# Patient Record
Sex: Male | Born: 1950 | Race: White | Hispanic: No | Marital: Married | Smoking: Former smoker
Health system: Southern US, Community
[De-identification: ages and names within clinical notes are randomized; demographics above are authoritative.]

## PROBLEM LIST (undated history)

## (undated) DIAGNOSIS — G4730 Sleep apnea, unspecified: Secondary | ICD-10-CM

## (undated) DIAGNOSIS — D490 Neoplasm of unspecified behavior of digestive system: Secondary | ICD-10-CM

## (undated) DIAGNOSIS — Z9889 Other specified postprocedural states: Secondary | ICD-10-CM

## (undated) DIAGNOSIS — M1990 Unspecified osteoarthritis, unspecified site: Secondary | ICD-10-CM

## (undated) DIAGNOSIS — I10 Essential (primary) hypertension: Secondary | ICD-10-CM

## (undated) DIAGNOSIS — R51 Headache: Secondary | ICD-10-CM

## (undated) DIAGNOSIS — C801 Malignant (primary) neoplasm, unspecified: Secondary | ICD-10-CM

## (undated) DIAGNOSIS — R112 Nausea with vomiting, unspecified: Secondary | ICD-10-CM

## (undated) DIAGNOSIS — Z8719 Personal history of other diseases of the digestive system: Secondary | ICD-10-CM

## (undated) DIAGNOSIS — B029 Zoster without complications: Secondary | ICD-10-CM

## (undated) DIAGNOSIS — F419 Anxiety disorder, unspecified: Secondary | ICD-10-CM

## (undated) DIAGNOSIS — IMO0001 Reserved for inherently not codable concepts without codable children: Secondary | ICD-10-CM

## (undated) DIAGNOSIS — Z5189 Encounter for other specified aftercare: Secondary | ICD-10-CM

## (undated) DIAGNOSIS — E7800 Pure hypercholesterolemia, unspecified: Secondary | ICD-10-CM

## (undated) DIAGNOSIS — I719 Aortic aneurysm of unspecified site, without rupture: Secondary | ICD-10-CM

## (undated) DIAGNOSIS — K2270 Barrett's esophagus without dysplasia: Secondary | ICD-10-CM

## (undated) DIAGNOSIS — I2510 Atherosclerotic heart disease of native coronary artery without angina pectoris: Secondary | ICD-10-CM

## (undated) DIAGNOSIS — K219 Gastro-esophageal reflux disease without esophagitis: Secondary | ICD-10-CM

## (undated) HISTORY — PX: REVISION TOTAL HIP ARTHROPLASTY: SHX766

## (undated) HISTORY — PX: ABDOMINAL SURGERY: SHX537

## (undated) HISTORY — PX: CHEST SURGERY: SHX595

## (undated) HISTORY — PX: SHOULDER ARTHROSCOPY: SHX128

## (undated) HISTORY — PX: MELANOMA EXCISION: SHX5266

## (undated) HISTORY — PX: KNEE ARTHROSCOPY: SUR90

## (undated) HISTORY — PX: HERNIA REPAIR: SHX51

## (undated) HISTORY — PX: LEG SURGERY: SHX1003

## (undated) HISTORY — PX: THYROID LOBECTOMY: SHX420

## (undated) HISTORY — PX: HIATAL HERNIA REPAIR: SHX195

## (undated) HISTORY — PX: SPINAL CORD STIMULATOR IMPLANT: SHX2422

## (undated) HISTORY — PX: BACK SURGERY: SHX140

## (undated) HISTORY — PX: LITHOTRIPSY: SUR834

## (undated) HISTORY — PX: HEMORRHOID SURGERY: SHX153

## (undated) HISTORY — PX: OTHER SURGICAL HISTORY: SHX169

## (undated) HISTORY — PX: KNEE ARTHROPLASTY: SHX992

## (undated) HISTORY — PX: JOINT REPLACEMENT: SHX530

---

## 2000-08-13 ENCOUNTER — Inpatient Hospital Stay (HOSPITAL_COMMUNITY): Admission: RE | Admit: 2000-08-13 | Discharge: 2000-08-17 | Payer: Self-pay | Admitting: Neurosurgery

## 2000-09-18 ENCOUNTER — Encounter: Admission: RE | Admit: 2000-09-18 | Discharge: 2000-09-18 | Payer: Self-pay | Admitting: Neurosurgery

## 2000-11-20 ENCOUNTER — Encounter: Admission: RE | Admit: 2000-11-20 | Discharge: 2000-11-20 | Payer: Self-pay | Admitting: Neurosurgery

## 2001-02-23 ENCOUNTER — Encounter: Admission: RE | Admit: 2001-02-23 | Discharge: 2001-02-23 | Payer: Self-pay | Admitting: Neurosurgery

## 2001-03-08 ENCOUNTER — Ambulatory Visit (HOSPITAL_COMMUNITY): Admission: RE | Admit: 2001-03-08 | Discharge: 2001-03-08 | Payer: Self-pay | Admitting: Neurosurgery

## 2001-04-18 ENCOUNTER — Emergency Department (HOSPITAL_COMMUNITY): Admission: EM | Admit: 2001-04-18 | Discharge: 2001-04-18 | Payer: Self-pay | Admitting: Emergency Medicine

## 2001-09-06 ENCOUNTER — Ambulatory Visit (HOSPITAL_COMMUNITY): Admission: RE | Admit: 2001-09-06 | Discharge: 2001-09-06 | Payer: Self-pay | Admitting: Internal Medicine

## 2002-10-07 ENCOUNTER — Inpatient Hospital Stay (HOSPITAL_COMMUNITY): Admission: AC | Admit: 2002-10-07 | Discharge: 2002-10-19 | Payer: Self-pay

## 2002-10-07 ENCOUNTER — Encounter: Payer: Self-pay | Admitting: *Deleted

## 2002-10-08 ENCOUNTER — Encounter: Payer: Self-pay | Admitting: Family Medicine

## 2002-10-08 ENCOUNTER — Encounter: Payer: Self-pay | Admitting: General Surgery

## 2002-10-08 ENCOUNTER — Encounter: Payer: Self-pay | Admitting: Psychology

## 2002-10-09 ENCOUNTER — Encounter: Payer: Self-pay | Admitting: General Surgery

## 2004-04-03 ENCOUNTER — Ambulatory Visit: Payer: Self-pay | Admitting: Internal Medicine

## 2004-04-03 ENCOUNTER — Ambulatory Visit (HOSPITAL_COMMUNITY): Admission: RE | Admit: 2004-04-03 | Discharge: 2004-04-03 | Payer: Self-pay | Admitting: Internal Medicine

## 2004-08-06 ENCOUNTER — Ambulatory Visit (HOSPITAL_COMMUNITY): Admission: RE | Admit: 2004-08-06 | Discharge: 2004-08-06 | Payer: Self-pay | Admitting: Family Medicine

## 2004-10-31 ENCOUNTER — Inpatient Hospital Stay (HOSPITAL_COMMUNITY): Admission: RE | Admit: 2004-10-31 | Discharge: 2004-11-05 | Payer: Self-pay | Admitting: Neurosurgery

## 2005-04-29 ENCOUNTER — Encounter (HOSPITAL_COMMUNITY): Admission: RE | Admit: 2005-04-29 | Discharge: 2005-05-29 | Payer: Self-pay | Admitting: Neurosurgery

## 2005-06-02 DIAGNOSIS — C801 Malignant (primary) neoplasm, unspecified: Secondary | ICD-10-CM

## 2005-06-02 HISTORY — DX: Malignant (primary) neoplasm, unspecified: C80.1

## 2005-06-04 ENCOUNTER — Encounter (HOSPITAL_COMMUNITY): Admission: RE | Admit: 2005-06-04 | Discharge: 2005-07-04 | Payer: Self-pay | Admitting: Neurosurgery

## 2005-07-24 ENCOUNTER — Ambulatory Visit: Payer: Self-pay | Admitting: Internal Medicine

## 2005-08-15 ENCOUNTER — Encounter (INDEPENDENT_AMBULATORY_CARE_PROVIDER_SITE_OTHER): Payer: Self-pay | Admitting: *Deleted

## 2005-08-15 ENCOUNTER — Ambulatory Visit: Payer: Self-pay | Admitting: Internal Medicine

## 2005-08-15 ENCOUNTER — Ambulatory Visit (HOSPITAL_COMMUNITY): Admission: RE | Admit: 2005-08-15 | Discharge: 2005-08-15 | Payer: Self-pay | Admitting: Internal Medicine

## 2005-09-26 ENCOUNTER — Ambulatory Visit (HOSPITAL_COMMUNITY): Admission: RE | Admit: 2005-09-26 | Discharge: 2005-09-26 | Payer: Self-pay | Admitting: Thoracic Surgery

## 2005-10-01 ENCOUNTER — Ambulatory Visit (HOSPITAL_COMMUNITY): Admission: RE | Admit: 2005-10-01 | Discharge: 2005-10-01 | Payer: Self-pay | Admitting: Thoracic Surgery

## 2005-10-21 ENCOUNTER — Inpatient Hospital Stay (HOSPITAL_COMMUNITY): Admission: RE | Admit: 2005-10-21 | Discharge: 2005-10-25 | Payer: Self-pay | Admitting: Thoracic Surgery

## 2005-10-21 ENCOUNTER — Encounter (INDEPENDENT_AMBULATORY_CARE_PROVIDER_SITE_OTHER): Payer: Self-pay | Admitting: Specialist

## 2005-11-05 ENCOUNTER — Encounter: Admission: RE | Admit: 2005-11-05 | Discharge: 2005-11-05 | Payer: Self-pay | Admitting: Thoracic Surgery

## 2005-11-19 ENCOUNTER — Encounter: Admission: RE | Admit: 2005-11-19 | Discharge: 2005-11-19 | Payer: Self-pay | Admitting: Thoracic Surgery

## 2005-12-18 ENCOUNTER — Inpatient Hospital Stay (HOSPITAL_COMMUNITY): Admission: RE | Admit: 2005-12-18 | Discharge: 2005-12-22 | Payer: Self-pay | Admitting: Neurosurgery

## 2006-02-17 ENCOUNTER — Encounter: Admission: RE | Admit: 2006-02-17 | Discharge: 2006-02-17 | Payer: Self-pay | Admitting: Thoracic Surgery

## 2006-03-11 ENCOUNTER — Ambulatory Visit: Payer: Self-pay | Admitting: Internal Medicine

## 2006-04-30 ENCOUNTER — Ambulatory Visit: Payer: Self-pay | Admitting: Internal Medicine

## 2006-05-01 ENCOUNTER — Ambulatory Visit (HOSPITAL_COMMUNITY): Admission: RE | Admit: 2006-05-01 | Discharge: 2006-05-01 | Payer: Self-pay | Admitting: Neurosurgery

## 2006-08-03 ENCOUNTER — Inpatient Hospital Stay (HOSPITAL_COMMUNITY): Admission: RE | Admit: 2006-08-03 | Discharge: 2006-08-05 | Payer: Self-pay | Admitting: Orthopedic Surgery

## 2007-03-20 ENCOUNTER — Ambulatory Visit (HOSPITAL_COMMUNITY): Admission: RE | Admit: 2007-03-20 | Discharge: 2007-03-20 | Payer: Self-pay | Admitting: Neurosurgery

## 2007-04-04 ENCOUNTER — Encounter: Admission: RE | Admit: 2007-04-04 | Discharge: 2007-04-04 | Payer: Self-pay | Admitting: Neurosurgery

## 2007-05-04 ENCOUNTER — Encounter (HOSPITAL_COMMUNITY): Admission: RE | Admit: 2007-05-04 | Discharge: 2007-06-02 | Payer: Self-pay | Admitting: Neurosurgery

## 2007-05-17 ENCOUNTER — Ambulatory Visit (HOSPITAL_COMMUNITY): Admission: RE | Admit: 2007-05-17 | Discharge: 2007-05-17 | Payer: Self-pay | Admitting: Neurosurgery

## 2007-06-01 ENCOUNTER — Ambulatory Visit: Payer: Self-pay | Admitting: Thoracic Surgery

## 2007-06-01 ENCOUNTER — Encounter: Admission: RE | Admit: 2007-06-01 | Discharge: 2007-06-01 | Payer: Self-pay | Admitting: Thoracic Surgery

## 2007-07-07 ENCOUNTER — Ambulatory Visit (HOSPITAL_COMMUNITY): Admission: RE | Admit: 2007-07-07 | Discharge: 2007-07-12 | Payer: Self-pay | Admitting: Neurosurgery

## 2007-07-21 ENCOUNTER — Ambulatory Visit: Payer: Self-pay | Admitting: Thoracic Surgery

## 2007-08-25 ENCOUNTER — Ambulatory Visit: Payer: Self-pay | Admitting: Thoracic Surgery

## 2007-09-06 ENCOUNTER — Ambulatory Visit: Payer: Self-pay | Admitting: Thoracic Surgery

## 2007-09-06 ENCOUNTER — Inpatient Hospital Stay (HOSPITAL_COMMUNITY): Admission: RE | Admit: 2007-09-06 | Discharge: 2007-09-09 | Payer: Self-pay | Admitting: Thoracic Surgery

## 2007-09-16 ENCOUNTER — Ambulatory Visit: Payer: Self-pay | Admitting: Thoracic Surgery

## 2007-09-16 ENCOUNTER — Encounter: Admission: RE | Admit: 2007-09-16 | Discharge: 2007-09-16 | Payer: Self-pay | Admitting: Thoracic Surgery

## 2007-09-28 ENCOUNTER — Encounter: Admission: RE | Admit: 2007-09-28 | Discharge: 2007-09-28 | Payer: Self-pay | Admitting: Thoracic Surgery

## 2007-09-28 ENCOUNTER — Ambulatory Visit: Payer: Self-pay | Admitting: Thoracic Surgery

## 2007-10-29 ENCOUNTER — Emergency Department (HOSPITAL_COMMUNITY): Admission: EM | Admit: 2007-10-29 | Discharge: 2007-10-29 | Payer: Self-pay | Admitting: Emergency Medicine

## 2007-11-03 ENCOUNTER — Ambulatory Visit (HOSPITAL_COMMUNITY): Admission: RE | Admit: 2007-11-03 | Discharge: 2007-11-03 | Payer: Self-pay | Admitting: Urology

## 2007-11-05 ENCOUNTER — Ambulatory Visit (HOSPITAL_COMMUNITY): Admission: RE | Admit: 2007-11-05 | Discharge: 2007-11-05 | Payer: Self-pay | Admitting: Urology

## 2007-11-18 ENCOUNTER — Ambulatory Visit (HOSPITAL_COMMUNITY): Admission: RE | Admit: 2007-11-18 | Discharge: 2007-11-18 | Payer: Self-pay | Admitting: Urology

## 2007-12-02 ENCOUNTER — Ambulatory Visit: Payer: Self-pay | Admitting: Thoracic Surgery

## 2007-12-02 ENCOUNTER — Encounter: Admission: RE | Admit: 2007-12-02 | Discharge: 2007-12-02 | Payer: Self-pay | Admitting: Thoracic Surgery

## 2007-12-20 ENCOUNTER — Ambulatory Visit (HOSPITAL_COMMUNITY): Admission: RE | Admit: 2007-12-20 | Discharge: 2007-12-20 | Payer: Self-pay | Admitting: Urology

## 2009-03-18 ENCOUNTER — Encounter: Admission: RE | Admit: 2009-03-18 | Discharge: 2009-03-18 | Payer: Self-pay | Admitting: Orthopedic Surgery

## 2009-10-03 ENCOUNTER — Encounter: Payer: Self-pay | Admitting: Gastroenterology

## 2009-10-25 ENCOUNTER — Ambulatory Visit: Payer: Self-pay | Admitting: Thoracic Surgery

## 2010-03-21 ENCOUNTER — Ambulatory Visit (HOSPITAL_COMMUNITY): Admission: RE | Admit: 2010-03-21 | Discharge: 2010-03-22 | Payer: Self-pay | Admitting: Neurosurgery

## 2010-03-28 ENCOUNTER — Ambulatory Visit (HOSPITAL_COMMUNITY)
Admission: RE | Admit: 2010-03-28 | Discharge: 2010-03-29 | Payer: Self-pay | Source: Home / Self Care | Admitting: Neurosurgery

## 2010-03-31 ENCOUNTER — Ambulatory Visit: Payer: Self-pay | Admitting: Internal Medicine

## 2010-03-31 ENCOUNTER — Inpatient Hospital Stay (HOSPITAL_COMMUNITY)
Admission: EM | Admit: 2010-03-31 | Discharge: 2010-04-02 | Payer: Self-pay | Source: Home / Self Care | Admitting: Emergency Medicine

## 2010-06-02 HISTORY — PX: NECK SURGERY: SHX720

## 2010-06-23 ENCOUNTER — Encounter: Payer: Self-pay | Admitting: Neurosurgery

## 2010-06-23 ENCOUNTER — Encounter: Payer: Self-pay | Admitting: Thoracic Surgery

## 2010-06-24 ENCOUNTER — Encounter: Payer: Self-pay | Admitting: Thoracic Surgery

## 2010-07-04 NOTE — Letter (Signed)
Summary: Internal Other  Internal Other   Imported By: Peggyann Shoals 10/03/2009 10:22:03  _____________________________________________________________________  External Attachment:    Type:   Image     Comment:   External Document

## 2010-07-28 ENCOUNTER — Emergency Department (HOSPITAL_COMMUNITY): Payer: Medicare Other

## 2010-07-28 ENCOUNTER — Emergency Department (HOSPITAL_COMMUNITY)
Admission: EM | Admit: 2010-07-28 | Discharge: 2010-07-28 | Disposition: A | Discharge status: Home / Self Care | Payer: Medicare Other | Attending: Emergency Medicine | Admitting: Emergency Medicine

## 2010-07-28 DIAGNOSIS — Z79899 Other long term (current) drug therapy: Secondary | ICD-10-CM | POA: Insufficient documentation

## 2010-07-28 DIAGNOSIS — R0789 Other chest pain: Secondary | ICD-10-CM | POA: Insufficient documentation

## 2010-07-28 DIAGNOSIS — R112 Nausea with vomiting, unspecified: Secondary | ICD-10-CM | POA: Insufficient documentation

## 2010-07-28 DIAGNOSIS — I10 Essential (primary) hypertension: Secondary | ICD-10-CM | POA: Insufficient documentation

## 2010-07-28 DIAGNOSIS — R109 Unspecified abdominal pain: Secondary | ICD-10-CM | POA: Insufficient documentation

## 2010-07-28 LAB — DIFFERENTIAL
Basophils Absolute: 0 10*3/uL (ref 0.0–0.1)
Basophils Relative: 0 % (ref 0–1)
Eosinophils Absolute: 0.1 10*3/uL (ref 0.0–0.7)
Eosinophils Relative: 1 % (ref 0–5)
Lymphocytes Relative: 21 % (ref 12–46)
Lymphs Abs: 2.2 10*3/uL (ref 0.7–4.0)
Monocytes Absolute: 0.8 10*3/uL (ref 0.1–1.0)
Monocytes Relative: 8 % (ref 3–12)
Neutro Abs: 7.5 10*3/uL (ref 1.7–7.7)
Neutrophils Relative %: 71 % (ref 43–77)

## 2010-07-28 LAB — POCT CARDIAC MARKERS
CKMB, poc: 1.3 ng/mL (ref 1.0–8.0)
CKMB, poc: 2.2 ng/mL (ref 1.0–8.0)
CKMB, poc: 1.1 ng/mL (ref 1.0–8.0)
Myoglobin, poc: 131 ng/mL (ref 12–200)
Myoglobin, poc: 153 ng/mL (ref 12–200)
Myoglobin, poc: 122 ng/mL (ref 12–200)
Troponin i, poc: 0.08 ng/mL (ref 0.00–0.09)
Troponin i, poc: <0.05 ng/mL (ref 0.00–0.09)
Troponin i, poc: <0.05 ng/mL (ref 0.00–0.09)

## 2010-07-28 LAB — BASIC METABOLIC PANEL
BUN: 12 mg/dL (ref 6–23)
CO2: 26 mEq/L (ref 19–32)
Calcium: 9.4 mg/dL (ref 8.4–10.5)
Chloride: 103 mEq/L (ref 96–112)
Creatinine, Ser: 1.18 mg/dL (ref 0.4–1.5)
GFR calc Af Amer: >60 mL/min (ref >60)
GFR calc non Af Amer: >60 mL/min (ref >60)
Glucose, Bld: 108 mg/dL — ABNORMAL HIGH (ref 70–99)
Potassium: 4 mEq/L (ref 3.5–5.1)
Sodium: 138 mEq/L (ref 135–145)

## 2010-07-28 LAB — CBC
HCT: 45.8 % (ref 39.0–52.0)
Hemoglobin: 15.2 g/dL (ref 13.0–17.0)
MCH: 29.5 pg (ref 26.0–34.0)
MCHC: 33.2 g/dL (ref 30.0–36.0)
MCV: 88.9 fL (ref 78.0–100.0)
Platelets: 323 10*3/uL (ref 150–400)
RBC: 5.15 MIL/uL (ref 4.22–5.81)
RDW: 13.9 % (ref 11.5–15.5)
WBC: 10.6 10*3/uL — ABNORMAL HIGH (ref 4.0–10.5)

## 2010-08-14 LAB — BASIC METABOLIC PANEL
BUN: 13 mg/dL (ref 6–23)
BUN: 15 mg/dL (ref 6–23)
BUN: 16 mg/dL (ref 6–23)
BUN: 24 mg/dL — ABNORMAL HIGH (ref 6–23)
CO2: 25 mEq/L (ref 19–32)
CO2: 27 mEq/L (ref 19–32)
CO2: 27 mEq/L (ref 19–32)
CO2: 29 mEq/L (ref 19–32)
Calcium: 8.6 mg/dL (ref 8.4–10.5)
Calcium: 9.1 mg/dL (ref 8.4–10.5)
Calcium: 9.3 mg/dL (ref 8.4–10.5)
Calcium: 9.4 mg/dL (ref 8.4–10.5)
Chloride: 105 mEq/L (ref 96–112)
Chloride: 105 mEq/L (ref 96–112)
Chloride: 109 mEq/L (ref 96–112)
Chloride: 110 mEq/L (ref 96–112)
Creatinine, Ser: 0.91 mg/dL (ref 0.4–1.5)
Creatinine, Ser: 0.99 mg/dL (ref 0.4–1.5)
Creatinine, Ser: 1.09 mg/dL (ref 0.4–1.5)
Creatinine, Ser: 1.24 mg/dL (ref 0.4–1.5)
GFR calc Af Amer: >60 mL/min (ref >60)
GFR calc Af Amer: >60 mL/min (ref >60)
GFR calc Af Amer: >60 mL/min (ref >60)
GFR calc Af Amer: >60 mL/min (ref >60)
GFR calc non Af Amer: 60 mL/min — ABNORMAL LOW (ref >60)
GFR calc non Af Amer: >60 mL/min (ref >60)
GFR calc non Af Amer: >60 mL/min (ref >60)
GFR calc non Af Amer: >60 mL/min (ref >60)
Glucose, Bld: 117 mg/dL — ABNORMAL HIGH (ref 70–99)
Glucose, Bld: 89 mg/dL (ref 70–99)
Glucose, Bld: 92 mg/dL (ref 70–99)
Glucose, Bld: 94 mg/dL (ref 70–99)
Potassium: 3.7 mEq/L (ref 3.5–5.1)
Potassium: 3.8 mEq/L (ref 3.5–5.1)
Potassium: 4.4 mEq/L (ref 3.5–5.1)
Potassium: 4.5 mEq/L (ref 3.5–5.1)
Sodium: 139 mEq/L (ref 135–145)
Sodium: 140 mEq/L (ref 135–145)
Sodium: 141 mEq/L (ref 135–145)
Sodium: 141 mEq/L (ref 135–145)

## 2010-08-14 LAB — CBC
HCT: 39.2 % (ref 39.0–52.0)
HCT: 45 % (ref 39.0–52.0)
HCT: 45.3 % (ref 39.0–52.0)
HCT: 46.1 % (ref 39.0–52.0)
Hemoglobin: 12.6 g/dL — ABNORMAL LOW (ref 13.0–17.0)
Hemoglobin: 15.1 g/dL (ref 13.0–17.0)
Hemoglobin: 15.4 g/dL (ref 13.0–17.0)
Hemoglobin: 15.7 g/dL (ref 13.0–17.0)
MCH: 29.6 pg (ref 26.0–34.0)
MCH: 30.3 pg (ref 26.0–34.0)
MCH: 30.6 pg (ref 26.0–34.0)
MCH: 31.1 pg (ref 26.0–34.0)
MCHC: 32.7 g/dL (ref 30.0–36.0)
MCHC: 33.6 g/dL (ref 30.0–36.0)
MCHC: 34 g/dL (ref 30.0–36.0)
MCHC: 34.1 g/dL (ref 30.0–36.0)
MCV: 89.9 fL (ref 78.0–100.0)
MCV: 90.2 fL (ref 78.0–100.0)
MCV: 90.5 fL (ref 78.0–100.0)
MCV: 91.3 fL (ref 78.0–100.0)
Platelets: 211 10*3/uL (ref 150–400)
Platelets: 218 10*3/uL (ref 150–400)
Platelets: 218 10*3/uL (ref 150–400)
Platelets: 221 10*3/uL (ref 150–400)
RBC: 4.33 MIL/uL (ref 4.22–5.81)
RBC: 4.99 MIL/uL (ref 4.22–5.81)
RBC: 5.04 MIL/uL (ref 4.22–5.81)
RBC: 5.05 MIL/uL (ref 4.22–5.81)
RDW: 13.3 % (ref 11.5–15.5)
RDW: 13.4 % (ref 11.5–15.5)
RDW: 13.5 % (ref 11.5–15.5)
RDW: 13.6 % (ref 11.5–15.5)
WBC: 10 10*3/uL (ref 4.0–10.5)
WBC: 8.5 10*3/uL (ref 4.0–10.5)
WBC: 8.6 10*3/uL (ref 4.0–10.5)
WBC: 9.7 10*3/uL (ref 4.0–10.5)

## 2010-08-14 LAB — DIFFERENTIAL
Basophils Absolute: 0 10*3/uL (ref 0.0–0.1)
Basophils Relative: 0 % (ref 0–1)
Eosinophils Absolute: 0.1 10*3/uL (ref 0.0–0.7)
Eosinophils Relative: 1 % (ref 0–5)
Lymphocytes Relative: 21 % (ref 12–46)
Lymphs Abs: 2.1 10*3/uL (ref 0.7–4.0)
Monocytes Absolute: 0.9 10*3/uL (ref 0.1–1.0)
Monocytes Relative: 10 % (ref 3–12)
Neutro Abs: 6.5 10*3/uL (ref 1.7–7.7)
Neutrophils Relative %: 67 % (ref 43–77)

## 2010-08-14 LAB — SURGICAL PCR SCREEN
MRSA, PCR: NEGATIVE
Staphylococcus aureus: NEGATIVE

## 2010-08-14 LAB — D-DIMER, QUANTITATIVE: D-Dimer, Quant: 0.96 ug/mL-FEU — ABNORMAL HIGH (ref 0.00–0.48)

## 2010-08-14 LAB — LIPASE, BLOOD: Lipase: 30 U/L (ref 11–59)
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
## 2010-10-15 NOTE — Assessment & Plan Note (Signed)
OFFICE VISIT   [name], [name]  DOB:  [birth year]                                        June 01, 2007  CHART #:  [id number]   [name] came today and was concerned about a questionable incisional  hernia in his thoracotomy incision.  On examination over the triangle of  auscultation, when he coughs you can see some bulging of the soft  tissues, so he probably has some separations of the rib in this area but  I assured him that this does not seem to be a major problem and nothing  would need to be done now or probably in the future.  His chest x-ray  was stable.  It shows some old calcification of the ribs.  He has had  previous back surgery.  His blood pressure is 144/92, pulse 100,  respirations 18, saturations are 94%.  We will see him again in 3 months  just to check this again.   Ines Bloomer, M.D.  Electronically Signed   DPB/MEDQ  D:  06/01/2007  T:  06/01/2007  Job:  [id number]
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
## 2010-10-15 NOTE — Assessment & Plan Note (Signed)
OFFICE VISIT   [name], [name]  DOB:  Oct 12, 1950                                        September 28, 2007  CHART #:  [id number]   Mr. Gaster came for followup today. His incision is well healed.   His blood pressure is 131/82, pulse 87, respirations 18, saturations  93%.  Lungs were clear to auscultation and percussion. There was no  evidence of recurrence of his lung hernia.   He will be seeing Dr. Karilyn Cota regarding his chronic diarrhea problem.  From my standpoint, he is doing well. I will see him back again in two  months with a chest x-ray.   Ines Bloomer, M.D.  Electronically Signed   DPB/MEDQ  D:  09/28/2007  T:  09/28/2007  Job:  [id number]
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
## 2010-10-15 NOTE — Assessment & Plan Note (Signed)
OFFICE VISIT   LUX, MEADERS  DOB:  02-Jan-1951                                        September 16, 2007  CHART #:  [id number]   Mr. Hantz returns today for followup.  He is status post repair of a  left incisional lung hernia on September 06, 2007.  He had an uneventful  postoperative course and was discharged home on postop day #4.  Since  returning home, however, he has developed significant abdominal  cramping, bloating and diarrhea.  He has also had some associated nausea  and vomiting, which has been so bad that he sought attention at Dr.  Inge Rise office, where they gave him some type of injection for the  nausea.  He has not had fever.  He has had problems with GI troubles in  the past and has been seen by Dr. Dionicia Abler and apparently is awaiting a  call from their office to schedule an  appointment for evaluation of the  current situation.  From a pulmonary standpoint, he is doing very well.  His breathing is stable and he has had very little incisional  discomfort.   PHYSICAL EXAMINATION:  VITAL SIGNS:  Blood pressure 98/60, respirations  18, O2 sat 94%.  SKIN:  His left incision is healing well with no evidence of recurrence  of his hernia.  LUNGS:  Clear to auscultation.  ABDOMEN:  Soft with active bowel sounds.  He is somewhat distended.   Chest x-ray shows no acute changes.   ASSESSMENT/PLAN:  Mr. Southgate is doing well status post a left incisional  lung hernia repair.  He was seen by Dr. Edwyna Shell today and in light of his  current GI symptoms was given a prescription for 10 days' worth of  Flagyl to cover for C. diff.  He is asked to follow up as  scheduled with Dr. Dionicia Abler, and we will see him back here in our office in  2 weeks with a chest x-ray.   Ines Bloomer, M.D.  Electronically Signed   GC/MEDQ  D:  09/16/2007  T:  09/16/2007  Job:  [id number]   cc:   Dr. Jennelle Human, M.D.

## 2010-10-15 NOTE — Assessment & Plan Note (Signed)
OFFICE VISIT   CAMP, GOPAL  DOB:  10/09/1950                                        August 25, 2007  CHART #:  [id number]   [name] came today and it looks like his lung hernia is the same or  possibly increased in size.  His blood pressure is 132/93, pulse 83,  respirations 18, sats were 96%.  He says when he has nausea or vomiting  or coughs from his sinsuses, it causes him to have a lot of pain.  He  has also had 5 previous back surgeries and is apparently applying for  disability.   On examination there is a definite herniation between the ribs right  near the triangle of auscultation.  Because he is symptomatic and would  like to have this repaired, we will schedule this for September 06, 2007.  Will reopen the incision and stabilize the ribs and probably close the  hernia with some type of Gore-Tex or absorbable material.   Ines Bloomer, M.D.  Electronically Signed   DPB/MEDQ  D:  08/25/2007  T:  08/25/2007  Job:  [id number]

## 2010-10-15 NOTE — Op Note (Signed)
[name], [name] NO.:  [id number]   MEDICAL RECORD NO.:  [id number]          PATIENT TYPE:  INP   LOCATION:  2550                         FACILITY:  [facility]   PHYSICIAN:  Ines Bloomer, M.D. DATE OF BIRTH:  [birth year]   DATE OF PROCEDURE:  DATE OF DISCHARGE:                               OPERATIVE REPORT   PREOPERATIVE DIAGNOSIS:  Left incisional lung hernia   POSTOPERATIVE DIAGNOSIS:  Left incisional lung hernia   OPERATION PERFORMED:  Repair of the left incisional lung hernia.   SURGEON:  Dr. Patricia Nettle. Burney.   FIRST ASSISTANT:  Kristen Dominick PA-C.   ANESTHESIA:  General anesthesia.   DESCRIPTION OF PROCEDURE:  After general anesthesia, the patient was  turned to the left lateral thoracotomy position and prepped and draped  in the usual sterile manner.  The previous thoracotomy incision was  opened, approximately two-thirds of it in the middle portion of it and  dissection was carried down to the latissimus, which was very thinned  out and it was divided.  The serratus was reflected anteriorly entering  into the fifth intercostal space which was widened, and you could see  the obvious the lung hernia there.  The space was about 3 cm.  It was  widened about 3 cm, so when the patient coughed you could see the hernia  easily.  We dissected up the excess tissue from the hernia sac and then  closed the intercostal space with #2 Prolene using six of them in an  interrupted fashion.  We use of Bailey rib approximator to reapproximate  the ribs and this closed the interspace down from 3 cm down to about  three-fourths to 1 cm in length in size.  Then, we reinforced this  interspace with a 1 mm Gore-Tex patch placed in the area of the hernia  and sutured it to the intercostal muscle superiorly and inferiorly with  0 Prolene in an interrupted fashion.  After this had been done, the  triangle of auscultation was closed with interrupted #1 Vicryls and then  the  platysmas was reapproximated with interrupted #1 Vicryl, 2-0 Vicryl  in the subcutaneous tissue and Dermabond for the skin.  The patient  returned to the recovery room in stable condition.      Ines Bloomer, M.D.  Electronically Signed     DPB/MEDQ  D:  09/06/2007  T:  09/06/2007  Job:  [id number]

## 2010-10-15 NOTE — H&P (Signed)
[name], [name]                ACCOUNT NO.:  [id number]   MEDICAL RECORD NO.:  [id number]          PATIENT TYPE:  AMB   LOCATION:  DAY                           FACILITY:  [facility]   PHYSICIAN:  Dennie Maizes, M.D.   DATE OF BIRTH:  [birth year]   DATE OF ADMISSION:  11/03/2007  DATE OF DISCHARGE:  LH                              HISTORY & PHYSICAL   CHIEF COMPLAINT:  Severe right flank pain radiating to the front, right  distal ureteral calculus with obstruction.   HISTORY OF PRESENT ILLNESS:  This 60 year old male has a history of  urolithiasis in the past.  He had right flank pain about 4 years ago.  He experienced onset of severe right flank pain radiating to the front  about a week ago.  He went to the emergency room at [facility]  on Oct 29, 2007.  Evaluation was done with a noncontrast CT scan of the  abdomen and pelvis.  This revealed a small liver hemangioma.  The  patient had mild hydronephrosis due to an obstructing stone in the  distal ureter, measuring 10 x 6 x 6 mm in size.  The patient is unable  to pass the stone.   He denied having any fever, chills, voiding difficulty or hematuria at  present.  He was brought to the short-stay center today for  extracorporeal shock wave lithotripsy of the right distal ureteral  calculus, which is causing obstruction.   PAST MEDICAL HISTORY:  1. History of urolithiasis.  2. Gastroesophageal reflux disease.  3. Status post fundoplication for hiatal hernia on several occasions.  4. Status post right thyroidectomy.  5. Status post multiple back surgeries.  6. History of Barrett's esophagus.  7. History of excision of melanoma.  8. Status post multiple neck surgeries.  9. Status post total knee replacement.  10.Status post incisional hernia repair of the chest.   MEDICATIONS:  1. Vitamin B.  2. Calcium with vitamin D.  3. Prevacid 30 mg two twice a day.  4. Singulair 10 mg p.o. daily.  5. Lisinopril 10 mg p.o.  daily.  6. Allegra 180 mg p.o. daily.  7. Dicyclomine 20 mg three times a day.  8. Tylox one or two p.r.n. for back pain.  9. Diazepam 5 mg one p.o. q.8 h. p.r.n. muscle spasms.  10.Metoclopramide 10 mg for nausea.  11.Amoxicillin 500 mg q.6 h. for prophylaxis for dental work.   ALLERGIES:  DEMEROL.   FAMILY HISTORY:  Positive for kidney stones, heart disease, COPD, hiatal  hernia, heart transplant.   EXAMINATION:  Height 6 foot 3 inches.  Weight 240 pounds.  HEENT:  Normal.  NECK:  No masses.  LUNGS:  Clear to auscultation.  HEART:  Regular rate and rhythm.  No murmurs.  ABDOMEN:  Soft.  No palpable flank mass.  Mild right costovertebral  angle tenderness is noted.  GU:  Penis and testes normal.  RECTAL:  Examination not done.   IMPRESSION:  Right distal ureteral calculus with obstruction, with about  10 x 6 x 6 mm right hydronephrosis, right renal colic.  PLAN:  Extracorporeal shockwave lithotripsy of  right distal ureteral  calculus with IV sedation in the [facility].  I discussed with  the patient and his wife regarding the diagnosis, operative details,  alternative treatments, outcome, possible risks and complications; and  he has agreed for the procedure to be done.      Dennie Maizes, M.D.  Electronically Signed     SK/MEDQ  D:  11/02/2007  T:  11/03/2007  Job:  [id number]   cc:   Kirk Ruths, M.D.  Fax: [phone]

## 2010-10-15 NOTE — Assessment & Plan Note (Signed)
OFFICE VISIT   [name], [name]  DOB:  13-Aug-1950                                        December 02, 2007  CHART #:  [id number]   The patient came for followup today.  He states he thought he had  another incisional hernia.  When we examined his wound, there was no  evidence of any hernia. When he does cough, there is some ballooning of  the soft tissues, but nothing like there is a recurrence of his hernia.  I did not palpate any separation in his ribs.  His blood pressure is  126/86, pulse 76, respirations 18, and sats were 95%.  Lungs were clear  to auscultation and percussion.  We will see him back again in 2 months  with another chest x-ray.   Ines Bloomer, M.Cochran.  Electronically Signed   DPB/MEDQ  Cochran:  12/02/2007  T:  12/02/2007  Job:  [id number]

## 2010-10-15 NOTE — Discharge Summary (Signed)
NAMECHRISTIAAN, STREBECK NO.:  [id number]   MEDICAL RECORD NO.:  [id number]          PATIENT TYPE:  INP   LOCATION:  3301                         FACILITY:  [facility]   PHYSICIAN:  Ines Bloomer, M.D. DATE OF BIRTH:  1951/03/29   DATE OF ADMISSION:  09/06/2007  DATE OF DISCHARGE:  09/09/2007                               DISCHARGE SUMMARY   The patient was considered to be ready for discharge home on September 08, 2007.  Unfortunately in the morning of September 08, 2007, postop day #2, the  patient was noted to complain of nausea and vomiting.  He does have a  history of nausea and vomiting, but that is worse today.  The patient  received Zofran as well as Reglan.  He was taken out of bed to ambulate  and was feeling better.  On postop day 2, the patient's nausea and  vomiting had improved.  His vital signs were stable.  The patient is  afebrile.  He is sating greater than 90% on room air.  He continues to  ambulate without difficulty.  He was tolerating his diet well.  His  incision are clean, dry, intact, and healing.   LABS:  Labs on postop day #3, white count 9.8, hemoglobin 13.7,  hematocrit 39.5, and platelet count 222.  Sodium of 130, potassium of  3.9, chloride of 104, bicarb of 27, BUN of 15, creatinine 1.10, and  glucose of 113.   The patient is felt to be stable and ready for discharge home on postop  day 3, September 09, 2007.  For details of the patient's followup appointment  and discharge instructions, please see dictated H and P.   DISCHARGE MEDICATIONS:  1. Prevacid 30 mg b.i.d.  2. Lisinopril 10 mg daily.  3. Allegra 180 mg daily.  4. Singulair 10 mg daily.  5. Fexofenadine 180 mg four times per day.  6. Diazepam 5 mg t.i.d. p.r.n.  7. Tylox 1-2 tablets q.4-6 h. p.r.n.  8. Promethazine p.r.n.  9. Calcium and vitamin D daily.  10.Phenergan Suppository one tablet every 8 hours p.r.n.  11.Phenergan 25 mg one tablet every 8 hours p.r.n.      Theda Belfast, [place]      Ines Bloomer, M.D.  Electronically Signed   KMD/MEDQ  D:  10/19/2007  T:  10/20/2007  Job:  [id number]   cc:   Ines Bloomer, M.D.

## 2010-10-18 NOTE — Op Note (Signed)
NAMESHANKAR, SILBER NO.:  [id number]   MEDICAL RECORD NO.:  [id number]          PATIENT TYPE:  INP   LOCATION:  5029                         FACILITY:  [facility]   PHYSICIAN:  Mila Homer. Sherlean Foot, M.D. DATE OF BIRTH:  03/24/1951   DATE OF PROCEDURE:  08/03/2006  DATE OF DISCHARGE:                               OPERATIVE REPORT   SURGEON:  Mila Homer. Sherlean Foot, M.D.   ASSISTANT:  Richardean Canal, P.A.   PREOPERATIVE DIAGNOSIS:  Left knee post-traumatic arthritis.   POSTOPERATIVE DIAGNOSIS:  Left knee post-traumatic arthritis.   PROCEDURE:  Left total knee arthroplasty with revision of components on  the tibia (modifier 22 due to extraordinary complication due to the  prior hardware and need for revision components).   ANESTHESIA:  General.   COMPLICATIONS:  None.   DRAINS:  1 Hemovac, 1 pain catheter.   ESTIMATED BLOOD LOSS:  200 mL.   TOURNIQUET TIME:  1 hour 25 minutes.   DESCRIPTION OF PROCEDURE:  Informed consent was obtained.  The patient  was laid supine and administered general anesthesia.  A Foley catheter  was placed.  His left leg was prepped and draped in the usual sterile  fashion.  Preoperative range of motion was 0 to 125.  A midline incision  was made 4 cm away from the lateral incision distally, and further away  as we went up, and it was basically a straight parapatellar incision.  I  used a fresh blade to make a median  parapatellar arthrotomy.  I  performed synovectomy and elevated the deep MCL off the medial rest of  the tibia without going town to the pes tendons.  I then everted the  patella.  It measured 26 mm thick.  I reamed down 9 mm and then drilled  3 lug holes and accepted a 35-mm button and recreated 24-mm thickness.  We then went into flexion and I used the extramedullary alignment system  to make a perpendicular cut to the tibia.  I then used the  intramedullary guide to make a distal femoral cut at 6 degrees.  Then, I  drew out  the epicondylar axis; it measured 3 degrees.  I sized for a  size F, then through the 3-degree external rotation holes, I pinned the  4-in-1 cutting block into place and made the anterior, posterior and  chamfer cuts.  I then removed the block and placed a 10-mm spacer block  in the knee and had good flexion and extension gap balance.  I then  finished the femur with a size-F finishing block, drilling for the lugs  and cutting for the box.  I then went into the tibia with the  intramedullary canal finder and reamed up to 15 mm.  I then cut because  there were depressed levels of the lateral plateau and I wanted to get  beneath that.  At this point, a 17 spacer block then gave good flexion  and extension gap balance.  I then finished the tibia with a size-6  tibial tray drilled keel and trialed with the revision component with  a  15-mm stem.  The primary component on the femur was a size F, 17-mm  poly, 35-mm patella, and we had good flexion and extension gap balance,  trialed from 90 to 125 degrees and chose these components.  I then  copiously irrigated.  I then cemented in the components and snapped the  old polyethylene, located the knee and allowed the cement to harden in  extension.  I then cemented in the patella as well and removed all  excess cement.  I placed a Hemovac coming out superolaterally and deep  to the arthrotomy, and a pain catheter coming out superomedially and  superficial to the arthrotomy.  I closed the arthrotomy with figure-of-  eight #1 Vicryl sutures after the tourniquet was let down and hemostasis  was obtained.  I then closed the deep soft tissues with interrupted 0  Vicryl, subcuticular 2-0 Vicryl stitches and skin staples, Xeroform,  dressing sponges, sterile Webril and TED stockings..           ______________________________  Mila Homer Sherlean Foot, M.D.     SDL/MEDQ  D:  08/03/2006  T:  08/03/2006  Job:  [id number]

## 2010-10-18 NOTE — Op Note (Signed)
[name], [name]                ACCOUNT NO.:  [id number]   MEDICAL RECORD NO.:  [id number]          PATIENT TYPE:  AMB   LOCATION:  DAY                           FACILITY:  [facility]   PHYSICIAN:  [name], M.D.    DATE OF BIRTH:  [birth year]   DATE OF PROCEDURE:  08/15/2005  DATE OF DISCHARGE:                                 OPERATIVE REPORT   PROCEDURE:  Esophagogastroduodenoscopy and possible placement of Bravo  device duodenoscopy   INDICATIONS:  [name] is 60 year old Caucasian male with chronic GERD and a  short-segment Barrett's esophagus whose symptoms have not been well  controlled. His prior entire reflux surgery has undone spontaneously. He is  undergoing both diagnostic EGD and also EGD for surveillance purposes. If  his esophageal mucosa does not show evidence of esophagitis, has  spontaneously undone. He is undergoing diagnostic/surveillance EGD. If he  does not have endoscopic evidence of reflux esophagitis, will place Bravo  device for pH monitoring. He has been off PPI for 7 days, although he has  been having intractable heartburn, nausea, and vomiting for the last 3 days.   Procedure and risks were reviewed with the patient and informed consent was  obtained. Procedure was performed under anesthesia as he is been intolerant  of conscious sedation.   PREOP/INTRAOPERATIVE MEDICINES:  Please see anesthesia record for details.   FINDINGS:  Procedure performed in OR. The patient was placed under  anesthesia and was intubated. He was left in supine position. Olympus  videoscope was passed via oropharynx without any difficulty into esophagus.   ESOPHAGUS:  Mucosa of proximal and middle segment was normal. Distally there  were 2 ulcers which were linear. Proximal margin of Barrett's/ulceration was  at 32 cm. GE junction was estimated to be 35 and hiatus was at 39-40 cm.  Pictures taken for the record. He had short segment Barrett's. There was  some mucosal edema and  nodularity just above the GE junction at 12 o'clock.  Biopsy was obtained from the ulcer and this nodular mucosa submitted in  separate containers on the way out.   STOMACH:  It was empty and distended very well insufflation. Folds of  proximal stomach were normal. Examination of mucosa at body, antrum, pyloric  channel, as well as angularis, fundus and cardia was normal. There was no  wrap to be seen. There was virtually no fundal wrap.   DUODENUM:  Bulbar mucosa was normal. Scope was passed second part of the  duodenum where mucosa and folds were normal. Endoscope was withdrawn. The  patient tolerated the procedure well. He is in the process of being  extubated.   RECOMMENDATIONS:  1.  He will go back on Prevacid 30 mg p.o. b.i.d..  2.  I will call him with biopsy results. Since his symptoms have never been      controlled to his satisfaction  3.  Once histology is available for review, will consider referral to a      tertiary center for consideration of redo anti reflux surgery. Given      endoscopic findings, I would  do not feel that he needs a pH study. I      will be glad to do preop esophageal manometry.      [name], M.D.  Electronically Signed     NR/MEDQ  D:  08/15/2005  T:  08/15/2005  Job:  [id number]   cc:   Kirk Ruths, M.D.  Fax: [phone]
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
## 2010-10-18 NOTE — Op Note (Signed)
NAMECLINT, [name] NO.:  [id number]   MEDICAL RECORD NO.:  [id number]          PATIENT TYPE:  INP   LOCATION:  3313                         FACILITY:  [facility]   PHYSICIAN:  Ines Bloomer, M.D. DATE OF BIRTH:  [birth year]   DATE OF PROCEDURE:  10/21/2005  DATE OF DISCHARGE:                                 OPERATIVE REPORT   PREOPERATIVE DIAGNOSIS:  Status post hiatal hernia repair with recurrent  reflux.   POSTOPERATIVE DIAGNOSIS:  Status post hiatal hernia repair with recurrent  reflux.   OPERATION PERFORMED:  Left thoracotomy, redo hiatal hernia repair with  Harvest Dark IV.   SURGEON:  Ines Bloomer, M.D.   FIRST ASSISTANT:  Pecola Leisure, PA-C.   ANESTHESIA:  General anesthesia.   This 60 year old patient approximately 10-12 years ago had had a previous  Nissen fundoplication which the study showed that it had completely  disrupted. There was marked distortion of the upper stomach. He is brought  to the operating room to do a redo repair to the left thoracotomy incision.   He was turned to the left lateral thoracotomy position and was prepped and  draped in the usual sterile manner. The patient had had right open reflux  with ulcerations and erosions despite continued PPIs. A dual-lumen tube was  inserted, incision was made over the sixth intercostal space and dissection  was carried down dividing the latissimus and reflecting the serratus  anteriorly entering the sixth intercostal space. The intercostal muscle was  taken down off the seventh rib and reflexed superiorly to protect the nerve.  Two __________ were placed at right angles and the inferior pulmonary  ligament was taken down with electrocautery. The pleura was opened and the  esophagus was dissected out looping both the vagus nerves right at the  inferior pulmonary ligament. It was then dissected superiorly up to the  aorta and then dissection started inferiorly down to the hiatus.  When we got  to the hiatus, there was a marked amount of scarring around the hiatus and  this had to be carefully dissected out, first dissecting it anteromedially  and identifying the right crura and then dissecting posterolaterally  identifying the left crura. The peritoneum was entered and the marked  adhesions and redo hernia sac was excised. We dissected posteriorly and were  able to dissect up the stomach. You could see where the previous Nissan had  been done and it was disrupted. You could not really even find any sutures  from the previous surgery but there was marked scarring of the upper portion  of the stomach which the adhesions were all freed up. Then we went  posteriorly and freed up both the left and the right crura. The right crura  was somewhat weak but there was enough muscle to place 5 #1 silk sutures  from the right to the left crura. These were then tagged. It was decided  that the best thing to do was to do a Belsey repair rather than a redo  Nissan. The first stitch was placed in the stomach approximately 1.5 to  2 cm  below the GE junction and brought up above the GE junction approximately a  centimeter to a centimeter and a half. The horizontal mattress sutures with  2-0 Prolene using the Baue bolster which was a pledget and one was placed  medially and laterally to approximate a approximately 240-270 more like at  270 wrapping of the esophagus. After these three sutures were in place, they  were tied down. When the second suture was placed starting in the esophagus  with a pledget then through the serosa of the stomach, the sutures had been  placed. The stomach was then placed below the diaphragm and the sutures were  then sutured to the diaphragm superiorly, medially and laterally and tied  down. Then the crural stitches were tied down checking to be sure that we  could still get a finger medially. After these had been all tied down, the  area was irrigated  copiously. An NG tube had been placed into the stomach. A  right angled chest tube was placed right up the diaphragm and a straight  chest tube superiorly through separate stab wounds. Four drill holes were  placed through the seventh rib and then the pericostal's were brought around  the superior rim. The chest was closed and the muscle layer was closed with  #1 Vicryl and 2-0 Vicryl in the subcutaneous tissue and Ethicon skin clips.  The patient was returned to the recovery room in stable condition.           ______________________________  Ines Bloomer, M.D.     DPB/MEDQ  D:  10/21/2005  T:  10/21/2005  Job:  [id number]   cc:   Adolph Pollack, M.D.  [address]  [place]  [place] [address]

## 2010-10-18 NOTE — Op Note (Signed)
[facility]  Patient:    [name], [name]                       MRN: [id number] Proc. Date: 08/13/00 Adm. Date:  [id number] Attending:  Tressie Stalker D                           Operative Report  BRIEF HISTORY:  The patient is a 60 year old white male, who has suffered from intractable back pain for several years.  He failed medical management and was worked up with a lumbar MRI, which demonstrated a recurrent disk herniation L4-5, as well as significant degenerative disk disease L4-5 and L5-S1.  I discussed the various treatment options with the patient, including doing nothing, continuing medical management and surgery.  The patient weighed the risks, benefits and alternatives to surgery and decided to proceed with two level lumbar decompression and fusion.  PREOPERATIVE DIAGNOSES:  L4-5 recurrent herniated nucleus pulposus, L4-5 and L5-S1 degenerative disk disease, spondylosis, spinal stenosis, lumbago, lumbar radiculopathy.  POSTOPERATIVE DIAGNOSES:  L4-5 recurrent herniated nucleus pulposus, L4-5 and L5-S1 degenerative disk disease, spondylosis, spinal stenosis, lumbago, lumbar radiculopathy.  PROCEDURES:  Redo L4-5 microdiskectomy, L4-5 and L5-S1 posterior lumbar interbody fusion with insertion of Synthes T-LIF cortical bone dowels, posterior segmental instrumentation L4 to S1 with [employer] 90-D titanium pedicle screws and rods, posterolateral arthrodesis with local morcellized autograft bone and cancellous allograft bone, as well as Grafton putty.  SURGEON:  Cristi Loron, M.D.  ASSISTANT:  Tanya Nones. Jeral Fruit, M.D.  ANESTHESIA:  General endotracheal.  ESTIMATED BLOOD LOSS:  500 cc.  SPECIMENS:  None.  COMPLICATIONS:  None.  DRAINS:  None.  DESCRIPTION OF PROCEDURE:  The patient was brought to the operating room by the anesthesia team, general endotracheal anesthesia was induced.  The patient was then turned to  the prone position on the chest rolls.  His lumbosacral region was then shaved and prepared with Betadine scrub and Betadine solution and sterile drapes were applied.  I then injected the area to be incised with Marcaine with epinephrine solution and used a scalpel to make an incision through the patients preexisting surgical scar.  I used electrocautery to dissect down to the thoracolumbar fascia.  I divided it bilaterally, performing a subperiosteal dissection, stripping the paraspinous musculature from the spinous process and lamina of L4, L5 and the upper sacrum.  I inserted the McCullough retractor for exposure.  I obtained an intraoperative radiograph to confirm my location.  I then used the high-speed drill to perform bilateral L4 and L5 laminotomies.  Of note, the patient had a previous laminotomy at L4 on the right and it was scarred down from the previous operation.  I widened the bilateral L4 and L5 laminotomies with the Kerrison punch removing the L4-5 and L5-S1 ligamentum flavum bilaterally decompressing the thecal sac, as well as the bilateral L5 and S1 nerve root.  I then freed up the thecal sac and the nerve roots from the epidural tissue and the scar tissue at the L4-5 on the right and then mobilized them medially with the DErrico retractor and then performed an aggressive diskectomy bilaterally at L4-5 and L5-S1.  I scraped the vertebral endplates at L4-5 and L5-S1 with the various curets, cleaned the soft tissue from the vertebral endplates.  I inserted the interbody spreader at L5-S1, distracted L5-S1.  I then turned my attention to the posterior lumbar  interbody fusion.  I inserted the 7 mm trial T-LIF trial spacer into the left L5-S1 interspace after carefully retracting the nerve roots out of harms way.  There was a good snug fit of the 7 mm trial spacer, I removed the spacer and then I inserted a 7 mm bone graft in a similar fashion under fluoroscopic guidance into  the L5-S1 interspace.  I tapped it into place and then I filled posterior to the cortical bone graft with cancellous allograft bone.  Having completed the posterior lumbar interbody fusion at L5-S1, I now repeat the procedure at the L4-5.  I, in a similar fashion, cleared the vertebral endplates at L4-5 of soft tissue using the curets.  I then carefully placed a 7 mm trial spacer at L4-5 on the left.  There was a good snug fit.  I then placed a 7 mm Synthes T-LIF bone graft into the left side at L4-5 after carefully retracting the nerve roots out of the way I tapped into place. Then, I filled in the interspace posterior to the bone graft with cancellous allograft bone completing the L4-5 and L5-S1 posterior lumbar interbody fusion.  I now turn my attention to the posterior segmental instrumentation.  I used electrocautery to expose the bilateral L4 and L5 transverse processes.  I then, under fluoroscopic guidance, used the high-speed drill to decorticate posterior to the bilateral L4, L5 and S1 pedicles.  I then cannulated the pedicles with the pedicle finder, then tapped the pedicles and inserted 6.75 x 45 mm pedicle screws bilaterally at L4, L5 and 6.75 x 40 mm pedicle screws bilaterally at S1.  The cannulation, tapping and insertion of the pedicle screws was done under fluoroscopic guidance.  I palpated about the bilateral L4, L5 and S1 pedicles and noted that the cortex was not breached and the nerve roots were not injured bilaterally.  I then bent the appropriately length titanium rod and connected the unilateral pedicle screws and secured the caps.  I then placed a cross-connector to connect the two rods and tightened the connections appropriately.  Having completed the posterior segmental instrumentation, I now turn my attention to the posterolateral arthrodesis.  I used the high-speed drill to clear soft tissue and decorticate the L4-5 and L5-S1 facet joint, as well as the L4  pars region and the bilateral L4 and L5 transverse processes.  I then laid a combination of local morcellized autograft bone, cancellous allograft  bone and Grafton putty to augment the posterolateral fusion.  I now inspected the thecal sac bilaterally at L4-5 and L5-S1.  I inspected the bilateral L4, L5 and S1 nerve roots and noted that they were well-decompressed.  I then removed the McCullough retractor and then reapproximated the patients thoracolumbar fascia with interrupted #1 Vicryl, and subcutaneous tissue with interrupted 2-0 Vicryl, and the skin with Steri-Strips and benzoin.  The wound was then coated with bacitracin ointment and sterile dressing applied.  The drapes were removed and the patient was subsequently returned to the supine position, where he was extubated by the anesthesia team and transported to the post anesthesia care unit in stable condition.  All sponge, instrument and needle counts were correct at the end of this case. DD:  08/13/00 TD:  08/13/00 Job: [id number]

## 2010-10-18 NOTE — Discharge Summary (Signed)
NAMEKALYAN, BARABAS NO.:  [id number]   MEDICAL RECORD NO.:  [id number]          PATIENT TYPE:  INP   LOCATION:  3313                         FACILITY:  [facility]   PHYSICIAN:  Ines Bloomer, M.D. DATE OF BIRTH:  1950/08/03   DATE OF ADMISSION:  10/21/2005  DATE OF DISCHARGE:  10/25/2005                                 DISCHARGE SUMMARY   PRIMARY DIAGNOSIS:  Status post hiatal hernia repair with recurrent reflex.   IN-HOSPITAL DIAGNOSIS:  Postoperative atrial fibrillation.   SECONDARY DIAGNOSES:  1.  History of hiatal hernia status post repair and a Nissen fundoplication      1987.  2.  Status post multiple back surgeries.  3.  History of melanoma with node dissection done four years ago.  4.  Status post multiple operations in left lower extremity.  5.  Hypertension.  6.  History of arthritis.  7.  History of Barrett's esophagus, minimal dysplasia, status post treatment      with several protein pump inhibitor.   ALLERGIES:  Allergic to DEMEROL.   IN-HOSPITAL OPERATIONS AND PROCEDURES:  Left thoracotomy with redo hiatal  hernia repair with Harvest Dark IV.   HISTORY AND PHYSICAL AND HOSPITAL COURSE:  The patient is a 60 year old  Caucasian male who had a hiatal hernia repair and a Nissen fundoplication in  1987.  He then had several incision hernias and infections with the last  incision hernia repair with Gore-Tex mesh.  He now returns with symptoms of  severe reflux.  Endoscopy by Dr. __________ showed intestinal metaplasia  with evidence of Barrett's esophagus with minimal dysplasia.  He has been  treated with several proton pump inhibitors.  He has had episodes of the  esophagus erosion, bleeding, melena and multiple endoscopies initially under  general anesthesia.  The patient was evaluated at [facility] and then later by Dr.  Lorin Picket at [facility].  He suggested that he have repeat repair using  thoracic approach.  He has had no manometer.  He  underwent repeat upper GI  that showed the Nissen had slipped with evidence of reflux.  The motility  appeared to be normal.  He was also found on CT scan to have a large  hemangioma that had possibly increased slightly in size since one several  years ago.  The patient was seen and evaluated by Dr. Edwyna Shell.  Dr. Edwyna Shell  discussed with the patient undergoing surgery and repair of his hiatal  hernia.  He discussed risks and benefits with the patient.  The patient  acknowledged understanding and agreed to proceed.  Surgery was scheduled for  Oct 21, 2005.   The patient was taken to the operating room Oct 21, 2005 where he underwent  left thoracotomy with redo hiatal hernia repair with Harvest Dark IV.  The  patient tolerated this procedure and was transferred up to the intensive  care unit in stable condition.  The patient's postoperative course was  pretty much unremarkable.  NG tube was discontinued postoperative day #1.  The patient was started on water at that time.  The patient tolerated  this  well with no nausea, vomiting or aspiration noted.  Postoperative day 2, the  patient was increased to full liquids.  The patient again tolerated this  well with no nausea, vomiting or aspiration noted.  The following day, the  patient was increased to softs and tolerated this well.  The patient was  then increased to regular diet and tolerated without difficulty.  Bowel  movements were within normal limits prior to discharge home.  The patient  did have a short episode of atrial fibrillation on postoperative day #2.  Lopressor was ordered.  The patient did convert back to normal sinus rhythm.  He remained in normal sinus rhythm the remainder of his hospital course.  The patient was out of bed and ambulating well.  His incisions were dry and  intact and healing well.  Chest tubes were discontinued in the normal  fashion.  The patient was able to be weaned off oxygen saturating greater  than 90% on  room air.  The patient's vital signs were monitored and seen to  be stable.  He was afebrile during the remainder of his postoperative  course.   The patient is tentatively ready for discharge home the next one to two  days.  Follow-up appointment was scheduled with Dr. Edwyna Shell for November 05, 2005  at 11:20 a.m.  The patient is to obtain a P&A and lateral chest x-ray one  hour prior to this appointment.  Mr. Matthis received instructions on diet,  activity level and incisional care.  He was told no driving until released  to do so, no lifting over 10 pounds.  The patient was told he is allowed to  shower, washing his incisions using soap and water.  He is to contact the  office if he develops any drainage or opening from any of his incision  sites.  The patient was told to ambulate three to four times per day,  progress as tolerated and to continue his breathing exercises.  He was  educated on diet to be low fat, low salt.  The patient acknowledges  understanding.   DISCHARGE MEDICATIONS:  1.  Ultram 50 mg 150 tablets q.4-6h. p.r.n. pain.  2.  Protonix 40 mg b.i.d.  3.  Lopressor 25 mg b.i.d.  4.  Allegra 80 mg daily.  5.  Lisinopril 5 mg daily.      Theda Belfast, PA    ______________________________  Ines Bloomer, M.D.    KMD/MEDQ  D:  10/24/2005  T:  10/25/2005  Job:  [id number]

## 2010-10-18 NOTE — H&P (Signed)
[name], [name] NO.:  [id number]   MEDICAL RECORD NO.:  [id number]            PATIENT TYPE:   LOCATION:                                 FACILITY:   PHYSICIAN:  [name], M.D.    DATE OF BIRTH:  23-Dec-1950   DATE OF ADMISSION:  03/25/2004  DATE OF DISCHARGE:  LH                                HISTORY & PHYSICAL   PRESENTING COMPLAINT:  Follow for GERD.  The patient has known Barrett's  esophagus.   HISTORY OF PRESENT ILLNESS:  [name] is a 60 year old Caucasian male patient of  Dr. Edison Cochran who was there for scheduled visit.  He was last seen in  January 2004.  At that time he was referred to [name] of [name]  for possible antireflux surgery.  He had EGD by Dr. Gwinda Cochran.  Subsequently  they tried to do a BS study but he was intolerant.  Therefore further care  was not continued.  Prior to that he was sent fo [name].  He ran into the same  problem.  They attempted EGD with cautious sedation.  He was not able to  tolerate that procedure and therefore no further evaluation was undertaken.  He is presently on Prevacid double dose.  He states he has heartburn  intermittently just about every week.  Most of the time it is mild.  Every  now and then it is intractable.  He also has intermittent nocturnal  regurgitation at least a couple of times a month.  He denies dysphagia or  hoarseness but has intermittent cough.  He denies melena, rectal bleeding.  His bowels are moving regularly.  He is on [employer]  He states he has lost  20 pounds this year.  He is very concerned because he has been diagnosed  with Barrett's esophagus.  He wants to know the status of his Barrett's and  also whether or not he has esophagitis and whether or not he should pursue  antireflux surgery again.  He denies abdominal pain.   MEDICATIONS:  1.  He is on Prevacid 30 mg b.i.d.  2.  Allegra 60 mg daily.  3.  Prinivil 5 mg daily.   PAST MEDICAL HISTORY:  1.  History of upper  GI bleed secondary to peptic ulcer disease.  He had the      first episode over 15 ears ago.  He had another episode about 12-13      years ago.  He required PRBCs.  His last admission to [name] was in      June 2000 when he presented with melena.  He had EGD revealing small      sliding hiatal hernia and ulcerative esophagitis.  His fundal wrap was      felt to be loose. He also had duodenitis but his H. pylori serology in      the past were negative.  2.  Long-standing reflux esophagitis.  He had antireflux surgery in 1987,      which was complicated by dysphagia and hernia, for which he had two  surgeries to fix it.  These were done in 1987 and he had mesh placed.   His last EGD was in April 2003.  He had three tiny columns of salmon-colored  mucosa consistent with Barrett's esophagus confirmed histologically.  He  also had two small ulcers involving proximal marginal Barrett's.  There was  a small sliding hiatal hernia.  Fundal wrap was in place but failed to be  very loose.   At that time he also had screening colonoscopy which was normal.   [name] was referred to [name] for antireflux measures.  Unfortunately this  never materialized.   1.  History of back problems.  He has had lumbar spine surgery twice.  2.  He had right thyroid lobectomy for benign tumor in 1987.   ALLERGIES:  DEMEROL.   FAMILY HISTORY:  Positive for intracranial aneurysm.  Mother died of  ruptured aneurysm in her 8s.  Maternal grandmother died of same.  One  sister had surgery for clipping of aneurysm and is doing fine.  He has five  brothers.  Two have had antireflux surgery.  One was diagnosed with  Barrett's esophagus.  The others are in good health.   SOCIAL HISTORY:  He is married.  He has one daughter.  He is self-employed.  He does not smoke cigarettes.  He drinks alcohol occasionally.   PHYSICAL EXAMINATION:  GENERAL:  Mildly obese Caucasian male who is in no  acute distress.  VITAL  SIGNS:  He weighs 244.5 pounds.  He is six feet tall.  Pulse is 68 per  minute.  Blood pressure 160/110.  He is afebrile.  HEENT:  Conjunctivae are pink, sclerae are nonicteric.  Oropharyngeal mucosa  is normal.  NECK:  Without masses or thyromegaly.  CARDIAC:  Regular rhythm, normal S1/S2.  No murmur or gallop noted.  LUNGS:  Clear to auscultation.  ABDOMEN:  Protuberant but soft and nontender without organomegaly or masses.  RECTAL:  Exam is deferred.  EXTREMITIES:  No peripheral edema or clubbing is noted.   ASSESSMENT:  [name] is a 60 year old Caucasian male who has chronic  gastroesophageal reflux disease complicated by Barrett's esophagus.  He had  antireflux surgery back in 1987.  His fundal wrap is now is functionally  incompetent.  Two attempts at getting him antireflux surgery failed for  reasons stated above.  The patient unfortunately is intolerant of conscious  sedation for EGD  and requires anesthesia and I feel the physicians at the  [name] have just not been impressed enough.   If he still had endoscopic evidence of reflux esophagitis, he is at  increased risk for dysplasia and Barrett's or stricture, in which case  antireflux surgery needs to be considered.  However, if there is no evidence  of esophagitis we may continue with medical therapy.  It would definitely  help if he tries to lose another 20-30 pounds.   He is at risk for colon carcinoma and he had a colonoscopy in 2003 which was  normal.  He therefore will not need another one for eight years.   PLAN:  EGD, possible biopsy under anesthesia in near future.  I have  reviewed the procedure risks to the patient and he is agreeable.   In the meantime he will continue Prevacid at 30 mg p.o. b.i.d. and also  pursue antireflux measures rigorously.      NR/MEDQ  D:  03/25/2004  T:  03/25/2004  Job:  [id number]   cc:   [name]  [name], M.D.  [id number]  [place]  [place] [address]  Fax: [phone]

## 2010-10-18 NOTE — Discharge Summary (Signed)
NAMERHYLEE, NUNN NO.:  [id number]   MEDICAL RECORD NO.:  [id number]          PATIENT TYPE:  INP   LOCATION:  3023                         FACILITY:  [facility]   PHYSICIAN:  Cristi Loron, M.D.DATE OF BIRTH:  10/22/1950   DATE OF ADMISSION:  12/18/2005  DATE OF DISCHARGE:  12/22/2005                                 DISCHARGE SUMMARY   ADMIT DATE:  December 18, 2005   DISCHARGE DATE:  December 22, 2005   BRIEF HISTORY:  The patient is a 60 year old white male, who I performed an  L4-5 and L5-S1 posterior lumbar interbody fusion and posterior  instrumentation arthrodesis several years ago.  The patient was excellent  for many years, but more recently has developed increasing back and leg  pain.  He failed medical management and was worked up with a lumbar MRI,  which demonstrated patient had developed degenerative disc disease, spinal  stenosis, etc. at L3-4.  I discussed the various treatment options with the  patient including surgery.  The patient is aware of the risks and benefits  and alternatives to surgery and decided to proceed with an L3-4  decompression and fusion, same position, etc.   For further details of this admission, please refer to typed history and  physical.   HOSPITAL COURSE:  I admitted the patient to [facility] on December 18, 2005.  On day of admission, I performed an L3-4 decompression and fusion.  The surgery went well without any complications (for further details of this  operation, please refer to typed operative note).   POSTOPERATIVE COURSE:  The patient's operative course was unremarkable and  by postop Day #4, he was afebrile.  Vital signs stable.  He was eat well,  ambulating well and was ready for discharge to home.  He was, therefore,  discharged home on December 22, 2005.   DISCHARGE INSTRUCTIONS:  Patient was given written discharge instructions.  Instructed to follow up with me in the office.   DISCHARGE  PRESCRIPTIONS:  1. Percocet 10/325, #110, 1 p.o. q. 4 hours p.r.n. for pain.  2. Valium 5 mg, #50, 1 p.o. q. 6 hours p.r.n. for muscle spasms.   FINAL DIAGNOSES:  1. L3-4 degenerative disc disease.  2. L4-5 stenosis, lumbago, radiculopathy.   PROCEDURE PERFORMED:  Redo L4 laminectomy; L3-4 transforaminal interbody  fusion; insertion of L3-4 interbody prosthesis ([employer] interbody  prosthesis); L3-4 posterolateral arthrodesis with local morcilized autograph  bone; posterior segmental instrumentation L3-S1 with SD90 titanium screws .      Cristi Loron, M.D.  Electronically Signed     JDJ/MEDQ  D:  01/26/2006  T:  01/26/2006  Job:  [id number]

## 2010-10-18 NOTE — Op Note (Signed)
[facility]  Patient:    [name], [name] Visit Number: [id number] MRN: [id number]          Service Type: END Location: DAY Attending Physician:  Malissa Hippo Dictated by:   [name], M.D. Proc. Date: 09/06/01 Admit Date:  09/06/2001                             Operative Report  PROCEDURES: Esophagogastroduodenoscopy followed by colonoscopy.  INDICATION FOR PROCEDURES:  The patient is a 60 year old Caucasian male with a complicated history of reflux esophagitis, who also has a history of Barrett esophagus.  He has had antireflux surgery years ago, but this wrap is functionally incompetent.  He was sent over the [facility] for redo antireflux surgery, but this was not done.  He is presently on antireflux measures and double-dose PPI, and doing fairly well.  He is undergoing surveillance examination.  He is also undergoing screening colonoscopy now that he is 60 years old.  He has been intolerant of conscious sedation.  Therefore, exams are performed under general anesthesia.  SEDATION:  The patient was given general anesthesia for these procedures. Please see CRNAs note.  INSTRUMENTS:  An Olympus video system.  FINDINGS:  Procedures performed in the OR.  After the patient was placed under general anesthesia he was turned on his left side.  PROCEDURE #1:  Esophagogastroduodenoscopy.  The endoscope was passed through oral fittings without interference to esophagus.  The mucosa of the proximal middle third was normal.  Distally he had three thin linear patches of Barrett-type mucosa.  The longest one was about 3 cm.  Two of these had ulcers towards the proximal ends.  There was some mucosal erythema.  There were no masses.  Because of associated inflammation, it was decided not to take any biopsy.  There was a small sliding hiatal hernia.  No stricture or ring was noted.  Stomach was empty and distended very well with insufflation.  Folds  of proximal stomach were normal.  Examination of the mucosa was also normal.  The scope was retroflexed and examined anulus which was normal.  Fundo wrap was short.  Pictures taken for the record.  Duodenum as well as the bulb and second part of the duodenum was normal. Endoscope was removed, and the patient was prepared for colonoscopy.  PROCEDURE #2:  Total colonoscopy.  Rectal examination was performed.  This was within normal limits.  The scope was placed in the rectum and advanced through the sigmoid and beyond.  Preparation was excellent.  The scope was passed to the cecum, which was identified by the appendiceal orifice and the ileocecal valve.  As the scope was withdrawn, the mucosa was carefully examined, and no abnormalities were noted.  Rectal mucosa similarly was normal.  The scope was retroflexed and examined anorectal junction, which was unremarkable.  The anoscope was straightened and withdrawn.  The patient tolerated the procedure well.  FINAL DIAGNOSES: 1. Three thin columns of Barrett esophagus.  The longest column was 3 cm long.    Two small ulcers involving the proximal end of these columns, but because    of associated inflammation, biopsy was not performed. 2. Small sliding hiatal hernia. 3. Fundal wrap in place, although functionally it was deemed to be    incompetent. 4. Normal colonoscopy.  RECOMMENDATIONS:  Continue antireflex measures and Prevacid at 30 mg b.i.d. We will add domperidone 10 mg before evening meals and  bedtime.  I would like for him to lose the 40 pounds that he has gained since his back surgery.  We will plan to bring him back for followup EGD in two years from now. Dictated by:   [name], M.D. Attending Physician:  Malissa Hippo DD:  09/06/01 TD:  09/06/01 Job: [id number]

## 2010-10-18 NOTE — Op Note (Signed)
NAMEDAMAIN, BROADUS                ACCOUNT NO.:  [id number]   MEDICAL RECORD NO.:  [id number]          PATIENT TYPE:  AMB   LOCATION:  DAY                           FACILITY:  [facility]   PHYSICIAN:  [name], M.D.    DATE OF BIRTH:  29-Aug-1950   DATE OF PROCEDURE:  04/03/2004  DATE OF DISCHARGE:                                 OPERATIVE REPORT   PROCEDURE:  Esophagogastroduodenoscopy.   INDICATIONS:  [name] is a 60 year old Caucasian male with chronic GERD  complicated by short-segment Barrett's esophagus.  He is undergoing  surveillance EGD.  Attempts at him getting evaluated for redo antireflux  surgery at a tertiary center have not been successful.  He is presently on  Prevacid 30 mg b.i.d. along with antireflux measures and has occasional  breakthrough symptoms.  The procedure risks were reviewed with the patient,  and informed consent was obtained.   PREMEDICATION:  Please see anesthesia records for details.   FINDINGS:  Procedure performed in the OR.  The patient was given general  anesthesia for this procedure, as he has been intolerant of conscious  sedation, actually has become combative in the past.  After the patient was  placed under anesthesia and intubated, he was placed almost in the left  lateral recumbent position and the Olympus video scope was passed via the  oropharynx without any difficulty into esophagus.   Esophagus:  The mucosa of the esophagus normal.  The proximal margin of  Barrett's was noted at 36 cm from the incisors.  Barrett's mucosa was in the  form of three large patches and few tiny islands above it.  The GE junction  was estimated to be at 38 cm, and hiatus was at 42.  There was focal  erythema at junction of squamous and salmon-colored mucosa, but there were  no erosions, ulcers, or mass noted.  There was some scarring just proximal  to this junction; however, there was no stricture.  Multiple biopsies are  taken from this Barrett's mucosa on  the way out.   Stomach:  It was empty and distended very well with insufflation.  Folds in  the proximal stomach are normal.  Examination of mucosa at body, antrum,  pyloric channel, as well as angularis, fundus, and cardia was normal.  A  retroflexed view revealed completely undone fundal wrap, which he had in  1987.   Duodenum:  Examination of the bulb reveals normal mucosa.  The scope was  passed to the second part of the duodenum, where mucosa and folds were  normal.  Endoscope was withdrawn.  The patient tolerated the procedure well.  The patient was extubated and brought to PACU in stable condition.   FINAL DIAGNOSES:  1.  Short-segment Barrett's esophagus, which is 2 cm long involving one of      the extensions.  Focal erythema involving the junction of the squamous      and Barrett's epithelium but no evidence of ulceration or stricture.  2.  Small sliding hiatal hernia.  3.  Fundal wrap has been completely undone.  4.  Normal  examination of the stomach, first and second part of the      duodenum.   Please see measurements as above.   RECOMMENDATIONS:  1.  We will continue with antireflux measures and Prevacid at 30 mg p.o.      b.i.d.  2.  I will be contacting the patient with biopsy results and plan to bring      him back in two to three years.     [name]   NR/MEDQ  D:  04/03/2004  T:  04/03/2004  Job:  [id number]   cc:   Kirk Ruths, M.D.  [address]  [place]  [place] [address]  Fax: [phone]

## 2010-10-18 NOTE — Discharge Summary (Signed)
[place]  Patient:    [name], [name]                       MRN: [id number] Adm. Date:  [id number] Disc. Date: 08/17/00 Attending:  Tressie Stalker D                           Discharge Summary  For full details of this admission, please refer to typed History and Physical.  BRIEF ADMISSION HISTORY:  The patient is a 60 year old white male who has suffered from intractable back pain for several years.  He failed medical management and was worked up with a lumbar MRI which demonstrated a recurrent disk herniation at L4-L5 as well as significant degenerative disk disease at L4-L5 and L5-S1.  I discussed various treatment options with him including doing nothing, continue medical management, and surgery.  The patient weighed the risks, benefits, and alternatives to surgery and decided to proceed with a two-level lumbar decompression and fusion.  For Past Medical History, Past Surgical History, Medications Prior to Admission, Drug Allergies, Family Medical History, Social History, Admission Physical Exam, Imaging Studies, Assessment, Plan, etc., please refer to the typed History and Physical.  HOSPITAL COURSE:  I performed a redo L4-L5 microdiskectomy, L4-L5 and L5-S1 posterior lumbar interbody fusion, insertion of Synthes TLIF cortical bone dowels, posterior segmental instrumentation L4 to S1 with [employer] 90-degree titanium pedicle screws and rods, posterior lateral arthrodesis with local morselized autograft bone and cancellous allograft bone as well as Grafton putty on August 13, 2000, without complications.  (For full details of this operation please refer to typed Operative Note.)  Postoperative course:  The patients postoperative course was essentially unremarkable.  He did have some muscle spasms that were effectively treated with Flexeril and Valium.  By postoperative day #4, he was eating well, ambulating well, his wound was  healing well without signs of infection, he was afebrile, his vital signs were stable, and he was requesting discharge home. He is therefore discharged home on August 17, 2000.  DISCHARGE MEDICATIONS: 1. Percocet 10, #60, one p.o. q.4h. p.r.n. pain, no refills. 2. Flexeril 10 mg, #90, one p.o. t.i.d. muscle spasms. 4. Valium 5 mg, #40, one p.o. q.6h. p.r.n. muscle spasms, one refill.  DISCHARGE INSTRUCTIONS:  The patient was given written Discharge Instructions.  DISCHARGE FOLLOWUP:  Instructed to follow up with me in four weeks.  FINAL DIAGNOSES: 1. L4-L5 recurrent herniated nucleus pulposus. 2. L4-L5 and L5-S1 degenerative disk disease with spondylosis, spinal    stenosis, lumbago, and lumbar radiculopathy.  PROCEDURE PERFORMED:  Redo L4-L5 microdiskectomy, L4-L5 and L5-S1 posterior lumbar interbody fusion, insertion of Synthes TLIF cortical bone dowels, posterior segmental instrumentation L4 to S1 with [employer] pedicle screws and rods, posterior lateral arthrodesis with local morselized autograft bone, cancellous allograft bone, and Grafton putty. DD:  08/17/00 TD:  08/17/00 Job: [id number]
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
## 2010-10-18 NOTE — Op Note (Signed)
[name], WITTER NO.:  [id number]   MEDICAL RECORD NO.:  [id number]          PATIENT TYPE:  INP   LOCATION:  3023                         FACILITY:  [facility]   PHYSICIAN:  Cristi Loron, M.D.DATE OF BIRTH:  May 31, 1951   DATE OF PROCEDURE:  12/18/2005  DATE OF DISCHARGE:                                 OPERATIVE REPORT   BRIEF HISTORY:  The patient is a 60 year old white male who I performed a L4-  5, L5-S1 posterior lumbar interbody fusion instrumentation and posterior  arthrodesis on several years ago.  The patient did excellent for many years  but more recently has developed increasing back and leg pain.  He failed  medical management, was worked up with a lumbar MRI which demonstrated the  patient had developed degenerative disk disease, spinal stenosis, etc. at L3-  4.  I discussed the various treatment options with the patient including  surgery.  The patient has weighed the risks, benefits and alternatives to  surgery and decided to proceed with an L3-4 decompression, fusion,  stabilization, etc.   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   PROCEDURE:  Redo L4 laminectomy; L3-4 transforaminal interbody fusion;  insertion of L3-4 interbody prosthesis (Stryker PEEK interbody prosthesis),  L3-4 posterolateral arthrodesis with local morselized autograft bone;  posterior segmental instrumentation L3 to S1.   SURGEON:  Dr. Delma Officer   ASSISTANT:  Dr. Barnett Abu.   ANESTHESIA:  General endotracheal.   ESTIMATED BLOOD LOSS:  300 mL.   SPECIMENS:  None.   DRAINS:  None.   COMPLICATIONS:  None.   DESCRIPTION OF PROCEDURE:  The patient was brought to the operating room by  anesthesia team. General endotracheal anesthesia was induced.  The patient  was turned to the  prone position on the Wilson frame.  His lumbosacral  region was then shaved and prepared with Betadine scrub and Betadine  solution.  Sterile drapes were applied.  I then injected the area to be  incised with Marcaine with epinephrine.  I used a scalpel to make a linear  midline incision through his previous surgical scar and extended it in the  cephalad direction.  I then used electrocautery to perform a bilateral  subperiosteal dissection exposing the spinous processes and lamina of L2, 3,  4 and exposing the prior hardware.  I then removed the cross connector and  the caps and rods from the prior pedicle screws.  I then used the high speed  drill to perform bilateral L3 laminotomies.  I widened the laminotomies with  Kerrison punch decompressing the thecal sac and performed the foraminotomy  about the bilateral L3 and 4 nerve roots completing the decompression at  that level.  There appeared to be some residual stenosis at L4-5.  I  therefore used the high speed drill and Kerrison punch to remove the  remainder of the L4 lamina.  I encountered the expected amount of epidural  fibrosis at this level from the  prior operation.  At this point we had good  decompression.  I now turned my attention to the interbody arthrodesis.  I excised the right  side L3-4 intervertebral disk with a 15 blade scalpel and I performed a  decompressive diskectomy from that side, from the right side using a  pituitary forceps and Scoville curettes cleaning the vertebral end plates of  soft tissue.  I then placed a 12 x 25 mm trial into the interspace.  I  removed the trialer and then prefilled a 12 x 25 mm PEEK Stryker interbody  prosthesis with local autograft bone and Vitoss and inserted it into the L3-  4 intervertebral disk space, of course after retracting neural structures  out of harm's way.  I performed a transforaminal lumbar interbody fusion  (TLIF).  I then filled posterior to the prosthesis with  local autograft bone  and Vitoss between the posterior lumbar interbody fusion.   I then under fluoroscopic guidance cannulated the bilateral L3 pedicles with  the bone probe, tapped the pedicles with 6.25 mm tap, probed inside the  tapped pedicles to rule out cortical breeches and I then inserted 6.5 x 50  pedicle screws bilaterally at L3 under fluoroscopic guidance.  I palpated  along the medial aspect at the L3 pedicle and noted there were no cortical  breeches and the L3 nerve root was not injured.  I then connected the  unilateral pedicle screws with a lordotic rod and then a special rod in  place by placing the caps and then I placed a cross connector and secured  it, completing the instrumentation.  I should mention that the rod went from  L3 down to S1 bilaterally.   We now turned attention to posterolateral arthrodesis.  I used a high speed  drill to decorticate the remainder of L3-4 facet and pars region and we laid  a combination of local morcellized autograft bone and Vitoss over these  decorticated posterolateral structures completing the posterolateral  arthrodesis.   I then irrigated the wound out with bacitracin solution.  I then inspected  the thecal sac and the bilateral L3, 4 and 5 nerve roots and noted they were  well decompressed.  I then obtained hemostasis using bipolar electrocautery  and then removed the cerebellar retractors.  I then reapproximated the  patient's thoracolumbar fascia with interrupted #1 Vicryl suture,  subcutaneous tissue with interrupted 2-0 Vicryl suture and the skin with  Steri-Strips and benzoin.  The wound was then coated with bacitracin  ointment.  A sterile dressing was applied.  The drapes were removed.  The  patient was subsequently returned to supine position where he was extubated  by the anesthesia team and transported to the post anesthesia care unit in stable condition.  All sponge, instrument and needle counts were correct at   the end of this case.      Cristi Loron, M.D.  Electronically Signed     JDJ/MEDQ  D:  12/18/2005  T:  12/19/2005  Job:  [id number]
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
## 2010-10-18 NOTE — Op Note (Signed)
NAME:  [name], [name]                          ACCOUNT NO.:  [id number]   MEDICAL RECORD NO.:  [id number]                   PATIENT TYPE:  INP   LOCATION:  5018                                 FACILITY:  [facility]   PHYSICIAN:  Mark C. Ophelia Charter, M.D.                 DATE OF BIRTH:  05-16-1951   DATE OF PROCEDURE:  10/10/2002  DATE OF DISCHARGE:                                 OPERATIVE REPORT   PREOPERATIVE DIAGNOSIS:  Trimalleolar medial and lateral tibial plateau  fracture with intercondylar imminence extension.   POSTOPERATIVE DIAGNOSIS:  Trimalleolar medial and lateral tibial plateau  fracture with intercondylar imminence extension.   PROCEDURE:  ORIF of left proximal tibia.   SURGEON:  Mark C. Ophelia Charter, M.D.   ASSISTANT:  Genene Churn. Denton Meek.   TOURNIQUET TIME:  Less than an hour and a half.   DESCRIPTION OF PROCEDURE:  After discussion of general anesthesia and  standard prepping and draping with preoperative Ancef prophylaxis, the usual  impervious stockinette and Coban, extremities sheet, and Betadine Vi-Drape  after sterile skin marker, the leg was wrapped with an Esmarch and the  tourniquet was inflated.  Incision was made S-shaped with the distal aspect  being more anterior over the lateral tibial plateau.  Subperiosteal  dissection was performed exposing the severely comminuted tibial plateau  fracture that extended into the imminence. Slipping underneath the meniscus,  straight Kocher was placed underneath the fat pad, elevating the meniscus  with a Sin rake and visual inspection.  Distracting the fragment, taking  cancellous bone and packing it from distal to proximal, cancellous chips  were added and packed down with 20 mL cancellous bank bone graft for  augmentation.  The reduction was performed.  K-wires were placed.  The K-  wires were backed up some and several adjustments were made until the major  fragments were in improved position.   Lateral buttress plate  A-hole plate was applied.  This was a 7-hole plate  length, but the proximal portion was a T-shaped with two holes.  With  reduction held, the initial screws were placed and plate was holding all  areas except for the posterior aspect of the lateral plateau where there was  severe comminution.  Proximal screws were placed.  Cancellous 60 to 75 and  then the distal 5 screws were bicortical 4.5 self-tapping.  Fluoroscopy was  used to check position and the plate required some custom bending.  With  pressure applied and elevation, there was reasonably good anatomic reduction  of the tibial plateau except for the very back posterolateral corner where  there was severe comminution.  A separate screw was placed at the level of  the second hole from the top just posterior to the plate grabbing one  remaining piece and passing it across the opposite cortex.  6.5 cancellous  for additional securement of the cortical piece.  Entire  apparatus inspected  under fluoroscopy and after irrigation, the fascia was loosely approximated  with 0 Vicryl to allow egress of fluids and prevent department syndrome.  The subcutaneous tissue  reapproximated with 2-0 Vicryl.  Skin with staple, Marcaine infiltration,  and then postoperative dressing.  Needle, sponge, and instrument count  correct.  The patient was transferred to the recovery room and was  neurologically intact.                                               Mark C. Ophelia Charter, M.D.    MCY/MEDQ  D:  10/10/2002  T:  10/11/2002  Job:  [id number]
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
## 2010-10-18 NOTE — H&P (Signed)
[place]  Patient:    [name], [name]                       MRN: [id number] Adm. Date:  [id number] Attending:  Tressie Stalker D                         History and Physical  CHIEF COMPLAINT: Back pain.  HISTORY OF PRESENT ILLNESS: This patient is a 60 year old white male who has had trouble with his back and underwent an L4-5 microdiskectomy on the right by Dr. Roxan Hockey in January 1996.  He had relief of his leg pain but has had persistent and intractable back pain.  He has been treated with multiple modalities including physical therapy, rest, medications, epidural steroid injections, etc. without significant relief.  The patient was worked up with a lumbar MRI and this demonstrated significant degenerative disease at L4-5 and L5-S1, and the patient therefore weighed the risks and benefits and alternatives of surgery and decided to proceed with lumbar fusion.  PAST MEDICAL HISTORY:  1. Peptic ulcer disease with "four bleeding ulcers."  2. Hiatal hernia.  3. Benign thyroid nodule.  4. Ventral hernia.  5. Chronic sinus trouble.  PAST SURGICAL HISTORY:  1. Repair of incisional hernia x 2 in 1987.  2. Right thyroid lobectomy for benign nodule in 1987.  3. Hiatal hernia repair in 1987 by Dr. Francina Ames.  4. Right L4-5 microdiskectomy in January 1996 by Dr. Rosalie Doctor.  CURRENT MEDICATIONS: Prevacid 30 mg p.o. b.i.d.  ALLERGIES: DEMEROL.  FAMILY HISTORY: The patients mother died at the age of 73 of a ruptured cerebral aneurysm.  The patients father died at the age of 27 of myocardial infarction.  His maternal grandmother, sister, and two brothers have had ruptured cerebral aneurysm.  The patient has been worked up for multiple familial aneurysms with an MRA, which was negative.  SOCIAL HISTORY: The patient is married and has a 48 year old daughter.  He lives in [place], [place] and is self-employed doing Programmer, applications.   He quit smoking back in 1982.  He rarely drinks alcohol and denies drug use.  REVIEW OF SYSTEMS: Negative except as above.  PHYSICAL EXAMINATION:  GENERAL: The patient is a pleasant, well-developed, well-nourished 60 year old white male, who walks with an an antalgic gait, and is complaining of back pain.  VITAL SIGNS: Height 6 feet.  Weight 250 pounds.  HEENT: Normal.  NECK: Normal.  THORAX: Symmetric.  LUNGS: Clear to auscultation.  HEART: Regular rate and rhythm.  ABDOMEN: Obese, nontender.  Well-healed laparotomy incision.  BACK: Well-healed lumbar incision without signs or infection.  No point tenderness or deformity.  Straight leg raise testing and fabere testing negative bilaterally.  NEUROLOGIC: The patient is alert and oriented x 3.  Cranial nerves 2-12 grossly intact bilaterally.  Vision and hearing grossly normal bilaterally. Motor strength 5/5 in bilateral deltoid, biceps, triceps, hand grip, wrist extensor, interosseous, psoas, quadriceps, gastrocnemius, and extensor hallucis longus.  Cerebellar examination intact to rapid alternating movements of upper extremities bilaterally.  Sensory examination normal to light touch and pinprick sensation in all tested dermatomes bilaterally.  Deep tendon reflexes 2/4 in bilateral biceps, triceps, brachial radialis, quadriceps, gastrocnemius.  Has bilateral flexor and plantar reflexes.  No ankle clonus.  IMAGING STUDIES: Lumbar MRI performed with and without contrast at [facility] on June 29, 2000 shows surgery degenerative disease at L4-5 and L5-S1, with  loss of disk space height, desiccation, and disk herniation. There is some mild bulging at L3-4.  ASSESSMENT/PLAN: L4-5 and L5-S1 degenerative disease with herniated nucleus pulposus, spondylosis, and lumbago.  I have discussed this issue with the patient and his wife, reviewed the MRI scan with them and pointed out abnormalities with significant pathology  at L4-5 and L5-S1, with some mild degenerative changes at L3-4, and have discussed the various treatment options with them including doing nothing, continued medical management, steroid injection, physical therapy, and surgery.  I have described the procedure of an L4-5 and L5-S1 diskectomy with posterior lumbar interbody fusion and insertion of pedicle screws and rods with posterolateral fusion and the use of allograft bone.  I have described the surgical procedure, shown them surgical models, and discussed the risks of surgery extensively.  The patient has weighed the risks and benefits and alternatives of surgery and has decided to proceed with the operation on August 13, 2000. DD:  08/13/00 TD:  08/13/00 Job: [id number]
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
## 2011-02-20 LAB — CBC
HCT: 44.8
Hemoglobin: 15
MCHC: 33.5
MCV: 89
Platelets: 218
RBC: 5.04
RDW: 14.3
WBC: 6.2

## 2011-02-20 LAB — BASIC METABOLIC PANEL
BUN: 23
CO2: 26
Calcium: 9.5
Chloride: 104
Creatinine, Ser: 1.15
GFR calc Af Amer: >60
GFR calc non Af Amer: >60
Glucose, Bld: 101 — ABNORMAL HIGH
Potassium: 4.8
Sodium: 137

## 2011-02-25 LAB — CBC
HCT: 38.7 — ABNORMAL LOW
HCT: 39.5
HCT: 42.6
HCT: 44.5
Hemoglobin: 13.3
Hemoglobin: 13.7
Hemoglobin: 14.4
Hemoglobin: 14.9
MCHC: 33.5
MCHC: 33.8
MCHC: 34.4
MCHC: 34.6
MCV: 88.4
MCV: 89.1
MCV: 89.2
MCV: 89.2
Platelets: 222
Platelets: 228
Platelets: 235
Platelets: 279
RBC: 4.35
RBC: 4.48
RBC: 4.78
RBC: 4.99
RDW: 13.3
RDW: 13.6
RDW: 13.7
RDW: 13.7
WBC: 12 — ABNORMAL HIGH
WBC: 12.1 — ABNORMAL HIGH
WBC: 9.2
WBC: 9.8

## 2011-02-25 LAB — TYPE AND SCREEN
ABO/RH(D): A POS
ABO/RH(D): A POS
Antibody Screen: POSITIVE
Antibody Screen: POSITIVE
DAT, IgG: NEGATIVE

## 2011-02-25 LAB — URINALYSIS, ROUTINE W REFLEX MICROSCOPIC
Bilirubin Urine: NEGATIVE
Glucose, UA: NEGATIVE
Hgb urine dipstick: NEGATIVE
Ketones, ur: NEGATIVE
Nitrite: NEGATIVE
Protein, ur: NEGATIVE
Specific Gravity, Urine: 1.021
Urobilinogen, UA: 0.2
pH: 5

## 2011-02-25 LAB — BASIC METABOLIC PANEL
BUN: 12
BUN: 15
BUN: 18
CO2: 24
CO2: 27
CO2: 28
Calcium: 8.5
Calcium: 8.5
Calcium: 9.4
Chloride: 103
Chloride: 104
Chloride: 104
Creatinine, Ser: 1.06
Creatinine, Ser: 1.1
Creatinine, Ser: 1.17
GFR calc Af Amer: >60
GFR calc Af Amer: >60
GFR calc Af Amer: >60
GFR calc non Af Amer: >60
GFR calc non Af Amer: >60
GFR calc non Af Amer: >60
Glucose, Bld: 104 — ABNORMAL HIGH
Glucose, Bld: 107 — ABNORMAL HIGH
Glucose, Bld: 113 — ABNORMAL HIGH
Potassium: 3.9
Potassium: 3.9
Potassium: 4
Sodium: 137
Sodium: 138
Sodium: 138

## 2011-02-25 LAB — COMPREHENSIVE METABOLIC PANEL
ALT: 20
AST: 49 — ABNORMAL HIGH
Albumin: 3.2 — ABNORMAL LOW
Alkaline Phosphatase: 81
BUN: 15
CO2: 26
Calcium: 9.2
Chloride: 103
Creatinine, Ser: 1.22
GFR calc Af Amer: >60
GFR calc non Af Amer: >60
Glucose, Bld: 109 — ABNORMAL HIGH
Potassium: 4.8
Sodium: 138
Total Bilirubin: 0.9
Total Protein: 6.8

## 2011-02-25 LAB — BLOOD GAS, ARTERIAL
Acid-Base Excess: 0.1
Acid-Base Excess: 1.4
Bicarbonate: 24.6 — ABNORMAL HIGH
Bicarbonate: 25.4 — ABNORMAL HIGH
Drawn by: 181601
FIO2: 0.21
FIO2: 0.21
O2 Saturation: 94.9
O2 Saturation: 95.1
Patient temperature: 98.6
Patient temperature: 98.6
TCO2: 25.9
TCO2: 26.6
pCO2 arterial: 39.5
pCO2 arterial: 42.9
pH, Arterial: 7.377
pH, Arterial: 7.424
pO2, Arterial: 72.7 — ABNORMAL LOW
pO2, Arterial: 74.1 — ABNORMAL LOW

## 2011-02-25 LAB — PROTIME-INR
INR: 1
Prothrombin Time: 13.1

## 2011-02-25 LAB — APTT: aPTT: 33

## 2011-02-26 LAB — URINALYSIS, ROUTINE W REFLEX MICROSCOPIC
Bilirubin Urine: NEGATIVE
Glucose, UA: NEGATIVE
Hgb urine dipstick: NEGATIVE
Ketones, ur: NEGATIVE
Nitrite: NEGATIVE
Protein, ur: NEGATIVE
Specific Gravity, Urine: 1.024
Urobilinogen, UA: 0.2
pH: 5

## 2011-02-27 LAB — BASIC METABOLIC PANEL
BUN: 25 — ABNORMAL HIGH
CO2: 24
Calcium: 9.4
Chloride: 104
Creatinine, Ser: 1.08
GFR calc Af Amer: >60
GFR calc non Af Amer: >60
Glucose, Bld: 88
Potassium: 4.6
Sodium: 135

## 2011-03-12 LAB — CREATININE, SERUM
Creatinine, Ser: 1.07
GFR calc Af Amer: >60
GFR calc non Af Amer: >60

## 2011-03-12 LAB — BUN: BUN: 14

## 2011-05-13 ENCOUNTER — Other Ambulatory Visit (HOSPITAL_COMMUNITY): Payer: Self-pay | Admitting: Physical Medicine and Rehabilitation

## 2011-05-13 DIAGNOSIS — M67919 Unspecified disorder of synovium and tendon, unspecified shoulder: Secondary | ICD-10-CM

## 2011-05-16 ENCOUNTER — Ambulatory Visit (HOSPITAL_COMMUNITY)
Admission: RE | Admit: 2011-05-16 | Discharge: 2011-05-16 | Disposition: A | Discharge status: Home / Self Care | Payer: Medicare Other | Source: Ambulatory Visit | Attending: Physical Medicine and Rehabilitation | Admitting: Physical Medicine and Rehabilitation

## 2011-05-16 DIAGNOSIS — M67919 Unspecified disorder of synovium and tendon, unspecified shoulder: Secondary | ICD-10-CM

## 2011-05-16 DIAGNOSIS — M719 Bursopathy, unspecified: Secondary | ICD-10-CM

## 2011-05-16 DIAGNOSIS — M25519 Pain in unspecified shoulder: Secondary | ICD-10-CM | POA: Insufficient documentation

## 2011-05-19 ENCOUNTER — Other Ambulatory Visit (HOSPITAL_COMMUNITY): Payer: Self-pay | Admitting: Physical Medicine and Rehabilitation

## 2011-05-19 DIAGNOSIS — M961 Postlaminectomy syndrome, not elsewhere classified: Secondary | ICD-10-CM

## 2011-05-19 DIAGNOSIS — M542 Cervicalgia: Secondary | ICD-10-CM

## 2011-05-19 DIAGNOSIS — M5030 Other cervical disc degeneration, unspecified cervical region: Secondary | ICD-10-CM

## 2011-05-20 ENCOUNTER — Ambulatory Visit (HOSPITAL_COMMUNITY)
Admission: RE | Admit: 2011-05-20 | Discharge: 2011-05-20 | Disposition: A | Discharge status: Home / Self Care | Payer: Medicare Other | Source: Ambulatory Visit | Attending: Physical Medicine and Rehabilitation | Admitting: Physical Medicine and Rehabilitation

## 2011-05-20 DIAGNOSIS — M542 Cervicalgia: Secondary | ICD-10-CM | POA: Insufficient documentation

## 2011-05-20 DIAGNOSIS — M129 Arthropathy, unspecified: Secondary | ICD-10-CM | POA: Insufficient documentation

## 2011-05-20 DIAGNOSIS — M25519 Pain in unspecified shoulder: Secondary | ICD-10-CM | POA: Insufficient documentation

## 2011-05-20 DIAGNOSIS — M5030 Other cervical disc degeneration, unspecified cervical region: Secondary | ICD-10-CM

## 2011-05-20 DIAGNOSIS — M961 Postlaminectomy syndrome, not elsewhere classified: Secondary | ICD-10-CM

## 2011-06-17 ENCOUNTER — Encounter: Payer: Self-pay | Admitting: Internal Medicine

## 2011-07-11 ENCOUNTER — Other Ambulatory Visit (HOSPITAL_COMMUNITY): Payer: Self-pay | Admitting: Neurosurgery

## 2011-07-11 ENCOUNTER — Other Ambulatory Visit: Payer: Self-pay | Admitting: Neurosurgery

## 2011-07-11 DIAGNOSIS — M542 Cervicalgia: Secondary | ICD-10-CM

## 2011-07-16 ENCOUNTER — Other Ambulatory Visit: Payer: Self-pay | Admitting: Neurosurgery

## 2011-08-01 ENCOUNTER — Ambulatory Visit (HOSPITAL_COMMUNITY)
Admission: RE | Admit: 2011-08-01 | Discharge: 2011-08-01 | Disposition: A | Discharge status: Home / Self Care | Payer: Medicare Other | Source: Ambulatory Visit | Attending: Neurosurgery | Admitting: Neurosurgery

## 2011-08-01 ENCOUNTER — Other Ambulatory Visit (HOSPITAL_COMMUNITY): Payer: Self-pay | Admitting: Neurosurgery

## 2011-08-01 ENCOUNTER — Encounter (HOSPITAL_COMMUNITY): Payer: Self-pay | Admitting: Pharmacy Technician

## 2011-08-01 DIAGNOSIS — M542 Cervicalgia: Secondary | ICD-10-CM

## 2011-08-01 DIAGNOSIS — M47812 Spondylosis without myelopathy or radiculopathy, cervical region: Secondary | ICD-10-CM | POA: Insufficient documentation

## 2011-08-01 DIAGNOSIS — Z981 Arthrodesis status: Secondary | ICD-10-CM | POA: Insufficient documentation

## 2011-08-01 DIAGNOSIS — M4804 Spinal stenosis, thoracic region: Secondary | ICD-10-CM | POA: Insufficient documentation

## 2011-08-01 DIAGNOSIS — M48061 Spinal stenosis, lumbar region without neurogenic claudication: Secondary | ICD-10-CM | POA: Insufficient documentation

## 2011-08-01 MED ORDER — DIAZEPAM 5 MG PO TABS: 10.0000 mg | ORAL_TABLET | Freq: Once | ORAL | Status: DC

## 2011-08-01 MED ORDER — MORPHINE SULFATE 4 MG/ML IJ SOLN: 2.0000 mg | INTRAMUSCULAR | Status: DC | PRN

## 2011-08-01 MED ORDER — IOHEXOL 300 MG/ML  SOLN
10.0000 mL | Freq: Once | INTRAMUSCULAR | Status: AC | PRN
  Administered 2011-08-01: 10 mL via INTRAVENOUS

## 2011-08-01 MED ORDER — OXYCODONE-ACETAMINOPHEN 5-325 MG PO TABS: 1.0000 | ORAL_TABLET | ORAL | Status: DC | PRN

## 2011-08-01 MED ORDER — ONDANSETRON HCL 4 MG/2ML IJ SOLN: 4.0000 mg | Freq: Four times a day (QID) | INTRAMUSCULAR | Status: DC | PRN

## 2011-08-01 NOTE — Op Note (Signed)
Brief history: The patient complains of neck pain.  Procedure: Cervical myelogram  Surgeon: Dr. Jeff Kyzer Blowe  Asst.: None  Level injected:L4/5  Dye injected: 8 cc of Omnipaque 300  CSF appearance: Clear  Estimated blood loss minimal  Complications: None 

## 2011-08-01 NOTE — Discharge Instructions (Signed)
Myelogram and Lumbar Puncture Discharge Instructions  1. Go home and rest quietly for the next 24 hours.  It is important to lie flat for the next 24 hours.  Get up only to go to the restroom.  You may lie in the bed or on a couch on your back, your stomach, your left side or your right side.  You may have one pillow under your head.  You may have pillows between your knees while you are on your side or under your knees while you are on your back.  2. DO NOT drive today.  Recline the seat as far back as it will go, while still wearing your seat belt, on the way home.  3. You may get up to go to the bathroom as needed.  You may sit up for 10 minutes to eat.  You may resume your normal diet and medications unless otherwise indicated.  4. The incidence of headache, nausea, or vomiting is about 5% (one in 20 patients).  If you develop a headache, lie flat and drink plenty of fluids until the headache goes away.  Caffeinated beverages may be helpful.  If you develop severe nausea and vomiting or a headache that does not go away with flat bed rest, call MD.  5. You may resume normal activities after your 24 hours of bed rest is over; however, do not exert yourself strongly or do any heavy lifting tomorrow.  6. Call your physician for a follow-up appointment.  The results of your myelogram will be sent directly to your physician by the following day.  7. If you have any questions or if complications develop after you arrive home, please call MD.  Discharge instructions have been explained to the patient.  The patient, or the person responsible for the patient, fully understands these instructions.   

## 2011-08-01 NOTE — H&P (Signed)
Subjective: Patient is a 61 year old white male who has had chronic neck and back pain. He has had multiple prior surgeries. He had increasing pain into his neck right shoulder and arm. He has failed medical management. He can't get an MRI because he has a spinal cord stimulator in his lumbar region.  No past medical history on file.  No past surgical history on file.  Allergies  Allergen Reactions  . Demerol Other (See Comments)    MAKES ME CRAZY!!    History  Substance Use Topics  . Smoking status: Not on file  . Smokeless tobacco: Not on file  . Alcohol Use: Not on file    No family history on file. Prior to Admission medications   Medication Sig Start Date End Date Taking? Authorizing Provider  calcium-vitamin D (OSCAL WITH D) 500-200 MG-UNIT per tablet Take 1 tablet by mouth every evening.   Yes Historical Provider, MD  CINNAMON PO Take 2 tablets by mouth daily at 12 noon.   Yes Historical Provider, MD  dexlansoprazole (DEXILANT) 60 MG capsule Take 60 mg by mouth daily.   Yes Historical Provider, MD  diazepam (VALIUM) 10 MG tablet Take 10 mg by mouth daily as needed. For nerves/anxiety   Yes Historical Provider, MD  fexofenadine (ALLEGRA) 180 MG tablet Take 180 mg by mouth every evening.   Yes Historical Provider, MD  lisinopril (PRINIVIL,ZESTRIL) 10 MG tablet Take 10 mg by mouth daily.   Yes Historical Provider, MD  montelukast (SINGULAIR) 10 MG tablet Take 10 mg by mouth at bedtime.   Yes Historical Provider, MD  Multiple Vitamin (MULITIVITAMIN WITH MINERALS) TABS Take 1 tablet by mouth daily.   Yes Historical Provider, MD  omega-3 acid ethyl esters (LOVAZA) 1 G capsule Take 1 g by mouth 2 (two) times daily.   Yes Historical Provider, MD  oxyCODONE-acetaminophen (TYLOX) 5-500 MG per capsule Take 1-2 capsules by mouth every 4 (four) hours as needed. For pain. Pt takes 1 or 2 tabs as needed for pain   Yes Historical Provider, MD  promethazine (PHENERGAN) 25 MG tablet Take 25-50 mg  by mouth every 6 (six) hours as needed. Takes 1 or 2 tabs for nausea   Yes Historical Provider, MD  rosuvastatin (CRESTOR) 10 MG tablet Take 10 mg by mouth daily.   Yes Historical Provider, MD     Review of Systems  Positive ROS: As above  All other systems have been reviewed and were otherwise negative with the exception of those mentioned in the HPI and as above.  Objective: Vital signs in last 24 hours: Temp:  [96 F (35.6 C)] 96 F (35.6 C) (03/01 0609) Pulse Rate:  [78] 78  (03/01 0609) Resp:  [18] 18  (03/01 0609) BP: (131)/(86) 131/86 mmHg (03/01 0609) SpO2:  [95 %] 95 % (03/01 0609) Weight:  [97.523 kg (215 lb)] 97.523 kg (215 lb) (03/01 0609)  General Appearance: Alert, cooperative, no distress, appears stated age Head: Normocephalic, without obvious abnormality, atraumatic Eyes: PERRL, conjunctiva/corneas clear, EOM's intact, fundi benign, both eyes      Ears: Normal TM's and external ear canals, both ears Throat: Lips, mucosa, and tongue normal; teeth and gums normal Neck: Supple, symmetrical, trachea midline, no adenopathy; thyroid: No enlargement/tenderness/nodules; no carotid bruit or JVD Back: Symmetric, no curvature, ROM normal, no CVA tenderness his incisions are well-healed. Lungs: Clear to auscultation bilaterally, respirations unlabored Heart: Regular rate and rhythm, S1 and S2 normal, no murmur, rub or gallop Abdomen: Soft, non-tender, bowel  sounds active all four quadrants, no masses, no organomegaly Extremities: Extremities normal, atraumatic, no cyanosis or edema Pulses: 2+ and symmetric all extremities Skin: Skin color, texture, turgor normal, no rashes or lesions  NEUROLOGIC:   Mental status: alert and oriented, no aphasia, good attention span, Fund of knowledge/ memory ok Motor Exam - grossly normal Sensory Exam - grossly normal Reflexes:  Coordination - grossly normal Gait - grossly normal Balance - grossly normal Cranial Nerves: I: smell Not  tested  II: visual acuity  OS: Normal    OD: Normal   II: visual fields Full to confrontation  II: pupils Equal, round, reactive to light  III,VII: ptosis None  III,IV,VI: extraocular muscles  Full ROM  V: mastication Normal  V: facial light touch sensation  Normal  V,VII: corneal reflex  Present  VII: facial muscle function - upper  Normal  VII: facial muscle function - lower Normal  VIII: hearing Not tested  IX: soft palate elevation  Normal  IX,X: gag reflex Present  XI: trapezius strength  5/5  XI: sternocleidomastoid strength 5/5  XI: neck flexion strength  5/5  XII: tongue strength  Normal    Data Review Lab Results  Component Value Date   WBC 10.6* 07/28/2010   HGB 15.2 07/28/2010   HCT 45.8 07/28/2010   MCV 88.9 07/28/2010   PLT 323 07/28/2010   Lab Results  Component Value Date   NA 138 07/28/2010   K 4.0 07/28/2010   CL 103 07/28/2010   CO2 26 07/28/2010   BUN 12 07/28/2010   CREATININE 1.18 07/28/2010   GLUCOSE 108* 07/28/2010   Lab Results  Component Value Date   INR 1.0 09/02/2007    Assessment/Plan: Neck and right arm pain: I discussed the situation with the patient his wife. We discussed further workup with a cervical mild CT. I've explained the risks, benefits, and alternatives of this procedure. Advanced all her questions. They would like to proceed with that study.   Shawon Denzer D 08/01/2011 7:25 AM

## 2011-08-07 ENCOUNTER — Telehealth (HOSPITAL_COMMUNITY): Payer: Self-pay

## 2011-08-08 ENCOUNTER — Other Ambulatory Visit: Payer: Self-pay | Admitting: Neurosurgery

## 2011-08-13 ENCOUNTER — Encounter (HOSPITAL_COMMUNITY): Payer: Self-pay

## 2011-08-13 ENCOUNTER — Encounter (HOSPITAL_COMMUNITY)
Admission: RE | Admit: 2011-08-13 | Discharge: 2011-08-13 | Disposition: A | Discharge status: Home / Self Care | Payer: Medicare Other | Source: Ambulatory Visit | Attending: Neurosurgery | Admitting: Neurosurgery

## 2011-08-13 ENCOUNTER — Other Ambulatory Visit: Payer: Self-pay

## 2011-08-13 ENCOUNTER — Encounter (HOSPITAL_COMMUNITY)
Admission: RE | Admit: 2011-08-13 | Discharge: 2011-08-13 | Disposition: A | Discharge status: Home / Self Care | Payer: Medicare Other | Source: Ambulatory Visit | Attending: Anesthesiology | Admitting: Anesthesiology

## 2011-08-13 HISTORY — DX: Headache: R51

## 2011-08-13 HISTORY — DX: Essential (primary) hypertension: I10

## 2011-08-13 HISTORY — DX: Reserved for inherently not codable concepts without codable children: IMO0001

## 2011-08-13 HISTORY — DX: Personal history of other diseases of the digestive system: Z87.19

## 2011-08-13 HISTORY — DX: Malignant (primary) neoplasm, unspecified: C80.1

## 2011-08-13 HISTORY — DX: Encounter for other specified aftercare: Z51.89

## 2011-08-13 HISTORY — DX: Nausea with vomiting, unspecified: R11.2

## 2011-08-13 HISTORY — DX: Other specified postprocedural states: Z98.890

## 2011-08-13 HISTORY — DX: Gastro-esophageal reflux disease without esophagitis: K21.9

## 2011-08-13 HISTORY — DX: Zoster without complications: B02.9

## 2011-08-13 HISTORY — DX: Unspecified osteoarthritis, unspecified site: M19.90

## 2011-08-13 HISTORY — DX: Sleep apnea, unspecified: G47.30

## 2011-08-13 LAB — CBC
HCT: 45.9 % (ref 39.0–52.0)
Hemoglobin: 15.1 g/dL (ref 13.0–17.0)
MCH: 30.1 pg (ref 26.0–34.0)
MCHC: 32.9 g/dL (ref 30.0–36.0)
MCV: 91.6 fL (ref 78.0–100.0)
Platelets: 198 10*3/uL (ref 150–400)
RBC: 5.01 MIL/uL (ref 4.22–5.81)
RDW: 13.8 % (ref 11.5–15.5)
WBC: 6 10*3/uL (ref 4.0–10.5)

## 2011-08-13 LAB — BASIC METABOLIC PANEL
BUN: 15 mg/dL (ref 6–23)
CO2: 27 mEq/L (ref 19–32)
Calcium: 9.7 mg/dL (ref 8.4–10.5)
Chloride: 104 mEq/L (ref 96–112)
Creatinine, Ser: 0.93 mg/dL (ref 0.50–1.35)
GFR calc Af Amer: >90 mL/min (ref >90)
GFR calc non Af Amer: 89 mL/min — ABNORMAL LOW (ref >90)
Glucose, Bld: 96 mg/dL (ref 70–99)
Potassium: 4.6 mEq/L (ref 3.5–5.1)
Sodium: 140 mEq/L (ref 135–145)

## 2011-08-13 LAB — SURGICAL PCR SCREEN
MRSA, PCR: NEGATIVE
Staphylococcus aureus: NEGATIVE

## 2011-08-13 NOTE — Pre-Procedure Instructions (Addendum)
20 [name]  08/13/2011   Your procedure is scheduled on:  3.18.13  Report to [facility] at 900 AM.  Call this number if you have problems the morning of surgery: [phone]   Remember:   Do not eat food:After Midnight.  May have clear liquids: up to 4 Hours before arrival 500 am.  Clear liquids include soda, tea, black coffee, apple or grape juice, broth.  Take these medicines the morning of surgery with A SIP OF WATER:valium, allegra, oxycodone, phenergan ,dexilant STOP all over the counter meds, omega fish oil, multi vit now   Do not wear jewelry, make-up or nail polish.  Do not wear lotions, powders, or perfumes. You may wear deodorant.  Do not shave 48 hours prior to surgery.  Do not bring valuables to the hospital.  Contacts, dentures or bridgework may not be worn into surgery.  Leave suitcase in the car. After surgery it may be brought to your room.  For patients admitted to the hospital, checkout time is 11:00 AM the day of discharge.   Patients discharged the day of surgery will not be allowed to drive home.  Name and phone number of your driver: [place] Sink [phone]  Special Instructions: CHG Shower Use Special Wash: 1/2 bottle night before surgery and 1/2 bottle morning of surgery.   Please read over the following fact sheets that you were given: Pain Booklet, Coughing and Deep Breathing, MRSA Information and Surgical Site Infection Prevention

## 2011-08-13 NOTE — Progress Notes (Signed)
Patient has barretts esophagus, has extreme nausea most of time.

## 2011-08-14 NOTE — Consult Note (Signed)
Anesthesia:  Patient is a 61 year old male scheduled for C3-4, C4-5, C5-6 posterior fusion on 08/18/11.  History includes HTN, GERD/Barrett's esophagus, HLD, former smoker, OSA, headaches, blood transfusion, stage III melanoma s/p excision, history of multiple surgeries including spinal cord stimulator, thyroid lobectomy, chest, leg, knee, and back surgery.   Labs noted.  CXR shows no acute process.    EKG on 08/13/11 shows NSR, LAD, incomplete right BBB, moderate voltage criteria for LVH (may be normal variant).  Overall it appears stable since at least 03/20/10. No CV symptoms reported at PAT.  Anticipate if he remains asymptomatic that he can proceed as planned.

## 2011-08-17 MED ORDER — CEFAZOLIN SODIUM 1-5 GM-% IV SOLN: 1.0000 g | INTRAVENOUS | Status: DC

## 2011-08-17 MED ORDER — CEFAZOLIN SODIUM-DEXTROSE 2-3 GM-% IV SOLR
2.0000 g | INTRAVENOUS | Status: AC
  Administered 2011-08-18: 2 g via INTRAVENOUS
  Filled 2011-08-17: qty 50

## 2011-08-18 ENCOUNTER — Inpatient Hospital Stay (HOSPITAL_COMMUNITY): Payer: Medicare Other | Admitting: Vascular Surgery

## 2011-08-18 ENCOUNTER — Inpatient Hospital Stay (HOSPITAL_COMMUNITY): Payer: Medicare Other

## 2011-08-18 ENCOUNTER — Encounter (HOSPITAL_COMMUNITY): Payer: Self-pay | Admitting: *Deleted

## 2011-08-18 ENCOUNTER — Encounter (HOSPITAL_COMMUNITY)
Admission: RE | Disposition: A | Discharge status: Home / Self Care | Payer: Self-pay | Source: Ambulatory Visit | Attending: Neurosurgery

## 2011-08-18 ENCOUNTER — Inpatient Hospital Stay (HOSPITAL_COMMUNITY)
Admission: RE | Admit: 2011-08-18 | Discharge: 2011-08-22 | DRG: 473 | Disposition: A | Discharge status: Home / Self Care | Payer: Medicare Other | Source: Ambulatory Visit | Attending: Neurosurgery | Admitting: Neurosurgery

## 2011-08-18 ENCOUNTER — Encounter (HOSPITAL_COMMUNITY): Payer: Self-pay | Admitting: Vascular Surgery

## 2011-08-18 DIAGNOSIS — Z96659 Presence of unspecified artificial knee joint: Secondary | ICD-10-CM

## 2011-08-18 DIAGNOSIS — Z01818 Encounter for other preprocedural examination: Secondary | ICD-10-CM

## 2011-08-18 DIAGNOSIS — Z01812 Encounter for preprocedural laboratory examination: Secondary | ICD-10-CM

## 2011-08-18 DIAGNOSIS — T40605A Adverse effect of unspecified narcotics, initial encounter: Secondary | ICD-10-CM | POA: Diagnosis present

## 2011-08-18 DIAGNOSIS — Z01810 Encounter for preprocedural cardiovascular examination: Secondary | ICD-10-CM

## 2011-08-18 DIAGNOSIS — M4722 Other spondylosis with radiculopathy, cervical region: Secondary | ICD-10-CM

## 2011-08-18 DIAGNOSIS — Z87891 Personal history of nicotine dependence: Secondary | ICD-10-CM

## 2011-08-18 DIAGNOSIS — M47812 Spondylosis without myelopathy or radiculopathy, cervical region: Principal | ICD-10-CM | POA: Diagnosis present

## 2011-08-18 DIAGNOSIS — Z85820 Personal history of malignant melanoma of skin: Secondary | ICD-10-CM

## 2011-08-18 DIAGNOSIS — R112 Nausea with vomiting, unspecified: Secondary | ICD-10-CM | POA: Diagnosis present

## 2011-08-18 HISTORY — PX: POSTERIOR CERVICAL FUSION/FORAMINOTOMY: SHX5038

## 2011-08-18 SURGERY — POSTERIOR CERVICAL FUSION/FORAMINOTOMY LEVEL 3
Anesthesia: General | Site: Neck | Wound class: Clean

## 2011-08-18 MED ORDER — ROCURONIUM BROMIDE 100 MG/10ML IV SOLN
INTRAVENOUS | Status: DC | PRN
  Administered 2011-08-18: 50 mg via INTRAVENOUS

## 2011-08-18 MED ORDER — CYCLOBENZAPRINE HCL 10 MG PO TABS: 10.0000 mg | ORAL_TABLET | Freq: Three times a day (TID) | ORAL | Status: DC | PRN

## 2011-08-18 MED ORDER — PHENYLEPHRINE HCL 10 MG/ML IJ SOLN
10.0000 mg | INTRAVENOUS | Status: DC | PRN
  Administered 2011-08-18: 25 ug/min via INTRAVENOUS

## 2011-08-18 MED ORDER — HYDROMORPHONE 0.3 MG/ML IV SOLN
INTRAVENOUS | Status: AC
  Administered 2011-08-18: 16:00:00
  Filled 2011-08-18: qty 25

## 2011-08-18 MED ORDER — SODIUM CHLORIDE 0.9 % IR SOLN
Status: DC | PRN
  Administered 2011-08-18: 13:00:00

## 2011-08-18 MED ORDER — DIAZEPAM 5 MG PO TABS
10.0000 mg | ORAL_TABLET | Freq: Four times a day (QID) | ORAL | Status: DC | PRN
Start: 2011-08-18 — End: 2011-08-22
  Administered 2011-08-19 – 2011-08-21 (×4): 10 mg via ORAL
  Filled 2011-08-18 (×4): qty 2

## 2011-08-18 MED ORDER — ONDANSETRON HCL 4 MG/2ML IJ SOLN
4.0000 mg | Freq: Once | INTRAMUSCULAR | Status: AC | PRN
  Administered 2011-08-18: 4 mg via INTRAVENOUS

## 2011-08-18 MED ORDER — THROMBIN 5000 UNITS EX KIT
PACK | CUTANEOUS | Status: DC | PRN
  Administered 2011-08-18: 5000 [IU] via TOPICAL

## 2011-08-18 MED ORDER — LORATADINE 10 MG PO TABS
10.0000 mg | ORAL_TABLET | Freq: Every day | ORAL | Status: DC
  Administered 2011-08-18 – 2011-08-22 (×4): 10 mg via ORAL
  Filled 2011-08-18 (×7): qty 1

## 2011-08-18 MED ORDER — ONDANSETRON HCL 4 MG/2ML IJ SOLN
4.0000 mg | Freq: Four times a day (QID) | INTRAMUSCULAR | Status: DC | PRN
  Filled 2011-08-18 (×2): qty 2

## 2011-08-18 MED ORDER — DOCUSATE SODIUM 100 MG PO CAPS
100.0000 mg | ORAL_CAPSULE | Freq: Two times a day (BID) | ORAL | Status: DC
  Administered 2011-08-19 – 2011-08-22 (×7): 100 mg via ORAL
  Filled 2011-08-18 (×6): qty 1

## 2011-08-18 MED ORDER — OXYCODONE-ACETAMINOPHEN 5-325 MG PO TABS
1.0000 | ORAL_TABLET | ORAL | Status: DC | PRN
  Administered 2011-08-19: 1 via ORAL
  Administered 2011-08-19 – 2011-08-20 (×6): 2 via ORAL
  Administered 2011-08-20: 1 via ORAL
  Administered 2011-08-21 (×4): 2 via ORAL
  Filled 2011-08-18 (×8): qty 2
  Filled 2011-08-18: qty 1
  Filled 2011-08-18 (×3): qty 2

## 2011-08-18 MED ORDER — ONDANSETRON HCL 4 MG/2ML IJ SOLN
INTRAMUSCULAR | Status: AC
  Administered 2011-08-18: 4 mg via INTRAVENOUS
  Filled 2011-08-18: qty 2

## 2011-08-18 MED ORDER — BUPIVACAINE LIPOSOME 1.3 % IJ SUSP
20.0000 mL | Freq: Once | INTRAMUSCULAR | Status: DC
  Filled 2011-08-18: qty 20

## 2011-08-18 MED ORDER — ADULT MULTIVITAMIN W/MINERALS CH
1.0000 | ORAL_TABLET | Freq: Every day | ORAL | Status: DC
  Administered 2011-08-18 – 2011-08-22 (×5): 1 via ORAL
  Filled 2011-08-18 (×6): qty 1

## 2011-08-18 MED ORDER — CEFAZOLIN SODIUM-DEXTROSE 2-3 GM-% IV SOLR
2.0000 g | Freq: Three times a day (TID) | INTRAVENOUS | Status: AC
  Administered 2011-08-18 – 2011-08-19 (×2): 2 g via INTRAVENOUS
  Filled 2011-08-18 (×2): qty 50

## 2011-08-18 MED ORDER — PHENOL 1.4 % MT LIQD: 1.0000 | OROMUCOSAL | Status: DC | PRN

## 2011-08-18 MED ORDER — DIPHENHYDRAMINE HCL 50 MG/ML IJ SOLN: 12.5000 mg | Freq: Four times a day (QID) | INTRAMUSCULAR | Status: DC | PRN

## 2011-08-18 MED ORDER — SODIUM CHLORIDE 0.9 % IV SOLN
INTRAVENOUS | Status: AC
  Filled 2011-08-18: qty 500

## 2011-08-18 MED ORDER — BUPIVACAINE LIPOSOME 1.3 % IJ SUSP
INTRAMUSCULAR | Status: DC | PRN
  Administered 2011-08-18: 20 mL

## 2011-08-18 MED ORDER — SODIUM CHLORIDE 0.9 % IJ SOLN: 9.0000 mL | INTRAMUSCULAR | Status: DC | PRN

## 2011-08-18 MED ORDER — ONDANSETRON HCL 4 MG/2ML IJ SOLN
4.0000 mg | INTRAMUSCULAR | Status: DC | PRN
  Administered 2011-08-18 – 2011-08-22 (×16): 4 mg via INTRAVENOUS
  Filled 2011-08-18 (×14): qty 2

## 2011-08-18 MED ORDER — MENTHOL 3 MG MT LOZG
1.0000 | LOZENGE | OROMUCOSAL | Status: DC | PRN
  Filled 2011-08-18: qty 9

## 2011-08-18 MED ORDER — LACTATED RINGERS IV SOLN: INTRAVENOUS | Status: DC

## 2011-08-18 MED ORDER — LACTATED RINGERS IV SOLN
INTRAVENOUS | Status: DC | PRN
  Administered 2011-08-18: 12:00:00 via INTRAVENOUS

## 2011-08-18 MED ORDER — MIDAZOLAM HCL 5 MG/5ML IJ SOLN
INTRAMUSCULAR | Status: DC | PRN
  Administered 2011-08-18: 2 mg via INTRAVENOUS

## 2011-08-18 MED ORDER — ACETAMINOPHEN 325 MG PO TABS: 650.0000 mg | ORAL_TABLET | ORAL | Status: DC | PRN

## 2011-08-18 MED ORDER — GLYCOPYRROLATE 0.2 MG/ML IJ SOLN
INTRAMUSCULAR | Status: DC | PRN
  Administered 2011-08-18: .2 mg via INTRAVENOUS
  Administered 2011-08-18: 0.6 mg via INTRAVENOUS

## 2011-08-18 MED ORDER — 0.9 % SODIUM CHLORIDE (POUR BTL) OPTIME
TOPICAL | Status: DC | PRN
  Administered 2011-08-18: 1000 mL

## 2011-08-18 MED ORDER — MORPHINE SULFATE 2 MG/ML IJ SOLN: 0.0500 mg/kg | INTRAMUSCULAR | Status: DC | PRN

## 2011-08-18 MED ORDER — DEXAMETHASONE SODIUM PHOSPHATE 4 MG/ML IJ SOLN
INTRAMUSCULAR | Status: DC | PRN
  Administered 2011-08-18: 8 mg via INTRAVENOUS

## 2011-08-18 MED ORDER — HYDROCODONE-ACETAMINOPHEN 5-325 MG PO TABS: 1.0000 | ORAL_TABLET | ORAL | Status: DC | PRN

## 2011-08-18 MED ORDER — BACITRACIN 50000 UNITS IM SOLR
INTRAMUSCULAR | Status: AC
  Filled 2011-08-18: qty 1

## 2011-08-18 MED ORDER — LISINOPRIL 10 MG PO TABS
10.0000 mg | ORAL_TABLET | Freq: Every day | ORAL | Status: DC
  Administered 2011-08-18 – 2011-08-22 (×5): 10 mg via ORAL
  Filled 2011-08-18 (×6): qty 1

## 2011-08-18 MED ORDER — EPHEDRINE SULFATE 50 MG/ML IJ SOLN
INTRAMUSCULAR | Status: DC | PRN
  Administered 2011-08-18: 10 mg via INTRAVENOUS

## 2011-08-18 MED ORDER — HYDROMORPHONE 0.3 MG/ML IV SOLN
INTRAVENOUS | Status: DC
  Administered 2011-08-19: 0.6 mg via INTRAVENOUS

## 2011-08-18 MED ORDER — SCOPOLAMINE 1 MG/3DAYS TD PT72
MEDICATED_PATCH | TRANSDERMAL | Status: DC | PRN
  Administered 2011-08-18: 1.5 mg via TRANSDERMAL

## 2011-08-18 MED ORDER — BUPIVACAINE-EPINEPHRINE PF 0.5-1:200000 % IJ SOLN
INTRAMUSCULAR | Status: DC | PRN
  Administered 2011-08-18: 10 mL

## 2011-08-18 MED ORDER — NALOXONE HCL 0.4 MG/ML IJ SOLN: 0.4000 mg | INTRAMUSCULAR | Status: DC | PRN

## 2011-08-18 MED ORDER — SUCCINYLCHOLINE CHLORIDE 20 MG/ML IJ SOLN
INTRAMUSCULAR | Status: DC | PRN
  Administered 2011-08-18: 100 mg via INTRAVENOUS

## 2011-08-18 MED ORDER — ONDANSETRON HCL 4 MG/2ML IJ SOLN
INTRAMUSCULAR | Status: DC | PRN
  Administered 2011-08-18: 4 mg via INTRAVENOUS

## 2011-08-18 MED ORDER — PROPOFOL 10 MG/ML IV BOLUS
INTRAVENOUS | Status: DC | PRN
  Administered 2011-08-18: 50 mg via INTRAVENOUS
  Administered 2011-08-18: 200 mg via INTRAVENOUS
  Administered 2011-08-18: 50 mg via INTRAVENOUS

## 2011-08-18 MED ORDER — ATORVASTATIN CALCIUM 20 MG PO TABS
20.0000 mg | ORAL_TABLET | Freq: Every day | ORAL | Status: DC
  Administered 2011-08-19 – 2011-08-20 (×2): 20 mg via ORAL
  Filled 2011-08-18 (×4): qty 1

## 2011-08-18 MED ORDER — HYDROMORPHONE HCL PF 1 MG/ML IJ SOLN: 0.2500 mg | INTRAMUSCULAR | Status: DC | PRN

## 2011-08-18 MED ORDER — ACETAMINOPHEN 650 MG RE SUPP: 650.0000 mg | RECTAL | Status: DC | PRN

## 2011-08-18 MED ORDER — NEOSTIGMINE METHYLSULFATE 1 MG/ML IJ SOLN
INTRAMUSCULAR | Status: DC | PRN
  Administered 2011-08-18: 5 mg via INTRAVENOUS

## 2011-08-18 MED ORDER — HEMOSTATIC AGENTS (NO CHARGE) OPTIME
TOPICAL | Status: DC | PRN
  Administered 2011-08-18: 1 via TOPICAL

## 2011-08-18 MED ORDER — MONTELUKAST SODIUM 10 MG PO TABS
10.0000 mg | ORAL_TABLET | Freq: Every day | ORAL | Status: DC
  Administered 2011-08-20 – 2011-08-21 (×2): 10 mg via ORAL
  Filled 2011-08-18 (×6): qty 1

## 2011-08-18 MED ORDER — PROMETHAZINE HCL 25 MG PO TABS
25.0000 mg | ORAL_TABLET | Freq: Four times a day (QID) | ORAL | Status: DC | PRN
  Administered 2011-08-20: 25 mg via ORAL
  Filled 2011-08-18: qty 2

## 2011-08-18 MED ORDER — ZOLPIDEM TARTRATE 10 MG PO TABS
10.0000 mg | ORAL_TABLET | Freq: Every evening | ORAL | Status: DC | PRN
  Filled 2011-08-18: qty 2

## 2011-08-18 MED ORDER — PANTOPRAZOLE SODIUM 40 MG PO TBEC
40.0000 mg | DELAYED_RELEASE_TABLET | Freq: Every day | ORAL | Status: DC
  Administered 2011-08-18 – 2011-08-22 (×5): 40 mg via ORAL
  Filled 2011-08-18 (×5): qty 1

## 2011-08-18 MED ORDER — SUFENTANIL CITRATE 50 MCG/ML IV SOLN
INTRAVENOUS | Status: DC | PRN
  Administered 2011-08-18 (×3): 10 ug via INTRAVENOUS

## 2011-08-18 MED ORDER — DIPHENHYDRAMINE HCL 12.5 MG/5ML PO ELIX
12.5000 mg | ORAL_SOLUTION | Freq: Four times a day (QID) | ORAL | Status: DC | PRN
  Filled 2011-08-18: qty 5

## 2011-08-18 MED ORDER — LACTATED RINGERS IV SOLN
INTRAVENOUS | Status: DC | PRN
  Administered 2011-08-18 (×2): via INTRAVENOUS

## 2011-08-18 MED ORDER — VECURONIUM BROMIDE 10 MG IV SOLR
INTRAVENOUS | Status: DC | PRN
  Administered 2011-08-18: 2 mg via INTRAVENOUS
  Administered 2011-08-18: 1 mg via INTRAVENOUS

## 2011-08-18 SURGICAL SUPPLY — 84 items
ADH SKN CLS APL DERMABOND .7 (GAUZE/BANDAGES/DRESSINGS)
APL SKNCLS STERI-STRIP NONHPOA (GAUZE/BANDAGES/DRESSINGS) ×2
BAG DECANTER FOR FLEXI CONT (MISCELLANEOUS) ×2 IMPLANT
BENZOIN TINCTURE PRP APPL 2/3 (GAUZE/BANDAGES/DRESSINGS) ×2 IMPLANT
BIT DRILL 2.4X (BIT) IMPLANT
BIT DRILL NEURO 2X3.1 SFT TUCH (MISCELLANEOUS) IMPLANT
BIT DRL 2.4X (BIT) ×1
BLADE SURG 11 STRL SS (BLADE) ×1 IMPLANT
BLADE SURG ROTATE 9660 (MISCELLANEOUS) IMPLANT
BLADE ULTRA TIP 2M (BLADE) IMPLANT
BRUSH SCRUB EZ 1% IODOPHOR (MISCELLANEOUS) IMPLANT
BRUSH SCRUB EZ PLAIN DRY (MISCELLANEOUS) IMPLANT
BUR MATCHSTICK NEURO 3.0 LAGG (BURR) ×2 IMPLANT
CANISTER SUCTION 2500CC (MISCELLANEOUS) ×2 IMPLANT
CLOTH BEACON ORANGE TIMEOUT ST (SAFETY) ×2 IMPLANT
CONT SPEC 4OZ CLIKSEAL STRL BL (MISCELLANEOUS) ×2 IMPLANT
Crosslink connector, M ×1 IMPLANT
DECANTER SPIKE VIAL GLASS SM (MISCELLANEOUS) ×1 IMPLANT
DERMABOND ADVANCED (GAUZE/BANDAGES/DRESSINGS)
DERMABOND ADVANCED .7 DNX12 (GAUZE/BANDAGES/DRESSINGS) IMPLANT
DRAPE C-ARM 42X72 X-RAY (DRAPES) ×4 IMPLANT
DRAPE LAPAROTOMY 100X72 PEDS (DRAPES) ×2 IMPLANT
DRAPE MICROSCOPE LEICA (MISCELLANEOUS) IMPLANT
DRAPE POUCH INSTRU U-SHP 10X18 (DRAPES) ×2 IMPLANT
DRILL BIT (BIT) ×2
DRILL NEURO 2X3.1 SOFT TOUCH (MISCELLANEOUS) ×2
ELECT BLADE 4.0 EZ CLEAN MEGAD (MISCELLANEOUS) ×2
ELECT REM PT RETURN 9FT ADLT (ELECTROSURGICAL) ×2
ELECTRODE BLDE 4.0 EZ CLN MEGD (MISCELLANEOUS) IMPLANT
ELECTRODE REM PT RTRN 9FT ADLT (ELECTROSURGICAL) ×1 IMPLANT
GAUZE SPONGE 4X4 16PLY XRAY LF (GAUZE/BANDAGES/DRESSINGS) ×1 IMPLANT
GLOVE BIO SURGEON STRL SZ8.5 (GLOVE) ×3 IMPLANT
GLOVE BIOGEL PI IND STRL 7.5 (GLOVE) IMPLANT
GLOVE BIOGEL PI IND STRL 8 (GLOVE) IMPLANT
GLOVE BIOGEL PI INDICATOR 7.5 (GLOVE) ×1
GLOVE BIOGEL PI INDICATOR 8 (GLOVE) ×4
GLOVE ECLIPSE 7.5 STRL STRAW (GLOVE) ×5 IMPLANT
GLOVE ECLIPSE 8.5 STRL (GLOVE) ×1 IMPLANT
GLOVE EXAM NITRILE LRG STRL (GLOVE) IMPLANT
GLOVE EXAM NITRILE MD LF STRL (GLOVE) ×2 IMPLANT
GLOVE EXAM NITRILE XL STR (GLOVE) IMPLANT
GLOVE EXAM NITRILE XS STR PU (GLOVE) IMPLANT
GLOVE SS BIOGEL STRL SZ 8 (GLOVE) ×1 IMPLANT
GLOVE SUPERSENSE BIOGEL SZ 8 (GLOVE) ×2
GOWN BRE IMP SLV AUR LG STRL (GOWN DISPOSABLE) IMPLANT
GOWN BRE IMP SLV AUR XL STRL (GOWN DISPOSABLE) ×5 IMPLANT
GOWN STRL REIN 2XL LVL4 (GOWN DISPOSABLE) ×4 IMPLANT
HEMOSTAT SURGICEL 2X14 (HEMOSTASIS) ×1 IMPLANT
KIT BASIN OR (CUSTOM PROCEDURE TRAY) ×2 IMPLANT
KIT INFUSE X SMALL 1.4CC (Orthopedic Implant) ×1 IMPLANT
KIT ROOM TURNOVER OR (KITS) ×2 IMPLANT
NDL HYPO 21X1.5 SAFETY (NEEDLE) IMPLANT
NDL SPNL 18GX3.5 QUINCKE PK (NEEDLE) IMPLANT
NEEDLE HYPO 21X1.5 SAFETY (NEEDLE) ×2 IMPLANT
NEEDLE HYPO 22GX1.5 SAFETY (NEEDLE) ×2 IMPLANT
NEEDLE SPNL 18GX3.5 QUINCKE PK (NEEDLE) IMPLANT
NS IRRIG 1000ML POUR BTL (IV SOLUTION) ×2 IMPLANT
PACK LAMINECTOMY NEURO (CUSTOM PROCEDURE TRAY) ×2 IMPLANT
PAD ARMBOARD 7.5X6 YLW CONV (MISCELLANEOUS) ×6 IMPLANT
PATTIES SURGICAL .25X.25 (GAUZE/BANDAGES/DRESSINGS) IMPLANT
PIN MAYFIELD SKULL DISP (PIN) ×2 IMPLANT
PUTTY 10ML ACTIFUSE ABX (Putty) ×1 IMPLANT
ROD VERTEX 3.2MM 240MM (Rod) ×1 IMPLANT
RUBBERBAND STERILE (MISCELLANEOUS) IMPLANT
SCREW CANCELLOUS 3.5X14MM (Screw) ×7 IMPLANT
SCREW SET M6 (Screw) ×8 IMPLANT
SCREW VERTEX 4.0X14MM (Screw) ×1 IMPLANT
SPONGE GAUZE 4X4 12PLY (GAUZE/BANDAGES/DRESSINGS) ×2 IMPLANT
SPONGE LAP 4X18 X RAY DECT (DISPOSABLE) IMPLANT
SPONGE NEURO XRAY DETECT 1X3 (DISPOSABLE) IMPLANT
SPONGE SURGIFOAM ABS GEL SZ50 (HEMOSTASIS) ×1 IMPLANT
STAPLER SKIN PROX WIDE 3.9 (STAPLE) ×1 IMPLANT
STRIP CLOSURE SKIN 1/2X4 (GAUZE/BANDAGES/DRESSINGS) ×2 IMPLANT
SUT ETHILON 2 0 FS 18 (SUTURE) IMPLANT
SUT VIC AB 0 CT1 18XCR BRD8 (SUTURE) ×1 IMPLANT
SUT VIC AB 0 CT1 8-18 (SUTURE) ×2
SUT VIC AB 2-0 CP2 18 (SUTURE) ×3 IMPLANT
SYR 20CC LL (SYRINGE) ×1 IMPLANT
SYR 20ML ECCENTRIC (SYRINGE) ×2 IMPLANT
TAPE CLOTH SURG 4X10 WHT LF (GAUZE/BANDAGES/DRESSINGS) ×1 IMPLANT
TOWEL OR 17X24 6PK STRL BLUE (TOWEL DISPOSABLE) ×2 IMPLANT
TOWEL OR 17X26 10 PK STRL BLUE (TOWEL DISPOSABLE) ×2 IMPLANT
TRAY FOLEY CATH 14FRSI W/METER (CATHETERS) ×1 IMPLANT
WATER STERILE IRR 1000ML POUR (IV SOLUTION) ×2 IMPLANT

## 2011-08-18 NOTE — Anesthesia Preprocedure Evaluation (Addendum)
Anesthesia Evaluation  Patient identified by MRN, date of birth, ID band Patient awake    Reviewed: Allergy & Precautions, H&P , NPO status , Patient's Chart, lab work & pertinent test results  History of Anesthesia Complications (+) PONV  Airway Mallampati: I TM Distance: >3 FB Neck ROM: Full    Dental  (+) Teeth Intact and Dental Advisory Given   Pulmonary sleep apnea ,  Pt has lost 55 lbs in a year and a physician suggested he be tested for OSA prior to weight loss.   He feels he has no issue now. breath sounds clear to auscultation        Cardiovascular hypertension, Pt. on medications Rhythm:Regular Rate:Normal     Neuro/Psych  Headaches,    GI/Hepatic hiatal hernia, GERD-  Medicated and Controlled,  Endo/Other    Renal/GU      Musculoskeletal   Abdominal   Peds  Hematology   Anesthesia Other Findings   Reproductive/Obstetrics                        Anesthesia Physical Anesthesia Plan  ASA: III  Anesthesia Plan: General   Post-op Pain Management:    Induction: Intravenous  Airway Management Planned: Oral ETT  Additional Equipment:   Intra-op Plan:   Post-operative Plan: Extubation in OR  Informed Consent: I have reviewed the patients History and Physical, chart, labs and discussed the procedure including the risks, benefits and alternatives for the proposed anesthesia with the patient or authorized representative who has indicated his/her understanding and acceptance.   Dental advisory given  Plan Discussed with: CRNA, Surgeon and Anesthesiologist  Anesthesia Plan Comments:         Anesthesia Quick Evaluation

## 2011-08-18 NOTE — Preoperative (Signed)
Beta Blockers   Reason not to administer Beta Blockers:Not Applicable 

## 2011-08-18 NOTE — Transfer of Care (Signed)
Immediate Anesthesia Transfer of Care Note  Patient: [name]  Procedure(s) Performed: Procedure(s) (LRB): POSTERIOR CERVICAL FUSION/FORAMINOTOMY LEVEL 3 (N/A)  Patient Location: PACU  Anesthesia Type: General  Level of Consciousness: sedated  Airway & Oxygen Therapy: Patient Spontanous Breathing and Patient connected to face mask oxygen  Post-op Assessment: Report given to PACU RN and Post -op Vital signs reviewed and stable  Post vital signs: Reviewed and stable  Complications: No apparent anesthesia complications

## 2011-08-18 NOTE — Progress Notes (Signed)
Patient ID: [name], male   DOB: Jan 22, 1951, 61 y.o.   MRN: [id number] Subjective: The patient is somnolent but arousable. He is in no apparent distress and having no nausea or vomiting..  Objective: Vital signs in last 24 hours: Temp:  [97 F (36.1 C)-98.1 F (36.7 C)] 97 F (36.1 C) (03/18 1601) Pulse Rate:  [67-77] 77  (03/18 1601) Resp:  [20] 20  (03/18 0910) BP: (146)/(93) 146/93 mmHg (03/18 0910) SpO2:  [94 %-95 %] 95 % (03/18 1601)  Intake/Output from previous day:   Intake/Output this shift: Total I/O In: 2600 [I.V.:2600] Out: 700 [Urine:600; Blood:100]  Physical exam the patient is somnolent. He is moving all 4 extremities. He is arousable.  Lab Results: No results found for this basename: WBC:2,HGB:2,HCT:2,PLT:2 in the last 72 hours BMET No results found for this basename: NA:2,K:2,CL:2,CO2:2,GLUCOSE:2,BUN:2,CREATININE:2,CALCIUM:2 in the last 72 hours  Studies/Results: Dg Cervical Spine 1 View  08/18/2011  *RADIOLOGY REPORT*  Clinical Data: C3-4 through C5-6 posterior cervical fusion  DG CERVICAL SPINE - 1 VIEW  Comparison: CT cervical spine dated 08/01/2011  Findings: Cervical spine visualize to C4 on the lateral view.  Surgical probe posterior to C3-4.  Additional soft tissue spreaders posterior to the C4 vertebral body.  IMPRESSION: Cervical spine localization as above.  Original Report Authenticated By: Charline Bills, M.D.    Assessment/Plan: The patient is doing well.  LOS: 0 days     Alyce Inscore D 08/18/2011, 4:18 PM

## 2011-08-18 NOTE — Op Note (Signed)
Brief history: The patient is a 61 year old white male who has suffered from chronic neck and right trapezius shoulder pain. He has failed medical management was worked up with a cervical myelo CT which demonstrated the patient has severe facet arthropathy at C3-4 C4-5 and C5-6. I discussed the various treatment options with the patient including surgery. He has weighed the risks, benefits, and alternatives surgery decided proceed with a C3-4 C4-5 and C5-6 posterior certification fusion and laminectomy.  Preop diagnosis: C3-4, C4-5 and C5-6 facet arthropathy cervicalgia cervical radiculopathy  Postop diagnosis: The same  Procedure: Right C3-4 and C4-5 laminotomy foraminotomy using microdissection; C3-4, C4-5, C5-6 posterior arthrodesis with local morselized autograft bone and Actifusebone extender/bone morphogenic protein; posterior segment vegetation from C3-C6 with [employer] titanium lateral mass screws and rods.  Surgeon: Dr. Delma Officer  Assistant: Dr. Julio Sicks  Anesthesia: Gen. endotracheal  Estimated loss: 125 cc  Specimens: None  Drains: None  Complications: None  Description of procedure: The patient was brought to the operating room by the anesthesia team. General endotracheal anesthesia was induced. I've pointed [place] 3 point headrest the patient's calvarium. The patient was turned to the prone position on the on the chest rolls. The patient's suboccipital region was then shaved with clippers and this as well as his posterior neck and upper thorax was then prepared with Betadine scrub and Betadine solution. Sterile drapes were applied. I then injected the area to be incised with Marcaine with epinephrine solution. I uses scalpel to make him linear midline incision at the cervical thoracic junction. I used electrocautery to perform a bilateral subperiosteal dissection exposing the spinous process lamina from C2 down to C7. We obtained intraoperative radiograph to confirm our  location and then inserted the [facility] retractor for exposure.  We began the decompression by oozing high-speed drill perform a right L3 and L4 laminotomy. We brought the operative microscope into the field and under its medication and illumination we completed a micro-section. We used a Kerrison punch to perform a foraminotomy about the right C4 and C5 nerve root. This completed the decompression.  We now turned attention to the instrumentation. We used light cautery to expose the lateral masses from C3 down to C6. We identified the center of the lateral mass and under fluoroscopic guidance we truly 14 number hold in a cephalad and lateral direction at the lateral masses bilaterally at C3 and C4. Because the patient's shoulders we could not use fluoroscopy at C5 and C6. We used standard anatomic landmarks and again drilled to 14 were called and the lateral mass at C5 and C6 bilaterally. We probed each one of these lateral mass holes and noted there was no cortical breaches. We then inserted a 14 mm polyaxial titanium lateral mass screw into the lateral masses from C3-C6 bilaterally. We then cut cut the rod the appropriate length. We connected the unilateral screws with rod we secured the rod in place with the caps which were tightened appropriately. We then placed a cross connector between the rods and tightened this appropriately. This completed the instrumentation.  We now turned attention to the arthrodesis. We used a high-speed drill to decorticate the remainder of the lateral masses in the lateral aspect of the lamina from C3 and C6. We laid bone morphogenic protein-soaked collagen sponges over these decorticated posterior lateral structures. We then let the late local autograft bone that we obtained during decompression over the lateral masses as well as Actifuse bone graft extender. This completed the arthrodesis at C3-4,  C4-5 and C5-6.  We then obtained hemostasis using bipolar cautery. We  removed the retractor and then reapproximated the patient's cervical thoracic fascia with interrupted 0 Vicryl suture. We approximated the subcutaneous tissue with interrupted 2-0 Vicryl suture. We reapproximated the skin with Steri-Strips and benzoin. The was then coated with bacitracin ointment a sterile dressing applied. The drapes were removed. The patient was socially returned to the supine position and then I removed the Mayfield 3 point headrest from his calvarium. The patient was then extubated by the anesthesia team and transported to the post anesthesia care unit. All sponge instrument and needle needle counts were correct at this case.

## 2011-08-18 NOTE — Progress Notes (Signed)
Pt. Very sleepy, will arrouse and follow commands and falls back asleep, pt. Drops sats and needs to be reminded to breath.

## 2011-08-18 NOTE — Anesthesia Postprocedure Evaluation (Signed)
  Anesthesia Post-op Note  Patient: [name]  Procedure(s) Performed: Procedure(s) (LRB): POSTERIOR CERVICAL FUSION/FORAMINOTOMY LEVEL 3 (N/A)  Patient Location: PACU  Anesthesia Type: General  Level of Consciousness: awake, alert  and oriented  Airway and Oxygen Therapy: Patient Spontanous Breathing and Patient connected to nasal cannula oxygen  Post-op Pain: mild  Post-op Assessment: Post-op Vital signs reviewed, Patient's Cardiovascular Status Stable, Respiratory Function Stable, Patent Airway, No signs of Nausea or vomiting and Pain level controlled  Post-op Vital Signs: Reviewed and stable  Complications: No apparent anesthesia complications

## 2011-08-18 NOTE — H&P (Signed)
Subjective: The patient is a 61 year old white male who has had a chronic history of neck and back pain. He has had multiple spine surgeries. He has developed increasing neck and right trapezius/shoulder pain. He has failed medical management and was worked up with a cervical myelo CT. This demonstrated the patient had severe space at arthropathy at C3-4, C4-5 and C5-6. I discussed the various treatment with the patient and his wife including surgery. The patient has weighed the risks, benefits and alternatives surgery will proceed with a posterior cervical instrumentation and fusion.   Past Medical History  Diagnosis Date  . PONV (postoperative nausea and vomiting)     very sick after anesthesia- barretts esophagus,after last sugery collapsed lung 11  . Sleep apnea     not tested  . Blood transfusion   . Shingles     10  . Cancer     melanoma stage 3  . Hypertension   . GERD (gastroesophageal reflux disease)     barretts esophagus  . Headache   . Arthritis   . H/O hiatal hernia     Past Surgical History  Procedure Date  . Back surgery     x7  . Spinal cord stimulator implant     rt hip  . Melanoma excision     lft abdomen  . Shoulder arthroscopy     lft  . Thyroid lobectomy     rt  . Leg surgery     x4  . Knee arthroplasty     lft  . Abdominal surgery     x3  . Chest surgery     lung incisional hernia for hernia  . Lithotripsy   . Hemorrhoid surgery     x2  . Joint replacement     lft knee  . Hernia repair   . Hiatal hernia repair     Allergies  Allergen Reactions  . Demerol Other (See Comments)    MAKES ME CRAZY!!    History  Substance Use Topics  . Smoking status: Former Smoker -- 5.0 packs/day    Quit date: 08/12/1980  . Smokeless tobacco: Not on file  . Alcohol Use: No    History reviewed. No pertinent family history. Prior to Admission medications   Medication Sig Start Date End Date Taking? Authorizing Provider  calcium-vitamin D (OSCAL WITH D)  500-200 MG-UNIT per tablet Take 1 tablet by mouth every evening.   Yes Historical Provider, MD  CINNAMON PO Take 2 tablets by mouth daily at 12 noon.   Yes Historical Provider, MD  dexlansoprazole (DEXILANT) 60 MG capsule Take 60 mg by mouth daily.   Yes Historical Provider, MD  diazepam (VALIUM) 10 MG tablet Take 10 mg by mouth daily as needed. For nerves/anxiety   Yes Historical Provider, MD  fexofenadine (ALLEGRA) 180 MG tablet Take 180 mg by mouth every evening.   Yes Historical Provider, MD  lisinopril (PRINIVIL,ZESTRIL) 10 MG tablet Take 10 mg by mouth daily.   Yes Historical Provider, MD  oxyCODONE-acetaminophen (TYLOX) 5-500 MG per capsule Take 1-2 capsules by mouth every 4 (four) hours as needed. For pain. Pt takes 1 or 2 tabs as needed for pain   Yes Historical Provider, MD  promethazine (PHENERGAN) 25 MG tablet Take 25-50 mg by mouth every 6 (six) hours as needed. Takes 1 or 2 tabs for nausea   Yes Historical Provider, MD  rosuvastatin (CRESTOR) 10 MG tablet Take 10 mg by mouth daily.   Yes Historical Provider, MD  (  No Medication Selected) Apply 1 application topically 4 (four) times daily. Dual K 90 topical for pain    Historical Provider, MD  famotidine-calcium carbonate-magnesium hydroxide (PEPCID COMPLETE) 10-800-165 MG CHEW Chew 1 tablet by mouth daily as needed.    Historical Provider, MD  montelukast (SINGULAIR) 10 MG tablet Take 10 mg by mouth at bedtime.    Historical Provider, MD  Multiple Vitamin (MULITIVITAMIN WITH MINERALS) TABS Take 1 tablet by mouth daily.    Historical Provider, MD  omega-3 acid ethyl esters (LOVAZA) 1 G capsule Take 1 g by mouth 2 (two) times daily.    Historical Provider, MD     Review of Systems  Positive ROS: As above the patient has chronic neck and back pain.  All other systems have been reviewed and were otherwise negative with the exception of those mentioned in the HPI and as above.  Objective: Vital signs in last 24 hours: Temp:  [98.1 F  (36.7 C)] 98.1 F (36.7 C) (03/18 0910) Pulse Rate:  [67] 67  (03/18 0910) Resp:  [20] 20  (03/18 0910) BP: (146)/(93) 146/93 mmHg (03/18 0910) SpO2:  [94 %] 94 % (03/18 0910)  General Appearance: Alert, cooperative, no distress, appears stated age Head: Normocephalic, without obvious abnormality, atraumatic Eyes: PERRL, conjunctiva/corneas clear, EOM's intact, fundi benign, both eyes      Ears: Normal TM's and external ear canals, both ears Throat: Lips, mucosa, and tongue normal; teeth and gums normal Neck: Supple, symmetrical, trachea midline, no adenopathy; thyroid: No enlargement/tenderness/nodules; no carotid bruit or JVD Back: Symmetric, no curvature, ROM normal, no CVA tenderness the patient's incisions are well-healed. Lungs: Clear to auscultation bilaterally, respirations unlabored Heart: Regular rate and rhythm, S1 and S2 normal, no murmur, rub or gallop Abdomen: Soft, non-tender, bowel sounds active all four quadrants, no masses, no organomegaly Extremities: Extremities normal, atraumatic, no cyanosis or edema Pulses: 2+ and symmetric all extremities Skin: Skin color, texture, turgor normal, no rashes or lesions  NEUROLOGIC:   Mental status: alert and oriented, no aphasia, good attention span, Fund of knowledge/ memory ok Motor Exam - grossly normal Sensory Exam - grossly normal Reflexes: Symmetric Coordination - grossly normal Gait - grossly normal Balance - grossly normal Cranial Nerves: I: smell Not tested  II: visual acuity  OS: Normal    OD: Normal   II: visual fields Full to confrontation  II: pupils Equal, round, reactive to light  III,VII: ptosis None  III,IV,VI: extraocular muscles  Full ROM  V: mastication Normal  V: facial light touch sensation  Normal  V,VII: corneal reflex  Present  VII: facial muscle function - upper  Normal  VII: facial muscle function - lower Normal  VIII: hearing Not tested  IX: soft palate elevation  Normal  IX,X: gag reflex  Present  XI: trapezius strength  5/5  XI: sternocleidomastoid strength 5/5  XI: neck flexion strength  5/5  XII: tongue strength  Normal    Data Review Lab Results  Component Value Date   WBC 6.0 08/13/2011   HGB 15.1 08/13/2011   HCT 45.9 08/13/2011   MCV 91.6 08/13/2011   PLT 198 08/13/2011   Lab Results  Component Value Date   NA 140 08/13/2011   K 4.6 08/13/2011   CL 104 08/13/2011   CO2 27 08/13/2011   BUN 15 08/13/2011   CREATININE 0.93 08/13/2011   GLUCOSE 96 08/13/2011   Lab Results  Component Value Date   INR 1.0 09/02/2007    Assessment/Plan: C3-4,  C4-5, 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 I discussed the situation with the patient and his wife. I reviewed the CAT scan with him. I've pointed out the abnormalities. We have discussed the various treatment options including a C3-4, C5 and C5-6 posterior instrumentation and fusion. I have described the surgery to them. I've shown her surgical models. We have discussed the risks, benefits and alternatives surgery as well as likelihood of achieving our goals with surgery. I have answered all her questions. Patient like to proceed with surgery.   Abdishakur Gottschall D 08/18/2011 11:05 AM

## 2011-08-19 ENCOUNTER — Encounter (HOSPITAL_COMMUNITY): Payer: Self-pay | Admitting: Neurosurgery

## 2011-08-19 MED ORDER — OXYCODONE-ACETAMINOPHEN 5-325 MG PO TABS: 1.0000 | ORAL_TABLET | ORAL | Status: DC | PRN

## 2011-08-19 NOTE — Evaluation (Signed)
Physical Therapy Evaluation Patient Details Name: [name] MRN: [id number] DOB: 1951/05/07 Today's Date: 08/19/2011  Problem List: There is no problem list on file for this patient.   Past Medical History:  Past Medical History  Diagnosis Date  . PONV (postoperative nausea and vomiting)     very sick after anesthesia- barretts esophagus,after last sugery collapsed lung 11  . Sleep apnea     not tested  . Blood transfusion   . Shingles     10  . Cancer     melanoma stage 3  . Hypertension   . GERD (gastroesophageal reflux disease)     barretts esophagus  . Headache   . Arthritis   . H/O hiatal hernia    Past Surgical History:  Past Surgical History  Procedure Date  . Back surgery     x7  . Spinal cord stimulator implant     rt hip  . Melanoma excision     lft abdomen  . Shoulder arthroscopy     lft  . Thyroid lobectomy     rt  . Leg surgery     x4  . Knee arthroplasty     lft  . Abdominal surgery     x3  . Chest surgery     lung incisional hernia for hernia  . Lithotripsy   . Hemorrhoid surgery     x2  . Joint replacement     lft knee  . Hernia repair   . Hiatal hernia repair     PT Assessment/Plan/Recommendation PT Assessment Clinical Impression Statement: Patient presents S/P posterior cervical fusion and laminotomy. He is independent/modified independent with all functional mobilty and has received education for optimal healing. At this time he does not require any PT follow up  - he is safe to mobilize under nursing supervision secondary to lines. PT Recommendation/Assessment: Patent does not need any further PT services No Skilled PT: Patient is modified independent with all activity/mobility;All education completed PT Recommendation Follow Up Recommendations: No PT follow up Equipment Recommended: None recommended by PT  PT Evaluation Precautions/Restrictions  Precautions Precaution Comments: Cervical - handout supplied and educated in  post-op precautions  Required Braces or Orthoses:  (Cervical collar - ad lib) Prior Functioning  Home Living Lives With: Spouse Receives Help From: Family Type of Home: House Home Layout: Two level Alternate Level Stairs-Number of Steps: has stair lift, but does use steps. 16 - no rail but support Home Access: Stairs to enter Entrance Stairs-Rails: None Entrance Stairs-Number of Steps: 2 Bathroom Shower/Tub: Health visitor: Handicapped height Home Adaptive Equipment: Walker - rolling;Straight cane;Shower chair with back;Bedside commode/3-in-1 Prior Function Level of Independence: Independent with basic ADLs;Independent with homemaking with ambulation Driving: Yes Vocation: On disability Comments: Exercise bike at home. Unable to ride bicycle on street. Can mow yard but very painful.  Cognition Cognition Arousal/Alertness: Awake/alert Overall Cognitive Status: Appears within functional limits for tasks assessed Orientation Level: Oriented X4 Sensation/Coordination Sensation Light Touch: Appears Intact Coordination Gross Motor Movements are Fluid and Coordinated: Yes Fine Motor Movements are Fluid and Coordinated: Yes Extremity Assessment RLE Assessment RLE Assessment: Within Functional Limits LLE Assessment LLE Assessment: Within Functional Limits Mobility (including Balance) Bed Mobility Bed Mobility: Yes Rolling Right: 6: Modified independent (Device/Increase time) Right Sidelying to Sit: 6: Modified independent (Device/Increase time) Sitting - Scoot to Edge of Bed: 6: Modified independent (Device/Increase time) Sit to Sidelying Right: 6: Modified independent (Device/Increase time) Transfers Transfers: Yes Sit to Stand: 6: Modified  independent (Device/Increase time);From bed;Without upper extremity assist Stand to Sit: 6: Modified independent (Device/Increase time);To bed;Without upper extremity assist Ambulation/Gait Ambulation/Gait:  Yes Ambulation/Gait Assistance: 7: Independent Ambulation Distance (Feet): 1000 Feet Assistive device: None Gait Pattern: Within Functional Limits Stairs: Yes Stairs Assistance: 7: Independent Stair Management Technique: One rail Left;Alternating pattern;Forwards Number of Stairs: 8   Posture/Postural Control Posture/Postural Control: No significant limitations End of Session PT - End of Session Equipment Utilized During Treatment: Gait belt;Cervical collar (Cervical collar on for gait for comfort) Activity Tolerance: Patient tolerated treatment well Patient left: in bed;with call bell in reach;with family/visitor present Nurse Communication: Mobility status for ambulation General Behavior During Session: [facility] for tasks performed Cognition: [facility] for tasks performed  Edwyna Perfect, PT  Pager [phone]  08/19/2011, 9:33 AM

## 2011-08-19 NOTE — Progress Notes (Signed)
UR COMPLETED  

## 2011-08-19 NOTE — Progress Notes (Signed)
OT NOTE: OT consult received and appreciated. Pt. Completing ADLs at Mod I level and with no acute OT needs. Will sign off acutely. Thanks!  Cassandria Anger, OTR/L Pager: [phone] 08/19/2011 .

## 2011-08-19 NOTE — Progress Notes (Signed)
Patient ID: [name], male   DOB: 11-Apr-1951, 61 y.o.   MRN: [id number] Subjective:  The patient is alert and pleasant. He is not using the Dilaudid PCA because it makes him nauseated. He looks well.  Objective: Vital signs in last 24 hours: Temp:  [97 F (36.1 C)-98.3 F (36.8 C)] 97.4 F (36.3 C) (03/19 0340) Pulse Rate:  [55-88] 76  (03/19 0340) Resp:  [10-22] 15  (03/19 0357) BP: (111-154)/(61-97) 137/85 mmHg (03/19 0340) SpO2:  [90 %-98 %] 98 % (03/19 0357) Weight:  [104.2 kg (229 lb 11.5 oz)] 104.2 kg (229 lb 11.5 oz) (03/18 1855)  Intake/Output from previous day: 03/18 0701 - 03/19 0700 In: 3554 [I.V.:3500; IV Piggyback:54] Out: 1350 [Urine:1250; Blood:100] Intake/Output this shift:    Physical exam the patient is alert and oriented. His motor strength is normal in all 4 extremities. His dressing has a bloodstained.  Lab Results: No results found for this basename: WBC:2,HGB:2,HCT:2,PLT:2 in the last 72 hours BMET No results found for this basename: NA:2,K:2,CL:2,CO2:2,GLUCOSE:2,BUN:2,CREATININE:2,CALCIUM:2 in the last 72 hours  Studies/Results: Dg Cervical Spine 1 View  08/18/2011  *RADIOLOGY REPORT*  Clinical Data: C3-4 through C5-6 posterior cervical fusion  DG CERVICAL SPINE - 1 VIEW  Comparison: CT cervical spine dated 08/01/2011  Findings: Cervical spine visualize to C4 on the lateral view.  Surgical probe posterior to C3-4.  Additional soft tissue spreaders posterior to the C4 vertebral body.  IMPRESSION: Cervical spine localization as above.  Original Report Authenticated By: Charline Bills, M.D.   Dg Cervical Spine 2-3 Views  08/18/2011  *RADIOLOGY REPORT*  Clinical Data: C3-6 posterior fusion.  CERVICAL SPINE - 2-3 VIEW  Comparison: Intraoperative view of the cervical spine 08/18/2011 1315 hours.  Findings: We are provided two fluoroscopic intraoperative spot views of the cervical spine.  Images demonstrate placement of posterior screws and stabilization  bars.  Superior most screw is at C3.  Inferior most screw appears to be at C6 although visualization is somewhat limited.  IMPRESSION: Posterior cervical fusion as above.  Original Report Authenticated By: Bernadene Bell. Maricela Curet, M.D.    Assessment/Plan: Postop day 1: The patient is doing well. I last nurses to change his dressing. We will discontinue his PCA and Foley catheter. I will likely transferred to the floor tomorrow.  LOS: 1 day     Shaely Gadberry D 08/19/2011, 8:01 AM

## 2011-08-20 NOTE — Progress Notes (Signed)
Patient ID: [name], male   DOB: [birth year], 61 y.o.   MRN: [id number] Subjective:  The patient is alert and pleasant. He looks well.  Objective: Vital signs in last 24 hours: Temp:  [97.8 F (36.6 C)-98.9 F (37.2 C)] 97.8 F (36.6 C) (03/20 0800) Pulse Rate:  [75-84] 84  (03/20 0800) Resp:  [14-19] 19  (03/20 0800) BP: (100-127)/(60-80) 104/67 mmHg (03/20 1103) SpO2:  [95 %-97 %] 95 % (03/20 0800)  Intake/Output from previous day: 03/19 0701 - 03/20 0700 In: 720 [P.O.:720] Out: 2650 [Urine:2650] Intake/Output this shift: Total I/O In: 240 [P.O.:240] Out: -   Physical exam the patient is alert and oriented. He is moving all 4 extremities well. His dressing is clean and dry.  Lab Results: No results found for this basename: WBC:2,HGB:2,HCT:2,PLT:2 in the last 72 hours BMET No results found for this basename: NA:2,K:2,CL:2,CO2:2,GLUCOSE:2,BUN:2,CREATININE:2,CALCIUM:2 in the last 72 hours  Studies/Results: Dg Cervical Spine 1 View  08/18/2011  *RADIOLOGY REPORT*  Clinical Data: C3-4 through C5-6 posterior cervical fusion  DG CERVICAL SPINE - 1 VIEW  Comparison: CT cervical spine dated 08/01/2011  Findings: Cervical spine visualize to C4 on the lateral view.  Surgical probe posterior to C3-4.  Additional soft tissue spreaders posterior to the C4 vertebral body.  IMPRESSION: Cervical spine localization as above.  Original Report Authenticated By: Charline Bills, M.D.   Dg Cervical Spine 2-3 Views  08/18/2011  *RADIOLOGY REPORT*  Clinical Data: C3-6 posterior fusion.  CERVICAL SPINE - 2-3 VIEW  Comparison: Intraoperative view of the cervical spine 08/18/2011 1315 hours.  Findings: We are provided two fluoroscopic intraoperative spot views of the cervical spine.  Images demonstrate placement of posterior screws and stabilization bars.  Superior most screw is at C3.  Inferior most screw appears to be at C6 although visualization is somewhat limited.  IMPRESSION: Posterior cervical  fusion as above.  Original Report Authenticated By: Bernadene Bell. Maricela Curet, M.D.    Assessment/Plan: Postoperative day #2: The patient is doing well I will transfer her to 3000 and likely send him tomorrow.  LOS: 2 days     Zyon Rosser D 08/20/2011, 12:26 PM

## 2011-08-21 NOTE — Progress Notes (Signed)
Patient ID: [name], male   DOB: 1950-10-16, 61 y.o.   MRN: [id number] Subjective:  The patient is alert and pleasant. He complains of nausea and vomiting.  Objective: Vital signs in last 24 hours: Temp:  [97.9 F (36.6 C)-98.7 F (37.1 C)] 97.9 F (36.6 C) (03/21 1200) Resp:  [9-10] 9  (03/21 0800) BP: (117-128)/(73-80) 128/77 mmHg (03/21 1200) SpO2:  [96 %-98 %] 98 % (03/21 0040)  Intake/Output from previous day: 03/20 0701 - 03/21 0700 In: 486 [P.O.:480; IV Piggyback:6] Out: 975 [Urine:975] Intake/Output this shift: Total I/O In: 484 [P.O.:480; IV Piggyback:4] Out: 430 [Urine:350; Emesis/NG output:80]  Physical exam the patient is alert and oriented. He is moving all 4 extremities well. His dressing has no blood staining.  Lab Results: No results found for this basename: WBC:2,HGB:2,HCT:2,PLT:2 in the last 72 hours BMET No results found for this basename: NA:2,K:2,CL:2,CO2:2,GLUCOSE:2,BUN:2,CREATININE:2,CALCIUM:2 in the last 72 hours  Studies/Results: No results found.  Assessment/Plan: Postop day #3: The patient has a history of postop nausea and vomiting with each of his surgeries. We will continue to treat him with Zofran and Phenergan as needed. Hopefully this will resolve seen in consent him home tomorrow.  LOS: 3 days     [name] D 08/21/2011, 3:42 PM

## 2011-08-21 NOTE — Progress Notes (Signed)
Pt transferred to unit 3000, report called to RN.

## 2011-08-22 MED ORDER — DSS 100 MG PO CAPS: 100.0000 mg | ORAL_CAPSULE | Freq: Two times a day (BID) | ORAL | Status: AC

## 2011-08-22 MED ORDER — TAPENTADOL HCL 50 MG PO TABS
50.0000 mg | ORAL_TABLET | ORAL | Status: DC | PRN
  Administered 2011-08-22: 50 mg via ORAL
  Filled 2011-08-22: qty 1

## 2011-08-22 MED ORDER — TAPENTADOL HCL 50 MG PO TABS: 50.0000 mg | ORAL_TABLET | ORAL | Status: DC | PRN

## 2011-08-22 NOTE — Discharge Summary (Signed)
Physician Discharge Summary  Patient ID: [name] MRN: [id number] DOB/AGE: [birth year] 60 y.o.  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  Discharge Diagnoses: The same Active Problems:  * No active hospital problems. *    Discharged Condition: good  Hospital Course: I admitted the patient to [facility] on 08/18/2011. On the day of admission I performed a C3-4, C4-5, C5-6 posterior decompression instrumentation and fusion. The surgery went well.  The patient's postoperative course was remarkable only for some persistent nausea and vomiting. The patient feels this is secondary to using Percocet. He changed him over to Nucynta. By 08/22/2011 the patient's nausea vomiting had resolved. He was requesting discharge to home and was discharged home on that day.  The patient was given oral discharge instructions and all his questions were answered.  Consults: None Significant Diagnostic Studies: None Treatments: C3-4, C4-5 and C5-6 posterior decompression, instrumentation and fusion. Discharge Exam: Blood pressure 86/56, pulse 68, temperature 99 F (37.2 C), temperature source Oral, resp. rate 18, height 6' (1.829 m), weight 104.2 kg (229 lb 11.5 oz), SpO2 93.00%. The patient is alert and oriented. His motor strength is normal in all 4 extremities. His dressing is clean and dry.  Disposition: Home  Discharge Orders    Future Orders Please Complete By Expires   Diet - low sodium heart healthy      Increase activity slowly      Discharge instructions      Comments:   Call [phone] for a followup appointment.   Remove dressing in 24 hours      Call MD for:  temperature >100.4      Call MD for:  persistant nausea and vomiting      Call MD for:  severe uncontrolled pain      Call MD for:  redness, tenderness, or signs of infection (pain, swelling, redness, odor or  green/yellow discharge around incision site)      Call MD for:  difficulty breathing, headache or visual disturbances      Call MD for:  hives      Call MD for:  persistant dizziness or light-headedness      Call MD for:  extreme fatigue        Medication List  As of 08/22/2011  7:40 AM   STOP taking these medications         COMPOUNDED TOPICALS BUILDER      oxyCODONE-acetaminophen 5-500 MG per capsule         TAKE these medications         calcium-vitamin D 500-200 MG-UNIT per tablet   Commonly known as: OSCAL WITH D   Take 1 tablet by mouth every evening.      CINNAMON PO   Take 2 tablets by mouth daily at 12 noon.      DEXILANT 60 MG capsule   Generic drug: dexlansoprazole   Take 60 mg by mouth daily.      diazepam 10 MG tablet   Commonly known as: VALIUM   Take 10 mg by mouth daily as needed. For nerves/anxiety      DSS 100 MG Caps   Take 100 mg by mouth 2 (two) times daily.      famotidine-calcium carbonate-magnesium hydroxide 10-800-165 MG Chew   Commonly known as: PEPCID COMPLETE   Chew 1 tablet by mouth daily as needed.      fexofenadine 180 MG tablet  Commonly known as: ALLEGRA   Take 180 mg by mouth every evening.      lisinopril 10 MG tablet   Commonly known as: PRINIVIL,ZESTRIL   Take 10 mg by mouth daily.      montelukast 10 MG tablet   Commonly known as: SINGULAIR   Take 10 mg by mouth at bedtime.      mulitivitamin with minerals Tabs   Take 1 tablet by mouth daily.      omega-3 acid ethyl esters 1 G capsule   Commonly known as: LOVAZA   Take 1 g by mouth 2 (two) times daily.      promethazine 25 MG tablet   Commonly known as: PHENERGAN   Take 25-50 mg by mouth every 6 (six) hours as needed. Takes 1 or 2 tabs for nausea      rosuvastatin 10 MG tablet   Commonly known as: CRESTOR   Take 10 mg by mouth daily.      tapentadol 50 MG Tabs   Commonly known as: NUCYNTA   Take 1 tablet (50 mg total) by mouth every 4 (four) hours as needed.              SignedCristi Loron 08/22/2011, 7:40 AM

## 2012-02-18 ENCOUNTER — Other Ambulatory Visit: Payer: Self-pay | Admitting: Neurosurgery

## 2012-02-25 ENCOUNTER — Encounter (HOSPITAL_COMMUNITY): Payer: Self-pay | Admitting: Pharmacy Technician

## 2012-03-01 ENCOUNTER — Encounter (HOSPITAL_COMMUNITY): Payer: Self-pay

## 2012-03-01 ENCOUNTER — Encounter (HOSPITAL_COMMUNITY)
Admission: RE | Admit: 2012-03-01 | Discharge: 2012-03-01 | Disposition: A | Discharge status: Home / Self Care | Payer: Medicare Other | Source: Ambulatory Visit | Attending: Neurosurgery | Admitting: Neurosurgery

## 2012-03-01 HISTORY — DX: Neoplasm of unspecified behavior of digestive system: D49.0

## 2012-03-01 LAB — BASIC METABOLIC PANEL
BUN: 19 mg/dL (ref 6–23)
CO2: 27 mEq/L (ref 19–32)
Calcium: 9.6 mg/dL (ref 8.4–10.5)
Chloride: 104 mEq/L (ref 96–112)
Creatinine, Ser: 1.08 mg/dL (ref 0.50–1.35)
GFR calc Af Amer: 84 mL/min — ABNORMAL LOW (ref >90)
GFR calc non Af Amer: 73 mL/min — ABNORMAL LOW (ref >90)
Glucose, Bld: 96 mg/dL (ref 70–99)
Potassium: 4.6 mEq/L (ref 3.5–5.1)
Sodium: 139 mEq/L (ref 135–145)

## 2012-03-01 LAB — CBC
HCT: 45.5 % (ref 39.0–52.0)
Hemoglobin: 15.3 g/dL (ref 13.0–17.0)
MCH: 30.6 pg (ref 26.0–34.0)
MCHC: 33.6 g/dL (ref 30.0–36.0)
MCV: 91 fL (ref 78.0–100.0)
Platelets: 173 10*3/uL (ref 150–400)
RBC: 5 MIL/uL (ref 4.22–5.81)
RDW: 13.8 % (ref 11.5–15.5)
WBC: 5.9 10*3/uL (ref 4.0–10.5)

## 2012-03-01 LAB — SURGICAL PCR SCREEN
MRSA, PCR: NEGATIVE
Staphylococcus aureus: NEGATIVE

## 2012-03-01 NOTE — Progress Notes (Signed)
Ekg, cxr 3/13

## 2012-03-01 NOTE — Pre-Procedure Instructions (Addendum)
20 [name]  03/01/2012   Your procedure is scheduled on: 03/11/12  Report to [facility] at [phone].  Call this number if you have problems the morning of surgery: [phone]   Remember:   Do not eat food or drinkAfter Midnight.    Take these medicines the morning of surgery with A SIP OF WATER: valium, singulair, pain med  STOP multi vit, omega 3, 10/3   Do not wear jewelry, make-up or nail polish.  Do not wear lotions, powders, or perfumes.   Do not shave 48 hours prior to surgery. Men may shave face and neck.  Do not bring valuables to the hospital.  Contacts, dentures or bridgework may not be worn into surgery.  Leave suitcase in the car. After surgery it may be brought to your room.  For patients admitted to the hospital, checkout time is 11:00 AM the day of discharge.   Patients discharged the day of surgery will not be allowed to drive home.  Name and phone number of your driver: Kingsbury Sink [phone]  Special Instructions: Shower using CHG 2 nights before surgery and the night before surgery.  If you shower the day of surgery use CHG.  Use special wash - you have one bottle of CHG for all showers.  You should use approximately 1/3 of the bottle for each shower.   Please read over the following fact sheets that you were given: Pain Booklet, Coughing and Deep Breathing, Blood Transfusion Information, MRSA Information and Surgical Site Infection Prevention

## 2012-03-10 MED ORDER — HEPARIN SODIUM (PORCINE) 1000 UNIT/ML IJ SOLN
INTRAMUSCULAR | Status: AC
  Filled 2012-03-10: qty 1

## 2012-03-10 MED ORDER — CEFAZOLIN SODIUM-DEXTROSE 2-3 GM-% IV SOLR
2.0000 g | INTRAVENOUS | Status: AC
  Administered 2012-03-11: 2 g via INTRAVENOUS
  Filled 2012-03-10: qty 50

## 2012-03-11 ENCOUNTER — Encounter (HOSPITAL_COMMUNITY): Payer: Self-pay | Admitting: Anesthesiology

## 2012-03-11 ENCOUNTER — Inpatient Hospital Stay (HOSPITAL_COMMUNITY)
Admission: RE | Admit: 2012-03-11 | Discharge: 2012-03-15 | DRG: 460 | Disposition: A | Discharge status: Home / Self Care | Payer: Medicare Other | Source: Ambulatory Visit | Attending: Neurosurgery | Admitting: Neurosurgery

## 2012-03-11 ENCOUNTER — Encounter (HOSPITAL_COMMUNITY): Payer: Self-pay | Admitting: *Deleted

## 2012-03-11 ENCOUNTER — Inpatient Hospital Stay (HOSPITAL_COMMUNITY): Payer: Medicare Other

## 2012-03-11 ENCOUNTER — Encounter (HOSPITAL_COMMUNITY)
Admission: RE | Disposition: A | Discharge status: Home / Self Care | Payer: Self-pay | Source: Ambulatory Visit | Attending: Neurosurgery

## 2012-03-11 ENCOUNTER — Inpatient Hospital Stay (HOSPITAL_COMMUNITY): Payer: Medicare Other | Admitting: Anesthesiology

## 2012-03-11 DIAGNOSIS — Z96659 Presence of unspecified artificial knee joint: Secondary | ICD-10-CM

## 2012-03-11 DIAGNOSIS — M5137 Other intervertebral disc degeneration, lumbosacral region: Principal | ICD-10-CM | POA: Diagnosis present

## 2012-03-11 DIAGNOSIS — I10 Essential (primary) hypertension: Secondary | ICD-10-CM | POA: Diagnosis present

## 2012-03-11 DIAGNOSIS — Z85820 Personal history of malignant melanoma of skin: Secondary | ICD-10-CM

## 2012-03-11 DIAGNOSIS — M51379 Other intervertebral disc degeneration, lumbosacral region without mention of lumbar back pain or lower extremity pain: Principal | ICD-10-CM | POA: Diagnosis present

## 2012-03-11 DIAGNOSIS — Z87891 Personal history of nicotine dependence: Secondary | ICD-10-CM

## 2012-03-11 DIAGNOSIS — M48062 Spinal stenosis, lumbar region with neurogenic claudication: Secondary | ICD-10-CM

## 2012-03-11 LAB — TYPE AND SCREEN
ABO/RH(D): A POS
Antibody Screen: NEGATIVE
DAT, IgG: NEGATIVE

## 2012-03-11 SURGERY — POSTERIOR LUMBAR FUSION 1 WITH HARDWARE REMOVAL
Anesthesia: General | Wound class: Clean

## 2012-03-11 MED ORDER — HYDROMORPHONE 0.3 MG/ML IV SOLN
INTRAVENOUS | Status: AC
  Filled 2012-03-11: qty 25

## 2012-03-11 MED ORDER — HYDROMORPHONE HCL PF 1 MG/ML IJ SOLN
INTRAMUSCULAR | Status: AC
  Filled 2012-03-11: qty 1

## 2012-03-11 MED ORDER — SODIUM CHLORIDE 0.9 % IJ SOLN: 9.0000 mL | INTRAMUSCULAR | Status: DC | PRN

## 2012-03-11 MED ORDER — ONDANSETRON HCL 4 MG/2ML IJ SOLN
INTRAMUSCULAR | Status: DC | PRN
  Administered 2012-03-11: 4 mg via INTRAVENOUS

## 2012-03-11 MED ORDER — GLYCOPYRROLATE 0.2 MG/ML IJ SOLN
INTRAMUSCULAR | Status: DC | PRN
  Administered 2012-03-11: 0.4 mg via INTRAVENOUS

## 2012-03-11 MED ORDER — DIPHENHYDRAMINE HCL 50 MG/ML IJ SOLN: 12.5000 mg | Freq: Four times a day (QID) | INTRAMUSCULAR | Status: DC | PRN

## 2012-03-11 MED ORDER — PHENOL 1.4 % MT LIQD: 1.0000 | OROMUCOSAL | Status: DC | PRN

## 2012-03-11 MED ORDER — BACITRACIN 50000 UNITS IM SOLR
INTRAMUSCULAR | Status: AC
  Filled 2012-03-11: qty 1

## 2012-03-11 MED ORDER — HEMOSTATIC AGENTS (NO CHARGE) OPTIME
TOPICAL | Status: DC | PRN
  Administered 2012-03-11: 1 via TOPICAL

## 2012-03-11 MED ORDER — LIDOCAINE HCL (CARDIAC) 20 MG/ML IV SOLN
INTRAVENOUS | Status: DC | PRN
  Administered 2012-03-11: 100 mg via INTRAVENOUS

## 2012-03-11 MED ORDER — LACTATED RINGERS IV SOLN
INTRAVENOUS | Status: DC
  Administered 2012-03-11: 1000 mL via INTRAVENOUS

## 2012-03-11 MED ORDER — HYDROMORPHONE HCL PF 1 MG/ML IJ SOLN
INTRAMUSCULAR | Status: DC | PRN
  Administered 2012-03-11 (×4): .25 mg via INTRAVENOUS

## 2012-03-11 MED ORDER — PROMETHAZINE HCL 25 MG PO TABS
25.0000 mg | ORAL_TABLET | Freq: Four times a day (QID) | ORAL | Status: DC | PRN
  Administered 2012-03-11: 50 mg via ORAL
  Administered 2012-03-14: 25 mg via ORAL
  Filled 2012-03-11: qty 1
  Filled 2012-03-11: qty 2

## 2012-03-11 MED ORDER — MIDAZOLAM HCL 5 MG/5ML IJ SOLN
INTRAMUSCULAR | Status: DC | PRN
  Administered 2012-03-11: 2 mg via INTRAVENOUS

## 2012-03-11 MED ORDER — SCOPOLAMINE 1 MG/3DAYS TD PT72
MEDICATED_PATCH | TRANSDERMAL | Status: DC | PRN
  Administered 2012-03-11: 1.5 mg via TRANSDERMAL

## 2012-03-11 MED ORDER — ONDANSETRON HCL 4 MG/2ML IJ SOLN
INTRAMUSCULAR | Status: AC
  Filled 2012-03-11: qty 2

## 2012-03-11 MED ORDER — ACETAMINOPHEN 325 MG PO TABS: 650.0000 mg | ORAL_TABLET | ORAL | Status: DC | PRN

## 2012-03-11 MED ORDER — OXYCODONE-ACETAMINOPHEN 10-325 MG PO TABS: 0.5000 | ORAL_TABLET | ORAL | Status: DC | PRN

## 2012-03-11 MED ORDER — NEOSTIGMINE METHYLSULFATE 1 MG/ML IJ SOLN
INTRAMUSCULAR | Status: DC | PRN
  Administered 2012-03-11: 3 mg via INTRAVENOUS

## 2012-03-11 MED ORDER — LACTATED RINGERS IV SOLN
INTRAVENOUS | Status: DC | PRN
  Administered 2012-03-11 (×3): via INTRAVENOUS

## 2012-03-11 MED ORDER — DEXAMETHASONE SODIUM PHOSPHATE 4 MG/ML IJ SOLN
INTRAMUSCULAR | Status: DC | PRN
  Administered 2012-03-11: 8 mg via INTRAVENOUS

## 2012-03-11 MED ORDER — HYDROMORPHONE 0.3 MG/ML IV SOLN
INTRAVENOUS | Status: DC
  Administered 2012-03-11: 17:00:00 via INTRAVENOUS

## 2012-03-11 MED ORDER — SODIUM CHLORIDE 0.9 % IV SOLN
INTRAVENOUS | Status: AC
  Filled 2012-03-11: qty 500

## 2012-03-11 MED ORDER — ONDANSETRON HCL 4 MG/2ML IJ SOLN
4.0000 mg | Freq: Once | INTRAMUSCULAR | Status: AC | PRN
  Administered 2012-03-11: 4 mg via INTRAVENOUS

## 2012-03-11 MED ORDER — 0.9 % SODIUM CHLORIDE (POUR BTL) OPTIME
TOPICAL | Status: DC | PRN
  Administered 2012-03-11: 1000 mL

## 2012-03-11 MED ORDER — MONTELUKAST SODIUM 10 MG PO TABS
10.0000 mg | ORAL_TABLET | Freq: Every day | ORAL | Status: DC
  Administered 2012-03-12 – 2012-03-15 (×4): 10 mg via ORAL
  Filled 2012-03-11 (×4): qty 1

## 2012-03-11 MED ORDER — EPHEDRINE SULFATE 50 MG/ML IJ SOLN
INTRAMUSCULAR | Status: DC | PRN
Start: 2012-03-11 — End: 2012-03-11
  Administered 2012-03-11 (×4): 10 mg via INTRAVENOUS

## 2012-03-11 MED ORDER — LISINOPRIL 10 MG PO TABS
10.0000 mg | ORAL_TABLET | Freq: Every day | ORAL | Status: DC
  Administered 2012-03-12 – 2012-03-15 (×2): 10 mg via ORAL
  Filled 2012-03-11 (×4): qty 1

## 2012-03-11 MED ORDER — PANTOPRAZOLE SODIUM 40 MG PO TBEC
40.0000 mg | DELAYED_RELEASE_TABLET | Freq: Every day | ORAL | Status: DC
  Administered 2012-03-12 – 2012-03-14 (×3): 40 mg via ORAL
  Filled 2012-03-11 (×3): qty 1

## 2012-03-11 MED ORDER — ACETAMINOPHEN 650 MG RE SUPP: 650.0000 mg | RECTAL | Status: DC | PRN

## 2012-03-11 MED ORDER — BACITRACIN ZINC 500 UNIT/GM EX OINT
TOPICAL_OINTMENT | CUTANEOUS | Status: DC | PRN
  Administered 2012-03-11: 1 via TOPICAL

## 2012-03-11 MED ORDER — ONDANSETRON HCL 4 MG/2ML IJ SOLN: 4.0000 mg | Freq: Four times a day (QID) | INTRAMUSCULAR | Status: DC | PRN

## 2012-03-11 MED ORDER — OXYCODONE-ACETAMINOPHEN 5-325 MG PO TABS
0.5000 | ORAL_TABLET | ORAL | Status: DC | PRN
  Administered 2012-03-11 – 2012-03-15 (×14): 1 via ORAL
  Filled 2012-03-11 (×14): qty 1

## 2012-03-11 MED ORDER — PROPOFOL 10 MG/ML IV BOLUS
INTRAVENOUS | Status: DC | PRN
  Administered 2012-03-11: 80 mg via INTRAVENOUS
  Administered 2012-03-11: 120 mg via INTRAVENOUS

## 2012-03-11 MED ORDER — PROMETHAZINE HCL 25 MG/ML IJ SOLN
6.2500 mg | Freq: Four times a day (QID) | INTRAMUSCULAR | Status: DC | PRN
  Administered 2012-03-11: 6.25 mg via INTRAVENOUS

## 2012-03-11 MED ORDER — PROMETHAZINE HCL 25 MG/ML IJ SOLN
INTRAMUSCULAR | Status: AC
  Filled 2012-03-11: qty 1

## 2012-03-11 MED ORDER — ATORVASTATIN CALCIUM 20 MG PO TABS
20.0000 mg | ORAL_TABLET | Freq: Every day | ORAL | Status: DC
  Filled 2012-03-11 (×2): qty 1

## 2012-03-11 MED ORDER — BUPIVACAINE-EPINEPHRINE PF 0.5-1:200000 % IJ SOLN
INTRAMUSCULAR | Status: DC | PRN
  Administered 2012-03-11: 10 mL

## 2012-03-11 MED ORDER — FENTANYL CITRATE 0.05 MG/ML IJ SOLN
INTRAMUSCULAR | Status: DC | PRN
  Administered 2012-03-11: 100 ug via INTRAVENOUS
  Administered 2012-03-11: 150 ug via INTRAVENOUS

## 2012-03-11 MED ORDER — OXYCODONE HCL 5 MG PO TABS
2.5000 mg | ORAL_TABLET | ORAL | Status: DC | PRN
  Administered 2012-03-12 – 2012-03-15 (×9): 5 mg via ORAL
  Filled 2012-03-11 (×10): qty 1

## 2012-03-11 MED ORDER — ZOLPIDEM TARTRATE 5 MG PO TABS: 5.0000 mg | ORAL_TABLET | Freq: Every evening | ORAL | Status: DC | PRN

## 2012-03-11 MED ORDER — DIPHENHYDRAMINE HCL 12.5 MG/5ML PO ELIX: 12.5000 mg | ORAL_SOLUTION | Freq: Four times a day (QID) | ORAL | Status: DC | PRN

## 2012-03-11 MED ORDER — OXYCODONE HCL 5 MG PO TABS: 5.0000 mg | ORAL_TABLET | Freq: Once | ORAL | Status: DC | PRN

## 2012-03-11 MED ORDER — DOCUSATE SODIUM 100 MG PO CAPS
100.0000 mg | ORAL_CAPSULE | Freq: Two times a day (BID) | ORAL | Status: DC
  Administered 2012-03-12 – 2012-03-15 (×7): 100 mg via ORAL
  Filled 2012-03-11 (×7): qty 1

## 2012-03-11 MED ORDER — ONDANSETRON HCL 4 MG/2ML IJ SOLN
4.0000 mg | INTRAMUSCULAR | Status: DC | PRN
  Administered 2012-03-11: 4 mg via INTRAVENOUS
  Filled 2012-03-11: qty 2

## 2012-03-11 MED ORDER — ROCURONIUM BROMIDE 100 MG/10ML IV SOLN
INTRAVENOUS | Status: DC | PRN
  Administered 2012-03-11: 20 mg via INTRAVENOUS
  Administered 2012-03-11: 30 mg via INTRAVENOUS
  Administered 2012-03-11: 50 mg via INTRAVENOUS

## 2012-03-11 MED ORDER — SCOPOLAMINE 1 MG/3DAYS TD PT72
MEDICATED_PATCH | TRANSDERMAL | Status: AC
  Filled 2012-03-11: qty 1

## 2012-03-11 MED ORDER — HYDROMORPHONE HCL PF 1 MG/ML IJ SOLN: 0.2500 mg | INTRAMUSCULAR | Status: DC | PRN

## 2012-03-11 MED ORDER — LIDOCAINE HCL 4 % MT SOLN
OROMUCOSAL | Status: DC | PRN
  Administered 2012-03-11: 4 mL via TOPICAL

## 2012-03-11 MED ORDER — SODIUM CHLORIDE 0.9 % IR SOLN
Status: DC | PRN
  Administered 2012-03-11: 12:00:00

## 2012-03-11 MED ORDER — NALOXONE HCL 0.4 MG/ML IJ SOLN: 0.4000 mg | INTRAMUSCULAR | Status: DC | PRN

## 2012-03-11 MED ORDER — THROMBIN 20000 UNITS EX SOLR
CUTANEOUS | Status: DC | PRN
  Administered 2012-03-11: 12:00:00 via TOPICAL

## 2012-03-11 MED ORDER — LORATADINE 10 MG PO TABS
10.0000 mg | ORAL_TABLET | Freq: Every day | ORAL | Status: DC
  Administered 2012-03-11 – 2012-03-14 (×4): 10 mg via ORAL
  Filled 2012-03-11 (×5): qty 1

## 2012-03-11 MED ORDER — CEFAZOLIN SODIUM-DEXTROSE 2-3 GM-% IV SOLR
2.0000 g | Freq: Three times a day (TID) | INTRAVENOUS | Status: AC
  Administered 2012-03-11 – 2012-03-12 (×2): 2 g via INTRAVENOUS
  Filled 2012-03-11 (×2): qty 50

## 2012-03-11 MED ORDER — MENTHOL 3 MG MT LOZG
1.0000 | LOZENGE | OROMUCOSAL | Status: DC | PRN
  Filled 2012-03-11 (×2): qty 9

## 2012-03-11 MED ORDER — OXYCODONE HCL 5 MG/5ML PO SOLN: 5.0000 mg | Freq: Once | ORAL | Status: DC | PRN

## 2012-03-11 MED ORDER — ARTIFICIAL TEARS OP OINT
TOPICAL_OINTMENT | OPHTHALMIC | Status: DC | PRN
  Administered 2012-03-11: 1 via OPHTHALMIC

## 2012-03-11 MED ORDER — ADULT MULTIVITAMIN W/MINERALS CH
1.0000 | ORAL_TABLET | Freq: Every day | ORAL | Status: DC
  Administered 2012-03-12 – 2012-03-15 (×3): 1 via ORAL
  Filled 2012-03-11 (×5): qty 1

## 2012-03-11 MED ORDER — DIAZEPAM 5 MG PO TABS: 10.0000 mg | ORAL_TABLET | Freq: Every day | ORAL | Status: DC | PRN

## 2012-03-11 MED ORDER — DIAZEPAM 5 MG PO TABS
10.0000 mg | ORAL_TABLET | Freq: Every day | ORAL | Status: DC | PRN
  Administered 2012-03-12 (×2): 10 mg via ORAL
  Administered 2012-03-13: 5 mg via ORAL
  Filled 2012-03-11: qty 1
  Filled 2012-03-11 (×2): qty 2

## 2012-03-11 SURGICAL SUPPLY — 72 items
APL SKNCLS STERI-STRIP NONHPOA (GAUZE/BANDAGES/DRESSINGS) ×1
BAG DECANTER FOR FLEXI CONT (MISCELLANEOUS) ×2 IMPLANT
BENZOIN TINCTURE PRP APPL 2/3 (GAUZE/BANDAGES/DRESSINGS) ×2 IMPLANT
BLADE SURG ROTATE 9660 (MISCELLANEOUS) IMPLANT
BRUSH SCRUB EZ PLAIN DRY (MISCELLANEOUS) ×2 IMPLANT
BUR ACORN 6.0 (BURR) ×2 IMPLANT
BUR MATCHSTICK NEURO 3.0 LAGG (BURR) ×2 IMPLANT
CANISTER SUCTION 2500CC (MISCELLANEOUS) ×2 IMPLANT
CAP LCK SPNE (Orthopedic Implant) ×2 IMPLANT
CAP LOCK SPINE RADIUS (Orthopedic Implant) IMPLANT
CAP LOCKING (Orthopedic Implant) ×4 IMPLANT
CAP LOCKING 90D (Cap) ×8 IMPLANT
CLOTH BEACON ORANGE TIMEOUT ST (SAFETY) ×2 IMPLANT
CONT SPEC 4OZ CLIKSEAL STRL BL (MISCELLANEOUS) ×2 IMPLANT
COVER BACK TABLE 24X17X13 BIG (DRAPES) IMPLANT
CROSSLINK MEDIUM (Orthopedic Implant) ×1 IMPLANT
DRAPE C-ARM 42X72 X-RAY (DRAPES) ×4 IMPLANT
DRAPE LAPAROTOMY 100X72X124 (DRAPES) ×2 IMPLANT
DRAPE POUCH INSTRU U-SHP 10X18 (DRAPES) ×2 IMPLANT
DRAPE PROXIMA HALF (DRAPES) ×2 IMPLANT
DRAPE SURG 17X23 STRL (DRAPES) ×8 IMPLANT
ELECT BLADE 4.0 EZ CLEAN MEGAD (MISCELLANEOUS) ×2
ELECT REM PT RETURN 9FT ADLT (ELECTROSURGICAL) ×2
ELECTRODE BLDE 4.0 EZ CLN MEGD (MISCELLANEOUS) ×1 IMPLANT
ELECTRODE REM PT RTRN 9FT ADLT (ELECTROSURGICAL) ×1 IMPLANT
GAUZE SPONGE 4X4 16PLY XRAY LF (GAUZE/BANDAGES/DRESSINGS) ×2 IMPLANT
GLOVE BIO SURGEON STRL SZ8.5 (GLOVE) ×4 IMPLANT
GLOVE ECLIPSE 7.5 STRL STRAW (GLOVE) ×2 IMPLANT
GLOVE EXAM NITRILE LRG STRL (GLOVE) IMPLANT
GLOVE EXAM NITRILE MD LF STRL (GLOVE) ×2 IMPLANT
GLOVE EXAM NITRILE XL STR (GLOVE) IMPLANT
GLOVE EXAM NITRILE XS STR PU (GLOVE) IMPLANT
GLOVE INDICATOR 7.5 STRL GRN (GLOVE) ×2 IMPLANT
GLOVE SS BIOGEL STRL SZ 7 (GLOVE) IMPLANT
GLOVE SS BIOGEL STRL SZ 8 (GLOVE) ×2 IMPLANT
GLOVE SUPERSENSE BIOGEL SZ 7 (GLOVE) ×1
GLOVE SUPERSENSE BIOGEL SZ 8 (GLOVE) ×2
GOWN BRE IMP SLV AUR LG STRL (GOWN DISPOSABLE) ×1 IMPLANT
GOWN BRE IMP SLV AUR XL STRL (GOWN DISPOSABLE) ×4 IMPLANT
GOWN STRL REIN 2XL LVL4 (GOWN DISPOSABLE) IMPLANT
KIT BASIN OR (CUSTOM PROCEDURE TRAY) ×2 IMPLANT
KIT ROOM TURNOVER OR (KITS) ×2 IMPLANT
MILL MEDIUM DISP (BLADE) ×1 IMPLANT
NDL HYPO 21X1.5 SAFETY (NEEDLE) IMPLANT
NDL HYPO 25X1 1.5 SAFETY (NEEDLE) ×1 IMPLANT
NEEDLE HYPO 21X1.5 SAFETY (NEEDLE) IMPLANT
NEEDLE HYPO 25X1 1.5 SAFETY (NEEDLE) ×2 IMPLANT
NS IRRIG 1000ML POUR BTL (IV SOLUTION) ×2 IMPLANT
PACK FOAM VITOSS 10CC (Orthopedic Implant) ×1 IMPLANT
PACK LAMINECTOMY NEURO (CUSTOM PROCEDURE TRAY) ×2 IMPLANT
PAD ARMBOARD 7.5X6 YLW CONV (MISCELLANEOUS) ×6 IMPLANT
PATTIES SURGICAL .5 X1 (DISPOSABLE) IMPLANT
PEEK PLIF AVS 12X25X4 (Peek) ×2 IMPLANT
PUTTY 10ML ACTIFUSE ABX (Putty) ×1 IMPLANT
ROD 120MM (Rod) ×4 IMPLANT
ROD SPNL 120X5.5XNS TI RDS (Rod) IMPLANT
SCREW 7.75X50MM (Screw) ×2 IMPLANT
SPONGE GAUZE 4X4 12PLY (GAUZE/BANDAGES/DRESSINGS) ×2 IMPLANT
SPONGE LAP 4X18 X RAY DECT (DISPOSABLE) IMPLANT
SPONGE NEURO XRAY DETECT 1X3 (DISPOSABLE) IMPLANT
SPONGE SURGIFOAM ABS GEL 100 (HEMOSTASIS) ×2 IMPLANT
STRIP CLOSURE SKIN 1/2X4 (GAUZE/BANDAGES/DRESSINGS) ×2 IMPLANT
SUT VIC AB 1 CT1 18XBRD ANBCTR (SUTURE) ×2 IMPLANT
SUT VIC AB 1 CT1 8-18 (SUTURE) ×4
SUT VIC AB 2-0 CP2 18 (SUTURE) ×4 IMPLANT
SYR 20CC LL (SYRINGE) IMPLANT
SYR 20ML ECCENTRIC (SYRINGE) ×2 IMPLANT
TAPE CLOTH SURG 4X10 WHT LF (GAUZE/BANDAGES/DRESSINGS) ×1 IMPLANT
TOWEL OR 17X24 6PK STRL BLUE (TOWEL DISPOSABLE) ×2 IMPLANT
TOWEL OR 17X26 10 PK STRL BLUE (TOWEL DISPOSABLE) ×2 IMPLANT
TRAY FOLEY CATH 14FRSI W/METER (CATHETERS) ×2 IMPLANT
WATER STERILE IRR 1000ML POUR (IV SOLUTION) ×2 IMPLANT

## 2012-03-11 NOTE — Preoperative (Signed)
Beta Blockers   Reason not to administer Beta Blockers:Not Applicable 

## 2012-03-11 NOTE — Anesthesia Procedure Notes (Signed)
Procedure Name: Intubation Date/Time: 03/11/2012 11:49 AM Performed by: Tyrone Nine Pre-anesthesia Checklist: Patient identified, Timeout performed, Emergency Drugs available, Suction available and Patient being monitored Patient Re-evaluated:Patient Re-evaluated prior to inductionOxygen Delivery Method: Circle system utilized Preoxygenation: Pre-oxygenation with 100% oxygen Intubation Type: IV induction Ventilation: Mask ventilation without difficulty Laryngoscope Size: Mac and 3 Grade View: Grade II Tube type: Oral Tube size: 8.0 mm Number of attempts: 1 Airway Equipment and Method: Stylet Placement Confirmation: ETT inserted through vocal cords under direct vision,  positive ETCO2 and CO2 detector Secured at: 22 cm Tube secured with: Tape Dental Injury: Teeth and Oropharynx as per pre-operative assessment

## 2012-03-11 NOTE — Op Note (Signed)
Brief history: The patient is a 60 year old white male who said previous lumbar fusions. He has developed recurrent back and radicular leg pain consistent with neurogenic claudication. He has failed medical management and was worked up with a lumbar myelo CT. This demonstrated this degeneration and spinal stenosis at L2-3. I discussed situation with the patient and his wife. We have discussed the various treatment options including a L2-3 decompression agitation and fusion. The patient has decided to proceed with surgery after weighing the risks, benefits, and alternatives.  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 Etc. requiring a wide decompression of the nerve roots.); L2-3 posterior lumbar interbody fusion with local morselized autograft bone and Actifusebone graft extender; insertion of interbody prosthesis at L2-3 (Stryker peek interbody prosthesis); posterior segmental instrumentation from L2 to S1 with Stryker titanium pedicle screws and rods; posterior lateral arthrodesis at L2-3 with local morselized autograft bone and Vitoss bone graft extender.  Surgeon: Dr. Delma Officer  Asst.: Dr. Orbie Hurst  Anesthesia: Gen. endotracheal  Estimated blood loss: 200 cc  Drains: None  Locations: None  Description of procedure: The patient was brought to the operating room by the anesthesia team. General endotracheal anesthesia was induced. The patient was turned to the prone position on the Wilson frame. The patient's lumbosacral region was then prepared with Betadine scrub and Betadine  solution. Sterile drapes were applied.  I then injected the area to be incised with Marcaine with epinephrine solution. I then used the scalpel to make a linear midline incision over the L2-3 interspace. I then used electrocautery to perform a bilateral subperiosteal dissection exposing the spinous process and lamina of L2 and L3 as well as the old hardware from L3-S1.Marland Kitchen  We then inserted the Verstrac retractor to provide exposure.  I explored the fusion by removing the capsule and the old pedicle screws. I then removed the cross connector and the rods. I attempted to independently move the pedicle screws. They all appeared to be tight and there was no evidence of pseudarthrosis at L3-4, L4-5 or L5-S1.  I began the decompression by using the high speed drill to perform laminotomies at L2. We then used the Kerrison punches to widen the laminotomy and removed the ligamentum flavum at L2-3 as well as remove the cephalad aspect of the L3 lamina. We used the Kerrison punches to remove the medial facets at L2-3. We performed wide foraminotomies about the bilateral L2 and L3 nerve roots completing the decompression.  We now turned our attention to the posterior lumbar interbody fusion. I used a scalpel to incise the intervertebral disc at L2-3. I then performed a partial intervertebral discectomy at L2-3 using the pituitary forceps. We prepared the vertebral endplates at L2-3 for the fusion by removing the soft tissues with the curettes. We then used the trial spacers to pick the appropriate sized interbody prosthesis. We prefilled his prosthesis with a combination of local morselized autograft bone that we obtained during the decompression as well as Actifuse bone graft extender. We inserted the prefilled prosthesis into the interspace at L2-3. There was a good snug fit of the prosthesis in the interspace. We then filled and the  remainder of the intervertebral disc space with local morselized autograft bone and  Actifuse. This completed the posterior lumbar interbody arthrodesis.  We now turned attention to the instrumentation. Under fluoroscopic guidance we cannulated the bilateral L2 pedicles with the bone probe. We then removed the bone probe. We then tapped the pedicle with a 7.25 millimeter tap. We then removed the tap. We probed inside the tapped pedicle with a ball probe to rule out cortical breaches. We then inserted a 7.75 x 50 millimeter pedicle screw into the L2 pedicles bilaterally under fluoroscopic guidance. We then palpated along the medial aspect of the pedicles to rule out cortical breaches. There were none. The nerve roots were not injured. We then connected the unilateral pedicle screws from L2-S1 with a lordotic rod. We compressed the construct and secured the rod in place with the caps. We then tightened the caps appropriately. This completed the instrumentation from L2-S1.  We now turned our attention to the posterior lateral arthrodesis at L2-3. We used the high-speed drill to decorticate the remainder of the facets, pars, transverse process at L2-3. We then applied a combination of local morselized autograft bone and Vitoss bone graft extender over these decorticated posterior lateral structures. This completed the posterior lateral arthrodesis.  We then obtained hemostasis using bipolar electrocautery. We irrigated the wound out with bacitracin solution. We inspected the thecal sac and nerve roots and noted they were well decompressed. We then removed the retractor. We reapproximated patient's thoracolumbar fascia with interrupted #1 Vicryl suture. We reapproximated patient's subcutaneous tissue with interrupted 2-0 Vicryl suture. The reapproximated patient's skin with Steri-Strips and benzoin. The wound was then coated with bacitracin ointment. A sterile dressing was applied. The drapes were removed. The patient was subsequently returned to the supine position where they were extubated by the  anesthesia team. He was then transported to the post anesthesia care unit in stable condition. All sponge instrument and needle counts were reportedly correct at the end of this case.

## 2012-03-11 NOTE — Transfer of Care (Signed)
Immediate Anesthesia Transfer of Care Note  Patient: [name]  Procedure(s) Performed: Procedure(s) (LRB) with comments: POSTERIOR LUMBAR FUSION 1 WITH HARDWARE REMOVAL (N/A) - exploration of lumbar fusion with Lumbar two=three posterior lumbar interbody fusion interbody prothesis posterolateral arthrodesis and posterior segmental instrumentation  Patient Location: PACU  Anesthesia Type: General  Level of Consciousness: alert , sedated and pateint uncooperative  Airway & Oxygen Therapy: Patient Spontanous Breathing and Patient connected to nasal cannula oxygen  Post-op Assessment: Report given to PACU RN and Post -op Vital signs reviewed and stable  Post vital signs: Reviewed and stable  Complications: No apparent anesthesia complications

## 2012-03-11 NOTE — Addendum Note (Signed)
Addendum  created 03/11/12 1740 by Germaine Pomfret, MD   Modules edited:Orders

## 2012-03-11 NOTE — H&P (Signed)
Subjective: The patient is a 61 year old white male who's had multiple prior back surgeries. He has developed recurrent back buttocks and leg pain consistent with neurogenic claudication. He has failed medical management and was worked up with a lumbar MRI. This demonstrated spinal stenosis at L2-3. I discussed the various treatment options with the patient including surgery. The patient has weighed the risks, benefits, and alternatives surgery and decided proceed with a exploration was lumbar fusion with a L2-3 decompression , instrumentation  and fusion.   Past Medical History  Diagnosis Date  . Sleep apnea     not tested  . Blood transfusion   . Shingles     10  . Hypertension   . Headache   . Arthritis   . H/O hiatal hernia   . GERD (gastroesophageal reflux disease)     barretts esophagus  . PONV (postoperative nausea and vomiting)     trouble breathing after last surgery  . Tumor liver     no problems  . Cancer     melanoma stage 3 stomach bx    Past Surgical History  Procedure Date  . Spinal cord stimulator implant     rt hip  . Melanoma excision     lft abdomen  . Shoulder arthroscopy     rt bith defect , spur, rotator cuff  . Thyroid lobectomy     rt  . Leg surgery     x4 crushed   . Knee arthroplasty     lft  . Abdominal surgery     x3  . Chest surgery     lung incisional hernia for hernia  . Lithotripsy   . Hemorrhoid surgery     x2  . Joint replacement     lft knee  . Hernia repair   . Hiatal hernia repair   . Posterior cervical fusion/foraminotomy 08/18/2011    Procedure: POSTERIOR CERVICAL FUSION/FORAMINOTOMY LEVEL 3;  Surgeon: Cristi Loron, MD;  Location: [facility];  Service: Neurosurgery;  Laterality: N/A;  Cervical three-four, four-five, five-six posterior cervical  fusion with instrumentation; Right cervical three-four, four-five laminotomy  . Back surgery     x7    Allergies  Allergen Reactions  . Demerol Other (See Comments)    MAKES  ME CRAZY!!    History  Substance Use Topics  . Smoking status: Former Smoker -- 5.0 packs/day    Types: Cigarettes    Quit date: 08/12/1980  . Smokeless tobacco: Not on file   Comment: occ wine  . Alcohol Use: No    History reviewed. No pertinent family history. Prior to Admission medications   Medication Sig Start Date End Date Taking? Authorizing Provider  calcium-vitamin D (OSCAL WITH D) 500-200 MG-UNIT per tablet Take 1 tablet by mouth every evening.   Yes Historical Provider, MD  dexlansoprazole (DEXILANT) 60 MG capsule Take 60 mg by mouth daily.   Yes Historical Provider, MD  diazepam (VALIUM) 10 MG tablet Take 10 mg by mouth daily as needed. Muscle spasms   Yes Historical Provider, MD  fexofenadine (ALLEGRA) 180 MG tablet Take 180 mg by mouth every evening.   Yes Historical Provider, MD  lisinopril (PRINIVIL,ZESTRIL) 10 MG tablet Take 10 mg by mouth daily.   Yes Historical Provider, MD  montelukast (SINGULAIR) 10 MG tablet Take 10 mg by mouth daily.    Yes Historical Provider, MD  Multiple Vitamin (MULITIVITAMIN WITH MINERALS) TABS Take 1 tablet by mouth daily.   Yes Historical Provider, MD  omega-3 acid ethyl esters (LOVAZA) 1 G capsule Take 1 g by mouth 2 (two) times daily.   Yes Historical Provider, MD  promethazine (PHENERGAN) 25 MG tablet Take 25-50 mg by mouth every 6 (six) hours as needed. tabs for nausea   Yes Historical Provider, MD  rosuvastatin (CRESTOR) 10 MG tablet Take 10 mg by mouth daily.   Yes Historical Provider, MD  oxyCODONE-acetaminophen (PERCOCET) 10-325 MG per tablet Take 0.5-1 tablets by mouth every 5 (five) hours as needed. For pain    Historical Provider, MD  tapentadol (NUCYNTA) 50 MG TABS Take 50 mg by mouth every 4 (four) hours as needed. 08/22/11   Cristi Loron, MD     Review of Systems  Positive ROS: As above  All other systems have been reviewed and were otherwise negative with the exception of those mentioned in the HPI and as  above.  Objective: Vital signs in last 24 hours: Temp:  [97.9 F (36.6 C)] 97.9 F (36.6 C) (10/10 0854) Pulse Rate:  [72] 72  (10/10 0854) Resp:  [18] 18  (10/10 0854) BP: (136)/(85) 136/85 mmHg (10/10 0854) SpO2:  [95 %] 95 % (10/10 0854)  General Appearance: Alert, cooperative, no distress, appears stated age Head: Normocephalic, without obvious abnormality, atraumatic Eyes: PERRL, conjunctiva/corneas clear, EOM's intact, fundi benign, both eyes      Ears: Normal TM's and external ear canals, both ears Throat: Lips, mucosa, and tongue normal; teeth and gums normal Neck: Supple, symmetrical, trachea midline, no adenopathy; thyroid: No enlargement/tenderness/nodules; no carotid bruit or JVD. The patient's cervical incisions are well-healed. Back: Symmetric, no curvature, ROM normal, no CVA tenderness the patient's lumbar incision is well-healed. Lungs: Clear to auscultation bilaterally, respirations unlabored Heart: Regular rate and rhythm, S1 and S2 normal, no murmur, rub or gallop Abdomen: Soft, non-tender, bowel sounds active all four quadrants, no masses, no organomegaly Extremities: Extremities normal, atraumatic, no cyanosis or edema Pulses: 2+ and symmetric all extremities Skin: Skin color, texture, turgor normal, no rashes or lesions  NEUROLOGIC:   Mental status: alert and oriented, no aphasia, good attention span, Fund of knowledge/ memory ok Motor Exam - grossly normal Sensory Exam - grossly normal Reflexes: Symmetric Coordination - grossly normal Gait - grossly normal Balance - grossly normal Cranial Nerves: I: smell Not tested  II: visual acuity  OS: Normal   normal   OD:   II: visual fields Full to confrontation  II: pupils Equal, round, reactive to light  III,VII: ptosis None  III,IV,VI: extraocular muscles  Full ROM  V: mastication Normal  V: facial light touch sensation  Normal  V,VII: corneal reflex  Present  VII: facial muscle function - upper  Normal   VII: facial muscle function - lower Normal  VIII: hearing Not tested  IX: soft palate elevation  Normal  IX,X: gag reflex Present  XI: trapezius strength  5/5  XI: sternocleidomastoid strength 5/5  XI: neck flexion strength  5/5  XII: tongue strength  Normal    Data Review Lab Results  Component Value Date   WBC 5.9 03/01/2012   HGB 15.3 03/01/2012   HCT 45.5 03/01/2012   MCV 91.0 03/01/2012   PLT 173 03/01/2012   Lab Results  Component Value Date   NA 139 03/01/2012   K 4.6 03/01/2012   CL 104 03/01/2012   CO2 27 03/01/2012   BUN 19 03/01/2012   CREATININE 1.08 03/01/2012   GLUCOSE 96 03/01/2012   Lab Results  Component Value Date  INR 1.0 09/02/2007    Assessment/Plan: L2-3 spinal stenosis, lumbar radiculopathy, neurogenic claudication, lumbago: I discussed situation with the patient and his wife. I have reviewed the MR scan with them. We have discussed the various treatment options including surgery. I have described the surgical option of exploration of his lumbar fusion with L2-3 decompression agitation and fusion. I have described the surgery to them I have shown him surgical models. We have discussed the risks, benefits, alternatives, and likelihood of achieving our goals with surgery. I have answered all her questions. The patient has decided proceed with surgery.   Van Ehlert D 03/11/2012 10:54 AM

## 2012-03-11 NOTE — Progress Notes (Signed)
Orthopedic Tech Progress Note Patient Details:  [name] 1950-12-16 [id number]  Patient ID: [name], male   DOB: Feb 16, 1951, 61 y.o.   MRN: [id number]   [name] 03/11/2012, 7:27 AM LS SUPPORT WITH ANT. AND POST. PANELS COMPLETED BY [employer]

## 2012-03-11 NOTE — Anesthesia Preprocedure Evaluation (Addendum)
Anesthesia Evaluation  Patient identified by MRN, date of birth, ID band Patient awake    Reviewed: Allergy & Precautions, H&P , NPO status , Patient's Chart, lab work & pertinent test results  History of Anesthesia Complications (+) PONV  Airway Mallampati: I TM Distance: >3 FB Neck ROM: Full    Dental  (+) Teeth Intact and Dental Advisory Given,    Pulmonary sleep apnea ,   PORTABLE CHEST - 1 VIEW   Comparison: 03/31/2010 and earlier.   Findings: Semi upright AP portable view at 0318 hours.  Upper thoracic fusion hardware and lower thoracic spinal cord stimulator re-identified.  Better lung volumes.   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    No pneumothorax, pulmonary edema, pleural effusion or acute pulmonary opacity.   IMPRESSION: No acute cardiopulmonary abnormality.      Pulmonary exam normal       Cardiovascular Exercise Tolerance: Poor hypertension, Pt. on medications Rhythm:Regular Rate:Normal     Neuro/Psych  Headaches, Spinal Cord Stimulator  Right Hip    GI/Hepatic Neg liver ROS, hiatal hernia, GERD-  Controlled and Medicated,Barrett's Disease   Endo/Other  negative endocrine ROS  Renal/GU negative Renal ROS     Musculoskeletal  (+) Arthritis -, Osteoarthritis,    Abdominal Normal abdominal exam  (+)   Peds  Hematology negative hematology ROS (+)   Anesthesia Other Findings Gum Line surgery in last month.  Pt states it is still not healed.  Reproductive/Obstetrics                        Anesthesia Physical Anesthesia Plan  ASA: II  Anesthesia Plan: General   Post-op Pain Management:    Induction: Intravenous  Airway Management Planned: Oral ETT  Additional Equipment:   Intra-op Plan:   Post-operative Plan: Extubation in OR  Informed Consent: I have reviewed the patients History  and Physical, chart, labs and discussed the procedure including the risks, benefits and alternatives for the proposed anesthesia with the patient or authorized representative who has indicated his/her understanding and acceptance.   Dental advisory given  Plan Discussed with: CRNA, Surgeon and Anesthesiologist  Anesthesia Plan Comments:        Anesthesia Quick Evaluation

## 2012-03-11 NOTE — Anesthesia Postprocedure Evaluation (Signed)
Anesthesia Post Note  Patient: [name]  Procedure(s) Performed: Procedure(s) (LRB): POSTERIOR LUMBAR FUSION 1 WITH HARDWARE REMOVAL (N/A)  Anesthesia type: general  Patient location: PACU  Post pain: Pain level controlled  Post assessment: Patient's Cardiovascular Status Stable  Last Vitals:  Filed Vitals:   03/11/12 1615  BP: 116/43  Pulse: 68  Temp: 36.2 C  Resp: 16    Post vital signs: Reviewed and stable  Level of consciousness: sedated  Complications: No apparent anesthesia complications

## 2012-03-11 NOTE — Progress Notes (Signed)
Patient continue to have episodes of waking up restless, turning from side to side, wanting to stay on one side, as of this time patient is on his R side, resting as of this time.

## 2012-03-11 NOTE — Progress Notes (Signed)
Subjective:  The patient is alert and pleasant. He is in no apparent distress.  Objective: Vital signs in last 24 hours: Temp:  [97.1 F (36.2 C)-97.9 F (36.6 C)] 97.1 F (36.2 C) (10/10 1615) Pulse Rate:  [68-72] 68  (10/10 1615) Resp:  [16-18] 16  (10/10 1615) BP: (116-136)/(43-85) 116/43 mmHg (10/10 1615) SpO2:  [92 %-95 %] 92 % (10/10 1615)  Intake/Output from previous day:   Intake/Output this shift: Total I/O In: 2600 [I.V.:2600] Out: 1100 [Urine:700; Blood:400]  Physical exam the patient is alert and pleasant. He is moving all 4 extremities well. His dressing is clean and dry.  Lab Results: No results found for this basename: WBC:2,HGB:2,HCT:2,PLT:2 in the last 72 hours BMET No results found for this basename: NA:2,K:2,CL:2,CO2:2,GLUCOSE:2,BUN:2,CREATININE:2,CALCIUM:2 in the last 72 hours  Studies/Results: Dg Lumbar Spine 2-3 Views  03/11/2012  *RADIOLOGY REPORT*  Clinical Data: Posterior lumbar interbody fusion L2-L3  DG C-ARM 1-60 MIN,LUMBAR SPINE - 2-3 VIEW  Comparison: CT 08/01/2011  Findings: Two intraprocedural fluoroscopic images are provided showing apparent posterior pedicle fusion hardware at the L2 level is a new interbody spacer in place.  Previously seen hardware L3 inferiorly again noted.  No evidence for hardware failure or new compression deformity.  IMPRESSION: Expected intraoperative appearance after placement of a L2 pedicle fusion hardware as above.   Original Report Authenticated By: Harrel Lemon, M.D.    Dg C-arm 1-60 Min  03/11/2012  *RADIOLOGY REPORT*  Clinical Data: Posterior lumbar interbody fusion L2-L3  DG C-ARM 1-60 MIN,LUMBAR SPINE - 2-3 VIEW  Comparison: CT 08/01/2011  Findings: Two intraprocedural fluoroscopic images are provided showing apparent posterior pedicle fusion hardware at the L2 level is a new interbody spacer in place.  Previously seen hardware L3 inferiorly again noted.  No evidence for hardware failure or new compression  deformity.  IMPRESSION: Expected intraoperative appearance after placement of a L2 pedicle fusion hardware as above.   Original Report Authenticated By: Harrel Lemon, M.D.     Assessment/Plan: The patient is doing well.  LOS: 0 days     Veyda Kaufman D 03/11/2012, 4:35 PM

## 2012-03-12 ENCOUNTER — Encounter (HOSPITAL_COMMUNITY): Payer: Self-pay | Admitting: *Deleted

## 2012-03-12 LAB — CBC
HCT: 37.9 % — ABNORMAL LOW (ref 39.0–52.0)
Hemoglobin: 12.9 g/dL — ABNORMAL LOW (ref 13.0–17.0)
MCH: 30.1 pg (ref 26.0–34.0)
MCHC: 34 g/dL (ref 30.0–36.0)
MCV: 88.6 fL (ref 78.0–100.0)
Platelets: 174 10*3/uL (ref 150–400)
RBC: 4.28 MIL/uL (ref 4.22–5.81)
RDW: 13.2 % (ref 11.5–15.5)
WBC: 11.1 10*3/uL — ABNORMAL HIGH (ref 4.0–10.5)

## 2012-03-12 LAB — BASIC METABOLIC PANEL
BUN: 13 mg/dL (ref 6–23)
CO2: 25 mEq/L (ref 19–32)
Calcium: 9.1 mg/dL (ref 8.4–10.5)
Chloride: 105 mEq/L (ref 96–112)
Creatinine, Ser: 0.91 mg/dL (ref 0.50–1.35)
GFR calc Af Amer: >90 mL/min (ref >90)
GFR calc non Af Amer: >90 mL/min (ref >90)
Glucose, Bld: 117 mg/dL — ABNORMAL HIGH (ref 70–99)
Potassium: 4.3 mEq/L (ref 3.5–5.1)
Sodium: 140 mEq/L (ref 135–145)

## 2012-03-12 MED ORDER — INFLUENZA VIRUS VACC SPLIT PF IM SUSP
0.5000 mL | INTRAMUSCULAR | Status: AC
  Administered 2012-03-13: 0.5 mL via INTRAMUSCULAR
  Filled 2012-03-12: qty 0.5

## 2012-03-12 MED ORDER — MORPHINE SULFATE 2 MG/ML IJ SOLN: 1.0000 mg | INTRAMUSCULAR | Status: DC | PRN

## 2012-03-12 MED FILL — Hydromorphone HCl Inj 1 MG/ML: INTRAMUSCULAR | Qty: 1 | Status: AC

## 2012-03-12 NOTE — Evaluation (Addendum)
Physical Therapy Evaluation and Discharge Patient Details Name: [name] MRN: [id number] DOB: 1950/08/18 Today's Date: 03/12/2012 Time: 4098-1191 PT Time Calculation (min): 15 min  PT Assessment / Plan / Recommendation Clinical Impression  61 yo s/p L2-3 PLIF, L2 foraminotomy, and L2-S1 fixation with ability to recall back precautions from prior surgeries, however is impulsive with his movements and required cues to maintain back precautions and for safe use of DME. No further PT needs.    PT Assessment  Patent does not need any further PT services    Follow Up Recommendations  No PT follow up    Does the patient have the potential to tolerate intense rehabilitation      Barriers to Discharge        Equipment Recommendations  None recommended by PT    Recommendations for Other Services     Frequency      Precautions / Restrictions Precautions Precautions: Back Precaution Booklet Issued: Yes (comment) Required Braces or Orthoses: Spinal Brace Spinal Brace: Lumbar corset   Pertinent Vitals/Pain 5/10 incisional back pain; repositioned      Mobility  Bed Mobility Bed Mobility: Right Sidelying to Sit;Sitting - Scoot to Edge of Bed Right Sidelying to Sit: 6: Modified independent (Device/Increase time);HOB flat Sitting - Scoot to Edge of Bed: 7: Independent Transfers Transfers: Sit to Stand;Stand to Sit Sit to Stand: 5: Supervision;With upper extremity assist;From bed Stand to Sit: 5: Supervision;With upper extremity assist;With armrests;To chair/3-in-1 Details for Transfer Assistance: Pt impulsive and moves quickly; pushes down on RW to come to stand without tipping RW, however requires supervision for safety; on approaching chair to sit, pt "parked" RW off to the side Ambulation/Gait Ambulation/Gait Assistance: 5: Supervision;6: Modified independent (Device/Increase time) Ambulation Distance (Feet): 400 Feet Assistive device: Rolling walker Ambulation/Gait  Assistance Details: Walks quickly with upright posture; encouraged to slow down and not overdo activity; pt insisted on walking around the whole unit Gait Pattern: Within Functional Limits Stairs: No    Shoulder Instructions     Exercises     PT Diagnosis:    PT Problem List:   PT Treatment Interventions:     PT Goals    Visit Information  Last PT Received On: 03/12/12 Assistance Needed: +1    Subjective Data  Subjective: Pt reports this is his 9th back surgery Patient Stated Goal: get up and get moving, but not to overdo it   Prior Functioning  Home Living Lives With: Spouse Available Help at Discharge: Family;Available 24 hours/day Type of Home: House Home Access: Stairs to enter [employer] of Steps: 2 Entrance Stairs-Rails: None Home Layout: Two level;Able to live on main level with bedroom/bathroom Alternate Level Stairs-Number of Steps: cheir lift on steps Bathroom Shower/Tub: Health visitor: Handicapped height Bathroom Accessibility: Yes How Accessible: Accessible via walker Home Adaptive Equipment: Walker - rolling;Straight cane;Reacher Prior Function Level of Independence: Independent with assistive device(s) Able to Take Stairs?: Yes Driving: Yes Comments: uses cane at times PTA Communication Communication: No difficulties    Cognition  Overall Cognitive Status: Appears within functional limits for tasks assessed/performed Arousal/Alertness: Awake/alert Orientation Level: Appears intact for tasks assessed Behavior During Session: [facility] for tasks performed    Extremity/Trunk Assessment Right Lower Extremity Assessment RLE ROM/Strength/Tone: [facility] for tasks assessed Left Lower Extremity Assessment LLE ROM/Strength/Tone: [facility] for tasks assessed   Balance    End of Session PT - End of Session Equipment Utilized During Treatment: Back brace Activity Tolerance: Patient tolerated treatment well Patient left:  in chair;with call  bell/phone within reach;with family/visitor present Nurse Communication: Mobility status   Patient is being discharged from PT services secondary to:  Goals met and no further therapy needs identified.   Progress and discharge plan and discussed with patient/caregiver and they  Agree        Frimy Uffelman 03/12/2012, 11:22 AM  Pager [phone]

## 2012-03-12 NOTE — Clinical Social Work Note (Signed)
Clinical Social Work  CSW received consult for SNF. CSW reviewed chart and discussed pt during progression with RN. PT/OT are not recommending any follow up. RNCM is aware. CSW is signing off as no further needs identified. Please reconsult if a need arises prior to discharge.  Dede Query, MSW, Theresia Majors [phone]

## 2012-03-12 NOTE — Progress Notes (Signed)
   CARE MANAGEMENT NOTE 03/12/2012  Patient:  [name], [name]   Account Number:  [id number]  Date Initiated:  03/12/2012  Documentation initiated by:  [facility]  Subjective/Objective Assessment:   Bilateral L2 Laminotomy/foraminotomies to decompress the bilateral L2 and L3 nerve roots     Action/Plan:   No HH rec   Anticipated DC Date:  03/12/2012   Anticipated DC Plan:  HOME W HOME HEALTH SERVICES      DC Planning Services  CM consult      Choice offered to / List presented to:             Status of service:  Completed, signed off Medicare Important Message given?   (If response is "NO", the following Medicare IM given date fields will be blank) Date Medicare IM given:   Date Additional Medicare IM given:    Discharge Disposition:  HOME/SELF CARE  Per UR Regulation:    If discussed at Long Length of Stay Meetings, dates discussed:    Comments:  03/12/2012 1200 NCM spoke to pt and states no HH is needed. States he is able to ambulate. Has DME at home such as RW, lift chair, and wheelchair. No HH recommended. No other NCM needs identified at this time. Isidoro Donning RN CCM Case Mgmt phone [phone]

## 2012-03-12 NOTE — Evaluation (Signed)
Occupational Therapy Evaluation Patient Details Name: [name] MRN: [id number] DOB: 05/15/1951 Today's Date: 03/12/2012 Time: 5784-6962 OT Time Calculation (min): 16 min  OT Assessment / Plan / Recommendation Clinical Impression  Pt s/p L2-3 PLIF, L2 foraminotomy, and L2-S1 fixation.  Pt with history of multiple back surgeries and was able to recall and generalize all back precautions.  Education completed, pt has no DME needs.  No further acute OT needed.    OT Assessment  Patient does not need any further OT services    Follow Up Recommendations  No OT follow up;Supervision - Intermittent    Barriers to Discharge      Equipment Recommendations  None recommended by PT;None recommended by OT    Recommendations for Other Services    Frequency       Precautions / Restrictions Precautions Precautions: Back Precaution Booklet Issued: Yes (comment) Required Braces or Orthoses: Spinal Brace Spinal Brace: Lumbar corset   Pertinent Vitals/Pain See vitals    ADL  Lower Body Bathing: Simulated;Modified independent Where Assessed - Lower Body Bathing: Unsupported sit to stand Lower Body Dressing: Performed;Modified independent Where Assessed - Lower Body Dressing: Unsupported sit to stand Toilet Transfer: Simulated;Supervision/safety Toilet Transfer Method: Sit to Barista:  (bed to ambulate in hallway, then return to chair in room) Equipment Used: Back brace;Rolling walker Transfers/Ambulation Related to ADLs: Supervision with RW.  Supervision for safety due to slightly impulsive with mobility ADL Comments: Pt able to don shoes by crossing ankles over knees.  Pt donned back brace with supervision and VC x1 for proper positioning.    OT Diagnosis:    OT Problem List:   OT Treatment Interventions:     OT Goals    Visit Information  Last OT Received On: 03/12/12 Assistance Needed: +1    Subjective Data  Subjective: "I've been through this all  before" Patient Stated Goal: Return home   Prior Functioning     Home Living Lives With: Spouse Available Help at Discharge: Family;Available 24 hours/day Type of Home: House Home Access: Stairs to enter [employer] of Steps: 2 Entrance Stairs-Rails: None Home Layout: Two level;Able to live on main level with bedroom/bathroom Alternate Level Stairs-Number of Steps: cheir lift on steps Bathroom Shower/Tub: Health visitor: Handicapped height Bathroom Accessibility: Yes How Accessible: Accessible via walker Home Adaptive Equipment: Walker - rolling;Straight cane;Reacher Prior Function Level of Independence: Independent with assistive device(s) Able to Take Stairs?: Yes Driving: Yes Comments: uses cane at times PTA Communication Communication: No difficulties         Vision/Perception     Cognition  Overall Cognitive Status: Appears within functional limits for tasks assessed/performed Arousal/Alertness: Awake/alert Orientation Level: Appears intact for tasks assessed Behavior During Session: [facility] for tasks performed    Extremity/Trunk Assessment Right Upper Extremity Assessment RUE ROM/Strength/Tone: [facility] for tasks assessed Left Upper Extremity Assessment LUE ROM/Strength/Tone: [facility] for tasks assessed Right Lower Extremity Assessment RLE ROM/Strength/Tone: [facility] for tasks assessed Left Lower Extremity Assessment LLE ROM/Strength/Tone: [facility] for tasks assessed     Mobility Bed Mobility Bed Mobility: Right Sidelying to Sit;Sitting - Scoot to Edge of Bed Right Sidelying to Sit: 6: Modified independent (Device/Increase time);HOB flat Sitting - Scoot to Edge of Bed: 7: Independent Transfers Transfers: Sit to Stand;Stand to Sit Sit to Stand: 5: Supervision;With upper extremity assist;From bed Stand to Sit: 5: Supervision;With upper extremity assist;With armrests;To chair/3-in-1 Details for Transfer Assistance: Pt impulsive and moves quickly;  pushes down on RW to come  to stand without tipping RW, however requires supervision for safety; on approaching chair to sit, pt "parked" RW off to the side     Shoulder Instructions     Exercise     Balance     End of Session OT - End of Session Equipment Utilized During Treatment: Back brace Activity Tolerance: Patient tolerated treatment well Patient left: in chair;with call bell/phone within reach;with family/visitor present Nurse Communication: Mobility status  GO    03/12/2012 Cipriano Mile OTR/L Pager [phone] Office [phone]  Cipriano Mile 03/12/2012, 11:28 AM

## 2012-03-12 NOTE — Progress Notes (Signed)
Patient ID: ALESSANDER SIKORSKI, male   DOB: 05-25-1951, 61 y.o.   MRN: [id number] Subjective:  The patient is alert and pleasant. His back is appropriately sore. He looks well.  Objective: Vital signs in last 24 hours: Temp:  [97.1 F (36.2 C)-98 F (36.7 C)] 98 F (36.7 C) (10/11 0529) Pulse Rate:  [56-95] 75  (10/11 0529) Resp:  [10-19] 18  (10/11 0529) BP: (106-155)/(43-85) 106/57 mmHg (10/11 0529) SpO2:  [92 %-99 %] 95 % (10/11 0529) Weight:  [102.4 kg (225 lb 12 oz)] 102.4 kg (225 lb 12 oz) (10/11 0529)  Intake/Output from previous day: 10/10 0701 - 10/11 0700 In: 2600 [I.V.:2600] Out: 3250 [Urine:2850; Blood:400] Intake/Output this shift:    Physical exam the patient is alert and oriented. His strength is normal his lower extremities. His dressing is clean and dry.  Lab Results:  [facility] 03/12/12 0655  WBC 11.1*  HGB 12.9*  HCT 37.9*  PLT 174   BMET No results found for this basename: NA:2,K:2,CL:2,CO2:2,GLUCOSE:2,BUN:2,CREATININE:2,CALCIUM:2 in the last 72 hours  Studies/Results: Dg Lumbar Spine 2-3 Views  03/11/2012  *RADIOLOGY REPORT*  Clinical Data: Posterior lumbar interbody fusion L2-L3  DG C-ARM 1-60 MIN,LUMBAR SPINE - 2-3 VIEW  Comparison: CT 08/01/2011  Findings: Two intraprocedural fluoroscopic images are provided showing apparent posterior pedicle fusion hardware at the L2 level is a new interbody spacer in place.  Previously seen hardware L3 inferiorly again noted.  No evidence for hardware failure or new compression deformity.  IMPRESSION: Expected intraoperative appearance after placement of a L2 pedicle fusion hardware as above.   Original Report Authenticated By: Harrel Lemon, M.D.    Dg C-arm 1-60 Min  03/11/2012  *RADIOLOGY REPORT*  Clinical Data: Posterior lumbar interbody fusion L2-L3  DG C-ARM 1-60 MIN,LUMBAR SPINE - 2-3 VIEW  Comparison: CT 08/01/2011  Findings: Two intraprocedural fluoroscopic images are provided showing apparent posterior  pedicle fusion hardware at the L2 level is a new interbody spacer in place.  Previously seen hardware L3 inferiorly again noted.  No evidence for hardware failure or new compression deformity.  IMPRESSION: Expected intraoperative appearance after placement of a L2 pedicle fusion hardware as above.   Original Report Authenticated By: Harrel Lemon, M.D.     Assessment/Plan: Postop day 1: The patient is doing well. We will mobilize him with PT and OT. He will likely go home tomorrow.  LOS: 1 day     Kalanie Fewell D 03/12/2012, 7:43 AM

## 2012-03-13 MED ORDER — DIAZEPAM 10 MG PO TABS: 10.0000 mg | ORAL_TABLET | Freq: Every day | ORAL | Status: DC | PRN

## 2012-03-13 MED ORDER — PROMETHAZINE HCL 25 MG PO TABS: 25.0000 mg | ORAL_TABLET | Freq: Four times a day (QID) | ORAL | Status: DC | PRN

## 2012-03-13 MED ORDER — DIAZEPAM 5 MG PO TABS
5.0000 mg | ORAL_TABLET | Freq: Four times a day (QID) | ORAL | Status: DC | PRN
  Administered 2012-03-13 – 2012-03-15 (×7): 5 mg via ORAL
  Filled 2012-03-13 (×7): qty 1

## 2012-03-13 MED ORDER — OXYCODONE-ACETAMINOPHEN 10-325 MG PO TABS: 1.0000 | ORAL_TABLET | ORAL | Status: DC | PRN

## 2012-03-13 MED ORDER — DSS 100 MG PO CAPS: 100.0000 mg | ORAL_CAPSULE | Freq: Two times a day (BID) | ORAL | Status: DC

## 2012-03-13 MED ORDER — ROSUVASTATIN CALCIUM 10 MG PO TABS
10.0000 mg | ORAL_TABLET | Freq: Every day | ORAL | Status: DC
  Administered 2012-03-14: 10 mg via ORAL
  Filled 2012-03-13 (×2): qty 1

## 2012-03-13 NOTE — Progress Notes (Signed)
Patient ID: [name], male   DOB: [birth year], 61 y.o.   MRN: [id number] Subjective:  The patient is alert and pleasant. Is more sore today. He looks well.  Objective: Vital signs in last 24 hours: Temp:  [97.8 F (36.6 C)-99.8 F (37.7 C)] 97.8 F (36.6 C) (10/12 0649) Pulse Rate:  [58-81] 65  (10/12 0649) Resp:  [18-20] 18  (10/12 0649) BP: (94-119)/(55-62) 119/57 mmHg (10/12 0649) SpO2:  [92 %-98 %] 95 % (10/12 0649)  Intake/Output from previous day: 10/11 0701 - 10/12 0700 In: 960 [P.O.:960] Out: 552 [Urine:552] Intake/Output this shift: Total I/O In: 340 [P.O.:340] Out: -   Physical exam the patient is alert. His strength is normal his lower extremities. Dressing is clean and dry.  Lab Results:  [facility] 03/12/12 0655  WBC 11.1*  HGB 12.9*  HCT 37.9*  PLT 174   BMET  Basename 03/12/12 0655  NA 140  K 4.3  CL 105  CO2 25  GLUCOSE 117*  BUN 13  CREATININE 0.91  CALCIUM 9.1    Studies/Results: Dg Lumbar Spine 2-3 Views  03/11/2012  *RADIOLOGY REPORT*  Clinical Data: Posterior lumbar interbody fusion L2-L3  DG C-ARM 1-60 MIN,LUMBAR SPINE - 2-3 VIEW  Comparison: CT 08/01/2011  Findings: Two intraprocedural fluoroscopic images are provided showing apparent posterior pedicle fusion hardware at the L2 level is a new interbody spacer in place.  Previously seen hardware L3 inferiorly again noted.  No evidence for hardware failure or new compression deformity.  IMPRESSION: Expected intraoperative appearance after placement of a L2 pedicle fusion hardware as above.   Original Report Authenticated By: Harrel Lemon, M.D.    Dg C-arm 1-60 Min  03/11/2012  *RADIOLOGY REPORT*  Clinical Data: Posterior lumbar interbody fusion L2-L3  DG C-ARM 1-60 MIN,LUMBAR SPINE - 2-3 VIEW  Comparison: CT 08/01/2011  Findings: Two intraprocedural fluoroscopic images are provided showing apparent posterior pedicle fusion hardware at the L2 level is a new interbody spacer in place.   Previously seen hardware L3 inferiorly again noted.  No evidence for hardware failure or new compression deformity.  IMPRESSION: Expected intraoperative appearance after placement of a L2 pedicle fusion hardware as above.   Original Report Authenticated By: Harrel Lemon, M.D.     Assessment/Plan: Postop day #2: The patient is doing well. He will likely go home tomorrow.  LOS: 2 days     Ahmet Schank D 03/13/2012, 8:59 AM

## 2012-03-14 NOTE — Progress Notes (Signed)
Subjective: Patient reports nauseated with vomiting this am  Objective: Vital signs in last 24 hours: Temp:  [97.8 F (36.6 C)-99.4 F (37.4 C)] 97.9 F (36.6 C) (10/13 0600) Pulse Rate:  [64-77] 64  (10/13 0600) Resp:  [20] 20  (10/13 0600) BP: (109-125)/(56-76) 125/56 mmHg (10/13 0600) SpO2:  [94 %-98 %] 98 % (10/13 0600)  Intake/Output from previous day: 10/12 0701 - 10/13 0700 In: 700 [P.O.:700] Out: -  Intake/Output this shift:    Physical Exam: Dressing CDI.  No leg complaints.  Lab Results:  [facility] 03/12/12 0655  WBC 11.1*  HGB 12.9*  HCT 37.9*  PLT 174   BMET  Basename 03/12/12 0655  NA 140  K 4.3  CL 105  CO2 25  GLUCOSE 117*  BUN 13  CREATININE 0.91  CALCIUM 9.1    Studies/Results: No results found.  Assessment/Plan: Not feeling as good today as yesterday.  Will work on mobility and analgesia today.  If doing well, anticipate D/C in am.    LOS: 3 days    Dorian Heckle, MD 03/14/2012, 8:46 AM

## 2012-03-15 LAB — TYPE AND SCREEN
ABO/RH(D): A POS
Antibody Screen: POSITIVE
Unit division: 0
Unit division: 0

## 2012-03-15 MED FILL — Sodium Chloride IV Soln 0.9%: INTRAVENOUS | Qty: 1000 | Status: AC

## 2012-03-15 MED FILL — Heparin Sodium (Porcine) Inj 1000 Unit/ML: INTRAMUSCULAR | Qty: 30 | Status: AC

## 2012-03-15 NOTE — Discharge Summary (Signed)
Physician Discharge Summary  Patient ID: [name] MRN: [id number] DOB/AGE: [birth year] 61 y.o.  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  Discharge Diagnoses: The same Active Problems:  * No active hospital problems. *    Discharged Condition: good  Hospital Course: I admitted the patient to [facility] on 03/11/2012. On that day I performed an exploration of his lumbar fusion with an L2-3 decompression, instrumentation, and fusion. The surgery were well.  The patient's postoperative course was only remarkable for some nausea and vomiting. This has resolved. Patient has requested discharge to home. He was given oral and written discharge instructions. All his questions were answered.  Consults: None Significant Diagnostic Studies: None Treatments: Exploration of lumbar fusion, L2-3 decompression, instrumentation, and fusion. Discharge Exam: Blood pressure 104/47, pulse 68, temperature 97.6 F (36.4 C), temperature source Oral, resp. rate 18, height 6' (1.829 m), weight 102.4 kg (225 lb 12 oz), SpO2 96.00%. The patient is alert and oriented. He looks well. His incision is healing well without signs of infection. His lower extremity strength is normal.  Disposition: Home  Discharge Orders    Future Orders Please Complete By Expires   Diet - low sodium heart healthy      Increase activity slowly      Discharge instructions      Comments:   Call [phone] for a followup appointment.   No dressing needed      Call MD for:  temperature >100.4      Call MD for:  persistant nausea and vomiting      Call MD for:  severe uncontrolled pain      Call MD for:  redness, tenderness, or signs of infection (pain, swelling, redness, odor or green/yellow discharge around incision site)      Call MD for:  difficulty breathing, headache or visual  disturbances      Call MD for:  hives      Call MD for:  persistant dizziness or light-headedness      Call MD for:  extreme fatigue          Medication List     As of 03/15/2012 10:10 AM    STOP taking these medications         tapentadol 50 MG Tabs   Commonly known as: NUCYNTA      TAKE these medications         calcium-vitamin D 500-200 MG-UNIT per tablet   Commonly known as: OSCAL WITH D   Take 1 tablet by mouth every evening.      DEXILANT 60 MG capsule   Generic drug: dexlansoprazole   Take 60 mg by mouth daily.      diazepam 10 MG tablet   Commonly known as: VALIUM   Take 10 mg by mouth daily as needed. Muscle spasms      diazepam 10 MG tablet   Commonly known as: VALIUM   Take 1 tablet (10 mg total) by mouth daily as needed.      DSS 100 MG Caps   Take 100 mg by mouth 2 (two) times daily.      fexofenadine 180 MG tablet   Commonly known as: ALLEGRA   Take 180 mg by mouth every evening.      lisinopril 10 MG tablet   Commonly known as: PRINIVIL,ZESTRIL   Take 10 mg by mouth daily.      montelukast 10  MG tablet   Commonly known as: SINGULAIR   Take 10 mg by mouth daily.      multivitamin with minerals Tabs   Take 1 tablet by mouth daily.      omega-3 acid ethyl esters 1 G capsule   Commonly known as: LOVAZA   Take 1 g by mouth 2 (two) times daily.      oxyCODONE-acetaminophen 10-325 MG per tablet   Commonly known as: PERCOCET   Take 0.5-1 tablets by mouth every 5 (five) hours as needed. For pain      oxyCODONE-acetaminophen 10-325 MG per tablet   Commonly known as: PERCOCET   Take 1 tablet by mouth every 4 (four) hours as needed for pain.      promethazine 25 MG tablet   Commonly known as: PHENERGAN   Take 25-50 mg by mouth every 6 (six) hours as needed. tabs for nausea      promethazine 25 MG tablet   Commonly known as: PHENERGAN   Take 1-2 tablets (25-50 mg total) by mouth every 6 (six) hours as needed for nausea.      rosuvastatin 10  MG tablet   Commonly known as: CRESTOR   Take 10 mg by mouth daily.         SignedTressie Stalker D 03/15/2012, 10:10 AM

## 2012-03-25 ENCOUNTER — Ambulatory Visit (HOSPITAL_COMMUNITY)
Admission: RE | Admit: 2012-03-25 | Discharge: 2012-03-25 | Disposition: A | Discharge status: Home / Self Care | Payer: Medicare Other | Source: Ambulatory Visit | Attending: Neurosurgery | Admitting: Neurosurgery

## 2012-03-25 DIAGNOSIS — M545 Low back pain, unspecified: Secondary | ICD-10-CM | POA: Insufficient documentation

## 2012-03-25 DIAGNOSIS — M6281 Muscle weakness (generalized): Secondary | ICD-10-CM | POA: Insufficient documentation

## 2012-03-25 DIAGNOSIS — IMO0001 Reserved for inherently not codable concepts without codable children: Secondary | ICD-10-CM | POA: Insufficient documentation

## 2012-03-25 DIAGNOSIS — M549 Dorsalgia, unspecified: Secondary | ICD-10-CM | POA: Insufficient documentation

## 2012-03-25 DIAGNOSIS — Z981 Arthrodesis status: Secondary | ICD-10-CM | POA: Insufficient documentation
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
## 2012-03-30 ENCOUNTER — Ambulatory Visit (HOSPITAL_COMMUNITY)
Admission: RE | Admit: 2012-03-30 | Discharge: 2012-03-30 | Disposition: A | Discharge status: Home / Self Care | Payer: Medicare Other | Source: Ambulatory Visit | Attending: Neurosurgery | Admitting: Neurosurgery

## 2012-03-30 NOTE — Progress Notes (Signed)
Physical Therapy Treatment Patient Details  Name: [name] MRN: [id number] Date of Birth: [birth year]  Today's Date: 03/30/2012 Time: 6295-2841 PT Time Calculation (min): 38 min Charges: 14' TE Visit#: 2  of 8   Re-eval: 04/24/12    Authorization: [facility] MEDICARE  Authorization Time Period:    Authorization Visit#: 2  of 10    Subjective: Symptoms/Limitations Pertinent History: Pt reports that he is sore today because he was doing to much with working on his cars at home.  He reports that overall he has been really using his core muscles to move which has helped a lot.  Pain Assessment Currently in Pain?: Yes Pain Score:   6 Pain Location: Back Pain Orientation: Right;Left;Lower  Precautions/Restrictions     Exercise/Treatments Stretches Active Hamstring Stretch: 3 reps;30 seconds (BLE) Lower Trunk Rotation: Limitations Lower Trunk Rotation Limitations: 10x w/TrA contraction Standing Heel Raises: 15 reps;Limitations Heel Raises Limitations: Toe Raises x15 Functional Squats: 10 reps (w/manual facilitaion) Theraband Level (Scapular Retraction): Level 3 (Green) Row: Both;15 reps;Theraband Theraband Level (Row): Level 3 (Green) Shoulder Extension: Both;15 reps;Theraband Theraband Level (Shoulder Extension): Level 3 (Green) Shoulder ADduction: Both;15 reps;Theraband (BUE) Theraband Level (Shoulder Adduction): Level 3 (Green) Seated Other Seated Lumbar Exercises: Heel and Toe Roll in and outs 10x10 sec holds w/cueing for posture Other Seated Lumbar Exercises: Multifidus 10x10 sec holds Supine Ab Set: 5 reps AB Set Limitations: Manual faciliation to activaite TrA musculature w/moderate compensations 10 sec holds Clam: 10 reps (Supine, BLE alternating) Bent Knee Raise: 10 reps;Other (comment) (BLE, alternating) Bridge: 20 reps Straight Leg Raise: 10 reps (BLE w/TrA contraction) Isometric Hip Flexion: 10 reps;Limitations Isometric Hip Flexion Limitations: 5 sec holds  BLE Sidelying Clam: 10 reps;Limitations Clam Limitations: 10 sec holds w/manual faciltation to gluteus medius Hip Abduction: 15 reps Hip Abduction Limitations: BLE  Physical Therapy Assessment and Plan PT Assessment and Plan Clinical Impression Statement: Pt has improved core coordination after manual facilitaion.  He is able to tolerate 8 minutes of standing activities.  Has most pain with bridging activities, however he reports that he wants to continue to push to improve his overall strength.  Clinical Impairments Affecting Rehab Potential: secondary to mutliple medical problems.  PT Plan: NO PRONE ACTIVITIES, no e-stim.  Continue with core coordination and strengthening activities to improve multifidus, TrA and PF strength and endurance.  Progress to supine marching, tall kneeling and progress to half kneeling    Goals    Problem List Patient Active Problem List  Diagnosis  . S/P lumbar fusion  . Back pain without radiation    PT Plan of Care PT Home Exercise Plan: see scanned report (core strength) PT Patient Instructions: Discussed importance of posture and HEP.   Consulted and Agree with Plan of Care: Patient  GP Functional Assessment Tool Used: ODI  [name] 03/30/2012, 3:54 PM

## 2012-04-01 ENCOUNTER — Ambulatory Visit (HOSPITAL_COMMUNITY)
Admission: RE | Admit: 2012-04-01 | Discharge: 2012-04-01 | Disposition: A | Discharge status: Home / Self Care | Payer: Medicare Other | Source: Ambulatory Visit | Attending: Family Medicine | Admitting: Family Medicine

## 2012-04-01 NOTE — Progress Notes (Signed)
Physical Therapy Treatment Patient Details  Name: [name] MRN: [id number] Date of Birth: 1951/02/03  Today's Date: 04/01/2012 Time: 4098-1191 PT Time Calculation (min): 43 min Charges: 67' TE Visit#: 3  of 8   Re-eval: 04/24/12 Assessment Diagnosis: L2-3 lumbar fusion, back pain Surgical Date: 03/11/12 Next MD Visit: Dr. Lovell Sheehan - Novemeber 1st  Authorization: [facility] MEDICARE  Authorization Time Period:    Authorization Visit#: 3  of 10    Subjective: Symptoms/Limitations Symptoms: Pt reports that he was sore after the workout initally, but after a few hours he started to feel really good.   Pain Assessment Currently in Pain?: Yes Pain Score:   5  Precautions/Restrictions  Precautions Precautions: Back Precaution Comments: No lifting over 20lbs. NO PRONE ACTIVITIES Required Braces or Orthoses: Spinal Brace  Exercise/Treatments Stretches Active Hamstring Stretch: 3 reps;30 seconds (BLE) Standing Heel Raises: 20 reps Heel Raises Limitations: Toe Raises x20+ Functional Squats: 15 reps Scapular Retraction: Both;10 reps;Theraband Theraband Level (Scapular Retraction): Level 3 (Green) Row: Both;15 reps;Theraband Theraband Level (Row): Level 3 (Green) Shoulder Extension: Both;15 reps;Theraband Theraband Level (Shoulder Extension): Level 3 (Green) Shoulder ADduction: Both;15 reps;Theraband Theraband Level (Shoulder Adduction): Level 3 (Green) Other Standing Lumbar Exercises: Rocker Board S<>S 2' Other Standing Lumbar Exercises: Stairs 4 in: BLE: Forward x10; Lateral 10x Supine Clam: 20 reps (BLE alternating) Bent Knee Raise: 20 reps (BLE alternating) Bridge: 20 reps Straight Leg Raise: 20 reps (BLE) Isometric Hip Flexion: 10 reps;5 seconds (BLE) Sidelying Clam: 10 reps Clam Limitations: 10 sec holds w/manual faciltation to gluteus medius Hip Abduction: 15 reps (BLE)  Physical Therapy Assessment and Plan PT Assessment and Plan Clinical Impression Statement:  Progressed standing activities and added reps to improve functional strength and activity tolerance.  Pt able to complete activities with appropriate core activitiation.  Clinical Impairments Affecting Rehab Potential: secondary to mutliple medical problems.  PT Plan: Progress core activities: dead bug, half kneeling w/UE movement, balance activites    Goals Home Exercise Program Pt will Perform Home Exercise Program: Independently PT Goal: Perform Home Exercise Program - Progress: Progressing toward goal PT Short Term Goals Time to Complete Short Term Goals: 2 weeks PT Short Term Goal 1: Pt will report pain less than 5/10 for 50% of his day.  PT Short Term Goal 1 - Progress: Progressing toward goal PT Short Term Goal 2: Pt will improve his core coordination and be able to activate TrA and PF musculature independently and use min cueing for multifiuds activation.  PT Short Term Goal 2 - Progress: Progressing toward goal PT Long Term Goals Time to Complete Long Term Goals: 4 weeks PT Long Term Goal 1: Pt will improve his ODI to less than 60% for improved QOL. PT Long Term Goal 1 - Progress: Progressing toward goal PT Long Term Goal 2: Pt will report pain less than 4/10 fopr 75% of his day for improved QOL. PT Long Term Goal 2 - Progress: Progressing toward goal Long Term Goal 3: Pt will improve his core strength in order to present with normalized posture for an entire therapy regime with min cueing.  Long Term Goal 3 Progress: Progressing toward goal  Problem List Patient Active Problem List  Diagnosis  . S/P lumbar fusion  . Back pain without radiation       GP Functional Assessment Tool Used: ODI  Lovell Roe 04/01/2012, 1:44 PM

## 2012-04-06 ENCOUNTER — Ambulatory Visit (HOSPITAL_COMMUNITY)
Admission: RE | Admit: 2012-04-06 | Discharge: 2012-04-06 | Disposition: A | Discharge status: Home / Self Care | Payer: Medicare Other | Source: Ambulatory Visit | Attending: Neurosurgery | Admitting: Neurosurgery

## 2012-04-06 DIAGNOSIS — M545 Low back pain, unspecified: Secondary | ICD-10-CM | POA: Insufficient documentation

## 2012-04-06 DIAGNOSIS — M6281 Muscle weakness (generalized): Secondary | ICD-10-CM | POA: Insufficient documentation

## 2012-04-06 DIAGNOSIS — IMO0001 Reserved for inherently not codable concepts without codable children: Secondary | ICD-10-CM | POA: Insufficient documentation

## 2012-04-06 NOTE — Progress Notes (Signed)
Physical Therapy Treatment Patient Details  Name: [name] MRN: [id number] Date of Birth: Mar 11, 1951  Today's Date: 04/06/2012 Time: 4098-1191 PT Time Calculation (min): 39 min Charges: 64' TE Visit#: 4  of 8   Re-eval: 04/24/12    Authorization: [employer] MEDICARE  Authorization Time Period:    Authorization Visit#: 4  of 10    Subjective: Symptoms/Limitations Symptoms: pt reports that he is doing pretty well and is doing his exercises. he reports that his worst pain is when he gets up in the morning.  He reports that when he gets up in the morning he has the most pain, but then it eases when she stops.  Pain Assessment Currently in Pain?: Yes Pain Score:   6 Pain Location: Back  Precautions/Restrictions     Exercise/Treatments Stretches Active Hamstring Stretch: 3 reps;30 seconds;Limitations (w/manual overpressure) Active Hamstring Stretch Limitations: Standing active HS stretch w/5 pulses and 30 sec hold Standing Heel Raises: 20 reps (no HHA) Heel Raises Limitations: Toe Raises x20, no HHA Functional Squats: 20 reps Scapular Retraction: Both;20 reps Theraband Level (Scapular Retraction): Level 3 (Green) Row: Both;20 reps Theraband Level (Row): Level 3 (Green) Shoulder Extension: Both;20 reps;Theraband Theraband Level (Shoulder Extension): Level 3 (Green) Shoulder ADduction: Both;20 reps;Theraband Theraband Level (Shoulder Adduction): Level 3 (Green) Other Standing Lumbar Exercises: Stairs 4 in: BLE: Forward x20; Lateral 20x Seated Other Seated Lumbar Exercises: half kneeling with UE flexion w/purple band 5x10 sec holds Supine Other Supine Lumbar Exercises: Green ball roll ups x15, bridges 10x5 sec holds Sidelying Clam: 10 reps Clam Limitations: 10 sec holds     Physical Therapy Assessment and Plan PT Assessment and Plan Clinical Impression Statement: Pt continues to improve his overall LE strength and core endurance.  Able to demonstrate compliance with his  HEP.  Has greatest difficulty with maintaining appropriate posture.  Clinical Impairments Affecting Rehab Potential: secondary to mutliple medical problems.  PT Plan: Progress core activities: dead bug, half kneeling w/UE movement, balance activites    Goals    Problem List Patient Active Problem List  Diagnosis  . S/P lumbar fusion  . Back pain without radiation       GP Functional Assessment Tool Used: ODI  [name] 04/06/2012, 1:44 PM

## 2012-04-08 ENCOUNTER — Ambulatory Visit (HOSPITAL_COMMUNITY)
Admission: RE | Admit: 2012-04-08 | Discharge: 2012-04-08 | Disposition: A | Discharge status: Home / Self Care | Payer: Medicare Other | Source: Ambulatory Visit | Attending: Neurosurgery | Admitting: Neurosurgery

## 2012-04-08 NOTE — Progress Notes (Signed)
Physical Therapy Treatment Patient Details  Name: [name] MRN: [id number] Date of Birth: Dec 31, 1950  Today's Date: 04/08/2012 Time: 2130-8657 PT Time Calculation (min): 38 min Charges: 97' TE Visit#: 5  of 8   Re-eval: 04/24/12    Authorization: [employer] MEDICARE  Authorization Time Period:    Authorization Visit#: 5  of 10    Subjective: Symptoms/Limitations Symptoms: Pt reports that he was really sore after he left last visit, but a few hours later he felt great.   Pain Assessment Currently in Pain?: Yes Pain Score:   5 Pain Location: Back Pain Orientation: Right;Left;Lower  Precautions/Restrictions     Exercise/Treatments Stretches Active Hamstring Stretch: 3 reps;30 seconds;Limitations Active Hamstring Stretch Limitations: supine; Slant Board Stretch 3x30 sec Aerobic UBE (Upper Arm Bike): 6' @ 1.0 backwards Machines for Strengthening Cybex Knee Extension: 2 PL x20 Cybex Knee Flexion: 3 PL x20 Standing Scapular Retraction: Both;20 reps Theraband Level (Scapular Retraction): Level 4 (Blue) Row: Both;20 reps Theraband Level (Row): Level 4 (Blue) Shoulder Extension: Both;20 reps;Theraband Theraband Level (Shoulder Extension): Level 4 (Blue) Shoulder ADduction: Both;20 reps;Theraband Theraband Level (Shoulder Adduction): Level 4 (Blue) Other Standing Lumbar Exercises: Balance Beam 2 RT, SLS 3x10 sec BLE; Side stepping with squats  Other Standing Lumbar Exercises: Stairs 4 in: BLE: Forward x20; Lateral 20x, hip hikes x10; Stool Scoots 1 RT on carpet Supine Other Supine Lumbar Exercises: Green ball roll ups x15, bridges 20x, pelvic raise x5  Physical Therapy Assessment and Plan PT Assessment and Plan Clinical Impression Statement: Pt able to demonstrate greater ease with transitional movements.  Pt continues to show motivation to improve strength and stability with less cueing.   Clinical Impairments Affecting Rehab Potential: secondary to mutliple medical  problems.  PT Plan: Progress core activities: dead bug, half kneeling w/UE movement, balance activites    Goals    Problem List Patient Active Problem List  Diagnosis  . S/P lumbar fusion  . Back pain without radiation   GP Functional Assessment Tool Used: ODI  Ermias Tomeo, PT 04/08/2012, 1:48 PM

## 2012-04-13 ENCOUNTER — Ambulatory Visit (HOSPITAL_COMMUNITY): Payer: Medicare Other | Admitting: Physical Therapy

## 2012-04-15 ENCOUNTER — Ambulatory Visit (HOSPITAL_COMMUNITY)
Admission: RE | Admit: 2012-04-15 | Discharge: 2012-04-15 | Disposition: A | Discharge status: Home / Self Care | Payer: Medicare Other | Source: Ambulatory Visit | Attending: Neurosurgery | Admitting: Neurosurgery

## 2012-04-15 NOTE — Progress Notes (Signed)
Physical Therapy Treatment Patient Details  Name: [name] MRN: [id number] Date of Birth: [birth year]  Today's Date: 04/15/2012 Time: 1191-4782 PT Time Calculation (min): 38 min Charges: 61' TE Visit#: 6  of 8   Re-eval: 04/24/12    Authorization: [employer] MEDICARE  Authorization Time Period:    Authorization Visit#: 6  of 10    Subjective: Symptoms/Limitations Symptoms: Pt reports that his back is doing better than ever.  His pain is about a 2/10.  His neck and shoulder blades remain about a 5-6/10.  Pain Assessment Currently in Pain?: Yes Pain Score:   2 Pain Location: Back  Precautions/Restrictions     Exercise/Treatments Stretches Active Hamstring Stretch Limitations: Slant board 3x30 sec Aerobic UBE (Upper Arm Bike): 8' @ 2.0 for activity tolerance Machines for Strengthening Cybex Knee Extension: 5 PL x15 Cybex Knee Flexion: 5.5 PL x15 Standing Scapular Retraction: Both;20 reps;Theraband Theraband Level (Scapular Retraction): Level 4 (Blue) Row: Both;20 reps;Theraband Theraband Level (Row): Level 4 (Blue) Shoulder Extension: Both;20 reps;Theraband Theraband Level (Shoulder Extension): Level 4 (Blue) Shoulder ADduction: Both;20 reps;Theraband Theraband Level (Shoulder Adduction): Level 4 (Blue) Other Standing Lumbar Exercises: Corner Presses 10x10 sec holds Other Standing Lumbar Exercises: Stairs 6 in: BLE: Forward x20; Lateral 10x, step downs x15, hip hikes x10; Stool Scoots 1 RT on carpet Quadruped Single Arm Raise: Right;Left;10 reps Straight Leg Raise: 5 reps (BLE) Opposite Arm/Leg Raise: Right arm/Left leg;Left arm/Right leg;5 reps Other Quadruped Lumbar Exercises: B LE hip ER x5 each    Physical Therapy Assessment and Plan PT Assessment and Plan Clinical Impression Statement: added quadruped activities to improve strength functional strength.  Pt continues to improve overall strength and functional activity tolerance and did not require any rest breaks  with activities today. Less cueing required for core activation and posture.  PT Plan: Progress core activities: dead bug, half kneeling w/UE movement, balance activites    Goals Home Exercise Program Pt will Perform Home Exercise Program: Independently PT Goal: Perform Home Exercise Program - Progress: Met PT Short Term Goals Time to Complete Short Term Goals: 2 weeks PT Short Term Goal 1: Pt will report pain less than 5/10 for 50% of his day.  PT Short Term Goal 1 - Progress: Met (on average less than 5/10.) PT Short Term Goal 2: Pt will improve his core coordination and be able to activate TrA and PF musculature independently and use min cueing for multifiuds activation.  PT Short Term Goal 2 - Progress: Met PT Long Term Goals Time to Complete Long Term Goals: 4 weeks PT Long Term Goal 1: Pt will improve his ODI to less than 60% for improved QOL. PT Long Term Goal 1 - Progress: Progressing toward goal PT Long Term Goal 2: Pt will report pain less than 4/10 fopr 75% of his day for improved QOL. PT Long Term Goal 2 - Progress: Progressing toward goal Long Term Goal 3: Pt will improve his core strength in order to present with normalized posture for an entire therapy regime with min cueing.  Long Term Goal 3 Progress: Progressing toward goal  Problem List Patient Active Problem List  Diagnosis  . S/P lumbar fusion  . Back pain without radiation       GP Functional Assessment Tool Used: ODI  [name] 04/15/2012, 2:30 PM

## 2012-04-21 ENCOUNTER — Ambulatory Visit (HOSPITAL_COMMUNITY)
Admission: RE | Admit: 2012-04-21 | Discharge: 2012-04-21 | Disposition: A | Discharge status: Home / Self Care | Payer: Medicare Other | Source: Ambulatory Visit | Attending: Neurosurgery | Admitting: Neurosurgery

## 2012-04-21 NOTE — Evaluation (Signed)
Physical Therapy Discharge Note and treatment/G-code   Patient Details  Name: [name] MRN: [id number] Date of Birth: 06-28-1950  Today's Date: 04/21/2012 Time: 4098-1191 PT Time Calculation (min): 43 min Charges: 10' Self Care, 1' TE Visit#: 7  of 8   Re-eval: 04/24/12 Assessment Diagnosis: L2-3 lumbar fusion, back pain Surgical Date: 03/11/12 Next MD Visit: Dr. Lovell Sheehan  Authorization: [facility] MEDICARE  Authorization Time Period:    Authorization Visit#: 7  of 10    Subjective Symptoms/Limitations Symptoms: Pt reports that he has a lot of family coming in this week and would like this to be his last day.  he reports that his pain avg is about a 5/10, and is getting better to his low back everyday.  he plans on joining [employer] to continue with his strengthening activities.  Pain Assessment Currently in Pain?: Yes Pain Score:   2 Pain Location: Back  Precautions/Restrictions  Precautions Precautions: Back Precaution Comments: No lifting over 20lbs. NO PRONE ACTIVITIES  Sensation/Coordination/Flexibility/Functional Tests Functional Tests Functional Tests: ODI: 52% (was 80%)  Assessment RLE Strength RLE Overall Strength Comments: Hip and knee strength WNL Lumbar Strength Overall Lumbar Strength Comments: TrA, multifidus and PF contraction x8 sec each independent activation  Mobility/Balance  Ambulation/Gait Ambulation/Gait Assistance: 7: Independent Gait Pattern: Within Functional Limits Posture/Postural Control Postural Limitations: mild rounded shoulders   Exercise/Treatments Aerobic UBE (Upper Arm Bike): 8' @ 2.0 for activity tolerance Machines for Strengthening Cybex Knee Extension: 5 PL x15 Cybex Knee Flexion: 5.5 PL x15 Standing Other Standing Lumbar Exercises: Tandem and Retro gait, heel and toe walking 2 RT each Other Standing Lumbar Exercises: Stairs 6 in: BLE: Forward x20; Lateral 10x, step downs x15, hip hikes x10 Seated Other Seated Lumbar  Exercises: On green ball: shoulder flexion, ant/post tilt, pelvic s<>s x2 min each Supine Other Supine Lumbar Exercises: Green ball roll ups x20, bridges 20x, pelvic raise x10 Quadruped Opposite Arm/Leg Raise: Right arm/Left leg;Left arm/Right leg;10 reps;Limitations Opposite Arm/Leg Raise Limitations: on green ball    Physical Therapy Assessment and Plan PT Assessment and Plan Clinical Impression Statement: [name] has attended 7 OP PT visits to address L4-5 lumbar fusion with following findings: met 4/4 STG, 3/4 LTG continues to have greatest limitation by pain to his scapular region which is relieved with UE activities as well as difficulty with sleeping.  Pt plans to join [employer] to continue with LE strength and core stabilization.  He has improved his overall core strength and his percieved functional ability and is able to attend social and family functions with less pain. At this time pt wishes to be D/C from PT and continue on his own.  PT Plan: D/C per pt request    Goals Pt will Perform Home Exercise Program: Independently: Met PT Short Term Goals: 2 weeks PT Short Term Goal 1: Pt will report pain less than 5/10 for 50% of his day.: Met PT Short Term Goal 2: Pt will improve his core coordination and be able to activate TrA and PF musculature independently and use min cueing for multifiuds activation.: Met PT Long Term Goals: 4 weeks2 PT Long Term Goal 1: Pt will improve his ODI to less than 60% for improved QOL.: Met (52%) PT Long Term Goal 2: Pt will report pain less than 4/10 fopr 75% of his day for improved QOL.: Progressing toward goal (5/10) Long Term Goal 3: Pt will improve his core strength in order to present with normalized posture for an entire therapy regime with  min cueing. : Met  Problem List Patient Active Problem List  Diagnosis  . S/P lumbar fusion  . Back pain without radiation   PT Plan of Care PT Patient Instructions: Discussed ODI findings Consulted and  Agree with Plan of Care: Patient  GP Functional Assessment Tool Used: ODI Functional Limitation: Self care Self Care Goal Status (U9811): At least 60 percent but less than 80 percent impaired, limited or restricted Self Care Discharge Status [phone]): At least 40 percent but less than 60 percent impaired, limited or restricted  Leno Mathes, PT 04/21/2012, 3:03 PM  Physician Documentation Your signature is required to indicate approval of the treatment plan as stated above.  Please sign and either send electronically or make a copy of this report for your files and return this physician signed original.   Please mark one 1.__approve of plan  2. ___approve of plan with the following conditions.   ______________________________                                                          _____________________ Physician Signature                                                                                                             Date

## 2012-04-23 ENCOUNTER — Ambulatory Visit (HOSPITAL_COMMUNITY): Payer: Medicare Other | Admitting: Physical Therapy

## 2012-04-26 ENCOUNTER — Ambulatory Visit (HOSPITAL_COMMUNITY): Payer: Medicare Other | Admitting: Physical Therapy

## 2012-04-28 ENCOUNTER — Ambulatory Visit (HOSPITAL_COMMUNITY): Payer: Medicare Other | Admitting: Physical Therapy

## 2012-07-16 ENCOUNTER — Emergency Department (HOSPITAL_COMMUNITY): Payer: Medicare Other

## 2012-07-16 ENCOUNTER — Encounter (HOSPITAL_COMMUNITY): Payer: Self-pay | Admitting: *Deleted

## 2012-07-16 ENCOUNTER — Emergency Department (HOSPITAL_COMMUNITY)
Admission: EM | Admit: 2012-07-16 | Discharge: 2012-07-16 | Disposition: A | Discharge status: Home / Self Care | Payer: Medicare Other | Attending: Emergency Medicine | Admitting: Emergency Medicine

## 2012-07-16 DIAGNOSIS — Z79899 Other long term (current) drug therapy: Secondary | ICD-10-CM | POA: Insufficient documentation

## 2012-07-16 DIAGNOSIS — I10 Essential (primary) hypertension: Secondary | ICD-10-CM | POA: Insufficient documentation

## 2012-07-16 DIAGNOSIS — R109 Unspecified abdominal pain: Secondary | ICD-10-CM

## 2012-07-16 DIAGNOSIS — Z8619 Personal history of other infectious and parasitic diseases: Secondary | ICD-10-CM | POA: Insufficient documentation

## 2012-07-16 DIAGNOSIS — R110 Nausea: Secondary | ICD-10-CM | POA: Insufficient documentation

## 2012-07-16 DIAGNOSIS — R319 Hematuria, unspecified: Secondary | ICD-10-CM | POA: Insufficient documentation

## 2012-07-16 DIAGNOSIS — Z85828 Personal history of other malignant neoplasm of skin: Secondary | ICD-10-CM | POA: Insufficient documentation

## 2012-07-16 DIAGNOSIS — Z8739 Personal history of other diseases of the musculoskeletal system and connective tissue: Secondary | ICD-10-CM | POA: Insufficient documentation

## 2012-07-16 DIAGNOSIS — K219 Gastro-esophageal reflux disease without esophagitis: Secondary | ICD-10-CM | POA: Insufficient documentation

## 2012-07-16 DIAGNOSIS — Z8719 Personal history of other diseases of the digestive system: Secondary | ICD-10-CM | POA: Insufficient documentation

## 2012-07-16 DIAGNOSIS — Z87891 Personal history of nicotine dependence: Secondary | ICD-10-CM | POA: Insufficient documentation

## 2012-07-16 LAB — BASIC METABOLIC PANEL WITH GFR
BUN: 28 mg/dL — ABNORMAL HIGH (ref 6–23)
CO2: 25 meq/L (ref 19–32)
Calcium: 9.7 mg/dL (ref 8.4–10.5)
Chloride: 104 meq/L (ref 96–112)
Creatinine, Ser: 0.96 mg/dL (ref 0.50–1.35)
GFR calc Af Amer: >90 mL/min
GFR calc non Af Amer: 88 mL/min — ABNORMAL LOW
Glucose, Bld: 84 mg/dL (ref 70–99)
Potassium: 4.3 meq/L (ref 3.5–5.1)
Sodium: 140 meq/L (ref 135–145)

## 2012-07-16 LAB — URINALYSIS, ROUTINE W REFLEX MICROSCOPIC
Bilirubin Urine: NEGATIVE
Glucose, UA: NEGATIVE mg/dL
Hgb urine dipstick: NEGATIVE
Ketones, ur: NEGATIVE mg/dL
Leukocytes, UA: NEGATIVE
Nitrite: NEGATIVE
Protein, ur: NEGATIVE mg/dL
Specific Gravity, Urine: >1.03 — ABNORMAL HIGH (ref 1.005–1.030)
Urobilinogen, UA: 0.2 mg/dL (ref 0.0–1.0)
pH: 5.5 (ref 5.0–8.0)

## 2012-07-16 MED ORDER — HYDROMORPHONE HCL PF 1 MG/ML IJ SOLN
1.0000 mg | Freq: Once | INTRAMUSCULAR | Status: AC
  Administered 2012-07-16: 1 mg via INTRAVENOUS
  Filled 2012-07-16: qty 1

## 2012-07-16 MED ORDER — KETOROLAC TROMETHAMINE 30 MG/ML IJ SOLN
30.0000 mg | Freq: Once | INTRAMUSCULAR | Status: AC
  Administered 2012-07-16: 30 mg via INTRAVENOUS
  Filled 2012-07-16: qty 1

## 2012-07-16 MED ORDER — ONDANSETRON HCL 4 MG/2ML IJ SOLN
4.0000 mg | Freq: Once | INTRAMUSCULAR | Status: AC
  Administered 2012-07-16: 4 mg via INTRAVENOUS
  Filled 2012-07-16: qty 2

## 2012-07-16 MED ORDER — PREGABALIN 50 MG PO CAPS: 50.0000 mg | ORAL_CAPSULE | Freq: Three times a day (TID) | ORAL | Status: DC

## 2012-07-16 NOTE — ED Notes (Signed)
Pt has HX of kidney stones, states this started 4 days prior but has gotten worse today. Pain in rt flank radiates across back.

## 2012-07-16 NOTE — ED Provider Notes (Signed)
History     CSN: [id number]  Arrival date & time 07/16/12  1555   First MD Initiated Contact with Patient 07/16/12 1720      Chief Complaint  Patient presents with  . Abdominal Pain  . Flank Pain    rt side  . Nausea    (Consider location/radiation/quality/duration/timing/severity/associated sxs/prior treatment) HPI Comments: 62 year old male with a history of kidney stones in the past who also has multiple other surgeries such as cervical surgery, thoracic surgery, abdominal surgery. He presents with right sided flank pain which he states has been present for 5-7 days, has been intermittent, gradually getting worse, fluctuates in intensity and does not seem to get worse with position, exertion or deep breathing. The pain radiates around the right flank onto the right abdomen but most of his pain is in his flank. He admits to having occasional dark colored urine but no fevers, no chills. He has mild nausea. He has required lithotripsy in the past for his kidney stones.  The history is provided by the patient and the spouse.    Past Medical History  Diagnosis Date  . Sleep apnea     not tested  . Blood transfusion   . Shingles     10  . Hypertension   . Headache   . Arthritis   . H/O hiatal hernia   . GERD (gastroesophageal reflux disease)     barretts esophagus  . PONV (postoperative nausea and vomiting)     trouble breathing after last surgery  . Tumor liver     no problems  . Cancer     melanoma stage 3 stomach bx    Past Surgical History  Procedure Laterality Date  . Spinal cord stimulator implant      rt hip  . Melanoma excision      lft abdomen  . Shoulder arthroscopy      rt bith defect , spur, rotator cuff  . Thyroid lobectomy      rt  . Leg surgery      x4 crushed   . Knee arthroplasty      lft  . Abdominal surgery      x3  . Chest surgery      lung incisional hernia for hernia  . Lithotripsy    . Hemorrhoid surgery      x2  . Joint replacement       lft knee  . Hernia repair    . Hiatal hernia repair    . Posterior cervical fusion/foraminotomy  08/18/2011    Procedure: POSTERIOR CERVICAL FUSION/FORAMINOTOMY LEVEL 3;  Surgeon: Cristi Loron, MD;  Location: [facility];  Service: Neurosurgery;  Laterality: N/A;  Cervical three-four, four-five, five-six posterior cervical  fusion with instrumentation; Right cervical three-four, four-five laminotomy  . Back surgery      x7    History reviewed. No pertinent family history.  History  Substance Use Topics  . Smoking status: Former Smoker -- 5.00 packs/day    Types: Cigarettes    Quit date: 08/12/1980  . Smokeless tobacco: Not on file     Comment: occ wine  . Alcohol Use: No      Review of Systems  Constitutional: Negative for fever and chills.  HENT: Negative for sore throat and neck pain.   Eyes: Negative for visual disturbance.  Respiratory: Negative for cough and shortness of breath.   Cardiovascular: Negative for chest pain.  Gastrointestinal: Positive for nausea. Negative for vomiting, abdominal pain and  diarrhea.  Genitourinary: Positive for hematuria and flank pain. Negative for dysuria and frequency.  Musculoskeletal: Negative for back pain.  Skin: Negative for rash.  Neurological: Negative for weakness, numbness and headaches.  Hematological: Negative for adenopathy.  Psychiatric/Behavioral: Negative for behavioral problems.    Allergies  Demerol  Home Medications   Current Outpatient Rx  Name  Route  Sig  Dispense  Refill  . calcium-vitamin D (OSCAL WITH D) 500-200 MG-UNIT per tablet   Oral   Take 1 tablet by mouth every evening.         Marland Kitchen dexlansoprazole (DEXILANT) 60 MG capsule   Oral   Take 60 mg by mouth daily.         . fexofenadine (ALLEGRA) 180 MG tablet   Oral   Take 180 mg by mouth every evening.         . fish oil-omega-3 fatty acids 1000 MG capsule   Oral   Take 1 g by mouth 2 (two) times daily.         Marland Kitchen lisinopril  (PRINIVIL,ZESTRIL) 10 MG tablet   Oral   Take 10 mg by mouth daily.         . montelukast (SINGULAIR) 10 MG tablet   Oral   Take 10 mg by mouth daily.          . Multiple Vitamin (MULITIVITAMIN WITH MINERALS) TABS   Oral   Take 1 tablet by mouth daily.         Marland Kitchen oxyCODONE-acetaminophen (PERCOCET) 10-325 MG per tablet   Oral   Take 1 tablet by mouth every 4 (four) hours as needed for pain.   100 tablet   0   . promethazine (PHENERGAN) 25 MG tablet   Oral   Take 1-2 tablets (25-50 mg total) by mouth every 6 (six) hours as needed for nausea.   50 tablet   1   . rosuvastatin (CRESTOR) 10 MG tablet   Oral   Take 10 mg by mouth daily.         . diazepam (VALIUM) 10 MG tablet   Oral   Take 10 mg by mouth daily as needed. Muscle spasms         . pregabalin (LYRICA) 50 MG capsule   Oral   Take 1 capsule (50 mg total) by mouth 3 (three) times daily.   30 capsule   0     BP 101/75  Pulse 88  Temp(Src) 97.3 F (36.3 C) (Oral)  Resp 22  Ht 6' (1.829 m)  Wt 234 lb (106.142 kg)  BMI 31.73 kg/m2  SpO2 98%  Physical Exam  Nursing note and vitals reviewed. Constitutional: He appears well-developed and well-nourished.  Uncomfortable appearing  HENT:  Head: Normocephalic and atraumatic.  Mouth/Throat: Oropharynx is clear and moist. No oropharyngeal exudate.  Eyes: Conjunctivae and EOM are normal. Pupils are equal, round, and reactive to light. Right eye exhibits no discharge. Left eye exhibits no discharge. No scleral icterus.  Neck: Normal range of motion. Neck supple. No JVD present. No thyromegaly present.  Cardiovascular: Normal rate, regular rhythm, normal heart sounds and intact distal pulses.  Exam reveals no gallop and no friction rub.   No murmur heard. Pulmonary/Chest: Effort normal and breath sounds normal. No respiratory distress. He has no wheezes. He has no rales.  Abdominal: Soft. Bowel sounds are normal. He exhibits no distension and no mass. There  is no tenderness.  Mild right CVA tenderness, no abdominal tenderness, no guarding,  no masses, no hernias palpated in the inguinal region  Musculoskeletal: Normal range of motion. He exhibits no edema and no tenderness.  Lymphadenopathy:    He has no cervical adenopathy.  Neurological: He is alert. Coordination normal.  Skin: Skin is warm and dry. No rash noted. No erythema.  Psychiatric: He has a normal mood and affect. His behavior is normal.    ED Course  Procedures (including critical care time)  Labs Reviewed  URINALYSIS, ROUTINE W REFLEX MICROSCOPIC - Abnormal; Notable for the following:    Specific Gravity, Urine >1.030 (*)    All other components within normal limits  BASIC METABOLIC PANEL - Abnormal; Notable for the following:    BUN 28 (*)    GFR calc non Af Amer 88 (*)    All other components within normal limits   Ct Abdomen Pelvis Wo Contrast  07/16/2012  *RADIOLOGY REPORT*  Clinical Data: 62 year old male with abdominal, pelvic and flank pain.  History of urinary calculi.  CT ABDOMEN AND PELVIS WITHOUT CONTRAST  Technique:  Multidetector CT imaging of the abdomen and pelvis was performed following the standard protocol without intravenous contrast.  Comparison: 10/29/2007 CT  Findings: Postoperative changes at the EG junction again noted with moderate hiatal hernia.  A left hepatic cyst is again noted. The spleen, pancreas, gallbladder, adrenal glands and kidneys are unremarkable.  There is no evidence of hydronephrosis or urinary calculi.  Please note that parenchymal abnormalities may be missed as intravenous contrast was not administered.  No free fluid, enlarged lymph nodes, biliary dilation or abdominal aortic aneurysm identified.  Colonic diverticulosis noted without evidence of diverticulitis. No other bowel abnormalities are identified. The appendix is normal. The bladder is unremarkable. Prostate enlargement is present.  No acute or suspicious bony abnormalities are  identified. Posterior fusion changes from L2-S1 noted.  IMPRESSION: No evidence of acute abnormality.   Original Report Authenticated By: Harmon Pier, M.D.    US Renal  07/16/2012  *RADIOLOGY REPORT*  Clinical Data: 62 year old male with right flank pain.  RENAL/URINARY TRACT ULTRASOUND COMPLETE  Comparison:  10/29/2007 CT  Findings:  Right Kidney:  The right kidney is normal in echogenicity and size measuring 10.7 cm.  There is no evidence of hydronephrosis, solid renal mass or definite renal calculi.  Left Kidney:  The left kidney is normal in echogenicity and size measuring 12.4 cm.  A 1.3 cm lower pole cyst is again noted.  There is no evidence of hydronephrosis, solid renal mass or definite renal calculi.  Bladder:  The bladder is normal for degree of filling.  Prostate enlargement is present.  IMPRESSION: Unremarkable kidneys except for left renal cyst.  Prostate enlargement.   Original Report Authenticated By: Harmon Pier, M.D.      1. Flank pain       MDM  At this time the patient has normal vital signs, I suspect he has recurrent nephrolithiasis or ureteral colic. At this time CT scan is unavailable at this hospital, renal ultrasound has been ordered, symptomatic control with hydromorphone and Toradol and Zofran.   UA and BMP pending  Labwork has been unremarkalbe, CT negative (fixed soon after [place] was completed), no definite source for pain - he now states that it is worse with moving around - with 9+ surgeries in past on back, this is most likely source - no neuro defecits or red flags, stable for d/c,  Will start alternative pain med as pt does not want opiates - Lyrica.  Vida Roller, MD 07/16/12 2021

## 2013-03-01 ENCOUNTER — Encounter (INDEPENDENT_AMBULATORY_CARE_PROVIDER_SITE_OTHER): Payer: Self-pay | Admitting: *Deleted

## 2013-03-01 ENCOUNTER — Other Ambulatory Visit (INDEPENDENT_AMBULATORY_CARE_PROVIDER_SITE_OTHER): Payer: Self-pay | Admitting: *Deleted

## 2013-03-01 ENCOUNTER — Ambulatory Visit (INDEPENDENT_AMBULATORY_CARE_PROVIDER_SITE_OTHER): Payer: Medicare Other | Admitting: Internal Medicine

## 2013-03-01 ENCOUNTER — Encounter (INDEPENDENT_AMBULATORY_CARE_PROVIDER_SITE_OTHER): Payer: Self-pay | Admitting: Internal Medicine

## 2013-03-01 VITALS — BP 102/68 | HR 60 | Temp 98.1°F | Ht 72.0 in | Wt 224.0 lb

## 2013-03-01 DIAGNOSIS — I10 Essential (primary) hypertension: Secondary | ICD-10-CM | POA: Insufficient documentation

## 2013-03-01 DIAGNOSIS — K7689 Other specified diseases of liver: Secondary | ICD-10-CM

## 2013-03-01 DIAGNOSIS — K769 Liver disease, unspecified: Secondary | ICD-10-CM | POA: Insufficient documentation

## 2013-03-01 DIAGNOSIS — K2270 Barrett's esophagus without dysplasia: Secondary | ICD-10-CM | POA: Insufficient documentation

## 2013-03-01 DIAGNOSIS — K219 Gastro-esophageal reflux disease without esophagitis: Secondary | ICD-10-CM

## 2013-03-01 NOTE — Progress Notes (Signed)
Subjective:     Patient ID: [name], male   DOB: 1950/11/18, 62 y.o.   MRN: [id number]  HPI Referred to our office by Dr. Regino Schultze for EGD. Needs surveillance for a Barrett's esophagus. Last EGD in 2011 did not reveal a Barrett's esophagus.  He also tells me he has a hx of liver ? cyst which needs a surveillance. Per Dr. Patty Sermons notes 2007, He was found to have 4.5 x 4cm mass in the rt lobe of the liver felt to be hemangioma although it has grown in size.+    Per Dr. Patty Sermons notes patient has a hx of short segment Barrett's. EGD 2011:  A 3 cm sized sliding hiatal hernia. No evidence oof erosive or ulcerative oesophagitis. Stomach full of food debris with a patent pylorus.  Colonoscopy 2008: 4mm polyp ablated via cold biopsy from hepatic flexure.  Biopsy: Benign colonic polyp.  07/16/2012 CT abdomen/pelvis wo CM:  A left hepatic cyst is again noted.  The spleen, pancreas, gallbladder, adrenal glands and kidneys are  unremarkable. There is no evidence of hydronephrosis or urinary  calculi. 10/21/2005 CT abdomen/pelvis with CM:  1. The liver lesion demonstrates peripheral enhancement and is only slightly larger than it was 3 years ago. The appearance remains most compatible with an incidental hemangioma.  2. No suspicious or acute intraabdominal findings are demonstrated. Left renal cyst and anterior abdominal wall hernia repair appear stable.    Appetite is good. He does have some epigastric tenderness. He says he has frequent nausea. He usually has BM about daily. No melena or bright red rectal bleeding. No dysphagia.  Acid reflux is controlled with Dexilant. He exercises on his bike when his back is not hurting    Review of Systems Past Medical History  Diagnosis Date  . Sleep apnea     not tested  . Blood transfusion   . Shingles     10  . Hypertension   . Headache(784.0)   . Arthritis   . H/O hiatal hernia   . GERD (gastroesophageal reflux disease)     barretts  esophagus  . PONV (postoperative nausea and vomiting)     trouble breathing after last surgery  . Tumor liver     no problems  . Cancer     melanoma stage 3 stomach bx   Past Surgical History  Procedure Laterality Date  . Spinal cord stimulator implant      rt hip  . Melanoma excision      lft abdomen  . Shoulder arthroscopy      rt bith defect , spur, rotator cuff  . Thyroid lobectomy      rt  . Leg surgery      x4 crushed   . Knee arthroplasty      lft  . Abdominal surgery      x3  . Chest surgery      lung incisional hernia for hernia  . Lithotripsy    . Hemorrhoid surgery      x2  . Joint replacement      lft knee  . Hernia repair    . Hiatal hernia repair    . Posterior cervical fusion/foraminotomy  08/18/2011    Procedure: POSTERIOR CERVICAL FUSION/FORAMINOTOMY LEVEL 3;  Surgeon: Cristi Loron, MD;  Location: [facility];  Service: Neurosurgery;  Laterality: N/A;  Cervical three-four, four-five, five-six posterior cervical  fusion with instrumentation; Right cervical three-four, four-five laminotomy  . Back surgery  x7  . Back surgeries      x 9   Current Outpatient Prescriptions on File Prior to Visit  Medication Sig Dispense Refill  . calcium-vitamin D (OSCAL WITH D) 500-200 MG-UNIT per tablet Take 1 tablet by mouth every evening.      Marland Kitchen dexlansoprazole (DEXILANT) 60 MG capsule Take 60 mg by mouth daily.      . diazepam (VALIUM) 10 MG tablet Take 10 mg by mouth daily as needed. Muscle spasms      . fexofenadine (ALLEGRA) 180 MG tablet Take 180 mg by mouth every evening.      . fish oil-omega-3 fatty acids 1000 MG capsule Take 1 g by mouth 2 (two) times daily.      Marland Kitchen lisinopril (PRINIVIL,ZESTRIL) 10 MG tablet Take 10 mg by mouth daily.      . montelukast (SINGULAIR) 10 MG tablet Take 10 mg by mouth daily.       . Multiple Vitamin (MULITIVITAMIN WITH MINERALS) TABS Take 1 tablet by mouth daily.      Marland Kitchen oxyCODONE-acetaminophen (PERCOCET) 10-325 MG per  tablet Take 1 tablet by mouth every 4 (four) hours as needed for pain.  100 tablet  0  . rosuvastatin (CRESTOR) 10 MG tablet Take 10 mg by mouth daily.       No current facility-administered medications on file prior to visit.   Allergies  Allergen Reactions  . Demerol Other (See Comments)    MAKES ME CRAZY!!        Objective:   Physical Exam  Filed Vitals:   03/01/13 1446  BP: 102/68  Pulse: 60  Temp: 98.1 F (36.7 C)  Height: 6' (1.829 m)  Weight: 224 lb (101.606 kg)  \ Alert and oriented. Skin warm and dry. Oral mucosa is moist.   . Sclera anicteric, conjunctivae is pink. Thyroid not enlarged. No cervical lymphadenopathy. Lungs clear. Heart regular rate and rhythm.  Abdomen is soft. Bowel sounds are positive. Liver felt 2 fingerbreaths below mid costal margin.  No abdominal masses felt. No tenderness.  No edema to lower extremities.       Assessment:    Barrett's esophagus. Needs surveillance. GERD controlled with Dexilant at this time. Hx of liver hemiangioma: needs surveillance.    Plan:    Surveillance EGD., CT abdomen/pelvis with CM, CMET.

## 2013-03-01 NOTE — Patient Instructions (Addendum)
CT abdomen/pelvis: liver cyst. CMet. EGD

## 2013-03-02 LAB — COMPREHENSIVE METABOLIC PANEL
ALT: 22 U/L (ref 0–53)
AST: 26 U/L (ref 0–37)
Albumin: 4.4 g/dL (ref 3.5–5.2)
Alkaline Phosphatase: 55 U/L (ref 39–117)
BUN: 18 mg/dL (ref 6–23)
CO2: 28 mEq/L (ref 19–32)
Calcium: 9.6 mg/dL (ref 8.4–10.5)
Chloride: 105 mEq/L (ref 96–112)
Creat: 0.94 mg/dL (ref 0.50–1.35)
Glucose, Bld: 81 mg/dL (ref 70–99)
Potassium: 4.1 mEq/L (ref 3.5–5.3)
Sodium: 139 mEq/L (ref 135–145)
Total Bilirubin: 0.6 mg/dL (ref 0.3–1.2)
Total Protein: 6.8 g/dL (ref 6.0–8.3)

## 2013-03-07 ENCOUNTER — Encounter (INDEPENDENT_AMBULATORY_CARE_PROVIDER_SITE_OTHER): Payer: Self-pay

## 2013-03-14 ENCOUNTER — Encounter (HOSPITAL_COMMUNITY): Payer: Self-pay | Admitting: Pharmacy Technician

## 2013-03-15 ENCOUNTER — Telehealth (INDEPENDENT_AMBULATORY_CARE_PROVIDER_SITE_OTHER): Payer: Self-pay | Admitting: Internal Medicine

## 2013-03-15 ENCOUNTER — Ambulatory Visit (HOSPITAL_COMMUNITY)
Admission: RE | Admit: 2013-03-15 | Discharge: 2013-03-15 | Disposition: A | Discharge status: Home / Self Care | Payer: Medicare Other | Source: Ambulatory Visit | Attending: Internal Medicine | Admitting: Internal Medicine

## 2013-03-15 ENCOUNTER — Encounter (HOSPITAL_COMMUNITY): Payer: Self-pay

## 2013-03-15 DIAGNOSIS — K7689 Other specified diseases of liver: Secondary | ICD-10-CM

## 2013-03-15 DIAGNOSIS — K5730 Diverticulosis of large intestine without perforation or abscess without bleeding: Secondary | ICD-10-CM | POA: Insufficient documentation

## 2013-03-15 DIAGNOSIS — K769 Liver disease, unspecified: Secondary | ICD-10-CM

## 2013-03-15 MED ORDER — IOHEXOL 300 MG/ML  SOLN
100.0000 mL | Freq: Once | INTRAMUSCULAR | Status: AC | PRN
  Administered 2013-03-15: 100 mL via INTRAVENOUS

## 2013-03-15 NOTE — Telephone Encounter (Signed)
Spoke with patient.

## 2013-03-15 NOTE — Addendum Note (Signed)
Addended by: Len Blalock on: 03/15/2013 05:00 PM   Modules accepted: Orders

## 2013-03-15 NOTE — Telephone Encounter (Signed)
Unable to do MRI: pain has a stimulator implant.

## 2013-03-16 NOTE — Telephone Encounter (Signed)
He does not need further testing

## 2013-03-16 NOTE — Telephone Encounter (Signed)
Ok, what do I need to schedule

## 2013-03-22 ENCOUNTER — Encounter (HOSPITAL_COMMUNITY): Payer: Self-pay

## 2013-03-22 ENCOUNTER — Encounter (HOSPITAL_COMMUNITY)
Admission: RE | Admit: 2013-03-22 | Discharge: 2013-03-22 | Disposition: A | Discharge status: Home / Self Care | Payer: Medicare Other | Source: Ambulatory Visit | Attending: Internal Medicine | Admitting: Internal Medicine

## 2013-03-22 ENCOUNTER — Other Ambulatory Visit: Payer: Self-pay

## 2013-03-22 DIAGNOSIS — Z01810 Encounter for preprocedural cardiovascular examination: Secondary | ICD-10-CM | POA: Insufficient documentation

## 2013-03-22 DIAGNOSIS — Z01812 Encounter for preprocedural laboratory examination: Secondary | ICD-10-CM | POA: Insufficient documentation

## 2013-03-22 DIAGNOSIS — Z01818 Encounter for other preprocedural examination: Secondary | ICD-10-CM | POA: Insufficient documentation

## 2013-03-22 LAB — BASIC METABOLIC PANEL
BUN: 16 mg/dL (ref 6–23)
CO2: 29 mEq/L (ref 19–32)
Calcium: 9.4 mg/dL (ref 8.4–10.5)
Chloride: 100 mEq/L (ref 96–112)
Creatinine, Ser: 0.96 mg/dL (ref 0.50–1.35)
GFR calc Af Amer: >90 mL/min (ref >90)
GFR calc non Af Amer: 88 mL/min — ABNORMAL LOW (ref >90)
Glucose, Bld: 87 mg/dL (ref 70–99)
Potassium: 4.2 mEq/L (ref 3.5–5.1)
Sodium: 137 mEq/L (ref 135–145)

## 2013-03-22 LAB — HEMOGLOBIN AND HEMATOCRIT, BLOOD
HCT: 46.2 % (ref 39.0–52.0)
Hemoglobin: 15.7 g/dL (ref 13.0–17.0)

## 2013-03-22 NOTE — Patient Instructions (Signed)
[name]  03/22/2013   Your procedure is scheduled on:  03/28/2013  Report to [facility] at 6:15 AM.  Call this number if you have problems the morning of surgery: [phone]   Remember:   Do not eat food or drink liquids after midnight.   Take these medicines the morning of surgery with A SIP OF WATER: Dexilant, Neurontin, Lisinopril, Singulair   Do not wear jewelry, make-up or nail polish.  Do not wear lotions, powders, or perfumes. You may wear deodorant.  Do not shave 48 hours prior to surgery. Men may shave face and neck.  Do not bring valuables to the hospital.  [facility] is not responsible                  for any belongings or valuables.               Contacts, dentures or bridgework may not be worn into surgery.  Leave suitcase in the car. After surgery it may be brought to your room.  For patients admitted to the hospital, discharge time is determined by your                treatment team.               Patients discharged the day of surgery will not be allowed to drive  home.  Name and phone number of your driver: Sheikh Leverich  Special Instructions: N/A   Please read over the following fact sheets that you were given: Care and Recovery After Surgery

## 2013-03-28 ENCOUNTER — Encounter (HOSPITAL_COMMUNITY): Payer: Self-pay | Admitting: *Deleted

## 2013-03-28 ENCOUNTER — Encounter (HOSPITAL_COMMUNITY): Payer: Medicare Other | Admitting: Anesthesiology

## 2013-03-28 ENCOUNTER — Ambulatory Visit (HOSPITAL_COMMUNITY): Payer: Medicare Other | Admitting: Anesthesiology

## 2013-03-28 ENCOUNTER — Ambulatory Visit (HOSPITAL_COMMUNITY)
Admission: RE | Admit: 2013-03-28 | Discharge: 2013-03-28 | Disposition: A | Discharge status: Home / Self Care | Payer: Medicare Other | Source: Ambulatory Visit | Attending: Internal Medicine | Admitting: Internal Medicine

## 2013-03-28 ENCOUNTER — Encounter (HOSPITAL_COMMUNITY)
Admission: RE | Disposition: A | Discharge status: Home / Self Care | Payer: Self-pay | Source: Ambulatory Visit | Attending: Internal Medicine

## 2013-03-28 DIAGNOSIS — D131 Benign neoplasm of stomach: Secondary | ICD-10-CM | POA: Insufficient documentation

## 2013-03-28 DIAGNOSIS — Z9889 Other specified postprocedural states: Secondary | ICD-10-CM

## 2013-03-28 DIAGNOSIS — K2270 Barrett's esophagus without dysplasia: Secondary | ICD-10-CM | POA: Insufficient documentation

## 2013-03-28 DIAGNOSIS — K2289 Other specified disease of esophagus: Secondary | ICD-10-CM

## 2013-03-28 DIAGNOSIS — K219 Gastro-esophageal reflux disease without esophagitis: Secondary | ICD-10-CM | POA: Insufficient documentation

## 2013-03-28 DIAGNOSIS — K228 Other specified diseases of esophagus: Secondary | ICD-10-CM

## 2013-03-28 DIAGNOSIS — I10 Essential (primary) hypertension: Secondary | ICD-10-CM | POA: Insufficient documentation

## 2013-03-28 HISTORY — PX: ESOPHAGOGASTRODUODENOSCOPY (EGD) WITH PROPOFOL: SHX5813

## 2013-03-28 HISTORY — PX: BIOPSY: SHX5522

## 2013-03-28 SURGERY — ESOPHAGOGASTRODUODENOSCOPY (EGD) WITH PROPOFOL
Anesthesia: Monitor Anesthesia Care | Site: Esophagus

## 2013-03-28 MED ORDER — BUTAMBEN-TETRACAINE-BENZOCAINE 2-2-14 % EX AERO
2.0000 | INHALATION_SPRAY | Freq: Once | CUTANEOUS | Status: DC
  Filled 2013-03-28: qty 56

## 2013-03-28 MED ORDER — FENTANYL CITRATE 0.05 MG/ML IJ SOLN
INTRAMUSCULAR | Status: AC
  Filled 2013-03-28: qty 2

## 2013-03-28 MED ORDER — MIDAZOLAM HCL 2 MG/2ML IJ SOLN
1.0000 mg | INTRAMUSCULAR | Status: DC | PRN
  Administered 2013-03-28: 2 mg via INTRAVENOUS

## 2013-03-28 MED ORDER — ONDANSETRON HCL 4 MG/2ML IJ SOLN
INTRAMUSCULAR | Status: AC
  Filled 2013-03-28: qty 2

## 2013-03-28 MED ORDER — STERILE WATER FOR IRRIGATION IR SOLN
Status: DC | PRN
  Administered 2013-03-28: 08:00:00

## 2013-03-28 MED ORDER — BUTAMBEN-TETRACAINE-BENZOCAINE 2-2-14 % EX AERO
1.0000 | INHALATION_SPRAY | Freq: Once | CUTANEOUS | Status: DC
  Filled 2013-03-28: qty 56

## 2013-03-28 MED ORDER — LACTATED RINGERS IV SOLN: INTRAVENOUS | Status: DC

## 2013-03-28 MED ORDER — ONDANSETRON HCL 4 MG/2ML IJ SOLN
4.0000 mg | Freq: Once | INTRAMUSCULAR | Status: AC
  Administered 2013-03-28: 4 mg via INTRAVENOUS
  Filled 2013-03-28: qty 2

## 2013-03-28 MED ORDER — PROPOFOL 10 MG/ML IV EMUL
INTRAVENOUS | Status: AC
  Filled 2013-03-28: qty 20

## 2013-03-28 MED ORDER — DEXAMETHASONE SODIUM PHOSPHATE 4 MG/ML IJ SOLN
INTRAMUSCULAR | Status: AC
  Filled 2013-03-28: qty 1

## 2013-03-28 MED ORDER — FENTANYL CITRATE 0.05 MG/ML IJ SOLN
25.0000 ug | INTRAMUSCULAR | Status: AC
  Administered 2013-03-28: 25 ug via INTRAVENOUS

## 2013-03-28 MED ORDER — LIDOCAINE HCL (CARDIAC) 10 MG/ML IV SOLN
INTRAVENOUS | Status: DC | PRN
  Administered 2013-03-28: 20 mg via INTRAVENOUS

## 2013-03-28 MED ORDER — PROPOFOL INFUSION 10 MG/ML OPTIME
INTRAVENOUS | Status: DC | PRN
  Administered 2013-03-28: 200 ug/kg/min via INTRAVENOUS

## 2013-03-28 MED ORDER — FENTANYL CITRATE 0.05 MG/ML IJ SOLN
INTRAMUSCULAR | Status: DC | PRN
  Administered 2013-03-28: 50 ug via INTRAVENOUS

## 2013-03-28 MED ORDER — ONDANSETRON HCL 4 MG/2ML IJ SOLN: 4.0000 mg | Freq: Once | INTRAMUSCULAR | Status: DC | PRN

## 2013-03-28 MED ORDER — SODIUM CHLORIDE 0.9 % IV SOLN
INTRAVENOUS | Status: DC | PRN
  Administered 2013-03-28: 07:00:00 via INTRAVENOUS

## 2013-03-28 MED ORDER — LIDOCAINE HCL (PF) 1 % IJ SOLN
INTRAMUSCULAR | Status: AC
  Filled 2013-03-28: qty 5

## 2013-03-28 MED ORDER — DEXAMETHASONE SODIUM PHOSPHATE 4 MG/ML IJ SOLN
4.0000 mg | Freq: Once | INTRAMUSCULAR | Status: AC
  Administered 2013-03-28: 4 mg via INTRAVENOUS

## 2013-03-28 MED ORDER — MIDAZOLAM HCL 2 MG/2ML IJ SOLN
INTRAMUSCULAR | Status: AC
  Filled 2013-03-28: qty 2

## 2013-03-28 MED ORDER — FENTANYL CITRATE 0.05 MG/ML IJ SOLN: 25.0000 ug | INTRAMUSCULAR | Status: DC | PRN

## 2013-03-28 SURGICAL SUPPLY — 19 items
BLOCK BITE 60FR ADLT L/F BLUE (MISCELLANEOUS) ×1 IMPLANT
ELECT REM PT RETURN 9FT ADLT (ELECTROSURGICAL)
ELECTRODE REM PT RTRN 9FT ADLT (ELECTROSURGICAL) IMPLANT
FLOOR PAD 36X40 (MISCELLANEOUS) ×2
FORCEP COLD BIOPSY (CUTTING FORCEPS) IMPLANT
FORCEPS BIOP RAD 4 LRG CAP 4 (CUTTING FORCEPS) ×1 IMPLANT
FORMALIN 10 PREFIL 20ML (MISCELLANEOUS) ×1 IMPLANT
MANIFOLD NEPTUNE II (INSTRUMENTS) ×2 IMPLANT
NDL SCLEROTHERAPY 25GX240 (NEEDLE) IMPLANT
NEEDLE SCLEROTHERAPY 25GX240 (NEEDLE) IMPLANT
PAD FLOOR 36X40 (MISCELLANEOUS) IMPLANT
PROBE APC STR FIRE (PROBE) IMPLANT
PROBE INJECTION GOLD (MISCELLANEOUS)
PROBE INJECTION GOLD 7FR (MISCELLANEOUS) IMPLANT
SNARE ROTATE MED OVAL 20MM (MISCELLANEOUS) IMPLANT
SYR 50ML LL SCALE MARK (SYRINGE) ×1 IMPLANT
TUBING ENDO SMARTCAP PENTAX (MISCELLANEOUS) ×1 IMPLANT
TUBING IRRIGATION ENDOGATOR (MISCELLANEOUS) ×1 IMPLANT
WATER STERILE IRR 1000ML POUR (IV SOLUTION) ×1 IMPLANT

## 2013-03-28 NOTE — Anesthesia Procedure Notes (Signed)
Procedure Name: MAC Date/Time: 03/28/2013 7:35 AM Performed by: Franco Nones Pre-anesthesia Checklist: Patient identified, Emergency Drugs available, Suction available, Timeout performed and Patient being monitored Patient Re-evaluated:Patient Re-evaluated prior to inductionOxygen Delivery Method: Non-rebreather mask

## 2013-03-28 NOTE — Anesthesia Postprocedure Evaluation (Signed)
Anesthesia Post Note  Patient: [name]  Procedure(s) Performed: Procedure(s) (LRB): ESOPHAGOGASTRODUODENOSCOPY (EGD) WITH PROPOFOL (N/A) Distal Esophageal Biopsy (N/A)  Anesthesia type: MAC  Patient location: PACU  Post pain: Pain level controlled  Post assessment: Post-op Vital signs reviewed, Patient's Cardiovascular Status Stable, Respiratory Function Stable, Patent Airway, No signs of Nausea or vomiting and Pain level controlled  Last Vitals:  Filed Vitals:   03/28/13 0807  BP: 112/63  Temp: 36.6 C  Resp: 12    Post vital signs: Reviewed and stable  Level of consciousness: awake and alert   Complications: No apparent anesthesia complications

## 2013-03-28 NOTE — Transfer of Care (Signed)
Immediate Anesthesia Transfer of Care Note  Patient: [name]  Procedure(s) Performed: Procedure(s) (LRB): ESOPHAGOGASTRODUODENOSCOPY (EGD) WITH PROPOFOL (N/A) Distal Esophageal Biopsy (N/A)  Patient Location: PACU  Anesthesia Type: MAC  Level of Consciousness: awake  Airway & Oxygen Therapy: Patient Spontanous Breathing. Nasal cannula  Post-op Assessment: Report given to PACU RN, Post -op Vital signs reviewed and stable and Patient moving all extremities  Post vital signs: Reviewed and stable  Complications: No apparent anesthesia complications

## 2013-03-28 NOTE — Op Note (Signed)
EGD PROCEDURE REPORT  PATIENT:  [name]  MR#:  [id number] Birthdate:  [birth year], 62 y.o., male Endoscopist:  Dr. Malissa Hippo, MD Referred By:  Dr. Kirk Ruths, MD  Procedure Date: 03/28/2013  Procedure:   EGD  Indications:  Patient is 63 year old Caucasian male who has chronic GERD complicated by short segment Barrett's esophagus. He's had three anti-reflux surgeries but with limited success. He denies dysphagia. He is undergoing [facility] EGD.           Informed Consent:  The risks, benefits, alternatives & imponderables which include, but are not limited to, bleeding, infection, perforation, drug reaction and potential missed lesion have been reviewed.  The potential for biopsy, lesion removal, esophageal dilation, etc. have also been discussed.  Questions have been answered.  All parties agreeable.  Please see history & physical in medical record for more information.  Medications:  Cetacaine spray topically for oropharyngeal anesthesia Monitored anesthesia care. Please see anesthesia records for details.  Description of procedure:  The endoscope was introduced through the mouth and advanced to the second portion of the duodenum without difficulty or limitations. The mucosal surfaces were surveyed very carefully during advancement of the scope and upon withdrawal.  Findings:  Esophagus:   Mucosa of the esophagus was normal. The distal end was somewhat tortuous. Two patches of salmon-colored mucosa were noted about 10-12 mm in length. One on the right side was longer than one on the left side. No erosions ulcer or stricture noted. GEJ:  38 cm Stomach:  Stomach was empty other than single piece of food debris which was felt to be french fries. Stomach distended very well with insufflation. Few small hyperplastic appearing polyps are noted the gastric body. These were left alone. Pyloric channel was patent. Angularis was unremarkable. Loose fundal wrap noted. Duodenum:   Normal bulbar and post bulbar mucosa.  Therapeutic/Diagnostic Maneuvers Performed:  Biopsy taken from salmon-colored mucosa.  Complications:   None.  Impression: No evidence of erosive esophagitis. Loose fundal wrap. Short segment Barrett's esophagus. Biopsy taken for routine histology. Few small hyperplastic-appearing polyps the gastric body and these were left alone. Scant amount of food debris in the stomach with patent pylorus.   Recommendations:  Standard instructions given. I will be contacting dictation results of biopsy and further recommendations.  REHMAN,NAJEEB U  03/28/2013  8:00 AM  CC: Dr. Kirk Ruths, MD & Dr. Bonnetta Barry ref. provider found

## 2013-03-28 NOTE — Anesthesia Preprocedure Evaluation (Signed)
Anesthesia Evaluation  Patient identified by MRN, date of birth, ID band Patient awake    Reviewed: Allergy & Precautions, H&P , NPO status , Patient's Chart, lab work & pertinent test results  History of Anesthesia Complications (+) PONV and history of anesthetic complications  Airway Mallampati: I TM Distance: >3 FB Neck ROM: Full    Dental  (+) Teeth Intact and Dental Advisory Given,    Pulmonary sleep apnea ,   PORTABLE CHEST - 1 VIEW   Comparison: 03/31/2010 and earlier.   Findings: Semi upright AP portable view at 0318 hours.  Upper thoracic fusion hardware and lower thoracic spinal cord stimulator re-identified.  Better lung volumes.   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    No pneumothorax, pulmonary edema, pleural effusion or acute pulmonary opacity.   IMPRESSION: No acute cardiopulmonary abnormality.    breath sounds clear to auscultation  Pulmonary exam normal       Cardiovascular Exercise Tolerance: Poor hypertension, Pt. on medications Rhythm:Regular Rate:Normal     Neuro/Psych  Headaches, Spinal Cord Stimulator  Right Hip    GI/Hepatic Neg liver ROS, hiatal hernia, GERD-  Controlled and Medicated,Barrett's Disease   Endo/Other  negative endocrine ROS  Renal/GU negative Renal ROS     Musculoskeletal  (+) Arthritis -, Osteoarthritis,    Abdominal Normal abdominal exam  (+)   Peds  Hematology negative hematology ROS (+)   Anesthesia Other Findings Gum Line surgery in last month.  Pt states it is still not healed.  Reproductive/Obstetrics                           Anesthesia Physical Anesthesia Plan  ASA: III  Anesthesia Plan: MAC   Post-op Pain Management:    Induction: Intravenous  Airway Management Planned: Simple Face Mask  Additional Equipment:   Intra-op Plan:    Post-operative Plan:   Informed Consent: I have reviewed the patients History and Physical, chart, labs and discussed the procedure including the risks, benefits and alternatives for the proposed anesthesia with the patient or authorized representative who has indicated his/her understanding and acceptance.     Plan Discussed with:   Anesthesia Plan Comments:         Anesthesia Quick Evaluation

## 2013-03-28 NOTE — H&P (Signed)
[name] is an 62 y.o. male.   Chief Complaint: Patient is here for EGD. HPI: Patient is 62 year old Caucasian male who has history of chronic GERD complicated by short segment Barrett's esophagus who is here for surveillance EGD. He has intermittent heartburn and regurgitation but he denies abdominal pain nausea vomiting dysphagia or melena. His appetite is normal and his weight has been stable. His last EGD was about 4 years ago. He is undergoing EGD with propofol. He has not been able to tolerate conscious sedation.  Past Medical History  Diagnosis Date  . Sleep apnea     not tested  . Blood transfusion   . Shingles     10  . Hypertension   . Headache(784.0)   . Arthritis   . H/O hiatal hernia   . GERD (gastroesophageal reflux disease)     barretts esophagus  . PONV (postoperative nausea and vomiting)     trouble breathing after last surgery  . Tumor liver     no problems  . Cancer 2007    melanoma stage 3 stomach bx    Past Surgical History  Procedure Laterality Date  . Spinal cord stimulator implant      rt hip  . Melanoma excision      lft abdomen  . Shoulder arthroscopy      rt bith defect , spur, rotator cuff  . Thyroid lobectomy      rt  . Leg surgery      x4 crushed   . Knee arthroplasty      lft  . Abdominal surgery      x3  . Chest surgery      lung incisional hernia for hernia  . Lithotripsy    . Hemorrhoid surgery      x2  . Joint replacement      lft knee  . Hernia repair    . Hiatal hernia repair    . Posterior cervical fusion/foraminotomy  08/18/2011    Procedure: POSTERIOR CERVICAL FUSION/FORAMINOTOMY LEVEL 3;  Surgeon: Cristi Loron, MD;  Location: [facility];  Service: Neurosurgery;  Laterality: N/A;  Cervical three-four, four-five, five-six posterior cervical  fusion with instrumentation; Right cervical three-four, four-five laminotomy  . Back surgeries      x 9  . Back surgery      x8  . Neck surgery  2012    History  reviewed. No pertinent family history. Social History:  reports that he quit smoking about 32 years ago. His smoking use included Cigarettes. He smoked 5.00 packs per day. He has quit using smokeless tobacco. He reports that he does not drink alcohol or use illicit drugs.  Allergies:  Allergies  Allergen Reactions  . Demerol Other (See Comments)    MAKES ME CRAZY!!    Medications Prior to Admission  Medication Sig Dispense Refill  . calcium-vitamin D (OSCAL WITH D) 500-200 MG-UNIT per tablet Take 1 tablet by mouth 2 (two) times daily.       Marland Kitchen dexlansoprazole (DEXILANT) 60 MG capsule Take 60 mg by mouth daily.      . diazepam (VALIUM) 10 MG tablet Take 10 mg by mouth daily as needed. Muscle spasms      . fexofenadine (ALLEGRA) 180 MG tablet Take 180 mg by mouth every evening.      . fish oil-omega-3 fatty acids 1000 MG capsule Take 1 g by mouth 2 (two) times daily.      Marland Kitchen gabapentin (NEURONTIN) 600 MG  tablet Take 600 mg by mouth 3 (three) times daily.      Marland Kitchen lisinopril (PRINIVIL,ZESTRIL) 10 MG tablet Take 10 mg by mouth daily.      . montelukast (SINGULAIR) 10 MG tablet Take 10 mg by mouth daily.       . Multiple Vitamin (MULITIVITAMIN WITH MINERALS) TABS Take 1 tablet by mouth daily.      . ondansetron (ZOFRAN) 4 MG tablet Take 4 mg by mouth every 8 (eight) hours as needed for nausea.      Marland Kitchen oxyCODONE-acetaminophen (PERCOCET) 10-325 MG per tablet Take 1 tablet by mouth every 4 (four) hours as needed for pain.  100 tablet  0  . promethazine (PHENERGAN) 25 MG tablet Take 25 mg by mouth every 6 (six) hours as needed for nausea.      . rosuvastatin (CRESTOR) 10 MG tablet Take 10 mg by mouth daily.        No results found for this or any previous visit (from the past 48 hour(s)). No results found.  ROS  Temperature 97.8 F (36.6 C). Physical Exam  Constitutional: He appears well-developed and well-nourished.  HENT:  Mouth/Throat: Oropharynx is clear and moist.  Eyes: No scleral  icterus.  Neck: No tracheal deviation present. No thyromegaly present.  Cardiovascular: Normal rate, regular rhythm and normal heart sounds.   No murmur heard. Respiratory: Effort normal and breath sounds normal.  GI: Soft. Bowel sounds are normal. He exhibits no distension and no mass. There is no tenderness.  Musculoskeletal: He exhibits no edema.  Lymphadenopathy:    He has no cervical adenopathy.  Neurological: He is alert.  Skin: Skin is warm.     Assessment/Plan Chronic GERD complicated by short segment Barrett's esophagus. [facility] with propofol.  REHMAN,NAJEEB U 03/28/2013, 7:22 AM

## 2013-03-30 ENCOUNTER — Encounter (HOSPITAL_COMMUNITY): Payer: Self-pay | Admitting: Internal Medicine

## 2013-04-05 ENCOUNTER — Other Ambulatory Visit (INDEPENDENT_AMBULATORY_CARE_PROVIDER_SITE_OTHER): Payer: Self-pay | Admitting: Internal Medicine

## 2013-04-05 DIAGNOSIS — K219 Gastro-esophageal reflux disease without esophagitis: Secondary | ICD-10-CM

## 2013-04-07 ENCOUNTER — Encounter (INDEPENDENT_AMBULATORY_CARE_PROVIDER_SITE_OTHER): Payer: Self-pay | Admitting: *Deleted

## 2013-04-11 ENCOUNTER — Encounter (HOSPITAL_COMMUNITY)
Admission: RE | Admit: 2013-04-11 | Discharge: 2013-04-11 | Disposition: A | Discharge status: Home / Self Care | Payer: Medicare Other | Source: Ambulatory Visit | Attending: Internal Medicine | Admitting: Internal Medicine

## 2013-04-11 ENCOUNTER — Encounter (HOSPITAL_COMMUNITY): Payer: Self-pay

## 2013-04-11 DIAGNOSIS — K219 Gastro-esophageal reflux disease without esophagitis: Secondary | ICD-10-CM

## 2013-04-11 MED ORDER — TECHNETIUM TC 99M SULFUR COLLOID
2.0000 | Freq: Once | INTRAVENOUS | Status: AC | PRN
  Administered 2013-04-11: 2 via ORAL

## 2013-07-23 ENCOUNTER — Emergency Department (HOSPITAL_COMMUNITY)
Admission: EM | Admit: 2013-07-23 | Discharge: 2013-07-23 | Disposition: A | Discharge status: Home / Self Care | Payer: Medicare Other | Attending: Emergency Medicine | Admitting: Emergency Medicine

## 2013-07-23 ENCOUNTER — Encounter (HOSPITAL_COMMUNITY): Payer: Self-pay | Admitting: Emergency Medicine

## 2013-07-23 ENCOUNTER — Emergency Department (HOSPITAL_COMMUNITY): Payer: Medicare Other

## 2013-07-23 DIAGNOSIS — W010XXA Fall on same level from slipping, tripping and stumbling without subsequent striking against object, initial encounter: Secondary | ICD-10-CM | POA: Insufficient documentation

## 2013-07-23 DIAGNOSIS — Z8619 Personal history of other infectious and parasitic diseases: Secondary | ICD-10-CM | POA: Insufficient documentation

## 2013-07-23 DIAGNOSIS — Z85820 Personal history of malignant melanoma of skin: Secondary | ICD-10-CM | POA: Insufficient documentation

## 2013-07-23 DIAGNOSIS — S3981XA Other specified injuries of abdomen, initial encounter: Secondary | ICD-10-CM | POA: Insufficient documentation

## 2013-07-23 DIAGNOSIS — M129 Arthropathy, unspecified: Secondary | ICD-10-CM | POA: Insufficient documentation

## 2013-07-23 DIAGNOSIS — W19XXXA Unspecified fall, initial encounter: Secondary | ICD-10-CM

## 2013-07-23 DIAGNOSIS — K219 Gastro-esophageal reflux disease without esophagitis: Secondary | ICD-10-CM | POA: Insufficient documentation

## 2013-07-23 DIAGNOSIS — Y9389 Activity, other specified: Secondary | ICD-10-CM | POA: Insufficient documentation

## 2013-07-23 DIAGNOSIS — Y929 Unspecified place or not applicable: Secondary | ICD-10-CM | POA: Insufficient documentation

## 2013-07-23 DIAGNOSIS — S20219A Contusion of unspecified front wall of thorax, initial encounter: Secondary | ICD-10-CM | POA: Insufficient documentation

## 2013-07-23 DIAGNOSIS — Z79899 Other long term (current) drug therapy: Secondary | ICD-10-CM | POA: Insufficient documentation

## 2013-07-23 DIAGNOSIS — W1809XA Striking against other object with subsequent fall, initial encounter: Secondary | ICD-10-CM | POA: Insufficient documentation

## 2013-07-23 DIAGNOSIS — IMO0002 Reserved for concepts with insufficient information to code with codable children: Secondary | ICD-10-CM | POA: Insufficient documentation

## 2013-07-23 DIAGNOSIS — Z87891 Personal history of nicotine dependence: Secondary | ICD-10-CM | POA: Insufficient documentation

## 2013-07-23 DIAGNOSIS — I10 Essential (primary) hypertension: Secondary | ICD-10-CM | POA: Insufficient documentation

## 2013-07-23 DIAGNOSIS — Z9889 Other specified postprocedural states: Secondary | ICD-10-CM | POA: Insufficient documentation

## 2013-07-23 HISTORY — DX: Barrett's esophagus without dysplasia: K22.70

## 2013-07-23 NOTE — ED Provider Notes (Signed)
CSN: [id number]     Arrival date & time 07/23/13  4098 History   First MD Initiated Contact with Patient 07/23/13 1011   This chart was scribed for Ephraim Hamburger, MD by Roxan Diesel, ED scribe.  This patient was seen in room APA06/APA06 and the patient's care was started at 10:23 AM.    Chief Complaint  Patient presents with  . Fall    The history is provided by the patient. No language interpreter was used.   HPI Comments: [name] is a 63 y.o. male with a history of arthritis and multiple back surgeries who presents to the Emergency Department complaining of a fall after he slipped on ice this morning around 7am. Pt reports that he fell on his back and hit his head. Pt complains of severe back pain on his lower left back pain and general pain all over his back. Pt rates his back pain a 7 out of 10. Pt further reports mild pain on the right side of his abdomen. Pt denies feeling pain when he breathes. Pt states that after falling he experienced pain in the area of impact of the head. However, pt states the head pain has been resolved.  He denies weakness or numbness in legs, neck pain, neck stiffness, or pain in arms or legs.  Pt reports that he has had multiple surgeries including several back surgeries. Pt is concerned about the hardware in his back and wants to ensure none of them were damaged by the fall.     Past Medical History  Diagnosis Date  . Sleep apnea     not tested  . Blood transfusion   . Shingles     10  . Hypertension   . Headache(784.0)   . Arthritis   . H/O hiatal hernia   . GERD (gastroesophageal reflux disease)     barretts esophagus  . PONV (postoperative nausea and vomiting)     trouble breathing after last surgery  . Tumor liver     no problems  . Cancer 2007    melanoma stage 3 stomach bx  . Barrett esophagus    Past Surgical History  Procedure Laterality Date  . Spinal cord stimulator implant      rt hip  . Melanoma excision      lft  abdomen  . Shoulder arthroscopy      rt bith defect , spur, rotator cuff  . Thyroid lobectomy      rt  . Leg surgery      x4 crushed   . Knee arthroplasty      lft  . Abdominal surgery      x3  . Chest surgery      lung incisional hernia for hernia  . Lithotripsy    . Hemorrhoid surgery      x2  . Joint replacement      lft knee  . Hernia repair    . Hiatal hernia repair    . Posterior cervical fusion/foraminotomy  08/18/2011    Procedure: POSTERIOR CERVICAL FUSION/FORAMINOTOMY LEVEL 3;  Surgeon: Ophelia Charter, MD;  Location: [facility];  Service: Neurosurgery;  Laterality: N/A;  Cervical three-four, four-five, five-six posterior cervical  fusion with instrumentation; Right cervical three-four, four-five laminotomy  . Back surgeries      x 9  . Back surgery      x8  . Neck surgery  2012  . Esophagogastroduodenoscopy (egd) with propofol N/A 03/28/2013    Procedure:  ESOPHAGOGASTRODUODENOSCOPY (EGD) WITH PROPOFOL;  Surgeon: Rogene Houston, MD;  Location: [facility];  Service: Endoscopy;  Laterality: N/A;  GE junction @ 38, proximal  margin @37   . Esophageal biopsy N/A 03/28/2013    Procedure: Distal Esophageal Biopsy;  Surgeon: Rogene Houston, MD;  Location: [facility];  Service: Endoscopy;  Laterality: N/A;   No family history on file. History  Substance Use Topics  . Smoking status: Former Smoker -- 5.00 packs/day    Types: Cigarettes    Quit date: 08/12/1980  . Smokeless tobacco: Former Systems developer     Comment: occ wine  . Alcohol Use: No    Review of Systems  Respiratory: Negative for shortness of breath.   Gastrointestinal: Positive for abdominal pain (Mild pain on right side of abd. ).  Musculoskeletal: Positive for back pain.  All other systems reviewed and are negative.      Allergies  Demerol  Home Medications   Current Outpatient Rx  Name  Route  Sig  Dispense  Refill  . calcium-vitamin D (OSCAL WITH D) 500-200 MG-UNIT per tablet   Oral   Take 1 tablet by  mouth 2 (two) times daily.          Marland Kitchen dexlansoprazole (DEXILANT) 60 MG capsule   Oral   Take 60 mg by mouth daily.         . diazepam (VALIUM) 10 MG tablet   Oral   Take 10 mg by mouth daily as needed. Muscle spasms         . fexofenadine (ALLEGRA) 180 MG tablet   Oral   Take 180 mg by mouth every evening.         . fish oil-omega-3 fatty acids 1000 MG capsule   Oral   Take 1 g by mouth 2 (two) times daily.         Marland Kitchen gabapentin (NEURONTIN) 600 MG tablet   Oral   Take 600 mg by mouth 3 (three) times daily.         Marland Kitchen lisinopril (PRINIVIL,ZESTRIL) 10 MG tablet   Oral   Take 10 mg by mouth daily.         . montelukast (SINGULAIR) 10 MG tablet   Oral   Take 10 mg by mouth daily.          . Multiple Vitamin (MULITIVITAMIN WITH MINERALS) TABS   Oral   Take 1 tablet by mouth daily.         . ondansetron (ZOFRAN) 4 MG tablet   Oral   Take 4 mg by mouth every 8 (eight) hours as needed for nausea.         Marland Kitchen oxyCODONE-acetaminophen (PERCOCET) 10-325 MG per tablet   Oral   Take 1 tablet by mouth every 4 (four) hours as needed for pain.   100 tablet   0   . promethazine (PHENERGAN) 25 MG tablet   Oral   Take 25 mg by mouth every 6 (six) hours as needed for nausea.         . rosuvastatin (CRESTOR) 10 MG tablet   Oral   Take 10 mg by mouth daily.          BP 124/79  Pulse 89  Temp(Src) 98.2 F (36.8 C) (Oral)  Resp 20  Ht 6' (1.829 m)  Wt 223 lb (101.152 kg)  BMI 30.24 kg/m2  SpO2 96% Physical Exam  Nursing note and vitals reviewed. Constitutional: He is oriented to person, place, and time.  He appears well-developed and well-nourished. No distress.  HENT:  Head: Normocephalic and atraumatic.  Eyes: EOM are normal.  Neck: Neck supple. No tracheal deviation present.  Cardiovascular: Normal rate, regular rhythm and normal heart sounds.   No murmur heard. Pulmonary/Chest: Effort normal and breath sounds normal. No respiratory distress.   Abdominal: There is no tenderness.  Musculoskeletal: Normal range of motion. He exhibits tenderness.  Left mid axillary chest wall tenderness. No ecchymosis No midline tenderness in back  Neurological: He is alert and oriented to person, place, and time. He has normal strength. No sensory deficit. Gait normal.  5/5 strength in all 4 extremities. Normal gait  Skin: Skin is warm and dry.  Psychiatric: He has a normal mood and affect. His behavior is normal.    ED Course  Procedures (including critical care time)  DIAGNOSTIC STUDIES: Oxygen Saturation is 96% on room air, adequate by my interpretation.    COORDINATION OF CARE: 10:28 AM-Discussed treatment plan which includes XRs of DG Lumbar Spine, DG Cervical Spine, DG Ribs, DG Thoracic Spine with pt at bedside and pt agreed to plan.   Labs Review Labs Reviewed - No data to display  Imaging Review Dg Ribs Bilateral W/chest  07/23/2013   CLINICAL DATA:  Fall.  EXAM: BILATERAL RIBS AND CHEST - 4+ VIEW  COMPARISON:  08/13/2011  FINDINGS: There has been prior hardware fixation of the cervical and upper thoracic spine. No fracture or other bone lesions are seen involving the ribs. There is no evidence of pneumothorax or pleural effusion. Both lungs are clear. The heart size appears mildly enlarged. No pleural effusion or edema identified. A spinal stimulator is identified within the mid thoracic spine.  IMPRESSION: No rib fractures identified.  No active cardiopulmonary abnormalities.   Electronically Signed   By: Kerby Moors M.D.   On: 07/23/2013 12:14   Dg Cervical Spine Complete  07/23/2013   CLINICAL DATA:  Fall.  Neck pain.  EXAM: CERVICAL SPINE  4+ VIEWS  COMPARISON:  09/12/2011  FINDINGS: There is straightening of the normal cervical lordosis. No listhesis is identified. Sequelae of prior posterior fusion are again identified from C3-C6. Prior upper thoracic posterior fusion is also again seen with old T4 compression fracture. There is  no evidence of acute cervical spine fracture. Dens appears intact. Prevertebral soft tissues are within normal limits.  IMPRESSION: No evidence of acute osseous abnormality in the cervical spine. Postoperative appearance as above.   Electronically Signed   By: Logan Bores   On: 07/23/2013 12:25   Dg Thoracic Spine 2 View  07/23/2013   CLINICAL DATA:  Back pain.  Fall.  EXAM: THORACIC SPINE - 2 VIEW  COMPARISON:  Chest radiographs 08/13/2011  FINDINGS: Spinal cord stimulator leads terminate at approximately T7. Prior upper thoracic posterior spinal fixation is again seen. Multiple surgical clips project in the lower mediastinum and at the GE junction. T4 compression fracture is again seen. There is no evidence of new thoracic spine compression fracture. Mild spondylosis is present in the mid to lower thoracic spine. Alignment appears normal. Lateral evaluation of the upper thoracic spine is included on separate cervical spine radiographs.  IMPRESSION: Postsurgical appearance of the thoracic spine with old compression fracture. No evidence of acute osseous abnormality.   Electronically Signed   By: Logan Bores   On: 07/23/2013 12:23   Dg Lumbar Spine Complete  07/23/2013   CLINICAL DATA:  Fall.  Back pain.  EXAM: LUMBAR SPINE - COMPLETE 4+ VIEW  COMPARISON:  06/09/2012  FINDINGS: Sequelae of prior L2-S1 posterior fusion are again identified. Interbody spacers are present at L2-3 and L3-4. There is no evidence of lumbar spine compression fracture. No definite listhesis is identified. Minimal anterior height loss at T11 is unchanged from 03/15/2013 CT. Minimal endplate spurring is noted at multiple levels. Extensive aortoiliac atherosclerotic calcification is present. Spinal cord stimulator is partially imaged.  IMPRESSION: Postsurgical changes in the lumbar spine as above. No evidence of acute osseous abnormality.   Electronically Signed   By: Logan Bores   On: 07/23/2013 12:29    EKG Interpretation    None       MDM   Final diagnoses:  Fall  Chest wall contusion    Patient declines wanting pain meds in either ED or at home. Has home pain meds but doesn't like to take. No obvious rib fractures. Patient very concerned his back hardware might've loosened, xrays are negative. No neuro sx and has normal neuro exam. No head trauma. Discussed it is important for patient to take deep breaths given his chest trauma to prevent atelectasis/PNA. Still would prefer no pain meds, and seems to not be splinting at the moment. Will give an incentive spirometer and encourage close f/u.  I personally performed the services described in this documentation, which was scribed in my presence. The recorded information has been reviewed and is accurate.   Ephraim Hamburger, MD 07/23/13 [phone]

## 2013-07-23 NOTE — ED Notes (Signed)
Pt states that he had went outside this am, slipped on some ice, fell hitting his head and lower back on the ice, has hx of back surgeries with hardware implanted and is concerned that he may have injured his back. Arrives to er ambulatory to tx room, c/o lower back pain worse on left side than right, denies any radiation of pain.

## 2013-07-23 NOTE — Discharge Instructions (Signed)
Blunt Chest Trauma  Blunt chest trauma is an injury caused by a blow to the chest. These chest injuries can be very painful. Blunt chest trauma often results in bruised or broken (fractured) ribs. Most cases of bruised and fractured ribs from blunt chest traumas get better after 1 to 3 weeks of rest and pain medicine. Often, the soft tissue in the chest wall is also injured, causing pain and bruising. Internal organs, such as the heart and lungs, may also be injured. Blunt chest trauma can lead to serious medical problems. This injury requires immediate medical care.  CAUSES   · Motor vehicle collisions.  · Falls.  · Physical violence.  · Sports injuries.  SYMPTOMS   · Chest pain. The pain may be worse when you move or breathe deeply.  · Shortness of breath.  · Lightheadedness.  · Bruising.  · Tenderness.  · Swelling.  DIAGNOSIS   Your caregiver will do a physical exam. X-rays may be taken to look for fractures. However, minor rib fractures may not show up on X-rays until a few days after the injury. If a more serious injury is suspected, further imaging tests may be done. This may include ultrasounds, computed tomography (CT) scans, or magnetic resonance imaging (MRI).  TREATMENT   Treatment depends on the severity of your injury. Your caregiver may prescribe pain medicines and deep breathing exercises.  HOME CARE INSTRUCTIONS  · Limit your activities until you can move around without much pain.  · Do not do any strenuous work until your injury is healed.  · Put ice on the injured area.  · Put ice in a plastic bag.  · Place a towel between your skin and the bag.  · Leave the ice on for 15-20 minutes, 03-04 times a day.  · You may wear a rib belt as directed by your caregiver to reduce pain.  · Practice deep breathing as directed by your caregiver to keep your lungs clear.  · Only take over-the-counter or prescription medicines for pain, fever, or discomfort as directed by your caregiver.  SEEK IMMEDIATE MEDICAL  CARE IF:   · You have increasing pain or shortness of breath.  · You cough up blood.  · You have nausea, vomiting, or abdominal pain.  · You have a fever.  · You feel dizzy, weak, or you faint.  MAKE SURE YOU:  · Understand these instructions.  · Will watch your condition.  · Will get help right away if you are not doing well or get worse.  Document Released: 06/26/2004 Document Revised: 08/11/2011 Document Reviewed: 03/05/2011  [employer]® Patient Information ©2014 [employer].

## 2013-10-26 ENCOUNTER — Encounter (INDEPENDENT_AMBULATORY_CARE_PROVIDER_SITE_OTHER): Payer: Self-pay | Admitting: *Deleted

## 2013-11-18 ENCOUNTER — Ambulatory Visit (INDEPENDENT_AMBULATORY_CARE_PROVIDER_SITE_OTHER): Payer: Medicare Other | Admitting: Pulmonary Disease

## 2013-11-18 ENCOUNTER — Encounter: Payer: Self-pay | Admitting: Pulmonary Disease

## 2013-11-18 VITALS — BP 120/86 | HR 81 | Temp 98.1°F | Ht 72.0 in | Wt 225.8 lb

## 2013-11-18 DIAGNOSIS — G4733 Obstructive sleep apnea (adult) (pediatric): Secondary | ICD-10-CM

## 2013-11-18 NOTE — Assessment & Plan Note (Signed)
The patient's history is very suspicious for clinically significant sleep disordered breathing. I have had a long discussion with him about the pathophysiology of sleep apnea, including its impact to his quality of life and cardiovascular health. He will need to have a sleep study for diagnosis, and the patient is agreeable

## 2013-11-18 NOTE — Progress Notes (Signed)
   Subjective:    Patient ID: [name], male    DOB: 1950-07-08, 63 y.o.   MRN: [id number]  HPI The patient is a 63 year old male who I've been asked to see for possible obstructive sleep apnea. He has been noted to have loud snoring, as well as an abnormal breathing pattern during sleep. He also describes classic gasping arousals. He has frequent awakenings at night, and is not rested in the mornings upon arising. He notes definite sleepiness during the day with inactivity, and will get sleepy in the evenings watching movies or television.  He has some sleep pressure driving longer distances. His weight has not changed over the last 2 years, and his Epworth score today is 6.   Sleep Questionnaire What time do you typically go to bed?( Between what hours) 11-12 11-12 at 1050 on 11/18/13 by Lilli Few, CMA How long does it take you to fall asleep? 3 hours 3 hours at 1050 on 11/18/13 by Lilli Few, CMA How many times during the night do you wake up? 5 5 at 1050 on 11/18/13 by Lilli Few, CMA What time do you get out of bed to start your day? 0600 0600 at 1050 on 11/18/13 by Lilli Few, CMA Do you drive or operate heavy machinery in your occupation? No No at 1050 on 11/18/13 by Lilli Few, CMA How much has your weight changed (up or down) over the past two years? (In pounds) 0 oz (0 kg) 0 oz (0 kg) at 1050 on 11/18/13 by Lilli Few, CMA Have you ever had a sleep study before? No No at 1050 on 11/18/13 by Lilli Few, CMA Do you currently use CPAP? No No at 1050 on 11/18/13 by Lilli Few, CMA Do you wear oxygen at any time? No No at 1050 on 11/18/13 by Lilli Few, CMA   Review of Systems  Constitutional: Negative for fever and unexpected weight change.  HENT: Positive for congestion. Negative for dental problem, ear pain, nosebleeds, postnasal drip, rhinorrhea, sinus pressure, sneezing, sore  throat and trouble swallowing.   Eyes: Negative for redness and itching.  Respiratory: Positive for shortness of breath. Negative for cough, chest tightness and wheezing.   Cardiovascular: Positive for chest pain. Negative for palpitations and leg swelling.  Gastrointestinal: Negative for nausea and vomiting.  Genitourinary: Negative for dysuria.  Musculoskeletal: Negative for joint swelling.  Skin: Negative for rash.  Neurological: Positive for headaches.  Hematological: Does not bruise/bleed easily.  Psychiatric/Behavioral: Negative for dysphoric mood. The patient is not nervous/anxious.        Objective:   Physical Exam Constitutional:  Overweight male, no acute distress  HENT:  Nares patent without discharge  Oropharynx without exudate, palate and uvula are mildly elongated.  Eyes:  Perrla, eomi, no scleral icterus  Neck:  No JVD, no TMG  Cardiovascular:  Normal rate, regular rhythm, no rubs or gallops.  No murmurs        Intact distal pulses  Pulmonary :  Normal breath sounds, no stridor or respiratory distress   No rales, rhonchi, or wheezing  Abdominal:  Soft, nondistended, bowel sounds present.  No tenderness noted.   Musculoskeletal:  No lower extremity edema noted.  Lymph Nodes:  No cervical lymphadenopathy noted  Skin:  No cyanosis noted  Neurologic:  Alert, appropriate, moves all 4 extremities without obvious deficit.         Assessment & Plan:

## 2013-11-18 NOTE — Patient Instructions (Signed)
Will schedule for a sleep study, and arrange followup once the results are available.  

## 2013-11-28 ENCOUNTER — Ambulatory Visit (INDEPENDENT_AMBULATORY_CARE_PROVIDER_SITE_OTHER): Payer: Medicare Other | Admitting: Internal Medicine

## 2013-11-28 ENCOUNTER — Encounter (INDEPENDENT_AMBULATORY_CARE_PROVIDER_SITE_OTHER): Payer: Self-pay | Admitting: Internal Medicine

## 2013-11-28 VITALS — BP 124/72 | HR 80 | Temp 98.0°F | Ht 72.0 in | Wt 226.1 lb

## 2013-11-28 DIAGNOSIS — K2270 Barrett's esophagus without dysplasia: Secondary | ICD-10-CM

## 2013-11-28 DIAGNOSIS — K769 Liver disease, unspecified: Secondary | ICD-10-CM

## 2013-11-28 DIAGNOSIS — K219 Gastro-esophageal reflux disease without esophagitis: Secondary | ICD-10-CM

## 2013-11-28 DIAGNOSIS — K7689 Other specified diseases of liver: Secondary | ICD-10-CM

## 2013-11-28 NOTE — Patient Instructions (Signed)
CT abdomen/pelvis with CM. OV in 6 months.

## 2013-11-28 NOTE — Progress Notes (Addendum)
Subjective:     Patient ID: [name], male   DOB: Oct 13, 1950, 63 y.o.   MRN: [id number]  HPI He tells me he doing okay. He does have trouble sleeping. He is being evaluated for sleep apnea. His appetite. No weight loss. He is eating 3 times a day and the meals are small. No abdominal pain. Usually has a BM daily.  No melena of bright red rectal bleeding He exercises on the stationary bike as much as he can.  He occasionally has acid reflux.   Seen at [facility] Dr. Prentice Docker MD    07/21/2013 ALT 24. Ct Angiogram Chest w/wo CM 09/16/2013 Stable 4.6 cm ascending thoracic aortic aneurysm. No evidence of aneurysm leak or rupture. No evidecfe of aortic dissection. Stable small hiatal hernia.  Chest Xray: chest pain: No acute cardiopulmonary abnormality. No definite acute bony abnormality is demonstrate either. 09/08/2013 Cardiac Cath: Non obstructive CAD. Heavy calcification of proximal vessels. LV gram not done due to VF. Patient with VF with last RCA injection. Counter-shocked x 1 to restore NSR. Transthoracic Echo Study: Left ventricular normal in size. EF 53%. Exercise Myocardial perfusion Test 08/08/2013: Negative Exercise Perfusion stress test. Carotid Duplex (Complete): Suggests non-hemodynamically significant ICA stenoses bilaterally, 1-19%. Minimal plaque identified.   Hx of Hepatic mass. Last CT was in September of 2014. IMPRESSION:   ADDENDUM:  Comparison is now made with a previous contrast enhanced abdomen CT  from [facility] on 10/01/2005. This shows that the 3.7 cm  lesion in the anterior right hepatic lobe seen on today's study has  been stable. The prior study also shows this lesion has a  characteristic enhancement pattern of a benign hemangioma. No  further imaging characterization by MRI is necessary. No lesions  concerning for metastatic disease are identified. Dr. Weber Cooks  discussed this by telephone with Dr. Olevia Perches office  staff.      04/05/2013 Gastric emptying study;  Notes Recorded by Rogene Houston, MD on 04/16/2013 at 10:19 AM Gastric emptying study results reviewed with patient. He has mild gastroparesis. For now he will be on dietary measures. Office visit in 6 months.    03/28/2013 EGD: Procedure: EGD  Indications: Patient is 63 year old Caucasian male who has chronic GERD complicated by short segment Barrett's esophagus. He's had three anti-reflux surgeries but with limited success. He denies dysphagia.   Impression:  No evidence of erosive esophagitis.  Loose fundal wrap.  Short segment Barrett's esophagus. Biopsy taken for routine histology.  Few small hyperplastic-appearing polyps the gastric body and these were left alone.  Scant amount of food debris in the stomach with patent pylorus.   Notes Recorded by Rogene Houston, MD on 04/03/2013 at 7:37 PM Biopsy negative for Barrett's esophagus. Reconsider next EGD in 5 years.   CMP     Component Value Date/Time   NA 137 03/22/2013 1100   K 4.2 03/22/2013 1100   CL 100 03/22/2013 1100   CO2 29 03/22/2013 1100   GLUCOSE 87 03/22/2013 1100   BUN 16 03/22/2013 1100   CREATININE 0.96 03/22/2013 1100   CREATININE 0.94 03/01/2013 1503   CALCIUM 9.4 03/22/2013 1100   PROT 6.8 03/01/2013 1503   ALBUMIN 4.4 03/01/2013 1503   AST 26 03/01/2013 1503   ALT 22 03/01/2013 1503   ALKPHOS 55 03/01/2013 1503   BILITOT 0.6 03/01/2013 1503   GFRNONAA 88* 03/22/2013 1100   GFRAA >90 03/22/2013 1100      Review of  Systems Past Medical History  Diagnosis Date  . Sleep apnea     not tested  . Blood transfusion   . Shingles     10  . Hypertension   . Headache(784.0)   . Arthritis   . H/O hiatal hernia   . GERD (gastroesophageal reflux disease)     barretts esophagus  . PONV (postoperative nausea and vomiting)     trouble breathing after last surgery  . Tumor liver     no problems  . Cancer 2007    melanoma stage 3 stomach bx  . Barrett  esophagus     Past Surgical History  Procedure Laterality Date  . Spinal cord stimulator implant      rt hip  . Melanoma excision      lft abdomen  . Shoulder arthroscopy      rt bith defect , spur, rotator cuff  . Thyroid lobectomy      rt  . Leg surgery      x4 crushed   . Knee arthroplasty      lft  . Abdominal surgery      x3  . Chest surgery      lung incisional hernia for hernia  . Lithotripsy    . Hemorrhoid surgery      x2  . Joint replacement      lft knee  . Hernia repair    . Hiatal hernia repair    . Posterior cervical fusion/foraminotomy  08/18/2011    Procedure: POSTERIOR CERVICAL FUSION/FORAMINOTOMY LEVEL 3;  Surgeon: Ophelia Charter, MD;  Location: [place] [facility];  Service: Neurosurgery;  Laterality: N/A;  Cervical three-four, four-five, five-six posterior cervical  fusion with instrumentation; Right cervical three-four, four-five laminotomy  . Back surgeries      x 9  . Back surgery      x8  . Neck surgery  2012  . Esophagogastroduodenoscopy (egd) with propofol N/A 03/28/2013    Procedure: ESOPHAGOGASTRODUODENOSCOPY (EGD) WITH PROPOFOL;  Surgeon: Rogene Houston, MD;  Location: [facility];  Service: Endoscopy;  Laterality: N/A;  GE junction @ 38, proximal  margin @37   . Esophageal biopsy N/A 03/28/2013    Procedure: Distal Esophageal Biopsy;  Surgeon: Rogene Houston, MD;  Location: [facility];  Service: Endoscopy;  Laterality: N/A;    Allergies  Allergen Reactions  . Demerol Other (See Comments)    MAKES ME CRAZY!!    Current Outpatient Prescriptions on File Prior to Visit  Medication Sig Dispense Refill  . calcium-vitamin D (OSCAL WITH D) 500-200 MG-UNIT per tablet Take 1 tablet by mouth 2 (two) times daily.       Marland Kitchen dexlansoprazole (DEXILANT) 60 MG capsule Take 60 mg by mouth daily.      . diazepam (VALIUM) 10 MG tablet Take 10 mg by mouth daily as needed. Muscle spasms      . fexofenadine (ALLEGRA) 180 MG tablet Take 180 mg by mouth every evening.       . fish oil-omega-3 fatty acids 1000 MG capsule Take 1 g by mouth 2 (two) times daily.      Marland Kitchen lisinopril (PRINIVIL,ZESTRIL) 10 MG tablet Take 10 mg by mouth daily.      . montelukast (SINGULAIR) 10 MG tablet Take 10 mg by mouth daily.       . Multiple Vitamin (MULITIVITAMIN WITH MINERALS) TABS Take 1 tablet by mouth daily.      Marland Kitchen oxyCODONE-acetaminophen (PERCOCET) 10-325 MG per tablet Take 1 tablet by mouth  every 4 (four) hours as needed for pain.  100 tablet  0  . rosuvastatin (CRESTOR) 10 MG tablet Take 10 mg by mouth daily.      Marland Kitchen gabapentin (NEURONTIN) 600 MG tablet Take 600 mg by mouth 3 (three) times daily.       No current facility-administered medications on file prior to visit.        Objective:   Physical Exam  Filed Vitals:   11/28/13 0917  BP: 124/72  Pulse: 80  Temp: 98 F (36.7 C)  Height: 6' (1.829 m)  Weight: 226 lb 1.6 oz (102.558 kg)   Alert and oriented. Skin warm and dry. Oral mucosa is moist.   . Sclera anicteric, conjunctivae is pink. Thyroid not enlarged. No cervical lymphadenopathy. Lungs clear. Heart regular rate and rhythm.  Abdomen is soft. Bowel sounds are positive. No hepatomegaly. No abdominal masses felt. No tenderness.  No edema to lower extremities. Patient is alert and oriented.     Assessment:    Barrett's esophagus. Occasionally has acid reflux. Presently taking Dexilant. Hepatic lesion stable.     Plan:     Patient will get recent blood work from [employer] point.  OV in 6 months. Repeat CT abdomen/pelvis with CM for hepatic lesion.

## 2013-11-29 ENCOUNTER — Ambulatory Visit: Payer: Medicare Other

## 2013-12-05 ENCOUNTER — Ambulatory Visit: Payer: Medicare Other | Attending: Pulmonary Disease | Admitting: Sleep Medicine

## 2013-12-05 VITALS — Ht 72.0 in | Wt 225.0 lb

## 2013-12-05 DIAGNOSIS — G4733 Obstructive sleep apnea (adult) (pediatric): Secondary | ICD-10-CM | POA: Insufficient documentation

## 2013-12-08 ENCOUNTER — Encounter (INDEPENDENT_AMBULATORY_CARE_PROVIDER_SITE_OTHER): Payer: Self-pay | Admitting: *Deleted

## 2013-12-08 NOTE — Telephone Encounter (Addendum)
Patient's insurance denied PA for CT abd/pelvis -- please advise what to do now This encounter was created in error - please disregard.

## 2013-12-16 ENCOUNTER — Telehealth: Payer: Self-pay | Admitting: Pulmonary Disease

## 2013-12-16 NOTE — Telephone Encounter (Signed)
Spoke with the pt's spouse  She is requesting sleep study results  I advised that once [facility] reviews the study we will call them to set up ov to discuss  She verbalized understanding  Nothing further needed

## 2013-12-20 ENCOUNTER — Encounter (HOSPITAL_COMMUNITY): Payer: Self-pay

## 2013-12-20 ENCOUNTER — Ambulatory Visit (HOSPITAL_COMMUNITY)
Admission: RE | Admit: 2013-12-20 | Discharge: 2013-12-20 | Disposition: A | Discharge status: Home / Self Care | Payer: Medicare Other | Source: Ambulatory Visit | Attending: Internal Medicine | Admitting: Internal Medicine

## 2013-12-20 DIAGNOSIS — K449 Diaphragmatic hernia without obstruction or gangrene: Secondary | ICD-10-CM | POA: Insufficient documentation

## 2013-12-20 DIAGNOSIS — K769 Liver disease, unspecified: Secondary | ICD-10-CM

## 2013-12-20 DIAGNOSIS — Z981 Arthrodesis status: Secondary | ICD-10-CM | POA: Insufficient documentation

## 2013-12-20 DIAGNOSIS — K5730 Diverticulosis of large intestine without perforation or abscess without bleeding: Secondary | ICD-10-CM | POA: Insufficient documentation

## 2013-12-20 DIAGNOSIS — K7689 Other specified diseases of liver: Secondary | ICD-10-CM | POA: Insufficient documentation

## 2013-12-20 MED ORDER — IOHEXOL 300 MG/ML  SOLN
100.0000 mL | Freq: Once | INTRAMUSCULAR | Status: AC | PRN
  Administered 2013-12-20: 100 mL via INTRAVENOUS

## 2013-12-21 ENCOUNTER — Telehealth (INDEPENDENT_AMBULATORY_CARE_PROVIDER_SITE_OTHER): Payer: Self-pay | Admitting: *Deleted

## 2013-12-21 NOTE — Telephone Encounter (Signed)
I have spoken with patient 

## 2013-12-21 NOTE — Telephone Encounter (Signed)
Returning call--got a call yesterday about his results on his CT scan.  Please call him back at [phone] or [phone]  Thanks

## 2013-12-23 ENCOUNTER — Telehealth: Payer: Self-pay | Admitting: Pulmonary Disease

## 2013-12-23 DIAGNOSIS — G4710 Hypersomnia, unspecified: Secondary | ICD-10-CM

## 2013-12-23 DIAGNOSIS — G4730 Sleep apnea, unspecified: Secondary | ICD-10-CM

## 2013-12-23 NOTE — Telephone Encounter (Signed)
appt set on 01-10-14. [place] Bing, CMA

## 2013-12-23 NOTE — Sleep Study (Signed)
   NAME: [name] DATE OF BIRTH:  30-Dec-1950 MEDICAL RECORD NUMBER [id number]  LOCATION: [facility]  PHYSICIAN: Kathee Delton  DATE OF STUDY: 12/05/2013  SLEEP STUDY TYPE: Nocturnal Polysomnogram               REFERRING PHYSICIAN: Windi Toro, Armando Reichert, MD  INDICATION FOR STUDY: Hypersomnia with sleep apnea  EPWORTH SLEEPINESS SCORE:  18 HEIGHT: 6' (182.9 cm)  WEIGHT: 225 lb (102.059 kg)    Body mass index is 30.51 kg/(m^2).  NECK SIZE:   in.  MEDICATIONS: Reviewed in the sleep record  SLEEP ARCHITECTURE: The patient had a total sleep time of 279 minutes with adequate slow-wave sleep for age and decreased quantity of REM. Sleep onset latency was normal at 11 minutes, and REM onset was very prolonged at 284 minutes.  Sleep efficiency was moderately reduced at 71%.  RESPIRATORY DATA: The patient was found to have 36 apneas and 115 obstructive hypopneas, giving him an AHI of 33 events per hour. The events occurred in all body positions, and there was loud snoring noted throughout.  OXYGEN DATA: There was transient oxygen desaturation as low as 78% with the patient's obstructive events  CARDIAC DATA: Intermittent bradycardia noted at times with rare PAC.  MOVEMENT/PARASOMNIA: No significant limb movements or other abnormal behaviors were seen.  IMPRESSION/ RECOMMENDATION:    1) moderate obstructive sleep apnea/hypopnea syndrome, with an AHI of 33 events per hour and oxygen desaturation as low as 78%. Treatment for this degree of sleep apnea can include a trial of weight loss alone, upper airway surgery, dental appliance, and also CPAP. Clinical correlation is suggested.  2) intermittent bradycardia noted at times during the night, along with rare PAC.     Kathee Delton Diplomate, American Board of Sleep Medicine  ELECTRONICALLY SIGNED ON:  12/23/2013, 2:33 PM [place] PH: [phone]   FX: [phone] [place]

## 2013-12-23 NOTE — Telephone Encounter (Signed)
Pt needs ov to review sleep study results.  

## 2014-01-10 ENCOUNTER — Ambulatory Visit (INDEPENDENT_AMBULATORY_CARE_PROVIDER_SITE_OTHER): Payer: Medicare Other | Admitting: Pulmonary Disease

## 2014-01-10 ENCOUNTER — Encounter: Payer: Self-pay | Admitting: Pulmonary Disease

## 2014-01-10 VITALS — BP 118/78 | HR 75 | Temp 98.4°F | Ht 72.0 in | Wt 225.0 lb

## 2014-01-10 DIAGNOSIS — G4733 Obstructive sleep apnea (adult) (pediatric): Secondary | ICD-10-CM

## 2014-01-10 DIAGNOSIS — M549 Dorsalgia, unspecified: Secondary | ICD-10-CM

## 2014-01-10 NOTE — Progress Notes (Signed)
   Subjective:    Patient ID: [name], male    DOB: 12/24/1950, 63 y.o.   MRN: [id number]  HPI Patient comes in today for followup of his recent sleep study. He was found to have moderate OSA, with an AHI of 33 events per hour and oxygen desaturation as low as 78%. I have reviewed the study with him in detail, and answered all of his questions.   Review of Systems  Constitutional: Negative for fever and unexpected weight change.  HENT: Negative for congestion, dental problem, ear pain, nosebleeds, postnasal drip, rhinorrhea, sinus pressure, sneezing, sore throat and trouble swallowing.   Eyes: Negative for redness and itching.  Respiratory: Negative for cough, chest tightness, shortness of breath and wheezing.   Cardiovascular: Negative for palpitations and leg swelling.  Gastrointestinal: Negative for nausea and vomiting.  Genitourinary: Negative for dysuria.  Musculoskeletal: Negative for joint swelling.  Skin: Negative for rash.  Neurological: Negative for headaches.  Hematological: Does not bruise/bleed easily.  Psychiatric/Behavioral: Negative for dysphoric mood. The patient is not nervous/anxious.        Objective:   Physical Exam Overweight male in no acute distress Nose without purulence or discharge noted Neck without lymphadenopathy or thyromegaly Lower extremities without edema, no cyanosis Alert and oriented, moves all 4 extremities.       Assessment & Plan:

## 2014-01-10 NOTE — Assessment & Plan Note (Signed)
The patient has moderate obstructive sleep apnea by his recent study, and is significantly symptomatic with underlying cardiovascular disease. I have recommended treating this aggressively with CPAP while he is working on weight loss. The patient is agreeable to this approach. I will set the patient up on cpap at a moderate pressure level to allow for desensitization, and will troubleshoot the device over the next 4-6weeks if needed.  The pt is to call me if having issues with tolerance.  Will then optimize the pressure once patient is able to wear cpap on a consistent basis.

## 2014-01-10 NOTE — Patient Instructions (Signed)
Will start on cpap at a moderate pressure level.  Please call if having tolerance issues.  Work on weight loss followup with me again in 8 weeks.  

## 2014-01-16 ENCOUNTER — Telehealth: Payer: Self-pay | Admitting: Pulmonary Disease

## 2014-01-16 DIAGNOSIS — G4733 Obstructive sleep apnea (adult) (pediatric): Secondary | ICD-10-CM

## 2014-01-16 NOTE — Telephone Encounter (Signed)
Order placed and pt aware. Nothing further needed

## 2014-01-16 NOTE — Telephone Encounter (Signed)
Why don't we try 7-20 first, then let us know if he is still not doing well.

## 2014-01-16 NOTE — Telephone Encounter (Signed)
Pt wants to know if cpap pressure can be increased to maybe 10 - 20.  He is currently on 5-12.  He states that he cannot fall asleep with the 5 but is able to fall asleep when pressure goes higher but still wakes up after a few hours and cant go back to sleep.  He states that it is not due to the mask or machine because he is tolerating it fine.  He just thinks the pressure needs to be increased.  He states that he is feeling for refreshed in the mornings.  Please advise.

## 2014-03-15 ENCOUNTER — Encounter (INDEPENDENT_AMBULATORY_CARE_PROVIDER_SITE_OTHER): Payer: Self-pay

## 2014-03-15 ENCOUNTER — Encounter: Payer: Self-pay | Admitting: Pulmonary Disease

## 2014-03-15 ENCOUNTER — Ambulatory Visit (INDEPENDENT_AMBULATORY_CARE_PROVIDER_SITE_OTHER): Payer: Medicare Other | Admitting: Pulmonary Disease

## 2014-03-15 VITALS — BP 108/60 | HR 66 | Temp 97.4°F | Ht 72.0 in | Wt 236.0 lb

## 2014-03-15 DIAGNOSIS — Z23 Encounter for immunization: Secondary | ICD-10-CM

## 2014-03-15 DIAGNOSIS — G4733 Obstructive sleep apnea (adult) (pediatric): Secondary | ICD-10-CM

## 2014-03-15 NOTE — Progress Notes (Signed)
   Subjective:    Patient ID: [name], male    DOB: [birth year], 63 y.o.   MRN: [id number]  HPI The patient comes in today for followup of his obstructive sleep apnea. He is wearing CPAP compliantly by his download, and has excellent control of his AHI and no significant mask leak. 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 However, he feels that he sleeps fantastic with the CPAP, and is rested in the morning upon arising. He denies any issues with sleepiness during the day. I have reviewed his sleep hygiene with him, and there are no specific issues of significance   Review of Systems  Constitutional: Negative for fever and unexpected weight change.  HENT: Negative for congestion, dental problem, ear pain, nosebleeds, postnasal drip, rhinorrhea, sinus pressure, sneezing, sore throat and trouble swallowing.   Eyes: Negative for redness and itching.  Respiratory: Negative for cough, chest tightness, shortness of breath and wheezing.   Cardiovascular: Negative for palpitations and leg swelling.  Gastrointestinal: Negative for nausea and vomiting.  Genitourinary: Negative for dysuria.  Musculoskeletal: Negative for joint swelling.  Skin: Negative for rash.  Neurological: Negative for headaches.  Hematological: Does not bruise/bleed easily.  Psychiatric/Behavioral: Negative for dysphoric mood. The patient is not nervous/anxious.        Objective:   Physical Exam Well-developed male in no acute distress Nose without purulence or discharge noted No skin breakdown or pressure necrosis from the CPAP Neck without lymphadenopathy or thyromegaly Lower extremities without edema, no cyanosis Alert and oriented, moves all 4 extremities       Assessment & Plan:

## 2014-03-15 NOTE — Patient Instructions (Signed)
Continue on cpap.  Your download looks great. Try the behavioral therapies we discussed to see if it helps with sleep onset.  The most important thing is to avoid staying in bed if you are not sleeping Call your prior psychologist and see if they do CBT for insomnia (cognitive behavioral therapy) If doing well, followup with me again in one year.

## 2014-03-15 NOTE — Assessment & Plan Note (Signed)
The patient is doing very well with CPAP, and his download shows great compliance and good control of his AHI. 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 He has tried various sleep medications without success. He really does not want to do anything else from a pharmaceutical standpoint. I have had a long discussion with him about stimulus control therapy as well his sleep restriction therapy. I have also reviewed good sleep hygiene. The patient is going to try this over the next 4-8 weeks and see how he responds. If he continues to have issues, he will be best served by referral to behavioral therapy for CBT (cognitive behavioral therapy)

## 2014-04-28 ENCOUNTER — Encounter (HOSPITAL_COMMUNITY): Payer: Self-pay | Admitting: *Deleted

## 2014-04-28 ENCOUNTER — Emergency Department (HOSPITAL_COMMUNITY)
Admission: EM | Admit: 2014-04-28 | Discharge: 2014-04-28 | Disposition: A | Discharge status: Home / Self Care | Payer: Medicare Other | Attending: Emergency Medicine | Admitting: Emergency Medicine

## 2014-04-28 ENCOUNTER — Emergency Department (HOSPITAL_COMMUNITY): Payer: Medicare Other

## 2014-04-28 DIAGNOSIS — I10 Essential (primary) hypertension: Secondary | ICD-10-CM | POA: Diagnosis not present

## 2014-04-28 DIAGNOSIS — M549 Dorsalgia, unspecified: Secondary | ICD-10-CM

## 2014-04-28 DIAGNOSIS — K219 Gastro-esophageal reflux disease without esophagitis: Secondary | ICD-10-CM | POA: Diagnosis not present

## 2014-04-28 DIAGNOSIS — G8929 Other chronic pain: Secondary | ICD-10-CM | POA: Insufficient documentation

## 2014-04-28 DIAGNOSIS — Z8619 Personal history of other infectious and parasitic diseases: Secondary | ICD-10-CM | POA: Diagnosis not present

## 2014-04-28 DIAGNOSIS — Z87891 Personal history of nicotine dependence: Secondary | ICD-10-CM | POA: Insufficient documentation

## 2014-04-28 DIAGNOSIS — M545 Low back pain: Secondary | ICD-10-CM | POA: Diagnosis not present

## 2014-04-28 DIAGNOSIS — Z79899 Other long term (current) drug therapy: Secondary | ICD-10-CM | POA: Diagnosis not present

## 2014-04-28 DIAGNOSIS — Z85828 Personal history of other malignant neoplasm of skin: Secondary | ICD-10-CM | POA: Diagnosis not present

## 2014-04-28 MED ORDER — ONDANSETRON 4 MG PO TBDP
4.0000 mg | ORAL_TABLET | Freq: Once | ORAL | Status: AC
  Administered 2014-04-28: 4 mg via ORAL

## 2014-04-28 MED ORDER — CYCLOBENZAPRINE HCL 10 MG PO TABS
10.0000 mg | ORAL_TABLET | Freq: Once | ORAL | Status: AC
  Administered 2014-04-28: 10 mg via ORAL
  Filled 2014-04-28: qty 1

## 2014-04-28 MED ORDER — HYDROMORPHONE HCL 1 MG/ML IJ SOLN
1.0000 mg | Freq: Once | INTRAMUSCULAR | Status: AC
  Administered 2014-04-28: 1 mg via INTRAMUSCULAR
  Filled 2014-04-28: qty 1

## 2014-04-28 MED ORDER — PROMETHAZINE HCL 25 MG RE SUPP
25.0000 mg | Freq: Once | RECTAL | Status: DC
  Filled 2014-04-28: qty 1

## 2014-04-28 MED ORDER — OXYCODONE-ACETAMINOPHEN 5-325 MG PO TABS
2.0000 | ORAL_TABLET | Freq: Once | ORAL | Status: AC
  Administered 2014-04-28: 2 via ORAL
  Filled 2014-04-28: qty 2

## 2014-04-28 NOTE — ED Notes (Signed)
Gave pt [name] and ginger ale, okay per Dr. Mingo Amber.

## 2014-04-28 NOTE — ED Notes (Signed)
Pt was feeling hot making him feel nauseous. Gave pt two ice packs to place under arms and wet wash cloths to wipe himself down with.

## 2014-04-28 NOTE — Discharge Instructions (Signed)
Back Pain, Adult Low back pain is very common. About 1 in 5 people have back pain.The cause of low back pain is rarely dangerous. The pain often gets better over time.About half of people with a sudden onset of back pain feel better in just 2 weeks. About 8 in 10 people feel better by 6 weeks.  CAUSES Some common causes of back pain include:  Strain of the muscles or ligaments supporting the spine.  Wear and tear (degeneration) of the spinal discs.  Arthritis.  Direct injury to the back. DIAGNOSIS Most of the time, the direct cause of low back pain is not known.However, back pain can be treated effectively even when the exact cause of the pain is unknown.Answering your caregiver's questions about your overall health and symptoms is one of the most accurate ways to make sure the cause of your pain is not dangerous. If your caregiver needs more information, he or she may order lab work or imaging tests (X-rays or MRIs).However, even if imaging tests show changes in your back, this usually does not require surgery. HOME CARE INSTRUCTIONS For many people, back pain returns.Since low back pain is rarely dangerous, it is often a condition that people can learn to manageon their own.   Remain active. It is stressful on the back to sit or stand in one place. Do not sit, drive, or stand in one place for more than 30 minutes at a time. Take short walks on level surfaces as soon as pain allows.Try to increase the length of time you walk each day.  Do not stay in bed.Resting more than 1 or 2 days can delay your recovery.  Do not avoid exercise or work.Your body is made to move.It is not dangerous to be active, even though your back may hurt.Your back will likely heal faster if you return to being active before your pain is gone.  Pay attention to your body when you bend and lift. Many people have less discomfortwhen lifting if they bend their knees, keep the load close to their bodies,and  avoid twisting. Often, the most comfortable positions are those that put less stress on your recovering back.  Find a comfortable position to sleep. Use a firm mattress and lie on your side with your knees slightly bent. If you lie on your back, put a pillow under your knees.  Only take over-the-counter or prescription medicines as directed by your caregiver. Over-the-counter medicines to reduce pain and inflammation are often the most helpful.Your caregiver may prescribe muscle relaxant drugs.These medicines help dull your pain so you can more quickly return to your normal activities and healthy exercise.  Put ice on the injured area.  Put ice in a plastic bag.  Place a towel between your skin and the bag.  Leave the ice on for 15-20 minutes, 03-04 times a day for the first 2 to 3 days. After that, ice and heat may be alternated to reduce pain and spasms.  Ask your caregiver about trying back exercises and gentle massage. This may be of some benefit.  Avoid feeling anxious or stressed.Stress increases muscle tension and can worsen back pain.It is important to recognize when you are anxious or stressed and learn ways to manage it.Exercise is a great option. SEEK MEDICAL CARE IF:  You have pain that is not relieved with rest or medicine.  You have pain that does not improve in 1 week.  You have new symptoms.  You are generally not feeling well. SEEK   IMMEDIATE MEDICAL CARE IF:   You have pain that radiates from your back into your legs.  You develop new bowel or bladder control problems.  You have unusual weakness or numbness in your arms or legs.  You develop nausea or vomiting.  You develop abdominal pain.  You feel faint. Document Released: 05/19/2005 Document Revised: 11/18/2011 Document Reviewed: 09/20/2013 Exit[employer] Patient Information 2015 [employer]. This information is not intended to replace advice given to you by your health care provider. Make sure you  discuss any questions you have with your health care provider.  

## 2014-04-28 NOTE — ED Notes (Signed)
thge pt has chronic back pain and fior the past 4 days his pain has incrrased

## 2014-04-28 NOTE — ED Notes (Signed)
Pt states that he would like a copy of his CT scans to show to his neurologist. This RN notified CT, who will oblige.

## 2014-04-28 NOTE — ED Provider Notes (Signed)
CSN: [id number]     Arrival date & time 04/28/14  0000 History  This chart was scribed for Evelina Bucy, MD by Evelene Croon, ED Scribe. This patient was seen in room A13C/A13C and the patient's care was started 12:17 AM.    Chief Complaint  Patient presents with  . Back Pain    Patient is a 63 y.o. male presenting with back pain. The history is provided by the patient. No language interpreter was used.  Back Pain Location:  Lumbar spine Radiates to:  R posterior upper leg and L posterior upper leg Onset quality:  Gradual Duration:  4 days Timing:  Constant Progression:  Worsening Relieved by:  Nothing Associated symptoms: no bladder incontinence, no bowel incontinence, no fever, no numbness, no tingling and no weakness      HPI Comments:  [name] is a 63 y.o. male with a h/o multiple back surgeries and chronic back pain who presents to the Emergency Department complaining of constant lower back pain that worsened within the past 4 days. He states he heard a  pop two different times in the last 4 days. He denies lifting or strenous activity prior to popping. His pain radiates down his BLE with movement. He denies numbness/tingling down BLE, bowel/bladder incontinence, fever, nausea and vomiting. No alleviating factors noted.   Past Medical History  Diagnosis Date  . Sleep apnea     not tested  . Blood transfusion   . Shingles     10  . Hypertension   . Headache(784.0)   . Arthritis   . H/O hiatal hernia   . GERD (gastroesophageal reflux disease)     barretts esophagus  . PONV (postoperative nausea and vomiting)     trouble breathing after last surgery  . Tumor liver     no problems  . Cancer 2007    melanoma stage 3 stomach bx  . Barrett esophagus    Past Surgical History  Procedure Laterality Date  . Spinal cord stimulator implant      rt hip  . Melanoma excision      lft abdomen  . Shoulder arthroscopy      rt bith defect , spur, rotator cuff  . Thyroid  lobectomy      rt  . Leg surgery      x4 crushed   . Knee arthroplasty      lft  . Abdominal surgery      x3  . Chest surgery      lung incisional hernia for hernia  . Lithotripsy    . Hemorrhoid surgery      x2  . Joint replacement      lft knee  . Hernia repair    . Hiatal hernia repair    . Posterior cervical fusion/foraminotomy  08/18/2011    Procedure: POSTERIOR CERVICAL FUSION/FORAMINOTOMY LEVEL 3;  Surgeon: Ophelia Charter, MD;  Location: [facility];  Service: Neurosurgery;  Laterality: N/A;  Cervical three-four, four-five, five-six posterior cervical  fusion with instrumentation; Right cervical three-four, four-five laminotomy  . Back surgeries      x 9  . Back surgery      x8  . Neck surgery  2012  . Esophagogastroduodenoscopy (egd) with propofol N/A 03/28/2013    Procedure: ESOPHAGOGASTRODUODENOSCOPY (EGD) WITH PROPOFOL;  Surgeon: Rogene Houston, MD;  Location: [facility];  Service: Endoscopy;  Laterality: N/A;  GE junction @ 38, proximal  margin @37   . Esophageal biopsy N/A 03/28/2013  Procedure: Distal Esophageal Biopsy;  Surgeon: Rogene Houston, MD;  Location: [facility];  Service: Endoscopy;  Laterality: N/A;   Family History  Problem Relation Age of Onset  . Heart disease Father   . Heart disease Paternal Grandmother   . Heart disease Paternal Grandfather   . Heart disease Maternal Grandfather   . Heart disease Brother     heart transplant  . Aneurysm      pt states several members have aortic aneurysm  . Lymphoma Brother   . Skin cancer      several members   History  Substance Use Topics  . Smoking status: Former Smoker -- 4.00 packs/day for 10 years    Types: Cigarettes    Quit date: 08/12/1980  . Smokeless tobacco: Former Systems developer     Comment: occ wine  . Alcohol Use: No    Review of Systems  Constitutional: Negative for fever.  Gastrointestinal: Negative for nausea, vomiting and bowel incontinence.  Genitourinary: Negative for bladder  incontinence.  Musculoskeletal: Positive for back pain.  Neurological: Negative for tingling, weakness and numbness.  All other systems reviewed and are negative.     Allergies  Demerol  Home Medications   Prior to Admission medications   Medication Sig Start Date End Date Taking? Authorizing Provider  calcium-vitamin D (OSCAL WITH D) 500-200 MG-UNIT per tablet Take 1 tablet by mouth 2 (two) times daily.     Historical Provider, MD  dexlansoprazole (DEXILANT) 60 MG capsule Take 60 mg by mouth daily.    Historical Provider, MD  diazepam (VALIUM) 10 MG tablet Take 10 mg by mouth daily as needed. Muscle spasms    Historical Provider, MD  fexofenadine (ALLEGRA) 180 MG tablet Take 180 mg by mouth every evening.    Historical Provider, MD  fish oil-omega-3 fatty acids 1000 MG capsule Take 1 g by mouth 2 (two) times daily.    Historical Provider, MD  gabapentin (NEURONTIN) 600 MG tablet Take 600 mg by mouth 3 (three) times daily.    Historical Provider, MD  lisinopril (PRINIVIL,ZESTRIL) 10 MG tablet Take 10 mg by mouth daily.    Historical Provider, MD  montelukast (SINGULAIR) 10 MG tablet Take 10 mg by mouth daily.     Historical Provider, MD  Multiple Vitamin (MULITIVITAMIN WITH MINERALS) TABS Take 1 tablet by mouth daily.    Historical Provider, MD  oxyCODONE-acetaminophen (PERCOCET) 10-325 MG per tablet Take 1 tablet by mouth every 4 (four) hours as needed for pain. 03/13/12   Newman Pies, MD  rosuvastatin (CRESTOR) 10 MG tablet Take 10 mg by mouth daily.    Historical Provider, MD   BP 107/66 mmHg  Pulse 62  Temp(Src) 97.5 F (36.4 C) (Oral)  Resp 26  SpO2 90% Physical Exam  Constitutional: He is oriented to person, place, and time. He appears well-developed and well-nourished. No distress.  HENT:  Head: Normocephalic and atraumatic.  Mouth/Throat: Oropharynx is clear and moist. No oropharyngeal exudate.  Eyes: Conjunctivae and EOM are normal. Pupils are equal, round, and  reactive to light.  Neck: Normal range of motion. Neck supple.  No meningismus.  Cardiovascular: Normal rate, regular rhythm, normal heart sounds and intact distal pulses.   No murmur heard. Pulmonary/Chest: Effort normal and breath sounds normal. No respiratory distress.  Abdominal: Soft. There is no tenderness. There is no rebound and no guarding.  Musculoskeletal: Normal range of motion. He exhibits no edema or tenderness.       Lumbar back: He exhibits  bony tenderness (T12-L1).  Neurological: He is alert and oriented to person, place, and time. No cranial nerve deficit. He exhibits normal muscle tone. Coordination normal.  Reflex Scores:      Patellar reflexes are 2+ on the right side and 2+ on the left side. No ataxia on finger to nose bilaterally. No pronator drift. 5/5 strength throughout. CN 2-12 intact. Negative Romberg. Equal grip strength. Sensation intact. Gait is normal.   Skin: Skin is warm.  Psychiatric: He has a normal mood and affect. His behavior is normal.  Nursing note and vitals reviewed.   ED Course  Procedures   DIAGNOSTIC STUDIES:  Oxygen Saturation is 98% on RA, normal by my interpretation.    COORDINATION OF CARE:  12:26 AM Discussed treatment plan with pt at bedside and pt agreed to plan.  Labs Review Labs Reviewed - No data to display  Imaging Review Ct Thoracic Spine Wo Contrast  04/28/2014   CLINICAL DATA:  Severe mid back pain, history of 9 prior back surgeries. Felt pop in back on Monday and has continual pain subsequently.  EXAM: CT THORACIC AND LUMBAR SPINE WITHOUT CONTRAST  TECHNIQUE: Multidetector CT imaging of the thoracic and lumbar spine was performed without contrast. Multiplanar CT image reconstructions were also generated.  COMPARISON:  CT angiogram of the chest April 05, 2014 and CT of the abdomen and pelvis December 20, 2013  FINDINGS: CT THORACIC SPINE FINDINGS  Remote T4 burst fracture transfixed with T2, T3, T5 and T6 pedicle screws,  bridging rods. T3, T4 laminectomies. Hardware appears intact and well-seated, no periprosthetic lucency. Mild T3 superior endplate and cavity may reflect Schmorl's node in is unchanged. Additional scattered chronic Schmorl's nodes. No acute thoracic spine fracture or malalignment. Maintenance of thoracic kyphosis. Bone mineral density is decreased without destructive bony lesions.  Spinal stimulator leads at T7-8, via T9-10 interlaminar approach, laminectomy. Thoracic spinal soft tissues scarring consistent with prior surgery. Mildly patulous esophagus with layering debris. The heart is enlarged, partially imaged. Moderate hiatal hernia.  CT LUMBAR SPINE FINDINGS  Status post L2 tear S1 posterior instrumentation with intact well seated pedicle screws without periprosthetic lucency. L1 to through L5-S1 interbody disc material with arthrodesis at L3-4 through L5-S1 and likely at L2-3 intervening rod at L4. Lumbar vertebral bodies are intact and aligned with maintenance of lumbar lordosis. Status post L3-4 laminectomies.  Patient is osteopenic without destructive bony lesions. Torturous, moderate to severely calcific aortoiliac vessels. Partially imaged colonic diverticulosis.  IMPRESSION: CT THORACIC SPINE IMPRESSION  No acute fracture or malalignment.  Remote upper thoracic hardware transfixing T4 burst fracture with T3 and T4 laminectomies. No CT findings of hardware failure.  Moderate hiatal hernia with mildly patulous esophagus with debris, aspiration risk.  Osteopenia.  CT LUMBAR SPINE IMPRESSION  No acute fracture or malalignment.  Status post L2 through S1 PLIF, L3-4 laminectomy. No CT findings of hardware failure.  Osteopenia.   Electronically Signed   By: Elon Alas   On: 04/28/2014 01:18   Ct Lumbar Spine Wo Contrast  04/28/2014   CLINICAL DATA:  Severe mid back pain, history of 9 prior back surgeries. Felt pop in back on Monday and has continual pain subsequently.  EXAM: CT THORACIC AND LUMBAR  SPINE WITHOUT CONTRAST  TECHNIQUE: Multidetector CT imaging of the thoracic and lumbar spine was performed without contrast. Multiplanar CT image reconstructions were also generated.  COMPARISON:  CT angiogram of the chest April 05, 2014 and CT of the abdomen and pelvis December 20, 2013  FINDINGS: CT THORACIC SPINE FINDINGS  Remote T4 burst fracture transfixed with T2, T3, T5 and T6 pedicle screws, bridging rods. T3, T4 laminectomies. Hardware appears intact and well-seated, no periprosthetic lucency. Mild T3 superior endplate and cavity may reflect Schmorl's node in is unchanged. Additional scattered chronic Schmorl's nodes. No acute thoracic spine fracture or malalignment. Maintenance of thoracic kyphosis. Bone mineral density is decreased without destructive bony lesions.  Spinal stimulator leads at T7-8, via T9-10 interlaminar approach, laminectomy. Thoracic spinal soft tissues scarring consistent with prior surgery. Mildly patulous esophagus with layering debris. The heart is enlarged, partially imaged. Moderate hiatal hernia.  CT LUMBAR SPINE FINDINGS  Status post L2 tear S1 posterior instrumentation with intact well seated pedicle screws without periprosthetic lucency. L1 to through L5-S1 interbody disc material with arthrodesis at L3-4 through L5-S1 and likely at L2-3 intervening rod at L4. Lumbar vertebral bodies are intact and aligned with maintenance of lumbar lordosis. Status post L3-4 laminectomies.  Patient is osteopenic without destructive bony lesions. Torturous, moderate to severely calcific aortoiliac vessels. Partially imaged colonic diverticulosis.  IMPRESSION: CT THORACIC SPINE IMPRESSION  No acute fracture or malalignment.  Remote upper thoracic hardware transfixing T4 burst fracture with T3 and T4 laminectomies. No CT findings of hardware failure.  Moderate hiatal hernia with mildly patulous esophagus with debris, aspiration risk.  Osteopenia.  CT LUMBAR SPINE IMPRESSION  No acute fracture or  malalignment.  Status post L2 through S1 PLIF, L3-4 laminectomy. No CT findings of hardware failure.  Osteopenia.   Electronically Signed   By: Elon Alas   On: 04/28/2014 01:18     EKG Interpretation None      MDM   Final diagnoses:  Back pain    63 year old male here with back pain. Numerous surgeries on his back. The pop a few times after rolling over yesterday. Denies any tingling, numbness, weakness. No loss of bowel or bladder control. No saddle anesthesia. No fevers, vomiting. No relief with pain meds at home. On exam, patient's having extreme upper L-spine lower T-spine pain on exam. Neurovascularly intact distally with normal reflexes, normal strength and sensation. Plan for CT scan. Scan normal, no broken bones. Severe nausea with pain meds, but pain is mildly improving. Patient ready for discharge, asking to be discharged. Will have him f/u with his neurosurgeon.   I personally performed the services described in this documentation, which was scribed in my presence. The recorded information has been reviewed and is accurate.     Evelina Bucy, MD 04/28/14 [phone]

## 2014-04-28 NOTE — ED Notes (Addendum)
Dr. Walden at bedside 

## 2014-04-28 NOTE — ED Notes (Signed)
Pt has decided that he no longer wants to wait for the CT disk to burn, and wants to leave. Nurse tech escorted the pt out ina wheel chair.

## 2014-05-04 ENCOUNTER — Encounter (INDEPENDENT_AMBULATORY_CARE_PROVIDER_SITE_OTHER): Payer: Self-pay | Admitting: *Deleted

## 2014-05-19 ENCOUNTER — Encounter (INDEPENDENT_AMBULATORY_CARE_PROVIDER_SITE_OTHER): Payer: Self-pay

## 2014-06-05 DIAGNOSIS — I774 Celiac artery compression syndrome: Secondary | ICD-10-CM | POA: Diagnosis not present

## 2014-06-05 DIAGNOSIS — I714 Abdominal aortic aneurysm, without rupture: Secondary | ICD-10-CM | POA: Diagnosis not present

## 2014-06-06 ENCOUNTER — Ambulatory Visit (INDEPENDENT_AMBULATORY_CARE_PROVIDER_SITE_OTHER): Payer: Medicare Other | Admitting: Internal Medicine

## 2014-06-14 DIAGNOSIS — G4733 Obstructive sleep apnea (adult) (pediatric): Secondary | ICD-10-CM | POA: Diagnosis not present

## 2014-06-26 ENCOUNTER — Ambulatory Visit (INDEPENDENT_AMBULATORY_CARE_PROVIDER_SITE_OTHER): Payer: Medicare Other | Admitting: Internal Medicine

## 2014-06-26 ENCOUNTER — Encounter (INDEPENDENT_AMBULATORY_CARE_PROVIDER_SITE_OTHER): Payer: Self-pay | Admitting: Internal Medicine

## 2014-06-26 VITALS — BP 120/78 | HR 72 | Temp 97.5°F | Resp 18 | Ht 72.0 in | Wt 233.6 lb

## 2014-06-26 DIAGNOSIS — K219 Gastro-esophageal reflux disease without esophagitis: Secondary | ICD-10-CM | POA: Diagnosis not present

## 2014-06-26 DIAGNOSIS — K7689 Other specified diseases of liver: Secondary | ICD-10-CM

## 2014-06-26 DIAGNOSIS — K769 Liver disease, unspecified: Secondary | ICD-10-CM

## 2014-06-26 NOTE — Patient Instructions (Signed)
Call if [place] quits working.

## 2014-06-26 NOTE — Progress Notes (Signed)
Presenting complaint;  Follow-up for chronic GERD.  Subjective:  Patient is 64 year old Caucasian male who has chronic GERD complicated by short segment Barrett's esophagus. He has undergone anti-reflux surgery 3 times in the past. He did not have long lasting benefit. Last EGD was in October 2014 and biopsy was negative for Barrett's. He has heartburn no more than once or twice a week relieved with Tums. He also has intermittent chest pain. About 2 months ago he had extensive cardiac evaluation which was negative. He does not have chest pain when he rides stationary bike which he does so every day. He denies dysphagia chronic cough or hoarseness. He has occasional regurgitation. He has formed stool every day. He hasn't had diarrhea and a long time. He writes stationary bike for more than 10 minutes daily. He states he is not able to walk on account of her for neuropathy and back pain. Last colonoscopy was in December 2008.   Current Medications: Outpatient Encounter Prescriptions as of 06/26/2014  Medication Sig  . calcium-vitamin D (OSCAL WITH D) 500-200 MG-UNIT per tablet Take 1 tablet by mouth 2 (two) times daily.   Marland Kitchen dexlansoprazole (DEXILANT) 60 MG capsule Take 60 mg by mouth daily.  . diazepam (VALIUM) 10 MG tablet Take 10 mg by mouth daily as needed. Muscle spasms  . fexofenadine (ALLEGRA) 180 MG tablet Take 180 mg by mouth every evening.  . fish oil-omega-3 fatty acids 1000 MG capsule Take 1 g by mouth 2 (two) times daily.  Marland Kitchen gabapentin (NEURONTIN) 600 MG tablet Take 600 mg by mouth 3 (three) times daily.  Marland Kitchen lisinopril (PRINIVIL,ZESTRIL) 10 MG tablet Take 10 mg by mouth daily.  . montelukast (SINGULAIR) 10 MG tablet Take 10 mg by mouth daily.   . Multiple Vitamin (MULITIVITAMIN WITH MINERALS) TABS Take 1 tablet by mouth daily.  . NUCYNTA 50 MG TABS tablet Take 50 mg by mouth daily as needed.   . rosuvastatin (CRESTOR) 10 MG tablet Take 10 mg by mouth daily.  . [DISCONTINUED]  oxyCODONE-acetaminophen (PERCOCET) 10-325 MG per tablet Take 1 tablet by mouth every 4 (four) hours as needed for pain.     Objective: Blood pressure 120/78, pulse 72, temperature 97.5 F (36.4 C), temperature source Oral, resp. rate 18, height 6' (1.829 m), weight 233 lb 9.6 oz (105.96 kg). Patient is alert and in no acute distress. Conjunctiva is pink. Sclera is nonicteric Oropharyngeal mucosa is normal. No neck masses or thyromegaly noted. Cardiac exam with regular rhythm normal S1 and S2. No murmur or gallop noted. Lungs are clear to auscultation. Abdomen is full but soft with mild midepigastric tenderness but no organomegaly or masses. No LE edema or clubbing noted.   Assessment:  #1. Chronic GERD complicated by short segment Barrett's esophagus which possibly has been limited by multiple biopsies. Last EGD with biopsy was in October 2014. He has failed multiple anti-reflux surgeries. Symptoms are well controlled with therapy. #2. He is average risk for CRC. Last colonoscopy was in December 2008 with removal of two polyps and these were non-adenomatous. #3. Stable hepatic lesions. Last CT was in 12/20/2013. He has hemangioma in the right lobe and simple cysts in left lobe. No further workup or follow-up needed.   Plan:  Continue anti-reflux measures. Continue Dexilant at 60 mg by mouth every morning. He will undergo EGD and colonoscopy in January 2019. Office visit in 1 year.Marland Kitchen\

## 2014-07-15 DIAGNOSIS — G4733 Obstructive sleep apnea (adult) (pediatric): Secondary | ICD-10-CM | POA: Diagnosis not present

## 2014-09-14 ENCOUNTER — Ambulatory Visit (INDEPENDENT_AMBULATORY_CARE_PROVIDER_SITE_OTHER): Payer: Medicare Other | Admitting: Pulmonary Disease

## 2014-09-14 ENCOUNTER — Encounter: Payer: Self-pay | Admitting: Pulmonary Disease

## 2014-09-14 VITALS — BP 138/62 | HR 68 | Temp 97.9°F | Ht 72.0 in | Wt 237.4 lb

## 2014-09-14 DIAGNOSIS — G4733 Obstructive sleep apnea (adult) (pediatric): Secondary | ICD-10-CM

## 2014-09-14 MED ORDER — GABAPENTIN 600 MG PO TABS
600.0000 mg | ORAL_TABLET | Freq: Three times a day (TID) | ORAL | Status: DC
Start: 2014-09-14 — End: 2015-06-28

## 2014-09-14 NOTE — Progress Notes (Signed)
   Subjective:    Patient ID: [name], male    DOB: [birth year], 64 y.o.   MRN: [id number]  HPI The patient comes in today for follow-up of his obstructive sleep apnea. He is wearing C Pap compliantly, and his download shows good control of his AHI with no significant mask leak. He is only averaging about 4 hours a night with his device because of insomnia and chronic pain. However, he continues to feel that his device is helping his sleep and daytime alertness. Of note, his weight is essentially unchanged from the last visit.   Review of Systems  Constitutional: Negative for fever and unexpected weight change.  HENT: Negative for congestion, dental problem, ear pain, nosebleeds, postnasal drip, rhinorrhea, sinus pressure, sneezing, sore throat and trouble swallowing.   Eyes: Negative for redness and itching.  Respiratory: Negative for cough, chest tightness, shortness of breath and wheezing.   Cardiovascular: Negative for palpitations and leg swelling.  Gastrointestinal: Negative for nausea and vomiting.  Genitourinary: Negative for dysuria.  Musculoskeletal: Negative for joint swelling.  Skin: Negative for rash.  Neurological: Negative for headaches.  Hematological: Does not bruise/bleed easily.  Psychiatric/Behavioral: Negative for dysphoric mood. The patient is not nervous/anxious.        Objective:   Physical Exam Well-developed male in no acute distress Nose without purulence or discharge noted Neck without lymphadenopathy or thyromegaly No skin breakdown or pressure necrosis from the C Pap mask Lower extremities without edema, no cyanosis Alert and oriented, moves all 4 extremities.       Assessment & Plan:

## 2014-09-14 NOTE — Assessment & Plan Note (Signed)
The pt continues to do well with cpap by his download, although is only getting 4hrs of use a night.  He continues to be plagued by insomnia and chronic pain related to his back.  He feels the cpap is helping his sleep and daytime alertness.  I have encouraged him to stay on his device as much as possible, and to work on weight loss.

## 2014-09-14 NOTE — Patient Instructions (Signed)
Stay on cpap, and keep up with mask changes and supplies. Work on [employer] loss Will give you a 30 day script for your gabapentin until you can get in with your new primary doctor.  followup again in one year.

## 2015-01-10 ENCOUNTER — Other Ambulatory Visit: Payer: Self-pay | Admitting: Neurosurgery

## 2015-01-10 DIAGNOSIS — Z8249 Family history of ischemic heart disease and other diseases of the circulatory system: Secondary | ICD-10-CM

## 2015-01-11 ENCOUNTER — Other Ambulatory Visit: Payer: Self-pay | Admitting: Neurosurgery

## 2015-01-11 DIAGNOSIS — G8929 Other chronic pain: Secondary | ICD-10-CM

## 2015-01-11 DIAGNOSIS — M542 Cervicalgia: Secondary | ICD-10-CM

## 2015-01-11 DIAGNOSIS — M546 Pain in thoracic spine: Secondary | ICD-10-CM

## 2015-01-11 DIAGNOSIS — M545 Low back pain, unspecified: Secondary | ICD-10-CM

## 2015-01-22 ENCOUNTER — Ambulatory Visit
Admission: RE | Admit: 2015-01-22 | Discharge: 2015-01-22 | Disposition: A | Discharge status: Home / Self Care | Payer: Medicare Other | Source: Ambulatory Visit | Attending: Neurosurgery | Admitting: Neurosurgery

## 2015-01-22 DIAGNOSIS — M545 Low back pain, unspecified: Secondary | ICD-10-CM

## 2015-01-22 DIAGNOSIS — M546 Pain in thoracic spine: Secondary | ICD-10-CM

## 2015-01-22 DIAGNOSIS — M542 Cervicalgia: Secondary | ICD-10-CM

## 2015-01-22 DIAGNOSIS — G8929 Other chronic pain: Secondary | ICD-10-CM

## 2015-01-22 MED ORDER — IOHEXOL 300 MG/ML  SOLN
10.0000 mL | Freq: Once | INTRAMUSCULAR | Status: DC | PRN
  Administered 2015-01-22: 10 mL via INTRATHECAL

## 2015-01-22 MED ORDER — DIAZEPAM 5 MG PO TABS
5.0000 mg | ORAL_TABLET | Freq: Once | ORAL | Status: AC
  Administered 2015-01-22: 10 mg via ORAL

## 2015-01-22 MED ORDER — ONDANSETRON HCL 4 MG/2ML IJ SOLN: 4.0000 mg | Freq: Four times a day (QID) | INTRAMUSCULAR | Status: DC | PRN

## 2015-01-22 NOTE — Discharge Instructions (Signed)
Myelogram Discharge Instructions  1. Go home and rest quietly for the next 24 hours.  It is important to lie flat for the next 24 hours.  Get up only to go to the restroom.  You may lie in the bed or on a couch on your back, your stomach, your left side or your right side.  You may have one pillow under your head.  You may have pillows between your knees while you are on your side or under your knees while you are on your back.  2. DO NOT drive today.  Recline the seat as far back as it will go, while still wearing your seat belt, on the way home.  3. You may get up to go to the bathroom as needed.  You may sit up for 10 minutes to eat.  You may resume your normal diet and medications unless otherwise indicated.  Drink lots of extra fluids today and tomorrow.  4. The incidence of headache, nausea, or vomiting is about 5% (one in 20 patients).  If you develop a headache, lie flat and drink plenty of fluids until the headache goes away.  Caffeinated beverages may be helpful.  If you develop severe nausea and vomiting or a headache that does not go away with flat bed rest, call [phone].  5. You may resume normal activities after your 24 hours of bed rest is over; however, do not exert yourself strongly or do any heavy lifting tomorrow. If when you get up you have a headache when standing, go back to bed and force fluids for another 24 hours.  6. Call your physician for a follow-up appointment.  The results of your myelogram will be sent directly to your physician by the following day.  7. If you have any questions or if complications develop after you arrive home, please call [phone].  Discharge instructions have been explained to the patient.  The patient, or the person responsible for the patient, fully understands these instructions.       May resume Nucynta and Promethazine on Aug. 23, 2016, after 9:30 am.

## 2015-01-22 NOTE — Progress Notes (Signed)
Patient states he has been off Nucynta and Promethazine for at least the past two days.

## 2015-01-26 ENCOUNTER — Other Ambulatory Visit: Payer: Self-pay | Admitting: Neurosurgery

## 2015-01-26 DIAGNOSIS — Z8249 Family history of ischemic heart disease and other diseases of the circulatory system: Secondary | ICD-10-CM

## 2015-03-06 ENCOUNTER — Ambulatory Visit
Admission: RE | Admit: 2015-03-06 | Discharge: 2015-03-06 | Disposition: A | Discharge status: Home / Self Care | Payer: Medicare Other | Source: Ambulatory Visit | Attending: Neurosurgery | Admitting: Neurosurgery

## 2015-03-06 DIAGNOSIS — Z8249 Family history of ischemic heart disease and other diseases of the circulatory system: Secondary | ICD-10-CM

## 2015-03-06 MED ORDER — IOPAMIDOL (ISOVUE-370) INJECTION 76%
80.0000 mL | Freq: Once | INTRAVENOUS | Status: AC | PRN
  Administered 2015-03-06: 80 mL via INTRAVENOUS

## 2015-04-11 ENCOUNTER — Encounter (INDEPENDENT_AMBULATORY_CARE_PROVIDER_SITE_OTHER): Payer: Self-pay | Admitting: *Deleted

## 2015-06-28 ENCOUNTER — Ambulatory Visit (INDEPENDENT_AMBULATORY_CARE_PROVIDER_SITE_OTHER): Payer: 59 | Admitting: Internal Medicine

## 2015-06-28 ENCOUNTER — Encounter (INDEPENDENT_AMBULATORY_CARE_PROVIDER_SITE_OTHER): Payer: Self-pay | Admitting: Internal Medicine

## 2015-06-28 VITALS — BP 150/90 | HR 76 | Temp 98.3°F | Ht 72.0 in | Wt 216.4 lb

## 2015-06-28 DIAGNOSIS — K219 Gastro-esophageal reflux disease without esophagitis: Secondary | ICD-10-CM | POA: Diagnosis not present

## 2015-06-28 NOTE — Patient Instructions (Signed)
Continue the Dexilant.  OV in 1 yr.

## 2015-06-28 NOTE — Progress Notes (Signed)
Subjective:    Patient ID: [name], male    DOB: 05-04-1951, 65 y.o.   MRN: [id number]  HPI Here today for f/u of his chronic GERD complicated by short segment Barrett's esophagus.    He has undergone anti-reflux surgery x 3 in the past.  His last EGD was in 2014 and the biopsy was negative for Barrett's. There was no evidence of erosive esophagitis. Loose fundal wrap.  His last colonoscopy was in December of 2008 with removal of 2 polyps and these were non-adenmatous.  His acid reflux is for the most part controlled.  His appetite good. He has lost about 17 pounds.  He is exercising. He has changed his eating habits.    Review of Systems Past Medical History  Diagnosis Date  . Sleep apnea     not tested  . Blood transfusion   . Shingles     10  . Hypertension   . Headache(784.0)   . Arthritis   . H/O hiatal hernia   . GERD (gastroesophageal reflux disease)     barretts esophagus  . PONV (postoperative nausea and vomiting)     trouble breathing after last surgery  . Tumor liver     no problems  . Cancer ([place]) 2007    melanoma stage 3 stomach bx  . Barrett esophagus     Past Surgical History  Procedure Laterality Date  . Spinal cord stimulator implant      rt hip  . Melanoma excision      lft abdomen  . Shoulder arthroscopy      rt bith defect , spur, rotator cuff  . Thyroid lobectomy      rt  . Leg surgery      x4 crushed   . Knee arthroplasty      lft  . Abdominal surgery      x3  . Chest surgery      lung incisional hernia for hernia  . Lithotripsy    . Hemorrhoid surgery      x2  . Joint replacement      lft knee  . Hernia repair    . Hiatal hernia repair    . Posterior cervical fusion/foraminotomy  08/18/2011    Procedure: POSTERIOR CERVICAL FUSION/FORAMINOTOMY LEVEL 3;  Surgeon: Ophelia Charter, MD;  Location: [facility];  Service: Neurosurgery;  Laterality: N/A;  Cervical three-four, four-five, five-six posterior cervical  fusion with  instrumentation; Right cervical three-four, four-five laminotomy  . Back surgeries      x 9  . Back surgery      x8  . Neck surgery  2012  . Esophagogastroduodenoscopy (egd) with propofol N/A 03/28/2013    Procedure: ESOPHAGOGASTRODUODENOSCOPY (EGD) WITH PROPOFOL;  Surgeon: Rogene Houston, MD;  Location: [facility];  Service: Endoscopy;  Laterality: N/A;  GE junction @ 38, proximal  margin @37   . Esophageal biopsy N/A 03/28/2013    Procedure: Distal Esophageal Biopsy;  Surgeon: Rogene Houston, MD;  Location: [facility];  Service: Endoscopy;  Laterality: N/A;    Allergies  Allergen Reactions  . Demerol Other (See Comments)    "Makes me crazy."    Current Outpatient Prescriptions on File Prior to Visit  Medication Sig Dispense Refill  . calcium-vitamin D (OSCAL WITH D) 500-200 MG-UNIT per tablet Take 1 tablet by mouth 2 (two) times daily.     Marland Kitchen dexlansoprazole (DEXILANT) 60 MG capsule Take 60 mg by mouth daily.    . diazepam (  VALIUM) 10 MG tablet Take 10 mg by mouth daily as needed. Muscle spasms    . fexofenadine (ALLEGRA) 180 MG tablet Take 180 mg by mouth every evening.    . fish oil-omega-3 fatty acids 1000 MG capsule Take 1 g by mouth 2 (two) times daily.    Marland Kitchen lisinopril (PRINIVIL,ZESTRIL) 10 MG tablet Take 10 mg by mouth daily.    . montelukast (SINGULAIR) 10 MG tablet Take 10 mg by mouth daily.     . Multiple Vitamin (MULITIVITAMIN WITH MINERALS) TABS Take 1 tablet by mouth daily.    . rosuvastatin (CRESTOR) 10 MG tablet Take 10 mg by mouth daily.     No current facility-administered medications on file prior to visit.        Objective:   Physical Exam Blood pressure 150/90, pulse 76, temperature 98.3 F (36.8 C), height 6' (1.829 m), weight 216 lb 6.4 oz (98.158 kg). Alert and oriented. Skin warm and dry. Oral mucosa is moist.   . Sclera anicteric, conjunctivae is pink. Thyroid not enlarged. No cervical lymphadenopathy. Lungs clear. Heart regular rate and rhythm.  Abdomen is  soft. Bowel sounds are positive. No hepatomegaly. No abdominal masses felt. No tenderness.  No edema to lower extremities.          Assessment & Plan:    . Chronic GERD complicated by short segment Barrett's esophagus which possibly has been limited by multiple biopsies. Last EGD with biopsy was in October 2014. He has failed multiple anti-reflux surgeries. Symptoms are well controlled with therapy.  He is average risk for CRC. Last colonoscopy was in December 2008 with removal of two polyps and these were non-adenomatous.

## 2015-09-13 ENCOUNTER — Ambulatory Visit (HOSPITAL_COMMUNITY): Payer: 59 | Admitting: Physical Therapy

## 2015-09-17 ENCOUNTER — Ambulatory Visit (HOSPITAL_COMMUNITY): Payer: Medicare Other | Attending: Orthopedic Surgery | Admitting: Physical Therapy

## 2015-09-17 DIAGNOSIS — M25552 Pain in left hip: Secondary | ICD-10-CM | POA: Diagnosis present

## 2015-09-17 DIAGNOSIS — M6281 Muscle weakness (generalized): Secondary | ICD-10-CM

## 2015-09-17 DIAGNOSIS — R262 Difficulty in walking, not elsewhere classified: Secondary | ICD-10-CM | POA: Insufficient documentation

## 2015-09-17 NOTE — Patient Instructions (Signed)
Heel Raise: Bilateral (Standing)    Rise on balls of feet. Repeat __15__ times per set. Do _1___ sets per session. Do __3__ sessions per day.  http://orth.exer.us/38   Copyright  VHI. All rights reserved.  Balance: Unilateral    Attempt to balance on left leg, eyes open. Hold __30__ seconds. Repeat _5___ times per set. Do _1___ sets per session. Do _3___ sessions per day. Perform exercise with eyes closed.  http://orth.exer.us/28   Copyright  VHI. All rights reserved.  Functional Quadriceps: Chair Squat    Keeping feet flat on floor, shoulder width apart, squat as low as is comfortable. Use support as necessary. Repeat _10-15___ times per set. Do __1__ sets per session. Do ____ sessions per day. 3 http://orth.exer.us/736   Copyright  VHI. All rights reserved.  Bent Leg Lift (Hook-Lying)    Tighten stomach and slowly raise left  leg _3___ inches from floor. Keep trunk rigid. Hold _3___ seconds.  Gradually straighten your leg  Repeat __19-15__ times per set. Do ____ sets per session. Do ____ sessions per day. 12 http://orth.exer.us/1090   Copyright  VHI. All rights reserved.  Strengthening: Hip Abductor - Resisted    With band looped around both legs above knees, push thighs apart. Repeat _10-15___ times per set. Do ___1_ sets per session. Do __2__ sessions per day.  http://orth.exer.us/688   Copyright  VHI. All rights reserved.  Bridging    Slowly raise buttocks from floor, keeping stomach tight. Repeat _10-15___ times per set. Do __1__ sets per session. Do ___3_ sessions per day.  http://orth.exer.us/1096   Copyright  VHI. All rights reserved.

## 2015-09-17 NOTE — Therapy (Addendum)
[place], [place], [address] Phone: [phone]   Fax:  [phone]  Physical Therapy Evaluation  Patient Details  Name: [name] MRN: [id number] Date of Birth: 17-Dec-1950 Referring Provider: Darletta Moll  Encounter Date: 09/17/2015      PT End of Session - 09/17/15 1526    Visit Number 1   Number of Visits 12   Authorization Type [employer]   Authorization - Visit Number 1   Authorization - Number of Visits 10   PT Start Time 0762   PT Stop Time 1345   PT Time Calculation (min) 42 min   Activity Tolerance Patient tolerated treatment well   Behavior During Therapy [facility] for tasks assessed/performed      Past Medical History  Diagnosis Date  . Sleep apnea     not tested  . Blood transfusion   . Shingles     10  . Hypertension   . Headache(784.0)   . Arthritis   . H/O hiatal hernia   . GERD (gastroesophageal reflux disease)     barretts esophagus  . PONV (postoperative nausea and vomiting)     trouble breathing after last surgery  . Tumor liver     no problems  . Cancer [place]) 2007    melanoma stage 3 stomach bx  . Barrett esophagus     Past Surgical History  Procedure Laterality Date  . Spinal cord stimulator implant      rt hip  . Melanoma excision      lft abdomen  . Shoulder arthroscopy      rt bith defect , spur, rotator cuff  . Thyroid lobectomy      rt  . Leg surgery      x4 crushed   . Knee arthroplasty      lft  . Abdominal surgery      x3  . Chest surgery      lung incisional hernia for hernia  . Lithotripsy    . Hemorrhoid surgery      x2  . Joint replacement      lft knee  . Hernia repair    . Hiatal hernia repair    . Posterior cervical fusion/foraminotomy  08/18/2011    Procedure: POSTERIOR CERVICAL FUSION/FORAMINOTOMY LEVEL 3;  Surgeon: Ophelia Charter, MD;  Location: [place] [facility];  Service: Neurosurgery;  Laterality: N/A;  Cervical three-four,  four-five, five-six posterior cervical  fusion with instrumentation; Right cervical three-four, four-five laminotomy  . Back surgeries      x 9  . Back surgery      x8  . Neck surgery  2012  . Esophagogastroduodenoscopy (egd) with propofol N/A 03/28/2013    Procedure: ESOPHAGOGASTRODUODENOSCOPY (EGD) WITH PROPOFOL;  Surgeon: Rogene Houston, MD;  Location: [facility];  Service: Endoscopy;  Laterality: N/A;  GE junction @ 38, proximal  margin @37   . Biopsy N/A 03/28/2013    Procedure: Distal Esophageal Biopsy;  Surgeon: Rogene Houston, MD;  Location: [facility];  Service: Endoscopy;  Laterality: N/A;    There were no vitals filed for this visit.       Subjective Assessment - 09/17/15 1304    Subjective [name] states that he had a total hip replacement on 09/10/2015 at [facility].  He was discharged 09/12/2015.  He has not recieved home health services.  He is currently walking with a walker prior to the surgery he ambulated without any assistive  device.  He is currently being referred to maximize his functional ability.    Pertinent History 9 back surgeries, stimulater implant  in right hip, multiple chest and abdominal surgeries; (he has had 55 surgeries), HTN    Limitations Sitting;Lifting;Standing;Walking;House hold activities   How long can you sit comfortably? 15 minutes    How long can you stand comfortably? 10 minutes    How long can you walk comfortably? with a walker 10 minutes    Patient Stated Goals walk without an assistive device, decrease pain, Pt would like to do most of his therapy on his own.     Currently in Pain? Yes   Pain Score 5   worst 9/10; least 2/10   Pain Location Hip   Pain Orientation Left   Pain Descriptors / Indicators Aching   Pain Type Surgical pain   Pain Onset In the past 7 days   Pain Frequency Constant   Aggravating Factors  activity   Pain Relieving Factors rest, ice, tylenol             [facility] PT Assessment - 09/17/15 0001    Assessment    Medical Diagnosis Lt total hip    Referring Provider Darletta Moll   Onset Date/Surgical Date 09/10/15   Hand Dominance Right   Next MD Visit 09/21/2015   Prior Therapy Acute   Precautions   Precautions Anterior Hip   Required Braces or Orthoses Other Brace/Splint   Restrictions   Weight Bearing Restrictions No   Balance Screen   Has the patient fallen in the past 6 months Yes   How many times? 2  none since the operation    Has the patient had a decrease in activity level because of a fear of falling?  Yes   Is the patient reluctant to leave their home because of a fear of falling?  No   Home Environment   Living Environment Private residence   Type of [place] to enter   Entrance Stairs-Number of Steps 4   Additional Comments currently doing steps step to process    Prior Function   Level of Independence Independent   Vocation Retired   Leisure working in the yard, riding a bicycle, go to the [employer]   Overall Cognitive Status Within Functional Limits for tasks assessed   Observation/Other Assessments   Focus on Therapeutic Outcomes (FOTO)  44   Functional Tests   Functional tests Single leg stance   Single Leg Stance   Comments Rt 60 seconds; Lt : 60    ROM / Strength   AROM / PROM / Strength Strength   Strength   Strength Assessment Site Hip;Knee;Ankle   Right/Left Hip Left   Right/Left Knee Left   Left Knee Extension 5/5   Right/Left Ankle Left   Left Ankle Dorsiflexion 5/5                   [facility] Adult PT Treatment/Exercise - 09/17/15 0001    Exercises   Exercises Knee/Hip   Knee/Hip Exercises: Standing   Heel Raises Both;5 reps   SLS 2x   Knee/Hip Exercises: Supine   Bridges 5 reps   Other Supine Knee/Hip Exercises hip abdution with t-band  x5                 PT Education - 09/17/15 1525    Education provided Yes   Education Details HEP for strengthening  and balance    Person(s) Educated  Patient   Methods Explanation;Handout;Demonstration   Comprehension Verbalized understanding;Returned demonstration          PT Short Term Goals - 09/17/15 1532    PT SHORT TERM GOAL #1   Title Patient pain level in his left hip to be no greater than a 5/10 to allow him to walk with a cane inside for ease of  opening doors, and carrying items such as a glass of water.    Time 3   Period Weeks   Status New   PT SHORT TERM GOAL #2   Title Patient strength of his left leg to improve one half grade to allow patient to get up from sit to stand with ease    Time 3   Period Weeks   Status New   PT SHORT TERM GOAL #3   Title Patient to be able to tolerate sitting for an hour for improved tolerance of car rides to his MD>    Time 3   Period Weeks   Status New   PT SHORT TERM GOAL #4   Title Pt to be able to ambulate for 30 minutes at a time to be able to complete short shopping trips without having increased pain    Time 3   Period Weeks   Status New           PT Long Term Goals - 09/17/15 1537    PT LONG TERM GOAL #1   Title Patient pain in his left hip to be no greater than a 2/10 to allow pt to ambulate with a cane in and outside.    Time 6   Period Weeks   Status New   PT LONG TERM GOAL #2   Title Pt strength of his left leg to be improved by one grade to allow pt to go up and down 10 steps in a reciprocal manner.    Time 6   Period Weeks   Status New   PT LONG TERM GOAL #3   Title Patient to be able to tolerate sitting for two hours at a time to allow him to [place] with pain no greater than a 2/10    Time 6   Period Weeks   Status New   PT LONG TERM GOAL #4   Title Pt to be able to walk on uneven terrain for an hour to be able to work in his yard    Time [place] - 09/17/15 1527    [name] [name] is a 65 yo male who has an anterior total hip on his Lt LE on 09/10/2015.  He  has been referred by his MD for out patient skilled therapy to improve his functional ability.  [name] states that he is very motivated and would like to complete as much of his therapy as he can at home.  Evaluation demonstrates pt needing a walker for ambulation, decreased left leg strength, decreased balance and increased pain.  He will benefit from skilled therapy to address these issues and maximize his functional ability.      Rehab Potential Good   PT Frequency 2x / week   PT Duration 6 weeks   PT Treatment/Interventions ADLs/Self Care Home Management;Cryotherapy;Gait training;Stair training;Functional mobility training;Therapeutic activities;Therapeutic exercise;Balance training;Patient/family education;Manual techniques;Scar mobilization  PT Next Visit Plan begin gait training with cane, begin forward and lateral step ups  and lunging.  Give HEP for patient.  Third treatment will begin reciprocal stair climbing.    PT Home Exercise Plan given    Consulted and Agree with Plan of Care Patient      Patient will benefit from skilled therapeutic intervention in order to improve the following deficits and impairments:  Abnormal gait, Decreased activity tolerance, Decreased balance, Decreased strength, Difficulty walking, Postural dysfunction, Pain  Visit Diagnosis: Pain in left hip - Plan: PT plan of care cert/re-cert  Muscle weakness (generalized) - Plan: PT plan of care cert/re-cert  Difficulty in walking, not elsewhere classified - Plan: PT plan of care cert/re-cert  [id number] CK L3903 Ck G8980 CK Problem List Patient Active Problem List   Diagnosis Date Noted  . OSA (obstructive sleep apnea) 11/18/2013  . Liver lesion 03/01/2013  . Barrett's esophagus 03/01/2013  . GERD (gastroesophageal reflux disease) 03/01/2013  . Essential hypertension, benign 03/01/2013  . S/P lumbar fusion 03/25/2012  . Back pain without radiation 03/25/2012   Rayetta Humphrey, PT  CLT [phone] 09/17/2015, 3:47 PM  [place], [place], [address] Phone: [phone]   Fax:  [phone]  Name: [name] MRN: [id number] Date of Birth: 16-May-1951   PHYSICAL THERAPY DISCHARGE SUMMARY  Visits from Start of Care: 1  Current functional level related to goals / functional outcomes: As above Remaining deficits: As above   Education / Equipment: HEP Plan: Patient agrees to discharge.  Patient goals were not met. Patient is being discharged due to not returning since the last visit.  ?????      \  Rayetta Humphrey, PT CLT [phone]

## 2015-09-27 ENCOUNTER — Telehealth (HOSPITAL_COMMUNITY): Payer: Self-pay

## 2015-09-27 ENCOUNTER — Encounter (HOSPITAL_COMMUNITY): Payer: 59 | Admitting: Physical Therapy

## 2015-09-27 ENCOUNTER — Ambulatory Visit (HOSPITAL_COMMUNITY): Payer: 59 | Admitting: Physical Therapy

## 2015-09-27 NOTE — Telephone Encounter (Signed)
09/27/15 cx - said he was having issues with his hip and saw his dr and can't come in

## 2015-10-02 ENCOUNTER — Encounter (HOSPITAL_COMMUNITY): Payer: 59

## 2015-10-02 ENCOUNTER — Telehealth (HOSPITAL_COMMUNITY): Payer: Self-pay

## 2015-10-02 ENCOUNTER — Ambulatory Visit: Payer: Medicare Other | Admitting: Pulmonary Disease

## 2015-10-02 NOTE — Telephone Encounter (Signed)
10/02/15 cx - said he couldn't do therapy with his leg all swelled up

## 2015-10-04 ENCOUNTER — Encounter (HOSPITAL_COMMUNITY): Payer: 59 | Admitting: Physical Therapy

## 2015-10-04 ENCOUNTER — Telehealth (HOSPITAL_COMMUNITY): Payer: Self-pay

## 2015-10-04 NOTE — Telephone Encounter (Signed)
10/04/15 he cx appts up until 5/23 he said that is when he sees his dr again

## 2015-10-09 ENCOUNTER — Encounter (HOSPITAL_COMMUNITY): Payer: 59 | Admitting: Physical Therapy

## 2015-10-11 ENCOUNTER — Encounter (HOSPITAL_COMMUNITY): Payer: 59

## 2015-10-16 ENCOUNTER — Encounter (HOSPITAL_COMMUNITY): Payer: 59 | Admitting: Physical Therapy

## 2015-10-18 ENCOUNTER — Encounter (HOSPITAL_COMMUNITY): Payer: 59 | Admitting: Physical Therapy

## 2015-10-23 ENCOUNTER — Encounter (HOSPITAL_COMMUNITY): Payer: 59 | Admitting: Physical Therapy

## 2015-10-25 ENCOUNTER — Ambulatory Visit (HOSPITAL_COMMUNITY): Payer: 59 | Admitting: Physical Therapy

## 2015-10-30 ENCOUNTER — Encounter (HOSPITAL_COMMUNITY): Payer: 59 | Admitting: Physical Therapy

## 2015-11-06 ENCOUNTER — Encounter (HOSPITAL_COMMUNITY): Payer: 59 | Admitting: Physical Therapy

## 2016-04-29 ENCOUNTER — Encounter (INDEPENDENT_AMBULATORY_CARE_PROVIDER_SITE_OTHER): Payer: Self-pay | Admitting: Internal Medicine

## 2016-04-29 ENCOUNTER — Encounter (INDEPENDENT_AMBULATORY_CARE_PROVIDER_SITE_OTHER): Payer: Self-pay

## 2016-05-08 ENCOUNTER — Other Ambulatory Visit: Payer: Self-pay | Admitting: Orthopedic Surgery

## 2016-05-08 DIAGNOSIS — M25561 Pain in right knee: Secondary | ICD-10-CM

## 2016-05-19 ENCOUNTER — Telehealth (INDEPENDENT_AMBULATORY_CARE_PROVIDER_SITE_OTHER): Payer: Self-pay | Admitting: Internal Medicine

## 2016-05-19 NOTE — Telephone Encounter (Signed)
Patient's spouse called and stated that the patient has been sick since yesterday and throwing up.  He can't take nausea medication.  She stated that Dr. Laural Golden is his doctor and would like to see if there is something else that might help.  [phone]

## 2016-05-19 NOTE — Telephone Encounter (Signed)
Dr.Rehman is out of office this afternoon. Provider that was here has left. Patient should go to the ED for evaluation or call PCP.

## 2016-05-20 ENCOUNTER — Emergency Department (HOSPITAL_COMMUNITY)
Admission: EM | Admit: 2016-05-20 | Discharge: 2016-05-20 | Disposition: A | Discharge status: Home / Self Care | Payer: Medicare Other | Attending: Emergency Medicine | Admitting: Emergency Medicine

## 2016-05-20 ENCOUNTER — Emergency Department (HOSPITAL_COMMUNITY): Payer: Medicare Other

## 2016-05-20 ENCOUNTER — Encounter (HOSPITAL_COMMUNITY): Payer: Self-pay

## 2016-05-20 DIAGNOSIS — Z79899 Other long term (current) drug therapy: Secondary | ICD-10-CM | POA: Diagnosis not present

## 2016-05-20 DIAGNOSIS — Z87891 Personal history of nicotine dependence: Secondary | ICD-10-CM | POA: Insufficient documentation

## 2016-05-20 DIAGNOSIS — R1110 Vomiting, unspecified: Secondary | ICD-10-CM | POA: Diagnosis present

## 2016-05-20 DIAGNOSIS — E860 Dehydration: Secondary | ICD-10-CM

## 2016-05-20 DIAGNOSIS — I10 Essential (primary) hypertension: Secondary | ICD-10-CM | POA: Diagnosis not present

## 2016-05-20 LAB — CBC WITH DIFFERENTIAL/PLATELET
Basophils Absolute: 0 10*3/uL (ref 0.0–0.1)
Basophils Relative: 0 %
Eosinophils Absolute: 0 10*3/uL (ref 0.0–0.7)
Eosinophils Relative: 0 %
HCT: 45.7 % (ref 39.0–52.0)
Hemoglobin: 15.5 g/dL (ref 13.0–17.0)
Lymphocytes Relative: 28 %
Lymphs Abs: 1.4 10*3/uL (ref 0.7–4.0)
MCH: 31.6 pg (ref 26.0–34.0)
MCHC: 33.9 g/dL (ref 30.0–36.0)
MCV: 93.3 fL (ref 78.0–100.0)
Monocytes Absolute: 0.3 10*3/uL (ref 0.1–1.0)
Monocytes Relative: 6 %
Neutro Abs: 3.1 10*3/uL (ref 1.7–7.7)
Neutrophils Relative %: 66 %
Platelets: 125 10*3/uL — ABNORMAL LOW (ref 150–400)
RBC: 4.9 MIL/uL (ref 4.22–5.81)
RDW: 13.2 % (ref 11.5–15.5)
WBC: 4.8 10*3/uL (ref 4.0–10.5)

## 2016-05-20 LAB — COMPREHENSIVE METABOLIC PANEL
ALT: 21 U/L (ref 17–63)
AST: 25 U/L (ref 15–41)
Albumin: 4 g/dL (ref 3.5–5.0)
Alkaline Phosphatase: 56 U/L (ref 38–126)
Anion gap: 9 (ref 5–15)
BUN: 20 mg/dL (ref 6–20)
CO2: 23 mmol/L (ref 22–32)
Calcium: 8.8 mg/dL — ABNORMAL LOW (ref 8.9–10.3)
Chloride: 104 mmol/L (ref 101–111)
Creatinine, Ser: 1 mg/dL (ref 0.61–1.24)
GFR calc Af Amer: >60 mL/min (ref >60)
GFR calc non Af Amer: >60 mL/min (ref >60)
Glucose, Bld: 93 mg/dL (ref 65–99)
Potassium: 3.3 mmol/L — ABNORMAL LOW (ref 3.5–5.1)
Sodium: 136 mmol/L (ref 135–145)
Total Bilirubin: 0.8 mg/dL (ref 0.3–1.2)
Total Protein: 7.2 g/dL (ref 6.5–8.1)

## 2016-05-20 LAB — LIPASE, BLOOD: Lipase: 27 U/L (ref 11–51)

## 2016-05-20 MED ORDER — SODIUM CHLORIDE 0.9 % IV BOLUS (SEPSIS)
1000.0000 mL | Freq: Once | INTRAVENOUS | Status: AC
  Administered 2016-05-20: 1000 mL via INTRAVENOUS

## 2016-05-20 MED ORDER — ONDANSETRON HCL 4 MG/2ML IJ SOLN
4.0000 mg | Freq: Once | INTRAMUSCULAR | Status: AC
  Administered 2016-05-20: 4 mg via INTRAVENOUS
  Filled 2016-05-20: qty 2

## 2016-05-20 MED ORDER — ONDANSETRON 4 MG PO TBDP: ORAL_TABLET | ORAL | 0 refills | Status: DC

## 2016-05-20 MED ORDER — FAMOTIDINE IN NACL 20-0.9 MG/50ML-% IV SOLN
20.0000 mg | Freq: Once | INTRAVENOUS | Status: AC
  Administered 2016-05-20: 20 mg via INTRAVENOUS
  Filled 2016-05-20: qty 50

## 2016-05-20 NOTE — ED Triage Notes (Signed)
Pt c/o n/v x 3 or 4 days.  Denies diarrhea, denies abd pain.  LBM  Was yesterday and was normal per pt.

## 2016-05-20 NOTE — Discharge Instructions (Signed)
Follow-up with Dr.Rehmann if continued problems

## 2016-05-20 NOTE — ED Provider Notes (Signed)
[facility] DEPT Provider Note   CSN: [id number] Arrival date & time: 05/20/16  [place]     History   Chief Complaint Chief Complaint  Patient presents with  . Emesis    HPI [name] is a 65 y.o. male.  Patient presents with vomiting. Patient has a history of severe GERD and esophageal problems. He spoke with his GI doctor who started Carafate today   The history is provided by the patient. No language interpreter was used.  Emesis   This is a recurrent problem. The current episode started 12 to 24 hours ago. The problem occurs more than 10 times per day. The problem has not changed since onset.The emesis has an appearance of stomach contents. There has been no fever. Pertinent negatives include no abdominal pain, no chills, no cough, no diarrhea and no headaches.    Past Medical History:  Diagnosis Date  . Arthritis   . Barrett esophagus   . Blood transfusion   . Cancer ([place]) 2007   melanoma stage 3 stomach bx  . GERD (gastroesophageal reflux disease)    barretts esophagus  . H/O hiatal hernia   . Headache(784.0)   . Hypertension   . PONV (postoperative nausea and vomiting)    trouble breathing after last surgery  . Shingles    10  . Sleep apnea    not tested  . Tumor liver    no problems    Patient Active Problem List   Diagnosis Date Noted  . OSA (obstructive sleep apnea) 11/18/2013  . Liver lesion 03/01/2013  . Barrett's esophagus 03/01/2013  . GERD (gastroesophageal reflux disease) 03/01/2013  . Essential hypertension, benign 03/01/2013  . S/P lumbar fusion 03/25/2012  . Back pain without radiation 03/25/2012    Past Surgical History:  Procedure Laterality Date  . ABDOMINAL SURGERY     x3  . back surgeries     x 9  . BACK SURGERY     x8  . BIOPSY N/A 03/28/2013   Procedure: Distal Esophageal Biopsy;  Surgeon: Rogene Houston, MD;  Location: [facility];  Service: Endoscopy;  Laterality: N/A;  . CHEST SURGERY     lung incisional hernia for  hernia  . ESOPHAGOGASTRODUODENOSCOPY (EGD) WITH PROPOFOL N/A 03/28/2013   Procedure: ESOPHAGOGASTRODUODENOSCOPY (EGD) WITH PROPOFOL;  Surgeon: Rogene Houston, MD;  Location: [facility];  Service: Endoscopy;  Laterality: N/A;  GE junction @ 38, proximal  margin @37   . HEMORRHOID SURGERY     x2  . HERNIA REPAIR    . HIATAL HERNIA REPAIR    . JOINT REPLACEMENT     lft knee  . KNEE ARTHROPLASTY     lft  . LEG SURGERY     x4 crushed   . LITHOTRIPSY    . MELANOMA EXCISION     lft abdomen  . NECK SURGERY  2012  . POSTERIOR CERVICAL FUSION/FORAMINOTOMY  08/18/2011   Procedure: POSTERIOR CERVICAL FUSION/FORAMINOTOMY LEVEL 3;  Surgeon: Ophelia Charter, MD;  Location: [place] [facility] [facility];  Service: Neurosurgery;  Laterality: N/A;  Cervical three-four, four-five, five-six posterior cervical  fusion with instrumentation; Right cervical three-four, four-five laminotomy  . SHOULDER ARTHROSCOPY     rt bith defect , spur, rotator cuff  . SPINAL CORD STIMULATOR IMPLANT     rt hip  . THYROID LOBECTOMY     rt       Home Medications    Prior to Admission medications   Medication Sig Start Date End Date Taking?  Authorizing Provider  calcium-vitamin D (OSCAL WITH D) 500-200 MG-UNIT per tablet Take 1 tablet by mouth 2 (two) times daily.    Yes Historical Provider, MD  dexlansoprazole (DEXILANT) 60 MG capsule Take 60 mg by mouth daily.   Yes Historical Provider, MD  diazepam (VALIUM) 10 MG tablet Take 10 mg by mouth daily as needed. Muscle spasms   Yes Historical Provider, MD  fexofenadine (ALLEGRA) 180 MG tablet Take 180 mg by mouth every evening.   Yes Historical Provider, MD  fish oil-omega-3 fatty acids 1000 MG capsule Take 1 g by mouth 2 (two) times daily.   Yes Historical Provider, MD  gabapentin (NEURONTIN) 600 MG tablet Take 600 mg by mouth 3 (three) times daily.   Yes Historical Provider, MD  lisinopril (PRINIVIL,ZESTRIL) 10 MG tablet Take 10 mg by mouth daily.   Yes Historical Provider, MD    montelukast (SINGULAIR) 10 MG tablet Take 10 mg by mouth daily.    Yes Historical Provider, MD  Multiple Vitamin (MULITIVITAMIN WITH MINERALS) TABS Take 1 tablet by mouth daily.   Yes Historical Provider, MD  oxyCODONE-acetaminophen (PERCOCET) 10-325 MG tablet Take 1 tablet by mouth every 4 (four) hours as needed for pain.   Yes Historical Provider, MD  rosuvastatin (CRESTOR) 10 MG tablet Take 10 mg by mouth every morning.    Yes Historical Provider, MD  ondansetron (ZOFRAN ODT) 4 MG disintegrating tablet 4mg  ODT q4 hours prn nausea/vomit 05/20/16   Milton Ferguson, MD    Family History Family History  Problem Relation Age of Onset  . Heart disease Father   . Heart disease Paternal Grandmother   . Heart disease Paternal Grandfather   . Heart disease Maternal Grandfather   . Heart disease Brother     heart transplant  . Aneurysm      pt states several members have aortic aneurysm  . Skin cancer      several members  . Lymphoma Brother     Social History Social History  Substance Use Topics  . Smoking status: Former Smoker    Packs/day: 4.00    Years: 10.00    Types: Cigarettes    Quit date: 08/12/1980  . Smokeless tobacco: Former Systems developer     Comment: occ wine  . Alcohol use No     Allergies   Demerol   Review of Systems Review of Systems  Constitutional: Negative for appetite change, chills and fatigue.  HENT: Negative for congestion, ear discharge and sinus pressure.   Eyes: Negative for discharge.  Respiratory: Negative for cough.   Cardiovascular: Negative for chest pain.  Gastrointestinal: Positive for vomiting. Negative for abdominal pain and diarrhea.  Genitourinary: Negative for frequency and hematuria.  Musculoskeletal: Negative for back pain.  Skin: Negative for rash.  Neurological: Negative for seizures and headaches.  Psychiatric/Behavioral: Negative for hallucinations.     Physical Exam Updated Vital Signs BP 140/97 (BP Location: Right Arm)   Pulse  (!) 58   Temp 98.1 F (36.7 C) (Oral)   Resp 20   Ht 6' (1.829 m)   Wt 225 lb (102.1 kg)   SpO2 94%   BMI 30.52 kg/m   Physical Exam  Constitutional: He is oriented to person, place, and time. He appears well-developed.  HENT:  Head: Normocephalic.  Eyes: Conjunctivae and EOM are normal. No scleral icterus.  Neck: Neck supple. No thyromegaly present.  Cardiovascular: Normal rate and regular rhythm.  Exam reveals no gallop and no friction rub.   No murmur  heard. Pulmonary/Chest: No stridor. He has no wheezes. He has no rales. He exhibits no tenderness.  Abdominal: He exhibits no distension. There is tenderness. There is no rebound.  Mild epigastric tenderness  Musculoskeletal: Normal range of motion. He exhibits no edema.  Lymphadenopathy:    He has no cervical adenopathy.  Neurological: He is oriented to person, place, and time. He exhibits normal muscle tone. Coordination normal.  Skin: No rash noted. No erythema.  Psychiatric: He has a normal mood and affect. His behavior is normal.     ED Treatments / Results  Labs (all labs ordered are listed, but only abnormal results are displayed) Labs Reviewed  CBC WITH DIFFERENTIAL/PLATELET - Abnormal; Notable for the following:       Result Value   Platelets 125 (*)    All other components within normal limits  COMPREHENSIVE METABOLIC PANEL - Abnormal; Notable for the following:    Potassium 3.3 (*)    Calcium 8.8 (*)    All other components within normal limits  LIPASE, BLOOD    EKG  EKG Interpretation None       Radiology Dg Abd Acute W/chest  Result Date: 05/20/2016 CLINICAL DATA:  Abdominal pain with vomiting and nausea EXAM: DG ABDOMEN ACUTE W/ 1V CHEST COMPARISON:  05/15/2014, 06/05/2014 FINDINGS: Surgical hardware within the cervical and upper thoracic spine. No acute infiltrate or effusion. Mild linear atelectasis at the left lung base and lingula. Stable cardiomediastinal silhouette. Thoracic stimulator.  Supine and upright views of the abdomen demonstrate no free air beneath the diaphragm. Stimulator generator over the upper pelvis. Lumbar spine surgical hardware. Bowel-gas pattern is non obstructed. No abnormal calcifications. Partially visualized left hip replacement. Calcified pelvic phleboliths. IMPRESSION: 1. No radiographic evidence for acute cardiopulmonary abnormality 2. Nonobstructed gas pattern Electronically Signed   By: Donavan Foil M.D.   On: 05/20/2016 19:28    Procedures Procedures (including critical care time)  Medications Ordered in ED Medications  sodium chloride 0.9 % bolus 1,000 mL (0 mLs Intravenous Stopped 05/20/16 1843)  ondansetron (ZOFRAN) injection 4 mg (4 mg Intravenous Given 05/20/16 1743)  famotidine (PEPCID) IVPB 20 mg premix (0 mg Intravenous Stopped 05/20/16 1950)  sodium chloride 0.9 % bolus 1,000 mL (1,000 mLs Intravenous New Bag/Given 05/20/16 1951)     Initial Impression / Assessment and Plan / ED Course  I have reviewed the triage vital signs and the nursing notes.  Pertinent labs & imaging results that were available during my care of the patient were reviewed by me and considered in my medical decision making (see chart for details).  Clinical Course     Labs x-ray unremarkable. Patient improved with fluids and Zofran and Pepcid. He is going to get his Carafate prescription filled. Patient given Zofran at home and will follow-up with his GI doctor  Final Clinical Impressions(s) / ED Diagnoses   Final diagnoses:  Dehydration    New Prescriptions New Prescriptions   ONDANSETRON (ZOFRAN ODT) 4 MG DISINTEGRATING TABLET    4mg  ODT q4 hours prn nausea/vomit     Milton Ferguson, MD 05/20/16 2036

## 2016-05-20 NOTE — Telephone Encounter (Signed)
I did not see the response to this encounter until this morning.  Do you want to ask Dr. Laural Golden this morning?

## 2016-05-20 NOTE — Telephone Encounter (Signed)
Per Dr.Rehman  May call in the Carafate 1 gram take po QID 1 month with 1 refill. Zofran 4 mg ODT po TID prn nausea. #30. No refills  This was called to the patient's pharmacy [facility]. Patient is aware.

## 2016-05-20 NOTE — Telephone Encounter (Signed)
I called the patient back to let him know that we are checking with Dr. Laural Golden and he was very appreciative.  He stated that he thinks that maybe he has a bug or virus and that Dr. Laural Golden doesn't want him taking nausea medication due to his prior stomach surgeries.  He would like Dr. Laural Golden to call something in for him to help with what he had going on right now.  [phone]

## 2016-05-22 ENCOUNTER — Emergency Department (HOSPITAL_COMMUNITY)
Admission: EM | Admit: 2016-05-22 | Discharge: 2016-05-22 | Disposition: A | Discharge status: Home / Self Care | Payer: Medicare Other | Attending: Emergency Medicine | Admitting: Emergency Medicine

## 2016-05-22 ENCOUNTER — Encounter (HOSPITAL_COMMUNITY): Payer: Self-pay | Admitting: Emergency Medicine

## 2016-05-22 ENCOUNTER — Emergency Department (HOSPITAL_COMMUNITY): Payer: Medicare Other

## 2016-05-22 ENCOUNTER — Telehealth (INDEPENDENT_AMBULATORY_CARE_PROVIDER_SITE_OTHER): Payer: Self-pay | Admitting: Internal Medicine

## 2016-05-22 DIAGNOSIS — Z85828 Personal history of other malignant neoplasm of skin: Secondary | ICD-10-CM | POA: Diagnosis not present

## 2016-05-22 DIAGNOSIS — Z79899 Other long term (current) drug therapy: Secondary | ICD-10-CM | POA: Insufficient documentation

## 2016-05-22 DIAGNOSIS — R112 Nausea with vomiting, unspecified: Secondary | ICD-10-CM | POA: Insufficient documentation

## 2016-05-22 DIAGNOSIS — Z87891 Personal history of nicotine dependence: Secondary | ICD-10-CM | POA: Insufficient documentation

## 2016-05-22 DIAGNOSIS — I10 Essential (primary) hypertension: Secondary | ICD-10-CM | POA: Diagnosis not present

## 2016-05-22 DIAGNOSIS — Z96652 Presence of left artificial knee joint: Secondary | ICD-10-CM | POA: Diagnosis not present

## 2016-05-22 DIAGNOSIS — R1084 Generalized abdominal pain: Secondary | ICD-10-CM | POA: Insufficient documentation

## 2016-05-22 DIAGNOSIS — R109 Unspecified abdominal pain: Secondary | ICD-10-CM

## 2016-05-22 LAB — CBC WITH DIFFERENTIAL/PLATELET
Basophils Absolute: 0 10*3/uL (ref 0.0–0.1)
Basophils Relative: 0 %
Eosinophils Absolute: 0 10*3/uL (ref 0.0–0.7)
Eosinophils Relative: 0 %
HCT: 45.6 % (ref 39.0–52.0)
Hemoglobin: 15.1 g/dL (ref 13.0–17.0)
Lymphocytes Relative: 26 %
Lymphs Abs: 1.1 10*3/uL (ref 0.7–4.0)
MCH: 31.2 pg (ref 26.0–34.0)
MCHC: 33.1 g/dL (ref 30.0–36.0)
MCV: 94.2 fL (ref 78.0–100.0)
Monocytes Absolute: 0.2 10*3/uL (ref 0.1–1.0)
Monocytes Relative: 5 %
Neutro Abs: 2.8 10*3/uL (ref 1.7–7.7)
Neutrophils Relative %: 69 %
Platelets: 118 10*3/uL — ABNORMAL LOW (ref 150–400)
RBC: 4.84 MIL/uL (ref 4.22–5.81)
RDW: 13 % (ref 11.5–15.5)
WBC: 4.1 10*3/uL (ref 4.0–10.5)

## 2016-05-22 LAB — COMPREHENSIVE METABOLIC PANEL
ALT: 19 U/L (ref 17–63)
AST: 26 U/L (ref 15–41)
Albumin: 3.9 g/dL (ref 3.5–5.0)
Alkaline Phosphatase: 54 U/L (ref 38–126)
Anion gap: 8 (ref 5–15)
BUN: 16 mg/dL (ref 6–20)
CO2: 26 mmol/L (ref 22–32)
Calcium: 8.8 mg/dL — ABNORMAL LOW (ref 8.9–10.3)
Chloride: 104 mmol/L (ref 101–111)
Creatinine, Ser: 0.95 mg/dL (ref 0.61–1.24)
GFR calc Af Amer: >60 mL/min (ref >60)
GFR calc non Af Amer: >60 mL/min (ref >60)
Glucose, Bld: 114 mg/dL — ABNORMAL HIGH (ref 65–99)
Potassium: 3.5 mmol/L (ref 3.5–5.1)
Sodium: 138 mmol/L (ref 135–145)
Total Bilirubin: 0.7 mg/dL (ref 0.3–1.2)
Total Protein: 7 g/dL (ref 6.5–8.1)

## 2016-05-22 LAB — URINALYSIS, ROUTINE W REFLEX MICROSCOPIC
Bilirubin Urine: NEGATIVE
Glucose, UA: NEGATIVE mg/dL
Hgb urine dipstick: NEGATIVE
Ketones, ur: NEGATIVE mg/dL
Leukocytes, UA: NEGATIVE
Nitrite: NEGATIVE
Protein, ur: NEGATIVE mg/dL
Specific Gravity, Urine: 1.015 (ref 1.005–1.030)
pH: 8 (ref 5.0–8.0)

## 2016-05-22 LAB — LIPASE, BLOOD: Lipase: 23 U/L (ref 11–51)

## 2016-05-22 MED ORDER — METOCLOPRAMIDE HCL 5 MG/ML IJ SOLN
10.0000 mg | Freq: Once | INTRAMUSCULAR | Status: AC
  Administered 2016-05-22: 10 mg via INTRAVENOUS
  Filled 2016-05-22: qty 2

## 2016-05-22 MED ORDER — MORPHINE SULFATE (PF) 4 MG/ML IV SOLN
4.0000 mg | INTRAVENOUS | Status: DC | PRN
  Filled 2016-05-22: qty 1

## 2016-05-22 MED ORDER — METOCLOPRAMIDE HCL 10 MG PO TABS: 10.0000 mg | ORAL_TABLET | Freq: Four times a day (QID) | ORAL | 0 refills | Status: DC | PRN

## 2016-05-22 MED ORDER — IOPAMIDOL (ISOVUE-300) INJECTION 61%
100.0000 mL | Freq: Once | INTRAVENOUS | Status: AC | PRN
  Administered 2016-05-22: 100 mL via INTRAVENOUS

## 2016-05-22 MED ORDER — FAMOTIDINE IN NACL 20-0.9 MG/50ML-% IV SOLN
20.0000 mg | Freq: Once | INTRAVENOUS | Status: AC
  Administered 2016-05-22: 20 mg via INTRAVENOUS
  Filled 2016-05-22: qty 50

## 2016-05-22 NOTE — ED Notes (Signed)
Pt made aware to return if symptoms worsen or if any life threatening symptoms occur.   

## 2016-05-22 NOTE — ED Triage Notes (Signed)
Pt c/o n/v since Sunday. Pt was seen here on Tuesday and given medications. Per fiancee pt has not been any better since his last visit.

## 2016-05-22 NOTE — ED Notes (Signed)
Pt refuses morphine at this time

## 2016-05-22 NOTE — Telephone Encounter (Signed)
Someone (male) called for the patient twice this afternoon.  She stated that he went to the hospital Tuesday afternoon to have x-rays done.  She stated that he is so sick on his stomach and is in tears.  The patient really wants to speak to Dr. Laural Golden.  [phone]

## 2016-05-22 NOTE — ED Provider Notes (Signed)
[place] DEPT Provider Note   CSN: [id number] Arrival date & time: 05/22/16  1350     History   Chief Complaint Chief Complaint  Patient presents with  . Abdominal Pain    HPI [name] is a 65 y.o. male.  HPI  Pt was seen at 1440.  Per pt, c/o gradual onset and persistence of constant generalized abd "pain" for the past 4 days.  Has been associated with multiple intermittent episodes of N/V. N/V worsens when he sits up, improves when he lays down. Pt was evaluated in the ED 2 days ago for the same symptoms, rx phenergan and carafate. Pt states he has been taking both without improvement.  Denies diarrhea, no fevers, no back pain, no rash, no CP/SOB, no black or blood in stools or emesis.      Past Medical History:  Diagnosis Date  . Arthritis   . Barrett esophagus   . Blood transfusion   . Cancer ([place]) 2007   melanoma stage 3 stomach bx  . GERD (gastroesophageal reflux disease)    barretts esophagus  . H/O hiatal hernia   . Headache(784.0)   . Hypertension   . PONV (postoperative nausea and vomiting)    trouble breathing after last surgery  . Shingles    10  . Sleep apnea    not tested  . Tumor liver    no problems    Patient Active Problem List   Diagnosis Date Noted  . OSA (obstructive sleep apnea) 11/18/2013  . Liver lesion 03/01/2013  . Barrett's esophagus 03/01/2013  . GERD (gastroesophageal reflux disease) 03/01/2013  . Essential hypertension, benign 03/01/2013  . S/P lumbar fusion 03/25/2012  . Back pain without radiation 03/25/2012    Past Surgical History:  Procedure Laterality Date  . ABDOMINAL SURGERY     x3  . back surgeries     x 9  . BACK SURGERY     x8  . BIOPSY N/A 03/28/2013   Procedure: Distal Esophageal Biopsy;  Surgeon: Rogene Houston, MD;  Location: [facility];  Service: Endoscopy;  Laterality: N/A;  . CHEST SURGERY     lung incisional hernia for hernia  . ESOPHAGOGASTRODUODENOSCOPY (EGD) WITH PROPOFOL N/A 03/28/2013   Procedure: ESOPHAGOGASTRODUODENOSCOPY (EGD) WITH PROPOFOL;  Surgeon: Rogene Houston, MD;  Location: [facility];  Service: Endoscopy;  Laterality: N/A;  GE junction @ 38, proximal  margin @37   . HEMORRHOID SURGERY     x2  . HERNIA REPAIR    . HIATAL HERNIA REPAIR    . JOINT REPLACEMENT     lft knee  . KNEE ARTHROPLASTY     lft  . LEG SURGERY     x4 crushed   . LITHOTRIPSY    . MELANOMA EXCISION     lft abdomen  . NECK SURGERY  2012  . POSTERIOR CERVICAL FUSION/FORAMINOTOMY  08/18/2011   Procedure: POSTERIOR CERVICAL FUSION/FORAMINOTOMY LEVEL 3;  Surgeon: Ophelia Charter, MD;  Location: [facility] [facility];  Service: Neurosurgery;  Laterality: N/A;  Cervical three-four, four-five, five-six posterior cervical  fusion with instrumentation; Right cervical three-four, four-five laminotomy  . SHOULDER ARTHROSCOPY     rt bith defect , spur, rotator cuff  . SPINAL CORD STIMULATOR IMPLANT     rt hip  . THYROID LOBECTOMY     rt       Home Medications    Prior to Admission medications   Medication Sig Start Date End Date Taking? Authorizing Provider  calcium-vitamin D (  OSCAL WITH D) 500-200 MG-UNIT per tablet Take 1 tablet by mouth 2 (two) times daily.     Historical Provider, MD  dexlansoprazole (DEXILANT) 60 MG capsule Take 60 mg by mouth daily.    Historical Provider, MD  diazepam (VALIUM) 10 MG tablet Take 10 mg by mouth daily as needed. Muscle spasms    Historical Provider, MD  fexofenadine (ALLEGRA) 180 MG tablet Take 180 mg by mouth every evening.    Historical Provider, MD  fish oil-omega-3 fatty acids 1000 MG capsule Take 1 g by mouth 2 (two) times daily.    Historical Provider, MD  gabapentin (NEURONTIN) 600 MG tablet Take 600 mg by mouth 3 (three) times daily.    Historical Provider, MD  lisinopril (PRINIVIL,ZESTRIL) 10 MG tablet Take 10 mg by mouth daily.    Historical Provider, MD  montelukast (SINGULAIR) 10 MG tablet Take 10 mg by mouth daily.     Historical Provider, MD  Multiple  Vitamin (MULITIVITAMIN WITH MINERALS) TABS Take 1 tablet by mouth daily.    Historical Provider, MD  ondansetron (ZOFRAN ODT) 4 MG disintegrating tablet 4mg  ODT q4 hours prn nausea/vomit 05/20/16   Milton Ferguson, MD  oxyCODONE-acetaminophen (PERCOCET) 10-325 MG tablet Take 1 tablet by mouth every 4 (four) hours as needed for pain.    Historical Provider, MD  rosuvastatin (CRESTOR) 10 MG tablet Take 10 mg by mouth every morning.     Historical Provider, MD    Family History Family History  Problem Relation Age of Onset  . Heart disease Father   . Heart disease Paternal Grandmother   . Heart disease Paternal Grandfather   . Heart disease Maternal Grandfather   . Heart disease Brother     heart transplant  . Aneurysm      pt states several members have aortic aneurysm  . Skin cancer      several members  . Lymphoma Brother     Social History Social History  Substance Use Topics  . Smoking status: Former Smoker    Packs/day: 4.00    Years: 10.00    Types: Cigarettes    Quit date: 08/12/1980  . Smokeless tobacco: Former Systems developer     Comment: occ wine  . Alcohol use No     Allergies   Demerol   Review of Systems Review of Systems ROS: Statement: All systems negative except as marked or noted in the HPI; Constitutional: Negative for fever and chills. ; ; Eyes: Negative for eye pain, redness and discharge. ; ; ENMT: Negative for ear pain, hoarseness, nasal congestion, sinus pressure and sore throat. ; ; Cardiovascular: Negative for chest pain, palpitations, diaphoresis, dyspnea and peripheral edema. ; ; Respiratory: Negative for cough, wheezing and stridor. ; ; Gastrointestinal: +N/V, abd pain. Negative for diarrhea, blood in stool, hematemesis, jaundice and rectal bleeding. . ; ; Genitourinary: Negative for dysuria, flank pain and hematuria. ; ; Musculoskeletal: Negative for back pain and neck pain. Negative for swelling and trauma.; ; Skin: Negative for pruritus, rash, abrasions,  blisters, bruising and skin lesion.; ; Neuro: Negative for headache, lightheadedness and neck stiffness. Negative for weakness, altered level of consciousness, altered mental status, extremity weakness, paresthesias, involuntary movement, seizure and syncope.       Physical Exam Updated Vital Signs BP 163/95   Pulse (!) 58   Resp 22   Ht 6\' 2"  (1.88 m)   Wt 225 lb (102.1 kg)   SpO2 96%   BMI 28.89 kg/m   Physical Exam  1445: Physical examination:  Nursing notes reviewed; Vital signs and O2 SAT reviewed;  Constitutional: Well developed, Well nourished, Well hydrated, Uncomfortable appearing.; Head:  Normocephalic, atraumatic; Eyes: EOMI, PERRL, No scleral icterus; ENMT: Mouth and pharynx normal, Mucous membranes moist; Neck: Supple, Full range of motion, No lymphadenopathy; Cardiovascular: Regular rate and rhythm, No gallop; Respiratory: Breath sounds clear & equal bilaterally, No wheezes.  Speaking full sentences with ease, Normal respiratory effort/excursion; Chest: Nontender, Movement normal; Abdomen: Soft, +diffuse tenderness to palp. No rebound or guarding. Nondistended, Normal bowel sounds; Genitourinary: No CVA tenderness; Extremities: Pulses normal, No tenderness, No edema, No calf edema or asymmetry.; Neuro: AA&Ox3, Major CN grossly intact.  Speech clear. No gross focal motor or sensory deficits in extremities.; Skin: Color normal, Warm, Dry.   ED Treatments / Results  Labs (all labs ordered are listed, but only abnormal results are displayed)   EKG  EKG Interpretation None       Radiology   Procedures Procedures (including critical care time)  Medications Ordered in ED Medications  metoCLOPramide (REGLAN) injection 10 mg (not administered)  famotidine (PEPCID) IVPB 20 mg premix (not administered)  morphine 4 MG/ML injection 4 mg (not administered)     Initial Impression / Assessment and Plan / ED Course  I have reviewed the triage vital signs and the nursing  notes.  Pertinent labs & imaging results that were available during my care of the patient were reviewed by me and considered in my medical decision making (see chart for details).  MDM Reviewed: nursing note, previous chart and vitals Reviewed previous: labs and x-ray Interpretation: labs, x-ray and CT scan   Results for orders placed or performed during the hospital encounter of 05/22/16  Comprehensive metabolic panel  Result Value Ref Range   Sodium 138 135 - 145 mmol/L   Potassium 3.5 3.5 - 5.1 mmol/L   Chloride 104 101 - 111 mmol/L   CO2 26 22 - 32 mmol/L   Glucose, Bld 114 (H) 65 - 99 mg/dL   BUN 16 6 - 20 mg/dL   Creatinine, Ser 0.95 0.61 - 1.24 mg/dL   Calcium 8.8 (L) 8.9 - 10.3 mg/dL   Total Protein 7.0 6.5 - 8.1 g/dL   Albumin 3.9 3.5 - 5.0 g/dL   AST 26 15 - 41 U/L   ALT 19 17 - 63 U/L   Alkaline Phosphatase 54 38 - 126 U/L   Total Bilirubin 0.7 0.3 - 1.2 mg/dL   GFR calc non Af Amer >60 >60 mL/min   GFR calc Af Amer >60 >60 mL/min   Anion gap 8 5 - 15  Lipase, blood  Result Value Ref Range   Lipase 23 11 - 51 U/L  CBC with Differential  Result Value Ref Range   WBC 4.1 4.0 - 10.5 K/uL   RBC 4.84 4.22 - 5.81 MIL/uL   Hemoglobin 15.1 13.0 - 17.0 g/dL   HCT 45.6 39.0 - 52.0 %   MCV 94.2 78.0 - 100.0 fL   MCH 31.2 26.0 - 34.0 pg   MCHC 33.1 30.0 - 36.0 g/dL   RDW 13.0 11.5 - 15.5 %   Platelets 118 (L) 150 - 400 K/uL   Neutrophils Relative % 69 %   Neutro Abs 2.8 1.7 - 7.7 K/uL   Lymphocytes Relative 26 %   Lymphs Abs 1.1 0.7 - 4.0 K/uL   Monocytes Relative 5 %   Monocytes Absolute 0.2 0.1 - 1.0 K/uL   Eosinophils Relative 0 %  Eosinophils Absolute 0.0 0.0 - 0.7 K/uL   Basophils Relative 0 %   Basophils Absolute 0.0 0.0 - 0.1 K/uL  Urinalysis, Routine w reflex microscopic  Result Value Ref Range   Color, Urine YELLOW YELLOW   APPearance CLEAR CLEAR   Specific Gravity, Urine 1.015 1.005 - 1.030   pH 8.0 5.0 - 8.0   Glucose, UA NEGATIVE NEGATIVE mg/dL    Hgb urine dipstick NEGATIVE NEGATIVE   Bilirubin Urine NEGATIVE NEGATIVE   Ketones, ur NEGATIVE NEGATIVE mg/dL   Protein, ur NEGATIVE NEGATIVE mg/dL   Nitrite NEGATIVE NEGATIVE   Leukocytes, UA NEGATIVE NEGATIVE   Dg Chest 2 View Result Date: 05/22/2016 CLINICAL DATA:  Nausea and vomiting for 4 days. EXAM: CHEST  2 VIEW COMPARISON:  Single-view of the chest 05/20/2016. CT chest 07/10/2015. FINDINGS: The lungs are clear. Heart size is upper normal. No pneumothorax or pleural effusion. Spinal stimulator and postoperative change of upper thoracic fusion are again seen. IMPRESSION: No acute disease. Electronically Signed   By: Inge Rise M.D.   On: 05/22/2016 16:31   Ct Abdomen Pelvis W Contrast Result Date: 05/22/2016 CLINICAL DATA:  Generalized abdominal pain, nausea and vomiting for 4 days. Reported history of left abdominal melanoma and hiatal hernia repair. EXAM: CT ABDOMEN AND PELVIS WITH CONTRAST TECHNIQUE: Multidetector CT imaging of the abdomen and pelvis was performed using the standard protocol following bolus administration of intravenous contrast. CONTRAST:  143mL ISOVUE-300 IOPAMIDOL (ISOVUE-300) INJECTION 61% COMPARISON:  06/05/2014 CT angiogram of the abdomen and pelvis. 05/20/2016 abdominal radiographs. FINDINGS: Lower chest: No significant pulmonary nodules or acute consolidative airspace disease. Stable mild cardiomegaly. Coronary atherosclerosis. Oral contrast is seen in the lower thoracic esophagus. Hepatobiliary: Normal liver size. There is a 3.6 x 3.5 cm right liver lobe mass (series 2/ image 13) with heterogeneous nodular enhancement, stable in size since at least 12/20/2013 CT study, suggesting a benign mass, probably a hemangioma. No new liver lesions. Normal gallbladder with no radiopaque cholelithiasis. No biliary ductal dilatation. Pancreas: Normal, with no mass or duct dilation. Spleen: Normal size. No mass. Adrenals/Urinary Tract: Normal adrenals. No hydronephrosis.  Small simple parapelvic renal cysts in both kidneys. Subcentimeter hypodense renal cortical lesions in the medial upper and far lower left kidney are too small to characterize, appear unchanged in the interval and require no further follow-up. No additional renal lesions. Normal bladder. Stomach/Bowel: Stable postsurgical changes from prior hiatal hernia repair with multiple surgical clips, with stable small recurrent hiatal hernia. There are mildly dilated mid small bowel loops throughout the anterior abdomen measuring up to 3.1 cm diameter. Oral contrast progresses to the mid small bowel. No discrete small bowel caliber transition. There is mild stool throughout the distal and terminal ileum suggesting stasis. No small bowel wall thickening. Normal appendix . Mild descending and sigmoid colon diverticulosis, with no large bowel wall thickening or pericolonic fat stranding. Mild-to-moderate stool throughout the large bowel. Vascular/Lymphatic: Atherosclerotic nonaneurysmal abdominal aorta. Patent portal, splenic, hepatic and renal veins. No pathologically enlarged lymph nodes in the abdomen or pelvis. Reproductive: Stable mildly enlarged prostate. Other: No pneumoperitoneum, ascites or focal fluid collection. Hernia repair mesh is noted in the midline ventral supraumbilical abdominal wall, with a stable moderate medial left supraumbilical fat containing ventral abdominal hernia (series 2/ image 33). Spinal stimulator device is noted in the medial right back with single lead coursing superiorly into the thoracic spinal canal with the tip not seen on this study. Musculoskeletal: No aggressive appearing focal osseous lesions. Status post  bilateral posterior spinal fusion from L2-S1. Moderate thoracolumbar spondylosis. Partially visualized left total hip arthroplasty. IMPRESSION: 1. Mildly dilated mid small bowel loops peripheral contrast progresses to the mid small bowel. No discrete small bowel caliber transition. No  small bowel wall thickening. A mild adynamic ileus of the small bowel is favored given the presence of stool throughout the distal and terminal ileum, although a mild mechanical mid small bowel obstruction due to adhesions cannot be entirely excluded. 2. Stable small recurrent hiatal hernia. Oral contrast in the lower thoracic esophagus suggests esophageal dysmotility and/or gastroesophageal reflux. 3. Stable recurrent fat containing ventral hernia in the medial supraumbilical left abdominal wall. 4. Aortic atherosclerosis.  Coronary atherosclerosis. Electronically Signed   By: Ilona Sorrel M.D.   On: 05/22/2016 16:56    1805:  Pt refused morphine for pain, felt better after IV pepcid and reglan.  Offered hospital admission; pt refuses. Pt spoke with is significant other in the room; continues to refuse admission. T/C to GI Dr. Gala Romney, case discussed, including:  HPI, pertinent PM/SHx, VS/PE, dx testing, ED course and treatment:  He will inform Dr. Laural Golden of pt's ED visit today, have pt call office for f/u. Dx and testing, as well as d/w GI MD, d/w pt and family.  Questions answered.  Verb understanding, agreeable to d/c home with outpt f/u.     Final Clinical Impressions(s) / ED Diagnoses   Final diagnoses:  None    New Prescriptions New Prescriptions   No medications on file     Francine Graven, DO 05/26/16 1656

## 2016-05-22 NOTE — ED Notes (Signed)
Pt given ginger ale.

## 2016-05-22 NOTE — Discharge Instructions (Signed)
Take the prescription as directed.  Increase your fluid intake (ie:  Gatoraide) for the next few days.  Eat a bland diet and advance to your regular diet slowly as you can tolerate it. Call your regular GI doctor tomorrow to schedule a follow up appointment in the next 1 to 2 days.  Return to the Emergency Department immediately sooner if worsening.

## 2016-05-22 NOTE — Telephone Encounter (Signed)
Patient is currently in the ED. These messages were left after lunch on the phone this afternoon. Dr.Rehman was called and made aware.

## 2016-05-23 ENCOUNTER — Telehealth: Payer: Self-pay | Admitting: Internal Medicine

## 2016-05-23 ENCOUNTER — Telehealth (INDEPENDENT_AMBULATORY_CARE_PROVIDER_SITE_OTHER): Payer: Self-pay | Admitting: Internal Medicine

## 2016-05-23 NOTE — Telephone Encounter (Signed)
Patient's fiance called and left a message stated that the patient went to the ER yesterday and they told him that he needed to be seen today.  I called the patient back and told him that we do not have a provider here today and would not have one until next Wednesday.  I told him if he needed to be seen before then that he would need to go see his primary care doctor.  He didn't make an appointment, but requested that when we do have a provider in the office, he would like Dr. Laural Golden or Karna Christmas to review his information from his visit at the hospital.  He stated that they suggested some type of test might need to be ordered.  [phone]

## 2016-05-23 NOTE — Telephone Encounter (Signed)
Dr. Thurnell Garbe in the ER called me last night about this patient. Second ED visit this week with upper GI complaints. ED evaluation negative except for CT demonstrating  An ileus by report. She actually offer him hospitalization last night but patient declined.  Patient stated he wants to make contact directly with Dr. Laural Golden.  This communication will be forwarded to Dr. Laural Golden

## 2016-05-23 NOTE — Telephone Encounter (Signed)
I will forward this to [facility] for review.

## 2016-05-24 NOTE — Telephone Encounter (Signed)
I reviewed patient's records and called him. He is feeling better while on liquid diet. He has not experienced fever or diarrhea. If patient had not declined to be hospitalized we could've done further studies on him. See patient next week Wednesday. Hope, please give patient appointment for 03/28/2016 in the afternoon and I will come and see him

## 2016-05-28 ENCOUNTER — Ambulatory Visit (INDEPENDENT_AMBULATORY_CARE_PROVIDER_SITE_OTHER): Payer: Medicare Other | Admitting: Internal Medicine

## 2016-05-28 ENCOUNTER — Encounter (INDEPENDENT_AMBULATORY_CARE_PROVIDER_SITE_OTHER): Payer: Self-pay | Admitting: Internal Medicine

## 2016-05-28 VITALS — BP 122/90 | HR 73 | Temp 98.4°F | Resp 18 | Ht 72.0 in | Wt 230.2 lb

## 2016-05-28 DIAGNOSIS — R112 Nausea with vomiting, unspecified: Secondary | ICD-10-CM

## 2016-05-28 DIAGNOSIS — K219 Gastro-esophageal reflux disease without esophagitis: Secondary | ICD-10-CM | POA: Diagnosis not present

## 2016-05-28 MED ORDER — METOCLOPRAMIDE HCL 10 MG PO TABS: 10.0000 mg | ORAL_TABLET | Freq: Three times a day (TID) | ORAL | 0 refills | Status: DC

## 2016-05-28 NOTE — Telephone Encounter (Signed)
I have scheduled this patient to come in today at 3:30pm.  The patient is aware.

## 2016-05-28 NOTE — Progress Notes (Signed)
Presenting complaint;  Nausea and vomiting.  Database and Subjective:  Patient is 65 year old Caucasian male with complicated GI history who was last seen in the office on 06/28/2015. He has history of chronic GERD and multiple failed surgeries and is back on PPI. He also has history of short segment Barrett's esophagus. Last EGD was in October 2014 and biopsy was negative for Barrett's. EGD in 2008 revealed food debris in stomach raising the concern for gastroparesis. He has been watching his diet.  Patient is here for scheduled visit accompanied by his fiance Manuela Schwartz. He developed nausea and vomiting soon after eating lunch on 05/18/2016. He vomited food and fluid but no blood. He did not experience fever skin rash or diarrhea. He noted loud bowel sounds. He felt better when he would stay still. His nausea and vomiting continued for the next 2 days and finally he decided to go to emergency room on 05/20/2016 when he felt he was dehydrated. He was treated with IV fluids. Lab studies were unremarkable except for serum potassium of 3.3. Serum lipase was normal. He was felt to have peptic ulcer disease. He was discharged and advised to arrange for GI visit. However nausea and vomiting persisted and he returned to emergency room on 05/22/2016. He states he was weak and lightheaded and fell in emergency room. This time he had abdominopelvic CT in addition to blood work. Lab studies are unremarkable. Abdominopelvic CT reveals mildly dilated loops of small bowel. He also had hepatic lesion which previously has been categorized as benign possibly hemangioma. Patient was advised admission but he declined. He was given prescription for Zofran and also given age doses of metoclopramide. He has been on a full liquid diet. He is starting to feel better. Last time he vomited was 2 days ago. He has taken metoclopramide which has helped his nausea significantly. He has not experienced any side effects. He denies melena or  rectal bleeding. He states he has had formed stools for the last 2 days.  He states he is been having spells of nausea and vomiting couple of times a month and he also has frequent burping. Sometimes his burps our files smelling. He stated he had lost few pounds with this acute illness but he is back to his baseline. He does not take OTC NSAIDs. He takes Percocet for back pain occasionally and he takes Valium at bedtime for back spasms and it helps him to sleep.  Patient's last colonoscopy was in December 2008 for chronic diarrhea and was unremarkable.   Current Medications: Outpatient Encounter Prescriptions as of 05/28/2016  Medication Sig  . calcium-vitamin D (OSCAL WITH D) 500-200 MG-UNIT per tablet Take 1 tablet by mouth 2 (two) times daily.   Marland Kitchen dexlansoprazole (DEXILANT) 60 MG capsule Take 60 mg by mouth daily.  . diazepam (VALIUM) 10 MG tablet Take 10 mg by mouth daily as needed. Muscle spasms  . fexofenadine (ALLEGRA) 180 MG tablet Take 180 mg by mouth every evening.  . fish oil-omega-3 fatty acids 1000 MG capsule Take 1 g by mouth 2 (two) times daily.  Marland Kitchen gabapentin (NEURONTIN) 600 MG tablet Take 600 mg by mouth 3 (three) times daily.  Marland Kitchen lisinopril (PRINIVIL,ZESTRIL) 10 MG tablet Take 10 mg by mouth daily.  . metoCLOPramide (REGLAN) 10 MG tablet Take 1 tablet (10 mg total) by mouth every 6 (six) hours as needed for nausea or vomiting.  . montelukast (SINGULAIR) 10 MG tablet Take 10 mg by mouth daily.   . Multiple Vitamin (  MULITIVITAMIN WITH MINERALS) TABS Take 1 tablet by mouth daily.  . ondansetron (ZOFRAN ODT) 4 MG disintegrating tablet 4mg  ODT q4 hours prn nausea/vomit  . oxyCODONE-acetaminophen (PERCOCET) 10-325 MG tablet Take 1 tablet by mouth every 4 (four) hours as needed for pain.  . rosuvastatin (CRESTOR) 10 MG tablet Take 10 mg by mouth every evening.   . sucralfate (CARAFATE) 1 GM/10ML suspension Take 1 g by mouth every 4 (four) hours.   No facility-administered  encounter medications on file as of 05/28/2016.      Objective: Blood pressure 122/90, pulse 73, temperature 98.4 F (36.9 C), temperature source Oral, resp. rate 18, height 6' (1.829 m), weight 230 lb 3.2 oz (104.4 kg). Patient is alert and in no acute distress. Conjunctiva is pink. Sclera is nonicteric Oropharyngeal mucosa is normal. No neck masses or thyromegaly noted. Cardiac exam with regular rhythm normal S1 and S2. No murmur or gallop noted. Lungs are clear to auscultation. Abdomen is full. Bowel sounds are normal. On palpation abdomen is soft and nontender. Percussion noticed tympanitic and epigastric region. No organomegaly or masses. No LE edema or clubbing noted.  Labs/studies Results:  Blood work from 2 recent ER visits reviewed along with patient. CBC significant for thrombocytopenia. Chemistry panel on 05/22/2016 was normal except glucose of 114. Abdominopelvic CT from 05/22/2016 reviewed  Assessment:  #1. N/V. Recent bouts with intractable see and vomiting would appear to be secondary to gastroenteritis requiring two trips to ER for evaluation and IV fluids. Chronic nausea and vomiting possibly secondary to gastroparesis considering appropriate night has helped him. Peptic ulcer disease needs to be ruled out before gastric emptying study considered. #2. Chronic GERD complicated by short segment Barrett's esophagus. Barrett's was not documented on EGD of November 2014. Heartburn is well controlled with PPI. #3. Abnormal CT feeling hepatic lesion which appears to be hemangioma, while dilated loops of small bowel possibly due to ileus(acute illness) small ventral hernia which appears not to be causing any symptoms.   Plan:  Metoclopramide 10 mg by mouth 30 minutes before each meal. Prescription given for 90 doses without refill. Patient informed of potential side effects. He will stop this medication if he has any and call office. Diagnostic Esophagogastroduodenoscopy  under monitored anesthesia care. If EGD is unremarkable will proceed with solid-phase gastric emptying study off promotility agent. CBC prior to EGD to follow-up on thrombocytopenia.

## 2016-05-28 NOTE — Patient Instructions (Signed)
Take metoclopramide 10 mg by mouth 30 minutes before each meal. .Stop this medication if he experiences tremors or other side effects. Esophagogastroduodenoscopy to be scheduled.

## 2016-05-29 ENCOUNTER — Encounter (HOSPITAL_COMMUNITY): Payer: Self-pay

## 2016-05-29 ENCOUNTER — Encounter (HOSPITAL_COMMUNITY)
Admission: RE | Admit: 2016-05-29 | Discharge: 2016-05-29 | Disposition: A | Discharge status: Home / Self Care | Payer: Medicare Other | Source: Ambulatory Visit | Attending: Internal Medicine | Admitting: Internal Medicine

## 2016-05-29 ENCOUNTER — Other Ambulatory Visit (INDEPENDENT_AMBULATORY_CARE_PROVIDER_SITE_OTHER): Payer: Self-pay | Admitting: *Deleted

## 2016-05-29 DIAGNOSIS — R112 Nausea with vomiting, unspecified: Secondary | ICD-10-CM | POA: Insufficient documentation

## 2016-05-29 HISTORY — DX: Pure hypercholesterolemia, unspecified: E78.00

## 2016-05-29 HISTORY — DX: Anxiety disorder, unspecified: F41.9

## 2016-05-29 NOTE — Progress Notes (Signed)
   05/29/16 1248  OBSTRUCTIVE SLEEP APNEA  Have you ever been diagnosed with sleep apnea through a sleep study? No  Do you snore loudly (loud enough to be heard through closed doors)?  1  Do you often feel tired, fatigued, or sleepy during the daytime (such as falling asleep during driving or talking to someone)? 0  Has anyone observed you stop breathing during your sleep? 1  Do you have, or are you being treated for high blood pressure? 1  BMI more than 35 kg/m2? 0  Age > 50 (1-yes) 1  Neck circumference greater than:Male 16 inches or larger, Male 17inches or larger? 1  Male Gender (Yes=1) 1  Obstructive Sleep Apnea Score 6  Score 5 or greater  Results sent to PCP

## 2016-05-30 ENCOUNTER — Encounter (HOSPITAL_COMMUNITY): Payer: Self-pay | Admitting: Anesthesiology

## 2016-05-30 ENCOUNTER — Other Ambulatory Visit (INDEPENDENT_AMBULATORY_CARE_PROVIDER_SITE_OTHER): Payer: Self-pay | Admitting: Internal Medicine

## 2016-05-30 ENCOUNTER — Ambulatory Visit (HOSPITAL_COMMUNITY)
Admission: RE | Admit: 2016-05-30 | Discharge: 2016-05-30 | Disposition: A | Discharge status: Home / Self Care | Payer: Medicare Other | Source: Ambulatory Visit | Attending: Internal Medicine | Admitting: Internal Medicine

## 2016-05-30 ENCOUNTER — Ambulatory Visit (HOSPITAL_COMMUNITY): Payer: Medicare Other | Admitting: Anesthesiology

## 2016-05-30 ENCOUNTER — Encounter (HOSPITAL_COMMUNITY)
Admission: RE | Disposition: A | Discharge status: Home / Self Care | Payer: Self-pay | Source: Ambulatory Visit | Attending: Internal Medicine

## 2016-05-30 DIAGNOSIS — M1990 Unspecified osteoarthritis, unspecified site: Secondary | ICD-10-CM | POA: Diagnosis not present

## 2016-05-30 DIAGNOSIS — G4730 Sleep apnea, unspecified: Secondary | ICD-10-CM | POA: Insufficient documentation

## 2016-05-30 DIAGNOSIS — I739 Peripheral vascular disease, unspecified: Secondary | ICD-10-CM | POA: Insufficient documentation

## 2016-05-30 DIAGNOSIS — E7800 Pure hypercholesterolemia, unspecified: Secondary | ICD-10-CM | POA: Insufficient documentation

## 2016-05-30 DIAGNOSIS — Z885 Allergy status to narcotic agent status: Secondary | ICD-10-CM | POA: Diagnosis not present

## 2016-05-30 DIAGNOSIS — Z9889 Other specified postprocedural states: Secondary | ICD-10-CM

## 2016-05-30 DIAGNOSIS — K317 Polyp of stomach and duodenum: Secondary | ICD-10-CM | POA: Insufficient documentation

## 2016-05-30 DIAGNOSIS — K449 Diaphragmatic hernia without obstruction or gangrene: Secondary | ICD-10-CM | POA: Diagnosis not present

## 2016-05-30 DIAGNOSIS — K2270 Barrett's esophagus without dysplasia: Secondary | ICD-10-CM | POA: Diagnosis not present

## 2016-05-30 DIAGNOSIS — F419 Anxiety disorder, unspecified: Secondary | ICD-10-CM | POA: Insufficient documentation

## 2016-05-30 DIAGNOSIS — Z85820 Personal history of malignant melanoma of skin: Secondary | ICD-10-CM | POA: Insufficient documentation

## 2016-05-30 DIAGNOSIS — I10 Essential (primary) hypertension: Secondary | ICD-10-CM | POA: Diagnosis not present

## 2016-05-30 DIAGNOSIS — Z87891 Personal history of nicotine dependence: Secondary | ICD-10-CM | POA: Diagnosis not present

## 2016-05-30 DIAGNOSIS — K228 Other specified diseases of esophagus: Secondary | ICD-10-CM | POA: Diagnosis not present

## 2016-05-30 DIAGNOSIS — R112 Nausea with vomiting, unspecified: Secondary | ICD-10-CM

## 2016-05-30 DIAGNOSIS — K210 Gastro-esophageal reflux disease with esophagitis: Secondary | ICD-10-CM | POA: Insufficient documentation

## 2016-05-30 HISTORY — PX: ESOPHAGOGASTRODUODENOSCOPY (EGD) WITH PROPOFOL: SHX5813

## 2016-05-30 HISTORY — PX: BIOPSY: SHX5522

## 2016-05-30 SURGERY — ESOPHAGOGASTRODUODENOSCOPY (EGD) WITH PROPOFOL
Anesthesia: Monitor Anesthesia Care

## 2016-05-30 MED ORDER — FENTANYL CITRATE (PF) 100 MCG/2ML IJ SOLN
INTRAMUSCULAR | Status: AC
  Filled 2016-05-30: qty 2

## 2016-05-30 MED ORDER — MIDAZOLAM HCL 2 MG/2ML IJ SOLN
INTRAMUSCULAR | Status: AC
  Filled 2016-05-30: qty 2

## 2016-05-30 MED ORDER — PROPOFOL 10 MG/ML IV BOLUS
INTRAVENOUS | Status: AC
  Filled 2016-05-30: qty 40

## 2016-05-30 MED ORDER — LIDOCAINE VISCOUS 2 % MT SOLN
OROMUCOSAL | Status: AC
  Filled 2016-05-30: qty 15

## 2016-05-30 MED ORDER — FENTANYL CITRATE (PF) 100 MCG/2ML IJ SOLN
25.0000 ug | INTRAMUSCULAR | Status: DC | PRN
  Administered 2016-05-30: 25 ug via INTRAVENOUS

## 2016-05-30 MED ORDER — MIDAZOLAM HCL 2 MG/2ML IJ SOLN
1.0000 mg | INTRAMUSCULAR | Status: DC | PRN
  Administered 2016-05-30 (×2): 2 mg via INTRAVENOUS
  Filled 2016-05-30: qty 2

## 2016-05-30 MED ORDER — LIDOCAINE HCL (PF) 1 % IJ SOLN
INTRAMUSCULAR | Status: AC
  Filled 2016-05-30: qty 5

## 2016-05-30 MED ORDER — LIDOCAINE HCL (CARDIAC) 10 MG/ML IV SOLN
INTRAVENOUS | Status: DC | PRN
  Administered 2016-05-30: 30 mg via INTRAVENOUS

## 2016-05-30 MED ORDER — FENTANYL CITRATE (PF) 100 MCG/2ML IJ SOLN: 25.0000 ug | INTRAMUSCULAR | Status: DC | PRN

## 2016-05-30 MED ORDER — LACTATED RINGERS IV SOLN
INTRAVENOUS | Status: DC
  Administered 2016-05-30: 09:00:00 via INTRAVENOUS

## 2016-05-30 MED ORDER — CHLORHEXIDINE GLUCONATE CLOTH 2 % EX PADS: 6.0000 | MEDICATED_PAD | Freq: Once | CUTANEOUS | Status: DC

## 2016-05-30 MED ORDER — MIDAZOLAM HCL 5 MG/5ML IJ SOLN
INTRAMUSCULAR | Status: DC | PRN
  Administered 2016-05-30: 2 mg via INTRAVENOUS

## 2016-05-30 MED ORDER — PROPOFOL 500 MG/50ML IV EMUL
INTRAVENOUS | Status: DC | PRN
  Administered 2016-05-30: 150 ug/kg/min via INTRAVENOUS

## 2016-05-30 MED ORDER — ONDANSETRON HCL 4 MG/2ML IJ SOLN
INTRAMUSCULAR | Status: AC
  Filled 2016-05-30: qty 2

## 2016-05-30 MED ORDER — ONDANSETRON HCL 4 MG/2ML IJ SOLN
4.0000 mg | Freq: Once | INTRAMUSCULAR | Status: AC
  Administered 2016-05-30: 4 mg via INTRAVENOUS

## 2016-05-30 NOTE — Transfer of Care (Signed)
Immediate Anesthesia Transfer of Care Note  Patient: [name]  Procedure(s) Performed: Procedure(s) with comments: ESOPHAGOGASTRODUODENOSCOPY (EGD) WITH PROPOFOL (N/A) - 955 BIOPSY  Patient Location: PACU  Anesthesia Type:MAC  Level of Consciousness: awake, alert , oriented and patient cooperative  Airway & Oxygen Therapy: Patient Spontanous Breathing and Patient connected to nasal cannula oxygen  Post-op Assessment: Report given to RN and Post -op Vital signs reviewed and stable  Post vital signs: Reviewed and stable  Last Vitals:  Vitals:   05/30/16 0955 05/30/16 1000  BP: (!) 147/93 (!) 148/88  Pulse:    Resp: 12 18  Temp:      Last Pain:  Vitals:   05/30/16 0848  TempSrc: Oral  PainSc: 5       Patients Stated Pain Goal: 5 ([id number])  Complications: No apparent anesthesia complications

## 2016-05-30 NOTE — Anesthesia Postprocedure Evaluation (Signed)
Anesthesia Post Note  Patient: FAITH BETSILL  Procedure(s) Performed: Procedure(s) (LRB): ESOPHAGOGASTRODUODENOSCOPY (EGD) WITH PROPOFOL (N/A) BIOPSY  Patient location during evaluation: PACU Anesthesia Type: MAC Level of consciousness: awake and alert and oriented Pain management: pain level controlled Vital Signs Assessment: post-procedure vital signs reviewed and stable Respiratory status: spontaneous breathing Cardiovascular status: stable Postop Assessment: no signs of nausea or vomiting Anesthetic complications: no     Last Vitals:  Vitals:   05/30/16 1000 05/30/16 1040  BP: (!) 148/88 100/60  Pulse:  63  Resp: 18 19  Temp:  (P) 36.4 C    Last Pain:  Vitals:   05/30/16 0848  TempSrc: Oral  PainSc: 5                  ADAMS, AMY A

## 2016-05-30 NOTE — Discharge Instructions (Signed)
Esophagogastroduodenoscopy, Care After Introduction Refer to this sheet in the next few weeks. These instructions provide you with information about caring for yourself after your procedure. Your health care provider may also give you more specific instructions. Your treatment has been planned according to current medical practices, but problems sometimes occur. Call your health care provider if you have any problems or questions after your procedure. What can I expect after the procedure? After the procedure, it is common to have:  A sore throat.  Nausea.  Bloating.  Dizziness.  Fatigue. Follow these instructions at home:  Do not eat or drink anything until the numbing medicine (local anesthetic) has worn off and your gag reflex has returned. You will know that the local anesthetic has worn off when you can swallow comfortably.  Do not drive for 24 hours if you received a medicine to help you relax (sedative).  If your health care provider took a tissue sample for testing during the procedure, make sure to get your test results. This is your responsibility. Ask your health care provider or the department performing the test when your results will be ready.  Keep all follow-up visits as told by your health care provider. This is important. Contact a health care provider if:  You cannot stop coughing.  You are not urinating.  You are urinating less than usual. Get help right away if:  You have trouble swallowing.  You cannot eat or drink.  You have throat or chest pain that gets worse.  You are dizzy or light-headed.  You faint.  You have nausea or vomiting.  You have chills.  You have a fever.  You have severe abdominal pain.  You have black, tarry, or bloody stools. This information is not intended to replace advice given to you by your health care provider. Make sure you discuss any questions you have with your health care provider. Document Released: 05/05/2012  Document Revised: 10/25/2015 Document Reviewed: 04/12/2015  2017 Elsevier Resume usual medications and diet as before. No driving for 24 hours.. Gastric emptying study to be scheduled next week. Office will call. Please remember to stop metoclopramide 24 hours before the test. Physician will call with biopsy results.

## 2016-05-30 NOTE — Anesthesia Preprocedure Evaluation (Signed)
Anesthesia Evaluation  Patient identified by MRN, date of birth, ID band Patient awake    Reviewed: Allergy & Precautions, H&P , NPO status , Patient's Chart, lab work & pertinent test results  History of Anesthesia Complications (+) PONV and history of anesthetic complications  Airway Mallampati: I  TM Distance: >3 FB Neck ROM: Full    Dental  (+) Teeth Intact, Dental Advisory Given,    Pulmonary sleep apnea , former smoker,      Pulmonary exam normal breath sounds clear to auscultation       Cardiovascular Exercise Tolerance: Poor hypertension, Pt. on medications + Peripheral Vascular Disease ( AA 4.6cm)   Rhythm:Regular Rate:Normal     Neuro/Psych  Headaches, PSYCHIATRIC DISORDERS Anxiety SPINAL CORD STIMULATOR IMPLANT    GI/Hepatic hiatal hernia, GERD  Controlled and Medicated,  Endo/Other    Renal/GU      Musculoskeletal  (+) Arthritis , Osteoarthritis,    Abdominal Normal abdominal exam  (+)   Peds  Hematology   Anesthesia Other Findings   Reproductive/Obstetrics                             Anesthesia Physical Anesthesia Plan  ASA: III  Anesthesia Plan: MAC   Post-op Pain Management:    Induction: Intravenous  Airway Management Planned: Simple Face Mask  Additional Equipment:   Intra-op Plan:   Post-operative Plan:   Informed Consent: I have reviewed the patients History and Physical, chart, labs and discussed the procedure including the risks, benefits and alternatives for the proposed anesthesia with the patient or authorized representative who has indicated his/her understanding and acceptance.     Plan Discussed with:   Anesthesia Plan Comments:         Anesthesia Quick Evaluation

## 2016-05-30 NOTE — Op Note (Addendum)
[facility] Patient Name: [name] Procedure Date: 05/30/2016 9:50 AM MRN: [id number] Date of Birth: [birth year] Attending MD: Hildred Laser , MD CSN: [id number] Age: 65 Admit Type: Outpatient Procedure:                Upper GI endoscopy Indications:              Reflux esophagitis, Nausea with vomiting Providers:                Hildred Laser, MD, Hinton Rao, RN, Aram Candela Referring MD:             Halford Chessman, MD Medicines:                Cetacaine spray, Propofol per Anesthesia Complications:            No immediate complications. Estimated Blood Loss:     Estimated blood loss was minimal. Procedure:                Pre-Anesthesia Assessment:                           - Prior to the procedure, a History and Physical                            was performed, and patient medications and                            allergies were reviewed. The patient's tolerance of                            previous anesthesia was also reviewed. The risks                            and benefits of the procedure and the sedation                            options and risks were discussed with the patient.                            All questions were answered, and informed consent                            was obtained. Prior Anticoagulants: The patient has                            taken no previous anticoagulant or antiplatelet                            agents. ASA Grade Assessment: II - A patient with                            mild systemic disease. After reviewing the risks                            and benefits, the patient was deemed in  satisfactory condition to undergo the procedure.                           After obtaining informed consent, the endoscope was                            passed under direct vision. Throughout the                            procedure, the patient's blood pressure, pulse, and                            oxygen  saturations were monitored continuously. The                            [id number]) scope was introduced through the                            mouth, and advanced to the second part of duodenum.                            The upper GI endoscopy was accomplished without                            difficulty. The patient tolerated the procedure                            well. Scope In: 10:14:52 AM Scope Out: 10:27:30 AM Total Procedure Duration: 0 hours 12 minutes 38 seconds  Findings:      The proximal esophagus and mid esophagus were normal.      There were esophageal mucosal changes consistent with short-segment       Barrett's esophagus present in the distal esophagus. The maximum       longitudinal extent of these mucosal changes was 1 cm in length. Mucosa       was biopsied with a cold forceps for histology randomly.      The Z-line was irregular and was found 38 cm from the incisors.      A 2 cm hiatal hernia was present.      A small amount of food (residue) was found in the gastric body.      A few small sessile polyps were found in the gastric body.      Evidence of a fundoplication was found in the gastric fundus. The wrap       appeared loose.      The pylorus was normal.      The duodenal bulb was normal.      Food (residue) was found in the second portion of the duodenum.      The second portion of the duodenum was normal. Impression:               - Normal proximal esophagus and mid esophagus.                           - Esophageal mucosal changes consistent with  short-segment Barrett's esophagus. Biopsied.                           - Z-line irregular, 38 cm from the incisors.                           - 2 cm hiatal hernia.                           - A small amount of food (residue) in the stomach.                           - A few gastric polyps.                           - A fundoplication was found. The wrap appears                             loose.                           - Normal pylorus.                           - Normal duodenal bulb.                           - Retained food in the duodenum.                           - Normal second portion of the duodenum. Moderate Sedation:      Per Anesthesia Care Recommendation:           - Patient has a contact number available for                            emergencies. The signs and symptoms of potential                            delayed complications were discussed with the                            patient. Return to normal activities tomorrow.                            Written discharge instructions were provided to the                            patient.                           - Resume previous diet.                           - Continue present medications.                           -  Await pathology results.                           - Do a gastric emptying study at appointment to be                            scheduled. Procedure Code(s):        --- Professional ---                           [phone], Esophagogastroduodenoscopy, flexible,                            transoral; with biopsy, single or multiple Diagnosis Code(s):        --- Professional ---                           K22.8, Other specified diseases of esophagus                           K44.9, Diaphragmatic hernia without obstruction or                            gangrene                           K31.7, Polyp of stomach and duodenum                           Z98.890, Other specified postprocedural states                           K21.0, Gastro-esophageal reflux disease with                            esophagitis                           R11.2, Nausea with vomiting, unspecified CPT copyright 2016 American Medical Association. All rights reserved. The codes documented in this report are preliminary and upon coder review may  be revised to meet current compliance requirements. Hildred Laser,  MD Hildred Laser, MD 05/30/2016 10:44:30 AM This report has been signed electronically. Number of Addenda: 0

## 2016-05-30 NOTE — Anesthesia Procedure Notes (Signed)
Procedure Name: MAC Date/Time: 05/30/2016 10:07 AM Performed by: Andree Elk, Stanly Si A Pre-anesthesia Checklist: Patient identified, Emergency Drugs available, Suction available, Patient being monitored and Timeout performed Oxygen Delivery Method: Simple face mask

## 2016-05-30 NOTE — H&P (Signed)
[name] is an 65 y.o. male.   Chief Complaint: Patient is here for EGD. HPI: Patient is 65 year old Caucasian male with complicated GI history was undergone multiple surgeries for gastric esophageal reflux disease who also has short segment Barrett's esophagus now presents with recurrent postprandial nausea and sporadic vomiting. Last week he had an episode of nausea and vomiting lasting for 4 days when he was seen in emergency room. Lab studies are unremarkable. Abdominopelvic CT suggested ileus. He has been on limited diet. Patient reports improvement with metoclopramide. He denies emesis melena or rectal bleeding.  Past Medical History:  Diagnosis Date  . Anxiety   . Arthritis   .    .    . Cancer ([place]) 2007   melanoma stage 3 stomach bx  . GERD (gastroesophageal reflux disease)    barretts esophagus  . H/O hiatal hernia   . Headache(784.0)   . Hypercholesteremia   . Hypertension   . PONV (postoperative nausea and vomiting)    trouble breathing after last surgery  . Hepatic hemangioma.       .       .         Past Surgical History:  Procedure Laterality Date  . ABDOMINAL SURGERY     x3  . back surgeries     x 9  . BACK SURGERY     x8  . BIOPSY N/A 03/28/2013   Procedure: Distal Esophageal Biopsy;  Surgeon: Rogene Houston, MD;  Location: [facility];  Service: Endoscopy;  Laterality: N/A;  . CHEST SURGERY     lung incisional hernia for hernia  . ESOPHAGOGASTRODUODENOSCOPY (EGD) WITH PROPOFOL N/A 03/28/2013   Procedure: ESOPHAGOGASTRODUODENOSCOPY (EGD) WITH PROPOFOL;  Surgeon: Rogene Houston, MD;  Location: [facility];  Service: Endoscopy;  Laterality: N/A;  GE junction @ 38, proximal  margin @37   . HEMORRHOID SURGERY     x2  . HERNIA REPAIR    . HIATAL HERNIA REPAIR    . JOINT REPLACEMENT     lft knee  . KNEE ARTHROPLASTY     lft  . LEG SURGERY     x4 crushed   . LITHOTRIPSY    . MELANOMA EXCISION     lft abdomen  . NECK SURGERY  2012  . POSTERIOR CERVICAL  FUSION/FORAMINOTOMY  08/18/2011   Procedure: POSTERIOR CERVICAL FUSION/FORAMINOTOMY LEVEL 3;  Surgeon: Ophelia Charter, MD;  Location: [facility];  Service: Neurosurgery;  Laterality: N/A;  Cervical three-four, four-five, five-six posterior cervical  fusion with instrumentation; Right cervical three-four, four-five laminotomy  . SHOULDER ARTHROSCOPY     rt bith defect , spur, rotator cuff  . SPINAL CORD STIMULATOR IMPLANT     rt hip  . THYROID LOBECTOMY     rt    Family History  Problem Relation Age of Onset  . Heart disease Father   . Heart disease Paternal Grandmother   . Heart disease Paternal Grandfather   . Heart disease Maternal Grandfather   . Heart disease Brother     heart transplant  . Aneurysm      pt states several members have aortic aneurysm  . Skin cancer      several members  . Lymphoma Brother    Social History:  reports that he quit smoking about 35 years ago. His smoking use included Cigarettes. He has a 40.00 pack-year smoking history. He has quit using smokeless tobacco. He reports that he does not drink alcohol or use drugs.  Allergies:  Allergies  Allergen Reactions  . Demerol Other (See Comments)    "Makes me crazy."    Medications Prior to Admission  Medication Sig Dispense Refill  . calcium-vitamin D (OSCAL WITH D) 500-200 MG-UNIT per tablet Take 1 tablet by mouth 2 (two) times daily.     Marland Kitchen dexlansoprazole (DEXILANT) 60 MG capsule Take 60 mg by mouth daily.    . ondansetron (ZOFRAN ODT) 4 MG disintegrating tablet 4mg  ODT q4 hours prn nausea/vomit 12 tablet 0  . oxyCODONE-acetaminophen (PERCOCET) 10-325 MG tablet Take 1 tablet by mouth every 4 (four) hours as needed for pain.    . diazepam (VALIUM) 10 MG tablet Take 10 mg by mouth daily as needed. Muscle spasms    . fexofenadine (ALLEGRA) 180 MG tablet Take 180 mg by mouth every evening.    . fish oil-omega-3 fatty acids 1000 MG capsule Take 1 g by mouth 2 (two) times daily.    Marland Kitchen gabapentin  (NEURONTIN) 600 MG tablet Take 600 mg by mouth 3 (three) times daily.    Marland Kitchen lisinopril (PRINIVIL,ZESTRIL) 10 MG tablet Take 10 mg by mouth daily.    . metoCLOPramide (REGLAN) 10 MG tablet Take 1 tablet (10 mg total) by mouth 3 (three) times daily before meals. 90 tablet 0  . montelukast (SINGULAIR) 10 MG tablet Take 10 mg by mouth daily.     . Multiple Vitamin (MULITIVITAMIN WITH MINERALS) TABS Take 1 tablet by mouth daily.    . rosuvastatin (CRESTOR) 10 MG tablet Take 10 mg by mouth every evening.     . sucralfate (CARAFATE) 1 GM/10ML suspension Take 1 g by mouth every 4 (four) hours.      No results found for this or any previous visit (from the past 48 hour(s)). No results found.  ROS  Blood pressure (!) 148/88, pulse (!) 53, temperature 97.8 F (36.6 C), temperature source Oral, resp. rate 18, height 6' (1.829 m), weight 225 lb (102.1 kg), SpO2 99 %. Physical Exam  Constitutional:  WD mildly obese Caucasian male in NAD.  HENT:  Mouth/Throat: Oropharynx is clear and moist.  Eyes: Conjunctivae are normal. No scleral icterus.  Neck: No thyromegaly present.  Cardiovascular: Normal rate, regular rhythm and normal heart sounds.   No murmur heard. Respiratory: Effort normal and breath sounds normal.  GI:  Abdomen is full with upper midline scar. Abdomen is soft and nontender. No organomegaly or masses.  Musculoskeletal: He exhibits no edema.  Lymphadenopathy:    He has no cervical adenopathy.  Neurological: He is alert.  Skin: Skin is warm and dry.     Assessment/Plan Nausea and vomiting. History of GERD and short segment Barrett's esophagus. Diagnostic EGD under MAC.  Hildred Laser, MD 05/30/2016, 10:02 AM

## 2016-06-03 ENCOUNTER — Encounter (HOSPITAL_COMMUNITY): Payer: Self-pay | Admitting: Internal Medicine

## 2016-06-05 ENCOUNTER — Encounter (HOSPITAL_COMMUNITY)
Admission: RE | Admit: 2016-06-05 | Discharge: 2016-06-05 | Disposition: A | Discharge status: Home / Self Care | Payer: Medicare Other | Source: Ambulatory Visit | Attending: Internal Medicine | Admitting: Internal Medicine

## 2016-06-05 ENCOUNTER — Encounter (HOSPITAL_COMMUNITY): Payer: Self-pay

## 2016-06-05 DIAGNOSIS — R112 Nausea with vomiting, unspecified: Secondary | ICD-10-CM | POA: Diagnosis not present

## 2016-06-05 MED ORDER — TECHNETIUM TC 99M SULFUR COLLOID
2.0000 | Freq: Once | INTRAVENOUS | Status: AC | PRN
  Administered 2016-06-05: 2.2 via ORAL

## 2016-06-25 ENCOUNTER — Telehealth (INDEPENDENT_AMBULATORY_CARE_PROVIDER_SITE_OTHER): Payer: Self-pay | Admitting: Internal Medicine

## 2016-06-25 NOTE — Telephone Encounter (Signed)
Spoke to the patient, [place], no script necessary.  May be another 20 days before it's here.  He would like a script for Reglan 10mg  called to [facility] in [place] since [place]'t [place] from [place] yet.  [phone]

## 2016-06-26 ENCOUNTER — Other Ambulatory Visit (INDEPENDENT_AMBULATORY_CARE_PROVIDER_SITE_OTHER): Payer: Self-pay | Admitting: *Deleted

## 2016-06-26 ENCOUNTER — Ambulatory Visit (HOSPITAL_COMMUNITY): Payer: Medicare Other

## 2016-06-26 ENCOUNTER — Encounter (HOSPITAL_COMMUNITY): Payer: Self-pay

## 2016-06-26 MED ORDER — METOCLOPRAMIDE HCL 10 MG PO TABS: 10.0000 mg | ORAL_TABLET | Freq: Three times a day (TID) | ORAL | 0 refills | Status: DC

## 2016-06-26 NOTE — Telephone Encounter (Signed)
I will address this with Dr.Rehman , once approved Rx will be sent to [facility]. Patient will be notified.

## 2016-06-27 ENCOUNTER — Ambulatory Visit (INDEPENDENT_AMBULATORY_CARE_PROVIDER_SITE_OTHER): Payer: Medicare Other | Admitting: Internal Medicine

## 2016-07-04 ENCOUNTER — Other Ambulatory Visit: Payer: Self-pay | Admitting: Neurosurgery

## 2016-07-04 DIAGNOSIS — M545 Low back pain: Principal | ICD-10-CM

## 2016-07-04 DIAGNOSIS — G8929 Other chronic pain: Secondary | ICD-10-CM

## 2016-07-04 DIAGNOSIS — M546 Pain in thoracic spine: Secondary | ICD-10-CM

## 2016-07-04 DIAGNOSIS — M542 Cervicalgia: Secondary | ICD-10-CM

## 2016-07-11 ENCOUNTER — Ambulatory Visit
Admission: RE | Admit: 2016-07-11 | Discharge: 2016-07-11 | Disposition: A | Discharge status: Home / Self Care | Payer: Medicare Other | Source: Ambulatory Visit | Attending: Neurosurgery | Admitting: Neurosurgery

## 2016-07-11 VITALS — BP 128/71 | HR 67

## 2016-07-11 DIAGNOSIS — M542 Cervicalgia: Secondary | ICD-10-CM

## 2016-07-11 DIAGNOSIS — G8929 Other chronic pain: Secondary | ICD-10-CM

## 2016-07-11 DIAGNOSIS — M545 Low back pain: Secondary | ICD-10-CM

## 2016-07-11 DIAGNOSIS — Z981 Arthrodesis status: Secondary | ICD-10-CM

## 2016-07-11 DIAGNOSIS — M546 Pain in thoracic spine: Secondary | ICD-10-CM

## 2016-07-11 DIAGNOSIS — M549 Dorsalgia, unspecified: Secondary | ICD-10-CM

## 2016-07-11 MED ORDER — IOPAMIDOL (ISOVUE-M 300) INJECTION 61%
10.0000 mL | Freq: Once | INTRAMUSCULAR | Status: AC | PRN
  Administered 2016-07-11: 10 mL via EPIDURAL

## 2016-07-11 MED ORDER — DIAZEPAM 5 MG PO TABS
10.0000 mg | ORAL_TABLET | Freq: Once | ORAL | Status: AC
  Administered 2016-07-11: 10 mg via ORAL

## 2016-07-11 NOTE — Discharge Instructions (Signed)
Myelogram Discharge Instructions  1. Go home and rest quietly for the next 24 hours.  It is important to lie flat for the next 24 hours.  Get up only to go to the restroom.  You may lie in the bed or on a couch on your back, your stomach, your left side or your right side.  You may have one pillow under your head.  You may have pillows between your knees while you are on your side or under your knees while you are on your back.  2. DO NOT drive today.  Recline the seat as far back as it will go, while still wearing your seat belt, on the way home.  3. You may get up to go to the bathroom as needed.  You may sit up for 10 minutes to eat.  You may resume your normal diet and medications unless otherwise indicated.  Drink plenty of extra fluids today and tomorrow.  4. The incidence of a spinal headache with nausea and/or vomiting is about 5% (one in 20 patients).  If you develop a headache, lie flat and drink plenty of fluids until the headache goes away.  Caffeinated beverages may be helpful.  If you develop severe nausea and vomiting or a headache that does not go away with flat bed rest, call [phone].  5. You may resume normal activities after your 24 hours of bed rest is over; however, do not exert yourself strongly or do any heavy lifting tomorrow.  6. Call your physician for a follow-up appointment.    You may resume Reglan on Saturday, July 12, 2016 after 10:30a.m.

## 2016-07-29 ENCOUNTER — Encounter (INDEPENDENT_AMBULATORY_CARE_PROVIDER_SITE_OTHER): Payer: Self-pay | Admitting: Internal Medicine

## 2016-07-29 ENCOUNTER — Ambulatory Visit (INDEPENDENT_AMBULATORY_CARE_PROVIDER_SITE_OTHER): Payer: Medicare Other | Admitting: Internal Medicine

## 2016-07-29 VITALS — BP 118/76 | HR 74 | Temp 97.9°F | Resp 18 | Ht 72.0 in | Wt 229.0 lb

## 2016-07-29 DIAGNOSIS — K219 Gastro-esophageal reflux disease without esophagitis: Secondary | ICD-10-CM | POA: Diagnosis not present

## 2016-07-29 DIAGNOSIS — K3184 Gastroparesis: Secondary | ICD-10-CM

## 2016-07-29 NOTE — Progress Notes (Signed)
Presenting complaint;  Follow-up for GERD and gastroparesis.  Database and Subjective:  Patient is 66 year old Caucasian male with several year history of GERD and multiple anti-reflex surgeries as well as history of short segment Barrett's esophagus who was evaluated back in December for nausea and vomiting. He was given a few doses of metoclopramide by ER physician and he noted significant symptomatic improvement. Underwent EGD on 05/30/2016 revealing findings of short segment Barrett's but biopsy was negative loose fundal wrap and food debris in the stomach. Return for solid-phase gastric emptying study off metoclopramide. Study was very abnormal. 91% of tracer was still stomach at 4 hours. [place] she's been taking 10 mg before each meal.  He feels much better. He has taken Zofran a few times in the last 2 months. He is not having foul-smelling burps anymore. He also is not having heartburn regurgitation or abdominal pain. He is not having any side effects with domperidone. He states one month supply costs since $30. He tells me he is to have back surgery in near future. He denies melena or rectal bleeding. His bowels generally move daily. He wants to know if he can stop sucralfate.    Current Medications: Outpatient Encounter Prescriptions as of 07/29/2016  Medication Sig  . calcium-vitamin D (OSCAL WITH D) 500-200 MG-UNIT per tablet Take 1 tablet by mouth 2 (two) times daily.   . diazepam (VALIUM) 10 MG tablet Take 10 mg by mouth daily as needed. Muscle spasms  . fexofenadine (ALLEGRA) 180 MG tablet Take 180 mg by mouth every evening.  . fish oil-omega-3 fatty acids 1000 MG capsule Take 1 g by mouth 2 (two) times daily.  Marland Kitchen gabapentin (NEURONTIN) 600 MG tablet Take 600 mg by mouth 3 (three) times daily.  Marland Kitchen lisinopril (PRINIVIL,ZESTRIL) 10 MG tablet Take 10 mg by mouth daily.  . montelukast (SINGULAIR) 10 MG tablet Take 10 mg by mouth  daily.   . Multiple Vitamin (MULITIVITAMIN WITH MINERALS) TABS Take 1 tablet by mouth daily.  . NON FORMULARY Domperidone 10 mg - patient is taking 1 po tid.  . ondansetron (ZOFRAN ODT) 4 MG disintegrating tablet 4mg  ODT q4 hours prn nausea/vomit  . oxyCODONE-acetaminophen (PERCOCET) 10-325 MG tablet Take 1 tablet by mouth every 4 (four) hours as needed for pain.  . rosuvastatin (CRESTOR) 10 MG tablet Take 10 mg by mouth every evening.   Marland Kitchen dexlansoprazole (DEXILANT) 60 MG capsule Take 60 mg by mouth daily.  . sucralfate (CARAFATE) 1 GM/10ML suspension Take 1 g by mouth every 4 (four) hours.  . [DISCONTINUED] metoCLOPramide (REGLAN) 10 MG tablet Take 1 tablet (10 mg total) by mouth 3 (three) times daily before meals. (Patient not taking: Reported on 07/29/2016)   No facility-administered encounter medications on file as of 07/29/2016.      Objective: Blood pressure 118/76, pulse 74, temperature 97.9 F (36.6 C), temperature source Oral, resp. rate 18, height 6' (1.829 m), weight 229 lb (103.9 kg). Patient is alert and in no acute distress. Conjunctiva is pink. Sclera is nonicteric Oropharyngeal mucosa is normal. No neck masses or thyromegaly noted. Cardiac exam with regular rhythm normal S1 and S2. No murmur or gallop noted. Lungs are clear to auscultation. Abdomen is full but soft and nontender without organomegaly or masses. Succussion splash in epigastric region absent.  No LE edema or clubbing noted. No tremors noted.   Assessment:  #1. Chronic GERD. Symptoms are well controlled with therapy. #2. Gastroparesis. Do not  believe he has had gastroparesis for years. It is either etiopathic or post surgical. #3. History of Barrett's esophagus. However biopsies in December 2017 were negative for Barrett's mucosa. #4. Patient is average risk for CRC. Last colonoscopy was in 2009.   Plan:  Continue domperidone 10 mg by mouth before meals. Patient should continue the excellent at  current dose. Will consider dropping dose to 30 mg daily at next visit if he continues to do well. Screening colonoscopy next year. Discontinue sucralfate.

## 2016-07-29 NOTE — Patient Instructions (Signed)
Call if you experience any side effects with domperidone or if you have frequent heartburn or heartburn more than twice a week.

## 2016-07-30 ENCOUNTER — Encounter (INDEPENDENT_AMBULATORY_CARE_PROVIDER_SITE_OTHER): Payer: Self-pay | Admitting: Internal Medicine

## 2016-08-11 ENCOUNTER — Other Ambulatory Visit: Payer: Self-pay | Admitting: Neurosurgery

## 2016-10-20 DIAGNOSIS — Z85820 Personal history of malignant melanoma of skin: Secondary | ICD-10-CM | POA: Diagnosis not present

## 2016-10-20 DIAGNOSIS — Z08 Encounter for follow-up examination after completed treatment for malignant neoplasm: Secondary | ICD-10-CM | POA: Diagnosis not present

## 2016-10-20 DIAGNOSIS — D0439 Carcinoma in situ of skin of other parts of face: Secondary | ICD-10-CM | POA: Diagnosis not present

## 2016-10-20 DIAGNOSIS — Z1283 Encounter for screening for malignant neoplasm of skin: Secondary | ICD-10-CM | POA: Diagnosis not present

## 2016-10-22 ENCOUNTER — Encounter (HOSPITAL_COMMUNITY): Payer: Self-pay

## 2016-10-22 NOTE — Pre-Procedure Instructions (Signed)
[facility]  10/22/2016      [place] [place] [address] Phone: [phone] Fax: [phone]  [place] [address] [place] [id number] [place] [address] [place] [address] Phone: [phone] Fax: [phone]    Your procedure is scheduled on June 4  Report to [place] at [phone].  Call this number if you have problems the morning of surgery:  [phone]   Remember:  Do not eat food or drink liquids after midnight.  Take these medicines the morning of surgery with A SIP OF WATER Dexlansoprazole (Dexilant), Diazepam (Valium) if needed, gabapentin (Neurontin), montelukast (Singular), Ondansetron (Zofran) if needed, Oxycodone (Percocet) if needed  Stop taking aspirin, BC's, Goody's, Herbal medications, Fish Oil, Aleve, Ibuprofen, Advil, motrin, Vitamins   Do not wear jewelry, make-up or nail polish.  Do not wear lotions, powders, or perfumes, or deoderant.  Do not shave 48 hours prior to surgery.  Men may shave face and neck.  Do not bring valuables to the hospital.  [facility] is not responsible for any belongings or valuables.  Contacts, dentures or bridgework may not be worn into surgery.  Leave your suitcase in the car.  After surgery it may be brought to your room.  For patients admitted to the hospital, discharge time will be determined by your treatment team.  Patients discharged the day of surgery will not be allowed to drive home.    Special instructions:  [place] - Preparing for Surgery  Before surgery, you can play an important role.  Because skin is not sterile, your skin needs to be as free of germs as possible.  You can reduce the number of germs on you skin by washing with CHG (chlorahexidine gluconate) soap before surgery.  CHG is an antiseptic cleaner which kills germs and bonds with the skin to continue killing germs even after washing.  Please DO NOT use  if you have an allergy to CHG or antibacterial soaps.  If your skin becomes reddened/irritated stop using the CHG and inform your nurse when you arrive at Short Stay.  Do not shave (including legs and underarms) for at least 48 hours prior to the first CHG shower.  You may shave your face.  Please follow these instructions carefully:   1.  Shower with CHG Soap the night before surgery and the    morning of Surgery.  2.  If you choose to wash your hair, wash your hair first as usual with your  normal shampoo.  3.  After you shampoo, rinse your hair and body thoroughly to remove the  Shampoo.  4.  Use CHG as you would any other liquid soap.  You can apply chg directly to the skin and wash gently with scrungie or a clean washcloth.  5.  Apply the CHG Soap to your body ONLY FROM THE NECK DOWN.   Do not use on open wounds or open sores.  Avoid contact with your eyes,   ears, mouth and genitals (private parts).  Wash genitals (private parts)  with your normal soap.  6.  Wash thoroughly, paying special attention to the area where your surgery  will be performed.  7.  Thoroughly rinse your body with warm water from the neck down.  8.  DO NOT shower/wash with your normal soap after using and rinsing off  the CHG Soap.  9.  Pat yourself dry with  a clean towel.            10.  Wear clean pajamas.            11.  Place clean sheets on your bed the night of your first shower and do not sleep with pets.  Day of Surgery  Do not apply any lotions/deoderants the morning of surgery.  Please wear clean clothes to the hospital/surgery center.     Please read over the following fact sheets that you were given. Pain Booklet, Coughing and Deep Breathing, MRSA Information and Surgical Site Infection Prevention

## 2016-10-23 ENCOUNTER — Other Ambulatory Visit (HOSPITAL_COMMUNITY): Payer: Medicare Other

## 2016-10-23 ENCOUNTER — Encounter (HOSPITAL_COMMUNITY)
Admission: RE | Admit: 2016-10-23 | Discharge: 2016-10-23 | Disposition: A | Discharge status: Home / Self Care | Payer: Medicare Other | Source: Ambulatory Visit | Attending: Neurosurgery | Admitting: Neurosurgery

## 2016-10-23 ENCOUNTER — Encounter (HOSPITAL_COMMUNITY): Payer: Self-pay

## 2016-10-23 DIAGNOSIS — K219 Gastro-esophageal reflux disease without esophagitis: Secondary | ICD-10-CM | POA: Insufficient documentation

## 2016-10-23 DIAGNOSIS — E7800 Pure hypercholesterolemia, unspecified: Secondary | ICD-10-CM | POA: Insufficient documentation

## 2016-10-23 DIAGNOSIS — G4733 Obstructive sleep apnea (adult) (pediatric): Secondary | ICD-10-CM | POA: Diagnosis not present

## 2016-10-23 DIAGNOSIS — M549 Dorsalgia, unspecified: Secondary | ICD-10-CM | POA: Insufficient documentation

## 2016-10-23 DIAGNOSIS — R51 Headache: Secondary | ICD-10-CM | POA: Diagnosis not present

## 2016-10-23 DIAGNOSIS — F419 Anxiety disorder, unspecified: Secondary | ICD-10-CM | POA: Diagnosis not present

## 2016-10-23 DIAGNOSIS — Z01812 Encounter for preprocedural laboratory examination: Secondary | ICD-10-CM | POA: Diagnosis not present

## 2016-10-23 DIAGNOSIS — Z85820 Personal history of malignant melanoma of skin: Secondary | ICD-10-CM | POA: Diagnosis not present

## 2016-10-23 DIAGNOSIS — Z981 Arthrodesis status: Secondary | ICD-10-CM | POA: Insufficient documentation

## 2016-10-23 DIAGNOSIS — I10 Essential (primary) hypertension: Secondary | ICD-10-CM | POA: Diagnosis not present

## 2016-10-23 HISTORY — DX: Aortic aneurysm of unspecified site, without rupture: I71.9

## 2016-10-23 HISTORY — DX: Atherosclerotic heart disease of native coronary artery without angina pectoris: I25.10

## 2016-10-23 LAB — BASIC METABOLIC PANEL
Anion gap: 10 (ref 5–15)
BUN: 18 mg/dL (ref 6–20)
CO2: 23 mmol/L (ref 22–32)
Calcium: 9.2 mg/dL (ref 8.9–10.3)
Chloride: 106 mmol/L (ref 101–111)
Creatinine, Ser: 1.16 mg/dL (ref 0.61–1.24)
GFR calc Af Amer: >60 mL/min (ref >60)
GFR calc non Af Amer: >60 mL/min (ref >60)
Glucose, Bld: 99 mg/dL (ref 65–99)
Potassium: 4.1 mmol/L (ref 3.5–5.1)
Sodium: 139 mmol/L (ref 135–145)

## 2016-10-23 LAB — CBC
HCT: 43.9 % (ref 39.0–52.0)
Hemoglobin: 14.6 g/dL (ref 13.0–17.0)
MCH: 31.1 pg (ref 26.0–34.0)
MCHC: 33.3 g/dL (ref 30.0–36.0)
MCV: 93.4 fL (ref 78.0–100.0)
Platelets: 107 10*3/uL — ABNORMAL LOW (ref 150–400)
RBC: 4.7 MIL/uL (ref 4.22–5.81)
RDW: 12.8 % (ref 11.5–15.5)
WBC: 3.3 10*3/uL — ABNORMAL LOW (ref 4.0–10.5)

## 2016-10-23 LAB — SURGICAL PCR SCREEN
MRSA, PCR: NEGATIVE
Staphylococcus aureus: POSITIVE — AB

## 2016-10-23 NOTE — Progress Notes (Signed)
PCP - Sharilyn Sites  Cardiologist - Prentice Docker  Chest x-ray - not needed   EKG - 05/30/16 Stress Test - 2017 ECHO - ?? Cardiac Cath - 4-5 years ago  Sleep Study - 91/15 CPAP - yes  Sending to anesthesia for review     Patient denies shortness of breath, fever, cough and chest pain at PAT appointment   Patient verbalized understanding of instructions that were given to them at the PAT appointment. Patient was also instructed that they will need to review over the PAT instructions again at home before surgery.

## 2016-10-23 NOTE — Progress Notes (Signed)
prescriptioon called in at the [facility] pharmacy, patient verbalized understanding

## 2016-10-24 ENCOUNTER — Encounter (HOSPITAL_COMMUNITY): Payer: Self-pay

## 2016-10-24 NOTE — Progress Notes (Signed)
Anesthesia Chart Review:  Pt is a 66 year old male scheduled for lumbar spinal cord stimulator revision, L1-2 decompression, instrumentation, fusion on 11/03/2016 with Newman Pies, M.D.  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 Former smoker. BMI 32. S/p lumbar fusion 03/11/12. S/p cervical fusion 08/18/11.   Anesthesia history:  - pt reports hx of 47 surgeries.  - Reportedly had breathing issues after knee scope 06/18/16, asked to stay, pt refused, told to use CPAP mask at home (anesthesia records on paper chart do not reflect this at all).   - Pt reports he "had to be bagged" in PACU after one of his surgeries here at [facility]. He thought it might have been 08/18/11 cervical fusion.  Note from PACU RN at that time indicates pt was sleepy and had to be reminded to breathe.  No documentation of being re-intubated or "bagged".    Medications include: dexilant, lisinopril, rosuvastatin  Preoperative labs reviewed.    CXR 05/22/16: No acute disease  EKG 05/30/16: Sinus bradycardia (52 bpm). Left axis deviation. Left ventricular hypertrophy with QRS widening  Cardiac event monitor 03/18/16 (care everywhere): 1.Unremarkable event monitor.  2.Arrhythmia as described 3.Symptoms were reported  4.Symptoms were not correlated with arrhythmias.During shortness of breath, patient was noted be in a sinus tachycardia with PACs on occasion.  CTA chest 07/10/15:  1. Stable ascending aortic aneurysm measuring 4.4 cm in maximum diameter currently. Some motion does obscure detail as noted above. 2. No evidence of acute pulmonary embolism. 3. Extensive coronary artery calcifications.Moderate cardiomegaly. 4. Probable hemangioma in the right lobe of liver unchanged compared  to CT abdomen pelvis from 2014.  Nuclear stress test 07/05/15:  - Fixed inferior defect compatible with prior MI or diaphragmatic attenuation. No ischemia suggested. Overall LV function appears normal. EF calculated at 57%.  Cardiac cath 09/08/13 ([facility]): 1. Nonobstructive CAD (proximal LAD 15%, proximal CX 20%, proximal RCA 25%, mid RCA 25%) 2. Heavy calcification of the proximal wall vessels. 3. LV gram not done due to BF. 4. Patient with EF with last RCA injection. Counter-shocked 1 restore NSR.  If no changes, I anticipate pt can proceed with surgery as scheduled.   Willeen Cass, FNP-BC [facility]/Anesthesiology Phone: [phone] 10/24/2016 11:54 AM

## 2016-10-30 DIAGNOSIS — Z23 Encounter for immunization: Secondary | ICD-10-CM | POA: Diagnosis not present

## 2016-10-30 DIAGNOSIS — I719 Aortic aneurysm of unspecified site, without rupture: Secondary | ICD-10-CM | POA: Diagnosis not present

## 2016-10-30 DIAGNOSIS — R7309 Other abnormal glucose: Secondary | ICD-10-CM | POA: Diagnosis not present

## 2016-10-30 DIAGNOSIS — E782 Mixed hyperlipidemia: Secondary | ICD-10-CM | POA: Diagnosis not present

## 2016-10-30 DIAGNOSIS — Z1389 Encounter for screening for other disorder: Secondary | ICD-10-CM | POA: Diagnosis not present

## 2016-10-30 DIAGNOSIS — J029 Acute pharyngitis, unspecified: Secondary | ICD-10-CM | POA: Diagnosis not present

## 2016-11-03 ENCOUNTER — Inpatient Hospital Stay (HOSPITAL_COMMUNITY): Payer: Medicare Other

## 2016-11-03 ENCOUNTER — Inpatient Hospital Stay (HOSPITAL_COMMUNITY): Payer: Medicare Other | Admitting: Vascular Surgery

## 2016-11-03 ENCOUNTER — Encounter (HOSPITAL_COMMUNITY): Payer: Self-pay | Admitting: *Deleted

## 2016-11-03 ENCOUNTER — Inpatient Hospital Stay (HOSPITAL_COMMUNITY): Payer: Medicare Other | Admitting: Certified Registered Nurse Anesthetist

## 2016-11-03 ENCOUNTER — Inpatient Hospital Stay (HOSPITAL_COMMUNITY)
Admission: RE | Admit: 2016-11-03 | Discharge: 2016-11-05 | DRG: 454 | Disposition: A | Discharge status: Home / Self Care | Payer: Medicare Other | Source: Ambulatory Visit | Attending: Neurosurgery | Admitting: Neurosurgery

## 2016-11-03 ENCOUNTER — Encounter (HOSPITAL_COMMUNITY)
Admission: RE | Disposition: A | Discharge status: Home / Self Care | Payer: Self-pay | Source: Ambulatory Visit | Attending: Neurosurgery

## 2016-11-03 DIAGNOSIS — Z96652 Presence of left artificial knee joint: Secondary | ICD-10-CM | POA: Diagnosis present

## 2016-11-03 DIAGNOSIS — Z419 Encounter for procedure for purposes other than remedying health state, unspecified: Secondary | ICD-10-CM

## 2016-11-03 DIAGNOSIS — E7800 Pure hypercholesterolemia, unspecified: Secondary | ICD-10-CM | POA: Diagnosis not present

## 2016-11-03 DIAGNOSIS — M545 Low back pain: Secondary | ICD-10-CM | POA: Diagnosis not present

## 2016-11-03 DIAGNOSIS — K219 Gastro-esophageal reflux disease without esophagitis: Secondary | ICD-10-CM | POA: Diagnosis present

## 2016-11-03 DIAGNOSIS — M48061 Spinal stenosis, lumbar region without neurogenic claudication: Secondary | ICD-10-CM | POA: Diagnosis not present

## 2016-11-03 DIAGNOSIS — Z79899 Other long term (current) drug therapy: Secondary | ICD-10-CM

## 2016-11-03 DIAGNOSIS — G4730 Sleep apnea, unspecified: Secondary | ICD-10-CM | POA: Diagnosis not present

## 2016-11-03 DIAGNOSIS — Z87891 Personal history of nicotine dependence: Secondary | ICD-10-CM | POA: Diagnosis not present

## 2016-11-03 DIAGNOSIS — I2510 Atherosclerotic heart disease of native coronary artery without angina pectoris: Secondary | ICD-10-CM | POA: Diagnosis present

## 2016-11-03 DIAGNOSIS — Y752 Prosthetic and other implants, materials and neurological devices associated with adverse incidents: Secondary | ICD-10-CM | POA: Diagnosis present

## 2016-11-03 DIAGNOSIS — T85113A Breakdown (mechanical) of implanted electronic neurostimulator, generator, initial encounter: Secondary | ICD-10-CM | POA: Diagnosis not present

## 2016-11-03 DIAGNOSIS — M9689 Other intraoperative and postprocedural complications and disorders of the musculoskeletal system: Secondary | ICD-10-CM | POA: Diagnosis not present

## 2016-11-03 DIAGNOSIS — M5116 Intervertebral disc disorders with radiculopathy, lumbar region: Secondary | ICD-10-CM | POA: Diagnosis present

## 2016-11-03 DIAGNOSIS — Z981 Arthrodesis status: Secondary | ICD-10-CM

## 2016-11-03 DIAGNOSIS — Z85820 Personal history of malignant melanoma of skin: Secondary | ICD-10-CM

## 2016-11-03 DIAGNOSIS — I10 Essential (primary) hypertension: Secondary | ICD-10-CM | POA: Diagnosis not present

## 2016-11-03 DIAGNOSIS — G9520 Unspecified cord compression: Secondary | ICD-10-CM | POA: Diagnosis not present

## 2016-11-03 DIAGNOSIS — K2270 Barrett's esophagus without dysplasia: Secondary | ICD-10-CM | POA: Diagnosis present

## 2016-11-03 DIAGNOSIS — M48062 Spinal stenosis, lumbar region with neurogenic claudication: Principal | ICD-10-CM | POA: Diagnosis present

## 2016-11-03 DIAGNOSIS — M4326 Fusion of spine, lumbar region: Secondary | ICD-10-CM | POA: Diagnosis not present

## 2016-11-03 DIAGNOSIS — M960 Pseudarthrosis after fusion or arthrodesis: Secondary | ICD-10-CM | POA: Diagnosis not present

## 2016-11-03 HISTORY — PX: SPINAL CORD STIMULATOR INSERTION: SHX5378

## 2016-11-03 LAB — TYPE AND SCREEN
ABO/RH(D): A POS
Antibody Screen: POSITIVE

## 2016-11-03 SURGERY — INSERTION, SPINAL CORD STIMULATOR, LUMBAR
Anesthesia: General | Site: Spine Lumbar

## 2016-11-03 MED ORDER — OXYCODONE-ACETAMINOPHEN 10-325 MG PO TABS: 1.0000 | ORAL_TABLET | ORAL | Status: DC | PRN

## 2016-11-03 MED ORDER — ONDANSETRON HCL 4 MG PO TABS
4.0000 mg | ORAL_TABLET | Freq: Four times a day (QID) | ORAL | Status: DC | PRN
  Administered 2016-11-03: 4 mg via ORAL
  Filled 2016-11-03: qty 1

## 2016-11-03 MED ORDER — DEXTROSE 5 % IV SOLN
INTRAVENOUS | Status: DC | PRN
  Administered 2016-11-03: 20 ug/min via INTRAVENOUS

## 2016-11-03 MED ORDER — ROCURONIUM BROMIDE 10 MG/ML (PF) SYRINGE
PREFILLED_SYRINGE | INTRAVENOUS | Status: AC
  Filled 2016-11-03: qty 5

## 2016-11-03 MED ORDER — ARTIFICIAL TEARS OPHTHALMIC OINT
TOPICAL_OINTMENT | OPHTHALMIC | Status: AC
  Filled 2016-11-03: qty 3.5

## 2016-11-03 MED ORDER — DEXAMETHASONE SODIUM PHOSPHATE 10 MG/ML IJ SOLN
INTRAMUSCULAR | Status: DC | PRN
  Administered 2016-11-03: 10 mg via INTRAVENOUS

## 2016-11-03 MED ORDER — LACTATED RINGERS IV SOLN
INTRAVENOUS | Status: DC | PRN
  Administered 2016-11-03 (×2): via INTRAVENOUS

## 2016-11-03 MED ORDER — MIDAZOLAM HCL 5 MG/5ML IJ SOLN
INTRAMUSCULAR | Status: DC | PRN
  Administered 2016-11-03: 2 mg via INTRAVENOUS

## 2016-11-03 MED ORDER — DEXAMETHASONE SODIUM PHOSPHATE 10 MG/ML IJ SOLN
INTRAMUSCULAR | Status: AC
  Filled 2016-11-03: qty 1

## 2016-11-03 MED ORDER — EPHEDRINE SULFATE 50 MG/ML IJ SOLN
INTRAMUSCULAR | Status: DC | PRN
  Administered 2016-11-03: 10 mg via INTRAVENOUS
  Administered 2016-11-03 (×2): 5 mg via INTRAVENOUS

## 2016-11-03 MED ORDER — BACITRACIN ZINC 500 UNIT/GM EX OINT
TOPICAL_OINTMENT | CUTANEOUS | Status: AC
  Filled 2016-11-03: qty 28.35

## 2016-11-03 MED ORDER — BUPIVACAINE-EPINEPHRINE (PF) 0.5% -1:200000 IJ SOLN
INTRAMUSCULAR | Status: AC
  Filled 2016-11-03: qty 30

## 2016-11-03 MED ORDER — ADULT MULTIVITAMIN W/MINERALS CH
1.0000 | ORAL_TABLET | Freq: Every day | ORAL | Status: DC
  Administered 2016-11-04: 1 via ORAL
  Filled 2016-11-03: qty 1

## 2016-11-03 MED ORDER — MIDAZOLAM HCL 2 MG/2ML IJ SOLN
INTRAMUSCULAR | Status: AC
  Filled 2016-11-03: qty 2

## 2016-11-03 MED ORDER — CHLORHEXIDINE GLUCONATE CLOTH 2 % EX PADS: 6.0000 | MEDICATED_PAD | Freq: Once | CUTANEOUS | Status: DC

## 2016-11-03 MED ORDER — SODIUM CHLORIDE 0.9 % IV SOLN: 250.0000 mL | INTRAVENOUS | Status: DC

## 2016-11-03 MED ORDER — VANCOMYCIN HCL 1000 MG IV SOLR
INTRAVENOUS | Status: DC | PRN
  Administered 2016-11-03: 1000 mg via TOPICAL

## 2016-11-03 MED ORDER — FENTANYL CITRATE (PF) 250 MCG/5ML IJ SOLN
INTRAMUSCULAR | Status: AC
  Filled 2016-11-03: qty 5

## 2016-11-03 MED ORDER — PROMETHAZINE HCL 25 MG/ML IJ SOLN: 6.2500 mg | INTRAMUSCULAR | Status: DC | PRN

## 2016-11-03 MED ORDER — LIDOCAINE HCL (CARDIAC) 20 MG/ML IV SOLN
INTRAVENOUS | Status: DC | PRN
  Administered 2016-11-03: 100 mg via INTRAVENOUS

## 2016-11-03 MED ORDER — GABAPENTIN 600 MG PO TABS
600.0000 mg | ORAL_TABLET | Freq: Three times a day (TID) | ORAL | Status: DC
  Administered 2016-11-04 (×2): 600 mg via ORAL
  Filled 2016-11-03 (×3): qty 1

## 2016-11-03 MED ORDER — HYDROMORPHONE HCL 1 MG/ML IJ SOLN: 0.2500 mg | INTRAMUSCULAR | Status: DC | PRN

## 2016-11-03 MED ORDER — DOCUSATE SODIUM 100 MG PO CAPS
100.0000 mg | ORAL_CAPSULE | Freq: Two times a day (BID) | ORAL | Status: DC
  Administered 2016-11-04 (×2): 100 mg via ORAL
  Filled 2016-11-03 (×3): qty 1

## 2016-11-03 MED ORDER — CEFAZOLIN SODIUM-DEXTROSE 2-4 GM/100ML-% IV SOLN
2.0000 g | Freq: Three times a day (TID) | INTRAVENOUS | Status: AC
  Administered 2016-11-03 (×2): 2 g via INTRAVENOUS
  Filled 2016-11-03 (×2): qty 100

## 2016-11-03 MED ORDER — ACETAMINOPHEN 325 MG PO TABS
650.0000 mg | ORAL_TABLET | ORAL | Status: DC | PRN
  Administered 2016-11-04: 650 mg via ORAL
  Filled 2016-11-03: qty 2

## 2016-11-03 MED ORDER — HYDROXYZINE HCL 50 MG/ML IM SOLN
50.0000 mg | Freq: Four times a day (QID) | INTRAMUSCULAR | Status: DC | PRN
  Administered 2016-11-03 – 2016-11-04 (×3): 50 mg via INTRAMUSCULAR
  Filled 2016-11-03 (×2): qty 1

## 2016-11-03 MED ORDER — SCOPOLAMINE 1 MG/3DAYS TD PT72SCOPOLAMINE 1 MG/3DAYS
MEDICATED_PATCH | TRANSDERMAL | Status: DC | PRN
Start: 2016-11-03 — End: 2016-11-03
  Administered 2016-11-03: 1 via TRANSDERMAL

## 2016-11-03 MED ORDER — LIDOCAINE 2% (20 MG/ML) 5 ML SYRINGE
INTRAMUSCULAR | Status: AC
  Filled 2016-11-03: qty 5

## 2016-11-03 MED ORDER — EPHEDRINE 5 MG/ML INJ
INTRAVENOUS | Status: AC
  Filled 2016-11-03: qty 10

## 2016-11-03 MED ORDER — ONDANSETRON HCL 4 MG/2ML IJ SOLN
INTRAMUSCULAR | Status: DC | PRN
  Administered 2016-11-03: 4 mg via INTRAVENOUS

## 2016-11-03 MED ORDER — ONDANSETRON 4 MG PO TBDP
4.0000 mg | ORAL_TABLET | ORAL | Status: DC | PRN
Start: 2016-11-03 — End: 2016-11-05
  Filled 2016-11-03: qty 1

## 2016-11-03 MED ORDER — PANTOPRAZOLE SODIUM 40 MG PO TBEC
40.0000 mg | DELAYED_RELEASE_TABLET | Freq: Every day | ORAL | Status: DC
  Administered 2016-11-04: 40 mg via ORAL
  Filled 2016-11-03: qty 1

## 2016-11-03 MED ORDER — BACITRACIN ZINC 500 UNIT/GM EX OINT
TOPICAL_OINTMENT | CUTANEOUS | Status: DC | PRN
  Administered 2016-11-03: 1 via TOPICAL

## 2016-11-03 MED ORDER — FENTANYL CITRATE (PF) 100 MCG/2ML IJ SOLN
INTRAMUSCULAR | Status: DC | PRN
  Administered 2016-11-03 (×5): 50 ug via INTRAVENOUS

## 2016-11-03 MED ORDER — LORATADINE 10 MG PO TABS
10.0000 mg | ORAL_TABLET | Freq: Every day | ORAL | Status: DC
Start: 2016-11-03 — End: 2016-11-05
  Administered 2016-11-04: 10 mg via ORAL
  Filled 2016-11-03: qty 1

## 2016-11-03 MED ORDER — OXYCODONE HCL 5 MG PO TABS: 5.0000 mg | ORAL_TABLET | ORAL | Status: DC | PRN

## 2016-11-03 MED ORDER — THROMBIN 5000 UNITS EX SOLR
CUTANEOUS | Status: DC | PRN
  Administered 2016-11-03: 5 mL via TOPICAL

## 2016-11-03 MED ORDER — CYCLOBENZAPRINE HCL 10 MG PO TABS: 10.0000 mg | ORAL_TABLET | Freq: Three times a day (TID) | ORAL | Status: DC | PRN

## 2016-11-03 MED ORDER — MORPHINE SULFATE (PF) 4 MG/ML IV SOLN
4.0000 mg | INTRAVENOUS | Status: DC | PRN
  Administered 2016-11-03: 4 mg via INTRAVENOUS
  Filled 2016-11-03: qty 1

## 2016-11-03 MED ORDER — THROMBIN 5000 UNITS EX SOLR
CUTANEOUS | Status: AC
  Filled 2016-11-03: qty 5000

## 2016-11-03 MED ORDER — BISACODYL 10 MG RE SUPP: 10.0000 mg | Freq: Every day | RECTAL | Status: DC | PRN

## 2016-11-03 MED ORDER — BACITRACIN 50000 UNITS IM SOLR
INTRAMUSCULAR | Status: DC | PRN
  Administered 2016-11-03: 500 mL

## 2016-11-03 MED ORDER — 0.9 % SODIUM CHLORIDE (POUR BTL) OPTIME
TOPICAL | Status: DC | PRN
  Administered 2016-11-03: 1000 mL

## 2016-11-03 MED ORDER — SUGAMMADEX SODIUM 200 MG/2ML IV SOLN
INTRAVENOUS | Status: AC
  Filled 2016-11-03: qty 2

## 2016-11-03 MED ORDER — ACETAMINOPHEN 650 MG RE SUPP: 650.0000 mg | RECTAL | Status: DC | PRN

## 2016-11-03 MED ORDER — PHENOL 1.4 % MT LIQD: 1.0000 | OROMUCOSAL | Status: DC | PRN

## 2016-11-03 MED ORDER — THROMBIN 20000 UNITS EX SOLR
CUTANEOUS | Status: DC | PRN
  Administered 2016-11-03: 20 mL via TOPICAL

## 2016-11-03 MED ORDER — BUPIVACAINE-EPINEPHRINE (PF) 0.5% -1:200000 IJ SOLN
INTRAMUSCULAR | Status: DC | PRN
  Administered 2016-11-03: 10 mL

## 2016-11-03 MED ORDER — ACETAMINOPHEN 10 MG/ML IV SOLN
INTRAVENOUS | Status: DC | PRN
  Administered 2016-11-03: 1000 mg via INTRAVENOUS

## 2016-11-03 MED ORDER — BUPIVACAINE LIPOSOME 1.3 % IJ SUSP
20.0000 mL | INTRAMUSCULAR | Status: AC
  Administered 2016-11-03: 20 mL
  Filled 2016-11-03: qty 20

## 2016-11-03 MED ORDER — SODIUM CHLORIDE 0.9% FLUSH: 3.0000 mL | Freq: Two times a day (BID) | INTRAVENOUS | Status: DC

## 2016-11-03 MED ORDER — OXYCODONE-ACETAMINOPHEN 5-325 MG PO TABS: 1.0000 | ORAL_TABLET | ORAL | Status: DC | PRN

## 2016-11-03 MED ORDER — LISINOPRIL 20 MG PO TABS: 10.0000 mg | ORAL_TABLET | Freq: Every day | ORAL | Status: DC

## 2016-11-03 MED ORDER — PROPOFOL 10 MG/ML IV BOLUS
INTRAVENOUS | Status: DC | PRN
  Administered 2016-11-03: 150 mg via INTRAVENOUS

## 2016-11-03 MED ORDER — VANCOMYCIN HCL 1000 MG IV SOLR
INTRAVENOUS | Status: AC
  Filled 2016-11-03: qty 1000

## 2016-11-03 MED ORDER — HYDROXYZINE HCL 25 MG PO TABS
25.0000 mg | ORAL_TABLET | Freq: Four times a day (QID) | ORAL | Status: DC | PRN
  Administered 2016-11-04 (×3): 25 mg via ORAL
  Filled 2016-11-03 (×3): qty 1

## 2016-11-03 MED ORDER — ONDANSETRON HCL 4 MG/2ML IJ SOLN
INTRAMUSCULAR | Status: AC
  Filled 2016-11-03: qty 2

## 2016-11-03 MED ORDER — MONTELUKAST SODIUM 10 MG PO TABS
10.0000 mg | ORAL_TABLET | Freq: Every day | ORAL | Status: DC
  Administered 2016-11-04: 10 mg via ORAL
  Filled 2016-11-03 (×3): qty 1

## 2016-11-03 MED ORDER — MENTHOL 3 MG MT LOZG: 1.0000 | LOZENGE | OROMUCOSAL | Status: DC | PRN

## 2016-11-03 MED ORDER — ONDANSETRON HCL 4 MG/2ML IJ SOLN: 4.0000 mg | Freq: Four times a day (QID) | INTRAMUSCULAR | Status: DC | PRN

## 2016-11-03 MED ORDER — ROCURONIUM BROMIDE 100 MG/10ML IV SOLN
INTRAVENOUS | Status: DC | PRN
  Administered 2016-11-03: 60 mg via INTRAVENOUS
  Administered 2016-11-03 (×2): 10 mg via INTRAVENOUS
  Administered 2016-11-03: 20 mg via INTRAVENOUS

## 2016-11-03 MED ORDER — CEFAZOLIN SODIUM-DEXTROSE 2-4 GM/100ML-% IV SOLN
2.0000 g | INTRAVENOUS | Status: AC
  Administered 2016-11-03: 2 g via INTRAVENOUS
  Filled 2016-11-03: qty 100

## 2016-11-03 MED ORDER — THROMBIN 20000 UNITS EX SOLR
CUTANEOUS | Status: AC
  Filled 2016-11-03: qty 20000

## 2016-11-03 MED ORDER — ARTIFICIAL TEARS OPHTHALMIC OINT
TOPICAL_OINTMENT | OPHTHALMIC | Status: DC | PRN
  Administered 2016-11-03: 1 via OPHTHALMIC

## 2016-11-03 MED ORDER — ROSUVASTATIN CALCIUM 5 MG PO TABS
10.0000 mg | ORAL_TABLET | Freq: Every evening | ORAL | Status: DC
  Filled 2016-11-03: qty 2

## 2016-11-03 MED ORDER — PROPOFOL 10 MG/ML IV BOLUS
INTRAVENOUS | Status: AC
  Filled 2016-11-03: qty 40

## 2016-11-03 MED ORDER — SODIUM CHLORIDE 0.9% FLUSH: 3.0000 mL | INTRAVENOUS | Status: DC | PRN

## 2016-11-03 MED ORDER — SUGAMMADEX SODIUM 200 MG/2ML IV SOLN
INTRAVENOUS | Status: DC | PRN
  Administered 2016-11-03: 200 mg via INTRAVENOUS

## 2016-11-03 SURGICAL SUPPLY — 94 items
ADH SKN CLS APL DERMABOND .7 (GAUZE/BANDAGES/DRESSINGS)
APL SKNCLS STERI-STRIP NONHPOA (GAUZE/BANDAGES/DRESSINGS) ×1
BAG DECANTER FOR FLEXI CONT (MISCELLANEOUS) ×3 IMPLANT
BENZOIN TINCTURE PRP APPL 2/3 (GAUZE/BANDAGES/DRESSINGS) ×2 IMPLANT
BLADE CLIPPER SURG (BLADE) IMPLANT
BUR MATCHSTICK NEURO 3.0 LAGG (BURR) ×2 IMPLANT
BUR PRECISION FLUTE 6.0 (BURR) ×3 IMPLANT
CANISTER SUCT 3000ML PPV (MISCELLANEOUS) ×2 IMPLANT
CAP LCK SPNE (Orthopedic Implant) ×6 IMPLANT
CAP LOCK SPINE RADIUS (Orthopedic Implant) IMPLANT
CAP LOCKING (Orthopedic Implant) ×12 IMPLANT
CARTRIDGE OIL MAESTRO DRILL (MISCELLANEOUS) ×2 IMPLANT
CONT SPEC 4OZ CLIKSEAL STRL BL (MISCELLANEOUS) ×2 IMPLANT
COVER BACK TABLE 60X90IN (DRAPES) ×3 IMPLANT
DERMABOND ADVANCED (GAUZE/BANDAGES/DRESSINGS)
DERMABOND ADVANCED .7 DNX12 (GAUZE/BANDAGES/DRESSINGS) IMPLANT
DEVICE IMPLANT NEUROSTIMULATOR (Neuro Prosthesis/Implant) ×1 IMPLANT
DIFFUSER DRILL AIR PNEUMATIC (MISCELLANEOUS) ×3 IMPLANT
DRAPE C-ARM 42X72 X-RAY (DRAPES) ×5 IMPLANT
DRAPE HALF SHEET 40X57 (DRAPES) ×2 IMPLANT
DRAPE INCISE IOBAN 66X45 STRL (DRAPES) ×1 IMPLANT
DRAPE INCISE IOBAN 85X60 (DRAPES) IMPLANT
DRAPE LAPAROTOMY 100X72X124 (DRAPES) ×3 IMPLANT
DRAPE POUCH INSTRU U-SHP 10X18 (DRAPES) ×3 IMPLANT
DRAPE SURG 17X23 STRL (DRAPES) ×12 IMPLANT
ELECT BLADE 4.0 EZ CLEAN MEGAD (MISCELLANEOUS) ×2
ELECT REM PT RETURN 9FT ADLT (ELECTROSURGICAL) ×2
ELECTRODE BLDE 4.0 EZ CLN MEGD (MISCELLANEOUS) ×1 IMPLANT
ELECTRODE REM PT RTRN 9FT ADLT (ELECTROSURGICAL) ×2 IMPLANT
ENVELOPE ABSORB ANTIBACTERIAL (Neuro Prosthesis/Implant) ×2 IMPLANT
EVACUATOR 1/8 PVC DRAIN (DRAIN) IMPLANT
GAUZE SPONGE 4X4 12PLY STRL (GAUZE/BANDAGES/DRESSINGS) ×3 IMPLANT
GAUZE SPONGE 4X4 16PLY XRAY LF (GAUZE/BANDAGES/DRESSINGS) ×2 IMPLANT
GLOVE BIO SURGEON STRL SZ 6.5 (GLOVE) ×1 IMPLANT
GLOVE BIO SURGEON STRL SZ8 (GLOVE) ×5 IMPLANT
GLOVE BIO SURGEON STRL SZ8.5 (GLOVE) ×5 IMPLANT
GLOVE BIOGEL PI IND STRL 6 (GLOVE) IMPLANT
GLOVE BIOGEL PI IND STRL 6.5 (GLOVE) IMPLANT
GLOVE BIOGEL PI IND STRL 7.0 (GLOVE) IMPLANT
GLOVE BIOGEL PI INDICATOR 6 (GLOVE) ×1
GLOVE BIOGEL PI INDICATOR 6.5 (GLOVE) ×1
GLOVE BIOGEL PI INDICATOR 7.0 (GLOVE) ×3
GLOVE ECLIPSE 9.0 STRL (GLOVE) ×1 IMPLANT
GLOVE EXAM NITRILE LRG STRL (GLOVE) IMPLANT
GLOVE EXAM NITRILE XL STR (GLOVE) IMPLANT
GLOVE EXAM NITRILE XS STR PU (GLOVE) IMPLANT
GLOVE SURG SS PI 6.5 STRL IVOR (GLOVE) ×2 IMPLANT
GOWN STRL REUS W/ TWL LRG LVL3 (GOWN DISPOSABLE) IMPLANT
GOWN STRL REUS W/ TWL XL LVL3 (GOWN DISPOSABLE) ×2 IMPLANT
GOWN STRL REUS W/TWL 2XL LVL3 (GOWN DISPOSABLE) IMPLANT
GOWN STRL REUS W/TWL LRG LVL3 (GOWN DISPOSABLE) ×4
GOWN STRL REUS W/TWL XL LVL3 (GOWN DISPOSABLE) ×6
IMPL SPINE RISE 8-15-10-26M (Neuro Prosthesis/Implant) IMPLANT
IMPLANT SPINE RISE 8-15-10-26M (Neuro Prosthesis/Implant) ×4 IMPLANT
KIT BASIN OR (CUSTOM PROCEDURE TRAY) ×3 IMPLANT
KIT INFUSE X SMALL 1.4CC (Orthopedic Implant) ×1 IMPLANT
KIT ROOM TURNOVER OR (KITS) ×3 IMPLANT
NDL HYPO 21X1.5 SAFETY (NEEDLE) IMPLANT
NEEDLE HYPO 21X1.5 SAFETY (NEEDLE) ×2 IMPLANT
NEEDLE HYPO 22GX1.5 SAFETY (NEEDLE) ×3 IMPLANT
NS IRRIG 1000ML POUR BTL (IV SOLUTION) ×3 IMPLANT
OIL CARTRIDGE MAESTRO DRILL (MISCELLANEOUS) ×2
PACK LAMINECTOMY NEURO (CUSTOM PROCEDURE TRAY) ×3 IMPLANT
PAD ARMBOARD 7.5X6 YLW CONV (MISCELLANEOUS) ×9 IMPLANT
PATTIES SURGICAL .5 X.5 (GAUZE/BANDAGES/DRESSINGS) ×1 IMPLANT
PATTIES SURGICAL .5 X1 (DISPOSABLE) ×1 IMPLANT
PATTIES SURGICAL 1X1 (DISPOSABLE) ×1 IMPLANT
POUCH TYRX ANTIBAC NEURO MED (Neuro Prosthesis/Implant) IMPLANT
RASP 3.0MM (RASP) ×1 IMPLANT
RECHARGER INTELLIS (NEUROSURGERY SUPPLIES) ×1 IMPLANT
REPROGRAMMER INTELLIS (NEUROSURGERY SUPPLIES) ×1 IMPLANT
ROD 90MM (Rod) ×2 IMPLANT
ROD RADIUS NO-HEX 80MM (Rod) ×1 IMPLANT
ROD SPNL 90X5.5XNS TI RDS (Rod) IMPLANT
SCREW 7.75X50MM (Screw) ×2 IMPLANT
SLEEVE SURGEON STRL (DRAPES) ×1 IMPLANT
SPONGE LAP 4X18 X RAY DECT (DISPOSABLE) IMPLANT
SPONGE NEURO XRAY DETECT 1X3 (DISPOSABLE) ×1 IMPLANT
SPONGE SURGIFOAM ABS GEL 100 (HEMOSTASIS) ×2 IMPLANT
SPONGE SURGIFOAM ABS GEL SZ50 (HEMOSTASIS) ×1 IMPLANT
STAPLER SKIN PROX WIDE 3.9 (STAPLE) ×1 IMPLANT
STRIP BIOACTIVE 20CC 25X100X8 (Miscellaneous) ×1 IMPLANT
STRIP CLOSURE SKIN 1/2X4 (GAUZE/BANDAGES/DRESSINGS) ×4 IMPLANT
SUT SILK 0 TIES 10X30 (SUTURE) IMPLANT
SUT SILK 2 0 FS (SUTURE) ×3 IMPLANT
SUT SILK 2 0 TIES 10X30 (SUTURE) ×1 IMPLANT
SUT VIC AB 1 CT1 18XBRD ANBCTR (SUTURE) ×3 IMPLANT
SUT VIC AB 1 CT1 8-18 (SUTURE) ×4
SUT VIC AB 2-0 CP2 18 (SUTURE) ×6 IMPLANT
TAPE CLOTH SURG 4X10 WHT LF (GAUZE/BANDAGES/DRESSINGS) ×1 IMPLANT
TOWEL GREEN STERILE (TOWEL DISPOSABLE) ×3 IMPLANT
TOWEL GREEN STERILE FF (TOWEL DISPOSABLE) ×3 IMPLANT
TRAY FOLEY W/METER SILVER 16FR (SET/KITS/TRAYS/PACK) ×2 IMPLANT
WATER STERILE IRR 1000ML POUR (IV SOLUTION) ×3 IMPLANT

## 2016-11-03 NOTE — Anesthesia Procedure Notes (Signed)
Procedure Name: Intubation Date/Time: 11/03/2016 7:40 AM Performed by: Candis Shine Pre-anesthesia Checklist: Patient identified, Emergency Drugs available, Suction available and Patient being monitored Patient Re-evaluated:Patient Re-evaluated prior to inductionOxygen Delivery Method: Circle System Utilized Preoxygenation: Pre-oxygenation with 100% oxygen Intubation Type: IV induction Ventilation: Mask ventilation without difficulty and Oral airway inserted - appropriate to patient size Laryngoscope Size: Mac and 4 Grade View: Grade I Tube type: Oral Tube size: 7.5 mm Number of attempts: 1 Airway Equipment and Method: Stylet and Oral airway Placement Confirmation: ETT inserted through vocal cords under direct vision,  positive ETCO2 and breath sounds checked- equal and bilateral Secured at: 22 cm Tube secured with: Tape Dental Injury: Teeth and Oropharynx as per pre-operative assessment

## 2016-11-03 NOTE — Transfer of Care (Signed)
Immediate Anesthesia Transfer of Care Note  Patient: [name]  Procedure(s) Performed: Procedure(s): LUMBAR SPINAL CORD STIMULATOR REVISION (N/A) LUMBAR ONE- LUMBAR TWO DECOMPRESSION, INSTRUMENTATION, FUSION (N/A)  Patient Location: PACU  Anesthesia Type:General  Level of Consciousness: drowsy and patient cooperative  Airway & Oxygen Therapy: Patient Spontanous Breathing and Patient connected to nasal cannula oxygen  Post-op Assessment: Report given to RN and Post -op Vital signs reviewed and stable  Post vital signs: Reviewed and stable  Last Vitals:  Vitals:   11/03/16 0604 11/03/16 0639  BP: (!) 150/92   Pulse: (!) 58   Resp: 18   Temp:  36.4 C    Last Pain:  Vitals:   11/03/16 0604  PainSc: 3          Complications: No apparent anesthesia complications

## 2016-11-03 NOTE — Op Note (Signed)
Brief history: The patient is a 66 year old white male on whom I performed several lumbar fusions. He has developed back and leg pain consistent with neurogenic claudication. He failed medical management and was worked up with a lumbar myelo CT. This demonstrated disc degeneration, stenosis, etc. at L1-2. I discussed the various treatment options with the patient including surgery. He has decided to proceed with an exploration of his lumbar fusion with a L1-2 decompression, instrumentation, and fusion. He also has a malfunctioning spinal cord stimulator and elected to have this revised.  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 Etc. requiring a wide decompression of the nerve roots.); L1 2 posterior lumbar interbody fusion with local morselized autograft bone and Kinnex graft extender; insertion of interbody prosthesis atL1-2(globus peek expandable interbody prosthesis); posterior segmental instrumentation from L1 toL3with globus titanium pedicle screws and rods; posterior lateral arthrodesis at L1-2 and L2-3 with local morselized autograft bone and Kinnex bone graft extender; revision of spinal cord stimulator   Surgeon: Dr. Earle Gell  AsDr. Pool  Anesthesia: Gen. endotracheal  Estimated blood loss 250 mL  Drains: None  Complications: None  Description of procedure: The patient was brought to the operating room by the anesthesia team. General endotracheal anesthesia was induced. The patient was turned to the prone position on the Wilson frame. The patient's lumbosacral  region was then prepared with Betadine scrub and Betadine solution. Sterile drapes were applied.  I then injected the area to be incised with Marcaine with epinephrine solution. I then used the scalpel to make a linear midline incision over the L1-2, L2-3 and L3-4 interspace. I then used electrocautery to perform a bilateral subperiosteal dissection exposing the spinous process and lamina of L1-L4, exposing the old hardware. We then obtained intraoperative radiograph to confirm our location. We then inserted the Verstrac retractor to provide exposure.  We began the exploration of the fusion by removing the caps from the old screws, we cut the rod just above the L4 pedicle screw. The fusion at L2-3 was questionable.   I began the decompression by using the high speed drill to perform laminotomies at L1-2 bilaterally. We then used the Kerrison punches to widen the laminotomy and removed the ligamentum flavum at L1-2 bilaterally. We used the Kerrison punches to remove the medial facets at L1-2 bilaterally. We performed wide foraminotomies about the bilateral L1 and L2 nerve roots completing the decompression.  We now turned our attention to the posterior lumbar interbody fusion. I used a scalpel to incise the intervertebral disc at L1-2 bilaterally. I then performed a partial intervertebral discectomy at L1-2 bilaterally using the pituitary forceps. We prepared the vertebral endplates at J6-7 bilaterally for the fusion by removing the soft tissues with the curettes. We then used the trial spacers to pick the appropriate sized interbody prosthesis. We prefilled his prosthesis with a combination of local morselized autograft bone that we obtained during the decompression as well as Kinnex bone graft extender. We inserted the prefilled prosthesis into the interspace at L1-2 bilaterally, we then expanded the prosthesis.. There was a good snug fit of the prosthesis in the interspace. We then filled and the remainder  of the intervertebral disc space with local morselized autograft bone and Kinnex. This completed the posterior lumbar interbody arthrodesis.  We now turned attention to the instrumentation. Under fluoroscopic guidance we cannulated the bilateral L1 pedicles with the bone probe. We then removed the bone probe. We then tapped the pedicle with a 6.25 millimeter tap. We then removed the tap. We probed inside the tapped pedicle with a ball probe to rule out cortical breaches. We then inserted a 7.75 x 50 millimeter pedicle screw into the  L1 pedicles bilaterally under fluoroscopic guidance. We then palpated along the medial aspect of the pedicles to rule out cortical breaches. There were none. The nerve roots were not injured. We then connected the unilateral pedicle screws  from L1-L3 with a lordotic rod. We compressed the construct and secured the rod in place with the caps. We then tightened the caps appropriately. This completed the instrumentation from L1-L3 bilaterally.  We now turned our attention to the posterior lateral arthrodesis at L1-2 and L2-3 bilaterally. We used the high-speed drill to decorticate the remainder of the facets, pars, transverse process at L1-2 and L2-3 bilaterally. We then applied a combination of local morselized autograft bone and Kinnex bone graft extender over these decorticated posterior lateral structures. This completed the posterior lateral arthrodesis.  We now turned attention to the revision of spinal cord stimulator. I made an incision over the generator scar. I used the Mestinon scissors and electrocautery to expose the old generator. We disconnected the leads from the old generator and placed a new generator. We secured the leads with the nuts. The generator was working well. Telemetry. We then reapproximated patient's subcutaneous tissue with interrupted 2-0 Vicryl suture and the skin with Steri-Strips and benzoin.   We then obtained hemostasis using bipolar  electrocautery. We irrigated the wound out with bacitracin solution. We inspected the thecal sac and nerve roots and noted they were well decompressed. We then removed the retractor. We placed vancomycin powder in the wound. We reapproximated patient's thoracolumbar fascia with interrupted #1 Vicryl suture. We reapproximated patient's subcutaneous tissue with interrupted 2-0 Vicryl suture. The reapproximated patient's skin with Steri-Strips and benzoin. The wound was then coated with bacitracin ointment. A sterile dressing was applied. The drapes were removed. The patient was subsequently returned to the supine position where they were extubated by the anesthesia team. He was then transported to the post anesthesia care unit in stable condition. All sponge instrument and needle counts were reportedly correct at the end of this case.

## 2016-11-03 NOTE — Progress Notes (Signed)
Patient ID: [name], male   DOB: 1951/04/05, 66 y.o.   MRN: [id number] Subjective:  The patient is alert and pleasant. He looks well. He is in no apparent distress.  Objective: Vital signs in last 24 hours: Temp:  [96.6 F (35.9 C)-98.5 F (36.9 C)] 98.5 F (36.9 C) (06/04 1445) Pulse Rate:  [58-87] 77 (06/04 1508) Resp:  [8-25] 14 (06/04 1508) BP: (118-161)/(73-101) 121/76 (06/04 1508) SpO2:  [90 %-97 %] 95 % (06/04 1508) Weight:  [106.1 kg (234 lb)] 106.1 kg (234 lb) (06/04 0604)  Intake/Output from previous day: No intake/output data recorded. Intake/Output this shift: Total I/O In: 1400 [I.V.:1400] Out: 895 [Urine:745; Blood:150]  Physical exam the patient is alert and pleasant. He is moving his lower extremities well. His dressing is clean and dry.  Lab Results: No results for input(s): WBC, HGB, HCT, PLT in the last 72 hours. BMET No results for input(s): NA, K, CL, CO2, GLUCOSE, BUN, CREATININE, CALCIUM in the last 72 hours.  Studies/Results: Dg Thoracolumabar Spine  Result Date: 11/03/2016 CLINICAL DATA:  L1-L2 decompression and fusion EXAM: THORACOLUMBAR SPINE 1V COMPARISON:  None. FINDINGS: Placement of posterior pedicle screws at L1. Remote changes of posterior changes from L2 inferiorly. No hardware complicating feature. IMPRESSION: As above. Electronically Signed   By: Rolm Baptise M.D.   On: 11/03/2016 11:10   Dg Lumbar Spine 1 View  Result Date: 11/03/2016 CLINICAL DATA:  Intraoperative localization. EXAM: LUMBAR SPINE - 1 VIEW COMPARISON:  07/11/2016 FINDINGS: Spinal numbering as on preoperative myelogram. A surgical probe projects just posterior and inferior to the L1-2 disc space. Status post L2-S1 fusion. IMPRESSION: Surgical probe projects posterior and slightly inferior to the L1-2 disc space. Electronically Signed   By: Monte Fantasia M.D.   On: 11/03/2016 09:59   Dg C-arm 1-60 Min  Result Date: 11/03/2016 CLINICAL DATA:  L1-L2 decompression and  fusion EXAM: THORACOLUMBAR SPINE 1V COMPARISON:  None. FINDINGS: Placement of posterior pedicle screws at L1. Remote changes of posterior changes from L2 inferiorly. No hardware complicating feature. IMPRESSION: As above. Electronically Signed   By: Rolm Baptise M.D.   On: 11/03/2016 11:10    Assessment/Plan: The patient is doing well. We will mobilize him. He may go home tomorrow.  LOS: 0 days     Jala Dundon D 11/03/2016, 4:18 PM

## 2016-11-03 NOTE — Evaluation (Signed)
Physical Therapy Evaluation and Discharge Patient Details Name: [name] MRN: [id number] DOB: 1951/02/21 Today's Date: 11/03/2016   History of Present Illness  Pt is a 66yo male s/p L1-2 lumbar fusion and replacement of spinal cord stimulator. Prior to surgery he was complaining of recurrent back and leg pain consistent with neurogenic claudication. Pt has undergone several back surgeries.  Clinical Impression  Pt limited due to nausea and lethargy/drowsiness secondary to anesthesia after surgery. Pt very motivated to get up and walk. Pt able to complete ~492ft and 10 stairs with hand held assistance due to drowsiness and minimal instability. Once medication side effects resolve pt will be independent with mobilization. No follow up PT recommended due to pt demonstrating good functional mobility and acceptable knowledge of precautions and safety with return home. Pt's wife very supportive and can provide assistance as needed.     Follow Up Recommendations No PT follow up    Equipment Recommendations  None recommended by PT    Recommendations for Other Services       Precautions / Restrictions Precautions Precautions: Back Precaution Booklet Issued: Yes (comment) Precaution Comments: Reviewed spinal precautions with pt and pt's wife Required Braces or Orthoses: Spinal Brace Spinal Brace: Lumbar corset;Applied in sitting position Restrictions Weight Bearing Restrictions: No      Mobility  Bed Mobility Overal bed mobility: Modified Independent             General bed mobility comments: use of bed rails for supine to sit   Transfers Overall transfer level: Needs assistance Equipment used: 2 person hand held assist Transfers: Sit to/from Stand Sit to Stand: Min assist         General transfer comment: Pt required min A for stability upon standing due to pt very drowsy from anesthesia. Pt also nauseous upon standing.  Ambulation/Gait Ambulation/Gait assistance: Min  assist Ambulation Distance (Feet): 400 Feet Assistive device: 2 person hand held assist Gait Pattern/deviations: Step-through pattern Gait velocity: increased Gait velocity interpretation: at or above normal speed for age/gender General Gait Details: Pt required 2 person HHA for stability and VCs to keep eyes open during session. Pt impulsive and wanted to increase gait speed throughout  Stairs Stairs: Yes Stairs assistance: Min assist Stair Management: One rail Right;Alternating pattern;Backwards (one rail and 1 hand held assist) Number of Stairs: 10 General stair comments: Pt impulsive and required VCs to slow down with ascent.  Wheelchair Mobility    Modified Rankin (Stroke Patients Only)       Balance Overall balance assessment: Needs assistance Sitting-balance support: No upper extremity supported;Feet supported Sitting balance-Leahy Scale: Good     Standing balance support: Single extremity supported;During functional activity Standing balance-Leahy Scale: Poor Standing balance comment: Poor balance secondary to anesthesia effects                             Pertinent Vitals/Pain Pain Assessment: Faces Faces Pain Scale: Hurts even more Pain Location: low back  Pain Descriptors / Indicators: Aching;Discomfort;Sore Pain Intervention(s): Monitored during session    Home Living Family/patient expects to be discharged to:: Private residence Living Arrangements: Spouse/significant other Available Help at Discharge: Family Type of Home: House Home Access: Stairs to enter Entrance Stairs-Rails: Can reach both Entrance Stairs-Number of Steps: 4 Home Layout: Two level;Able to live on main level with bedroom/bathroom Home Equipment: Walker - 2 wheels Additional Comments: pt has walker at home but does not use it  Prior Function Level of Independence: Independent               Hand Dominance   Dominant Hand: Right    Extremity/Trunk Assessment    Upper Extremity Assessment Upper Extremity Assessment: Overall WFL for tasks assessed    Lower Extremity Assessment Lower Extremity Assessment: Overall WFL for tasks assessed       Communication   Communication: No difficulties  Cognition Arousal/Alertness: Lethargic Behavior During Therapy: Impulsive Overall Cognitive Status: Within Functional Limits for tasks assessed                                 General Comments: Pt lethargic from recent surgery, but very motivated to walk      General Comments General comments (skin integrity, edema, etc.): pt's wife present for session. PT provided education regarding pain management, safety with mobility, spinal precautions, activity expectations, and safety with car transfers    Exercises     Assessment/Plan    PT Assessment Patent does not need any further PT services  PT Problem List         PT Treatment Interventions      PT Goals (Current goals can be found in the Care Plan section)  Acute Rehab PT Goals Patient Stated Goal: to go home PT Goal Formulation: With patient/family Time For Goal Achievement: 11/17/16 Potential to Achieve Goals: Good    Frequency     Barriers to discharge        Co-evaluation               AM-PAC PT "6 Clicks" Daily Activity  Outcome Measure Difficulty turning over in bed (including adjusting bedclothes, sheets and blankets)?: None Difficulty moving from lying on back to sitting on the side of the bed? : None Difficulty sitting down on and standing up from a chair with arms (e.g., wheelchair, bedside commode, etc,.)?: None Help needed moving to and from a bed to chair (including a wheelchair)?: A Little Help needed walking in hospital room?: A Little Help needed climbing 3-5 steps with a railing? : A Little 6 Click Score: 21    End of Session Equipment Utilized During Treatment: Gait belt;Back brace Activity Tolerance: Patient tolerated treatment well Patient  left: in bed;with call bell/phone within reach;with family/visitor present Nurse Communication: Mobility status PT Visit Diagnosis: Unsteadiness on feet (R26.81);Other abnormalities of gait and mobility (R26.89)    Time: 1515-1550 PT Time Calculation (min) (ACUTE ONLY): 35 min   Charges:   PT Evaluation $PT Eval Low Complexity: 1 Procedure PT Treatments $Gait Training: 8-22 mins   PT G CodesLoma Sousa, SPT  [phone]  Melvenia Beam 11/03/2016, 4:04 PM

## 2016-11-03 NOTE — Anesthesia Preprocedure Evaluation (Signed)
Anesthesia Evaluation  Patient identified by MRN, date of birth, ID band Patient awake    Reviewed: Allergy & Precautions, NPO status , Patient's Chart, lab work & pertinent test results  Airway Mallampati: II  TM Distance: >3 FB Neck ROM: Full    Dental no notable dental hx.    Pulmonary neg pulmonary ROS, former smoker,    Pulmonary exam normal breath sounds clear to auscultation       Cardiovascular hypertension, + Peripheral Vascular Disease  Normal cardiovascular exam Rhythm:Regular Rate:Normal     Neuro/Psych negative neurological ROS  negative psych ROS   GI/Hepatic Neg liver ROS, GERD  ,  Endo/Other  negative endocrine ROS  Renal/GU negative Renal ROS  negative genitourinary   Musculoskeletal negative musculoskeletal ROS (+)   Abdominal   Peds negative pediatric ROS (+)  Hematology negative hematology ROS (+)   Anesthesia Other Findings   Reproductive/Obstetrics negative OB ROS                             Anesthesia Physical Anesthesia Plan  ASA: III  Anesthesia Plan: General   Post-op Pain Management:    Induction: Intravenous  Airway Management Planned: Oral ETT  Additional Equipment:   Intra-op Plan:   Post-operative Plan: Extubation in OR  Informed Consent: I have reviewed the patients History and Physical, chart, labs and discussed the procedure including the risks, benefits and alternatives for the proposed anesthesia with the patient or authorized representative who has indicated his/her understanding and acceptance.   Dental advisory given  Plan Discussed with: CRNA and Surgeon  Anesthesia Plan Comments:         Anesthesia Quick Evaluation

## 2016-11-03 NOTE — H&P (Signed)
Subjective: The patient is a 66 year old white male on whom I performed several previous lumbar fusions. He has developed recurrent back and leg pain consistent with neurogenic claudication. He has failed medical management and was worked up with a lumbar myelo CT. This demonstrated degenerative disc disease and spinal stenosis at L1 - 2. He also has a malfunctioning spinal cord stimulator. I discussed various treatment options with the patient including surgery. He has weighed the risks, benefits, and alternatives to surgery and decided proceed with an exploration with lumbar fusion, extension is fusion of L1-2, and replacement of spinal cord stimulator.  Past Medical History:  Diagnosis Date  . Anxiety    patient denies  . Aortic aneurysm (HCC)    ascending  . Arthritis   . Barrett esophagus   . Blood transfusion   . Cancer ([place]) 2007   melanoma stage 3 stomach bx  . Coronary artery disease    mild by 09/08/13 cath [facility]  . GERD (gastroesophageal reflux disease)    barretts esophagus  . H/O hiatal hernia   . Headache(784.0)   . Hypercholesteremia   . Hypertension   . PONV (postoperative nausea and vomiting)    trouble breathing after last surgery (at [facility])  . Shingles    10  . Sleep apnea    not tested  . 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 ABDOMINAL SURGERY     x3  . back surgeries     x 9  . BACK SURGERY     x8  . BIOPSY N/A 03/28/2013   Procedure: Distal Esophageal Biopsy;  Surgeon: Rogene Houston, MD;  Location: [facility];  Service: Endoscopy;  Laterality: N/A;  . BIOPSY  05/30/2016   Procedure: BIOPSY;  Surgeon: Rogene Houston, MD;  Location: [facility];  Service: Endoscopy;;  . CHEST SURGERY     lung incisional hernia for hernia  . ESOPHAGOGASTRODUODENOSCOPY (EGD) WITH PROPOFOL N/A 03/28/2013   Procedure: ESOPHAGOGASTRODUODENOSCOPY (EGD) WITH PROPOFOL;  Surgeon: Rogene Houston, MD;  Location: [facility];  Service:  Endoscopy;  Laterality: N/A;  GE junction @ 38, proximal  margin @37   . ESOPHAGOGASTRODUODENOSCOPY (EGD) WITH PROPOFOL N/A 05/30/2016   Procedure: ESOPHAGOGASTRODUODENOSCOPY (EGD) WITH PROPOFOL;  Surgeon: Rogene Houston, MD;  Location: [facility];  Service: Endoscopy;  Laterality: N/A;  955  . HEMORRHOID SURGERY     x2  . HERNIA REPAIR    . HIATAL HERNIA REPAIR    . JOINT REPLACEMENT     lft knee  . KNEE ARTHROPLASTY     lft  . KNEE ARTHROSCOPY Right   . LEG SURGERY     x4 crushed   . LITHOTRIPSY    . MELANOMA EXCISION     lft abdomen  . NECK SURGERY  2012  . POSTERIOR CERVICAL FUSION/FORAMINOTOMY  08/18/2011   Procedure: POSTERIOR CERVICAL FUSION/FORAMINOTOMY LEVEL 3;  Surgeon: Ophelia Charter, MD;  Location: [facility];  Service: Neurosurgery;  Laterality: N/A;  Cervical three-four, four-five, five-six posterior cervical  fusion with instrumentation; Right cervical three-four, four-five laminotomy  . REVISION TOTAL HIP ARTHROPLASTY Left   . SHOULDER ARTHROSCOPY     rt bith defect , spur, rotator cuff  . SPINAL CORD STIMULATOR IMPLANT     rt hip  . THYROID LOBECTOMY     rt    Allergies  Allergen Reactions  . Demerol Other (See Comments)    "Makes  me crazy."    Social History  Substance Use Topics  . Smoking status: Former Smoker    Packs/day: 4.00    Years: 10.00    Types: Cigarettes    Quit date: 08/12/1980  . Smokeless tobacco: Never Used     Comment: occ wine  . Alcohol use 0.0 oz/week     Comment: rarely    Family History  Problem Relation Age of Onset  . Heart disease Father   . Heart disease Paternal Grandmother   . Heart disease Paternal Grandfather   . Heart disease Maternal Grandfather   . Heart disease Brother        heart transplant  . Aneurysm Unknown        pt states several members have aortic aneurysm  . Skin cancer Unknown        several members  . Lymphoma Brother    Prior to Admission medications   Medication Sig Start Date End Date  Taking? Authorizing Provider  calcium-vitamin D (OSCAL WITH D) 500-200 MG-UNIT per tablet Take 1 tablet by mouth 2 (two) times daily.    Yes [provider]  dexlansoprazole (DEXILANT) 60 MG capsule Take 60 mg by mouth daily.   Yes [provider]  diazepam (VALIUM) 10 MG tablet Take 10 mg by mouth daily as needed for anxiety. Muscle spasms   Yes [provider]  fexofenadine (ALLEGRA) 180 MG tablet Take 180 mg by mouth every evening.   Yes [provider]  fish oil-omega-3 fatty acids 1000 MG capsule Take 1 g by mouth 2 (two) times daily.   Yes [provider]  gabapentin (NEURONTIN) 600 MG tablet Take 600 mg by mouth 3 (three) times daily.   Yes [provider]  lisinopril (PRINIVIL,ZESTRIL) 10 MG tablet Take 10 mg by mouth daily.   Yes [provider]  montelukast (SINGULAIR) 10 MG tablet Take 10 mg by mouth daily.    Yes [provider]  Multiple Vitamin (MULITIVITAMIN WITH MINERALS) TABS Take 1 tablet by mouth daily.   Yes [provider]  NON FORMULARY Take 10 mg by mouth 3 (three) times daily. Domperidone 10 mg  Digestive support [place]   Yes [provider]  ondansetron (ZOFRAN ODT) 4 MG disintegrating tablet 4mg  ODT q4 hours prn nausea/vomit Patient taking differently: Take 4 mg by mouth every 4 (four) hours as needed for nausea or vomiting.  05/20/16  Yes Milton Ferguson, MD  rosuvastatin (CRESTOR) 10 MG tablet Take 10 mg by mouth every evening.    Yes [provider]  oxyCODONE-acetaminophen (PERCOCET) 10-325 MG tablet Take 1 tablet by mouth every 4 (four) hours as needed for pain.    [provider]     Review of Systems  Positive ROS: As above, he has chronic neck and back pain  All other systems have been reviewed and were otherwise negative with the exception of those mentioned in the HPI and as above.  Objective: Vital signs in last 24 hours: Temp:  [97.6  F (36.4 C)] 97.6 F (36.4 C) (06/04 0639) Pulse Rate:  [58] 58 (06/04 0604) Resp:  [18] 18 (06/04 0604) BP: (150)/(92) 150/92 (06/04 0604) SpO2:  [95 %] 95 % (06/04 0604) Weight:  [106.1 kg (234 lb)] 106.1 kg (234 lb) (06/04 0604)  General Appearance: Alert Head: Normocephalic, without obvious abnormality, atraumatic Eyes: PERRL, conjunctiva/corneas clear, EOM's intact,    Ears: Normal  Throat: Normal  Neck: Supple, his cervical incisions are  well-healed. Back: unremarkable, his lumbar incisions are well-healed. Lungs: Clear to auscultation bilaterally, respirations unlabored Heart: Regular rate and rhythm, no murmur, rub or gallop Abdomen: Soft, non-tender Extremities: Extremities normal, atraumatic, no cyanosis or edema Skin: unremarkable  NEUROLOGIC:   Mental status: alert and oriented,Motor Exam - grossly normal Sensory Exam - grossly normal Reflexes:  Coordination - grossly normal Gait - grossly normal Balance - grossly normal Cranial Nerves: I: smell Not tested  II: visual acuity  OS: Normal  OD: Normal   II: visual fields Full to confrontation  II: pupils Equal, round, reactive to light  III,VII: ptosis None  III,IV,VI: extraocular muscles  Full ROM  V: mastication Normal  V: facial light touch sensation  Normal  V,VII: corneal reflex  Present  VII: facial muscle function - upper  Normal  VII: facial muscle function - lower Normal  VIII: hearing Not tested  IX: soft palate elevation  Normal  IX,X: gag reflex Present  XI: trapezius strength  5/5  XI: sternocleidomastoid strength 5/5  XI: neck flexion strength  5/5  XII: tongue strength  Normal    Data Review Lab Results  Component Value Date   WBC 3.3 (L) 10/23/2016   HGB 14.6 10/23/2016   HCT 43.9 10/23/2016   MCV 93.4 10/23/2016   PLT 107 (L) 10/23/2016   Lab Results  Component Value Date   NA 139 10/23/2016   K 4.1 10/23/2016   CL 106 10/23/2016   CO2 23 10/23/2016   BUN 18 10/23/2016    CREATININE 1.16 10/23/2016   GLUCOSE 99 10/23/2016   Lab Results  Component Value Date   INR 1.0 09/02/2007    Assessment/Plan: L1-2 degenerative disc disease, spinal stenosis, lumbago, lumbar radiculopathy, neurogenic claudication: I have discussed the situation with the patient. I have reviewed his imaging studies with him and pointed out the abnormalities. We have discussed the various treatment options including surgery. I have described the surgical treatment option of an exploration of his lumbar fusion with an L1-2 decompression, instrumentation, fusion and a replacement of the spinal cord stimulator. I have shown him surgical models. We have discussed the risks, benefits, alternatives, expected postoperative course, and likelihood of achieving goals with surgery. I have answered all patient's questions. He has decided proceed with surgery.   Gwenneth Whiteman D 11/03/2016 7:02 AM

## 2016-11-03 NOTE — Anesthesia Postprocedure Evaluation (Signed)
Anesthesia Post Note  Patient: [name]  Procedure(s) Performed: Procedure(s) (LRB): LUMBAR SPINAL CORD STIMULATOR REVISION (N/A) LUMBAR ONE- LUMBAR TWO DECOMPRESSION, INSTRUMENTATION, FUSION (N/A)     Patient location during evaluation: PACU Anesthesia Type: General Level of consciousness: awake and alert Pain management: pain level controlled Vital Signs Assessment: post-procedure vital signs reviewed and stable Respiratory status: spontaneous breathing, nonlabored ventilation, respiratory function stable and patient connected to nasal cannula oxygen Cardiovascular status: blood pressure returned to baseline and stable Postop Assessment: no signs of nausea or vomiting Anesthetic complications: no    Last Vitals:  Vitals:   11/03/16 1230 11/03/16 1247  BP: (!) 149/86 (!) 152/83  Pulse: 73 79  Resp: (!) 25 (!) 21  Temp:      Last Pain:  Vitals:   11/03/16 1200  PainSc: Asleep                 Kaydee Magel S

## 2016-11-04 ENCOUNTER — Encounter (HOSPITAL_COMMUNITY): Payer: Self-pay | Admitting: Neurosurgery

## 2016-11-04 LAB — TYPE AND SCREEN
ABO/RH(D): A POS
Antibody Screen: POSITIVE
DAT, IgG: NEGATIVE
Unit division: 0
Unit division: 0

## 2016-11-04 LAB — BASIC METABOLIC PANEL
Anion gap: 11 (ref 5–15)
BUN: 22 mg/dL — ABNORMAL HIGH (ref 6–20)
CO2: 26 mmol/L (ref 22–32)
Calcium: 8.7 mg/dL — ABNORMAL LOW (ref 8.9–10.3)
Chloride: 99 mmol/L — ABNORMAL LOW (ref 101–111)
Creatinine, Ser: 1.12 mg/dL (ref 0.61–1.24)
GFR calc Af Amer: >60 mL/min (ref >60)
GFR calc non Af Amer: >60 mL/min (ref >60)
Glucose, Bld: 130 mg/dL — ABNORMAL HIGH (ref 65–99)
Potassium: 4.7 mmol/L (ref 3.5–5.1)
Sodium: 136 mmol/L (ref 135–145)

## 2016-11-04 LAB — CBC
HCT: 39.9 % (ref 39.0–52.0)
Hemoglobin: 13.1 g/dL (ref 13.0–17.0)
MCH: 30.3 pg (ref 26.0–34.0)
MCHC: 32.8 g/dL (ref 30.0–36.0)
MCV: 92.4 fL (ref 78.0–100.0)
Platelets: 107 10*3/uL — ABNORMAL LOW (ref 150–400)
RBC: 4.32 MIL/uL (ref 4.22–5.81)
RDW: 12.7 % (ref 11.5–15.5)
WBC: 9.5 10*3/uL (ref 4.0–10.5)

## 2016-11-04 LAB — BPAM RBC
Blood Product Expiration Date: 201806072359
Blood Product Expiration Date: 201806072359
Unit Type and Rh: 6200
Unit Type and Rh: 6200

## 2016-11-04 MED ORDER — TRAMADOL HCL 50 MG PO TABS
100.0000 mg | ORAL_TABLET | Freq: Four times a day (QID) | ORAL | Status: DC | PRN
  Administered 2016-11-04 (×3): 100 mg via ORAL
  Filled 2016-11-04 (×3): qty 2

## 2016-11-04 MED FILL — Heparin Sodium (Porcine) Inj 1000 Unit/ML: INTRAMUSCULAR | Qty: 30 | Status: AC

## 2016-11-04 MED FILL — Sodium Chloride IV Soln 0.9%: INTRAVENOUS | Qty: 1000 | Status: AC

## 2016-11-04 NOTE — Progress Notes (Signed)
Patient ID: [name], male   DOB: [birth year], 66 y.o.   MRN: [id number] Subjective:  Is alert and pleasant. He's had some nausea and vomiting.  Objective: Vital signs in last 24 hours: Temp:  [96.6 F (35.9 C)-98.5 F (36.9 C)] 97.8 F (36.6 C) (06/05 0400) Pulse Rate:  [53-87] 53 (06/05 0400) Resp:  [8-25] 20 (06/05 0400) BP: (113-161)/(57-101) 125/57 (06/05 0400) SpO2:  [90 %-100 %] 100 % (06/05 0400)  Intake/Output from previous day: 06/04 0701 - 06/05 0700 In: 1500 [P.O.:100; I.V.:1400] Out: 2020 [Urine:1870; Blood:150] Intake/Output this shift: No intake/output data recorded.  Physical exam the patient is alert and pleasant. He is moving his lower extremities well. His dressing is clean and dry.  Lab Results:  Recent Labs  11/04/16 0225  WBC 9.5  HGB 13.1  HCT 39.9  PLT 107*   BMET  Recent Labs  11/04/16 0225  NA 136  K 4.7  CL 99*  CO2 26  GLUCOSE 130*  BUN 22*  CREATININE 1.12  CALCIUM 8.7*    Studies/Results: Dg Thoracolumabar Spine  Result Date: 11/03/2016 CLINICAL DATA:  L1-L2 decompression and fusion EXAM: THORACOLUMBAR SPINE 1V COMPARISON:  None. FINDINGS: Placement of posterior pedicle screws at L1. Remote changes of posterior changes from L2 inferiorly. No hardware complicating feature. IMPRESSION: As above. Electronically Signed   By: Rolm Baptise M.D.   On: 11/03/2016 11:10   Dg Lumbar Spine 1 View  Result Date: 11/03/2016 CLINICAL DATA:  Intraoperative localization. EXAM: LUMBAR SPINE - 1 VIEW COMPARISON:  07/11/2016 FINDINGS: Spinal numbering as on preoperative myelogram. A surgical probe projects just posterior and inferior to the L1-2 disc space. Status post L2-S1 fusion. IMPRESSION: Surgical probe projects posterior and slightly inferior to the L1-2 disc space. Electronically Signed   By: Monte Fantasia M.D.   On: 11/03/2016 09:59   Dg C-arm 1-60 Min  Result Date: 11/03/2016 CLINICAL DATA:  L1-L2 decompression and fusion EXAM:  THORACOLUMBAR SPINE 1V COMPARISON:  None. FINDINGS: Placement of posterior pedicle screws at L1. Remote changes of posterior changes from L2 inferiorly. No hardware complicating feature. IMPRESSION: As above. Electronically Signed   By: Rolm Baptise M.D.   On: 11/03/2016 11:10    Assessment/Plan: Postop day #1: We will mobilize the patient with physical therapy.  Nausea and vomiting: He doesn't tolerate narcotics well. I'll change him to Ultram. He may go home tomorrow.  LOS: 1 day     [name] D 11/04/2016, 7:39 AM

## 2016-11-04 NOTE — Progress Notes (Signed)
Patient refused CPAP hs tonight. Patient states he has been throwing up today and he doesn't fell well and does not want to wear it tonight.

## 2016-11-04 NOTE — Progress Notes (Signed)
AM medications held at this time per pt/family request until nausea subside.   Ave Filter, RN

## 2016-11-05 MED ORDER — DOCUSATE SODIUM 100 MG PO CAPS: 100.0000 mg | ORAL_CAPSULE | Freq: Two times a day (BID) | ORAL | 0 refills | Status: DC

## 2016-11-05 MED ORDER — HYDROXYZINE HCL 25 MG PO TABS: 25.0000 mg | ORAL_TABLET | Freq: Four times a day (QID) | ORAL | 0 refills | Status: DC | PRN

## 2016-11-05 MED ORDER — TRAMADOL HCL 50 MG PO TABS: 100.0000 mg | ORAL_TABLET | Freq: Four times a day (QID) | ORAL | 1 refills | Status: DC | PRN

## 2016-11-05 MED ORDER — CYCLOBENZAPRINE HCL 10 MG PO TABS: 10.0000 mg | ORAL_TABLET | Freq: Three times a day (TID) | ORAL | 1 refills | Status: DC | PRN

## 2016-11-05 NOTE — Discharge Summary (Signed)
Physician Discharge Summary  Patient ID: [name] MRN: [id number] DOB/AGE: 02-17-1951 66 y.o.  Admit date: 11/03/2016 Discharge date: 11/05/2016  Admission Diagnoses:Lumbar spinal stenosis, lumbar radiculopathy, neurogenic claudication, malfunctioning spinal cord stimulator  Discharge Diagnoses: The same Active Problems:   Lumbar stenosis with neurogenic claudication   Discharged Condition: good  Hospital Course: I performed an exploration of the patient's lumbar fusion with an L1 to an L2-3 decompression, fusion and revision of a spinal cord stimulator on the patient on 11/03/2016. The surgery went well.  The patient's postoperative course was remarkable only for some nausea and vomiting. He couldn't tolerate opioid pain medications. He was started on Ultram and Vistaril. His nausea and vomiting resolved.  On postoperative day #2 the patient requested discharge to home. The patient, and his wife, were given written and oral discharge instructions. All their questions were answered.  Consults: Physical therapy Significant Diagnostic Studies: None Treatments: Exploration of lumbar fusion, revision of spinal stimulator, L1-2 decompression, posterior lumbar interbody fusion, L1-2 and L2-3 posterior lateral arthrodesis, posterior segmental instrumentation Discharge Exam: Blood pressure 138/64, pulse (!) 54, temperature 98 F (36.7 C), resp. rate 16, height 6' (1.829 m), weight 106.1 kg (234 lb), SpO2 96 %. The patient is alert and pleasant. He looks well. His strength is normal in his lower extremities. His dressing is clean and dry.  Disposition: Home  Discharge Instructions    Call MD for:  difficulty breathing, headache or visual disturbances    Complete by:  As directed    Call MD for:  extreme fatigue    Complete by:  As directed    Call MD for:  hives    Complete by:  As directed    Call MD for:  persistant dizziness or light-headedness    Complete by:  As directed    Call MD for:  persistant nausea and vomiting    Complete by:  As directed    Call MD for:  redness, tenderness, or signs of infection (pain, swelling, redness, odor or green/yellow discharge around incision site)    Complete by:  As directed    Call MD for:  severe uncontrolled pain    Complete by:  As directed    Call MD for:  temperature >100.4    Complete by:  As directed    Diet - low sodium heart healthy    Complete by:  As directed    Discharge instructions    Complete by:  As directed    Call [phone] for a followup appointment. Take a stool softener while you are using pain medications.   Driving Restrictions    Complete by:  As directed    Do not drive for 2 weeks.   Increase activity slowly    Complete by:  As directed    Lifting restrictions    Complete by:  As directed    Do not lift more than 5 pounds. No excessive bending or twisting.   May shower / Bathe    Complete by:  As directed    He may shower after the pain she is removed 3 days after surgery. Leave the incision alone.   Remove dressing in 24 hours    Complete by:  As directed      Allergies as of 11/05/2016      Reactions   Demerol Other (See Comments)   "Makes me crazy."      Medication List    STOP taking these medications   diazepam 10  MG tablet Commonly known as:  VALIUM   oxyCODONE-acetaminophen 10-325 MG tablet Commonly known as:  PERCOCET     TAKE these medications   calcium-vitamin D 500-200 MG-UNIT tablet Commonly known as:  OSCAL WITH D Take 1 tablet by mouth 2 (two) times daily.   cyclobenzaprine 10 MG tablet Commonly known as:  FLEXERIL Take 1 tablet (10 mg total) by mouth 3 (three) times daily as needed for muscle spasms.   DEXILANT 60 MG capsule Generic drug:  dexlansoprazole Take 60 mg by mouth daily.   docusate sodium 100 MG capsule Commonly known as:  COLACE Take 1 capsule (100 mg total) by mouth 2 (two) times daily.   fexofenadine 180 MG tablet Commonly known as:   ALLEGRA Take 180 mg by mouth every evening.   fish oil-omega-3 fatty acids 1000 MG capsule Take 1 g by mouth 2 (two) times daily.   gabapentin 600 MG tablet Commonly known as:  NEURONTIN Take 600 mg by mouth 3 (three) times daily.   hydrOXYzine 25 MG tablet Commonly known as:  ATARAX/VISTARIL Take 1 tablet (25 mg total) by mouth every 6 (six) hours as needed for nausea or vomiting.   lisinopril 10 MG tablet Commonly known as:  PRINIVIL,ZESTRIL Take 10 mg by mouth daily.   montelukast 10 MG tablet Commonly known as:  SINGULAIR Take 10 mg by mouth daily.   multivitamin with minerals Tabs tablet Take 1 tablet by mouth daily.   NON FORMULARY Take 10 mg by mouth 3 (three) times daily. Domperidone 10 mg  Digestive support [place]   ondansetron 4 MG disintegrating tablet Commonly known as:  ZOFRAN ODT 4mg  ODT q4 hours prn nausea/vomit What changed:  how much to take  how to take this  when to take this  reasons to take this  additional instructions   rosuvastatin 10 MG tablet Commonly known as:  CRESTOR Take 10 mg by mouth every evening.   traMADol 50 MG tablet Commonly known as:  ULTRAM Take 2 tablets (100 mg total) by mouth every 6 (six) hours as needed for moderate pain.        SignedOphelia Charter 11/05/2016, 8:43 AM

## 2016-11-05 NOTE — Progress Notes (Signed)
Patient is discharged form room 3C08 at this time. Alert and in stable condition. IV site d/c'd and instructions read to patient and wife with understanding verbalized. Left unit via wheelchair with all belongings at side.

## 2016-12-16 DIAGNOSIS — M48061 Spinal stenosis, lumbar region without neurogenic claudication: Secondary | ICD-10-CM | POA: Diagnosis not present

## 2017-01-06 DIAGNOSIS — M1711 Unilateral primary osteoarthritis, right knee: Secondary | ICD-10-CM | POA: Diagnosis not present

## 2017-01-06 DIAGNOSIS — M76891 Other specified enthesopathies of right lower limb, excluding foot: Secondary | ICD-10-CM | POA: Diagnosis not present

## 2017-01-06 DIAGNOSIS — Z9889 Other specified postprocedural states: Secondary | ICD-10-CM | POA: Diagnosis not present

## 2017-01-06 DIAGNOSIS — M25561 Pain in right knee: Secondary | ICD-10-CM | POA: Diagnosis not present

## 2017-01-06 DIAGNOSIS — M2351 Chronic instability of knee, right knee: Secondary | ICD-10-CM | POA: Diagnosis not present

## 2017-01-20 DIAGNOSIS — M2351 Chronic instability of knee, right knee: Secondary | ICD-10-CM | POA: Diagnosis not present

## 2017-01-20 DIAGNOSIS — M25561 Pain in right knee: Secondary | ICD-10-CM | POA: Diagnosis not present

## 2017-01-27 ENCOUNTER — Ambulatory Visit (INDEPENDENT_AMBULATORY_CARE_PROVIDER_SITE_OTHER): Payer: Medicare Other | Admitting: Internal Medicine

## 2017-01-27 ENCOUNTER — Encounter (INDEPENDENT_AMBULATORY_CARE_PROVIDER_SITE_OTHER): Payer: Self-pay

## 2017-01-27 ENCOUNTER — Encounter (INDEPENDENT_AMBULATORY_CARE_PROVIDER_SITE_OTHER): Payer: Self-pay | Admitting: Internal Medicine

## 2017-01-27 VITALS — BP 130/76 | HR 68 | Temp 97.6°F | Resp 18 | Ht 72.0 in | Wt 241.9 lb

## 2017-01-27 DIAGNOSIS — K3184 Gastroparesis: Secondary | ICD-10-CM

## 2017-01-27 DIAGNOSIS — K2100 Gastro-esophageal reflux disease with esophagitis, without bleeding: Secondary | ICD-10-CM

## 2017-01-27 NOTE — Patient Instructions (Addendum)
Screening colonoscopy to be scheduled in February 2019. Keep ibuprofen use no more than 400 mg 3 times a day and take it with food.

## 2017-01-27 NOTE — Progress Notes (Signed)
Presenting complaint;  Follow-up for GERD and gastroparesis.  Subjective:  Ration is 66 year old Caucasian male who is here for scheduled visit. He was last seen 6 months ago. He is accompanied by his wife. They got married about 3 months ago. He was having diarrhea with domperidone 3 times a day. Since he has dropped the dose to twice daily he is having formed stools every other day. He denies abdominal pain melena or rectal bleeding. He has heartburn no more than once a week and it usually is transient. He denies dysphagia nausea or vomiting. He had right knee arthroscopy few months ago and one day after surgery he injured his knee. He has been having daily knee pain worse on walking. He has been using ibuprofen 800 mg 2-3 times a day. He believes he is not having any side effects. He also wants to know the timing of his next screening colonoscopy.  Current Medications: Outpatient Encounter Prescriptions as of 01/27/2017  Medication Sig  . calcium-vitamin D (OSCAL WITH D) 500-200 MG-UNIT per tablet Take 1 tablet by mouth 2 (two) times daily.   . cyclobenzaprine (FLEXERIL) 10 MG tablet Take 1 tablet (10 mg total) by mouth 3 (three) times daily as needed for muscle spasms.  Marland Kitchen dexlansoprazole (DEXILANT) 60 MG capsule Take 60 mg by mouth daily.  . fexofenadine (ALLEGRA) 180 MG tablet Take 180 mg by mouth every evening.  . fish oil-omega-3 fatty acids 1000 MG capsule Take 1 g by mouth 2 (two) times daily.  Marland Kitchen gabapentin (NEURONTIN) 600 MG tablet Take 600 mg by mouth 3 (three) times daily.  . hydrOXYzine (ATARAX/VISTARIL) 25 MG tablet Take 1 tablet (25 mg total) by mouth every 6 (six) hours as needed for nausea or vomiting.  Marland Kitchen ibuprofen (ADVIL,MOTRIN) 800 MG tablet Take 800 mg by mouth every 8 (eight) hours as needed.  Marland Kitchen lisinopril (PRINIVIL,ZESTRIL) 10 MG tablet Take 10 mg by mouth daily.  . montelukast (SINGULAIR) 10 MG tablet Take 10 mg by mouth daily.   . Multiple Vitamin (MULITIVITAMIN WITH  MINERALS) TABS Take 1 tablet by mouth daily.  . NON FORMULARY Take 10 mg by mouth 3 (three) times daily. Domperidone 10 mg  Dig[place]  . Nutritional Supplements (ANTI-INFLAMMATORY ENZYME) CAPS APPLY 1 2 GMS TO AFFECTED AREA 3 4 TIMES DAILY AS NEEDED FOR PAIN  THIS IS A COMPOUNDED CREAM 1 PUMP EQUALS 1 GRAM  . ondansetron (ZOFRAN ODT) 4 MG disintegrating tablet 4mg  ODT q4 hours prn nausea/vomit (Patient taking differently: Take 4 mg by mouth every 4 (four) hours as needed for nausea or vomiting. )  . PRESCRIPTION MEDICATION Domperidone 10 mg - Patient is taking BID.  Marland Kitchen rosuvastatin (CRESTOR) 10 MG tablet Take 10 mg by mouth every evening.   . traMADol (ULTRAM) 50 MG tablet Take 2 tablets (100 mg total) by mouth every 6 (six) hours as needed for moderate pain.  . [DISCONTINUED] docusate sodium (COLACE) 100 MG capsule Take 1 capsule (100 mg total) by mouth 2 (two) times daily. (Patient not taking: Reported on 01/27/2017)   No facility-administered encounter medications on file as of 01/27/2017.      Objective: Blood pressure 130/76, pulse 68, temperature 97.6 F (36.4 C), temperature source Oral, resp. rate 18, height 6' (1.829 m), weight 241 lb 14.4 oz (109.7 kg). Patient is alert and in no acute distress. Conjunctiva is pink. Sclera is nonicteric Oropharyngeal mucosa is normal. No neck masses or thyromegaly noted. Cardiac exam with regular rhythm normal  S1 and S2. No murmur or gallop noted. Lungs are clear to auscultation. Abdomen is full but soft and nontender without organomegaly or masses. No LE edema or clubbing noted.   Assessment:  #1. Chronic GERD. He has had complicated course. He has had multiple upper GI bleeds. He also has history of Barrett's and has undergone surgery on 3 different occasions. Last few times esophageal biopsy was negative for Barrett's. Presently he is doing well with therapy.  #2. Gastroparesis. This may be related to prior surgery  and vagal injury. He is doing well with dietary measures and 20 mg of domperidone per day.  #3. He is average risk for CRC. Last colonoscopy was in December 2008.   Plan:  Patient advised to decrease NSAID use. He should not take more than 40 mg 3 times a day of ibuprofen as needed. Continue Dexilant and domperidone at current dose. Screening colonoscopy to be scheduled in February 2019. Office visit in one year.

## 2017-02-10 DIAGNOSIS — R0789 Other chest pain: Secondary | ICD-10-CM | POA: Diagnosis not present

## 2017-02-10 DIAGNOSIS — R0781 Pleurodynia: Secondary | ICD-10-CM | POA: Diagnosis not present

## 2017-02-10 DIAGNOSIS — G894 Chronic pain syndrome: Secondary | ICD-10-CM | POA: Diagnosis not present

## 2017-02-10 DIAGNOSIS — Z1389 Encounter for screening for other disorder: Secondary | ICD-10-CM | POA: Diagnosis not present

## 2017-02-11 ENCOUNTER — Other Ambulatory Visit (HOSPITAL_COMMUNITY): Payer: Self-pay | Admitting: Family Medicine

## 2017-02-11 ENCOUNTER — Ambulatory Visit (HOSPITAL_COMMUNITY)
Admission: RE | Admit: 2017-02-11 | Discharge: 2017-02-11 | Disposition: A | Discharge status: Home / Self Care | Payer: Medicare Other | Source: Ambulatory Visit | Attending: Family Medicine | Admitting: Family Medicine

## 2017-02-11 DIAGNOSIS — R0789 Other chest pain: Secondary | ICD-10-CM

## 2017-02-11 DIAGNOSIS — R0781 Pleurodynia: Secondary | ICD-10-CM | POA: Insufficient documentation

## 2017-02-11 DIAGNOSIS — M954 Acquired deformity of chest and rib: Secondary | ICD-10-CM | POA: Diagnosis not present

## 2017-03-26 DIAGNOSIS — I10 Essential (primary) hypertension: Secondary | ICD-10-CM | POA: Diagnosis not present

## 2017-03-26 DIAGNOSIS — E782 Mixed hyperlipidemia: Secondary | ICD-10-CM | POA: Diagnosis not present

## 2017-03-26 DIAGNOSIS — I719 Aortic aneurysm of unspecified site, without rupture: Secondary | ICD-10-CM | POA: Diagnosis not present

## 2017-03-26 DIAGNOSIS — E785 Hyperlipidemia, unspecified: Secondary | ICD-10-CM | POA: Diagnosis not present

## 2017-03-26 DIAGNOSIS — G894 Chronic pain syndrome: Secondary | ICD-10-CM | POA: Diagnosis not present

## 2017-03-26 DIAGNOSIS — Z01810 Encounter for preprocedural cardiovascular examination: Secondary | ICD-10-CM | POA: Diagnosis not present

## 2017-04-02 ENCOUNTER — Encounter (HOSPITAL_COMMUNITY): Payer: Self-pay | Admitting: Oncology

## 2017-04-02 ENCOUNTER — Encounter (HOSPITAL_COMMUNITY): Payer: Medicare Other | Attending: Oncology | Admitting: Oncology

## 2017-04-02 ENCOUNTER — Encounter (HOSPITAL_COMMUNITY): Payer: Medicare Other

## 2017-04-02 DIAGNOSIS — D696 Thrombocytopenia, unspecified: Secondary | ICD-10-CM | POA: Insufficient documentation

## 2017-04-02 LAB — COMPREHENSIVE METABOLIC PANEL
ALT: 19 U/L (ref 17–63)
AST: 24 U/L (ref 15–41)
Albumin: 4.2 g/dL (ref 3.5–5.0)
Alkaline Phosphatase: 71 U/L (ref 38–126)
Anion gap: 8 (ref 5–15)
BUN: 24 mg/dL — ABNORMAL HIGH (ref 6–20)
CO2: 24 mmol/L (ref 22–32)
Calcium: 9.8 mg/dL (ref 8.9–10.3)
Chloride: 106 mmol/L (ref 101–111)
Creatinine, Ser: 1.01 mg/dL (ref 0.61–1.24)
GFR calc Af Amer: >60 mL/min (ref >60)
GFR calc non Af Amer: >60 mL/min (ref >60)
Glucose, Bld: 90 mg/dL (ref 65–99)
Potassium: 4 mmol/L (ref 3.5–5.1)
Sodium: 138 mmol/L (ref 135–145)
Total Bilirubin: 0.6 mg/dL (ref 0.3–1.2)
Total Protein: 7.4 g/dL (ref 6.5–8.1)

## 2017-04-02 LAB — CBC WITH DIFFERENTIAL/PLATELET
Basophils Absolute: 0 10*3/uL (ref 0.0–0.1)
Basophils Relative: 0 %
Eosinophils Absolute: 0 10*3/uL (ref 0.0–0.7)
Eosinophils Relative: 1 %
HCT: 43.2 % (ref 39.0–52.0)
Hemoglobin: 14.5 g/dL (ref 13.0–17.0)
Lymphocytes Relative: 42 %
Lymphs Abs: 1.3 10*3/uL (ref 0.7–4.0)
MCH: 30.3 pg (ref 26.0–34.0)
MCHC: 33.6 g/dL (ref 30.0–36.0)
MCV: 90.4 fL (ref 78.0–100.0)
Monocytes Absolute: 0.2 10*3/uL (ref 0.1–1.0)
Monocytes Relative: 7 %
Neutro Abs: 1.6 10*3/uL — ABNORMAL LOW (ref 1.7–7.7)
Neutrophils Relative %: 50 %
Platelets: 116 10*3/uL — ABNORMAL LOW (ref 150–400)
RBC: 4.78 MIL/uL (ref 4.22–5.81)
RDW: 13.5 % (ref 11.5–15.5)
WBC: 3.1 10*3/uL — ABNORMAL LOW (ref 4.0–10.5)

## 2017-04-02 LAB — VITAMIN B12: Vitamin B-12: 395 pg/mL (ref 180–914)

## 2017-04-02 LAB — FOLATE: Folate: 38 ng/mL (ref >5.9)

## 2017-04-02 NOTE — Progress Notes (Signed)
[name] Initial Visit:  Patient Care Team: Sharilyn Sites, MD as PCP - General (Family Medicine)  CHIEF COMPLAINTS/PURPOSE OF CONSULTATION:  Thrombocytopenia  HISTORY OF PRESENTING ILLNESS: [name] 66 y.o. male presented today with his wife for evaluation of thrombocytopenia and needs preop clearance for knee surgery.  Most recent CBC from 03/26/2017 demonstrated WBC 3.6K, hemoglobin 14.3 g/dL, hematocrit 41.5%, platelet count 114K.  Previous CBC from 11/04/2016 demonstrated WBC 9.5K, hemoglobin 13.1 g/dL, platelet count 107K.  Lab work in our system dating back to December 2017 demonstrated that his mild thrombocytopenia is chronic and at that time his platelet count was 125K. CT abd/pelvis from 05/22/2016 demonstrated normal spleen size.   Patient denies any chronic alcohol use and only drinks occasionally. He denies any risk factors for chronic viral infections such as HIV, hepatitis. He denies any easy bruising or bleeding including hematuria, hematochezia, hemoptysis. He states that he has a lot of joint pains all over his body. He has to have a total right knee replacement done, which he hopes to get done prior to the end of the year. He has occasional chest pains. He has chronic OSA and uses a CPAP machine at night. He also states he has insomnia and most nights he is only able to get about 3 hours of sleep.  Review of Systems - Oncology ROS as per HPI otherwise 12 point ROS is negative.  MEDICAL HISTORY: Past Medical History:  Diagnosis Date  . Anxiety    patient denies  . Aortic aneurysm (HCC)    ascending  . Arthritis   . Barrett esophagus   . Blood transfusion   . Cochran ([facility]) 2007   melanoma stage 3 stomach bx  . Coronary artery disease    mild by 09/08/13 cath [facility]  . GERD (gastroesophageal reflux disease)    barretts esophagus  . H/O hiatal hernia   . Headache(784.0)   . Hypercholesteremia   . Hypertension   . PONV (postoperative nausea and  vomiting)    trouble breathing after last surgery (at [facility])  . Shingles    10  . Sleep apnea    not tested  . Tumor liver    no problems    SURGICAL HISTORY: Past Surgical History:  Procedure Laterality Date  . ABDOMINAL SURGERY     x3  . back surgeries     x 9  . BACK SURGERY     x8  . BIOPSY N/A 03/28/2013   Procedure: Distal Esophageal Biopsy;  Surgeon: Rogene Houston, MD;  Location: [facility];  Service: Endoscopy;  Laterality: N/A;  . BIOPSY  05/30/2016   Procedure: BIOPSY;  Surgeon: Rogene Houston, MD;  Location: [facility];  Service: Endoscopy;;  . CHEST SURGERY     lung incisional hernia for hernia  . ESOPHAGOGASTRODUODENOSCOPY (EGD) WITH PROPOFOL N/A 03/28/2013   Procedure: ESOPHAGOGASTRODUODENOSCOPY (EGD) WITH PROPOFOL;  Surgeon: Rogene Houston, MD;  Location: [facility];  Service: Endoscopy;  Laterality: N/A;  GE junction @ 38, proximal  margin _0   . ESOPHAGOGASTRODUODENOSCOPY (EGD) WITH PROPOFOL N/A 05/30/2016   Procedure: ESOPHAGOGASTRODUODENOSCOPY (EGD) WITH PROPOFOL;  Surgeon: Rogene Houston, MD;  Location: [facility];  Service: Endoscopy;  Laterality: N/A;  955  . HEMORRHOID SURGERY     x2  . HERNIA REPAIR    . HIATAL HERNIA REPAIR    . JOINT REPLACEMENT     lft knee  . KNEE ARTHROPLASTY     lft  .  KNEE ARTHROSCOPY Right   . LEG SURGERY     x4 crushed   . LITHOTRIPSY    . MELANOMA EXCISION     lft abdomen  . NECK SURGERY  2012  . POSTERIOR CERVICAL FUSION/FORAMINOTOMY  08/18/2011   Procedure: POSTERIOR CERVICAL FUSION/FORAMINOTOMY LEVEL 3;  Surgeon: Ophelia Charter, MD;  Location: [facility];  Service: Neurosurgery;  Laterality: N/A;  Cervical three-four, four-five, five-six posterior cervical  fusion with instrumentation; Right cervical three-four, four-five laminotomy  . REVISION TOTAL HIP ARTHROPLASTY Left   . SHOULDER ARTHROSCOPY     rt bith defect , spur, rotator cuff  . SPINAL CORD STIMULATOR IMPLANT     rt hip  . SPINAL CORD STIMULATOR  INSERTION N/A 11/03/2016   Procedure: LUMBAR SPINAL CORD STIMULATOR REVISION;  Surgeon: Newman Pies, MD;  Location: [facility];  Service: Neurosurgery;  Laterality: N/A;  . THYROID LOBECTOMY     rt    SOCIAL HISTORY: Social History   Social History  . Marital status: Married    Spouse name: N/A  . Number of children: N/A  . Years of education: N/A   Occupational History  . retired    Social History Main Topics  . Smoking status: Former Smoker    Packs/day: 4.00    Years: 10.00    Types: Cigarettes    Quit date: 08/12/1980  . Smokeless tobacco: Never Used     Comment: occ wine  . Alcohol use 0.0 oz/week     Comment: rarely  . Drug use: No  . Sexual activity: Yes    Birth control/ protection: None   Other Topics Concern  . Not on file   Social History Narrative  . No narrative on file    FAMILY HISTORY Family History  Problem Relation Age of Onset  . Heart disease Father   . Heart disease Paternal Grandmother   . Heart disease Paternal Grandfather   . Heart disease Maternal Grandfather   . Heart disease Brother        heart transplant  . Aneurysm Unknown        pt states several members have aortic aneurysm  . Skin Cochran Unknown        several members  . Lymphoma Brother     ALLERGIES:  is allergic to demerol.  MEDICATIONS:  Current Outpatient Prescriptions  Medication Sig Dispense Refill  . calcium-vitamin D (OSCAL WITH D) 500-200 MG-UNIT per tablet Take 1 tablet by mouth 2 (two) times daily.     . cyclobenzaprine (FLEXERIL) 10 MG tablet Take 1 tablet (10 mg total) by mouth 3 (three) times daily as needed for muscle spasms. 50 tablet 1  . dexlansoprazole (DEXILANT) 60 MG capsule Take 60 mg by mouth daily.    . fexofenadine (ALLEGRA) 180 MG tablet Take 180 mg by mouth every evening.    . fish oil-omega-3 fatty acids 1000 MG capsule Take 1 g by mouth 2 (two) times daily.    Marland Kitchen gabapentin (NEURONTIN) 600 MG tablet Take 600 mg by mouth 3 (three) times daily.     . hydrOXYzine (ATARAX/VISTARIL) 25 MG tablet Take 1 tablet (25 mg total) by mouth every 6 (six) hours as needed for nausea or vomiting. 30 tablet 0  . ibuprofen (ADVIL,MOTRIN) 800 MG tablet Take 0.5 tablets (400 mg total) by mouth every 8 (eight) hours as needed. 30 tablet   . lisinopril (PRINIVIL,ZESTRIL) 10 MG tablet Take 10 mg by mouth daily.    . montelukast (SINGULAIR)  10 MG tablet Take 10 mg by mouth daily.     . Multiple Vitamin (MULITIVITAMIN WITH MINERALS) TABS Take 1 tablet by mouth daily.    . Nutritional Supplements (ANTI-INFLAMMATORY ENZYME) CAPS APPLY 1 2 GMS TO AFFECTED AREA 3 4 TIMES DAILY AS NEEDED FOR PAIN  THIS IS A COMPOUNDED CREAM 1 PUMP EQUALS 1 GRAM  2  . ondansetron (ZOFRAN ODT) 4 MG disintegrating tablet 55m ODT q4 hours prn nausea/vomit (Patient taking differently: Take 4 mg by mouth every 4 (four) hours as needed for nausea or vomiting. ) 12 tablet 0  . PRESCRIPTION MEDICATION Domperidone 10 mg - Patient is taking BID.    .Marland Kitchenrosuvastatin (CRESTOR) 10 MG tablet Take 10 mg by mouth every evening.     . traMADol (ULTRAM) 50 MG tablet Take 2 tablets (100 mg total) by mouth every 6 (six) hours as needed for moderate pain. 50 tablet 1   No current facility-administered medications for this visit.     PHYSICAL EXAMINATION:    Vitals:   04/02/17 1304  BP: (!) 143/96  Pulse: 87  Resp: 18  Temp: 97.7 F (36.5 C)  SpO2: 97%    Filed Weights   04/02/17 1304  Weight: 246 lb (111.6 kg)     Physical Exam Constitutional: Well-developed, well-nourished, and in no distress.   HENT:  Head: Normocephalic and atraumatic.  Mouth/Throat: No oropharyngeal exudate. Mucosa moist. Eyes: Pupils are equal, round, and reactive to light. Conjunctivae are normal. No scleral icterus.  Neck: Normal range of motion. Neck supple. No JVD present.  Cardiovascular: Normal rate, regular rhythm and normal heart sounds.  Exam reveals no gallop and no friction rub.   No murmur  heard. Pulmonary/Chest: Effort normal and breath sounds normal. No respiratory distress. No wheezes.No rales.  Abdominal: Soft. Bowel sounds are normal. No distension. There is no tenderness. There is no guarding.  Musculoskeletal: No edema or tenderness.  Lymphadenopathy:    No cervical or supraclavicular adenopathy.  Neurological: Alert and oriented to person, place, and time. No cranial nerve deficit.  Skin: Skin is warm and dry. No rash noted. No erythema. No pallor.  Psychiatric: Affect and judgment normal.    LABORATORY DATA: I have personally reviewed the data as listed:  No visits with results within 1 Month(s) from this visit.  Latest known visit with results is:  Admission on 11/03/2016, Discharged on 11/05/2016  Component Date Value Ref Range Status  . ABO/RH(D) 11/03/2016 A POS   Final  . Antibody Screen 11/03/2016 POS   Final  . Sample Expiration 11/03/2016 11/06/2016   Final  . WBC 11/04/2016 9.5  4.0 - 10.5 K/uL Final  . RBC 11/04/2016 4.32  4.22 - 5.81 MIL/uL Final  . Hemoglobin 11/04/2016 13.1  13.0 - 17.0 g/dL Final  . HCT 11/04/2016 39.9  39.0 - 52.0 % Final  . MCV 11/04/2016 92.4  78.0 - 100.0 fL Final  . MCH 11/04/2016 30.3  26.0 - 34.0 pg Final  . MCHC 11/04/2016 32.8  30.0 - 36.0 g/dL Final  . RDW 11/04/2016 12.7  11.5 - 15.5 % Final  . Platelets 11/04/2016 107* 150 - 400 K/uL Final   Comment: REPEATED TO VERIFY SPECIMEN CHECKED FOR CLOTS PLATELET COUNT CONFIRMED BY SMEAR   . Sodium 11/04/2016 136  135 - 145 mmol/L Final  . Potassium 11/04/2016 4.7  3.5 - 5.1 mmol/L Final  . Chloride 11/04/2016 99* 101 - 111 mmol/L Final  . CO2 11/04/2016 26  22 -  32 mmol/L Final  . Glucose, Bld 11/04/2016 130* 65 - 99 mg/dL Final  . BUN 11/04/2016 22* 6 - 20 mg/dL Final  . Creatinine, Ser 11/04/2016 1.12  0.61 - 1.24 mg/dL Final  . Calcium 11/04/2016 8.7* 8.9 - 10.3 mg/dL Final  . GFR calc non Af Amer 11/04/2016 >60  >60 mL/min Final  . GFR calc Af Amer 11/04/2016  >60  >60 mL/min Final   Comment: (NOTE) The eGFR has been calculated using the CKD EPI equation. This calculation has not been validated in all clinical situations. eGFR's persistently <60 mL/min signify possible Chronic Kidney Disease.   . Anion gap 11/04/2016 11  5 - 15 Final    RADIOGRAPHIC STUDIES: I have personally reviewed the radiological images as listed and agree with the findings in the report  No results found.  ASSESSMENT: Chronic mild thrombocytopenia  PLAN: -I have reviewed with that patient that patient has had chronic thrombocytopenia for at least the past year. His platelets have ranged between 105k-125k. It has never gone down below 100k. -I have discussed with him the different causes of thrombocytopenia related to decreased production (ie. MDS, chronic inflammation from SLE, nutritional deficiencies related to copper, folate, B12, viral suppression from hepatitis/HIV) vs. Increased peripheral destruction (ie. splenic sequestration, ITP). These are things that I will rule out with labs as stated below. I will call patient with the lab results. -Since his thrombocytopenia is mild at this time, I do not believe a bone marrow biopsy is warranted at this time. -He is cleared from heme/onc standpoint to proceed with total right knee replacement with Dr. Leona Carry since his platelets are >100k.  -RTC in 4 months for continued observation with repeat CBC, CMP.  Orders Placed This Encounter  Procedures  . Copper, serum    Standing Status:   Future    Number of Occurrences:   1    Standing Expiration Date:   04/02/2018  . CBC with Differential    Standing Status:   Future    Number of Occurrences:   1    Standing Expiration Date:   04/02/2018  . Comprehensive metabolic panel    Standing Status:   Future    Number of Occurrences:   1    Standing Expiration Date:   04/02/2018  . Vitamin B12    Standing Status:   Future    Number of Occurrences:   1    Standing  Expiration Date:   04/02/2018  . Folate    Standing Status:   Future    Number of Occurrences:   1    Standing Expiration Date:   04/02/2018  . ANA, IFA (with reflex)  . Hepatitis panel, acute    Standing Status:   Future    Number of Occurrences:   1    Standing Expiration Date:   04/02/2018  . HIV antibody (with reflex)    All questions were answered. The patient knows to call the clinic with any problems, questions or concerns.  This note was electronically signed.    Twana First, MD  04/02/2017 1:05 PM

## 2017-04-03 LAB — HEPATITIS PANEL, ACUTE
HCV Ab: <0.1 s/co ratio (ref 0.0–0.9)
Hep A IgM: NEGATIVE
Hep B C IgM: NEGATIVE
Hepatitis B Surface Ag: NEGATIVE

## 2017-04-03 LAB — ANTINUCLEAR ANTIBODIES, IFA: ANA Ab, IFA: NEGATIVE

## 2017-04-03 LAB — HIV ANTIBODY (ROUTINE TESTING W REFLEX): HIV Screen 4th Generation wRfx: NONREACTIVE

## 2017-04-04 LAB — COPPER, SERUM: Copper: 91 ug/dL (ref 72–166)

## 2017-04-09 ENCOUNTER — Other Ambulatory Visit: Payer: Self-pay | Admitting: Orthopedic Surgery

## 2017-04-17 ENCOUNTER — Telehealth (HOSPITAL_COMMUNITY): Payer: Self-pay | Admitting: *Deleted

## 2017-04-17 NOTE — Telephone Encounter (Signed)
-----   Message from Holley Bouche, NP sent at 04/17/2017  2:48 PM EST ----- Ask Dr. Talbert Cage to review them please. I need to clean out my inbasket.   gwd  ----- Message ----- From: Donetta Potts, RN Sent: 04/17/2017   1:26 PM To: Holley Bouche, NP  Patient called wanting his lab results from 11/1.  Dr. Talbert Cage reviewed them but hasn't sent any communication that I can see about them.  What do you want me to tell the patient.

## 2017-04-17 NOTE — Telephone Encounter (Signed)
Reviewed results with Dr. Talbert Cage, patient's work up for thrombocytopenia is negative.  She will still see patient back in 4 months with lab work a few days before that visit and will if his numbers continue to trend downwards then further testing will be done at that time.  At this point, patient's workup is negative.  I called patient back and gave him lab results. Patient verbalizes understanding at this time.

## 2017-04-20 ENCOUNTER — Ambulatory Visit (HOSPITAL_COMMUNITY): Payer: Medicare Other

## 2017-04-25 DIAGNOSIS — J019 Acute sinusitis, unspecified: Secondary | ICD-10-CM | POA: Diagnosis not present

## 2017-05-11 ENCOUNTER — Other Ambulatory Visit: Payer: Self-pay

## 2017-05-11 ENCOUNTER — Encounter (HOSPITAL_COMMUNITY): Payer: Self-pay

## 2017-05-11 ENCOUNTER — Encounter (HOSPITAL_COMMUNITY)
Admission: RE | Admit: 2017-05-11 | Discharge: 2017-05-11 | Disposition: A | Discharge status: Home / Self Care | Payer: Medicare Other | Source: Ambulatory Visit | Attending: Orthopedic Surgery | Admitting: Orthopedic Surgery

## 2017-05-11 DIAGNOSIS — M1711 Unilateral primary osteoarthritis, right knee: Secondary | ICD-10-CM | POA: Diagnosis not present

## 2017-05-11 DIAGNOSIS — Z01818 Encounter for other preprocedural examination: Secondary | ICD-10-CM | POA: Diagnosis not present

## 2017-05-11 LAB — COMPREHENSIVE METABOLIC PANEL
ALT: 21 U/L (ref 17–63)
AST: 27 U/L (ref 15–41)
Albumin: 3.8 g/dL (ref 3.5–5.0)
Alkaline Phosphatase: 56 U/L (ref 38–126)
Anion gap: 7 (ref 5–15)
BUN: 24 mg/dL — ABNORMAL HIGH (ref 6–20)
CO2: 24 mmol/L (ref 22–32)
Calcium: 8.9 mg/dL (ref 8.9–10.3)
Chloride: 106 mmol/L (ref 101–111)
Creatinine, Ser: 1.13 mg/dL (ref 0.61–1.24)
GFR calc Af Amer: >60 mL/min (ref >60)
GFR calc non Af Amer: >60 mL/min (ref >60)
Glucose, Bld: 95 mg/dL (ref 65–99)
Potassium: 4.2 mmol/L (ref 3.5–5.1)
Sodium: 137 mmol/L (ref 135–145)
Total Bilirubin: 0.8 mg/dL (ref 0.3–1.2)
Total Protein: 7 g/dL (ref 6.5–8.1)

## 2017-05-11 LAB — CBC WITH DIFFERENTIAL/PLATELET
Basophils Absolute: 0 10*3/uL (ref 0.0–0.1)
Basophils Relative: 0 %
Eosinophils Absolute: 0.1 10*3/uL (ref 0.0–0.7)
Eosinophils Relative: 1 %
HCT: 41.7 % (ref 39.0–52.0)
Hemoglobin: 13.7 g/dL (ref 13.0–17.0)
Lymphocytes Relative: 35 %
Lymphs Abs: 1.5 10*3/uL (ref 0.7–4.0)
MCH: 30.6 pg (ref 26.0–34.0)
MCHC: 32.9 g/dL (ref 30.0–36.0)
MCV: 93.1 fL (ref 78.0–100.0)
Monocytes Absolute: 0.3 10*3/uL (ref 0.1–1.0)
Monocytes Relative: 7 %
Neutro Abs: 2.4 10*3/uL (ref 1.7–7.7)
Neutrophils Relative %: 57 %
Platelets: 130 10*3/uL — ABNORMAL LOW (ref 150–400)
RBC: 4.48 MIL/uL (ref 4.22–5.81)
RDW: 13.7 % (ref 11.5–15.5)
WBC: 4.2 10*3/uL (ref 4.0–10.5)

## 2017-05-11 LAB — SURGICAL PCR SCREEN
MRSA, PCR: NEGATIVE
Staphylococcus aureus: NEGATIVE

## 2017-05-11 NOTE — Pre-Procedure Instructions (Signed)
[facility]  05/11/2017      [id number] - [place], [place] [address] [place] [address] Phone: [phone] Fax: [phone]  [place], [place] - [place] [id number] [place] [address] [place] [address] Phone: [phone] Fax: [phone]    Your procedure is scheduled on May 18, 2017.             Report to [facility] Admitting at 0900 AM.  Call this number if you have problems the morning of surgery:  [phone]   Remember:  Do not eat food or drink liquids after midnight.  Take these medicines the morning of surgery with A SIP OF WATER dexlansoprazole (dexilant), diazepam (valium), fluticasone (flonase), hydroxyzine (atarax)-if needed for nausea, montelukast (singulair), oxycodone-acetaminophen (percocet)-if needed for pain.  7 days prior to surgery STOP taking any Aspirin (unless otherwise instructed by your surgeon), Aleve, Naproxen, Ibuprofen, Motrin, Advil, Goody's, BC's, all herbal medications, fish oil, and all vitamins  Continue all other medications as instructed by your physician except follow the above medication instructions before surgery   Do not wear jewelry, make-up or nail polish.  Do not wear lotions, powders, or perfumes, or deodorant.  Men may shave face and neck.  Do not bring valuables to the hospital.  [facility] is not responsible for any belongings or valuables.  Contacts, dentures or bridgework may not be worn into surgery.  Leave your suitcase in the car.  After surgery it may be brought to your room.  For patients admitted to the hospital, discharge time will be determined by your treatment team.  Patients discharged the day of surgery will not be allowed to drive home.   Special instructions:   [place]- Preparing For Surgery  Before surgery, you can play an important role. Because skin is not sterile, your skin needs to be as free of germs as possible. You can reduce the  number of germs on your skin by washing with CHG (chlorahexidine gluconate) Soap before surgery.  CHG is an antiseptic cleaner which kills germs and bonds with the skin to continue killing germs even after washing.  Please do not use if you have an allergy to CHG or antibacterial soaps. If your skin becomes reddened/irritated stop using the CHG.  Do not shave (including legs and underarms) for at least 48 hours prior to first CHG shower. It is OK to shave your face.  Please follow these instructions carefully.   1. Shower the NIGHT BEFORE SURGERY and the MORNING OF SURGERY with CHG.   2. If you chose to wash your hair, wash your hair first as usual with your normal shampoo.  3. After you shampoo, rinse your hair and body thoroughly to remove the shampoo.  4. Use CHG as you would any other liquid soap. You can apply CHG directly to the skin and wash gently with a scrungie or a clean washcloth.   5. Apply the CHG Soap to your body ONLY FROM THE NECK DOWN.  Do not use on open wounds or open sores. Avoid contact with your eyes, ears, mouth and genitals (private parts). Wash Face and genitals (private parts)  with your normal soap.  6. Wash thoroughly, paying special attention to the area where your surgery will be performed.  7. Thoroughly rinse your body with warm water from the neck down.  8. DO NOT shower/wash with your normal soap after using and rinsing off the CHG Soap.  9. Pat yourself dry with a CLEAN TOWEL.  10. Wear CLEAN PAJAMAS to bed the night before surgery, wear comfortable clothes the morning of surgery  11. Place CLEAN SHEETS on your bed the night of your first shower and DO NOT SLEEP WITH PETS.    Day of Surgery: Do not apply any deodorants/lotions. Please wear clean clothes to the hospital/surgery center.    Please read over the following fact sheets that you were given. Pain Booklet, Coughing and Deep Breathing, MRSA Information and Surgical Site Infection  Prevention

## 2017-05-11 NOTE — Progress Notes (Addendum)
JDY:[id number], Ruben Reichmann, MD  Cardiologist: Dr. Prentice Docker  EKG: 05/30/16 in EPIC  Stress test: 07/05/15 results in Care Everywhere  ECHO: pt denies  Cardiac Cath: pt denies  Chest x-ray:02/11/17 in Epic  Patient has an abdominal aortic ascending aneurysm that Dr. Otho Perl is monitoring for now. No surgery recommended at this time.   Patient results from cardiac event monitor in Care Everywhere 03/2016

## 2017-05-12 NOTE — Progress Notes (Signed)
Anesthesia Chart Review:  Pt is a 66 year old male scheduled for R total knee arthroplasty on 05/18/2017 with Vickey Huger, MD  - PCP is Sharilyn Sites, MD who cleared pt for surgery - Hematologist is Twana First, MD who cleared pt for surgery.  - Cardiologist is Abran Richard, MD. 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 Former smoker. BMI 33.  - S/p lumbar spinal cord stimulator insertion 11/03/16. S/p lumbar fusion 03/11/12. S/p cervical fusion 08/18/11.   Anesthesia history:  - pt reports hx of 47 surgeries.  - Reportedly had breathing issues after knee scope 06/18/16, asked to stay, pt refused, told to use CPAP mask at home (anesthesia records on paper chart do not reflect this at all).   - Pt reports he "had to be bagged" in PACU after one of his surgeries here at [facility]. He thought it might have been 08/18/11 cervical fusion.  Note from PACU RN at that time indicates pt was sleepy and had to be reminded to breathe.  No documentation of being re-intubated or "bagged".    Medications include: dexilant, lisinopril, rosuvastatin  Preoperative labs reviewed.    CXR 05/22/16: No acute disease  EKG 05/30/16: Sinus bradycardia (52 bpm). Left axis deviation. Left ventricular hypertrophy with QRS widening  Cardiac event monitor 03/18/16 (care everywhere): 1.Unremarkable event monitor.  2.Arrhythmia as described 3.Symptoms were reported  4.Symptoms were not correlated with arrhythmias.During shortness of breath, patient was noted be in a sinus tachycardia with PACs on occasion.  CTA chest 07/10/15:  1. Stable ascending aortic aneurysm measuring 4.4 cm in maximum diameter currently. Some motion does obscure detail as noted above. 2. No evidence of acute pulmonary embolism. 3. Extensive coronary  artery calcifications.Moderate cardiomegaly. 4. Probable hemangioma in the right lobe of liver unchanged compared to CT abdomen pelvis from 2014.  Nuclear stress test 07/05/15:  - Fixed inferior defect compatible with prior MI or diaphragmatic attenuation. No ischemia suggested. Overall LV function appears normal. EF calculated at 57%.  Cardiac cath 09/08/13 ([facility]): 1. Nonobstructive CAD (proximal LAD 15%, proximal CX 20%, proximal RCA 25%, mid RCA 25%) 2. Heavy calcification of the proximal wall vessels. 3. LV gram not done due to BF. 4. Patient with EF with last RCA injection. Counter-shocked 1 restore NSR.  If no changes, I anticipate pt can proceed with surgery as scheduled.   Willeen Cass, FNP-BC [facility]/Anesthesiology Phone: [phone] 05/12/2017 2:51 PM

## 2017-05-13 DIAGNOSIS — G4733 Obstructive sleep apnea (adult) (pediatric): Secondary | ICD-10-CM | POA: Diagnosis not present

## 2017-05-15 MED ORDER — TRANEXAMIC ACID 1000 MG/10ML IV SOLN
1000.0000 mg | INTRAVENOUS | Status: DC
  Filled 2017-05-15: qty 10

## 2017-05-18 ENCOUNTER — Encounter (HOSPITAL_COMMUNITY): Payer: Self-pay | Admitting: Certified Registered Nurse Anesthetist

## 2017-05-18 ENCOUNTER — Ambulatory Visit (HOSPITAL_COMMUNITY): Payer: Medicare Other | Admitting: Certified Registered Nurse Anesthetist

## 2017-05-18 ENCOUNTER — Other Ambulatory Visit: Payer: Self-pay

## 2017-05-18 ENCOUNTER — Ambulatory Visit (HOSPITAL_COMMUNITY): Payer: Medicare Other | Admitting: Emergency Medicine

## 2017-05-18 ENCOUNTER — Ambulatory Visit (HOSPITAL_COMMUNITY)
Admission: RE | Admit: 2017-05-18 | Discharge: 2017-05-18 | Disposition: A | Discharge status: Home / Self Care | Payer: Medicare Other | Source: Ambulatory Visit | Attending: Orthopedic Surgery | Admitting: Orthopedic Surgery

## 2017-05-18 ENCOUNTER — Encounter (HOSPITAL_COMMUNITY)
Admission: RE | Disposition: A | Discharge status: Home / Self Care | Payer: Self-pay | Source: Ambulatory Visit | Attending: Orthopedic Surgery

## 2017-05-18 DIAGNOSIS — Z85820 Personal history of malignant melanoma of skin: Secondary | ICD-10-CM | POA: Diagnosis not present

## 2017-05-18 DIAGNOSIS — I739 Peripheral vascular disease, unspecified: Secondary | ICD-10-CM | POA: Diagnosis not present

## 2017-05-18 DIAGNOSIS — Z96642 Presence of left artificial hip joint: Secondary | ICD-10-CM | POA: Insufficient documentation

## 2017-05-18 DIAGNOSIS — Z96652 Presence of left artificial knee joint: Secondary | ICD-10-CM | POA: Insufficient documentation

## 2017-05-18 DIAGNOSIS — I2510 Atherosclerotic heart disease of native coronary artery without angina pectoris: Secondary | ICD-10-CM | POA: Insufficient documentation

## 2017-05-18 DIAGNOSIS — Z7951 Long term (current) use of inhaled steroids: Secondary | ICD-10-CM | POA: Insufficient documentation

## 2017-05-18 DIAGNOSIS — Z79899 Other long term (current) drug therapy: Secondary | ICD-10-CM | POA: Insufficient documentation

## 2017-05-18 DIAGNOSIS — R1110 Vomiting, unspecified: Secondary | ICD-10-CM | POA: Diagnosis not present

## 2017-05-18 DIAGNOSIS — E7800 Pure hypercholesterolemia, unspecified: Secondary | ICD-10-CM | POA: Diagnosis not present

## 2017-05-18 DIAGNOSIS — Z87891 Personal history of nicotine dependence: Secondary | ICD-10-CM | POA: Insufficient documentation

## 2017-05-18 DIAGNOSIS — I10 Essential (primary) hypertension: Secondary | ICD-10-CM | POA: Insufficient documentation

## 2017-05-18 DIAGNOSIS — M25561 Pain in right knee: Secondary | ICD-10-CM | POA: Diagnosis present

## 2017-05-18 DIAGNOSIS — M1711 Unilateral primary osteoarthritis, right knee: Secondary | ICD-10-CM | POA: Diagnosis not present

## 2017-05-18 DIAGNOSIS — G4733 Obstructive sleep apnea (adult) (pediatric): Secondary | ICD-10-CM | POA: Insufficient documentation

## 2017-05-18 DIAGNOSIS — Z5309 Procedure and treatment not carried out because of other contraindication: Secondary | ICD-10-CM | POA: Insufficient documentation

## 2017-05-18 SURGERY — ARTHROPLASTY, KNEE, TOTAL
Anesthesia: Spinal

## 2017-05-18 MED ORDER — LACTATED RINGERS IV SOLN
INTRAVENOUS | Status: DC
  Administered 2017-05-18: 09:00:00 via INTRAVENOUS

## 2017-05-18 MED ORDER — ONDANSETRON HCL 4 MG/2ML IJ SOLN
4.0000 mg | Freq: Once | INTRAMUSCULAR | Status: AC
  Administered 2017-05-18: 4 mg via INTRAVENOUS
  Filled 2017-05-18: qty 2

## 2017-05-18 MED ORDER — MIDAZOLAM HCL 2 MG/2ML IJ SOLN
INTRAMUSCULAR | Status: AC
  Filled 2017-05-18: qty 2

## 2017-05-18 MED ORDER — METOCLOPRAMIDE HCL 5 MG/ML IJ SOLN
INTRAMUSCULAR | Status: AC
  Administered 2017-05-18: 10 mg via INTRAVENOUS
  Filled 2017-05-18: qty 2

## 2017-05-18 MED ORDER — METOCLOPRAMIDE HCL 5 MG/ML IJ SOLN
10.0000 mg | Freq: Once | INTRAMUSCULAR | Status: AC
  Administered 2017-05-18: 10 mg via INTRAVENOUS
  Filled 2017-05-18: qty 2

## 2017-05-18 MED ORDER — CHLORHEXIDINE GLUCONATE 4 % EX LIQD: 60.0000 mL | Freq: Once | CUTANEOUS | Status: DC

## 2017-05-18 MED ORDER — ONDANSETRON HCL 4 MG/2ML IJ SOLN
INTRAMUSCULAR | Status: AC
  Administered 2017-05-18: 4 mg via INTRAVENOUS
  Filled 2017-05-18: qty 2

## 2017-05-18 MED ORDER — ACETAMINOPHEN 500 MG PO TABS
1000.0000 mg | ORAL_TABLET | Freq: Once | ORAL | Status: AC
  Administered 2017-05-18: 1000 mg via ORAL

## 2017-05-18 MED ORDER — MIDAZOLAM HCL 2 MG/2ML IJ SOLN
INTRAMUSCULAR | Status: AC
  Administered 2017-05-18: 2 mg via INTRAVENOUS
  Filled 2017-05-18: qty 2

## 2017-05-18 MED ORDER — DEXAMETHASONE SODIUM PHOSPHATE 10 MG/ML IJ SOLN
8.0000 mg | Freq: Once | INTRAMUSCULAR | Status: DC
  Filled 2017-05-18: qty 1

## 2017-05-18 MED ORDER — HYDROMORPHONE HCL 1 MG/ML IJ SOLN
0.2500 mg | INTRAMUSCULAR | Status: DC | PRN
Start: 2017-05-18 — End: 2017-05-18

## 2017-05-18 MED ORDER — FENTANYL CITRATE (PF) 100 MCG/2ML IJ SOLN
INTRAMUSCULAR | Status: AC
  Administered 2017-05-18: 50 ug via INTRAVENOUS
  Filled 2017-05-18: qty 2

## 2017-05-18 MED ORDER — PROMETHAZINE HCL 25 MG/ML IJ SOLN: 6.2500 mg | INTRAMUSCULAR | Status: DC | PRN

## 2017-05-18 MED ORDER — CEFAZOLIN SODIUM-DEXTROSE 2-4 GM/100ML-% IV SOLN
2.0000 g | INTRAVENOUS | Status: DC
  Filled 2017-05-18: qty 100

## 2017-05-18 MED ORDER — MIDAZOLAM HCL 2 MG/2ML IJ SOLN
2.0000 mg | Freq: Once | INTRAMUSCULAR | Status: AC
  Administered 2017-05-18: 2 mg via INTRAVENOUS
  Filled 2017-05-18: qty 2

## 2017-05-18 MED ORDER — GABAPENTIN 300 MG PO CAPS
ORAL_CAPSULE | ORAL | Status: AC
  Filled 2017-05-18: qty 1

## 2017-05-18 MED ORDER — METOCLOPRAMIDE HCL 5 MG/ML IJ SOLN
5.0000 mg | Freq: Once | INTRAMUSCULAR | Status: AC
  Administered 2017-05-18: 5 mg via INTRAVENOUS

## 2017-05-18 MED ORDER — DEXAMETHASONE SODIUM PHOSPHATE 10 MG/ML IJ SOLN
10.0000 mg | Freq: Once | INTRAMUSCULAR | Status: AC
  Administered 2017-05-18: 10 mg via INTRAVENOUS

## 2017-05-18 MED ORDER — LACTATED RINGERS IV SOLN
INTRAVENOUS | Status: DC
  Administered 2017-05-18 (×2): via INTRAVENOUS

## 2017-05-18 MED ORDER — ONDANSETRON HCL 4 MG/2ML IJ SOLN
4.0000 mg | Freq: Once | INTRAMUSCULAR | Status: AC
  Administered 2017-05-18: 4 mg via INTRAVENOUS

## 2017-05-18 MED ORDER — ACETAMINOPHEN 500 MG PO TABS
ORAL_TABLET | ORAL | Status: AC
  Administered 2017-05-18: 1000 mg via ORAL
  Filled 2017-05-18: qty 2

## 2017-05-18 MED ORDER — METOCLOPRAMIDE HCL 5 MG/ML IJ SOLN
INTRAMUSCULAR | Status: AC
  Filled 2017-05-18: qty 2

## 2017-05-18 MED ORDER — FENTANYL CITRATE (PF) 250 MCG/5ML IJ SOLN
INTRAMUSCULAR | Status: AC
  Filled 2017-05-18: qty 5

## 2017-05-18 MED ORDER — GABAPENTIN 300 MG PO CAPS
300.0000 mg | ORAL_CAPSULE | Freq: Once | ORAL | Status: AC
  Administered 2017-05-18: 300 mg via ORAL

## 2017-05-18 MED ORDER — PROPOFOL 10 MG/ML IV BOLUS
INTRAVENOUS | Status: AC
  Filled 2017-05-18: qty 40

## 2017-05-18 MED ORDER — FENTANYL CITRATE (PF) 100 MCG/2ML IJ SOLN
50.0000 ug | Freq: Once | INTRAMUSCULAR | Status: AC
  Administered 2017-05-18: 50 ug via INTRAVENOUS
  Filled 2017-05-18: qty 1

## 2017-05-18 SURGICAL SUPPLY — 57 items
BANDAGE ACE 6X5 VEL STRL LF (GAUZE/BANDAGES/DRESSINGS) ×3 IMPLANT
BANDAGE ESMARK 6X9 LF (GAUZE/BANDAGES/DRESSINGS) ×2 IMPLANT
BLADE SAGITTAL 13X1.27X60 (BLADE) ×3 IMPLANT
BLADE SAW SGTL 83.5X18.5 (BLADE) ×3 IMPLANT
BLADE SURG 10 STRL SS (BLADE) ×3 IMPLANT
BNDG CMPR 9X6 STRL LF SNTH (GAUZE/BANDAGES/DRESSINGS) ×1
BNDG ESMARK 6X9 LF (GAUZE/BANDAGES/DRESSINGS) ×2
BOWL SMART MIX CTS (DISPOSABLE) ×3 IMPLANT
COVER SURGICAL LIGHT HANDLE (MISCELLANEOUS) ×3 IMPLANT
CUFF TOURNIQUET SINGLE 34IN LL (TOURNIQUET CUFF) ×3 IMPLANT
DRAPE EXTREMITY T 121X128X90 (DRAPE) ×3 IMPLANT
DRAPE HALF SHEET 40X57 (DRAPES) ×3 IMPLANT
DRAPE INCISE IOBAN 66X45 STRL (DRAPES) ×6 IMPLANT
DRAPE U-SHAPE 47X51 STRL (DRAPES) ×3 IMPLANT
DRSG AQUACEL AG ADV 3.5X10 (GAUZE/BANDAGES/DRESSINGS) ×3 IMPLANT
DURAPREP 26ML APPLICATOR (WOUND CARE) ×6 IMPLANT
ELECT REM PT RETURN 9FT ADLT (ELECTROSURGICAL) ×2
ELECTRODE REM PT RTRN 9FT ADLT (ELECTROSURGICAL) ×2 IMPLANT
FILTER STRAW FLUID ASPIR (MISCELLANEOUS) IMPLANT
GLOVE BIOGEL M 7.0 STRL (GLOVE) IMPLANT
GLOVE BIOGEL PI IND STRL 7.5 (GLOVE) IMPLANT
GLOVE BIOGEL PI IND STRL 8.5 (GLOVE) ×2 IMPLANT
GLOVE BIOGEL PI INDICATOR 7.5 (GLOVE)
GLOVE BIOGEL PI INDICATOR 8.5 (GLOVE) ×1
GLOVE SURG ORTHO 8.0 STRL STRW (GLOVE) ×6 IMPLANT
GOWN STRL REUS W/ TWL LRG LVL3 (GOWN DISPOSABLE) ×2 IMPLANT
GOWN STRL REUS W/ TWL XL LVL3 (GOWN DISPOSABLE) ×4 IMPLANT
GOWN STRL REUS W/TWL 2XL LVL3 (GOWN DISPOSABLE) ×3 IMPLANT
GOWN STRL REUS W/TWL LRG LVL3 (GOWN DISPOSABLE) ×2
GOWN STRL REUS W/TWL XL LVL3 (GOWN DISPOSABLE) ×4
HANDPIECE INTERPULSE COAX TIP (DISPOSABLE) ×2
HOOD PEEL AWAY FACE SHEILD DIS (HOOD) ×9 IMPLANT
KIT BASIN OR (CUSTOM PROCEDURE TRAY) ×3 IMPLANT
KIT ROOM TURNOVER OR (KITS) ×3 IMPLANT
MANIFOLD NEPTUNE II (INSTRUMENTS) ×3 IMPLANT
NDL 18GX1X1/2 (RX/OR ONLY) (NEEDLE) IMPLANT
NEEDLE 18GX1X1/2 (RX/OR ONLY) (NEEDLE) IMPLANT
NEEDLE 22X1 1/2 (OR ONLY) (NEEDLE) ×6 IMPLANT
NS IRRIG 1000ML POUR BTL (IV SOLUTION) ×3 IMPLANT
PACK TOTAL JOINT (CUSTOM PROCEDURE TRAY) ×3 IMPLANT
PAD ARMBOARD 7.5X6 YLW CONV (MISCELLANEOUS) ×6 IMPLANT
SET HNDPC FAN SPRY TIP SCT (DISPOSABLE) ×2 IMPLANT
STRIP CLOSURE SKIN 1/2X4 (GAUZE/BANDAGES/DRESSINGS) ×3 IMPLANT
SUCTION FRAZIER HANDLE 10FR (MISCELLANEOUS)
SUCTION TUBE FRAZIER 10FR DISP (MISCELLANEOUS) IMPLANT
SUT MNCRL AB 3-0 PS2 18 (SUTURE) ×3 IMPLANT
SUT VIC AB 0 CTB1 27 (SUTURE) ×6 IMPLANT
SUT VIC AB 1 CT1 27 (SUTURE) ×4
SUT VIC AB 1 CT1 27XBRD ANBCTR (SUTURE) ×4 IMPLANT
SUT VIC AB 2-0 CT1 27 (SUTURE) ×4
SUT VIC AB 2-0 CT1 TAPERPNT 27 (SUTURE) ×4 IMPLANT
SYR 20CC LL (SYRINGE) ×6 IMPLANT
SYR TB 1ML LUER SLIP (SYRINGE) IMPLANT
TOWEL OR 17X24 6PK STRL BLUE (TOWEL DISPOSABLE) ×3 IMPLANT
TOWEL OR 17X26 10 PK STRL BLUE (TOWEL DISPOSABLE) ×3 IMPLANT
TRAY CATH 16FR W/PLASTIC CATH (SET/KITS/TRAYS/PACK) IMPLANT
WRAP KNEE MAXI GEL POST OP (GAUZE/BANDAGES/DRESSINGS) ×3 IMPLANT

## 2017-05-18 NOTE — H&P (Signed)
[name] MRN:  [id number] DOB/SEX:  15-Mar-1951/male  CHIEF COMPLAINT:  Painful right Knee  HISTORY: Patient is a 66 y.o. male presented with a history of pain in the right knee. Onset of symptoms was gradual starting a few years ago with gradually worsening course since that time. Patient has been treated conservatively with over-the-counter NSAIDs and activity modification. Patient currently rates pain in the knee at 10 out of 10 with activity. There is pain at night.  PAST MEDICAL HISTORY: Patient Active Problem List   Diagnosis Date Noted  . Thrombocytopenia ([place]) 04/02/2017  . Lumbar stenosis with neurogenic claudication 11/03/2016  . Intractable vomiting with nausea 05/29/2016  . OSA (obstructive sleep apnea) 11/18/2013  . Liver lesion 03/01/2013  . Barrett's esophagus 03/01/2013  . GERD (gastroesophageal reflux disease) 03/01/2013  . Essential hypertension, benign 03/01/2013  . S/P lumbar fusion 03/25/2012  . Back pain without radiation 03/25/2012   Past Medical History:  Diagnosis Date  . Anxiety    patient denies  . Aortic aneurysm ([facility])    ascending  . Arthritis   . Barrett esophagus   . Blood transfusion   . Cancer ([place]) 2007   melanoma stage 3 stomach bx  . Coronary artery disease    mild by 09/08/13 cath [facility]  . GERD (gastroesophageal reflux disease)    barretts esophagus  . H/O hiatal hernia   . Headache(784.0)   . Hypercholesteremia   . Hypertension   . PONV (postoperative nausea and vomiting)    trouble breathing after last surgery (at [facility])  . Shingles    10  . Sleep apnea    not tested  . Tumor liver    no problems   Past Surgical History:  Procedure Laterality Date  . ABDOMINAL SURGERY     x3  . back surgeries     x 9  . BACK SURGERY     x8  . BIOPSY N/A 03/28/2013   Procedure: Distal Esophageal Biopsy;  Surgeon: Rogene Houston, MD;  Location: [facility];  Service: Endoscopy;  Laterality: N/A;  . BIOPSY  05/30/2016   Procedure: BIOPSY;   Surgeon: Rogene Houston, MD;  [facility];  Service: Endoscopy;;  . CHEST SURGERY     lung incisional hernia for hernia  . ESOPHAGOGASTRODUODENOSCOPY (EGD) WITH PROPOFOL N/A 03/28/2013   Procedure: ESOPHAGOGASTRODUODENOSCOPY (EGD) WITH PROPOFOL;  Surgeon: Rogene Houston, MD;  Location: [facility];  Service: Endoscopy;  Laterality: N/A;  GE junction @ 38, proximal  margin @37   . ESOPHAGOGASTRODUODENOSCOPY (EGD) WITH PROPOFOL N/A 05/30/2016   Procedure: ESOPHAGOGASTRODUODENOSCOPY (EGD) WITH PROPOFOL;  Surgeon: Rogene Houston, MD;  [facility]: [facility];  Service: Endoscopy;  Laterality: N/A;  955  . HEMORRHOID SURGERY     x2  . HERNIA REPAIR    . HIATAL HERNIA REPAIR    . JOINT REPLACEMENT     lft knee  . KNEE ARTHROPLASTY     lft  . KNEE ARTHROSCOPY Right   . LEG SURGERY     x4 crushed   . LITHOTRIPSY    . MELANOMA EXCISION     lft abdomen  . NECK SURGERY  2012  . POSTERIOR CERVICAL FUSION/FORAMINOTOMY  08/18/2011   Procedure: POSTERIOR CERVICAL FUSION/FORAMINOTOMY LEVEL 3;  Surgeon: Ophelia Charter, MD;  Location: [facility];  Service: Neurosurgery;  Laterality: N/A;  Cervical three-four, four-five, five-six posterior cervical  fusion with instrumentation; Right cervical three-four, four-five laminotomy  . REVISION TOTAL HIP ARTHROPLASTY  Left   . SHOULDER ARTHROSCOPY     rt bith defect , spur, rotator cuff  . SPINAL CORD STIMULATOR IMPLANT     rt hip  . SPINAL CORD STIMULATOR INSERTION N/A 11/03/2016   Procedure: LUMBAR SPINAL CORD STIMULATOR REVISION;  Surgeon: Newman Pies, MD;  Location: [facility];  Service: Neurosurgery;  Laterality: N/A;  . THYROID LOBECTOMY     rt     MEDICATIONS:   Medications Prior to Admission  Medication Sig Dispense Refill Last Dose  . Bioflavonoid Products (ESTER C PO) Take 1 tablet by mouth daily.   05/17/2017 at Unknown time  . calcium-vitamin D (OSCAL WITH D) 500-200 MG-UNIT per tablet Take 1 tablet by mouth 2 (two) times daily.     05/17/2017 at Unknown time  . dexlansoprazole (DEXILANT) 60 MG capsule Take 60 mg by mouth daily.   05/17/2017 at Unknown time  . diazepam (VALIUM) 10 MG tablet Take 10 mg by mouth 3 (three) times daily as needed (for back pain).   Past Week at Unknown time  . fexofenadine (ALLEGRA) 180 MG tablet Take 180 mg by mouth every evening.   05/17/2017 at Unknown time  . fish oil-omega-3 fatty acids 1000 MG capsule Take 1 g by mouth 2 (two) times daily.   05/17/2017 at Unknown time  . fluticasone (FLONASE) 50 MCG/ACT nasal spray Place 2 sprays into both nostrils daily.   Past Week at Unknown time  . gabapentin (NEURONTIN) 600 MG tablet Take 600 mg by mouth 3 (three) times daily.   05/17/2017 at Unknown time  . hydrOXYzine (ATARAX/VISTARIL) 25 MG tablet Take 1 tablet (25 mg total) by mouth every 6 (six) hours as needed for nausea or vomiting. 30 tablet 0 Past Week at Unknown time  . lisinopril (PRINIVIL,ZESTRIL) 10 MG tablet Take 10 mg by mouth daily.   05/17/2017 at Unknown time  . montelukast (SINGULAIR) 10 MG tablet Take 10 mg by mouth daily.    05/17/2017 at Unknown time  . Multiple Vitamin (MULITIVITAMIN WITH MINERALS) TABS Take 1 tablet by mouth daily.   05/17/2017 at Unknown time  . ondansetron (ZOFRAN ODT) 4 MG disintegrating tablet 4mg  ODT q4 hours prn nausea/vomit (Patient taking differently: Take 4 mg by mouth every 4 (four) hours as needed for nausea or vomiting. ) 12 tablet 0 Past Week at Unknown time  . PRESCRIPTION MEDICATION Take 10 mg by mouth 2 (two) times daily. Domperidone 10 mg from [place]   05/17/2017 at Unknown time  . rosuvastatin (CRESTOR) 10 MG tablet Take 10 mg by mouth every evening.    05/17/2017 at Unknown time  . cyclobenzaprine (FLEXERIL) 10 MG tablet Take 1 tablet (10 mg total) by mouth 3 (three) times daily as needed for muscle spasms. (Patient not taking: Reported on 05/05/2017) 50 tablet 1 Completed Course at Unknown time  . oxyCODONE-acetaminophen (PERCOCET) 10-325 MG  tablet Take 1 tablet by mouth every 6 (six) hours as needed for pain.   More than a month at Unknown time  . traMADol (ULTRAM) 50 MG tablet Take 2 tablets (100 mg total) by mouth every 6 (six) hours as needed for moderate pain. (Patient not taking: Reported on 05/05/2017) 50 tablet 1 Completed Course at Unknown time    ALLERGIES:   Allergies  Allergen Reactions  . Demerol Other (See Comments)    "Makes me crazy."    REVIEW OF SYSTEMS:  A comprehensive review of systems was negative except for: Musculoskeletal: positive for arthralgias and bone pain  FAMILY HISTORY:   Family History  Problem Relation Age of Onset  . Heart disease Father   . Heart disease Paternal Grandmother   . Heart disease Paternal Grandfather   . Heart disease Maternal Grandfather   . Heart disease Brother        heart transplant  . Aneurysm Unknown        pt states several members have aortic aneurysm  . Skin cancer Unknown        several members  . Lymphoma Brother     SOCIAL HISTORY:   Social History   Tobacco Use  . Smoking status: Former Smoker    Packs/day: 4.00    Years: 10.00    Pack years: 40.00    Types: Cigarettes    Last attempt to quit: 08/12/1980    Years since quitting: 36.7  . Smokeless tobacco: Never Used  . Tobacco comment: occ wine  Substance Use Topics  . Alcohol use: Yes    Alcohol/week: 0.0 oz    Comment: rarely     EXAMINATION:  Vital signs in last 24 hours: Temp:  [98.3 F (36.8 C)] 98.3 F (36.8 C) (12/17 0854) Pulse Rate:  [65] 65 (12/17 0854) Resp:  [18] 18 (12/17 0854) BP: (162)/(95) 162/95 (12/17 0856) SpO2:  [99 %] 99 % (12/17 0854) Weight:  [111.1 kg (245 lb)] 111.1 kg (245 lb) (12/17 0854)  BP (!) 162/95   Pulse 65   Temp 98.3 F (36.8 C) (Oral)   Resp 18   Wt 111.1 kg (245 lb)   SpO2 99%   BMI 33.23 kg/m   General Appearance:    Alert, cooperative, no distress, appears stated age  Head:    Normocephalic, without obvious abnormality, atraumatic   Eyes:    PERRL, conjunctiva/corneas clear, EOM's intact, fundi    benign, both eyes       Ears:    Normal TM's and external ear canals, both ears  Nose:   Nares normal, septum midline, mucosa normal, no drainage    or sinus tenderness  Throat:   Lips, mucosa, and tongue normal; teeth and gums normal  Neck:   Supple, symmetrical, trachea midline, no adenopathy;       thyroid:  No enlargement/tenderness/nodules; no carotid   bruit or JVD  Back:     Symmetric, no curvature, ROM normal, no CVA tenderness  Lungs:     Clear to auscultation bilaterally, respirations unlabored  Chest wall:    No tenderness or deformity  Heart:    Regular rate and rhythm, S1 and S2 normal, no murmur, rub   or gallop  Abdomen:     Soft, non-tender, bowel sounds active all four quadrants,    no masses, no organomegaly  Genitalia:    Normal male without lesion, discharge or tenderness  Rectal:    Normal tone, normal prostate, no masses or tenderness;   guaiac negative stool  Extremities:   Extremities normal, atraumatic, no cyanosis or edema  Pulses:   2+ and symmetric all extremities  Skin:   Skin color, texture, turgor normal, no rashes or lesions  Lymph nodes:   Cervical, supraclavicular, and axillary nodes normal  Neurologic:   CNII-XII intact. Normal strength, sensation and reflexes      throughout    Musculoskeletal:  ROM 0-120, Ligaments intact,  Imaging Review Plain radiographs demonstrate severe degenerative joint disease of the right knee. The overall alignment is neutral. The bone quality appears to be excellent for age and reported  activity level.  Assessment/Plan: Primary osteoarthritis, right knee   The patient history, physical examination and imaging studies are consistent with advanced degenerative joint disease of the right knee. The patient has failed conservative treatment.  The clearance notes were reviewed.  After discussion with the patient it was felt that Total Knee Replacement was  indicated. The procedure,  risks, and benefits of total knee arthroplasty were presented and reviewed. The risks including but not limited to aseptic loosening, infection, blood clots, vascular injury, stiffness, patella tracking problems complications among others were discussed. The patient acknowledged the explanation, agreed to proceed with the plan.  Donia Ast 05/18/2017, 9:11 AM

## 2017-05-18 NOTE — Anesthesia Procedure Notes (Addendum)
Anesthesia Regional Block: Femoral nerve block   Pre-Anesthetic Checklist: ,, timeout performed, Correct Patient, Correct Site, Correct Laterality, Correct Procedure, Correct Position, site marked, Risks and benefits discussed,  Surgical consent,  Pre-op evaluation,  At surgeon's request and post-op pain management  Laterality: Right  Prep: chloraprep       Needles:  Injection technique: Single-shot  Needle Type: Echogenic Needle     Needle Length: 9cm      Additional Needles:   Procedures:,,,, ultrasound used (permanent image in chart),,,,  Narrative:  Start time: 05/18/2017 11:29 AM End time: 05/18/2017 11:39 AM Injection made incrementally with aspirations every 5 mL.  Performed by: Personally  Anesthesiologist: Myrtie Soman, MD  Additional Notes: Patient tolerated the procedure well without complications

## 2017-05-18 NOTE — Anesthesia Procedure Notes (Signed)
Anesthesia Procedure Image    

## 2017-05-18 NOTE — Anesthesia Preprocedure Evaluation (Addendum)
Anesthesia Evaluation  Patient identified by MRN, date of birth, ID band Patient awake    Reviewed: Allergy & Precautions, NPO status , Patient's Chart, lab work & pertinent test results  History of Anesthesia Complications (+) PONV  Airway Mallampati: II  TM Distance: >3 FB Neck ROM: Full    Dental no notable dental hx.    Pulmonary neg pulmonary ROS, former smoker,    Pulmonary exam normal breath sounds clear to auscultation       Cardiovascular hypertension, + Peripheral Vascular Disease  Normal cardiovascular exam Rhythm:Regular Rate:Normal     Neuro/Psych negative neurological ROS  negative psych ROS   GI/Hepatic Neg liver ROS, GERD  ,  Endo/Other  negative endocrine ROS  Renal/GU negative Renal ROS  negative genitourinary   Musculoskeletal negative musculoskeletal ROS (+)   Abdominal   Peds negative pediatric ROS (+)  Hematology negative hematology ROS (+)   Anesthesia Other Findings   Reproductive/Obstetrics negative OB ROS                             Anesthesia Physical Anesthesia Plan  ASA: III  Anesthesia Plan: General   Post-op Pain Management:    Induction: Intravenous  PONV Risk Score and Plan: 2 and Ondansetron, Dexamethasone, Propofol infusion and TIVA  Airway Management Planned: Simple Face Mask and Oral ETT  Additional Equipment:   Intra-op Plan:   Post-operative Plan:   Informed Consent: I have reviewed the patients History and Physical, chart, labs and discussed the procedure including the risks, benefits and alternatives for the proposed anesthesia with the patient or authorized representative who has indicated his/her understanding and acceptance.   Dental advisory given  Plan Discussed with: CRNA and Surgeon  Anesthesia Plan Comments: (GA with TIVA)       Anesthesia Quick Evaluation

## 2017-05-18 NOTE — Progress Notes (Addendum)
Called to short stay room approximately 40 minutes after femoral block placed. Patient was extremely naseous with vomiting. No c\o chest pain or tightness. Mild abdominal discomfort. Treated with zofran, decadron, reglan. Very little improvement, Dr. Ronnie Derby came by to see patient and has decided to postpone surgery today.  Patient ended up waiting an extra 90 minutes to leave because there was a miscommunication in giving him a leg immobilizer. [place] continued until approximately to 4 pm      Patient was given 1 gm tylenol po approximately 45 minutes to one hour prior to N/V. Hold po tylenol when he returns, give IV instead

## 2017-05-18 NOTE — Progress Notes (Signed)
I was notified at 12:55 that the patient was experiencing severe nausea after the femoral nerve block.  Patient was visually ok when I left him and vitals were stable patient was talking and laughing with no complaints of nausea or vomiting.  After receiving message that patient was nauseated I Immediately called Dr. Kalman Shan for Zofran orders.  I gave Zofran and at that time patient was vomiting.  The CRNA entered the room to help.  Dr. Kalman Shan was also called to come back to see the patient.  Another 4mg  versed given to patient was receiving no relief from the Zofran.  Dr. Kalman Shan then ordered 10mg  of Reglan which allowed the patient some relief. Patient has had one episode of vomiting since the Reglan was given. EKG obtained.  12:40 PM   RN and CNA is currently with patient to help with relief

## 2017-05-18 NOTE — Progress Notes (Signed)
Verbal report given to [facility] in the PACU

## 2017-05-18 NOTE — Progress Notes (Signed)
Orthopedic Tech Progress Note Patient Details:  [name] 10-19-1950 [id number]  Ortho Devices Type of Ortho Device: Knee Immobilizer Ortho Device/Splint Location: applied knee immobilizer on pt right leg/knee.  pt tolerated application well.  Right leg. Ortho Device/Splint Interventions: Application, Adjustment   Post Interventions Patient Tolerated: Well Instructions Provided: Adjustment of device, Care of device   Kristopher Oppenheim 05/18/2017, 6:41 PM

## 2017-05-18 NOTE — Progress Notes (Signed)
Patient arrived in PACU from pre-op to be d/c home with instructions patient needs to walk with walker. Several attempts to help patient ambulate without success. Patient was unable to stand and/or step safely. Dr. Ola Spurr rounding in PACU and recommended patient's need for straight leg immobilizer. Once immobilizer was put on by ortho tech, patient was able to stand and walk safely. Discharged home with wife and Dr.Rose notified.

## 2017-07-15 DIAGNOSIS — E782 Mixed hyperlipidemia: Secondary | ICD-10-CM | POA: Diagnosis not present

## 2017-07-15 DIAGNOSIS — D696 Thrombocytopenia, unspecified: Secondary | ICD-10-CM | POA: Diagnosis not present

## 2017-07-15 DIAGNOSIS — G894 Chronic pain syndrome: Secondary | ICD-10-CM | POA: Diagnosis not present

## 2017-07-15 DIAGNOSIS — I712 Thoracic aortic aneurysm, without rupture: Secondary | ICD-10-CM | POA: Diagnosis not present

## 2017-07-15 DIAGNOSIS — Z1389 Encounter for screening for other disorder: Secondary | ICD-10-CM | POA: Diagnosis not present

## 2017-07-15 DIAGNOSIS — G4730 Sleep apnea, unspecified: Secondary | ICD-10-CM | POA: Diagnosis not present

## 2017-07-15 DIAGNOSIS — R040 Epistaxis: Secondary | ICD-10-CM | POA: Diagnosis not present

## 2017-07-17 DIAGNOSIS — J209 Acute bronchitis, unspecified: Secondary | ICD-10-CM | POA: Diagnosis not present

## 2017-07-17 DIAGNOSIS — J029 Acute pharyngitis, unspecified: Secondary | ICD-10-CM | POA: Diagnosis not present

## 2017-07-22 ENCOUNTER — Other Ambulatory Visit (HOSPITAL_COMMUNITY): Payer: Self-pay | Admitting: *Deleted

## 2017-07-22 DIAGNOSIS — D696 Thrombocytopenia, unspecified: Secondary | ICD-10-CM

## 2017-07-24 ENCOUNTER — Inpatient Hospital Stay (HOSPITAL_COMMUNITY): Payer: Medicare Other | Attending: Oncology

## 2017-07-24 DIAGNOSIS — D696 Thrombocytopenia, unspecified: Secondary | ICD-10-CM | POA: Diagnosis not present

## 2017-07-24 LAB — CBC WITH DIFFERENTIAL/PLATELET
Basophils Absolute: 0 10*3/uL (ref 0.0–0.1)
Basophils Relative: 0 %
Eosinophils Absolute: 0 10*3/uL (ref 0.0–0.7)
Eosinophils Relative: 0 %
HCT: 45.5 % (ref 39.0–52.0)
Hemoglobin: 15.1 g/dL (ref 13.0–17.0)
Lymphocytes Relative: 40 %
Lymphs Abs: 1.7 10*3/uL (ref 0.7–4.0)
MCH: 30.3 pg (ref 26.0–34.0)
MCHC: 33.2 g/dL (ref 30.0–36.0)
MCV: 91.4 fL (ref 78.0–100.0)
Monocytes Absolute: 0.4 10*3/uL (ref 0.1–1.0)
Monocytes Relative: 9 %
Neutro Abs: 2.2 10*3/uL (ref 1.7–7.7)
Neutrophils Relative %: 51 %
Platelets: 121 10*3/uL — ABNORMAL LOW (ref 150–400)
RBC: 4.98 MIL/uL (ref 4.22–5.81)
RDW: 12.9 % (ref 11.5–15.5)
WBC: 4.3 10*3/uL (ref 4.0–10.5)

## 2017-07-24 LAB — COMPREHENSIVE METABOLIC PANEL
ALT: 25 U/L (ref 17–63)
AST: 35 U/L (ref 15–41)
Albumin: 4.3 g/dL (ref 3.5–5.0)
Alkaline Phosphatase: 65 U/L (ref 38–126)
Anion gap: 13 (ref 5–15)
BUN: 23 mg/dL — ABNORMAL HIGH (ref 6–20)
CO2: 22 mmol/L (ref 22–32)
Calcium: 10.1 mg/dL (ref 8.9–10.3)
Chloride: 101 mmol/L (ref 101–111)
Creatinine, Ser: 1.18 mg/dL (ref 0.61–1.24)
GFR calc Af Amer: >60 mL/min (ref >60)
GFR calc non Af Amer: >60 mL/min (ref >60)
Glucose, Bld: 87 mg/dL (ref 65–99)
Potassium: 4.2 mmol/L (ref 3.5–5.1)
Sodium: 136 mmol/L (ref 135–145)
Total Bilirubin: 0.8 mg/dL (ref 0.3–1.2)
Total Protein: 7.8 g/dL (ref 6.5–8.1)

## 2017-07-31 ENCOUNTER — Ambulatory Visit (HOSPITAL_COMMUNITY): Payer: Medicare Other | Admitting: Adult Health

## 2017-08-17 ENCOUNTER — Ambulatory Visit (INDEPENDENT_AMBULATORY_CARE_PROVIDER_SITE_OTHER): Payer: Medicare Other | Admitting: Otolaryngology

## 2017-12-28 ENCOUNTER — Other Ambulatory Visit (HOSPITAL_COMMUNITY): Payer: Self-pay | Admitting: Internal Medicine

## 2017-12-28 ENCOUNTER — Ambulatory Visit (HOSPITAL_COMMUNITY)
Admission: RE | Admit: 2017-12-28 | Discharge: 2017-12-28 | Disposition: A | Discharge status: Home / Self Care | Payer: Medicare Other | Source: Ambulatory Visit | Attending: Internal Medicine | Admitting: Internal Medicine

## 2017-12-28 DIAGNOSIS — R609 Edema, unspecified: Secondary | ICD-10-CM

## 2017-12-28 DIAGNOSIS — M25572 Pain in left ankle and joints of left foot: Secondary | ICD-10-CM | POA: Insufficient documentation

## 2017-12-28 DIAGNOSIS — M79662 Pain in left lower leg: Secondary | ICD-10-CM | POA: Diagnosis not present

## 2017-12-28 DIAGNOSIS — S99912A Unspecified injury of left ankle, initial encounter: Secondary | ICD-10-CM | POA: Diagnosis not present

## 2017-12-28 DIAGNOSIS — M7989 Other specified soft tissue disorders: Secondary | ICD-10-CM | POA: Insufficient documentation

## 2017-12-28 DIAGNOSIS — M79605 Pain in left leg: Secondary | ICD-10-CM | POA: Diagnosis not present

## 2017-12-29 DIAGNOSIS — Z0001 Encounter for general adult medical examination with abnormal findings: Secondary | ICD-10-CM | POA: Diagnosis not present

## 2017-12-29 DIAGNOSIS — G894 Chronic pain syndrome: Secondary | ICD-10-CM | POA: Diagnosis not present

## 2017-12-29 DIAGNOSIS — K219 Gastro-esophageal reflux disease without esophagitis: Secondary | ICD-10-CM | POA: Diagnosis not present

## 2017-12-29 DIAGNOSIS — Z1389 Encounter for screening for other disorder: Secondary | ICD-10-CM | POA: Diagnosis not present

## 2018-01-26 ENCOUNTER — Encounter (INDEPENDENT_AMBULATORY_CARE_PROVIDER_SITE_OTHER): Payer: Self-pay | Admitting: *Deleted

## 2018-01-26 ENCOUNTER — Other Ambulatory Visit (INDEPENDENT_AMBULATORY_CARE_PROVIDER_SITE_OTHER): Payer: Self-pay | Admitting: Internal Medicine

## 2018-01-26 ENCOUNTER — Encounter (INDEPENDENT_AMBULATORY_CARE_PROVIDER_SITE_OTHER): Payer: Self-pay | Admitting: Internal Medicine

## 2018-01-26 ENCOUNTER — Ambulatory Visit (INDEPENDENT_AMBULATORY_CARE_PROVIDER_SITE_OTHER): Payer: Medicare Other | Admitting: Internal Medicine

## 2018-01-26 ENCOUNTER — Telehealth (INDEPENDENT_AMBULATORY_CARE_PROVIDER_SITE_OTHER): Payer: Self-pay | Admitting: *Deleted

## 2018-01-26 VITALS — BP 160/80 | HR 66 | Temp 98.0°F | Resp 18 | Ht 72.0 in | Wt 242.1 lb

## 2018-01-26 DIAGNOSIS — K219 Gastro-esophageal reflux disease without esophagitis: Secondary | ICD-10-CM

## 2018-01-26 DIAGNOSIS — K3184 Gastroparesis: Secondary | ICD-10-CM | POA: Diagnosis not present

## 2018-01-26 DIAGNOSIS — Z1211 Encounter for screening for malignant neoplasm of colon: Secondary | ICD-10-CM

## 2018-01-26 DIAGNOSIS — K2270 Barrett's esophagus without dysplasia: Secondary | ICD-10-CM | POA: Insufficient documentation

## 2018-01-26 DIAGNOSIS — Z8719 Personal history of other diseases of the digestive system: Secondary | ICD-10-CM

## 2018-01-26 MED ORDER — SUPREP BOWEL PREP KIT 17.5-3.13-1.6 GM/177ML PO SOLN: 1.0000 | Freq: Once | ORAL | 0 refills | Status: AC

## 2018-01-26 NOTE — Progress Notes (Signed)
Presenting complaint;  Follow-up for chronic GERD, gastroparesis.  Database and subjective:  Patient is 67 year old Caucasian male who is here for scheduled visit.  He was last seen 1 year ago.  He has chronic GERD complicated by short segment Barrett's esophagus.  He has undergone 3 antireflux surgeries.  Last one was via thoracic approach.  Following his last visit he was scheduled to undergo screening colonoscopy but because of back issues he was not able to proceed with it.  He states he is doing well.  He has taken domperidone twice daily instead of 3 times a day.  He is not having any side effects.  He has heartburn no more than 2-3 times a week but is not longer bad.  He may have occasional regurgitation.  He denies dysphagia nausea or vomiting.  His appetite is good.  He has not gained or lost any weight since his last visit.  He is not having 1 formed stool daily.  He thought domperidone 3 times a day resulted in diarrhea.  He denies melena or rectal bleeding. He has chronic back pain.  He states he is taking oxycodone on as-needed basis.   Current Medications: Outpatient Encounter Medications as of 01/26/2018  Medication Sig  . Bioflavonoid Products (ESTER C PO) Take 1 tablet by mouth daily.  . calcium-vitamin D (OSCAL WITH D) 500-200 MG-UNIT per tablet Take 1 tablet by mouth 2 (two) times daily.   . cyclobenzaprine (FLEXERIL) 10 MG tablet Take 1 tablet (10 mg total) by mouth 3 (three) times daily as needed for muscle spasms.  Marland Kitchen dexlansoprazole (DEXILANT) 60 MG capsule Take 60 mg by mouth daily.  . diazepam (VALIUM) 10 MG tablet Take 10 mg by mouth 3 (three) times daily as needed (for back pain).  . fexofenadine (ALLEGRA) 180 MG tablet Take 180 mg by mouth every evening.  . fish oil-omega-3 fatty acids 1000 MG capsule Take 1 g by mouth 2 (two) times daily.  Marland Kitchen lisinopril (PRINIVIL,ZESTRIL) 10 MG tablet Take 10 mg by mouth daily.  . montelukast (SINGULAIR) 10 MG tablet Take 10 mg by  mouth daily.   . Multiple Vitamin (MULITIVITAMIN WITH MINERALS) TABS Take 1 tablet by mouth daily.  . ondansetron (ZOFRAN ODT) 4 MG disintegrating tablet 4mg  ODT q4 hours prn nausea/vomit (Patient taking differently: Take 4 mg by mouth every 4 (four) hours as needed for nausea or vomiting. )  . oxyCODONE-acetaminophen (PERCOCET) 10-325 MG tablet Take 1 tablet by mouth every 6 (six) hours as needed for pain.  Marland Kitchen PRESCRIPTION MEDICATION Take 10 mg by mouth 2 (two) times daily. Domperidone 10 mg from [place]  . rosuvastatin (CRESTOR) 10 MG tablet Take 10 mg by mouth every evening.   . [DISCONTINUED] gabapentin (NEURONTIN) 600 MG tablet Take 600 mg by mouth 3 (three) times daily.  . [DISCONTINUED] fluticasone (FLONASE) 50 MCG/ACT nasal spray Place 2 sprays into both nostrils daily.  . [DISCONTINUED] hydrOXYzine (ATARAX/VISTARIL) 25 MG tablet Take 1 tablet (25 mg total) by mouth every 6 (six) hours as needed for nausea or vomiting. (Patient not taking: Reported on 01/26/2018)  . [DISCONTINUED] traMADol (ULTRAM) 50 MG tablet Take 2 tablets (100 mg total) by mouth every 6 (six) hours as needed for moderate pain. (Patient not taking: Reported on 05/05/2017)   No facility-administered encounter medications on file as of 01/26/2018.      Objective: Blood pressure (!) 160/80, pulse 66, temperature 98 F (36.7 C), temperature source Oral, resp. rate 18, height 6' (1.829 m),  weight 242 lb 1.6 oz (109.8 kg). Patient is alert and in no acute distress. Conjunctiva is pink. Sclera is nonicteric Oropharyngeal mucosa is normal. No neck masses or thyromegaly noted. Cardiac exam with regular rhythm normal S1 and S2. No murmur or gallop noted. Lungs are clear to auscultation. Abdomen is full but soft and nontender with organomegaly or masses. No LE edema or clubbing noted.    Assessment:  #1.  Chronic GERD.  He has a history of short segment Barrett's esophagus but esophageal biopsy in December 2017 was  negative for Barrett's which could be sampling error.  He continues to have 2-3 episodes of heartburn every week despite taking medication.  However he does not have alarm symptoms.  #2.  Gastroparesis.  Patient is on dietary measures and domperidone 10 mg twice daily.   Plan:  Patient will continue dexlansoprazole and domperidone at current dose. We will schedule him for surveillance EGD and average risk screening colonoscopy in near future. Office visit in 1 year.

## 2018-01-26 NOTE — Telephone Encounter (Signed)
Patient needs suprep 

## 2018-01-26 NOTE — Patient Instructions (Signed)
Esophagogastroduodenoscopy and colonoscopy to be scheduled 

## 2018-02-08 ENCOUNTER — Telehealth (INDEPENDENT_AMBULATORY_CARE_PROVIDER_SITE_OTHER): Payer: Self-pay | Admitting: Internal Medicine

## 2018-02-08 NOTE — Telephone Encounter (Signed)
Patient was called and he states that he thinks he found the number.

## 2018-02-08 NOTE — Telephone Encounter (Signed)
Patient left message stating he needs a [place] phone number to order domperidone - please call back at [phone]

## 2018-02-11 ENCOUNTER — Other Ambulatory Visit (HOSPITAL_COMMUNITY): Payer: Medicare Other

## 2018-02-11 NOTE — Patient Instructions (Signed)
[name] [facility]  02/11/2018     @PREFPERIOPPHARMACY @   Your procedure is scheduled on  02/19/2018   Report to [facility] at  1130   A.M.  Call this number if you have problems the morning of surgery:  [phone]   Remember:  Do not eat or drink after midnight.  You may drink clear liquids until (follow the instructions given to you) .  Clear liquids allowed are:                    Water, Juice (non-citric and without pulp), Carbonated beverages, Clear Tea, Black Coffee only, Plain Jell-O only, Gatorade and Plain Popsicles only    Take these medicines the morning of surgery with A SIP OF WATER  Flexaril, dexilant, valium ( if needed), allegra or singulair, lisinopril, zofran ( if needed), percocet ( if needed), lyrica.    Do not wear jewelry, make-up or nail polish.  Do not wear lotions, powders, or perfumes, or deodorant.  Do not shave 48 hours prior to surgery.  Men may shave face and neck.  Do not bring valuables to the hospital.  [facility] is not responsible for any belongings or valuables.  Contacts, dentures or bridgework may not be worn into surgery.  Leave your suitcase in the car.  After surgery it may be brought to your room.  For patients admitted to the hospital, discharge time will be determined by your treatment team.  Patients discharged the day of surgery will not be allowed to drive home.   Name and phone number of your driver:   family Special instructions:  Follow the diet and prep instructions given to you by Dr Olevia Perches office.  Please read over the following fact sheets that you were given. Anesthesia Post-op Instructions and Care and Recovery After Surgery       Esophagogastroduodenoscopy Esophagogastroduodenoscopy (EGD) is a procedure to examine the lining of the esophagus, stomach, and first part of the small intestine (duodenum). This procedure is done to check for problems such as inflammation, bleeding, ulcers, or  growths. During this procedure, a long, flexible, lighted tube with a camera attached (endoscope) is inserted down the throat. Tell a health care provider about:  Any allergies you have.  All medicines you are taking, including vitamins, herbs, eye drops, creams, and over-the-counter medicines.  Any problems you or family members have had with anesthetic medicines.  Any blood disorders you have.  Any surgeries you have had.  Any medical conditions you have.  Whether you are pregnant or may be pregnant. What are the risks? Generally, this is a safe procedure. However, problems may occur, including:  Infection.  Bleeding.  A tear (perforation) in the esophagus, stomach, or duodenum.  Trouble breathing.  Excessive sweating.  Spasms of the larynx.  A slowed heartbeat.  Low blood pressure.  What happens before the procedure?  Follow instructions from your health care provider about eating or drinking restrictions.  Ask your health care provider about: ? Changing or stopping your regular medicines. This is especially important if you are taking diabetes medicines or blood thinners. ? Taking medicines such as aspirin and ibuprofen. These medicines can thin your blood. Do not take these medicines before your procedure if your health care provider instructs you not to.  Plan to have someone take you home after the procedure.  If you wear dentures, be ready to remove them  before the procedure. What happens during the procedure?  To reduce your risk of infection, your health care team will wash or sanitize their hands.  An IV tube will be put in a vein in your hand or arm. You will get medicines and fluids through this tube.  You will be given one or more of the following: ? A medicine to help you relax (sedative). ? A medicine to numb the area (local anesthetic). This medicine may be sprayed into your throat. It will make you feel more comfortable and keep you from  gagging or coughing during the procedure. ? A medicine for pain.  A mouth guard may be placed in your mouth to protect your teeth and to keep you from biting on the endoscope.  You will be asked to lie on your left side.  The endoscope will be lowered down your throat into your esophagus, stomach, and duodenum.  Air will be put into the endoscope. This will help your health care provider see better.  The lining of your esophagus, stomach, and duodenum will be examined.  Your health care provider may: ? Take a tissue sample so it can be looked at in a lab (biopsy). ? Remove growths. ? Remove objects (foreign bodies) that are stuck. ? Treat any bleeding with medicines or other devices that stop tissue from bleeding. ? Widen (dilate) or stretch narrowed areas of your esophagus and stomach.  The endoscope will be taken out. The procedure may vary among health care providers and hospitals. What happens after the procedure?  Your blood pressure, heart rate, breathing rate, and blood oxygen level will be monitored often until the medicines you were given have worn off.  Do not eat or drink anything until the numbing medicine has worn off and your gag reflex has returned. This information is not intended to replace advice given to you by your health care provider. Make sure you discuss any questions you have with your health care provider. Document Released: 09/19/2004 Document Revised: 10/25/2015 Document Reviewed: 04/12/2015 Elsevier Interactive Patient Education  2018 [employer]. Esophagogastroduodenoscopy, Care After Refer to this sheet in the next few weeks. These instructions provide you with information about caring for yourself after your procedure. Your health care provider may also give you more specific instructions. Your treatment has been planned according to current medical practices, but problems sometimes occur. Call your health care provider if you have any problems or  questions after your procedure. What can I expect after the procedure? After the procedure, it is common to have:  A sore throat.  Nausea.  Bloating.  Dizziness.  Fatigue.  Follow these instructions at home:  Do not eat or drink anything until the numbing medicine (local anesthetic) has worn off and your gag reflex has returned. You will know that the local anesthetic has worn off when you can swallow comfortably.  Do not drive for 24 hours if you received a medicine to help you relax (sedative).  If your health care provider took a tissue sample for testing during the procedure, make sure to get your test results. This is your responsibility. Ask your health care provider or the department performing the test when your results will be ready.  Keep all follow-up visits as told by your health care provider. This is important. Contact a health care provider if:  You cannot stop coughing.  You are not urinating.  You are urinating less than usual. Get help right away if:  You have trouble  swallowing.  You cannot eat or drink.  You have throat or chest pain that gets worse.  You are dizzy or light-headed.  You faint.  You have nausea or vomiting.  You have chills.  You have a fever.  You have severe abdominal pain.  You have black, tarry, or bloody stools. This information is not intended to replace advice given to you by your health care provider. Make sure you discuss any questions you have with your health care provider. Document Released: 05/05/2012 Document Revised: 10/25/2015 Document Reviewed: 04/12/2015 Elsevier Interactive Patient Education  2018 [employer].  Colonoscopy, Adult A colonoscopy is an exam to look at the large intestine. It is done to check for problems, such as:  Lumps (tumors).  Growths (polyps).  Swelling (inflammation).  Bleeding.  What happens before the procedure? Eating and drinking Follow instructions from your doctor  about eating and drinking. These instructions may include:  A few days before the procedure - follow a low-fiber diet. ? Avoid nuts. ? Avoid seeds. ? Avoid dried fruit. ? Avoid raw fruits. ? Avoid vegetables.  1-3 days before the procedure - follow a clear liquid diet. Avoid liquids that have red or purple dye. Drink only clear liquids, such as: ? Clear broth or bouillon. ? Black coffee or tea. ? Clear juice. ? Clear soft drinks or sports drinks. ? Gelatin dessert. ? Popsicles.  On the day of the procedure - do not eat or drink anything during the 2 hours before the procedure.  Bowel prep If you were prescribed an oral bowel prep:  Take it as told by your doctor. Starting the day before your procedure, you will need to drink a lot of liquid. The liquid will cause you to poop (have bowel movements) until your poop is almost clear or light green.  If your skin or butt gets irritated from diarrhea, you may: ? Wipe the area with wipes that have medicine in them, such as adult wet wipes with aloe and vitamin E. ? Put something on your skin that soothes the area, such as petroleum jelly.  If you throw up (vomit) while drinking the bowel prep, take a break for up to 60 minutes. Then begin the bowel prep again. If you keep throwing up and you cannot take the bowel prep without throwing up, call your doctor.  General instructions  Ask your doctor about changing or stopping your normal medicines. This is important if you take diabetes medicines or blood thinners.  Plan to have someone take you home from the hospital or clinic. What happens during the procedure?  An IV tube may be put into one of your veins.  You will be given medicine to help you relax (sedative).  To reduce your risk of infection: ? Your doctors will wash their hands. ? Your anal area will be washed with soap.  You will be asked to lie on your side with your knees bent.  Your doctor will get a long, thin,  flexible tube ready. The tube will have a camera and a light on the end.  The tube will be put into your anus.  The tube will be gently put into your large intestine.  Air will be delivered into your large intestine to keep it open. You may feel some pressure or cramping.  The camera will be used to take photos.  A small tissue sample may be removed from your body to be looked at under a microscope (biopsy). If any possible  problems are found, the tissue will be sent to a lab for testing.  If small growths are found, your doctor may remove them and have them checked for cancer.  The tube that was put into your anus will be slowly removed. The procedure may vary among doctors and hospitals. What happens after the procedure?  Your doctor will check on you often until the medicines you were given have worn off.  Do not drive for 24 hours after the procedure.  You may have a small amount of blood in your poop.  You may pass gas.  You may have mild cramps or bloating in your belly (abdomen).  It is up to you to get the results of your procedure. Ask your doctor, or the department performing the procedure, when your results will be ready. This information is not intended to replace advice given to you by your health care provider. Make sure you discuss any questions you have with your health care provider. Document Released: 06/21/2010 Document Revised: 03/19/2016 Document Reviewed: 07/31/2015 Elsevier Interactive Patient Education  2017 [employer]  Colonoscopy, Adult, Care After This sheet gives you information about how to care for yourself after your procedure. Your health care provider may also give you more specific instructions. If you have problems or questions, contact your health care provider. What can I expect after the procedure? After the procedure, it is common to have:  A small amount of blood in your stool for 24 hours after the procedure.  Some gas.  Mild  abdominal cramping or bloating.  Follow these instructions at home: General instructions   For the first 24 hours after the procedure: ? Do not drive or use machinery. ? Do not sign important documents. ? Do not drink alcohol. ? Do your regular daily activities at a slower pace than normal. ? Eat soft, easy-to-digest foods. ? Rest often.  Take over-the-counter or prescription medicines only as told by your health care provider.  It is up to you to get the results of your procedure. Ask your health care provider, or the department performing the procedure, when your results will be ready. Relieving cramping and bloating  Try walking around when you have cramps or feel bloated.  Apply heat to your abdomen as told by your health care provider. Use a heat source that your health care provider recommends, such as a moist heat pack or a heating pad. ? Place a towel between your skin and the heat source. ? Leave the heat on for 20-30 minutes. ? Remove the heat if your skin turns bright red. This is especially important if you are unable to feel pain, heat, or cold. You may have a greater risk of getting burned. Eating and drinking  Drink enough fluid to keep your urine clear or pale yellow.  Resume your normal diet as instructed by your health care provider. Avoid heavy or fried foods that are hard to digest.  Avoid drinking alcohol for as long as instructed by your health care provider. Contact a health care provider if:  You have blood in your stool 2-3 days after the procedure. Get help right away if:  You have more than a small spotting of blood in your stool.  You pass large blood clots in your stool.  Your abdomen is swollen.  You have nausea or vomiting.  You have a fever.  You have increasing abdominal pain that is not relieved with medicine. This information is not intended to replace advice given  to you by your health care provider. Make sure you discuss any  questions you have with your health care provider. Document Released: 01/01/2004 Document Revised: 02/11/2016 Document Reviewed: 07/31/2015 Elsevier Interactive Patient Education  2018 [employer] [place] Anesthesia is a term that refers to techniques, procedures, and medicines that help a person stay safe and comfortable during a medical procedure. Monitored anesthesia care, or sedation, is one type of anesthesia. Your anesthesia specialist may recommend sedation if you will be having a procedure that does not require you to be unconscious, such as:  Cataract surgery.  A dental procedure.  A biopsy.  A colonoscopy.  During the procedure, you may receive a medicine to help you relax (sedative). There are three levels of sedation:  Mild sedation. At this level, you may feel awake and relaxed. You will be able to follow directions.  Moderate sedation. At this level, you will be sleepy. You may not remember the procedure.  Deep sedation. At this level, you will be asleep. You will not remember the procedure.  The more medicine you are given, the deeper your level of sedation will be. Depending on how you respond to the procedure, the anesthesia specialist may change your level of sedation or the type of anesthesia to fit your needs. An anesthesia specialist will monitor you closely during the procedure. Let your health care provider know about:  Any allergies you have.  All medicines you are taking, including vitamins, herbs, eye drops, creams, and over-the-counter medicines.  Any use of steroids (by mouth or as a cream).  Any problems you or family members have had with sedatives and anesthetic medicines.  Any blood disorders you have.  Any surgeries you have had.  Any medical conditions you have, such as sleep apnea.  Whether you are pregnant or may be pregnant.  Any use of cigarettes, alcohol, or street drugs. What are the risks? Generally, this is a  safe procedure. However, problems may occur, including:  Getting too much medicine (oversedation).  Nausea.  Allergic reaction to medicines.  Trouble breathing. If this happens, a breathing tube may be used to help with breathing. It will be removed when you are awake and breathing on your own.  Heart trouble.  Lung trouble.  Before the procedure Staying hydrated Follow instructions from your health care provider about hydration, which may include:  Up to 2 hours before the procedure - you may continue to drink clear liquids, such as water, clear fruit juice, black coffee, and plain tea.  Eating and drinking restrictions Follow instructions from your health care provider about eating and drinking, which may include:  8 hours before the procedure - stop eating heavy meals or foods such as meat, fried foods, or fatty foods.  6 hours before the procedure - stop eating light meals or foods, such as toast or cereal.  6 hours before the procedure - stop drinking milk or drinks that contain milk.  2 hours before the procedure - stop drinking clear liquids.  Medicines Ask your health care provider about:  Changing or stopping your regular medicines. This is especially important if you are taking diabetes medicines or blood thinners.  Taking medicines such as aspirin and ibuprofen. These medicines can thin your blood. Do not take these medicines before your procedure if your health care provider instructs you not to.  Tests and exams  You will have a physical exam.  You may have blood tests done to show: ? How well  your kidneys and liver are working. ? How well your blood can clot.  General instructions  Plan to have someone take you home from the hospital or clinic.  If you will be going home right after the procedure, plan to have someone with you for 24 hours.  What happens during the procedure?  Your blood pressure, heart rate, breathing, level of pain and overall  condition will be monitored.  An IV tube will be inserted into one of your veins.  Your anesthesia specialist will give you medicines as needed to keep you comfortable during the procedure. This may mean changing the level of sedation.  The procedure will be performed. After the procedure  Your blood pressure, heart rate, breathing rate, and blood oxygen level will be monitored until the medicines you were given have worn off.  Do not drive for 24 hours if you received a sedative.  You may: ? Feel sleepy, clumsy, or nauseous. ? Feel forgetful about what happened after the procedure. ? Have a sore throat if you had a breathing tube during the procedure. ? Vomit. This information is not intended to replace advice given to you by your health care provider. Make sure you discuss any questions you have with your health care provider. Document Released: 02/12/2005 Document Revised: 10/26/2015 Document Reviewed: 09/09/2015 Elsevier Interactive Patient Education  2018 [place], Care After These instructions provide you with information about caring for yourself after your procedure. Your health care provider may also give you more specific instructions. Your treatment has been planned according to current medical practices, but problems sometimes occur. Call your health care provider if you have any problems or questions after your procedure. What can I expect after the procedure? After your procedure, it is common to:  Feel sleepy for several hours.  Feel clumsy and have poor balance for several hours.  Feel forgetful about what happened after the procedure.  Have poor judgment for several hours.  Feel nauseous or vomit.  Have a sore throat if you had a breathing tube during the procedure.  Follow these instructions at home: For at least 24 hours after the procedure:   Do not: ? Participate in activities in which you could fall or become  injured. ? Drive. ? Use heavy machinery. ? Drink alcohol. ? Take sleeping pills or medicines that cause drowsiness. ? Make important decisions or sign legal documents. ? Take care of children on your own.  Rest. Eating and drinking  Follow the diet that is recommended by your health care provider.  If you vomit, drink water, juice, or soup when you can drink without vomiting.  Make sure you have little or no nausea before eating solid foods. General instructions  Have a responsible adult stay with you until you are awake and alert.  Take over-the-counter and prescription medicines only as told by your health care provider.  If you smoke, do not smoke without supervision.  Keep all follow-up visits as told by your health care provider. This is important. Contact a health care provider if:  You keep feeling nauseous or you keep vomiting.  You feel light-headed.  You develop a rash.  You have a fever. Get help right away if:  You have trouble breathing. This information is not intended to replace advice given to you by your health care provider. Make sure you discuss any questions you have with your health care provider. Document Released: 09/09/2015 Document Revised: 01/09/2016 Document Reviewed: 09/09/2015 Elsevier Interactive  Patient Education  [employer].

## 2018-02-16 ENCOUNTER — Other Ambulatory Visit: Payer: Self-pay

## 2018-02-16 ENCOUNTER — Encounter (HOSPITAL_COMMUNITY): Payer: Self-pay

## 2018-02-16 ENCOUNTER — Encounter (HOSPITAL_COMMUNITY)
Admission: RE | Admit: 2018-02-16 | Discharge: 2018-02-16 | Disposition: A | Discharge status: Home / Self Care | Payer: Medicare Other | Source: Ambulatory Visit | Attending: Internal Medicine | Admitting: Internal Medicine

## 2018-02-16 DIAGNOSIS — K3184 Gastroparesis: Secondary | ICD-10-CM | POA: Diagnosis not present

## 2018-02-16 DIAGNOSIS — K644 Residual hemorrhoidal skin tags: Secondary | ICD-10-CM | POA: Diagnosis not present

## 2018-02-16 DIAGNOSIS — K2270 Barrett's esophagus without dysplasia: Secondary | ICD-10-CM

## 2018-02-16 DIAGNOSIS — K589 Irritable bowel syndrome without diarrhea: Secondary | ICD-10-CM | POA: Diagnosis not present

## 2018-02-16 DIAGNOSIS — I2510 Atherosclerotic heart disease of native coronary artery without angina pectoris: Secondary | ICD-10-CM | POA: Diagnosis not present

## 2018-02-16 DIAGNOSIS — I10 Essential (primary) hypertension: Secondary | ICD-10-CM | POA: Diagnosis not present

## 2018-02-16 DIAGNOSIS — K449 Diaphragmatic hernia without obstruction or gangrene: Secondary | ICD-10-CM | POA: Diagnosis not present

## 2018-02-16 DIAGNOSIS — I712 Thoracic aortic aneurysm, without rupture: Secondary | ICD-10-CM | POA: Diagnosis not present

## 2018-02-16 DIAGNOSIS — D123 Benign neoplasm of transverse colon: Secondary | ICD-10-CM | POA: Diagnosis not present

## 2018-02-16 DIAGNOSIS — Z1211 Encounter for screening for malignant neoplasm of colon: Secondary | ICD-10-CM | POA: Diagnosis not present

## 2018-02-16 DIAGNOSIS — Z01812 Encounter for preprocedural laboratory examination: Secondary | ICD-10-CM

## 2018-02-16 DIAGNOSIS — E7800 Pure hypercholesterolemia, unspecified: Secondary | ICD-10-CM | POA: Diagnosis not present

## 2018-02-16 DIAGNOSIS — Z85820 Personal history of malignant melanoma of skin: Secondary | ICD-10-CM | POA: Diagnosis not present

## 2018-02-16 DIAGNOSIS — M1990 Unspecified osteoarthritis, unspecified site: Secondary | ICD-10-CM | POA: Diagnosis not present

## 2018-02-16 DIAGNOSIS — K219 Gastro-esophageal reflux disease without esophagitis: Secondary | ICD-10-CM | POA: Insufficient documentation

## 2018-02-16 DIAGNOSIS — F419 Anxiety disorder, unspecified: Secondary | ICD-10-CM | POA: Diagnosis not present

## 2018-02-16 DIAGNOSIS — D124 Benign neoplasm of descending colon: Secondary | ICD-10-CM | POA: Diagnosis not present

## 2018-02-16 DIAGNOSIS — Z981 Arthrodesis status: Secondary | ICD-10-CM | POA: Diagnosis not present

## 2018-02-16 DIAGNOSIS — G4730 Sleep apnea, unspecified: Secondary | ICD-10-CM | POA: Diagnosis not present

## 2018-02-16 DIAGNOSIS — I739 Peripheral vascular disease, unspecified: Secondary | ICD-10-CM | POA: Diagnosis not present

## 2018-02-16 DIAGNOSIS — Z8249 Family history of ischemic heart disease and other diseases of the circulatory system: Secondary | ICD-10-CM | POA: Diagnosis not present

## 2018-02-16 DIAGNOSIS — Z96642 Presence of left artificial hip joint: Secondary | ICD-10-CM | POA: Diagnosis not present

## 2018-02-16 DIAGNOSIS — Z87891 Personal history of nicotine dependence: Secondary | ICD-10-CM | POA: Diagnosis not present

## 2018-02-16 DIAGNOSIS — K5730 Diverticulosis of large intestine without perforation or abscess without bleeding: Secondary | ICD-10-CM | POA: Diagnosis not present

## 2018-02-16 DIAGNOSIS — Z885 Allergy status to narcotic agent status: Secondary | ICD-10-CM | POA: Diagnosis not present

## 2018-02-16 LAB — CBC
HCT: 41.2 % (ref 39.0–52.0)
Hemoglobin: 13.4 g/dL (ref 13.0–17.0)
MCH: 30.5 pg (ref 26.0–34.0)
MCHC: 32.5 g/dL (ref 30.0–36.0)
MCV: 93.8 fL (ref 78.0–100.0)
Platelets: 92 10*3/uL — ABNORMAL LOW (ref 150–400)
RBC: 4.39 MIL/uL (ref 4.22–5.81)
RDW: 13.1 % (ref 11.5–15.5)
WBC: 4.1 10*3/uL (ref 4.0–10.5)

## 2018-02-16 LAB — BASIC METABOLIC PANEL
Anion gap: 5 (ref 5–15)
BUN: 21 mg/dL (ref 8–23)
CO2: 26 mmol/L (ref 22–32)
Calcium: 8.9 mg/dL (ref 8.9–10.3)
Chloride: 109 mmol/L (ref 98–111)
Creatinine, Ser: 1.22 mg/dL (ref 0.61–1.24)
GFR calc Af Amer: >60 mL/min (ref >60)
GFR calc non Af Amer: >60 mL/min (ref >60)
Glucose, Bld: 91 mg/dL (ref 70–99)
Potassium: 4 mmol/L (ref 3.5–5.1)
Sodium: 140 mmol/L (ref 135–145)

## 2018-02-18 DIAGNOSIS — Z85828 Personal history of other malignant neoplasm of skin: Secondary | ICD-10-CM | POA: Diagnosis not present

## 2018-02-18 DIAGNOSIS — Z85820 Personal history of malignant melanoma of skin: Secondary | ICD-10-CM | POA: Diagnosis not present

## 2018-02-18 DIAGNOSIS — L608 Other nail disorders: Secondary | ICD-10-CM | POA: Diagnosis not present

## 2018-02-18 DIAGNOSIS — Z1283 Encounter for screening for malignant neoplasm of skin: Secondary | ICD-10-CM | POA: Diagnosis not present

## 2018-02-18 DIAGNOSIS — Z08 Encounter for follow-up examination after completed treatment for malignant neoplasm: Secondary | ICD-10-CM | POA: Diagnosis not present

## 2018-02-18 DIAGNOSIS — L609 Nail disorder, unspecified: Secondary | ICD-10-CM | POA: Diagnosis not present

## 2018-02-19 ENCOUNTER — Ambulatory Visit (HOSPITAL_COMMUNITY): Payer: Medicare Other | Admitting: Anesthesiology

## 2018-02-19 ENCOUNTER — Ambulatory Visit (HOSPITAL_COMMUNITY)
Admission: RE | Admit: 2018-02-19 | Discharge: 2018-02-19 | Disposition: A | Discharge status: Home / Self Care | Payer: Medicare Other | Source: Ambulatory Visit | Attending: Internal Medicine | Admitting: Internal Medicine

## 2018-02-19 ENCOUNTER — Encounter (HOSPITAL_COMMUNITY): Payer: Self-pay | Admitting: Anesthesiology

## 2018-02-19 ENCOUNTER — Encounter (HOSPITAL_COMMUNITY)
Admission: RE | Disposition: A | Discharge status: Home / Self Care | Payer: Self-pay | Source: Ambulatory Visit | Attending: Internal Medicine

## 2018-02-19 DIAGNOSIS — Z96642 Presence of left artificial hip joint: Secondary | ICD-10-CM | POA: Diagnosis not present

## 2018-02-19 DIAGNOSIS — D124 Benign neoplasm of descending colon: Secondary | ICD-10-CM | POA: Insufficient documentation

## 2018-02-19 DIAGNOSIS — Z85820 Personal history of malignant melanoma of skin: Secondary | ICD-10-CM | POA: Insufficient documentation

## 2018-02-19 DIAGNOSIS — K5730 Diverticulosis of large intestine without perforation or abscess without bleeding: Secondary | ICD-10-CM | POA: Diagnosis not present

## 2018-02-19 DIAGNOSIS — K589 Irritable bowel syndrome without diarrhea: Secondary | ICD-10-CM | POA: Diagnosis not present

## 2018-02-19 DIAGNOSIS — K2270 Barrett's esophagus without dysplasia: Secondary | ICD-10-CM | POA: Insufficient documentation

## 2018-02-19 DIAGNOSIS — Z885 Allergy status to narcotic agent status: Secondary | ICD-10-CM | POA: Insufficient documentation

## 2018-02-19 DIAGNOSIS — I10 Essential (primary) hypertension: Secondary | ICD-10-CM | POA: Diagnosis not present

## 2018-02-19 DIAGNOSIS — K208 Other esophagitis: Secondary | ICD-10-CM | POA: Diagnosis not present

## 2018-02-19 DIAGNOSIS — Z981 Arthrodesis status: Secondary | ICD-10-CM | POA: Diagnosis not present

## 2018-02-19 DIAGNOSIS — Z8249 Family history of ischemic heart disease and other diseases of the circulatory system: Secondary | ICD-10-CM | POA: Insufficient documentation

## 2018-02-19 DIAGNOSIS — Z79899 Other long term (current) drug therapy: Secondary | ICD-10-CM | POA: Insufficient documentation

## 2018-02-19 DIAGNOSIS — K449 Diaphragmatic hernia without obstruction or gangrene: Secondary | ICD-10-CM | POA: Diagnosis not present

## 2018-02-19 DIAGNOSIS — K3184 Gastroparesis: Secondary | ICD-10-CM | POA: Diagnosis not present

## 2018-02-19 DIAGNOSIS — I2510 Atherosclerotic heart disease of native coronary artery without angina pectoris: Secondary | ICD-10-CM | POA: Insufficient documentation

## 2018-02-19 DIAGNOSIS — Z87891 Personal history of nicotine dependence: Secondary | ICD-10-CM | POA: Insufficient documentation

## 2018-02-19 DIAGNOSIS — M1990 Unspecified osteoarthritis, unspecified site: Secondary | ICD-10-CM | POA: Insufficient documentation

## 2018-02-19 DIAGNOSIS — D123 Benign neoplasm of transverse colon: Secondary | ICD-10-CM | POA: Diagnosis not present

## 2018-02-19 DIAGNOSIS — G4730 Sleep apnea, unspecified: Secondary | ICD-10-CM | POA: Insufficient documentation

## 2018-02-19 DIAGNOSIS — Z1211 Encounter for screening for malignant neoplasm of colon: Secondary | ICD-10-CM | POA: Diagnosis not present

## 2018-02-19 DIAGNOSIS — K644 Residual hemorrhoidal skin tags: Secondary | ICD-10-CM | POA: Insufficient documentation

## 2018-02-19 DIAGNOSIS — E7800 Pure hypercholesterolemia, unspecified: Secondary | ICD-10-CM | POA: Diagnosis not present

## 2018-02-19 DIAGNOSIS — I739 Peripheral vascular disease, unspecified: Secondary | ICD-10-CM | POA: Insufficient documentation

## 2018-02-19 DIAGNOSIS — K219 Gastro-esophageal reflux disease without esophagitis: Secondary | ICD-10-CM | POA: Diagnosis not present

## 2018-02-19 DIAGNOSIS — F419 Anxiety disorder, unspecified: Secondary | ICD-10-CM | POA: Insufficient documentation

## 2018-02-19 DIAGNOSIS — I712 Thoracic aortic aneurysm, without rupture: Secondary | ICD-10-CM | POA: Insufficient documentation

## 2018-02-19 DIAGNOSIS — K228 Other specified diseases of esophagus: Secondary | ICD-10-CM | POA: Diagnosis not present

## 2018-02-19 HISTORY — PX: ESOPHAGOGASTRODUODENOSCOPY (EGD) WITH PROPOFOL: SHX5813

## 2018-02-19 HISTORY — PX: POLYPECTOMY: SHX5525

## 2018-02-19 HISTORY — PX: BIOPSY: SHX5522

## 2018-02-19 HISTORY — PX: COLONOSCOPY WITH PROPOFOL: SHX5780

## 2018-02-19 SURGERY — COLONOSCOPY WITH PROPOFOL
Anesthesia: Monitor Anesthesia Care

## 2018-02-19 MED ORDER — DEXAMETHASONE SODIUM PHOSPHATE 4 MG/ML IJ SOLN
INTRAMUSCULAR | Status: DC | PRN
  Administered 2018-02-19: 4 mg via INTRAVENOUS

## 2018-02-19 MED ORDER — LACTATED RINGERS IV SOLN
INTRAVENOUS | Status: DC
  Administered 2018-02-19: 12:00:00 via INTRAVENOUS

## 2018-02-19 MED ORDER — STERILE WATER FOR IRRIGATION IR SOLN
Status: DC | PRN
  Administered 2018-02-19: 1.5 mL

## 2018-02-19 MED ORDER — PROPOFOL 500 MG/50ML IV EMUL
INTRAVENOUS | Status: DC | PRN
  Administered 2018-02-19: 175 ug/kg/min via INTRAVENOUS
  Administered 2018-02-19 (×2): via INTRAVENOUS

## 2018-02-19 MED ORDER — MIDAZOLAM HCL 5 MG/5ML IJ SOLN
INTRAMUSCULAR | Status: DC | PRN
  Administered 2018-02-19: 2 mg via INTRAVENOUS

## 2018-02-19 MED ORDER — CHLORHEXIDINE GLUCONATE CLOTH 2 % EX PADS: 6.0000 | MEDICATED_PAD | Freq: Once | CUTANEOUS | Status: DC

## 2018-02-19 MED ORDER — PROPOFOL 10 MG/ML IV BOLUS
INTRAVENOUS | Status: AC
  Filled 2018-02-19: qty 40

## 2018-02-19 MED ORDER — ONDANSETRON HCL 4 MG/2ML IJ SOLN
INTRAMUSCULAR | Status: DC | PRN
  Administered 2018-02-19: 4 mg via INTRAVENOUS

## 2018-02-19 MED ORDER — DEXAMETHASONE SODIUM PHOSPHATE 4 MG/ML IJ SOLN
INTRAMUSCULAR | Status: AC
  Filled 2018-02-19: qty 1

## 2018-02-19 NOTE — Discharge Instructions (Signed)
No aspirin or NSAIDs for 24 hours. Resume other medications as before. Resume usual diet. No driving for 24 hours. Physician will call with biopsy results next week.   Esophagogastroduodenoscopy, Care After Refer to this sheet in the next few weeks. These instructions provide you with information about caring for yourself after your procedure. Your health care provider may also give you more specific instructions. Your treatment has been planned according to current medical practices, but problems sometimes occur. Call your health care provider if you have any problems or questions after your procedure. What can I expect after the procedure? After the procedure, it is common to have:  A sore throat.  Nausea.  Bloating.  Dizziness.  Fatigue.  Follow these instructions at home:  Do not eat or drink anything until the numbing medicine (local anesthetic) has worn off and your gag reflex has returned. You will know that the local anesthetic has worn off when you can swallow comfortably.  Do not drive for 24 hours if you received a medicine to help you relax (sedative).  If your health care provider took a tissue sample for testing during the procedure, make sure to get your test results. This is your responsibility. Ask your health care provider or the department performing the test when your results will be ready.  Keep all follow-up visits as told by your health care provider. This is important. Contact a health care provider if:  You cannot stop coughing.  You are not urinating.  You are urinating less than usual. Get help right away if:  You have trouble swallowing.  You cannot eat or drink.  You have throat or chest pain that gets worse.  You are dizzy or light-headed.  You faint.  You have nausea or vomiting.  You have chills.  You have a fever.  You have severe abdominal pain.  You have black, tarry, or bloody stools. This information is not intended to  replace advice given to you by your health care provider. Make sure you discuss any questions you have with your health care provider. Document Released: 05/05/2012 Document Revised: 10/25/2015 Document Reviewed: 04/12/2015 Elsevier Interactive Patient Education  2018 [employer].    Colonoscopy, Adult, Care After This sheet gives you information about how to care for yourself after your procedure. Your doctor may also give you more specific instructions. If you have problems or questions, call your doctor. Follow these instructions at home: General instructions   For the first 24 hours after the procedure: ? Do not drive or use machinery. ? Do not sign important documents. ? Do not drink alcohol. ? Do your daily activities more slowly than normal. ? Eat foods that are soft and easy to digest. ? Rest often.  Take over-the-counter or prescription medicines only as told by your doctor.  It is up to you to get the results of your procedure. Ask your doctor, or the department performing the procedure, when your results will be ready. To help cramping and bloating:  Try walking around.  Put heat on your belly (abdomen) as told by your doctor. Use a heat source that your doctor recommends, such as a moist heat pack or a heating pad. ? Put a towel between your skin and the heat source. ? Leave the heat on for 20-30 minutes. ? Remove the heat if your skin turns bright red. This is especially important if you cannot feel pain, heat, or cold. You can get burned. Eating and drinking  Drink enough  fluid to keep your pee (urine) clear or pale yellow.  Return to your normal diet as told by your doctor. Avoid heavy or fried foods that are hard to digest.  Avoid drinking alcohol for as long as told by your doctor. Contact a doctor if:  You have blood in your poop (stool) 2-3 days after the procedure. Get help right away if:  You have more than a small amount of blood in your poop.  You  see large clumps of tissue (blood clots) in your poop.  Your belly is swollen.  You feel sick to your stomach (nauseous).  You throw up (vomit).  You have a fever.  You have belly pain that gets worse, and medicine does not help your pain. This information is not intended to replace advice given to you by your health care provider. Make sure you discuss any questions you have with your health care provider. Document Released: 06/21/2010 Document Revised: 02/11/2016 Document Reviewed: 02/11/2016 Elsevier Interactive Patient Education  2017 [place], Care After These instructions provide you with information about caring for yourself after your procedure. Your health care provider may also give you more specific instructions. Your treatment has been planned according to current medical practices, but problems sometimes occur. Call your health care provider if you have any problems or questions after your procedure. What can I expect after the procedure? After your procedure, it is common to:  Feel sleepy for several hours.  Feel clumsy and have poor balance for several hours.  Feel forgetful about what happened after the procedure.  Have poor judgment for several hours.  Feel nauseous or vomit.  Have a sore throat if you had a breathing tube during the procedure.  Follow these instructions at home: For at least 24 hours after the procedure:   Do not: ? Participate in activities in which you could fall or become injured. ? Drive. ? Use heavy machinery. ? Drink alcohol. ? Take sleeping pills or medicines that cause drowsiness. ? Make important decisions or sign legal documents. ? Take care of children on your own.  Rest. Eating and drinking  Follow the diet that is recommended by your health care provider.  If you vomit, drink water, juice, or soup when you can drink without vomiting.  Make sure you have little or no nausea before  eating solid foods. General instructions  Have a responsible adult stay with you until you are awake and alert.  Take over-the-counter and prescription medicines only as told by your health care provider.  If you smoke, do not smoke without supervision.  Keep all follow-up visits as told by your health care provider. This is important. Contact a health care provider if:  You keep feeling nauseous or you keep vomiting.  You feel light-headed.  You develop a rash.  You have a fever. Get help right away if:  You have trouble breathing. This information is not intended to replace advice given to you by your health care provider. Make sure you discuss any questions you have with your health care provider. Document Released: 09/09/2015 Document Revised: 01/09/2016 Document Reviewed: 09/09/2015 Elsevier Interactive Patient Education  2018 [place].     Hiatal Hernia A hiatal hernia occurs when part of the stomach slides above the muscle that separates the abdomen from the chest (diaphragm). A person can be born with a hiatal hernia (congenital), or it may develop over time. In almost all cases of hiatal  hernia, only the top part of the stomach pushes through the diaphragm. Many people have a hiatal hernia with no symptoms. The larger the hernia, the more likely it is that you will have symptoms. In some cases, a hiatal hernia allows stomach acid to flow back into the tube that carries food from your mouth to your stomach (esophagus). This may cause heartburn symptoms. Severe heartburn symptoms may mean that you have developed a condition called gastroesophageal reflux disease (GERD). What are the causes? This condition is caused by a weakness in the opening (hiatus) where the esophagus passes through the diaphragm to attach to the upper part of the stomach. A person may be born with a weakness in the hiatus, or a weakness can develop over time. What increases the risk? This condition  is more likely to develop in:  Older people. Age is a major risk factor for a hiatal hernia, especially if you are over the age of 66.  Pregnant women.  People who are overweight.  People who have frequent constipation.  What are the signs or symptoms? Symptoms of this condition usually develop in the form of GERD symptoms. Symptoms include:  Heartburn.  Belching.  Indigestion.  Trouble swallowing.  Coughing or wheezing.  Sore throat.  Hoarseness.  Chest pain.  Nausea and vomiting.  How is this diagnosed? This condition may be diagnosed during testing for GERD. Tests that may be done include:  X-rays of your stomach or chest.  An upper gastrointestinal (GI) series. This is an X-ray exam of your GI tract that is taken after you swallow a chalky liquid that shows up clearly on the X-ray.  Endoscopy. This is a procedure to look into your stomach using a thin, flexible tube that has a tiny camera and light on the end of it.  How is this treated? This condition may be treated by:  Dietary and lifestyle changes to help reduce GERD symptoms.  Medicines. These may include: ? Over-the-counter antacids. ? Medicines that make your stomach empty more quickly. ? Medicines that block the production of stomach acid (H2 blockers). ? Stronger medicines to reduce stomach acid (proton pump inhibitors).  Surgery to repair the hernia, if other treatments are not helping.  If you have no symptoms, you may not need treatment. Follow these instructions at home: Lifestyle and activity  Do not use any products that contain nicotine or tobacco, such as cigarettes and e-cigarettes. If you need help quitting, ask your health care provider.  Try to achieve and maintain a healthy body weight.  Avoid putting pressure on your abdomen. Anything that puts pressure on your abdomen increases the amount of acid that may be pushed up into your esophagus. ? Avoid bending over, especially after  eating. ? Raise the head of your bed by putting blocks under the legs. This keeps your head and esophagus higher than your stomach. ? Do not wear tight clothing around your chest or stomach. ? Try not to strain when having a bowel movement, when urinating, or when lifting heavy objects. Eating and drinking  Avoid foods that can worsen GERD symptoms. These may include: ? Fatty foods, like fried foods. ? Citrus fruits, like oranges or lemon. ? Other foods and drinks that contain acid, like orange juice or tomatoes. ? Spicy food. ? Chocolate.  Eat frequent small meals instead of three large meals a day. This helps prevent your stomach from getting too full. ? Eat slowly. ? Do not lie down right after eating. ?  Do not eat 1-2 hours before bed.  Do not drink beverages with caffeine. These include cola, coffee, cocoa, and tea.  Do not drink alcohol. General instructions  Take over-the-counter and prescription medicines only as told by your health care provider.  Keep all follow-up visits as told by your health care provider. This is important. Contact a health care provider if:  Your symptoms are not controlled with medicines or lifestyle changes.  You are having trouble swallowing.  You have coughing or wheezing that will not go away. Get help right away if:  Your pain is getting worse.  Your pain spreads to your arms, neck, jaw, teeth, or back.  You have shortness of breath.  You sweat for no reason.  You feel sick to your stomach (nauseous) or you vomit.  You vomit blood.  You have bright red blood in your stools.  You have black, tarry stools. This information is not intended to replace advice given to you by your health care provider. Make sure you discuss any questions you have with your health care provider. Document Released: 08/09/2003 Document Revised: 05/12/2016 Document Reviewed: 05/12/2016 Elsevier Interactive Patient Education  2018 [employer].    Diverticulosis Diverticulosis is a condition that develops when small pouches (diverticula) form in the wall of the large intestine (colon). The colon is where water is absorbed and stool is formed. The pouches form when the inside layer of the colon pushes through weak spots in the outer layers of the colon. You may have a few pouches or many of them. What are the causes? The cause of this condition is not known. What increases the risk? The following factors may make you more likely to develop this condition:  Being older than age 47. Your risk for this condition increases with age. Diverticulosis is rare among people younger than age 30. By age 85, many people have it.  Eating a low-fiber diet.  Having frequent constipation.  Being overweight.  Not getting enough exercise.  Smoking.  Taking over-the-counter pain medicines, like aspirin and ibuprofen.  Having a family history of diverticulosis.  What are the signs or symptoms? In most people, there are no symptoms of this condition. If you do have symptoms, they may include:  Bloating.  Cramps in the abdomen.  Constipation or diarrhea.  Pain in the lower left side of the abdomen.  How is this diagnosed? This condition is most often diagnosed during an exam for other colon problems. Because diverticulosis usually has no symptoms, it often cannot be diagnosed independently. This condition may be diagnosed by:  Using a flexible scope to examine the colon (colonoscopy).  Taking an X-ray of the colon after dye has been put into the colon (barium enema).  Doing a CT scan.  How is this treated? You may not need treatment for this condition if you have never developed an infection related to diverticulosis. If you have had an infection before, treatment may include:  Eating a high-fiber diet. This may include eating more fruits, vegetables, and grains.  Taking a fiber supplement.  Taking a live bacteria  supplement (probiotic).  Taking medicine to relax your colon.  Taking antibiotic medicines.  Follow these instructions at home:  Drink 6-8 glasses of water or more each day to prevent constipation.  Try not to strain when you have a bowel movement.  If you have had an infection before: ? Eat more fiber as directed by your health care provider or your diet and nutrition  specialist (dietitian). ? Take a fiber supplement or probiotic, if your health care provider approves.  Take over-the-counter and prescription medicines only as told by your health care provider.  If you were prescribed an antibiotic, take it as told by your health care provider. Do not stop taking the antibiotic even if you start to feel better.  Keep all follow-up visits as told by your health care provider. This is important. Contact a health care provider if:  You have pain in your abdomen.  You have bloating.  You have cramps.  You have not had a bowel movement in 3 days. Get help right away if:  Your pain gets worse.  Your bloating becomes very bad.  You have a fever or chills, and your symptoms suddenly get worse.  You vomit.  You have bowel movements that are bloody or black.  You have bleeding from your rectum. Summary  Diverticulosis is a condition that develops when small pouches (diverticula) form in the wall of the large intestine (colon).  You may have a few pouches or many of them.  This condition is most often diagnosed during an exam for other colon problems.  If you have had an infection related to diverticulosis, treatment may include increasing the fiber in your diet, taking supplements, or taking medicines. This information is not intended to replace advice given to you by your health care provider. Make sure you discuss any questions you have with your health care provider. Document Released: 02/14/2004 Document Revised: 04/07/2016 Document Reviewed: 04/07/2016 Elsevier  Interactive Patient Education  2017 [employer].

## 2018-02-19 NOTE — H&P (Signed)
[name] is an 67 y.o. male.   Chief Complaint: Patient is here for EGD and colonoscopy.  HPI: Patient is 67 year old Caucasian male with multiple medical problems who also has chronic GERD with short segment Barrett's esophagus as well as gastroparesis.  He states he is doing well with PPI and promotility agent.  He denies dysphagia nausea or vomiting.  He has heartburn with certain foods.  He denies melena.  He is also undergoing screening colonoscopy.  He has a history of IBS but lately his bowels been regular.  He denies rectal bleeding. Family history is negative for CRC.  Past Medical History:  Diagnosis Date  . Anxiety    patient denies  . Aortic aneurysm (HCC)    ascending  . Arthritis   . Barrett esophagus   . Blood transfusion   . Cancer ([place]) 2007   melanoma stage 3 stomach bx  . Coronary artery disease    mild by 09/08/13 cath HPR  . GERD (gastroesophageal reflux disease)    barretts esophagus  . H/O hiatal hernia   . Headache(784.0)   . Hypercholesteremia   . Hypertension   . PONV (postoperative nausea and vomiting)    trouble breathing after last surgery (at [facility])  . Shingles    10  . Sleep apnea    not tested  . 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 ABDOMINAL SURGERY     x3  . back surgeries     x 9  . BACK SURGERY     x8  . BIOPSY N/A 03/28/2013   Procedure: Distal Esophageal Biopsy;  Surgeon: Rogene Houston, MD;  Location: [facility];  Service: Endoscopy;  Laterality: N/A;  . BIOPSY  05/30/2016   Procedure: BIOPSY;  Surgeon: Rogene Houston, MD;  Location: [facility];  Service: Endoscopy;;  . CHEST SURGERY     lung incisional hernia for hernia  . ESOPHAGOGASTRODUODENOSCOPY (EGD) WITH PROPOFOL N/A 03/28/2013   Procedure: ESOPHAGOGASTRODUODENOSCOPY (EGD) WITH PROPOFOL;  Surgeon: Rogene Houston, MD;  Location: [facility];  Service: Endoscopy;  Laterality: N/A;  GE junction @ 38, proximal  margin @37   .  ESOPHAGOGASTRODUODENOSCOPY (EGD) WITH PROPOFOL N/A 05/30/2016   Procedure: ESOPHAGOGASTRODUODENOSCOPY (EGD) WITH PROPOFOL;  Surgeon: Rogene Houston, MD;  Location: [facility];  Service: Endoscopy;  Laterality: N/A;  955  . HEMORRHOID SURGERY     x2  . HERNIA REPAIR    . HIATAL HERNIA REPAIR    . JOINT REPLACEMENT     lft knee  . KNEE ARTHROPLASTY     lft  . KNEE ARTHROSCOPY Right   . LEG SURGERY     x4 crushed   . LITHOTRIPSY    . MELANOMA EXCISION     lft abdomen  . NECK SURGERY  2012  . POSTERIOR CERVICAL FUSION/FORAMINOTOMY  08/18/2011   Procedure: POSTERIOR CERVICAL FUSION/FORAMINOTOMY LEVEL 3;  Surgeon: Ophelia Charter, MD;  Location: [facility];  Service: Neurosurgery;  Laterality: N/A;  Cervical three-four, four-five, five-six posterior cervical  fusion with instrumentation; Right cervical three-four, four-five laminotomy  . REVISION TOTAL HIP ARTHROPLASTY Left   . SHOULDER ARTHROSCOPY     rt bith defect , spur, rotator cuff  . SPINAL CORD STIMULATOR IMPLANT     rt hip  . SPINAL CORD STIMULATOR INSERTION N/A 11/03/2016   Procedure: LUMBAR SPINAL CORD STIMULATOR REVISION;  Surgeon: Newman Pies, MD;  Location:  [facility];  Service: Neurosurgery;  Laterality: N/A;  . THYROID LOBECTOMY     rt    Family History  Problem Relation Age of Onset  . Heart disease Father   . Heart disease Paternal Grandmother   . Heart disease Paternal Grandfather   . Heart disease Maternal Grandfather   . Heart disease Brother        heart transplant  . Aneurysm Unknown        pt states several members have aortic aneurysm  . Skin cancer Unknown        several members  . Lymphoma Brother    Social History:  reports that he quit smoking about 37 years ago. His smoking use included cigarettes. He has a 40.00 pack-year smoking history. He has never used smokeless tobacco. He reports that he drinks alcohol. He reports that he does not use drugs.  Allergies:  Allergies  Allergen Reactions   . Demerol Other (See Comments)    "Makes me crazy."    Medications Prior to Admission  Medication Sig Dispense Refill  . Aspirin-Acetaminophen-Caffeine (GOODY HEADACHE PO) Take 1-2 packets by mouth daily as needed (headaches).    Marland Kitchen Bioflavonoid Products (ESTER C PO) Take 1 tablet by mouth daily.    . calcium-vitamin D (OSCAL WITH D) 500-200 MG-UNIT per tablet Take 1 tablet by mouth 2 (two) times daily.     . cyclobenzaprine (FLEXERIL) 10 MG tablet Take 1 tablet (10 mg total) by mouth 3 (three) times daily as needed for muscle spasms. 50 tablet 1  . dexlansoprazole (DEXILANT) 60 MG capsule Take 60 mg by mouth daily.    . diazepam (VALIUM) 10 MG tablet Take 10 mg by mouth 4 (four) times daily as needed (for back pain).     . fexofenadine (ALLEGRA) 180 MG tablet Take 180 mg by mouth every evening.    . fish oil-omega-3 fatty acids 1000 MG capsule Take 1 g by mouth 2 (two) times daily.    Marland Kitchen lisinopril (PRINIVIL,ZESTRIL) 10 MG tablet Take 10 mg by mouth daily.    . montelukast (SINGULAIR) 10 MG tablet Take 10 mg by mouth daily.     . Multiple Vitamin (MULITIVITAMIN WITH MINERALS) TABS Take 1 tablet by mouth daily.    . ondansetron (ZOFRAN) 4 MG tablet Take 4 mg by mouth every 8 (eight) hours as needed for nausea or vomiting.    Marland Kitchen oxyCODONE-acetaminophen (PERCOCET) 10-325 MG tablet Take 1 tablet by mouth every 6 (six) hours as needed for pain.    . pregabalin (LYRICA) 150 MG capsule Take 150 mg by mouth 3 (three) times daily.    Marland Kitchen PRESCRIPTION MEDICATION Take 10 mg by mouth 2 (two) times daily. Domperidone 10 mg from [place]    . rosuvastatin (CRESTOR) 10 MG tablet Take 10 mg by mouth every evening.       No results found for this or any previous visit (from the past 48 hour(s)). No results found.  ROS  Blood pressure (!) 164/91, pulse 69, temperature 97.8 F (36.6 C), temperature source Oral, SpO2 96 %. Physical Exam  Constitutional: He appears well-developed.  HENT:  Mouth/Throat:  Oropharynx is clear and moist.  Eyes: Conjunctivae are normal. No scleral icterus.  Neck: No thyromegaly present.  Cardiovascular: Normal rate, regular rhythm and normal heart sounds.  No murmur heard. Respiratory: Effort normal and breath sounds normal.  GI:  Abdomen is full with long upper midline scar.  Abdomen is soft and nontender with organomegaly  or masses.  Musculoskeletal: He exhibits no edema.  Lymphadenopathy:    He has no cervical adenopathy.  Neurological: He is alert.  Skin: Skin is warm and dry.     Assessment/Plan Chronic GERD complicated by Barrett's esophagus. Surveillance EGD and average risk screening colonoscopy.  Hildred Laser, MD 02/19/2018, 12:31 PM

## 2018-02-19 NOTE — Op Note (Signed)
[facility] Patient Name: [name] Procedure Date: 02/19/2018 1:02 PM MRN: [id number] Date of Birth: 1951/02/08 Attending MD: Hildred Laser , MD CSN: [id number] Age: 67 Admit Type: Outpatient Procedure:                Colonoscopy Indications:              Screening for colorectal malignant neoplasm Providers:                Hildred Laser, MD, Charlsie Quest. Theda Sers RN, RN,                            Randa Spike, Technician Referring MD:             [employer], MD Medicines:                Propofol per Anesthesia Complications:            No immediate complications. Estimated Blood Loss:     Estimated blood loss was minimal. Procedure:                Pre-Anesthesia Assessment:                           - Prior to the procedure, a History and Physical                            was performed, and patient medications and                            allergies were reviewed. The patient's tolerance of                            previous anesthesia was also reviewed. The risks                            and benefits of the procedure and the sedation                            options and risks were discussed with the patient.                            All questions were answered, and informed consent                            was obtained. Prior Anticoagulants: The patient has                            taken no previous anticoagulant or antiplatelet                            agents. ASA Grade Assessment: III - A patient with                            severe systemic disease. After reviewing the risks  and benefits, the patient was deemed in                            satisfactory condition to undergo the procedure.                           After obtaining informed consent, the colonoscope                            was passed under direct vision. Throughout the                            procedure, the patient's blood pressure, pulse, and                    oxygen saturations were monitored continuously. The                            [id number]-H190DL ([id number]) scope was introduced through                            the anus and advanced to the the cecum, identified                            by appendiceal orifice and ileocecal valve. The                            colonoscopy was performed without difficulty. The                            patient tolerated the procedure well. The quality                            of the bowel preparation was good. The ileocecal                            valve, appendiceal orifice, and rectum were                            photographed. Scope In: 1:06:23 PM Scope Out: 1:32:06 PM Scope Withdrawal Time: 0 hours 15 minutes 26 seconds  Total Procedure Duration: 0 hours 25 minutes 43 seconds  Findings:      The perianal and digital rectal examinations were normal.      Four sessile polyps were found in the descending colon and transverse       colon. The polyps were small in size. These were biopsied with a cold       forceps for histology. The pathology specimen was placed into Bottle       Number 2.      Scattered diverticula were found in the entire colon.      External hemorrhoids were found during retroflexion. The hemorrhoids       were small. Impression:               - Four small polyps in the descending colon and in  the transverse colon. Biopsied.                           - Diverticulosis in the entire examined colon.                           - External hemorrhoids. Moderate Sedation:      Per Anesthesia Care Recommendation:           - Patient has a contact number available for                            emergencies. The signs and symptoms of potential                            delayed complications were discussed with the                            patient. Return to normal activities tomorrow.                            Written discharge instructions  were provided to the                            patient.                           - Resume previous diet today.                           - Continue present medications.                           - No aspirin, ibuprofen, naproxen, or other                            non-steroidal anti-inflammatory drugs for 1 day.                           - Await pathology results.                           - Repeat colonoscopy is recommended. The                            colonoscopy date will be determined after pathology                            results from today's exam become available for                            review. Procedure Code(s):        --- Professional ---                           [phone], Colonoscopy, flexible; with biopsy, single  or multiple Diagnosis Code(s):        --- Professional ---                           Z12.11, Encounter for screening for malignant                            neoplasm of colon                           D12.4, Benign neoplasm of descending colon                           D12.3, Benign neoplasm of transverse colon (hepatic                            flexure or splenic flexure)                           K64.4, Residual hemorrhoidal skin tags                           K57.30, Diverticulosis of large intestine without                            perforation or abscess without bleeding CPT copyright 2017 American Medical Association. All rights reserved. The codes documented in this report are preliminary and upon coder review may  be revised to meet current compliance requirements. Hildred Laser, MD Hildred Laser, MD 02/19/2018 1:46:41 PM This report has been signed electronically. Number of Addenda: 0

## 2018-02-19 NOTE — Anesthesia Postprocedure Evaluation (Signed)
Anesthesia Post Note  Patient: [name]  Procedure(s) Performed: COLONOSCOPY WITH PROPOFOL (N/A ) ESOPHAGOGASTRODUODENOSCOPY (EGD) WITH PROPOFOL (N/A ) BIOPSY POLYPECTOMY  Patient location during evaluation: PACU Anesthesia Type: MAC Level of consciousness: awake Pain management: pain level controlled Vital Signs Assessment: post-procedure vital signs reviewed and stable Respiratory status: spontaneous breathing, nonlabored ventilation and respiratory function stable Cardiovascular status: blood pressure returned to baseline Postop Assessment: no apparent nausea or vomiting Anesthetic complications: no     Last Vitals:  Vitals:   02/19/18 1206 02/19/18 1345  BP: (!) 164/91 113/69  Pulse:  (P) 62  Temp:  (P) 36.5 C  SpO2:      Last Pain:  Vitals:   02/19/18 1245  TempSrc:   PainSc: 4                  Tarika Mckethan J

## 2018-02-19 NOTE — Op Note (Signed)
[facility] Patient Name: [name] Procedure Date: 02/19/2018 11:57 AM MRN: [id number] Date of Birth: 05-May-1951 Attending MD: Hildred Laser , MD CSN: [id number] Age: 68 Admit Type: Outpatient Procedure:                Upper GI endoscopy Indications:              Follow-up of Barrett's esophagus Providers:                Hildred Laser, MD, Charlsie Quest. Theda Sers RN, RN,                            Randa Spike, Technician Referring MD:             [employer], MD Medicines:                Propofol per Anesthesia Complications:            No immediate complications. Estimated Blood Loss:     Estimated blood loss was minimal. Procedure:                Pre-Anesthesia Assessment:                           - Prior to the procedure, a History and Physical                            was performed, and patient medications and                            allergies were reviewed. The patient's tolerance of                            previous anesthesia was also reviewed. The risks                            and benefits of the procedure and the sedation                            options and risks were discussed with the patient.                            All questions were answered, and informed consent                            was obtained. Prior Anticoagulants: The patient has                            taken no previous anticoagulant or antiplatelet                            agents. ASA Grade Assessment: III - A patient with                            severe systemic disease. After reviewing the risks  and benefits, the patient was deemed in                            satisfactory condition to undergo the procedure.                           After obtaining informed consent, the endoscope was                            passed under direct vision. Throughout the                            procedure, the patient's blood pressure, pulse, and               oxygen saturations were monitored continuously. The                            GIF-H190 ([id number]) scope was introduced through the                            and advanced to the second part of duodenum. The                            upper GI endoscopy was accomplished without                            difficulty. The patient tolerated the procedure                            well. Scope In: 12:50:07 PM Scope Out: 12:59:50 PM Total Procedure Duration: 0 hours 9 minutes 43 seconds  Findings:      The proximal esophagus and mid esophagus were normal.      There were esophageal mucosal changes consistent with short-segment       Barrett's esophagus present in the distal esophagus. The maximum       longitudinal extent of these mucosal changes was 1 cm in length. This       was biopsied with a cold forceps for histology. The pathology specimen       was placed into Bottle Number 1.      The Z-line was irregular and was found 35 cm from the incisors.      A 4 cm hiatal hernia was present.      The entire examined stomach was normal.      The duodenal bulb and second portion of the duodenum were normal. Impression:               - Normal proximal esophagus and mid esophagus.                           - Esophageal mucosal changes consistent with                            short-segment Barrett's esophagus. Biopsied.                           - Z-line  irregular, 35 cm from the incisors.                           - 4 cm hiatal hernia.                           - Normal stomach.                           - Normal duodenal bulb and second portion of the                            duodenum. Moderate Sedation:      Per Anesthesia Care Recommendation:           - Patient has a contact number available for                            emergencies. The signs and symptoms of potential                            delayed complications were discussed with the                             patient. Return to normal activities tomorrow.                            Written discharge instructions were provided to the                            patient.                           - Resume previous diet today.                           - Continue present medications.                           - No aspirin, ibuprofen, naproxen, or other                            non-steroidal anti-inflammatory drugs for 1 day.                           - See the other procedure note for documentation of                            additional recommendations. Procedure Code(s):        --- Professional ---                           [phone], Esophagogastroduodenoscopy, flexible,                            transoral; with biopsy, single or multiple Diagnosis Code(s):        --- Professional ---  K22.70, Barrett's esophagus without dysplasia                           K22.8, Other specified diseases of esophagus                           K44.9, Diaphragmatic hernia without obstruction or                            gangrene CPT copyright 2017 American Medical Association. All rights reserved. The codes documented in this report are preliminary and upon coder review may  be revised to meet current compliance requirements. Hildred Laser, MD Hildred Laser, MD 02/19/2018 1:42:46 PM This report has been signed electronically. Number of Addenda: 0

## 2018-02-19 NOTE — Transfer of Care (Signed)
Immediate Anesthesia Transfer of Care Note  Patient: [name]  Procedure(s) Performed: COLONOSCOPY WITH PROPOFOL (N/A ) ESOPHAGOGASTRODUODENOSCOPY (EGD) WITH PROPOFOL (N/A ) BIOPSY POLYPECTOMY  Patient Location: PACU  Anesthesia Type:MAC  Level of Consciousness: awake and patient cooperative  Airway & Oxygen Therapy: Patient Spontanous Breathing  Post-op Assessment: Report given to RN, Post -op Vital signs reviewed and stable and Patient moving all extremities  Post vital signs: Reviewed and stable  Last Vitals:  Vitals Value Taken Time  BP    Temp    Pulse 77 02/19/2018  1:47 PM  Resp    SpO2 95 % 02/19/2018  1:47 PM  Vitals shown include unvalidated device data.  Last Pain:  Vitals:   02/19/18 1245  TempSrc:   PainSc: 4          Complications: No apparent anesthesia complications

## 2018-02-19 NOTE — Anesthesia Preprocedure Evaluation (Signed)
Anesthesia Evaluation  Patient identified by MRN, date of birth, ID band Patient awake    Reviewed: Allergy & Precautions, H&P , NPO status , Patient's Chart, lab work & pertinent test results, reviewed documented beta blocker date and time   History of Anesthesia Complications (+) PONV and history of anesthetic complications  Airway Mallampati: II  TM Distance: >3 FB Neck ROM: full    Dental no notable dental hx.    Pulmonary neg pulmonary ROS, sleep apnea , former smoker,    Pulmonary exam normal breath sounds clear to auscultation       Cardiovascular Exercise Tolerance: Good hypertension, + CAD and + Peripheral Vascular Disease  negative cardio ROS   Rhythm:regular Rate:Normal     Neuro/Psych  Headaches, Anxiety negative neurological ROS  negative psych ROS   GI/Hepatic negative GI ROS, Neg liver ROS, hiatal hernia, GERD  ,  Endo/Other  negative endocrine ROS  Renal/GU negative Renal ROS  negative genitourinary   Musculoskeletal   Abdominal   Peds  Hematology negative hematology ROS (+)   Anesthesia Other Findings   Reproductive/Obstetrics negative OB ROS                             Anesthesia Physical Anesthesia Plan  ASA: III  Anesthesia Plan: MAC   Post-op Pain Management:    Induction:   PONV Risk Score and Plan:   Airway Management Planned:   Additional Equipment:   Intra-op Plan:   Post-operative Plan:   Informed Consent: I have reviewed the patients History and Physical, chart, labs and discussed the procedure including the risks, benefits and alternatives for the proposed anesthesia with the patient or authorized representative who has indicated his/her understanding and acceptance.   Dental Advisory Given  Plan Discussed with: CRNA  Anesthesia Plan Comments:         Anesthesia Quick Evaluation

## 2018-02-24 ENCOUNTER — Encounter (HOSPITAL_COMMUNITY): Payer: Self-pay | Admitting: Internal Medicine

## 2018-03-03 ENCOUNTER — Telehealth (INDEPENDENT_AMBULATORY_CARE_PROVIDER_SITE_OTHER): Payer: Self-pay | Admitting: *Deleted

## 2018-03-03 NOTE — Telephone Encounter (Signed)
Patient called and left a message asking that I call him. I called the home number and a messages was left got him to call.  A message was left asking that he call the office back.

## 2018-03-04 ENCOUNTER — Telehealth (INDEPENDENT_AMBULATORY_CARE_PROVIDER_SITE_OTHER): Payer: Self-pay | Admitting: Internal Medicine

## 2018-03-04 NOTE — Telephone Encounter (Signed)
Patient stated he would like you to call him back at [phone] - you were to get with Dr Laural Golden

## 2018-03-05 NOTE — Telephone Encounter (Signed)
Per Dr.Rehman - the patient that they are wanting [place] to see , we will need a referral from the [facility] [place]. To include all the patient records, Imaging , Labs , ect. Once this is rec'd a decision will be made as to rather or not it would be beneficial for the patient to be seen at [place].  [name] states that he will have them to sent this requested information to [place].Marland Kitchen

## 2018-05-31 DIAGNOSIS — J209 Acute bronchitis, unspecified: Secondary | ICD-10-CM | POA: Diagnosis not present

## 2018-06-02 HISTORY — PX: CORONARY ANGIOPLASTY WITH STENT PLACEMENT: SHX49

## 2018-06-02 HISTORY — PX: CARDIAC CATHETERIZATION: SHX172

## 2018-06-07 DIAGNOSIS — H6993 Unspecified Eustachian tube disorder, bilateral: Secondary | ICD-10-CM | POA: Diagnosis not present

## 2018-06-07 DIAGNOSIS — J019 Acute sinusitis, unspecified: Secondary | ICD-10-CM | POA: Diagnosis not present

## 2018-06-07 DIAGNOSIS — Z1389 Encounter for screening for other disorder: Secondary | ICD-10-CM | POA: Diagnosis not present

## 2018-07-14 DIAGNOSIS — J301 Allergic rhinitis due to pollen: Secondary | ICD-10-CM | POA: Diagnosis not present

## 2018-07-14 DIAGNOSIS — J019 Acute sinusitis, unspecified: Secondary | ICD-10-CM | POA: Diagnosis not present

## 2018-07-27 DIAGNOSIS — I77810 Thoracic aortic ectasia: Secondary | ICD-10-CM | POA: Diagnosis not present

## 2018-07-27 DIAGNOSIS — Z87891 Personal history of nicotine dependence: Secondary | ICD-10-CM | POA: Diagnosis not present

## 2018-07-27 DIAGNOSIS — I2510 Atherosclerotic heart disease of native coronary artery without angina pectoris: Secondary | ICD-10-CM | POA: Diagnosis not present

## 2018-07-27 DIAGNOSIS — E7849 Other hyperlipidemia: Secondary | ICD-10-CM | POA: Diagnosis not present

## 2018-07-27 DIAGNOSIS — E785 Hyperlipidemia, unspecified: Secondary | ICD-10-CM | POA: Diagnosis not present

## 2018-07-27 DIAGNOSIS — I10 Essential (primary) hypertension: Secondary | ICD-10-CM | POA: Diagnosis not present

## 2018-07-27 DIAGNOSIS — R0789 Other chest pain: Secondary | ICD-10-CM | POA: Diagnosis not present

## 2018-08-03 DIAGNOSIS — G4733 Obstructive sleep apnea (adult) (pediatric): Secondary | ICD-10-CM | POA: Diagnosis not present

## 2018-08-06 DIAGNOSIS — I712 Thoracic aortic aneurysm, without rupture: Secondary | ICD-10-CM | POA: Diagnosis not present

## 2018-08-06 DIAGNOSIS — I7789 Other specified disorders of arteries and arterioles: Secondary | ICD-10-CM | POA: Diagnosis not present

## 2018-08-25 DIAGNOSIS — S20211A Contusion of right front wall of thorax, initial encounter: Secondary | ICD-10-CM | POA: Diagnosis not present

## 2018-08-25 DIAGNOSIS — S299XXA Unspecified injury of thorax, initial encounter: Secondary | ICD-10-CM | POA: Diagnosis not present

## 2018-09-27 ENCOUNTER — Emergency Department (HOSPITAL_COMMUNITY): Payer: Medicare Other

## 2018-09-27 ENCOUNTER — Emergency Department (HOSPITAL_COMMUNITY)
Admission: EM | Admit: 2018-09-27 | Discharge: 2018-09-27 | Disposition: A | Discharge status: Home / Self Care | Payer: Medicare Other | Attending: Emergency Medicine | Admitting: Emergency Medicine

## 2018-09-27 ENCOUNTER — Encounter (HOSPITAL_COMMUNITY): Payer: Self-pay | Admitting: Emergency Medicine

## 2018-09-27 ENCOUNTER — Other Ambulatory Visit: Payer: Self-pay

## 2018-09-27 DIAGNOSIS — M542 Cervicalgia: Secondary | ICD-10-CM | POA: Diagnosis not present

## 2018-09-27 DIAGNOSIS — Z96642 Presence of left artificial hip joint: Secondary | ICD-10-CM | POA: Insufficient documentation

## 2018-09-27 DIAGNOSIS — Z79899 Other long term (current) drug therapy: Secondary | ICD-10-CM | POA: Insufficient documentation

## 2018-09-27 DIAGNOSIS — Z87891 Personal history of nicotine dependence: Secondary | ICD-10-CM | POA: Insufficient documentation

## 2018-09-27 DIAGNOSIS — Z85828 Personal history of other malignant neoplasm of skin: Secondary | ICD-10-CM | POA: Diagnosis not present

## 2018-09-27 DIAGNOSIS — I2510 Atherosclerotic heart disease of native coronary artery without angina pectoris: Secondary | ICD-10-CM | POA: Insufficient documentation

## 2018-09-27 DIAGNOSIS — I10 Essential (primary) hypertension: Secondary | ICD-10-CM | POA: Diagnosis not present

## 2018-09-27 MED ORDER — HYDROMORPHONE HCL 1 MG/ML IJ SOLN
0.5000 mg | Freq: Once | INTRAMUSCULAR | Status: AC
  Administered 2018-09-27: 0.5 mg via INTRAVENOUS
  Filled 2018-09-27: qty 1

## 2018-09-27 MED ORDER — DEXAMETHASONE SODIUM PHOSPHATE 10 MG/ML IJ SOLN
10.0000 mg | Freq: Once | INTRAMUSCULAR | Status: AC
  Administered 2018-09-27: 10 mg via INTRAVENOUS
  Filled 2018-09-27: qty 1

## 2018-09-27 MED ORDER — METHOCARBAMOL 1000 MG/10ML IJ SOLN
500.0000 mg | Freq: Once | INTRAVENOUS | Status: AC
  Administered 2018-09-27: 08:00:00 500 mg via INTRAVENOUS
  Filled 2018-09-27: qty 5

## 2018-09-27 MED ORDER — PREDNISONE 10 MG (21) PO TBPK: ORAL_TABLET | Freq: Every day | ORAL | 0 refills | Status: DC

## 2018-09-27 MED ORDER — ONDANSETRON HCL 4 MG/2ML IJ SOLN
4.0000 mg | Freq: Once | INTRAMUSCULAR | Status: AC
  Administered 2018-09-27: 4 mg via INTRAVENOUS
  Filled 2018-09-27: qty 2

## 2018-09-27 MED ORDER — ONDANSETRON HCL 4 MG PO TABS: 4.0000 mg | ORAL_TABLET | Freq: Three times a day (TID) | ORAL | 0 refills | Status: DC | PRN

## 2018-09-27 NOTE — ED Notes (Addendum)
ED Provider at bedside. 

## 2018-09-27 NOTE — ED Notes (Signed)
Pt given discharge instructions . Pt verbalizes understanding.

## 2018-09-27 NOTE — ED Triage Notes (Signed)
Pt c/o sudden onset of posterior neck pain, radiating to both sides and up to base of skull, about 3 days ago. Hx of multiple surgeries to neck, back. Reports fall ~2wks ago involving strike to head, neck. Never evaluated for it. Denies new numbness, tingling in extremities or loss of bowel, bladder control.

## 2018-09-27 NOTE — ED Provider Notes (Signed)
Care assumed from [facility], PA-C at shift change. See her note for full H&P.  Per her note, "68 y/o male with hx of ascending aortic aneurysm, CAD, GERD, HTN, HLD presents to the ED for 3 days of constant neck pain. Pain is sharp radiates between the base of his skull down to his lower neck. It is aggravated with movement and has remained constant. He has been taking his home Flexeril, Valium, Lyrica without relief. Has 10/325 Percocet, but has not used this due to side effects of nausea.  Had a mechanical fall ~4-5 weeks ago, but does not recall striking his head or neck with this.  Denies associated vision changes/loss, chest pain, new or worsening SOB, bowel/bladder incontinence, new/worsening numbness or paresthesias, extremity weakness since onset of symptoms.  Hx of multiple prior neck and back surgeries by Dr. Arnoldo Morale. "   Physical Exam  BP (!) 148/89   Pulse 70   Temp 97.8 F (36.6 C) (Oral)   Resp 16   Ht 6' (1.829 m)   Wt 113.4 kg   SpO2 96%   BMI 33.91 kg/m   Physical Exam Vitals signs and nursing note reviewed.  Constitutional:      General: He is not in acute distress.    Appearance: He is well-developed.     Comments: Resting comfortably in bed.   Eyes:     Conjunctiva/sclera: Conjunctivae normal.  Cardiovascular:     Rate and Rhythm: Normal rate.  Pulmonary:     Effort: Pulmonary effort is normal.  Musculoskeletal:     Comments: TTP to the cervical spine and bilat cervical paraspinous muscles that reproduces pain. 5/5 strength to BUE and BLE. Normal sensation throughout BUE and BLE.  Skin:    General: Skin is warm and dry.  Neurological:     Mental Status: He is alert and oriented to person, place, and time.     ED Course/Procedures     Procedures Results for orders placed or performed during the hospital encounter of 02/16/18  CBC  Result Value Ref Range   WBC 4.1 4.0 - 10.5 K/uL   RBC 4.39 4.22 - 5.81 MIL/uL   Hemoglobin 13.4 13.0 - 17.0 g/dL    HCT 41.2 39.0 - 52.0 %   MCV 93.8 78.0 - 100.0 fL   MCH 30.5 26.0 - 34.0 pg   MCHC 32.5 30.0 - 36.0 g/dL   RDW 13.1 11.5 - 15.5 %   Platelets 92 (L) 150 - 400 K/uL  Basic metabolic panel  Result Value Ref Range   Sodium 140 135 - 145 mmol/L   Potassium 4.0 3.5 - 5.1 mmol/L   Chloride 109 98 - 111 mmol/L   CO2 26 22 - 32 mmol/L   Glucose, Bld 91 70 - 99 mg/dL   BUN 21 8 - 23 mg/dL   Creatinine, Ser 1.22 0.61 - 1.24 mg/dL   Calcium 8.9 8.9 - 10.3 mg/dL   GFR calc non Af Amer >60 >60 mL/min   GFR calc Af Amer >60 >60 mL/min   Anion gap 5 5 - 15   Ct Cervical Spine Wo Contrast  Result Date: 09/27/2018 CLINICAL DATA:  Neck pain for 3 days.  Initial exam. EXAM: CT CERVICAL SPINE WITHOUT CONTRAST TECHNIQUE: Multidetector CT imaging of the cervical spine was performed without intravenous contrast. Multiplanar CT image reconstructions were also generated. COMPARISON:  CT myelogram 07/11/2016 FINDINGS: Alignment: Grade 1 anterolisthesis at C7-T1 is stable. AP alignment is otherwise anatomic. Skull base and  vertebrae: Craniocervical junction is normal. Degenerative changes are again noted at C1-2. No acute or healing fractures are present. Soft tissues and spinal canal: Soft tissues the neck demonstrate atherosclerotic calcifications at the carotid bifurcations bilaterally, left greater than right. No significant adenopathy is present. Salivary glands are within normal limits. Disc levels: There is solid fusion C2-C6. No significant change in uncovertebral spurring and right foraminal narrowing at C2-3, C3-4, and C4-5. Facet and uncovertebral disease contributes to bilateral foraminal narrowing at C6-7. Facet disease and spurring is progressed bilaterally at C7-T1 and T1-2. There is solid fusion at T2-3. Upper chest: The lung apices are clear. Thoracic inlet is within normal limits. IMPRESSION: 1. Progressive facet disease and spurring at C7-T1 and T1-2 contributes to foraminal narrowing. 2. Stable solid  fusion C2-6 without significant change in mild right foraminal narrowing at C2-3, C3-4, and C4-5. 3. No acute abnormality. 4. Atherosclerosis. Electronically Signed   By: San Morelle M.D.   On: 09/27/2018 07:54     MDM   pt with a h/o mult back and neck surgery presenting with c/o 3 day h/o neck pain.   H/o fall 1 month ago but did not recall hitting head or neck at that time. He is neuro intact. Pending CT imaging and reassessment after pain medications.   CT head shows progressive facet disease and spurring at C7-T1 and T1-T2 with foraminal narrowing. Remainder of CT appears grossly stable from prior.   On reassessment pt states he feels some improvement after dilaudid. He remains NVI. I discussed imaging results with him. I discussed plan to give rx for prednisone taper. Will also give rx for antiemetics to take with his home 10/325 oxycodones that he currently states he is not taking due to GI upset. Advised him to take these pain meds as he is prescribed. Advised him to contact his neurosurgeon, Dr. Arnoldo Morale for f/u appointment. Informed of return precautions. All questions answered.      Bishop Dublin 09/27/18 [id number]    Davonna Belling, MD 09/27/18 1538

## 2018-09-27 NOTE — ED Provider Notes (Signed)
Patient seen/examined in the Emergency Department in conjunction with Advanced Practice Provider [facility] Patient reports neck pain, similar to prior episodes, no weakness Exam : Awake alert, appears uncomfortable.  Able to move all 4 extremities without difficulty Plan: CT neck pending.  Anticipate discharge.    Ripley Fraise, MD 09/27/18 [phone]

## 2018-09-27 NOTE — ED Provider Notes (Signed)
[facility] EMERGENCY DEPARTMENT Provider Note   CSN: [id number] Arrival date & time: 09/27/18  0609    History   Chief Complaint Chief Complaint  Patient presents with  . Neck Pain    HPI KEMOND AMORIN is a 68 y.o. male.    68 y/o male with hx of ascending aortic aneurysm, CAD, GERD, HTN, HLD presents to the ED for 3 days of constant neck pain. Pain is sharp radiates between the base of his skull down to his lower neck. It is aggravated with movement and has remained constant. He has been taking his home Flexeril, Valium, Lyrica without relief. Has 10/325 Percocet, but has not used this due to side effects of nausea.  Had a mechanical fall ~4-5 weeks ago, but does not recall striking his head or neck with this.  Denies associated vision changes/loss, chest pain, new or worsening SOB, bowel/bladder incontinence, new/worsening numbness or paresthesias, extremity weakness since onset of symptoms.  Hx of multiple prior neck and back surgeries by Dr. Arnoldo Morale.    Cannot undergo MRI due to presence of nerve stimulator device.  The history is provided by the patient. No language interpreter was used.  Neck Pain    Past Medical History:  Diagnosis Date  . Anxiety    patient denies  . Aortic aneurysm (HCC)    ascending  . Arthritis   . Barrett esophagus   . Blood transfusion   . Cancer ([place]) 2007   melanoma stage 3 stomach bx  . Coronary artery disease    mild by 09/08/13 cath HPR  . GERD (gastroesophageal reflux disease)    barretts esophagus  . H/O hiatal hernia   . Headache(784.0)   . Hypercholesteremia   . Hypertension   . PONV (postoperative nausea and vomiting)    trouble breathing after last surgery (at [facility])  . Shingles    10  . Sleep apnea    not tested  . Tumor liver    no problems    Patient Active Problem List   Diagnosis Date Noted  . Special screening for malignant neoplasms, colon 01/26/2018  . Gastroesophageal reflux disease without  esophagitis 01/26/2018  . Barrett's esophagus without dysplasia 01/26/2018  . Thrombocytopenia ([place]) 04/02/2017  . Lumbar stenosis with neurogenic claudication 11/03/2016  . Intractable vomiting with nausea 05/29/2016  . OSA (obstructive sleep apnea) 11/18/2013  . Liver lesion 03/01/2013  . Barrett's esophagus 03/01/2013  . GERD (gastroesophageal reflux disease) 03/01/2013  . Essential hypertension, benign 03/01/2013  . S/P lumbar fusion 03/25/2012  . Back pain without radiation 03/25/2012    Past Surgical History:  Procedure Laterality Date  . ABDOMINAL SURGERY     x3  . back surgeries     x 9  . BACK SURGERY     x8  . BIOPSY N/A 03/28/2013   Procedure: Distal Esophageal Biopsy;  Surgeon: Rogene Houston, MD;  Location: [facility];  Service: Endoscopy;  Laterality: N/A;  . BIOPSY  05/30/2016   Procedure: BIOPSY;  Surgeon: Rogene Houston, MD;  Location: [facility];  Service: Endoscopy;;  . BIOPSY  02/19/2018   Procedure: BIOPSY;  Surgeon: Rogene Houston, MD;  Location: [facility];  Service: Endoscopy;;  Barret's esophagus  . CHEST SURGERY     lung incisional hernia for hernia  . COLONOSCOPY WITH PROPOFOL N/A 02/19/2018   Procedure: COLONOSCOPY WITH PROPOFOL;  Surgeon: Rogene Houston, MD;  Location: [facility];  Service: Endoscopy;  Laterality:  N/A;  1:00  . ESOPHAGOGASTRODUODENOSCOPY (EGD) WITH PROPOFOL N/A 03/28/2013   Procedure: ESOPHAGOGASTRODUODENOSCOPY (EGD) WITH PROPOFOL;  Surgeon: Rogene Houston, MD;  Location: [facility];  Service: Endoscopy;  Laterality: N/A;  GE junction @ 38, proximal  margin @37   . ESOPHAGOGASTRODUODENOSCOPY (EGD) WITH PROPOFOL N/A 05/30/2016   Procedure: ESOPHAGOGASTRODUODENOSCOPY (EGD) WITH PROPOFOL;  Surgeon: Rogene Houston, MD;  [facility];  Service: Endoscopy;  Laterality: N/A;  955  . ESOPHAGOGASTRODUODENOSCOPY (EGD) WITH PROPOFOL N/A 02/19/2018   Procedure: ESOPHAGOGASTRODUODENOSCOPY (EGD) WITH PROPOFOL;  Surgeon: Rogene Houston, MD;  [facility];  Service: Endoscopy;  Laterality: N/A;  . HEMORRHOID SURGERY     x2  . HERNIA REPAIR    . HIATAL HERNIA REPAIR    . JOINT REPLACEMENT     lft knee  . KNEE ARTHROPLASTY     lft  . KNEE ARTHROSCOPY Right   . LEG SURGERY     x4 crushed   . LITHOTRIPSY    . MELANOMA EXCISION     lft abdomen  . NECK SURGERY  2012  . POLYPECTOMY  02/19/2018   Procedure: POLYPECTOMY;  Surgeon: Rogene Houston, MD;  [facility];  Service: Endoscopy;;  proximal transverse colon (CB x1), descending colon (CBx3)  . POSTERIOR CERVICAL FUSION/FORAMINOTOMY  08/18/2011   Procedure: POSTERIOR CERVICAL FUSION/FORAMINOTOMY LEVEL 3;  Surgeon: Ophelia Charter, MD;  Location: [place] [facility];  Service: Neurosurgery;  Laterality: N/A;  Cervical three-four, four-five, five-six posterior cervical  fusion with instrumentation; Right cervical three-four, four-five laminotomy  . REVISION TOTAL HIP ARTHROPLASTY Left   . SHOULDER ARTHROSCOPY     rt bith defect , spur, rotator cuff  . SPINAL CORD STIMULATOR IMPLANT     rt hip  . SPINAL CORD STIMULATOR INSERTION N/A 11/03/2016   Procedure: LUMBAR SPINAL CORD STIMULATOR REVISION;  Surgeon: Newman Pies, MD;  Location: [place];  Service: Neurosurgery;  Laterality: N/A;  . THYROID LOBECTOMY     rt        Home Medications    Prior to Admission medications   Medication Sig Start Date End Date Taking? Authorizing Provider  Aspirin-Acetaminophen-Caffeine (GOODY HEADACHE PO) Take 1-2 packets by mouth daily as needed (headaches).    [provider]  Bioflavonoid Products (ESTER C PO) Take 1 tablet by mouth daily.    [provider]  calcium-vitamin D (OSCAL WITH D) 500-200 MG-UNIT per tablet Take 1 tablet by mouth 2 (two) times daily.     [provider]  cyclobenzaprine (FLEXERIL) 10 MG tablet Take 1 tablet (10 mg total) by mouth 3 (three) times daily as needed for muscle spasms. 11/05/16   Newman Pies, MD  dexlansoprazole (DEXILANT) 60 MG capsule Take 60 mg by mouth daily.    [provider]  diazepam (VALIUM) 10 MG tablet Take 10 mg by mouth 4 (four) times daily as needed (for back pain).     [provider]  fexofenadine (ALLEGRA) 180 MG tablet Take 180 mg by mouth every evening.    [provider]  fish oil-omega-3 fatty acids 1000 MG capsule Take 1 g by mouth 2 (two) times daily.    [provider]  lisinopril (PRINIVIL,ZESTRIL) 10 MG tablet Take 10 mg by mouth daily.    [provider]  montelukast (SINGULAIR) 10 MG tablet Take 10 mg by mouth daily.     [provider]  Multiple Vitamin (MULITIVITAMIN WITH MINERALS) TABS Take 1 tablet by  mouth daily.    [provider]  ondansetron (ZOFRAN) 4 MG tablet Take 4 mg by mouth every 8 (eight) hours as needed for nausea or vomiting.    [provider]  oxyCODONE-acetaminophen (PERCOCET) 10-325 MG tablet Take 1 tablet by mouth every 6 (six) hours as needed for pain.    [provider]  pregabalin (LYRICA) 150 MG capsule Take 150 mg by mouth 3 (three) times daily.    [provider]  PRESCRIPTION MEDICATION Take 10 mg by mouth 2 (two) times daily. Domperidone 10 mg from [place]    [provider]  rosuvastatin (CRESTOR) 10 MG tablet Take 10 mg by mouth every evening.     [provider]    Family History Family History  Problem Relation Age of Onset  . Heart disease Father   . Heart disease Paternal Grandmother   . Heart disease Paternal Grandfather   . Heart disease Maternal Grandfather   . Heart disease Brother        heart transplant  . Aneurysm Other        pt states several members have aortic aneurysm  . Skin cancer Other        several members  . Lymphoma Brother     Social History Social History   Tobacco Use  . Smoking status: Former Smoker    Packs/day: 4.00    Years: 10.00    Pack years: 40.00     Types: Cigarettes    Last attempt to quit: 08/12/1980    Years since quitting: 38.1  . Smokeless tobacco: Never Used  . Tobacco comment: occ wine  Substance Use Topics  . Alcohol use: Yes    Alcohol/week: 0.0 standard drinks    Comment: rarely  . Drug use: No     Allergies   Demerol   Review of Systems Review of Systems  Musculoskeletal: Positive for neck pain.  Ten systems reviewed and are negative for acute change, except as noted in the HPI.    Physical Exam Updated Vital Signs BP (!) 126/100 (BP Location: Left Arm)   Pulse 71   Temp 97.8 F (36.6 C) (Oral)   Resp 16   Ht 6' (1.829 m)   Wt 113.4 kg   SpO2 98%   BMI 33.91 kg/m   Physical Exam Vitals signs and nursing note reviewed.  Constitutional:      General: He is not in acute distress.    Appearance: He is well-developed. He is not diaphoretic.     Comments: Patient appears uncomfortable, but nontoxic.  He is in no acute distress.  HENT:     Head: Normocephalic and atraumatic.  Eyes:     General: No scleral icterus.    Conjunctiva/sclera: Conjunctivae normal.  Neck:     Comments: TTP to bilateral cervical paraspinal muscles. No bony deformities, step-offs, crepitus. Cardiovascular:     Rate and Rhythm: Normal rate and regular rhythm.     Pulses: Normal pulses.     Comments: Distal radial pulse 2+ bilaterally Pulmonary:     Effort: Pulmonary effort is normal. No respiratory distress.     Comments: Respirations even and unlabored Musculoskeletal: Normal range of motion.  Skin:    General: Skin is warm and dry.     Coloration: Skin is not pale.     Findings: No erythema or rash.  Neurological:     General: No focal deficit present.     Mental Status: He is alert and  oriented to person, place, and time.     Coordination: Coordination normal.     Comments: GCS 15.  Speech is goal oriented.  Patient has equal grip strength bilaterally with 5/5 strength against resistance in all major muscle groups  bilaterally.  Normal shoulder shrug against resistance.  Sensation to light touch intact bilaterally, throughout.  Psychiatric:        Behavior: Behavior normal.      ED Treatments / Results  Labs (all labs ordered are listed, but only abnormal results are displayed) Labs Reviewed - No data to display  EKG None  Radiology No results found.  Procedures Procedures (including critical care time)  Medications Ordered in ED Medications  dexamethasone (DECADRON) injection 10 mg (has no administration in time range)  methocarbamol (ROBAXIN) 500 mg in dextrose 5 % 50 mL IVPB (has no administration in time range)  ondansetron (ZOFRAN) injection 4 mg (has no administration in time range)  HYDROmorphone (DILAUDID) injection 0.5 mg (has no administration in time range)     Initial Impression / Assessment and Plan / ED Course  I have reviewed the triage vital signs and the nursing notes.  Pertinent labs & imaging results that were available during my care of the patient were reviewed by me and considered in my medical decision making (see chart for details).        68 year old male presents to the emergency department for evaluation of 3 days of neck pain.  History of multiple neck and back surgeries by Dr. Arnoldo Morale.  He is unable to undergo MRI due to presence of a nerve stimulator.  CT cervical spine has been ordered for further evaluation of pain.  Patient ordered to receive Robaxin, Decadron.  1 dose of Dilaudid also ordered with Zofran given known side effects of nausea with use of pain medicine.  If imaging reassuring and pain better controlled, discharge home with outpatient NS f/u may be reasonable.  Patient signed out to Marvia Pickles, PA-C at change of shift pending imaging results and symptom reassessment.   Final Clinical Impressions(s) / ED Diagnoses   Final diagnoses:  Neck pain, acute    ED Discharge Orders    None       Antonietta Breach, PA-C 09/27/18 [id number]     Ripley Fraise, MD 09/27/18 [phone]

## 2018-09-27 NOTE — Discharge Instructions (Signed)
Please take prednisone as prescribed.   You were also given a prescription of zofran to take with your home pain medications.   Please call Dr. Arnoldo Morale office today to make an appointment for follow up in regards to your neck pain.  Return to the emergency department immediately if you experience any back/neck pain associated with fevers, loss of control of your bowels/bladder, weakness/numbness to your arms/legs, numbness to your groin area, inability to walk, or inability to urinate.

## 2018-10-05 DIAGNOSIS — M542 Cervicalgia: Secondary | ICD-10-CM | POA: Diagnosis not present

## 2018-10-11 DIAGNOSIS — Z0001 Encounter for general adult medical examination with abnormal findings: Secondary | ICD-10-CM | POA: Diagnosis not present

## 2018-10-11 DIAGNOSIS — E785 Hyperlipidemia, unspecified: Secondary | ICD-10-CM | POA: Diagnosis not present

## 2018-10-11 DIAGNOSIS — Z1389 Encounter for screening for other disorder: Secondary | ICD-10-CM | POA: Diagnosis not present

## 2018-10-15 ENCOUNTER — Other Ambulatory Visit: Payer: Self-pay | Admitting: Neurosurgery

## 2018-10-15 DIAGNOSIS — M542 Cervicalgia: Secondary | ICD-10-CM

## 2018-10-21 DIAGNOSIS — K219 Gastro-esophageal reflux disease without esophagitis: Secondary | ICD-10-CM | POA: Diagnosis not present

## 2018-10-21 DIAGNOSIS — I10 Essential (primary) hypertension: Secondary | ICD-10-CM | POA: Diagnosis not present

## 2018-10-21 DIAGNOSIS — Z79899 Other long term (current) drug therapy: Secondary | ICD-10-CM | POA: Diagnosis not present

## 2018-10-21 DIAGNOSIS — E785 Hyperlipidemia, unspecified: Secondary | ICD-10-CM | POA: Diagnosis not present

## 2018-10-22 ENCOUNTER — Telehealth: Payer: Self-pay

## 2018-10-22 NOTE — Telephone Encounter (Signed)
Spoke with patient to screen his medications and allergies prior to being scheduled for a myelogram.  He was informed he needs to have a driver, he will be here 2-2.5 hours and that he will need to be on strict bedrest for 24 hours after the procedure.  He also was informed he wil need to hold his Goody's and BC Powders for three days before the myelogram.

## 2018-10-29 ENCOUNTER — Inpatient Hospital Stay (HOSPITAL_COMMUNITY): Payer: Medicare Other | Attending: Hematology | Admitting: Hematology

## 2018-10-29 ENCOUNTER — Encounter (HOSPITAL_COMMUNITY): Payer: Self-pay | Admitting: Hematology

## 2018-10-29 ENCOUNTER — Other Ambulatory Visit: Payer: Self-pay

## 2018-10-29 ENCOUNTER — Inpatient Hospital Stay (HOSPITAL_COMMUNITY): Payer: Medicare Other

## 2018-10-29 VITALS — BP 128/92 | HR 95 | Temp 98.1°F | Resp 18 | Wt 256.0 lb

## 2018-10-29 DIAGNOSIS — Z8249 Family history of ischemic heart disease and other diseases of the circulatory system: Secondary | ICD-10-CM | POA: Diagnosis not present

## 2018-10-29 DIAGNOSIS — R51 Headache: Secondary | ICD-10-CM

## 2018-10-29 DIAGNOSIS — Z79899 Other long term (current) drug therapy: Secondary | ICD-10-CM | POA: Diagnosis not present

## 2018-10-29 DIAGNOSIS — Z7982 Long term (current) use of aspirin: Secondary | ICD-10-CM | POA: Insufficient documentation

## 2018-10-29 DIAGNOSIS — Z85820 Personal history of malignant melanoma of skin: Secondary | ICD-10-CM

## 2018-10-29 DIAGNOSIS — Z87891 Personal history of nicotine dependence: Secondary | ICD-10-CM

## 2018-10-29 DIAGNOSIS — D696 Thrombocytopenia, unspecified: Secondary | ICD-10-CM | POA: Diagnosis not present

## 2018-10-29 LAB — FOLATE: Folate: 40 ng/mL (ref >5.9)

## 2018-10-29 LAB — CBC WITH DIFFERENTIAL/PLATELET
Band Neutrophils: 0 %
Basophils Absolute: 0 10*3/uL (ref 0.0–0.1)
Basophils Relative: 0 %
Blasts: 0 %
Eosinophils Absolute: 0.1 10*3/uL (ref 0.0–0.5)
Eosinophils Relative: 2 %
HCT: 48.7 % (ref 39.0–52.0)
Hemoglobin: 16.1 g/dL (ref 13.0–17.0)
Lymphocytes Relative: 28 %
Lymphs Abs: 1.1 10*3/uL (ref 0.7–4.0)
MCH: 30.7 pg (ref 26.0–34.0)
MCHC: 33.1 g/dL (ref 30.0–36.0)
MCV: 92.9 fL (ref 80.0–100.0)
Metamyelocytes Relative: 0 %
Monocytes Absolute: 0.3 10*3/uL (ref 0.1–1.0)
Monocytes Relative: 8 %
Myelocytes: 0 %
Neutro Abs: 2.3 10*3/uL (ref 1.7–7.7)
Neutrophils Relative %: 62 %
Other: 0 %
Platelets: 109 10*3/uL — ABNORMAL LOW (ref 150–400)
Promyelocytes Relative: 0 %
RBC: 5.24 MIL/uL (ref 4.22–5.81)
RDW: 13.1 % (ref 11.5–15.5)
WBC: 3.8 10*3/uL — ABNORMAL LOW (ref 4.0–10.5)
nRBC: 0 % (ref 0.0–0.2)
nRBC: 0 /100 WBC

## 2018-10-29 LAB — COMPREHENSIVE METABOLIC PANEL
ALT: 28 U/L (ref 0–44)
AST: 30 U/L (ref 15–41)
Albumin: 4.6 g/dL (ref 3.5–5.0)
Alkaline Phosphatase: 79 U/L (ref 38–126)
Anion gap: 13 (ref 5–15)
BUN: 29 mg/dL — ABNORMAL HIGH (ref 8–23)
CO2: 21 mmol/L — ABNORMAL LOW (ref 22–32)
Calcium: 9.5 mg/dL (ref 8.9–10.3)
Chloride: 105 mmol/L (ref 98–111)
Creatinine, Ser: 1.22 mg/dL (ref 0.61–1.24)
GFR calc Af Amer: >60 mL/min (ref >60)
GFR calc non Af Amer: >60 mL/min (ref >60)
Glucose, Bld: 95 mg/dL (ref 70–99)
Potassium: 3.9 mmol/L (ref 3.5–5.1)
Sodium: 139 mmol/L (ref 135–145)
Total Bilirubin: 1 mg/dL (ref 0.3–1.2)
Total Protein: 7.9 g/dL (ref 6.5–8.1)

## 2018-10-29 LAB — SAVE SMEAR (SSMR)

## 2018-10-29 LAB — VITAMIN B12: Vitamin B-12: 929 pg/mL — ABNORMAL HIGH (ref 180–914)

## 2018-10-29 LAB — RETICULOCYTES
Immature Retic Fract: 7.9 % (ref 2.3–15.9)
RBC.: 5.24 MIL/uL (ref 4.22–5.81)
Retic Count, Absolute: 94.8 10*3/uL (ref 19.0–186.0)
Retic Ct Pct: 1.8 % (ref 0.4–3.1)

## 2018-10-29 LAB — LACTATE DEHYDROGENASE: LDH: 171 U/L (ref 98–192)

## 2018-10-29 NOTE — Assessment & Plan Note (Signed)
1. Moderate thrombocytopenia: - Referral from Dr. Nolon Rod office for decreased platelet count of 80 on CBC done on 10/21/2018.  WBC and hemoglobin were within normal limits.  Normal differential. - Patient does report some easy bruising on the forearms.  Had one episode of nosebleed in 2019 and had to undergo cauterization.  He takes fish oil twice daily. -He had a history of blood transfusion x2 in the past.  He also had tattoos placed in the early 70s. -Denies any fevers, night sweats or weight loss.  However reports daytime sweats on exertion, mainly in the head and neck region.  He also reports headaches. - He worked in [employer].  He reports having spent at [facility] for 2 years while he served in the [employer]. - He smoked cigarettes for 10 years, 1 to 2 packs/day, and quit in 1982. -I have reviewed his blood work.  He has mild to moderate thrombocytopenia dating back to December 2017. -Differential diagnosis includes immune mediated thrombocytopenia, MDS and splenomegaly. - We will repeat his CBC with a blue top tube.  We will check his LDH, reticulocyte count, and rule out nutritional deficiencies by checking X45, folic acid and copper.  We will also check ANA and rheumatoid factor.  Check hepatitis panel and H. pylori testing. -We have also recommended CT abdomen to evaluate for splenomegaly.  We will see him back in 3 to 4 weeks to discuss results.  2.  Malignant melanoma: -He had melanoma resected from the left side of the abdomen, at [facility] 10 to 14 years ago.  Reportedly stage III.  He had axillary sentinel lymph node biopsy done.

## 2018-10-29 NOTE — Progress Notes (Signed)
CONSULT NOTE  Patient Care Team: Redmond School, MD as PCP - General (Internal Medicine)  CHIEF COMPLAINTS/PURPOSE OF CONSULTATION:  Thrombocytopenia.  HISTORY OF PRESENTING ILLNESS:  [name] 68 y.o. male is seen in consultation today for further work-up and management of thrombocytopenia.  Recent blood work from 10/21/2018 shows platelet count of 80.  Denies any nosebleeds, bleeding per rectum or hematuria.  He had a history of nosebleeds in 2019 and had cauterization done.  Reports easy bruising on the forearms.  No bruising on the lower extremities reported.  Denies any fevers, night sweats or weight loss in the last 6 months.  Reports headaches occasionally.  Also reports sweating of the head and neck region when he works outside.  He is taking fish oil twice daily.  Denies any quinine compounds.  He had a history of blood transfusion x2 in the past.  He also had tattoos in the early 1970s.  Family history significant for brother who died of T-cell lymphoma.  Patient also has a history of aortic aneurysm measuring about 4.6 cm.  He also reports loss of energy.  He is to be working in [employer].  He also served in the [employer] and was stationed at [employer] for 2 years.   MEDICAL HISTORY:  Past Medical History:  Diagnosis Date  . Anxiety    patient denies  . Aortic aneurysm (HCC)    ascending  . Arthritis   . Barrett esophagus   . Blood transfusion   . Cancer ([place]) 2007   melanoma stage 3 stomach bx  . Coronary artery disease    mild by 09/08/13 cath [facility]  . GERD (gastroesophageal reflux disease)    barretts esophagus  . H/O hiatal hernia   . Headache(784.0)   . Hypercholesteremia   . Hypertension   . PONV (postoperative nausea and vomiting)    trouble breathing after last surgery (at [facility])  . Shingles    10  . Sleep apnea    not tested  . Tumor liver    no problems    SURGICAL HISTORY: Past Surgical History:  Procedure Laterality Date  .  ABDOMINAL SURGERY     x3  . back surgeries     x 9  . BACK SURGERY     x8  . BIOPSY N/A 03/28/2013   Procedure: Distal Esophageal Biopsy;  Surgeon: Rogene Houston, MD;  Location: [facility];  Service: Endoscopy;  Laterality: N/A;  . BIOPSY  05/30/2016   Procedure: BIOPSY;  Surgeon: Rogene Houston, MD;  [facility];  Service: Endoscopy;;  . BIOPSY  02/19/2018   Procedure: BIOPSY;  Surgeon: Rogene Houston, MD;  [facility];  Service: Endoscopy;;  Barret's esophagus  . CHEST SURGERY     lung incisional hernia for hernia  . COLONOSCOPY WITH PROPOFOL N/A 02/19/2018   Procedure: COLONOSCOPY WITH PROPOFOL;  Surgeon: Rogene Houston, MD;  [facility];  Service: Endoscopy;  Laterality: N/A;  1:00  . ESOPHAGOGASTRODUODENOSCOPY (EGD) WITH PROPOFOL N/A 03/28/2013   Procedure: ESOPHAGOGASTRODUODENOSCOPY (EGD) WITH PROPOFOL;  Surgeon: Rogene Houston, MD;  Location: [facility];  Service: Endoscopy;  Laterality: N/A;  GE junction @ 38, proximal  margin @37   . ESOPHAGOGASTRODUODENOSCOPY (EGD) WITH PROPOFOL N/A 05/30/2016   Procedure: ESOPHAGOGASTRODUODENOSCOPY (EGD) WITH PROPOFOL;  Surgeon: Rogene Houston, MD;  [facility];  Service: Endoscopy;  Laterality: N/A;  955  . ESOPHAGOGASTRODUODENOSCOPY (EGD) WITH PROPOFOL N/A 02/19/2018  Procedure: ESOPHAGOGASTRODUODENOSCOPY (EGD) WITH PROPOFOL;  Surgeon: Rogene Houston, MD;  [facility];  Service: Endoscopy;  Laterality: N/A;  . HEMORRHOID SURGERY     x2  . HERNIA REPAIR    . HIATAL HERNIA REPAIR    . JOINT REPLACEMENT     lft knee  . KNEE ARTHROPLASTY     lft  . KNEE ARTHROSCOPY Right   . LEG SURGERY     x4 crushed   . LITHOTRIPSY    . MELANOMA EXCISION     lft abdomen  . NECK SURGERY  2012  . POLYPECTOMY  02/19/2018   Procedure: POLYPECTOMY;  Surgeon: Rogene Houston, MD;  [facility];  Service: Endoscopy;;  proximal transverse colon (CB x1), descending colon (CBx3)  .  POSTERIOR CERVICAL FUSION/FORAMINOTOMY  08/18/2011   Procedure: POSTERIOR CERVICAL FUSION/FORAMINOTOMY LEVEL 3;  Surgeon: Ophelia Charter, MD;  Location: [facility];  Service: Neurosurgery;  Laterality: N/A;  Cervical three-four, four-five, five-six posterior cervical  fusion with instrumentation; Right cervical three-four, four-five laminotomy  . REVISION TOTAL HIP ARTHROPLASTY Left   . SHOULDER ARTHROSCOPY     rt bith defect , spur, rotator cuff  . SPINAL CORD STIMULATOR IMPLANT     rt hip  . SPINAL CORD STIMULATOR INSERTION N/A 11/03/2016   Procedure: LUMBAR SPINAL CORD STIMULATOR REVISION;  Surgeon: Newman Pies, MD;  Location: [facility];  Service: Neurosurgery;  Laterality: N/A;  . THYROID LOBECTOMY     rt    SOCIAL HISTORY: Social History   Socioeconomic History  . Marital status: Married    Spouse name: Not on file  . Number of children: Not on file  . Years of education: Not on file  . Highest education level: Not on file  Occupational History  . Occupation: retired  Scientific laboratory technician  . Financial resource strain: Not on file  . Food insecurity:    Worry: Not on file    Inability: Not on file  . Transportation needs:    Medical: Not on file    Non-medical: Not on file  Tobacco Use  . Smoking status: Former Smoker    Packs/day: 4.00    Years: 10.00    Pack years: 40.00    Types: Cigarettes    Last attempt to quit: 08/12/1980    Years since quitting: 38.2  . Smokeless tobacco: Never Used  . Tobacco comment: occ wine  Substance and Sexual Activity  . Alcohol use: Yes    Alcohol/week: 0.0 standard drinks    Comment: rarely  . Drug use: No  . Sexual activity: Yes    Birth control/protection: None  Lifestyle  . Physical activity:    Days per week: Not on file    Minutes per session: Not on file  . Stress: Not on file  Relationships  . Social connections:    Talks on phone: Not on file    Gets together: Not on file    Attends religious service: Not on file     Active member of club or organization: Not on file    Attends meetings of clubs or organizations: Not on file    Relationship status: Not on file  . Intimate partner violence:    Fear of current or ex partner: Not on file    Emotionally abused: Not on file    Physically abused: Not on file    Forced sexual activity: Not on file  Other Topics Concern  . Not on file  Social History Narrative  . Not on file    FAMILY HISTORY: Family History  Problem Relation Age of Onset  . Heart disease Father   . Aneurysm Mother   . Aneurysm Sister   . Heart disease Paternal Grandmother   . Heart disease Paternal Grandfather   . Heart disease Maternal Grandfather   . Heart disease Brother        heart transplant  . Aneurysm Other        pt states several members have aortic aneurysm  . Skin cancer Other        several members  . Lymphoma Brother   . Aneurysm Maternal Grandmother     ALLERGIES:  is allergic to demerol.  MEDICATIONS:  Current Outpatient Medications  Medication Sig Dispense Refill  . Aspirin-Acetaminophen-Caffeine (GOODY HEADACHE PO) Take 1-2 packets by mouth daily as needed (headaches).    Marland Kitchen Bioflavonoid Products (ESTER C PO) Take 1 tablet by mouth daily.    . calcium-vitamin D (OSCAL WITH D) 500-200 MG-UNIT per tablet Take 1 tablet by mouth 2 (two) times daily.     . cyclobenzaprine (FLEXERIL) 10 MG tablet Take 1 tablet (10 mg total) by mouth 3 (three) times daily as needed for muscle spasms. 50 tablet 1  . dexlansoprazole (DEXILANT) 60 MG capsule Take 60 mg by mouth daily.    . diazepam (VALIUM) 10 MG tablet Take 10 mg by mouth 4 (four) times daily as needed (for back pain).     . fexofenadine (ALLEGRA) 180 MG tablet Take 180 mg by mouth every evening.    . fish oil-omega-3 fatty acids 1000 MG capsule Take 1 g by mouth 2 (two) times daily.    Marland Kitchen lisinopril (PRINIVIL,ZESTRIL) 10 MG tablet Take 10 mg by mouth daily.    . montelukast (SINGULAIR) 10 MG tablet Take 10 mg by  mouth daily.     . Multiple Vitamin (MULITIVITAMIN WITH MINERALS) TABS Take 1 tablet by mouth daily.    . ondansetron (ZOFRAN) 4 MG tablet Take 1 tablet (4 mg total) by mouth every 8 (eight) hours as needed for nausea or vomiting. 6 tablet 0  . oxyCODONE-acetaminophen (PERCOCET) 10-325 MG tablet Take 1 tablet by mouth every 6 (six) hours as needed for pain.    . pregabalin (LYRICA) 150 MG capsule Take 150 mg by mouth 3 (three) times daily.    Marland Kitchen PRESCRIPTION MEDICATION Take 10 mg by mouth 2 (two) times daily. Domperidone 10 mg from [place]    . rosuvastatin (CRESTOR) 10 MG tablet Take 10 mg by mouth every evening.      No current facility-administered medications for this visit.     REVIEW OF SYSTEMS:   Constitutional: Denies fevers, chills or abnormal night sweats Eyes: Denies blurriness of vision, double vision or watery eyes Ears, nose, mouth, throat, and face: Denies mucositis or sore throat Respiratory: Positive for dyspnea on exertion. Cardiovascular: Denies palpitation, chest discomfort or lower extremity swelling Gastrointestinal:  Denies nausea, heartburn or change in bowel habits Skin: Denies abnormal skin rashes Lymphatics: Denies new lymphadenopathy or easy bruising Neurological:Denies numbness, tingling or new weaknesses.  Positive for headaches. Behavioral/Psych: Mood is stable, no new changes  All other systems were reviewed with the patient and are negative.  PHYSICAL EXAMINATION: ECOG PERFORMANCE STATUS: 1 - Symptomatic but completely ambulatory  Vitals:   10/29/18 1308  BP: (!) 128/92  Pulse: 95  Resp: 18  Temp: 98.1 F (36.7 C)  SpO2: 96%   Filed Weights  10/29/18 1308  Weight: 256 lb (116.1 kg)    GENERAL:alert, no distress and comfortable SKIN: skin color, texture, turgor are normal, no rashes or significant lesions EYES: normal, conjunctiva are pink and non-injected, sclera clear OROPHARYNX:no exudate, no erythema and lips, buccal mucosa, and  tongue normal  NECK: supple, thyroid normal size, non-tender, without nodularity LYMPH:  no palpable lymphadenopathy in the cervical, axillary or inguinal LUNGS: clear to auscultation and percussion with normal breathing effort HEART: regular rate & rhythm and no murmurs and no lower extremity edema ABDOMEN:abdomen soft, non-tender and normal bowel sounds Musculoskeletal:no cyanosis of digits and no clubbing  PSYCH: alert & oriented x 3 with fluent speech NEURO: no focal motor/sensory deficits  LABORATORY DATA:  I have reviewed the data as listed  RADIOGRAPHIC STUDIES: I have personally reviewed the radiological images as listed and agreed with the findings in the report.  ASSESSMENT & PLAN:  Thrombocytopenia ([place]) 1. Moderate thrombocytopenia: - Referral from Dr. Nolon Rod office for decreased platelet count of 80 on CBC done on 10/21/2018.  WBC and hemoglobin were within normal limits.  Normal differential. - Patient does report some easy bruising on the forearms.  Had one episode of nosebleed in 2019 and had to undergo cauterization.  He takes fish oil twice daily. -He had a history of blood transfusion x2 in the past.  He also had tattoos placed in the early 70s. -Denies any fevers, night sweats or weight loss.  However reports daytime sweats on exertion, mainly in the head and neck region.  He also reports headaches. - He worked in [employer].  He reports having spent at [facility] for 2 years while he served in the [employer]. - He smoked cigarettes for 10 years, 1 to 2 packs/day, and quit in 1982. -I have reviewed his blood work.  He has mild to moderate thrombocytopenia dating back to December 2017. -Differential diagnosis includes immune mediated thrombocytopenia, MDS and splenomegaly. - We will repeat his CBC with a blue top tube.  We will check his LDH, reticulocyte count, and rule out nutritional deficiencies by checking Y38, folic acid and copper.  We will also  check ANA and rheumatoid factor.  Check hepatitis panel and H. pylori testing. -We have also recommended CT abdomen to evaluate for splenomegaly.  We will see him back in 3 to 4 weeks to discuss results.  2.  Malignant melanoma: -He had melanoma resected from the left side of the abdomen, at [facility] 10 to 14 years ago.  Reportedly stage III.  He had axillary sentinel lymph node biopsy done.     All questions were answered. The patient knows to call the clinic with any problems, questions or concerns. Total time spent is 60 minutes with more than 50% of the time spent face-to-face discussing work-up plan and coordination of care.     Derek Jack, MD 10/29/18 2:16 PM

## 2018-10-30 LAB — HEPATITIS B SURFACE ANTIGEN: Hepatitis B Surface Ag: NEGATIVE

## 2018-10-30 LAB — HIV ANTIBODY (ROUTINE TESTING W REFLEX): HIV Screen 4th Generation wRfx: NONREACTIVE

## 2018-10-30 LAB — HEPATITIS C ANTIBODY: HCV Ab: <0.1 s/co ratio (ref 0.0–0.9)

## 2018-10-30 LAB — HEPATITIS B SURFACE ANTIBODY,QUALITATIVE: Hep B S Ab: NONREACTIVE

## 2018-10-30 LAB — RHEUMATOID FACTOR: Rheumatoid fact SerPl-aCnc: <10 IU/mL (ref 0.0–13.9)

## 2018-11-01 LAB — H PYLORI, IGM, IGG, IGA AB
H Pylori IgG: 0.28 Index Value (ref 0.00–0.79)
H. Pylogi, Iga Abs: <9 units (ref 0.0–8.9)
H. Pylogi, Igm Abs: <9 units (ref 0.0–8.9)

## 2018-11-01 LAB — ANTINUCLEAR ANTIBODIES, IFA: ANA Ab, IFA: NEGATIVE

## 2018-11-02 LAB — COPPER, SERUM: Copper: 86 ug/dL (ref 72–166)

## 2018-11-05 ENCOUNTER — Ambulatory Visit: Payer: Medicare Other

## 2018-11-08 ENCOUNTER — Ambulatory Visit (HOSPITAL_COMMUNITY)
Admission: RE | Admit: 2018-11-08 | Discharge: 2018-11-08 | Disposition: A | Discharge status: Home / Self Care | Payer: Medicare Other | Source: Ambulatory Visit | Attending: Hematology | Admitting: Hematology

## 2018-11-08 ENCOUNTER — Ambulatory Visit (HOSPITAL_COMMUNITY): Payer: Medicare Other | Admitting: Hematology

## 2018-11-08 ENCOUNTER — Other Ambulatory Visit: Payer: Self-pay

## 2018-11-08 DIAGNOSIS — D696 Thrombocytopenia, unspecified: Secondary | ICD-10-CM | POA: Diagnosis not present

## 2018-11-08 DIAGNOSIS — K5730 Diverticulosis of large intestine without perforation or abscess without bleeding: Secondary | ICD-10-CM | POA: Diagnosis not present

## 2018-11-08 MED ORDER — IOHEXOL 300 MG/ML  SOLN
100.0000 mL | Freq: Once | INTRAMUSCULAR | Status: AC | PRN
  Administered 2018-11-08: 17:00:00 100 mL via INTRAVENOUS

## 2018-11-10 ENCOUNTER — Encounter (HOSPITAL_COMMUNITY): Payer: Self-pay | Admitting: Hematology

## 2018-11-10 ENCOUNTER — Inpatient Hospital Stay (HOSPITAL_COMMUNITY): Payer: Medicare Other | Attending: Hematology | Admitting: Hematology

## 2018-11-10 ENCOUNTER — Other Ambulatory Visit: Payer: Self-pay

## 2018-11-10 VITALS — BP 124/98 | HR 84 | Temp 98.1°F | Resp 16 | Wt 256.1 lb

## 2018-11-10 DIAGNOSIS — Z87891 Personal history of nicotine dependence: Secondary | ICD-10-CM

## 2018-11-10 DIAGNOSIS — Z79899 Other long term (current) drug therapy: Secondary | ICD-10-CM | POA: Insufficient documentation

## 2018-11-10 DIAGNOSIS — I10 Essential (primary) hypertension: Secondary | ICD-10-CM | POA: Diagnosis not present

## 2018-11-10 DIAGNOSIS — D696 Thrombocytopenia, unspecified: Secondary | ICD-10-CM | POA: Insufficient documentation

## 2018-11-10 NOTE — Assessment & Plan Note (Signed)
1. Moderate thrombocytopenia: - Referral from Dr. Nolon Rod office for decreased platelet count of 80 on CBC done on 10/21/2018.  WBC and hemoglobin were within normal limits.  Normal differential. - Patient does report some easy bruising on the forearms.  Had one episode of nosebleed in 2019 and had to undergo cauterization.  He takes fish oil twice daily. -He had a history of blood transfusion x2 in the past.  He also had tattoos placed in the early 70s. -He did not have any B symptoms.  He worked in [employer].  He also spent at [facility] [employer] for 2 years. - He smoked cigarettes for 10 years, 1 to 2 packs/day, quit in 1982. - We have reviewed his blood work.  He has mild leukopenia with white count of 3.8 and mild thrombocytopenia with platelet count of 109. - LDH was normal.  H83 folic acid and copper levels were normal.  Hepatitis panel was normal.  HIV test was normal.  H. pylori testing was negative. -CT abdomen on 11/08/2018 shows normal-sized spleen with no abdominal or pelvic pathology. -Differential diagnosis for his mild thrombocytopenia is immune mediated thrombocytopenia versus MDS. - Confirmation can be done by bone marrow aspiration and biopsy.  However we have decided to wait and watch at this time.  If there is a drastic decrease in his platelet count, will consider doing a bone marrow biopsy.    2.  Malignant melanoma: -He had melanoma resected from the left side of the abdomen, at [facility] 10 to 14 years ago.  Reportedly stage III.  He had axillary sentinel lymph node biopsy done.

## 2018-11-10 NOTE — Progress Notes (Signed)
[name], [place] [address]   CLINIC:  Medical Oncology/Hematology  PCP:  [facility], [place] [address] [phone]   REASON FOR VISIT:  Follow-up for thrombocytopenia.     INTERVAL HISTORY:  [name] 68 y.o. male returns for follow-up of mild thrombocytopenia.  We have seen him at prior visit for work-up of thrombocytopenia.  He denies any major bleeding.  Has some shortness of breath on exertion.  Appetite is 75%.  Energy levels are low.  Denies any fevers, night sweats or weight loss.  He was an ex-smoker.  He has some easy bruising on the forearms which has been stable for many years.  No recent hospitalizations or ER visits.  REVIEW OF SYSTEMS:  Review of Systems  Respiratory: Positive for shortness of breath.   Hematological: Bruises/bleeds easily.  All other systems reviewed and are negative.    PAST MEDICAL/SURGICAL HISTORY:  Past Medical History:  Diagnosis Date  . Anxiety    patient denies  . Aortic aneurysm (HCC)    ascending  . Arthritis   . Barrett esophagus   . Blood transfusion   . Cancer ([place]) 2007   melanoma stage 3 stomach bx  . Coronary artery disease    mild by 09/08/13 cath [facility]  . GERD (gastroesophageal reflux disease)    barretts esophagus  . H/O hiatal hernia   . Headache(784.0)   . Hypercholesteremia   . Hypertension   . PONV (postoperative nausea and vomiting)    trouble breathing after last surgery (at [facility])  . Shingles    10  . Sleep apnea    not tested  . Tumor liver    no problems   Past Surgical History:  Procedure Laterality Date  . ABDOMINAL SURGERY     x3  . back surgeries     x 9  . BACK SURGERY     x8  . BIOPSY N/A 03/28/2013   Procedure: Distal Esophageal Biopsy;  Surgeon: Rogene Houston, MD;  Location: [facility];  Service: Endoscopy;  Laterality: N/A;  . BIOPSY  05/30/2016   Procedure: BIOPSY;  Surgeon: Rogene Houston, MD;  [facility];  Service: Endoscopy;;  . BIOPSY  02/19/2018   Procedure: BIOPSY;  Surgeon: Rogene Houston, MD;  [facility];  Service: Endoscopy;;  Barret's esophagus  . CHEST SURGERY     lung incisional hernia for hernia  . COLONOSCOPY WITH PROPOFOL N/A 02/19/2018   Procedure: COLONOSCOPY WITH PROPOFOL;  Surgeon: Rogene Houston, MD;  [facility];  Service: Endoscopy;  Laterality: N/A;  1:00  . ESOPHAGOGASTRODUODENOSCOPY (EGD) WITH PROPOFOL N/A 03/28/2013   Procedure: ESOPHAGOGASTRODUODENOSCOPY (EGD) WITH PROPOFOL;  Surgeon: Rogene Houston, MD;  Location: [facility];  Service: Endoscopy;  Laterality: N/A;  GE junction @ 38, proximal  margin @37   . ESOPHAGOGASTRODUODENOSCOPY (EGD) WITH PROPOFOL N/A 05/30/2016   Procedure: ESOPHAGOGASTRODUODENOSCOPY (EGD) WITH PROPOFOL;  Surgeon: Rogene Houston, MD;  [facility];  Service: Endoscopy;  Laterality: N/A;  955  . ESOPHAGOGASTRODUODENOSCOPY (EGD) WITH PROPOFOL N/A 02/19/2018   Procedure: ESOPHAGOGASTRODUODENOSCOPY (EGD) WITH PROPOFOL;  Surgeon: Rogene Houston, MD;  [facility];  Service: Endoscopy;  Laterality: N/A;  . HEMORRHOID SURGERY     x2  . HERNIA REPAIR    . HIATAL HERNIA REPAIR    . JOINT REPLACEMENT     lft knee  . KNEE ARTHROPLASTY  lft  . KNEE ARTHROSCOPY Right   . LEG SURGERY     x4 crushed   . LITHOTRIPSY    . MELANOMA EXCISION     lft abdomen  . NECK SURGERY  2012  . POLYPECTOMY  02/19/2018   Procedure: POLYPECTOMY;  Surgeon: Rogene Houston, MD;  [facility];  Service: Endoscopy;;  proximal transverse colon (CB x1), descending colon (CBx3)  . POSTERIOR CERVICAL FUSION/FORAMINOTOMY  08/18/2011   Procedure: POSTERIOR CERVICAL FUSION/FORAMINOTOMY LEVEL 3;  Surgeon: Ophelia Charter, MD;  Location: [facility];  Service: Neurosurgery;  Laterality: N/A;  Cervical three-four, four-five, five-six posterior cervical  fusion with instrumentation; Right cervical three-four,  four-five laminotomy  . REVISION TOTAL HIP ARTHROPLASTY Left   . SHOULDER ARTHROSCOPY     rt bith defect , spur, rotator cuff  . SPINAL CORD STIMULATOR IMPLANT     rt hip  . SPINAL CORD STIMULATOR INSERTION N/A 11/03/2016   Procedure: LUMBAR SPINAL CORD STIMULATOR REVISION;  Surgeon: Newman Pies, MD;  Location: [facility];  Service: Neurosurgery;  Laterality: N/A;  . THYROID LOBECTOMY     rt     SOCIAL HISTORY:  Social History   Socioeconomic History  . Marital status: Married    Spouse name: Not on file  . Number of children: Not on file  . Years of education: Not on file  . Highest education level: Not on file  Occupational History  . Occupation: retired  Scientific laboratory technician  . Financial resource strain: Not on file  . Food insecurity:    Worry: Not on file    Inability: Not on file  . Transportation needs:    Medical: Not on file    Non-medical: Not on file  Tobacco Use  . Smoking status: Former Smoker    Packs/day: 4.00    Years: 10.00    Pack years: 40.00    Types: Cigarettes    Last attempt to quit: 08/12/1980    Years since quitting: 38.2  . Smokeless tobacco: Never Used  . Tobacco comment: occ wine  Substance and Sexual Activity  . Alcohol use: Yes    Alcohol/week: 0.0 standard drinks    Comment: rarely  . Drug use: No  . Sexual activity: Yes    Birth control/protection: None  Lifestyle  . Physical activity:    Days per week: Not on file    Minutes per session: Not on file  . Stress: Not on file  Relationships  . Social connections:    Talks on phone: Not on file    Gets together: Not on file    Attends religious service: Not on file    Active member of club or organization: Not on file    Attends meetings of clubs or organizations: Not on file    Relationship status: Not on file  . Intimate partner violence:    Fear of current or ex partner: Not on file    Emotionally abused: Not on file    Physically abused: Not on file    Forced sexual activity: Not  on file  Other Topics Concern  . Not on file  Social History Narrative  . Not on file    FAMILY HISTORY:  Family History  Problem Relation Age of Onset  . Heart disease Father   . Aneurysm Mother   . Aneurysm Sister   . Heart disease Paternal Grandmother   . Heart disease Paternal Grandfather   . Heart disease Maternal Grandfather   .  Heart disease Brother        heart transplant  . Aneurysm Other        pt states several members have aortic aneurysm  . Skin cancer Other        several members  . Lymphoma Brother   . Aneurysm Maternal Grandmother     CURRENT MEDICATIONS:  Outpatient Encounter Medications as of 11/10/2018  Medication Sig  . Aspirin-Acetaminophen-Caffeine (GOODY HEADACHE PO) Take 1-2 packets by mouth daily as needed (headaches).  Marland Kitchen Bioflavonoid Products (ESTER C PO) Take 1 tablet by mouth daily.  . calcium-vitamin D (OSCAL WITH D) 500-200 MG-UNIT per tablet Take 1 tablet by mouth 2 (two) times daily.   . cyclobenzaprine (FLEXERIL) 10 MG tablet Take 1 tablet (10 mg total) by mouth 3 (three) times daily as needed for muscle spasms.  Marland Kitchen dexlansoprazole (DEXILANT) 60 MG capsule Take 60 mg by mouth daily.  . diazepam (VALIUM) 10 MG tablet Take 10 mg by mouth 4 (four) times daily as needed (for back pain).   . fexofenadine (ALLEGRA) 180 MG tablet Take 180 mg by mouth every evening.  . fish oil-omega-3 fatty acids 1000 MG capsule Take 1 g by mouth 2 (two) times daily.  Marland Kitchen lisinopril (PRINIVIL,ZESTRIL) 10 MG tablet Take 10 mg by mouth daily.  . montelukast (SINGULAIR) 10 MG tablet Take 10 mg by mouth daily.   . Multiple Vitamin (MULITIVITAMIN WITH MINERALS) TABS Take 1 tablet by mouth daily.  . ondansetron (ZOFRAN) 4 MG tablet Take 1 tablet (4 mg total) by mouth every 8 (eight) hours as needed for nausea or vomiting.  Marland Kitchen oxyCODONE-acetaminophen (PERCOCET) 10-325 MG tablet Take 1 tablet by mouth every 6 (six) hours as needed for pain.  . pregabalin (LYRICA) 150 MG capsule  Take 150 mg by mouth 2 (two) times daily.   Marland Kitchen PRESCRIPTION MEDICATION Take 10 mg by mouth 2 (two) times daily. Domperidone 10 mg from [place]  . rosuvastatin (CRESTOR) 10 MG tablet Take 10 mg by mouth every evening.    No facility-administered encounter medications on file as of 11/10/2018.     ALLERGIES:  Allergies  Allergen Reactions  . Demerol Other (See Comments)    "Makes me crazy."     PHYSICAL EXAM:  ECOG Performance status: 1  Vitals:   11/10/18 1400  BP: (!) 124/98  Pulse: 84  Resp: 16  Temp: 98.1 F (36.7 C)  SpO2: 99%   Filed Weights   11/10/18 1400  Weight: 256 lb 2 oz (116.2 kg)    Physical Exam Vitals signs reviewed.  Constitutional:      Appearance: Normal appearance.  Cardiovascular:     Rate and Rhythm: Normal rate and regular rhythm.     Heart sounds: Normal heart sounds.  Pulmonary:     Effort: Pulmonary effort is normal.     Breath sounds: Normal breath sounds.  Abdominal:     General: There is no distension.     Palpations: Abdomen is soft. There is no mass.  Musculoskeletal:        General: No swelling.  Skin:    General: Skin is warm.  Neurological:     General: No focal deficit present.     Mental Status: He is alert and oriented to person, place, and time.  Psychiatric:        Mood and Affect: Mood normal.        Behavior: Behavior normal.      LABORATORY DATA:  I have reviewed  the labs as listed.  CBC    Component Value Date/Time   WBC 3.8 (L) 10/29/2018 1424   RBC 5.24 10/29/2018 1424   RBC 5.24 10/29/2018 1424   HGB 16.1 10/29/2018 1424   HCT 48.7 10/29/2018 1424   PLT 109 (L) 10/29/2018 1424   MCV 92.9 10/29/2018 1424   MCH 30.7 10/29/2018 1424   MCHC 33.1 10/29/2018 1424   RDW 13.1 10/29/2018 1424   LYMPHSABS 1.1 10/29/2018 1424   MONOABS 0.3 10/29/2018 1424   EOSABS 0.1 10/29/2018 1424   BASOSABS 0.0 10/29/2018 1424   CMP Latest Ref Rng & Units 10/29/2018 02/16/2018 07/24/2017  Glucose 70 - 99 mg/dL 95 91  87  BUN 8 - 23 mg/dL 29(H) 21 23(H)  Creatinine 0.61 - 1.24 mg/dL 1.22 1.22 1.18  Sodium 135 - 145 mmol/L 139 140 136  Potassium 3.5 - 5.1 mmol/L 3.9 4.0 4.2  Chloride 98 - 111 mmol/L 105 109 101  CO2 22 - 32 mmol/L 21(L) 26 22  Calcium 8.9 - 10.3 mg/dL 9.5 8.9 10.1  Total Protein 6.5 - 8.1 g/dL 7.9 - 7.8  Total Bilirubin 0.3 - 1.2 mg/dL 1.0 - 0.8  Alkaline Phos 38 - 126 U/L 79 - 65  AST 15 - 41 U/L 30 - 35  ALT 0 - 44 U/L 28 - 25       DIAGNOSTIC IMAGING:  I have independently reviewed the scans and discussed with the patient.   I have reviewed Venita Lick LPN's note and agree with the documentation.  I personally performed a face-to-face visit, made revisions and my assessment and plan is as follows.    ASSESSMENT & PLAN:   Thrombocytopenia ([place]) 1. Moderate thrombocytopenia: - Referral from Dr. Nolon Rod office for decreased platelet count of 80 on CBC done on 10/21/2018.  WBC and hemoglobin were within normal limits.  Normal differential. - Patient does report some easy bruising on the forearms.  Had one episode of nosebleed in 2019 and had to undergo cauterization.  He takes fish oil twice daily. -He had a history of blood transfusion x2 in the past.  He also had tattoos placed in the early 70s. -He did not have any B symptoms.  He worked in [employer].  He also spent at [facility] [employer] for 2 years. - He smoked cigarettes for 10 years, 1 to 2 packs/day, quit in 1982. - We have reviewed his blood work.  He has mild leukopenia with white count of 3.8 and mild thrombocytopenia with platelet count of 109. - LDH was normal.  K27 folic acid and copper levels were normal.  Hepatitis panel was normal.  HIV test was normal.  H. pylori testing was negative. -CT abdomen on 11/08/2018 shows normal-sized spleen with no abdominal or pelvic pathology. -Differential diagnosis for his mild thrombocytopenia is immune mediated thrombocytopenia versus MDS. - Confirmation can be done  by bone marrow aspiration and biopsy.  However we have decided to wait and watch at this time.  If there is a drastic decrease in his platelet count, will consider doing a bone marrow biopsy.    2.  Malignant melanoma: -He had melanoma resected from the left side of the abdomen, at [facility] 10 to 14 years ago.  Reportedly stage III.  He had axillary sentinel lymph node biopsy done.   Total time spent is 25 minutes with more than 50% of the time spent face-to-face discussing scan results, lab results, differential diagnosis and coordination of care.  Orders placed  this encounter:  Orders Placed This Encounter  Procedures  . CBC with Differential  . Lactate dehydrogenase      Derek Jack, MD [facility] [phone]

## 2018-11-10 NOTE — Patient Instructions (Addendum)
[facility] at [facility] Discharge Instructions  You were seen today by Dr. Katragadda. He went over your recent lab results. He will see you back in 3 months for labs and follow up.   Thank you for choosing [facility] at [facility] to provide your oncology and hematology care.  To afford each patient quality time with our provider, please arrive at least 15 minutes before your scheduled appointment time.   If you have a lab appointment with the Cancer Center please come in thru the  Main Entrance and check in at the main information desk  You need to re-schedule your appointment should you arrive 10 or more minutes late.  We strive to give you quality time with our providers, and arriving late affects you and other patients whose appointments are after yours.  Also, if you no show three or more times for appointments you may be dismissed from the clinic at the providers discretion.     Again, thank you for choosing [facility].  Our hope is that these requests will decrease the amount of time that you wait before being seen by our physicians.       _____________________________________________________________  Should you have questions after your visit to [facility], please contact our office at [phone] between the hours of 8:00 a.m. and 4:30 p.m.  Voicemails left after 4:00 p.m. will not be returned until the following business day.  For prescription refill requests, have your pharmacy contact our office and allow 72 hours.    Cancer Center Support Programs:   > Cancer Support Group  2nd Tuesday of the month 1pm-2pm, Journey Room    

## 2018-11-12 DIAGNOSIS — I10 Essential (primary) hypertension: Secondary | ICD-10-CM | POA: Diagnosis not present

## 2018-11-12 DIAGNOSIS — K219 Gastro-esophageal reflux disease without esophagitis: Secondary | ICD-10-CM | POA: Diagnosis not present

## 2018-11-12 DIAGNOSIS — C439 Malignant melanoma of skin, unspecified: Secondary | ICD-10-CM | POA: Diagnosis not present

## 2018-11-12 DIAGNOSIS — D696 Thrombocytopenia, unspecified: Secondary | ICD-10-CM | POA: Diagnosis not present

## 2018-11-12 DIAGNOSIS — Z23 Encounter for immunization: Secondary | ICD-10-CM | POA: Diagnosis not present

## 2018-11-12 DIAGNOSIS — G4700 Insomnia, unspecified: Secondary | ICD-10-CM | POA: Diagnosis not present

## 2018-11-12 DIAGNOSIS — Z1389 Encounter for screening for other disorder: Secondary | ICD-10-CM | POA: Diagnosis not present

## 2018-11-12 DIAGNOSIS — G4733 Obstructive sleep apnea (adult) (pediatric): Secondary | ICD-10-CM | POA: Diagnosis not present

## 2018-11-17 ENCOUNTER — Ambulatory Visit (INDEPENDENT_AMBULATORY_CARE_PROVIDER_SITE_OTHER): Payer: Medicare Other | Admitting: Internal Medicine

## 2018-11-17 ENCOUNTER — Other Ambulatory Visit (INDEPENDENT_AMBULATORY_CARE_PROVIDER_SITE_OTHER): Payer: Self-pay | Admitting: *Deleted

## 2018-11-17 ENCOUNTER — Other Ambulatory Visit: Payer: Self-pay

## 2018-11-17 ENCOUNTER — Encounter (INDEPENDENT_AMBULATORY_CARE_PROVIDER_SITE_OTHER): Payer: Self-pay | Admitting: Internal Medicine

## 2018-11-17 VITALS — BP 146/92 | HR 92 | Temp 98.1°F | Resp 18 | Ht 72.0 in | Wt 257.6 lb

## 2018-11-17 DIAGNOSIS — D696 Thrombocytopenia, unspecified: Secondary | ICD-10-CM

## 2018-11-17 DIAGNOSIS — R531 Weakness: Secondary | ICD-10-CM

## 2018-11-17 DIAGNOSIS — R112 Nausea with vomiting, unspecified: Secondary | ICD-10-CM | POA: Diagnosis not present

## 2018-11-17 DIAGNOSIS — R5381 Other malaise: Secondary | ICD-10-CM | POA: Insufficient documentation

## 2018-11-17 NOTE — Patient Instructions (Signed)
Physician or physician's office will call with results of ultrasound and further recommendations.

## 2018-11-17 NOTE — Progress Notes (Signed)
Presenting complaint;  Nausea vomiting and weakness. Patient complains of having low platelet count.  Database and subjective:  Patient is 68 year old Caucasian male who has chronic GERD complicated by short segment Barrett's esophagus as well as gastroparesis who was doing well when he was last seen in August 2019.  Patient was scheduled to be seen in August this year.  Patient called and requested to be seen earlier because of new symptoms. Patient states he has been experiencing nausea and vomiting off and on for the last 6 weeks.  He is vomiting frequently if not daily.  He vomited earlier today but not after his breakfast which is not large.  He also had vomiting yesterday.  He generally vomits the food that he just ate.  He has not experienced hematemesis or melena.  He is not having abdominal pain either.  He feels his GERD and gastro-symptoms are well controlled with therapy.  He takes domperidone twice daily.  He could not tolerate higher dose because of diarrhea.  His appetite is fair.  His bowels move daily or every other day.  He has gained 15 pounds in last 10 months.  He is not sure why he has gained weight.  He is also having neck pain.  He has seen Dr. Newman Pies.  He was scheduled for myelography. Procedure has been postponed because of thrombocytopenia. He saw Dr. Delton Coombes for thrombocytopenia.  He underwent abdominal pelvic CT on 11/09/2018.  He did not have any changes of cirrhosis or splenomegaly.  He also did not have any bowel abnormalities to account for his symptoms of thrombocytopenia. He had following blood work on 10/29/2018. CBC revealed WBC of 3.8 H&H of 16.1 and 48.7 and platelet count was 109K. Peripheral smear was unremarkable.  He had normal B12 folate levels.  Hepatitis B surface antigen and hepatitis C virus antibody was negative.  ANA and rheumatoid factor were also negative. Serum copper was normal.  HIV testing was negative.  Similarly H. pylori IgM antibody  was negative. Patient says he does not take oxycodone or diazepam often or daily.  He had repeat blood work by Dr. Gerarda Fraction on 10/21/2018 and his platelet count was 80,000 and WBC was 4.1. Bilirubin was 0.5 AP 79 AST 31, ALT 28, total protein 6.5 and albumin was 4.3. Serum calcium was 9.5. Glucose 96, BUN 18, creatinine 1.21 Serum sodium 140, potassium 4.2, chloride 103 and CO2 was 31. TSH 1.510 PSA 1.1.  He states Dr. Gerarda Fraction is arranging for him to see Dr. Drue Stager for another opinion   Current Medications: Outpatient Encounter Medications as of 11/17/2018  Medication Sig  . Aspirin-Acetaminophen-Caffeine (GOODY HEADACHE PO) Take 1-2 packets by mouth daily as needed (headaches).  Marland Kitchen Bioflavonoid Products (ESTER C PO) Take 1 tablet by mouth daily.  . calcium-vitamin D (OSCAL WITH D) 500-200 MG-UNIT per tablet Take 1 tablet by mouth 2 (two) times daily.   . cyclobenzaprine (FLEXERIL) 10 MG tablet Take 1 tablet (10 mg total) by mouth 3 (three) times daily as needed for muscle spasms.  Marland Kitchen dexlansoprazole (DEXILANT) 60 MG capsule Take 60 mg by mouth daily.  . diazepam (VALIUM) 10 MG tablet Take 10 mg by mouth 4 (four) times daily as needed (for back pain).   . fexofenadine (ALLEGRA) 180 MG tablet Take 180 mg by mouth every evening.  . fish oil-omega-3 fatty acids 1000 MG capsule Take 1 g by mouth 2 (two) times daily.  Marland Kitchen lisinopril (PRINIVIL,ZESTRIL) 10 MG tablet Take 10 mg by  mouth daily.  . montelukast (SINGULAIR) 10 MG tablet Take 10 mg by mouth daily.   . Multiple Vitamin (MULITIVITAMIN WITH MINERALS) TABS Take 1 tablet by mouth daily.  . ondansetron (ZOFRAN) 4 MG tablet Take 1 tablet (4 mg total) by mouth every 8 (eight) hours as needed for nausea or vomiting.  . pregabalin (LYRICA) 150 MG capsule Take 150 mg by mouth 2 (two) times daily.   Marland Kitchen PRESCRIPTION MEDICATION Take 10 mg by mouth 2 (two) times daily. Domperidone 10 mg from [place]  . rosuvastatin (CRESTOR) 10 MG tablet Take 10 mg by  mouth every evening.   Marland Kitchen oxyCODONE-acetaminophen (PERCOCET) 10-325 MG tablet Take 1 tablet by mouth every 6 (six) hours as needed for pain.   No facility-administered encounter medications on file as of 11/17/2018.      Objective: Blood pressure (!) 146/92, pulse 92, temperature 98.1 F (36.7 C), temperature source Oral, resp. rate 18, height 6' (1.829 m), weight 257 lb 9.6 oz (116.8 kg). Patient is alert and in no acute distress. Conjunctiva is pink. Sclera is nonicteric Oropharyngeal mucosa is normal. No neck masses or thyromegaly noted. He has scar along the left posterior lateral chest. Cardiac exam with regular rhythm normal S1 and S2. No murmur or gallop noted. Lungs are clear to auscultation. Abdomen is full.  He has upper midline scar.  Bowel sounds are normal.  On palpation abdomen is soft and nontender without organomegaly or masses. No LE edema or clubbing noted.  Labs/studies Results:  CBC Latest Ref Rng & Units 10/29/2018 02/16/2018 07/24/2017  WBC 4.0 - 10.5 K/uL 3.8(L) 4.1 4.3  Hemoglobin 13.0 - 17.0 g/dL 16.1 13.4 15.1  Hematocrit 39.0 - 52.0 % 48.7 41.2 45.5  Platelets 150 - 400 K/uL 109(L) 92(L) 121(L)    CMP Latest Ref Rng & Units 10/29/2018 02/16/2018 07/24/2017  Glucose 70 - 99 mg/dL 95 91 87  BUN 8 - 23 mg/dL 29(H) 21 23(H)  Creatinine 0.61 - 1.24 mg/dL 1.22 1.22 1.18  Sodium 135 - 145 mmol/L 139 140 136  Potassium 3.5 - 5.1 mmol/L 3.9 4.0 4.2  Chloride 98 - 111 mmol/L 105 109 101  CO2 22 - 32 mmol/L 21(L) 26 22  Calcium 8.9 - 10.3 mg/dL 9.5 8.9 10.1  Total Protein 6.5 - 8.1 g/dL 7.9 - 7.8  Total Bilirubin 0.3 - 1.2 mg/dL 1.0 - 0.8  Alkaline Phos 38 - 126 U/L 79 - 65  AST 15 - 41 U/L 30 - 35  ALT 0 - 44 U/L 28 - 25    Hepatic Function Latest Ref Rng & Units 10/29/2018 07/24/2017 05/11/2017  Total Protein 6.5 - 8.1 g/dL 7.9 7.8 7.0  Albumin 3.5 - 5.0 g/dL 4.6 4.3 3.8  AST 15 - 41 U/L 30 35 27  ALT 0 - 44 U/L 28 25 21   Alk Phosphatase 38 - 126 U/L 79 65 56   Total Bilirubin 0.3 - 1.2 mg/dL 1.0 0.8 0.8    Rest of the lab studies as above.  Abdominal pelvic CT from 11/09/2018 reviewed.  Liver and spleen appear to be normal.  He has small sliding hiatal hernia and evidence of prior fundoplication.  Colonic diverticulosis without evidence of diverticulitis.  He has atherosclerotic changes to the aorta.  Assessment:  #1.  Nausea and vomiting of 6 weeks duration.  Patient has chronic GERD as well as gastroparesis and is on dietary measures PPI and promotility agent.  Present symptoms possibly unrelated to these diagnoses.  Need  to rule out gallbladder disease.  #2.  Thrombocytopenia.  Transaminases are normal.  No history of liver disease.  He could still have underlying liver disease despite negative CT.  Dr. Delton Coombes has recommended observation for 3 months before doing a bone marrow.  Patient is interested in second opinion and is planning to see Dr. Tressie Stalker.  #3.  Weakness.  He does not have anemia or electrolyte abnormalities or hypothyroidism.  Weakness would appear to be due to his medications.   Plan:  Upper abdominal ultrasound with elastography. Further recommendations to follow.

## 2018-11-18 ENCOUNTER — Ambulatory Visit (HOSPITAL_COMMUNITY)
Admission: RE | Admit: 2018-11-18 | Discharge: 2018-11-18 | Disposition: A | Discharge status: Home / Self Care | Payer: Medicare Other | Source: Ambulatory Visit | Attending: Internal Medicine | Admitting: Internal Medicine

## 2018-11-18 DIAGNOSIS — D696 Thrombocytopenia, unspecified: Secondary | ICD-10-CM | POA: Diagnosis not present

## 2018-11-18 DIAGNOSIS — R112 Nausea with vomiting, unspecified: Secondary | ICD-10-CM | POA: Diagnosis not present

## 2018-11-18 DIAGNOSIS — R945 Abnormal results of liver function studies: Secondary | ICD-10-CM | POA: Diagnosis not present

## 2018-11-18 DIAGNOSIS — N281 Cyst of kidney, acquired: Secondary | ICD-10-CM | POA: Diagnosis not present

## 2018-11-22 ENCOUNTER — Other Ambulatory Visit (INDEPENDENT_AMBULATORY_CARE_PROVIDER_SITE_OTHER): Payer: Self-pay | Admitting: *Deleted

## 2018-11-22 ENCOUNTER — Other Ambulatory Visit: Payer: Medicare Other

## 2018-11-22 DIAGNOSIS — R112 Nausea with vomiting, unspecified: Secondary | ICD-10-CM

## 2018-11-29 ENCOUNTER — Other Ambulatory Visit: Payer: Self-pay

## 2018-11-29 ENCOUNTER — Encounter (HOSPITAL_COMMUNITY)
Admission: RE | Admit: 2018-11-29 | Discharge: 2018-11-29 | Disposition: A | Discharge status: Home / Self Care | Payer: Medicare Other | Source: Ambulatory Visit | Attending: Internal Medicine | Admitting: Internal Medicine

## 2018-11-29 ENCOUNTER — Encounter (HOSPITAL_COMMUNITY): Payer: Self-pay

## 2018-11-29 DIAGNOSIS — R112 Nausea with vomiting, unspecified: Secondary | ICD-10-CM | POA: Diagnosis not present

## 2018-11-29 MED ORDER — TECHNETIUM TC 99M MEBROFENIN IV KIT
5.0000 | PACK | Freq: Once | INTRAVENOUS | Status: AC | PRN
  Administered 2018-11-29: 5.1 via INTRAVENOUS

## 2018-12-06 ENCOUNTER — Other Ambulatory Visit (INDEPENDENT_AMBULATORY_CARE_PROVIDER_SITE_OTHER): Payer: Self-pay | Admitting: *Deleted

## 2018-12-06 DIAGNOSIS — R112 Nausea with vomiting, unspecified: Secondary | ICD-10-CM

## 2018-12-08 ENCOUNTER — Other Ambulatory Visit: Payer: Self-pay

## 2018-12-08 ENCOUNTER — Encounter (HOSPITAL_COMMUNITY): Payer: Self-pay

## 2018-12-08 ENCOUNTER — Encounter (HOSPITAL_COMMUNITY)
Admission: RE | Admit: 2018-12-08 | Discharge: 2018-12-08 | Disposition: A | Discharge status: Home / Self Care | Payer: Medicare Other | Source: Ambulatory Visit | Attending: Internal Medicine | Admitting: Internal Medicine

## 2018-12-08 DIAGNOSIS — R112 Nausea with vomiting, unspecified: Secondary | ICD-10-CM | POA: Diagnosis not present

## 2018-12-08 DIAGNOSIS — K219 Gastro-esophageal reflux disease without esophagitis: Secondary | ICD-10-CM | POA: Diagnosis not present

## 2018-12-08 MED ORDER — TECHNETIUM TC 99M SULFUR COLLOID
2.0000 | Freq: Once | INTRAVENOUS | Status: AC | PRN
  Administered 2018-12-08: 2.1 via ORAL

## 2018-12-12 ENCOUNTER — Other Ambulatory Visit (INDEPENDENT_AMBULATORY_CARE_PROVIDER_SITE_OTHER): Payer: Self-pay | Admitting: Internal Medicine

## 2018-12-12 MED ORDER — METOCLOPRAMIDE HCL 10 MG PO TABS: 10.0000 mg | ORAL_TABLET | Freq: Three times a day (TID) | ORAL | 0 refills | Status: DC

## 2018-12-16 DIAGNOSIS — R0600 Dyspnea, unspecified: Secondary | ICD-10-CM | POA: Diagnosis not present

## 2018-12-16 DIAGNOSIS — I10 Essential (primary) hypertension: Secondary | ICD-10-CM | POA: Diagnosis not present

## 2018-12-16 DIAGNOSIS — I7789 Other specified disorders of arteries and arterioles: Secondary | ICD-10-CM | POA: Diagnosis not present

## 2018-12-16 DIAGNOSIS — R55 Syncope and collapse: Secondary | ICD-10-CM | POA: Diagnosis not present

## 2018-12-16 DIAGNOSIS — I2510 Atherosclerotic heart disease of native coronary artery without angina pectoris: Secondary | ICD-10-CM | POA: Diagnosis not present

## 2019-01-03 DIAGNOSIS — I517 Cardiomegaly: Secondary | ICD-10-CM | POA: Diagnosis not present

## 2019-01-03 DIAGNOSIS — I351 Nonrheumatic aortic (valve) insufficiency: Secondary | ICD-10-CM | POA: Diagnosis not present

## 2019-01-04 ENCOUNTER — Ambulatory Visit (INDEPENDENT_AMBULATORY_CARE_PROVIDER_SITE_OTHER): Payer: Medicare Other | Admitting: Internal Medicine

## 2019-01-04 ENCOUNTER — Other Ambulatory Visit: Payer: Self-pay

## 2019-01-04 ENCOUNTER — Encounter (INDEPENDENT_AMBULATORY_CARE_PROVIDER_SITE_OTHER): Payer: Self-pay | Admitting: Internal Medicine

## 2019-01-04 VITALS — BP 136/85 | HR 69 | Temp 98.0°F | Resp 18 | Ht 72.0 in | Wt 236.8 lb

## 2019-01-04 DIAGNOSIS — K3184 Gastroparesis: Secondary | ICD-10-CM

## 2019-01-04 DIAGNOSIS — K219 Gastro-esophageal reflux disease without esophagitis: Secondary | ICD-10-CM | POA: Diagnosis not present

## 2019-01-04 MED ORDER — MOTEGRITY 1 MG PO TABS: 1.0000 mg | ORAL_TABLET | Freq: Every day | ORAL | 5 refills | Status: DC

## 2019-01-04 MED ORDER — METOCLOPRAMIDE HCL 10 MG PO TABS: 10.0000 mg | ORAL_TABLET | Freq: Two times a day (BID) | ORAL | 2 refills | Status: DC | PRN

## 2019-01-04 NOTE — Patient Instructions (Signed)
Take Motegrity 1 mg by mouth every morning. Use metoclopramide/Reglan 10 mg before evening meal or on as-needed basis daily dose not to exceed 20 mg/day.

## 2019-01-04 NOTE — Progress Notes (Signed)
Presenting complaint;  Follow-up for GERD and gastroparesis.  Database and subjective:  Patient is 68 year old Caucasian male who has chronic GERD complicated by short segment Barrett's esophagus who is last biopsy in September 2019 was negative for Barrett's.  He also has a history of gastroparesis.  He had been doing well until few months ago when he began to have nausea and vomiting.  He reported no change in his diet or medications.  He was also concerned about thrombocytopenia and wondered if he had liver disease.  Ultrasound was negative for cholelithiasis thickened gallbladder wall or dilated bile duct.  Elastography revealed [place] score consistent with F0/F1 disease.  He was therefore told he did not have any cirrhosis.  He did not feel any better with doubling of domperidone dose.  HIDA scan revealed prompt visualization of gallbladder and EF was 51%. Gastric emptying study was performed after challenge with domperidone.  He was noted to have severe gastroparesis.  91% of activity was in the stomach at conclusion of study. Patient was advised to discontinue domperidone and he was begun on metoclopramide 10 mg before each meal.  He feels better.  He felt restless for the first 4 days on metoclopramide but that after his restlessness disappeared.  He has had some nausea but no more vomiting.  However he did have some nausea and heaving this morning after eating his breakfast.  He is not having any other side effects.  He states he has chronic insomnia and he also has sleep apnea and has not started using CPAP yet.  He has lost 21 pounds in the last 6 weeks.  He feels weight loss is voluntary.  He is drinking 6-8 bottles of water a day.  He is quit drinking carbonated drinks.  He takes oxycodone once in a while and Goody's powder may be once every couple of weeks for headache.  His bowels remain irregular.  He can go as many as 4 days without having a bowel movement.  However he does not feel  bloated or constipated.  He has stopped Lyrica on his own because of side effects.    Current Medications: Outpatient Encounter Medications as of 01/04/2019  Medication Sig  . Aspirin-Acetaminophen-Caffeine (GOODY HEADACHE PO) Take 1-2 packets by mouth daily as needed (headaches).  Marland Kitchen Bioflavonoid Products (ESTER C PO) Take 1 tablet by mouth daily.  . calcium-vitamin D (OSCAL WITH D) 500-200 MG-UNIT per tablet Take 1 tablet by mouth 2 (two) times daily.   . cyclobenzaprine (FLEXERIL) 10 MG tablet Take 1 tablet (10 mg total) by mouth 3 (three) times daily as needed for muscle spasms.  Marland Kitchen dexlansoprazole (DEXILANT) 60 MG capsule Take 60 mg by mouth daily.  . diazepam (VALIUM) 10 MG tablet Take 10 mg by mouth 4 (four) times daily as needed (for back pain).   . fexofenadine (ALLEGRA) 180 MG tablet Take 180 mg by mouth every evening.  . fish oil-omega-3 fatty acids 1000 MG capsule Take 1 g by mouth 2 (two) times daily.  Marland Kitchen lisinopril (PRINIVIL,ZESTRIL) 10 MG tablet Take 10 mg by mouth daily.  . metoCLOPramide (REGLAN) 10 MG tablet Take 1 tablet (10 mg total) by mouth 3 (three) times daily before meals.  . montelukast (SINGULAIR) 10 MG tablet Take 10 mg by mouth daily.   . Multiple Vitamin (MULITIVITAMIN WITH MINERALS) TABS Take 1 tablet by mouth daily.  . ondansetron (ZOFRAN) 4 MG tablet Take 1 tablet (4 mg total) by mouth every 8 (eight) hours as needed  for nausea or vomiting.  Marland Kitchen oxyCODONE-acetaminophen (PERCOCET) 10-325 MG tablet Take 1 tablet by mouth every 6 (six) hours as needed for pain.  . rosuvastatin (CRESTOR) 10 MG tablet Take 10 mg by mouth every evening.   . [DISCONTINUED] pregabalin (LYRICA) 150 MG capsule Take 150 mg by mouth 2 (two) times daily.    No facility-administered encounter medications on file as of 01/04/2019.     Objective: Blood pressure 136/85, pulse 69, temperature 98 F (36.7 C), temperature source Oral, resp. rate 18, height 6' (1.829 m), weight 236 lb 12.8 oz (107.4  kg).  Conjunctiva is pink. Sclera is nonicteric Oropharyngeal mucosa is normal. No neck masses or thyromegaly noted. Cardiac exam with regular rhythm normal S1 and S2. No murmur or gallop noted. Lungs are clear to auscultation. Abdomen is full with upper midline scar.  Abdomen is soft and nontender with organomegaly or masses. No LE edema or clubbing noted.  Labs/studies Results:  CBC Latest Ref Rng & Units 10/29/2018 02/16/2018 07/24/2017  WBC 4.0 - 10.5 K/uL 3.8(L) 4.1 4.3  Hemoglobin 13.0 - 17.0 g/dL 16.1 13.4 15.1  Hematocrit 39.0 - 52.0 % 48.7 41.2 45.5  Platelets 150 - 400 K/uL 109(L) 92(L) 121(L)    CMP Latest Ref Rng & Units 10/29/2018 02/16/2018 07/24/2017  Glucose 70 - 99 mg/dL 95 91 87  BUN 8 - 23 mg/dL 29(H) 21 23(H)  Creatinine 0.61 - 1.24 mg/dL 1.22 1.22 1.18  Sodium 135 - 145 mmol/L 139 140 136  Potassium 3.5 - 5.1 mmol/L 3.9 4.0 4.2  Chloride 98 - 111 mmol/L 105 109 101  CO2 22 - 32 mmol/L 21(L) 26 22  Calcium 8.9 - 10.3 mg/dL 9.5 8.9 10.1  Total Protein 6.5 - 8.1 g/dL 7.9 - 7.8  Total Bilirubin 0.3 - 1.2 mg/dL 1.0 - 0.8  Alkaline Phos 38 - 126 U/L 79 - 65  AST 15 - 41 U/L 30 - 35  ALT 0 - 44 U/L 28 - 25    Hepatic Function Latest Ref Rng & Units 10/29/2018 07/24/2017 05/11/2017  Total Protein 6.5 - 8.1 g/dL 7.9 7.8 7.0  Albumin 3.5 - 5.0 g/dL 4.6 4.3 3.8  AST 15 - 41 U/L 30 35 27  ALT 0 - 44 U/L _0 Alk Phosphatase 38 - 126 U/L 79 65 56  Total Bilirubin 0.3 - 1.2 mg/dL 1.0 0.8 0.8      Assessment:  #1.  Gastroparesis.  Gastroparesis most likely secondary to vagal injury because of multiple surgeries for refractory GERD disease.  He he did well for a while on domperidone and it finally stopped working.  He seemed to be doing much better with metoclopramide but this is not a long-term therapy.  We will try him on prucalopride which does have beneficial effect on gastric motility.  However this would be off label use of this medication.  It might even help him  with his bowel movements.  #2.  Chronic GERD.  He is status post 3 surgeries.  Last surgery was via thoracic approach.  Symptom control is satisfactory with dietary measures and dexlansoprazole for which he is paying $100 a month but other PPIs have not worked.  #3.  Weight loss appears to be voluntary.  #4.  Thrombocytopenia does not appear to be due to liver disease.   Plan:  Use metoclopramide 10 mg twice daily as needed. Begin prucalopride 1 mg p.o. every morning. Continued dietary measures as before. Patient will return for office  visit in 3 months.

## 2019-01-17 DIAGNOSIS — R55 Syncope and collapse: Secondary | ICD-10-CM | POA: Diagnosis not present

## 2019-01-24 DIAGNOSIS — I471 Supraventricular tachycardia: Secondary | ICD-10-CM | POA: Diagnosis not present

## 2019-01-24 DIAGNOSIS — R55 Syncope and collapse: Secondary | ICD-10-CM | POA: Diagnosis not present

## 2019-01-26 ENCOUNTER — Other Ambulatory Visit (INDEPENDENT_AMBULATORY_CARE_PROVIDER_SITE_OTHER): Payer: Self-pay | Admitting: Internal Medicine

## 2019-01-31 DIAGNOSIS — K219 Gastro-esophageal reflux disease without esophagitis: Secondary | ICD-10-CM | POA: Diagnosis not present

## 2019-01-31 DIAGNOSIS — I10 Essential (primary) hypertension: Secondary | ICD-10-CM | POA: Diagnosis not present

## 2019-01-31 DIAGNOSIS — M1991 Primary osteoarthritis, unspecified site: Secondary | ICD-10-CM | POA: Diagnosis not present

## 2019-01-31 DIAGNOSIS — R5383 Other fatigue: Secondary | ICD-10-CM | POA: Diagnosis not present

## 2019-02-01 ENCOUNTER — Ambulatory Visit (INDEPENDENT_AMBULATORY_CARE_PROVIDER_SITE_OTHER): Payer: Medicare Other | Admitting: Internal Medicine

## 2019-02-01 DIAGNOSIS — I519 Heart disease, unspecified: Secondary | ICD-10-CM | POA: Diagnosis not present

## 2019-02-01 DIAGNOSIS — I119 Hypertensive heart disease without heart failure: Secondary | ICD-10-CM | POA: Diagnosis not present

## 2019-02-01 DIAGNOSIS — E785 Hyperlipidemia, unspecified: Secondary | ICD-10-CM | POA: Diagnosis not present

## 2019-02-01 DIAGNOSIS — I10 Essential (primary) hypertension: Secondary | ICD-10-CM | POA: Diagnosis not present

## 2019-02-01 DIAGNOSIS — I2510 Atherosclerotic heart disease of native coronary artery without angina pectoris: Secondary | ICD-10-CM | POA: Diagnosis not present

## 2019-02-01 DIAGNOSIS — I7789 Other specified disorders of arteries and arterioles: Secondary | ICD-10-CM | POA: Diagnosis not present

## 2019-02-08 ENCOUNTER — Inpatient Hospital Stay (HOSPITAL_COMMUNITY): Payer: Medicare Other | Attending: Hematology

## 2019-02-08 ENCOUNTER — Other Ambulatory Visit: Payer: Self-pay

## 2019-02-08 DIAGNOSIS — M545 Low back pain: Secondary | ICD-10-CM | POA: Insufficient documentation

## 2019-02-08 DIAGNOSIS — K219 Gastro-esophageal reflux disease without esophagitis: Secondary | ICD-10-CM | POA: Diagnosis not present

## 2019-02-08 DIAGNOSIS — R0602 Shortness of breath: Secondary | ICD-10-CM | POA: Diagnosis not present

## 2019-02-08 DIAGNOSIS — Z808 Family history of malignant neoplasm of other organs or systems: Secondary | ICD-10-CM | POA: Diagnosis not present

## 2019-02-08 DIAGNOSIS — Z8249 Family history of ischemic heart disease and other diseases of the circulatory system: Secondary | ICD-10-CM | POA: Diagnosis not present

## 2019-02-08 DIAGNOSIS — Z87891 Personal history of nicotine dependence: Secondary | ICD-10-CM | POA: Diagnosis not present

## 2019-02-08 DIAGNOSIS — D696 Thrombocytopenia, unspecified: Secondary | ICD-10-CM

## 2019-02-08 DIAGNOSIS — Z23 Encounter for immunization: Secondary | ICD-10-CM | POA: Insufficient documentation

## 2019-02-08 DIAGNOSIS — Z79899 Other long term (current) drug therapy: Secondary | ICD-10-CM | POA: Diagnosis not present

## 2019-02-08 DIAGNOSIS — C4359 Malignant melanoma of other part of trunk: Secondary | ICD-10-CM | POA: Diagnosis not present

## 2019-02-08 LAB — CBC WITH DIFFERENTIAL/PLATELET
Abs Immature Granulocytes: 0.02 10*3/uL (ref 0.00–0.07)
Basophils Absolute: 0 10*3/uL (ref 0.0–0.1)
Basophils Relative: 0 %
Eosinophils Absolute: 0 10*3/uL (ref 0.0–0.5)
Eosinophils Relative: 1 %
HCT: 43.9 % (ref 39.0–52.0)
Hemoglobin: 14 g/dL (ref 13.0–17.0)
Immature Granulocytes: 1 %
Lymphocytes Relative: 37 %
Lymphs Abs: 1.3 10*3/uL (ref 0.7–4.0)
MCH: 30.7 pg (ref 26.0–34.0)
MCHC: 31.9 g/dL (ref 30.0–36.0)
MCV: 96.3 fL (ref 80.0–100.0)
Monocytes Absolute: 0.4 10*3/uL (ref 0.1–1.0)
Monocytes Relative: 11 %
Neutro Abs: 1.7 10*3/uL (ref 1.7–7.7)
Neutrophils Relative %: 50 %
Platelets: 84 10*3/uL — ABNORMAL LOW (ref 150–400)
RBC: 4.56 MIL/uL (ref 4.22–5.81)
RDW: 13.2 % (ref 11.5–15.5)
WBC: 3.4 10*3/uL — ABNORMAL LOW (ref 4.0–10.5)
nRBC: 0 % (ref 0.0–0.2)

## 2019-02-08 LAB — LACTATE DEHYDROGENASE: LDH: 149 U/L (ref 98–192)

## 2019-02-09 ENCOUNTER — Other Ambulatory Visit (HOSPITAL_COMMUNITY): Payer: Medicare Other

## 2019-02-09 DIAGNOSIS — R9431 Abnormal electrocardiogram [ECG] [EKG]: Secondary | ICD-10-CM | POA: Diagnosis not present

## 2019-02-09 DIAGNOSIS — I501 Left ventricular failure: Secondary | ICD-10-CM | POA: Diagnosis not present

## 2019-02-09 DIAGNOSIS — Z87891 Personal history of nicotine dependence: Secondary | ICD-10-CM | POA: Diagnosis not present

## 2019-02-09 DIAGNOSIS — E785 Hyperlipidemia, unspecified: Secondary | ICD-10-CM | POA: Diagnosis not present

## 2019-02-09 DIAGNOSIS — I119 Hypertensive heart disease without heart failure: Secondary | ICD-10-CM | POA: Diagnosis not present

## 2019-02-09 DIAGNOSIS — I4510 Unspecified right bundle-branch block: Secondary | ICD-10-CM | POA: Diagnosis not present

## 2019-02-09 DIAGNOSIS — I2510 Atherosclerotic heart disease of native coronary artery without angina pectoris: Secondary | ICD-10-CM | POA: Diagnosis not present

## 2019-02-09 DIAGNOSIS — R61 Generalized hyperhidrosis: Secondary | ICD-10-CM | POA: Diagnosis not present

## 2019-02-10 DIAGNOSIS — I4519 Other right bundle-branch block: Secondary | ICD-10-CM | POA: Diagnosis not present

## 2019-02-10 DIAGNOSIS — R9431 Abnormal electrocardiogram [ECG] [EKG]: Secondary | ICD-10-CM | POA: Diagnosis not present

## 2019-02-16 ENCOUNTER — Ambulatory Visit (HOSPITAL_COMMUNITY): Payer: Medicare Other | Admitting: Hematology

## 2019-02-16 DIAGNOSIS — M542 Cervicalgia: Secondary | ICD-10-CM | POA: Diagnosis not present

## 2019-02-16 DIAGNOSIS — M546 Pain in thoracic spine: Secondary | ICD-10-CM | POA: Diagnosis not present

## 2019-02-16 DIAGNOSIS — I719 Aortic aneurysm of unspecified site, without rupture: Secondary | ICD-10-CM | POA: Diagnosis not present

## 2019-02-16 DIAGNOSIS — R52 Pain, unspecified: Secondary | ICD-10-CM | POA: Diagnosis not present

## 2019-02-16 DIAGNOSIS — M791 Myalgia, unspecified site: Secondary | ICD-10-CM | POA: Diagnosis not present

## 2019-02-16 DIAGNOSIS — M25561 Pain in right knee: Secondary | ICD-10-CM | POA: Diagnosis not present

## 2019-02-16 DIAGNOSIS — Z885 Allergy status to narcotic agent status: Secondary | ICD-10-CM | POA: Diagnosis not present

## 2019-02-16 DIAGNOSIS — M898X9 Other specified disorders of bone, unspecified site: Secondary | ICD-10-CM | POA: Diagnosis not present

## 2019-02-16 DIAGNOSIS — Z77098 Contact with and (suspected) exposure to other hazardous, chiefly nonmedicinal, chemicals: Secondary | ICD-10-CM | POA: Diagnosis not present

## 2019-02-16 DIAGNOSIS — M25562 Pain in left knee: Secondary | ICD-10-CM | POA: Diagnosis not present

## 2019-02-19 DIAGNOSIS — G4733 Obstructive sleep apnea (adult) (pediatric): Secondary | ICD-10-CM | POA: Diagnosis not present

## 2019-02-22 ENCOUNTER — Inpatient Hospital Stay (HOSPITAL_BASED_OUTPATIENT_CLINIC_OR_DEPARTMENT_OTHER): Payer: Medicare Other | Admitting: Hematology

## 2019-02-22 ENCOUNTER — Encounter (HOSPITAL_COMMUNITY): Payer: Self-pay | Admitting: Hematology

## 2019-02-22 ENCOUNTER — Other Ambulatory Visit: Payer: Self-pay

## 2019-02-22 VITALS — BP 152/85 | HR 68 | Temp 97.0°F | Resp 16 | Wt 241.0 lb

## 2019-02-22 DIAGNOSIS — Z808 Family history of malignant neoplasm of other organs or systems: Secondary | ICD-10-CM | POA: Diagnosis not present

## 2019-02-22 DIAGNOSIS — Z79899 Other long term (current) drug therapy: Secondary | ICD-10-CM | POA: Diagnosis not present

## 2019-02-22 DIAGNOSIS — C4359 Malignant melanoma of other part of trunk: Secondary | ICD-10-CM | POA: Diagnosis not present

## 2019-02-22 DIAGNOSIS — Z8249 Family history of ischemic heart disease and other diseases of the circulatory system: Secondary | ICD-10-CM | POA: Diagnosis not present

## 2019-02-22 DIAGNOSIS — M545 Low back pain: Secondary | ICD-10-CM | POA: Diagnosis not present

## 2019-02-22 DIAGNOSIS — Z23 Encounter for immunization: Secondary | ICD-10-CM | POA: Diagnosis not present

## 2019-02-22 DIAGNOSIS — K219 Gastro-esophageal reflux disease without esophagitis: Secondary | ICD-10-CM | POA: Diagnosis not present

## 2019-02-22 DIAGNOSIS — R0602 Shortness of breath: Secondary | ICD-10-CM | POA: Diagnosis not present

## 2019-02-22 DIAGNOSIS — D696 Thrombocytopenia, unspecified: Secondary | ICD-10-CM

## 2019-02-22 DIAGNOSIS — Z87891 Personal history of nicotine dependence: Secondary | ICD-10-CM | POA: Diagnosis not present

## 2019-02-22 MED ORDER — INFLUENZA VAC A&B SA ADJ QUAD 0.5 ML IM PRSY
0.5000 mL | PREFILLED_SYRINGE | Freq: Once | INTRAMUSCULAR | Status: AC
  Administered 2019-02-22: 0.5 mL via INTRAMUSCULAR
  Filled 2019-02-22: qty 0.5

## 2019-02-22 NOTE — Patient Instructions (Signed)
[facility] at [facility] Discharge Instructions  You were seen today by Dr. Katragadda. He went over your recent lab results. He will schedule you for a bone marrow biopsy in [place]. He will see you back in 4 weeks for labs and follow up.   Thank you for choosing [facility] at [facility] to provide your oncology and hematology care.  To afford each patient quality time with our provider, please arrive at least 15 minutes before your scheduled appointment time.   If you have a lab appointment with the Cancer Center please come in thru the  Main Entrance and check in at the main information desk  You need to re-schedule your appointment should you arrive 10 or more minutes late.  We strive to give you quality time with our providers, and arriving late affects you and other patients whose appointments are after yours.  Also, if you no show three or more times for appointments you may be dismissed from the clinic at the providers discretion.     Again, thank you for choosing [facility].  Our hope is that these requests will decrease the amount of time that you wait before being seen by our physicians.       _____________________________________________________________  Should you have questions after your visit to [facility], please contact our office at [phone] between the hours of 8:00 a.m. and 4:30 p.m.  Voicemails left after 4:00 p.m. will not be returned until the following business day.  For prescription refill requests, have your pharmacy contact our office and allow 72 hours.    Cancer Center Support Programs:   > Cancer Support Group  2nd Tuesday of the month 1pm-2pm, Journey Room    

## 2019-02-22 NOTE — Progress Notes (Signed)
[name], [name]   CLINIC:  Medical Oncology/Hematology  PCP:  [name], [name] [id number] [phone]   REASON FOR VISIT:  Follow-up for thrombocytopenia.     INTERVAL HISTORY:  [name] 68 y.o. male seen for follow-up of thrombocytopenia.  He had blood drawn on 02/08/2019.  In the interim few months, he denies any fevers, night sweats or weight loss.  No nosebleeds or other bleeding reported.  Appetite is 100%.  Energy levels are 25%.  Pain in the lower back is rated as 5 out of 10 and is stable.  Easy bruising reported.  Shortness of breath on exertion is also stable.  No ER visits or hospitalizations.  REVIEW OF SYSTEMS:  Review of Systems  Respiratory: Positive for shortness of breath.   Hematological: Bruises/bleeds easily.  All other systems reviewed and are negative.    PAST MEDICAL/SURGICAL HISTORY:  Past Medical History:  Diagnosis Date  . Anxiety    patient denies  . Aortic aneurysm (HCC)    ascending  . Arthritis   . Barrett esophagus   . Blood transfusion   . Cancer ([name]) 2007   melanoma stage 3 stomach bx  . Coronary artery disease    mild by 09/08/13 cath HPR  . GERD (gastroesophageal reflux disease)    barretts esophagus  . H/O hiatal hernia   . Headache(784.0)   . Hypercholesteremia   . Hypertension   . PONV (postoperative nausea and vomiting)    trouble breathing after last surgery (at [facility])  . Shingles    10  . Sleep apnea    not tested  . Tumor liver    no problems   Past Surgical History:  Procedure Laterality Date  . ABDOMINAL SURGERY     x3  . back surgeries     x 9  . BACK SURGERY     x8  . BIOPSY N/A 03/28/2013   Procedure: Distal Esophageal Biopsy;  Surgeon: Rogene Houston, MD;  Location: [facility];  Service: Endoscopy;  Laterality: N/A;  . BIOPSY  05/30/2016   Procedure: BIOPSY;  Surgeon: Rogene Houston, MD;  [facility];   Service: Endoscopy;;  . BIOPSY  02/19/2018   Procedure: BIOPSY;  Surgeon: Rogene Houston, MD;  [facility];  Service: Endoscopy;;  Barret's esophagus  . CHEST SURGERY     lung incisional hernia for hernia  . COLONOSCOPY WITH PROPOFOL N/A 02/19/2018   Procedure: COLONOSCOPY WITH PROPOFOL;  Surgeon: Rogene Houston, MD;  [facility];  Service: Endoscopy;  Laterality: N/A;  1:00  . ESOPHAGOGASTRODUODENOSCOPY (EGD) WITH PROPOFOL N/A 03/28/2013   Procedure: ESOPHAGOGASTRODUODENOSCOPY (EGD) WITH PROPOFOL;  Surgeon: Rogene Houston, MD;  Location: [facility];  Service: Endoscopy;  Laterality: N/A;  GE junction @ 38, proximal  margin _0   . ESOPHAGOGASTRODUODENOSCOPY (EGD) WITH PROPOFOL N/A 05/30/2016   Procedure: ESOPHAGOGASTRODUODENOSCOPY (EGD) WITH PROPOFOL;  Surgeon: Rogene Houston, MD;  [facility];  Service: Endoscopy;  Laterality: N/A;  955  . ESOPHAGOGASTRODUODENOSCOPY (EGD) WITH PROPOFOL N/A 02/19/2018   Procedure: ESOPHAGOGASTRODUODENOSCOPY (EGD) WITH PROPOFOL;  Surgeon: Rogene Houston, MD;  [facility];  Service: Endoscopy;  Laterality: N/A;  . HEMORRHOID SURGERY     x2  . HERNIA REPAIR    . HIATAL HERNIA REPAIR    . JOINT REPLACEMENT     lft knee  . KNEE ARTHROPLASTY  lft  . KNEE ARTHROSCOPY Right   . LEG SURGERY     x4 crushed   . LITHOTRIPSY    . MELANOMA EXCISION     lft abdomen  . NECK SURGERY  2012  . POLYPECTOMY  02/19/2018   Procedure: POLYPECTOMY;  Surgeon: Rogene Houston, MD;  [facility];  Service: Endoscopy;;  proximal transverse colon (CB x1), descending colon (CBx3)  . POSTERIOR CERVICAL FUSION/FORAMINOTOMY  08/18/2011   Procedure: POSTERIOR CERVICAL FUSION/FORAMINOTOMY LEVEL 3;  Surgeon: Ophelia Charter, MD;  Location: [place] [facility];  Service: Neurosurgery;  Laterality: N/A;  Cervical three-four, four-five, five-six posterior cervical  fusion with instrumentation; Right cervical three-four, four-five  laminotomy  . REVISION TOTAL HIP ARTHROPLASTY Left   . SHOULDER ARTHROSCOPY     rt bith defect , spur, rotator cuff  . SPINAL CORD STIMULATOR IMPLANT     rt hip  . SPINAL CORD STIMULATOR INSERTION N/A 11/03/2016   Procedure: LUMBAR SPINAL CORD STIMULATOR REVISION;  Surgeon: Newman Pies, MD;  Location: [place];  Service: Neurosurgery;  Laterality: N/A;  . THYROID LOBECTOMY     rt     SOCIAL HISTORY:  Social History   Socioeconomic History  . Marital status: Married    Spouse name: Not on file  . Number of children: Not on file  . Years of education: Not on file  . Highest education level: Not on file  Occupational History  . Occupation: retired  Scientific laboratory technician  . Financial resource strain: Not on file  . Food insecurity    Worry: Not on file    Inability: Not on file  . Transportation needs    Medical: Not on file    Non-medical: Not on file  Tobacco Use  . Smoking status: Former Smoker    Packs/day: 4.00    Years: 10.00    Pack years: 40.00    Types: Cigarettes    Quit date: 08/12/1980    Years since quitting: 38.5  . Smokeless tobacco: Never Used  . Tobacco comment: occ wine  Substance and Sexual Activity  . Alcohol use: Yes    Alcohol/week: 0.0 standard drinks    Comment: rarely  . Drug use: No  . Sexual activity: Yes    Birth control/protection: None  Lifestyle  . Physical activity    Days per week: Not on file    Minutes per session: Not on file  . Stress: Not on file  Relationships  . Social Herbalist on phone: Not on file    Gets together: Not on file    Attends religious service: Not on file    Active member of club or organization: Not on file    Attends meetings of clubs or organizations: Not on file    Relationship status: Not on file  . Intimate partner violence    Fear of current or ex partner: Not on file    Emotionally abused: Not on file    Physically abused: Not on file    Forced sexual activity: Not on file  Other Topics  Concern  . Not on file  Social History Narrative  . Not on file    FAMILY HISTORY:  Family History  Problem Relation Age of Onset  . Heart disease Father   . Aneurysm Mother   . Aneurysm Sister   . Heart disease Paternal Grandmother   . Heart disease Paternal Grandfather   . Heart disease Maternal Grandfather   .  Heart disease Brother        heart transplant  . Aneurysm Other        pt states several members have aortic aneurysm  . Skin cancer Other        several members  . Lymphoma Brother   . Aneurysm Maternal Grandmother     CURRENT MEDICATIONS:  Outpatient Encounter Medications as of 02/22/2019  Medication Sig Note  . aspirin EC 81 MG tablet Take by mouth.   Marland Kitchen Bioflavonoid Products (ESTER C PO) Take 1 tablet by mouth daily.   . calcium-vitamin D (OSCAL WITH D) 500-200 MG-UNIT per tablet Take 1 tablet by mouth 2 (two) times daily.    . cyclobenzaprine (FLEXERIL) 10 MG tablet Take 1 tablet (10 mg total) by mouth 3 (three) times daily as needed for muscle spasms.   Marland Kitchen dexlansoprazole (DEXILANT) 60 MG capsule Take 60 mg by mouth daily.   . fexofenadine (ALLEGRA) 180 MG tablet Take 180 mg by mouth every evening.   . fish oil-omega-3 fatty acids 1000 MG capsule Take 1 g by mouth 2 (two) times daily.   Marland Kitchen lisinopril (PRINIVIL,ZESTRIL) 10 MG tablet Take 10 mg by mouth daily.   . metoCLOPramide (REGLAN) 10 MG tablet TAKE ONE TABLET BY MOUTH TWICE A DAY AS NEEDED FOR NAUSEA   . montelukast (SINGULAIR) 10 MG tablet Take 10 mg by mouth daily.    . Multiple Vitamin (MULITIVITAMIN WITH MINERALS) TABS Take 1 tablet by mouth daily.   . rosuvastatin (CRESTOR) 10 MG tablet Take 10 mg by mouth every evening.    . Aspirin-Acetaminophen-Caffeine (GOODY HEADACHE PO) Take 1-2 packets by mouth daily as needed (headaches).   . clopidogrel (PLAVIX) 75 MG tablet TAKE 1 TABLET BY MOUTH ONCE DAILY START DATE 9 10 2020   . diazepam (VALIUM) 10 MG tablet Take 10 mg by mouth 4 (four) times daily as  needed (for back pain).    . ondansetron (ZOFRAN) 4 MG tablet Take 1 tablet (4 mg total) by mouth every 8 (eight) hours as needed for nausea or vomiting. (Patient not taking: Reported on 02/22/2019) 11/17/2018: AS NEEDED.  Marland Kitchen oxyCODONE-acetaminophen (PERCOCET) 10-325 MG tablet Take 1 tablet by mouth every 6 (six) hours as needed for pain.   Marland Kitchen Prucalopride Succinate (MOTEGRITY) 1 MG TABS Take 1 mg by mouth daily before breakfast.   . [EXPIRED] influenza vaccine adjuvanted (FLUAD) injection 0.5 mL     No facility-administered encounter medications on file as of 02/22/2019.     ALLERGIES:  Allergies  Allergen Reactions  . Demerol Other (See Comments)    "Makes me crazy."     PHYSICAL EXAM:  ECOG Performance status: 1  Vitals:   02/22/19 0900  BP: (!) 152/85  Pulse: 68  Resp: 16  Temp: (!) 97 F (36.1 C)  SpO2: 98%   Filed Weights   02/22/19 0900  Weight: 241 lb (109.3 kg)    Physical Exam Vitals signs reviewed.  Constitutional:      Appearance: Normal appearance.  Cardiovascular:     Rate and Rhythm: Normal rate and regular rhythm.     Heart sounds: Normal heart sounds.  Pulmonary:     Effort: Pulmonary effort is normal.     Breath sounds: Normal breath sounds.  Abdominal:     General: There is no distension.     Palpations: Abdomen is soft. There is no mass.  Musculoskeletal:        General: No swelling.  Skin:  General: Skin is warm.  Neurological:     General: No focal deficit present.     Mental Status: He is alert and oriented to person, place, and time.  Psychiatric:        Mood and Affect: Mood normal.        Behavior: Behavior normal.      LABORATORY DATA:  I have reviewed the labs as listed.  CBC    Component Value Date/Time   WBC 3.4 (L) 02/08/2019 0925   RBC 4.56 02/08/2019 0925   HGB 14.0 02/08/2019 0925   HCT 43.9 02/08/2019 0925   PLT 84 (L) 02/08/2019 0925   MCV 96.3 02/08/2019 0925   MCH 30.7 02/08/2019 0925   MCHC 31.9 02/08/2019  0925   RDW 13.2 02/08/2019 0925   LYMPHSABS 1.3 02/08/2019 0925   MONOABS 0.4 02/08/2019 0925   EOSABS 0.0 02/08/2019 0925   BASOSABS 0.0 02/08/2019 0925   CMP Latest Ref Rng & Units 10/29/2018 02/16/2018 07/24/2017  Glucose 70 - 99 mg/dL 95 91 87  BUN 8 - 23 mg/dL 29(H) 21 23(H)  Creatinine 0.61 - 1.24 mg/dL 1.22 1.22 1.18  Sodium 135 - 145 mmol/L 139 140 136  Potassium 3.5 - 5.1 mmol/L 3.9 4.0 4.2  Chloride 98 - 111 mmol/L 105 109 101  CO2 22 - 32 mmol/L 21(L) 26 22  Calcium 8.9 - 10.3 mg/dL 9.5 8.9 10.1  Total Protein 6.5 - 8.1 g/dL 7.9 - 7.8  Total Bilirubin 0.3 - 1.2 mg/dL 1.0 - 0.8  Alkaline Phos 38 - 126 U/L 79 - 65  AST 15 - 41 U/L 30 - 35  ALT 0 - 44 U/L 28 - 25       DIAGNOSTIC IMAGING:  I have independently reviewed the scans and discussed with the patient.   I have reviewed Venita Lick LPN's note and agree with the documentation.  I personally performed a face-to-face visit, made revisions and my assessment and plan is as follows.    ASSESSMENT & PLAN:   Thrombocytopenia ([place]) 1.  Moderate thrombocytopenia: - Referral from Dr. Nolon Rod office for decreased platelet count of 80 on CBC 10/21/2018. - Denies any easy bruising or bleeding.  He had one episode of nosebleed in 2019 and had to undergo cauterization. - Denies any fevers, night sweats or weight loss.  He spent 2 years at [place], quit in 1982, smoked 1 to 2 packs/day for 10 years. - Previous testing including LDH, B12, copper, hepatitis panel, HIV test and H. pylori test was normal. -CT of the abdomen on 11/08/2018 showed normal-sized spleen with no abdominal or pelvic pathology. - Platelet count on 02/08/2019 decreased to 84.  He was concerned that chemical exposure at [facility] might have contributed to his bone marrow problem. -I have recommended doing a bone marrow aspiration and biopsy to see if he has any MDS or other bone marrow infiltrative process.  We will schedule it at [facility]  long.  I will see him back in 4 weeks to discuss the results.  2.  Malignant melanoma: -Melanoma resected on the left side of the abdomen at [facility] 2014 years ago.  Reportedly stage III.  He had axillary sentinel lymph node biopsy done.   Total time spent is 25 minutes with more than 50% of the time spent face-to-face discussing  lab results, counseling and coordination of care.  Orders placed this encounter:  Orders Placed This Encounter  Procedures  . CT BIOPSY  . CT  BONE MARROW BIOPSY & ASPIRATION      Derek Jack, [place] [phone]

## 2019-02-22 NOTE — Patient Outreach (Signed)
[facility]) Care Management  02/22/2019  VIDUR ARCOS 1950-09-09 [id number]   Medication Adherence call to Mr. Johnpatrick, [place] HIPPA Compliant Voice message left with a call back number. Mr. Shake is showing past due on Rosuvastatin 10 mg and Lisinopril 10 mg under [place].   [facility] Management Direct Dial [phone]  Fax [phone] [email or web address]

## 2019-02-22 NOTE — Assessment & Plan Note (Signed)
1.  Moderate thrombocytopenia: - Referral from Dr. Nolon Rod office for decreased platelet count of 80 on CBC 10/21/2018. - Denies any easy bruising or bleeding.  He had one episode of nosebleed in 2019 and had to undergo cauterization. - Denies any fevers, night sweats or weight loss.  He spent 2 years at [place], quit in 1982, smoked 1 to 2 packs/day for 10 years. - Previous testing including LDH, B12, copper, hepatitis panel, HIV test and H. pylori test was normal. -CT of the abdomen on 11/08/2018 showed normal-sized spleen with no abdominal or pelvic pathology. - Platelet count on 02/08/2019 decreased to 84.  He was concerned that chemical exposure at [facility] might have contributed to his bone marrow problem. -I have recommended doing a bone marrow aspiration and biopsy to see if he has any MDS or other bone marrow infiltrative process.  We will schedule it at [facility] long.  I will see him back in 4 weeks to discuss the results.  2.  Malignant melanoma: -Melanoma resected on the left side of the abdomen at [facility] 2014 years ago.  Reportedly stage III.  He had axillary sentinel lymph node biopsy done.

## 2019-02-28 ENCOUNTER — Other Ambulatory Visit: Payer: Self-pay | Admitting: Radiology

## 2019-03-01 ENCOUNTER — Ambulatory Visit (HOSPITAL_COMMUNITY)
Admission: RE | Admit: 2019-03-01 | Discharge: 2019-03-01 | Disposition: A | Discharge status: Home / Self Care | Payer: Medicare Other | Source: Ambulatory Visit | Attending: Hematology | Admitting: Hematology

## 2019-03-01 ENCOUNTER — Other Ambulatory Visit: Payer: Self-pay

## 2019-03-01 ENCOUNTER — Encounter (HOSPITAL_COMMUNITY): Payer: Self-pay

## 2019-03-01 DIAGNOSIS — M1990 Unspecified osteoarthritis, unspecified site: Secondary | ICD-10-CM | POA: Insufficient documentation

## 2019-03-01 DIAGNOSIS — E7800 Pure hypercholesterolemia, unspecified: Secondary | ICD-10-CM | POA: Diagnosis not present

## 2019-03-01 DIAGNOSIS — K2270 Barrett's esophagus without dysplasia: Secondary | ICD-10-CM | POA: Insufficient documentation

## 2019-03-01 DIAGNOSIS — Z7902 Long term (current) use of antithrombotics/antiplatelets: Secondary | ICD-10-CM | POA: Diagnosis not present

## 2019-03-01 DIAGNOSIS — Z7982 Long term (current) use of aspirin: Secondary | ICD-10-CM | POA: Insufficient documentation

## 2019-03-01 DIAGNOSIS — I10 Essential (primary) hypertension: Secondary | ICD-10-CM | POA: Insufficient documentation

## 2019-03-01 DIAGNOSIS — Z79899 Other long term (current) drug therapy: Secondary | ICD-10-CM | POA: Insufficient documentation

## 2019-03-01 DIAGNOSIS — I2510 Atherosclerotic heart disease of native coronary artery without angina pectoris: Secondary | ICD-10-CM | POA: Insufficient documentation

## 2019-03-01 DIAGNOSIS — D72819 Decreased white blood cell count, unspecified: Secondary | ICD-10-CM | POA: Insufficient documentation

## 2019-03-01 DIAGNOSIS — D696 Thrombocytopenia, unspecified: Secondary | ICD-10-CM | POA: Diagnosis not present

## 2019-03-01 LAB — BASIC METABOLIC PANEL
Anion gap: 8 (ref 5–15)
BUN: 21 mg/dL (ref 8–23)
CO2: 23 mmol/L (ref 22–32)
Calcium: 9.3 mg/dL (ref 8.9–10.3)
Chloride: 108 mmol/L (ref 98–111)
Creatinine, Ser: 1.11 mg/dL (ref 0.61–1.24)
GFR calc Af Amer: >60 mL/min (ref >60)
GFR calc non Af Amer: >60 mL/min (ref >60)
Glucose, Bld: 96 mg/dL (ref 70–99)
Potassium: 4.2 mmol/L (ref 3.5–5.1)
Sodium: 139 mmol/L (ref 135–145)

## 2019-03-01 LAB — CBC WITH DIFFERENTIAL/PLATELET
Abs Immature Granulocytes: 0.02 10*3/uL (ref 0.00–0.07)
Basophils Absolute: 0 10*3/uL (ref 0.0–0.1)
Basophils Relative: 0 %
Eosinophils Absolute: 0 10*3/uL (ref 0.0–0.5)
Eosinophils Relative: 1 %
HCT: 41.1 % (ref 39.0–52.0)
Hemoglobin: 13.5 g/dL (ref 13.0–17.0)
Immature Granulocytes: 1 %
Lymphocytes Relative: 32 %
Lymphs Abs: 1 10*3/uL (ref 0.7–4.0)
MCH: 30.8 pg (ref 26.0–34.0)
MCHC: 32.8 g/dL (ref 30.0–36.0)
MCV: 93.8 fL (ref 80.0–100.0)
Monocytes Absolute: 0.3 10*3/uL (ref 0.1–1.0)
Monocytes Relative: 11 %
Neutro Abs: 1.7 10*3/uL (ref 1.7–7.7)
Neutrophils Relative %: 55 %
Platelets: 81 10*3/uL — ABNORMAL LOW (ref 150–400)
RBC: 4.38 MIL/uL (ref 4.22–5.81)
RDW: 12.9 % (ref 11.5–15.5)
WBC: 3.1 10*3/uL — ABNORMAL LOW (ref 4.0–10.5)
nRBC: 0 % (ref 0.0–0.2)

## 2019-03-01 LAB — PROTIME-INR
INR: 1.1 (ref 0.8–1.2)
Prothrombin Time: 14 seconds (ref 11.4–15.2)

## 2019-03-01 MED ORDER — LIDOCAINE HCL (PF) 1 % IJ SOLN
INTRAMUSCULAR | Status: AC | PRN
  Administered 2019-03-01: 10 mL

## 2019-03-01 MED ORDER — PROMETHAZINE HCL 25 MG/ML IJ SOLN
INTRAMUSCULAR | Status: AC
  Filled 2019-03-01: qty 1

## 2019-03-01 MED ORDER — FENTANYL CITRATE (PF) 100 MCG/2ML IJ SOLN
INTRAMUSCULAR | Status: AC | PRN
  Administered 2019-03-01 (×2): 50 ug via INTRAVENOUS

## 2019-03-01 MED ORDER — NALOXONE HCL 0.4 MG/ML IJ SOLN
INTRAMUSCULAR | Status: AC
  Filled 2019-03-01: qty 1

## 2019-03-01 MED ORDER — PROMETHAZINE HCL 25 MG/ML IJ SOLN
25.0000 mg | Freq: Once | INTRAMUSCULAR | Status: AC
  Administered 2019-03-01: 12:00:00 25 mg via INTRAVENOUS

## 2019-03-01 MED ORDER — FENTANYL CITRATE (PF) 100 MCG/2ML IJ SOLN
INTRAMUSCULAR | Status: AC
  Filled 2019-03-01: qty 2

## 2019-03-01 MED ORDER — ONDANSETRON HCL 4 MG/2ML IJ SOLN
INTRAMUSCULAR | Status: AC
  Filled 2019-03-01: qty 2

## 2019-03-01 MED ORDER — FLUMAZENIL 0.5 MG/5ML IV SOLN
INTRAVENOUS | Status: AC
  Filled 2019-03-01: qty 5

## 2019-03-01 MED ORDER — ONDANSETRON HCL 4 MG/2ML IJ SOLN
4.0000 mg | Freq: Once | INTRAMUSCULAR | Status: AC
  Administered 2019-03-01: 4 mg via INTRAVENOUS

## 2019-03-01 MED ORDER — SODIUM CHLORIDE 0.9 % IV SOLN
INTRAVENOUS | Status: DC
  Administered 2019-03-01: 10:00:00 via INTRAVENOUS

## 2019-03-01 MED ORDER — MIDAZOLAM HCL 2 MG/2ML IJ SOLN
INTRAMUSCULAR | Status: AC
  Filled 2019-03-01: qty 4

## 2019-03-01 MED ORDER — MIDAZOLAM HCL 2 MG/2ML IJ SOLN
INTRAMUSCULAR | Status: AC | PRN
  Administered 2019-03-01: 0.5 mg via INTRAVENOUS
  Administered 2019-03-01 (×2): 1 mg via INTRAVENOUS

## 2019-03-01 NOTE — Consult Note (Signed)
Chief Complaint: Patient was seen in consultation today for CT-guided bone marrow biopsy  Referring Physician(s): Katragadda,Sreedhar  Supervising Physician: Daryll Brod  Patient Status: [facility] - Out-pt  History of Present Illness: [name] is a 68 y.o. male , ex smoker, with remote history of melanoma of abdominal region, CAD/stenting on plavix, easy bruising and now with thrombocytopenia of unknown etiology who presents today for CT-guided bone marrow biopsy for further evaluation.  Additional medical history as below.  Past Medical History:  Diagnosis Date  . Anxiety    patient denies  . Aortic aneurysm (HCC)    ascending  . Arthritis   . Barrett esophagus   . Blood transfusion   . Cancer ([facility]) 2007   melanoma stage 3 stomach bx  . Coronary artery disease    mild by 09/08/13 cath [facility]  . GERD (gastroesophageal reflux disease)    barretts esophagus  . H/O hiatal hernia   . Headache(784.0)   . Hypercholesteremia   . Hypertension   . PONV (postoperative nausea and vomiting)    trouble breathing after last surgery (at [facility])  . Shingles    10  . Sleep apnea    not tested  . 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 ABDOMINAL SURGERY     x3  . back surgeries     x 9  . BACK SURGERY     x8  . BIOPSY N/A 03/28/2013   Procedure: Distal Esophageal Biopsy;  Surgeon: Rogene Houston, MD;  Location: [facility];  Service: Endoscopy;  Laterality: N/A;  . BIOPSY  05/30/2016   Procedure: BIOPSY;  Surgeon: Rogene Houston, MD;  Location: [facility];  Service: Endoscopy;;  . BIOPSY  02/19/2018   Procedure: BIOPSY;  Surgeon: Rogene Houston, MD;  [facility];  Service: Endoscopy;;  Barret's esophagus  . CHEST SURGERY     lung incisional hernia for hernia  . COLONOSCOPY WITH PROPOFOL N/A 02/19/2018   Procedure: COLONOSCOPY WITH PROPOFOL;  Surgeon: Rogene Houston, MD;  [facility];  Service: Endoscopy;   Laterality: N/A;  1:00  . ESOPHAGOGASTRODUODENOSCOPY (EGD) WITH PROPOFOL N/A 03/28/2013   Procedure: ESOPHAGOGASTRODUODENOSCOPY (EGD) WITH PROPOFOL;  Surgeon: Rogene Houston, MD;  Location: [facility];  Service: Endoscopy;  Laterality: N/A;  GE junction @ 38, proximal  margin _0   . ESOPHAGOGASTRODUODENOSCOPY (EGD) WITH PROPOFOL N/A 05/30/2016   Procedure: ESOPHAGOGASTRODUODENOSCOPY (EGD) WITH PROPOFOL;  Surgeon: Rogene Houston, MD;  [facility];  Service: Endoscopy;  Laterality: N/A;  955  . ESOPHAGOGASTRODUODENOSCOPY (EGD) WITH PROPOFOL N/A 02/19/2018   Procedure: ESOPHAGOGASTRODUODENOSCOPY (EGD) WITH PROPOFOL;  Surgeon: Rogene Houston, MD;  [facility];  Service: Endoscopy;  Laterality: N/A;  . HEMORRHOID SURGERY     x2  . HERNIA REPAIR    . HIATAL HERNIA REPAIR    . JOINT REPLACEMENT     lft knee  . KNEE ARTHROPLASTY     lft  . KNEE ARTHROSCOPY Right   . LEG SURGERY     x4 crushed   . LITHOTRIPSY    . MELANOMA EXCISION     lft abdomen  . NECK SURGERY  2012  . POLYPECTOMY  02/19/2018   Procedure: POLYPECTOMY;  Surgeon: Rogene Houston, MD;  [facility];  Service: Endoscopy;;  proximal transverse colon (CB x1), descending colon (CBx3)  . POSTERIOR CERVICAL FUSION/FORAMINOTOMY  08/18/2011  Procedure: POSTERIOR CERVICAL FUSION/FORAMINOTOMY LEVEL 3;  Surgeon: Ophelia Charter, MD;  Location: [place] [facility];  Service: Neurosurgery;  Laterality: N/A;  Cervical three-four, four-five, five-six posterior cervical  fusion with instrumentation; Right cervical three-four, four-five laminotomy  . REVISION TOTAL HIP ARTHROPLASTY Left   . SHOULDER ARTHROSCOPY     rt bith defect , spur, rotator cuff  . SPINAL CORD STIMULATOR IMPLANT     rt hip  . SPINAL CORD STIMULATOR INSERTION N/A 11/03/2016   Procedure: LUMBAR SPINAL CORD STIMULATOR REVISION;  Surgeon: Newman Pies, MD;  Location: [place];  Service: Neurosurgery;  Laterality: N/A;  . THYROID LOBECTOMY     rt     Allergies: Contrast media [iodinated diagnostic agents] and Demerol  Medications: Prior to Admission medications   Medication Sig Start Date End Date Taking? Authorizing Provider  Aspirin-Acetaminophen-Caffeine (GOODY HEADACHE PO) Take 1-2 packets by mouth daily as needed (headaches).   Yes [provider]  Bioflavonoid Products (ESTER C PO) Take 1 tablet by mouth daily.   Yes [provider]  calcium-vitamin D (OSCAL WITH D) 500-200 MG-UNIT per tablet Take 1 tablet by mouth 2 (two) times daily.    Yes [provider]  clopidogrel (PLAVIX) 75 MG tablet TAKE 1 TABLET BY MOUTH ONCE DAILY START DATE 9 10 2020 02/09/19  Yes [provider]  cyclobenzaprine (FLEXERIL) 10 MG tablet Take 1 tablet (10 mg total) by mouth 3 (three) times daily as needed for muscle spasms. 11/05/16  Yes Newman Pies, MD  dexlansoprazole (DEXILANT) 60 MG capsule Take 60 mg by mouth daily.   Yes [provider]  diazepam (VALIUM) 10 MG tablet Take 10 mg by mouth 4 (four) times daily as needed (for back pain).    Yes [provider]  fexofenadine (ALLEGRA) 180 MG tablet Take 180 mg by mouth every evening.   Yes [provider]  fish oil-omega-3 fatty acids 1000 MG capsule Take 1 g by mouth 2 (two) times daily.   Yes [provider]  lisinopril (PRINIVIL,ZESTRIL) 10 MG tablet Take 10 mg by mouth daily.   Yes [provider]  metoCLOPramide (REGLAN) 10 MG tablet TAKE ONE TABLET BY MOUTH TWICE A DAY AS NEEDED FOR NAUSEA 01/26/19  Yes Rehman, Mechele Dawley, MD  montelukast (SINGULAIR) 10 MG tablet Take 10 mg by mouth daily.    Yes [provider]  Multiple Vitamin (MULITIVITAMIN WITH MINERALS) TABS Take 1 tablet by mouth daily.   Yes [provider]  ondansetron (ZOFRAN) 4 MG tablet Take 1 tablet (4 mg total) by mouth every 8 (eight) hours as needed for nausea or vomiting. 09/27/18  Yes Couture, Cortni S, PA-C  rosuvastatin (CRESTOR)  10 MG tablet Take 10 mg by mouth every evening.    Yes [provider]  aspirin EC 81 MG tablet Take by mouth. 02/10/19 02/10/20  [provider]  oxyCODONE-acetaminophen (PERCOCET) 10-325 MG tablet Take 1 tablet by mouth every 6 (six) hours as needed for pain.    [provider]  Prucalopride Succinate (MOTEGRITY) 1 MG TABS Take 1 mg by mouth daily before breakfast. 01/04/19   Rehman, Mechele Dawley, MD     Family History  Problem Relation Age of Onset  . Heart disease Father   . Aneurysm Mother   . Aneurysm Sister   . Heart disease Paternal Grandmother   . Heart disease Paternal Grandfather   . Heart disease Maternal Grandfather   . Heart disease Brother  heart transplant  . Aneurysm Other        pt states several members have aortic aneurysm  . Skin cancer Other        several members  . Lymphoma Brother   . Aneurysm Maternal Grandmother     Social History   Socioeconomic History  . Marital status: Married    Spouse name: Not on file  . Number of children: Not on file  . Years of education: Not on file  . Highest education level: Not on file  Occupational History  . Occupation: retired  Scientific laboratory technician  . Financial resource strain: Not on file  . Food insecurity    Worry: Not on file    Inability: Not on file  . Transportation needs    Medical: Not on file    Non-medical: Not on file  Tobacco Use  . Smoking status: Former Smoker    Packs/day: 4.00    Years: 10.00    Pack years: 40.00    Types: Cigarettes    Quit date: 08/12/1980    Years since quitting: 38.5  . Smokeless tobacco: Never Used  . Tobacco comment: occ wine  Substance and Sexual Activity  . Alcohol use: Yes    Alcohol/week: 0.0 standard drinks    Comment: rarely  . Drug use: No  . Sexual activity: Yes    Birth control/protection: None  Lifestyle  . Physical activity    Days per week: Not on file    Minutes per session: Not on file  . Stress: Not on file  Relationships   . Social Herbalist on phone: Not on file    Gets together: Not on file    Attends religious service: Not on file    Active member of club or organization: Not on file    Attends meetings of clubs or organizations: Not on file    Relationship status: Not on file  Other Topics Concern  . Not on file  Social History Narrative  . Not on file      Review of Systems :denies fever, headache, chest pain, dyspnea, cough, abdominal pain, vomiting or bleeding.  He does have back pain and occasional nausea.Easy bruising as well  Vital Signs: BP (!) 146/98   Pulse 77   Temp 98.2 F (36.8 C) (Oral)   Resp 18   SpO2 97%   Physical Exam awake, alert.  Chest clear to auscultation bilaterally.  Heart with regular rate and rhythm.  Abdomen soft, positive bowel sounds, nontender.  No lower extremity edema.  Scattered ecchymoses noted.  Imaging: No results found.  Labs:  CBC: Recent Labs    10/29/18 1424 02/08/19 0925 03/01/19 0923  WBC 3.8* 3.4* 3.1*  HGB 16.1 14.0 13.5  HCT 48.7 43.9 41.1  PLT 109* 84* 81*    COAGS: Recent Labs    03/01/19 0923  INR 1.1    BMP: Recent Labs    10/29/18 1424 03/01/19 0923  NA 139 139  K 3.9 4.2  CL 105 108  CO2 21* 23  GLUCOSE 95 96  BUN 29* 21  CALCIUM 9.5 9.3  CREATININE 1.22 1.11  GFRNONAA >60 >60  GFRAA >60 >60    LIVER FUNCTION TESTS: Recent Labs    10/29/18 1424  BILITOT 1.0  AST 30  ALT 28  ALKPHOS 79  PROT 7.9  ALBUMIN 4.6    TUMOR MARKERS: No results for input(s): AFPTM, CEA, CA199, CHROMGRNA in the last 8760 hours.  Assessment and Plan: 68 y.o. male , ex smoker, with remote history of melanoma of abdominal region, CAD/stenting on plavix, easy bruising and now with thrombocytopenia/mild leukopenia of unknown etiology who presents today for CT-guided bone marrow biopsy for further evaluation.Risks and benefits of procedure was discussed with the patient  including, but not limited to bleeding,  infection, damage to adjacent structures or low yield requiring additional tests.  All of the questions were answered and there is agreement to proceed.  Consent signed and in chart.     Thank you for this interesting consult.  I greatly enjoyed meeting AHMED INNISS and look forward to participating in their care.  A copy of this report was sent to the requesting provider on this date.  Electronically Signed: D. Rowe Robert, PA-C 03/01/2019, 10:21 AM   I spent a total of 20 minutes    in face to face in clinical consultation, greater than 50% of which was counseling/coordinating care for CT-guided bone marrow biopsy

## 2019-03-01 NOTE — Procedures (Signed)
Thrombocytopenia  S/p CT BM ASP AND CORE BX  No comp Stable ebl min Path pending Full report in pacs

## 2019-03-01 NOTE — Discharge Instructions (Addendum)
Contact Interventional Radiologist PA or IR on call at [phone] with any questions or concerns.     Bone Marrow Aspiration and Bone Marrow Biopsy, Adult, Care After This sheet gives you information about how to care for yourself after your procedure. Your health care provider may also give you more specific instructions. If you have problems or questions, contact your health care provider. What can I expect after the procedure? After the procedure, it is common to have:  Mild pain and tenderness.  Swelling.  Bruising. Follow these instructions at home: Puncture site care      Follow instructions from your health care provider about how to take care of the puncture site. Make sure you: ? Wash your hands with soap and water before you change your bandage (dressing). If soap and water are not available, use hand sanitizer. ? Change your dressing as told by your health care provider.  You may remove your dressing tomorrow.  Check your puncture siteevery day for signs of infection. Check for: ? More redness, swelling, or pain. ? More fluid or blood. ? Warmth. ? Pus or a bad smell. General instructions  Take over-the-counter and prescription medicines only as told by your health care provider.  Do not take baths, swim, or use a hot tub until your health care provider approves. Ask if you can take a shower or have a sponge bath.  You may shower tomorrow.  Return to your normal activities as told by your health care provider. Ask your health care provider what activities are safe for you.  Do not drive for 24 hours if you were given a medicine to help you relax (sedative) during your procedure.  Keep all follow-up visits as told by your health care provider. This is important. Contact a health care provider if:  Contact Interventional Radiologist PA or IR on call at [phone] with any questions or concerns.   Your pain is not controlled with medicine.  Increasing  shortness of breath.   Increasing pain at the biopsy site.   Tachycardia (pulse greater than 100 for two (2) consecutive readings).  Hypotension (systolic blood pressure less than 100 mmHg for two (2) consecutive readings, or   Other obvious patient distress. Get help right away if:  You have a fever.  You have more redness, swelling, or pain around the puncture site.  You have more fluid or blood coming from the puncture site.  Your puncture site feels warm to the touch.  You have pus or a bad smell coming from the puncture site. These symptoms may represent a serious problem that is an emergency. Do not wait to see if the symptoms will go away. Get medical help right away. Call your local emergency services (911 in the [place] Do not drive yourself to the hospital. Summary  After the procedure, it is common to have mild pain, tenderness, swelling, and bruising.  Follow instructions from your health care provider about how to take care of the puncture site.  Get help right away if you have any symptoms of infection or if you have more blood or fluid coming from the puncture site. This information is not intended to replace advice given to you by your health care provider. Make sure you discuss any questions you have with your health care provider. Document Released: 12/06/2004 Document Revised: 09/01/2017 Document Reviewed: 10/31/2015 Elsevier Patient Education  2020 [employer]. Moderate Conscious Sedation, Adult, Care After These instructions provide you with information about caring  for yourself after your procedure. Your health care provider may also give you more specific instructions. Your treatment has been planned according to current medical practices, but problems sometimes occur. Call your health care provider if you have any problems or questions after your procedure. What can I expect after the procedure? After your procedure, it is common:  To feel sleepy for  several hours.  To feel clumsy and have poor balance for several hours.  To have poor judgment for several hours.  To vomit if you eat too soon. Follow these instructions at home: For at least 24 hours after the procedure:   Do not: ? Participate in activities where you could fall or become injured. ? Drive. ? Use heavy machinery. ? Drink alcohol. ? Take sleeping pills or medicines that cause drowsiness. ? Make important decisions or sign legal documents. ? Take care of children on your own.  Rest. Eating and drinking  Follow the diet recommended by your health care provider.  If you vomit: ? Drink water, juice, or soup when you can drink without vomiting. ? Make sure you have little or no nausea before eating solid foods. General instructions  Have a responsible adult stay with you until you are awake and alert.  Take over-the-counter and prescription medicines only as told by your health care provider.  If you smoke, do not smoke without supervision.  Keep all follow-up visits as told by your health care provider. This is important. Contact a health care provider if:  You keep feeling nauseous or you keep vomiting.  You feel light-headed.  You develop a rash.  You have a fever. Get help right away if:  You have trouble breathing. This information is not intended to replace advice given to you by your health care provider. Make sure you discuss any questions you have with your health care provider. Document Released: 03/09/2013 Document Revised: 05/01/2017 Document Reviewed: 09/08/2015 Elsevier Patient Education  2020 [employer].

## 2019-03-03 LAB — SURGICAL PATHOLOGY

## 2019-03-08 ENCOUNTER — Encounter (HOSPITAL_COMMUNITY): Payer: Self-pay | Admitting: Hematology

## 2019-03-09 ENCOUNTER — Encounter (HOSPITAL_COMMUNITY): Payer: Self-pay | Admitting: Hematology

## 2019-03-10 DIAGNOSIS — I10 Essential (primary) hypertension: Secondary | ICD-10-CM | POA: Diagnosis not present

## 2019-03-10 DIAGNOSIS — E782 Mixed hyperlipidemia: Secondary | ICD-10-CM | POA: Diagnosis not present

## 2019-03-10 DIAGNOSIS — I7789 Other specified disorders of arteries and arterioles: Secondary | ICD-10-CM | POA: Diagnosis not present

## 2019-03-10 DIAGNOSIS — I2510 Atherosclerotic heart disease of native coronary artery without angina pectoris: Secondary | ICD-10-CM | POA: Diagnosis not present

## 2019-03-10 DIAGNOSIS — Z955 Presence of coronary angioplasty implant and graft: Secondary | ICD-10-CM | POA: Diagnosis not present

## 2019-03-16 ENCOUNTER — Other Ambulatory Visit: Payer: Self-pay

## 2019-03-17 ENCOUNTER — Inpatient Hospital Stay (HOSPITAL_COMMUNITY): Payer: Medicare Other | Attending: Hematology | Admitting: Hematology

## 2019-03-17 ENCOUNTER — Encounter (HOSPITAL_COMMUNITY): Payer: Self-pay | Admitting: Hematology

## 2019-03-17 VITALS — BP 136/73 | HR 86 | Temp 97.5°F | Resp 18 | Wt 240.6 lb

## 2019-03-17 DIAGNOSIS — K219 Gastro-esophageal reflux disease without esophagitis: Secondary | ICD-10-CM | POA: Insufficient documentation

## 2019-03-17 DIAGNOSIS — R519 Headache, unspecified: Secondary | ICD-10-CM | POA: Insufficient documentation

## 2019-03-17 DIAGNOSIS — Z85820 Personal history of malignant melanoma of skin: Secondary | ICD-10-CM | POA: Diagnosis not present

## 2019-03-17 DIAGNOSIS — R112 Nausea with vomiting, unspecified: Secondary | ICD-10-CM | POA: Insufficient documentation

## 2019-03-17 DIAGNOSIS — M545 Low back pain: Secondary | ICD-10-CM | POA: Insufficient documentation

## 2019-03-17 DIAGNOSIS — Z79899 Other long term (current) drug therapy: Secondary | ICD-10-CM | POA: Diagnosis not present

## 2019-03-17 DIAGNOSIS — D696 Thrombocytopenia, unspecified: Secondary | ICD-10-CM | POA: Insufficient documentation

## 2019-03-17 DIAGNOSIS — K5900 Constipation, unspecified: Secondary | ICD-10-CM | POA: Diagnosis not present

## 2019-03-17 DIAGNOSIS — Z87891 Personal history of nicotine dependence: Secondary | ICD-10-CM | POA: Diagnosis not present

## 2019-03-17 DIAGNOSIS — Z808 Family history of malignant neoplasm of other organs or systems: Secondary | ICD-10-CM | POA: Insufficient documentation

## 2019-03-17 DIAGNOSIS — Z8249 Family history of ischemic heart disease and other diseases of the circulatory system: Secondary | ICD-10-CM | POA: Insufficient documentation

## 2019-03-17 NOTE — Progress Notes (Signed)
[address] [place], [name] [id number]   CLINIC:  Medical Oncology/Hematology  PCP:  [facility], [name] [address] [phone]   REASON FOR VISIT:  Follow-up for thrombocytopenia.     INTERVAL HISTORY:  [name] 68 y.o. male seen for follow-up of thrombocytopenia.  He underwent bone marrow biopsy on 03/01/2019.  He reportedly developed nausea and vomiting after the procedure.  He reports appetite of 100% and energy levels are 50%.  Pain in the low back is rated as 4 out of 10 and chronic.  Headaches which are chronic are stable too.  Denies any fevers or infections in the preceding couple of months.  No night sweats or significant weight loss reported.  REVIEW OF SYSTEMS:  Review of Systems  Gastrointestinal: Positive for constipation.  Neurological: Positive for headaches.  All other systems reviewed and are negative.    PAST MEDICAL/SURGICAL HISTORY:  Past Medical History:  Diagnosis Date  . Anxiety    patient denies  . Aortic aneurysm (HCC)    ascending  . Arthritis   . Barrett esophagus   . Blood transfusion   . Cancer ([facility]) 2007   melanoma stage 3 stomach bx  . Coronary artery disease    mild by 09/08/13 cath [facility]  . GERD (gastroesophageal reflux disease)    barretts esophagus  . H/O hiatal hernia   . Headache(784.0)   . Hypercholesteremia   . Hypertension   . PONV (postoperative nausea and vomiting)    trouble breathing after last surgery (at [facility])  . Shingles    10  . Sleep apnea    not tested  . Tumor liver    no problems   Past Surgical History:  Procedure Laterality Date  . ABDOMINAL SURGERY     x3  . back surgeries     x 9  . BACK SURGERY     x8  . BIOPSY N/A 03/28/2013   Procedure: Distal Esophageal Biopsy;  Surgeon: Rogene Houston, MD;  Location: [facility];  Service: Endoscopy;  Laterality: N/A;  . BIOPSY  05/30/2016   Procedure: BIOPSY;  Surgeon: Rogene Houston, MD;  [facility];  Service: Endoscopy;;  . BIOPSY  02/19/2018   Procedure: BIOPSY;  Surgeon: Rogene Houston, MD;  [facility];  Service: Endoscopy;;  Barret's esophagus  . CHEST SURGERY     lung incisional hernia for hernia  . COLONOSCOPY WITH PROPOFOL N/A 02/19/2018   Procedure: COLONOSCOPY WITH PROPOFOL;  Surgeon: Rogene Houston, MD;  [facility];  Service: Endoscopy;  Laterality: N/A;  1:00  . ESOPHAGOGASTRODUODENOSCOPY (EGD) WITH PROPOFOL N/A 03/28/2013   Procedure: ESOPHAGOGASTRODUODENOSCOPY (EGD) WITH PROPOFOL;  Surgeon: Rogene Houston, MD;  Location: [facility];  Service: Endoscopy;  Laterality: N/A;  GE junction @ 38, proximal  margin _0   . ESOPHAGOGASTRODUODENOSCOPY (EGD) WITH PROPOFOL N/A 05/30/2016   Procedure: ESOPHAGOGASTRODUODENOSCOPY (EGD) WITH PROPOFOL;  Surgeon: Rogene Houston, MD;  [facility];  Service: Endoscopy;  Laterality: N/A;  955  . ESOPHAGOGASTRODUODENOSCOPY (EGD) WITH PROPOFOL N/A 02/19/2018   Procedure: ESOPHAGOGASTRODUODENOSCOPY (EGD) WITH PROPOFOL;  Surgeon: Rogene Houston, MD;  [facility];  Service: Endoscopy;  Laterality: N/A;  . HEMORRHOID SURGERY     x2  . HERNIA REPAIR    . HIATAL HERNIA REPAIR    . JOINT REPLACEMENT     lft knee  . KNEE ARTHROPLASTY  lft  . KNEE ARTHROSCOPY Right   . LEG SURGERY     x4 crushed   . LITHOTRIPSY    . MELANOMA EXCISION     lft abdomen  . NECK SURGERY  2012  . POLYPECTOMY  02/19/2018   Procedure: POLYPECTOMY;  Surgeon: Rogene Houston, MD;  [facility];  Service: Endoscopy;;  proximal transverse colon (CB x1), descending colon (CBx3)  . POSTERIOR CERVICAL FUSION/FORAMINOTOMY  08/18/2011   Procedure: POSTERIOR CERVICAL FUSION/FORAMINOTOMY LEVEL 3;  Surgeon: Ophelia Charter, MD;  Location: [facility];  Service: Neurosurgery;  Laterality: N/A;  Cervical three-four, four-five, five-six posterior cervical  fusion with instrumentation; Right cervical three-four,  four-five laminotomy  . REVISION TOTAL HIP ARTHROPLASTY Left   . SHOULDER ARTHROSCOPY     rt bith defect , spur, rotator cuff  . SPINAL CORD STIMULATOR IMPLANT     rt hip  . SPINAL CORD STIMULATOR INSERTION N/A 11/03/2016   Procedure: LUMBAR SPINAL CORD STIMULATOR REVISION;  Surgeon: Newman Pies, MD;  Location: [facility];  Service: Neurosurgery;  Laterality: N/A;  . THYROID LOBECTOMY     rt     SOCIAL HISTORY:  Social History   Socioeconomic History  . Marital status: Married    Spouse name: Not on file  . Number of children: Not on file  . Years of education: Not on file  . Highest education level: Not on file  Occupational History  . Occupation: retired  Scientific laboratory technician  . Financial resource strain: Not on file  . Food insecurity    Worry: Not on file    Inability: Not on file  . Transportation needs    Medical: Not on file    Non-medical: Not on file  Tobacco Use  . Smoking status: Former Smoker    Packs/day: 4.00    Years: 10.00    Pack years: 40.00    Types: Cigarettes    Quit date: 08/12/1980    Years since quitting: 38.6  . Smokeless tobacco: Never Used  . Tobacco comment: occ wine  Substance and Sexual Activity  . Alcohol use: Yes    Alcohol/week: 0.0 standard drinks    Comment: rarely  . Drug use: No  . Sexual activity: Yes    Birth control/protection: None  Lifestyle  . Physical activity    Days per week: Not on file    Minutes per session: Not on file  . Stress: Not on file  Relationships  . Social Herbalist on phone: Not on file    Gets together: Not on file    Attends religious service: Not on file    Active member of club or organization: Not on file    Attends meetings of clubs or organizations: Not on file    Relationship status: Not on file  . Intimate partner violence    Fear of current or ex partner: Not on file    Emotionally abused: Not on file    Physically abused: Not on file    Forced sexual activity: Not on file  Other  Topics Concern  . Not on file  Social History Narrative  . Not on file    FAMILY HISTORY:  Family History  Problem Relation Age of Onset  . Heart disease Father   . Aneurysm Mother   . Aneurysm Sister   . Heart disease Paternal Grandmother   . Heart disease Paternal Grandfather   . Heart disease Maternal Grandfather   .  Heart disease Brother        heart transplant  . Aneurysm Other        pt states several members have aortic aneurysm  . Skin cancer Other        several members  . Lymphoma Brother   . Aneurysm Maternal Grandmother     CURRENT MEDICATIONS:  Outpatient Encounter Medications as of 03/17/2019  Medication Sig Note  . aspirin EC 81 MG tablet Take by mouth.   Marland Kitchen Bioflavonoid Products (ESTER C PO) Take 1 tablet by mouth daily.   . calcium-vitamin D (OSCAL WITH D) 500-200 MG-UNIT per tablet Take 1 tablet by mouth 2 (two) times daily.    . clopidogrel (PLAVIX) 75 MG tablet TAKE 1 TABLET BY MOUTH ONCE DAILY START DATE 9 10 2020   . dexlansoprazole (DEXILANT) 60 MG capsule Take 60 mg by mouth daily.    . fexofenadine (ALLEGRA) 180 MG tablet Take 180 mg by mouth every evening.   . fish oil-omega-3 fatty acids 1000 MG capsule Take 1 g by mouth 2 (two) times daily.   Marland Kitchen lisinopril (PRINIVIL,ZESTRIL) 10 MG tablet Take 10 mg by mouth daily.   . metoCLOPramide (REGLAN) 10 MG tablet TAKE ONE TABLET BY MOUTH TWICE A DAY AS NEEDED FOR NAUSEA   . montelukast (SINGULAIR) 10 MG tablet Take 10 mg by mouth daily.    . Multiple Vitamin (MULITIVITAMIN WITH MINERALS) TABS Take 1 tablet by mouth daily.   . rosuvastatin (CRESTOR) 10 MG tablet Take 10 mg by mouth every evening.    . Aspirin-Acetaminophen-Caffeine (GOODY HEADACHE PO) Take 1-2 packets by mouth daily as needed (headaches).   . cyclobenzaprine (FLEXERIL) 10 MG tablet Take 1 tablet (10 mg total) by mouth 3 (three) times daily as needed for muscle spasms. (Patient not taking: Reported on 03/17/2019)   . diazepam (VALIUM) 10 MG  tablet Take 10 mg by mouth 4 (four) times daily as needed (for back pain).    . ondansetron (ZOFRAN) 4 MG tablet Take 1 tablet (4 mg total) by mouth every 8 (eight) hours as needed for nausea or vomiting. (Patient not taking: Reported on 03/17/2019) 11/17/2018: AS NEEDED.  Marland Kitchen oxyCODONE-acetaminophen (PERCOCET) 10-325 MG tablet Take 1 tablet by mouth every 6 (six) hours as needed for pain.   . [DISCONTINUED] Prucalopride Succinate (MOTEGRITY) 1 MG TABS Take 1 mg by mouth daily before breakfast. 03/01/2019: Patient states he is not taking   No facility-administered encounter medications on file as of 03/17/2019.     ALLERGIES:  Allergies  Allergen Reactions  . Contrast Media [Iodinated Diagnostic Agents] Nausea Only  . Demerol Other (See Comments)    "Makes me crazy."     PHYSICAL EXAM:  ECOG Performance status: 1  Vitals:   03/17/19 1531  BP: 136/73  Pulse: 86  Resp: 18  Temp: (!) 97.5 F (36.4 C)  SpO2: 95%   Filed Weights   03/17/19 1531  Weight: 240 lb 9.6 oz (109.1 kg)    Physical Exam Vitals signs reviewed.  Constitutional:      Appearance: Normal appearance.  Cardiovascular:     Rate and Rhythm: Normal rate and regular rhythm.     Heart sounds: Normal heart sounds.  Pulmonary:     Effort: Pulmonary effort is normal.     Breath sounds: Normal breath sounds.  Abdominal:     General: There is no distension.     Palpations: Abdomen is soft. There is no mass.  Musculoskeletal:  General: No swelling.  Skin:    General: Skin is warm.  Neurological:     General: No focal deficit present.     Mental Status: He is alert and oriented to person, place, and time.  Psychiatric:        Mood and Affect: Mood normal.        Behavior: Behavior normal.      LABORATORY DATA:  I have reviewed the labs as listed.  CBC    Component Value Date/Time   WBC 3.1 (L) 03/01/2019 0923   RBC 4.38 03/01/2019 0923   HGB 13.5 03/01/2019 0923   HCT 41.1 03/01/2019 0923   PLT  81 (L) 03/01/2019 0923   MCV 93.8 03/01/2019 0923   MCH 30.8 03/01/2019 0923   MCHC 32.8 03/01/2019 0923   RDW 12.9 03/01/2019 0923   LYMPHSABS 1.0 03/01/2019 0923   MONOABS 0.3 03/01/2019 0923   EOSABS 0.0 03/01/2019 0923   BASOSABS 0.0 03/01/2019 0923   CMP Latest Ref Rng & Units 03/01/2019 10/29/2018 02/16/2018  Glucose 70 - 99 mg/dL 96 95 91  BUN 8 - 23 mg/dL 21 29(H) 21  Creatinine 0.61 - 1.24 mg/dL 1.11 1.22 1.22  Sodium 135 - 145 mmol/L 139 139 140  Potassium 3.5 - 5.1 mmol/L 4.2 3.9 4.0  Chloride 98 - 111 mmol/L 108 105 109  CO2 22 - 32 mmol/L 23 21(L) 26  Calcium 8.9 - 10.3 mg/dL 9.3 9.5 8.9  Total Protein 6.5 - 8.1 g/dL - 7.9 -  Total Bilirubin 0.3 - 1.2 mg/dL - 1.0 -  Alkaline Phos 38 - 126 U/L - 79 -  AST 15 - 41 U/L - 30 -  ALT 0 - 44 U/L - 28 -       DIAGNOSTIC IMAGING:  I have independently reviewed the scans and discussed with the patient.   I have reviewed Venita Lick LPN's note and agree with the documentation.  I personally performed a face-to-face visit, made revisions and my assessment and plan is as follows.    ASSESSMENT & PLAN:   Thrombocytopenia ([place]) 1.  Moderate thrombocytopenia: -Referral from Dr. Nolon Rod office for decreased platelet count of 80 on CBC 10/21/2018. -Denies any easy bruising or bleeding.  One episode of nosebleed in 2019 and had to undergo cauterization. -Denies any fevers, night sweats or weight loss.  Ex-smoker, quit in 1982, smoked 1 to 2 packs/day for 10 years.  He also spent 2 years at [facility]. -Previous testing including LDH, B12, copper, hepatitis panel, HIV test and H. pylori test were normal. -CT abdomen on 11/08/2018 showed normal-sized spleen with no abdominal or pelvic pathology. -CBC on 03/01/2019 showed platelet count of 81 and white count of 3.1 and ANC of 1700.  Hemoglobin was normal. -Bone marrow biopsy on 03/01/2019 showed slightly hypercellular for age with trilineage hematopoiesis including abundant  megakaryocytes with predominantly normal morphology.  Overall myeloid changes are nonspecific and not diagnostic of MDS. -Chromosome analysis was normal.  MDS FISH panel was normal. -The quadrant biopsy showed small lymphoid aggregates, the features are not considered specific or diagnostic of a lymphoproliferative process. -I discussed the biopsy results with the patient in detail. 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 -We will consider testing for NGS myeloid panel.  We will also obtain SPEP prior to next visit. -I have recommended first-line therapy with dexamethasone should his platelet count drop below 30,000 or any active bleeding. -We will check him back in 3 months  for follow-up.  2.  Malignant melanoma: -Melanoma resected on the left side of the abdomen at [facility] years ago. -Reportedly stage III.  He had axillary sentinel lymph node biopsy done.   Total time spent is 40 minutes with more than 50% of the time spent face-to-face discussing bone marrow biopsy results, new diagnosis, treatment plan, counseling and coordination of care.  Orders placed this encounter:  Orders Placed This Encounter  Procedures  . CBC with Differential/Platelet  . Comprehensive metabolic panel  . Protein electrophoresis, serum  . Lactate dehydrogenase      Derek Jack, MD [place] [phone]

## 2019-03-17 NOTE — Assessment & Plan Note (Addendum)
1.  Moderate thrombocytopenia: -Referral from Dr. Nolon Rod office for decreased platelet count of 80 on CBC 10/21/2018. -Denies any easy bruising or bleeding.  One episode of nosebleed in 2019 and had to undergo cauterization. -Denies any fevers, night sweats or weight loss.  Ex-smoker, quit in 1982, smoked 1 to 2 packs/day for 10 years.  He also spent 2 years at [facility]. -Previous testing including LDH, B12, copper, hepatitis panel, HIV test and H. pylori test were normal. -CT abdomen on 11/08/2018 showed normal-sized spleen with no abdominal or pelvic pathology. -CBC on 03/01/2019 showed platelet count of 81 and white count of 3.1 and ANC of 1700.  Hemoglobin was normal. -Bone marrow biopsy on 03/01/2019 showed slightly hypercellular for age with trilineage hematopoiesis including abundant megakaryocytes with predominantly normal morphology.  Overall myeloid changes are nonspecific and not diagnostic of MDS. -Chromosome analysis was normal.  MDS FISH panel was normal. -The quadrant biopsy showed small lymphoid aggregates, the features are not considered specific or diagnostic of a lymphoproliferative process. -I discussed the biopsy results with the patient in detail. 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 -We will consider testing for NGS myeloid panel.  We will also obtain SPEP prior to next visit. -I have recommended first-line therapy with dexamethasone should his platelet count drop below 30,000 or any active bleeding. -We will check him back in 3 months for follow-up.  2.  Malignant melanoma: -Melanoma resected on the left side of the abdomen at [facility] 14 years ago. -Reportedly stage III.  He had axillary sentinel lymph node biopsy done.

## 2019-03-17 NOTE — Patient Instructions (Signed)
[facility] Discharge Instructions  You were seen today by Dr. Katragadda. He went over your recent test results. He will see you back in 3 months for labs and follow up.   Thank you for choosing [facility] at [facility] to provide your oncology and hematology care.  To afford each patient quality time with our provider, please arrive at least 15 minutes before your scheduled appointment time.   If you have a lab appointment with the Cancer Center please come in thru the  Main Entrance and check in at the main information desk  You need to re-schedule your appointment should you arrive 10 or more minutes late.  We strive to give you quality time with our providers, and arriving late affects you and other patients whose appointments are after yours.  Also, if you no show three or more times for appointments you may be dismissed from the clinic at the providers discretion.     Again, thank you for choosing [facility].  Our hope is that these requests will decrease the amount of time that you wait before being seen by our physicians.       _____________________________________________________________  Should you have questions after your visit to  Cancer Center, please contact our office at [phone] between the hours of 8:00 a.m. and 4:30 p.m.  Voicemails left after 4:00 p.m. will not be returned until the following business day.  For prescription refill requests, have your pharmacy contact our office and allow 72 hours.    Cancer Center Support Programs:   > Cancer Support Group  2nd Tuesday of the month 1pm-2pm, Journey Room    

## 2019-03-23 ENCOUNTER — Encounter (HOSPITAL_COMMUNITY): Payer: Self-pay | Admitting: *Deleted

## 2019-03-24 ENCOUNTER — Ambulatory Visit (HOSPITAL_COMMUNITY): Payer: Medicare Other | Admitting: Hematology

## 2019-04-11 ENCOUNTER — Ambulatory Visit (INDEPENDENT_AMBULATORY_CARE_PROVIDER_SITE_OTHER): Payer: Medicare Other | Admitting: Internal Medicine

## 2019-04-21 DIAGNOSIS — M1991 Primary osteoarthritis, unspecified site: Secondary | ICD-10-CM | POA: Diagnosis not present

## 2019-04-21 DIAGNOSIS — N200 Calculus of kidney: Secondary | ICD-10-CM | POA: Diagnosis not present

## 2019-04-21 DIAGNOSIS — K219 Gastro-esophageal reflux disease without esophagitis: Secondary | ICD-10-CM | POA: Diagnosis not present

## 2019-04-21 DIAGNOSIS — I10 Essential (primary) hypertension: Secondary | ICD-10-CM | POA: Diagnosis not present

## 2019-04-25 DIAGNOSIS — M549 Dorsalgia, unspecified: Secondary | ICD-10-CM | POA: Diagnosis not present

## 2019-04-25 DIAGNOSIS — N200 Calculus of kidney: Secondary | ICD-10-CM | POA: Diagnosis not present

## 2019-05-12 ENCOUNTER — Other Ambulatory Visit (INDEPENDENT_AMBULATORY_CARE_PROVIDER_SITE_OTHER): Payer: Self-pay | Admitting: Internal Medicine

## 2019-05-24 ENCOUNTER — Ambulatory Visit (INDEPENDENT_AMBULATORY_CARE_PROVIDER_SITE_OTHER): Payer: Medicare Other | Admitting: Internal Medicine

## 2019-05-24 ENCOUNTER — Other Ambulatory Visit: Payer: Self-pay

## 2019-05-24 ENCOUNTER — Encounter (INDEPENDENT_AMBULATORY_CARE_PROVIDER_SITE_OTHER): Payer: Self-pay | Admitting: Internal Medicine

## 2019-05-24 VITALS — BP 144/88 | HR 80 | Temp 97.5°F | Ht 72.0 in | Wt 239.6 lb

## 2019-05-24 DIAGNOSIS — K8689 Other specified diseases of pancreas: Secondary | ICD-10-CM

## 2019-05-24 DIAGNOSIS — K219 Gastro-esophageal reflux disease without esophagitis: Secondary | ICD-10-CM

## 2019-05-24 DIAGNOSIS — M549 Dorsalgia, unspecified: Secondary | ICD-10-CM | POA: Diagnosis not present

## 2019-05-24 DIAGNOSIS — K5900 Constipation, unspecified: Secondary | ICD-10-CM | POA: Diagnosis not present

## 2019-05-24 DIAGNOSIS — K3184 Gastroparesis: Secondary | ICD-10-CM

## 2019-05-24 MED ORDER — POLYETHYLENE GLYCOL 3350 17 GM/SCOOP PO POWD: 8.5000 g | Freq: Every day | ORAL | 0 refills | Status: DC

## 2019-05-24 NOTE — Progress Notes (Signed)
Presenting complaint;  Follow-up for GERD and gastroparesis. Patient complains of constipation and pain in left lumbar region. He also has a copy of recent CT which he wants me to review.  Database and subjective:  Patient is 68 year old Caucasian male who is here for scheduled visit.  He was last seen in August 2020.  His GI history can be summarized as follows.  He has chronic GERD complicated by short segment Barrett's esophagus.  Last surgery was via thoracic approach.  Last EGD was in September 2019 and was negative for Barrett's mucosa.  Suspect Barrett's mucosa has been ablated with prior biopsies.  He also has history of diarrhea and colonic adenomas.  Last colonoscopy was in September 2019 with removal of 3 small tubular adenomas.  He also has gastroparesis.  He has severe gastroparesis.  There was only 9% emptying in 4 hours on the study of January 2018.  He had follow-up study in July this year while on domperidone and there was virtually no improvement.  He was therefore switched to metoclopramide.  He has been taking it 3 times a day with dose was dropped to twice a day.  Earlier this year he was having recurrent spells of nausea and vomiting and his HIDA scan was was normal and ejection fraction was 51%.  Earlier this year he was evaluated for question of cirrhosis because he had thrombocytopenia.  He had multiple studies including abdominal pelvic CT ultrasound with elastography as well as biochemical markers.  It was concluded that he does not have chronic liver disease/cirrhosis and thrombocytopenia was due to chronic liver disease.   Patient complains of pain in left lumbar region for the last 3 months.  He says pain is less when he lies on his right side and much more when he lies on his left side.  He has chronic back pain with multiple back surgeries.  However he is not convinced that this pain is related to his back.  He had a CT through [facility] system and was negative for  urolithiasis.  It showed calcification involving uncinate process of pancreas as well as small ventral hernia in the mid abdomen to the left of midline. He is having heartburn maybe once or twice a week and uses OTC medications.  He denies dysphagia.  He has been watching his diet.  He has not had any nausea vomiting recently.  He also denies sore throat hoarseness or chronic cough.  He now complains of constipation.  He denies melena or rectal bleeding.  He wonders if he can take polyethylene glycol. He says he does not take Valium or pain medication often.  He may go several days or weeks before he takes 1 other other medication. He also complains of insomnia.  He has sleep apnea.  He states using CPAP was not helpful. He is he is not having any side effects with metoclopramide.   Current Medications: Outpatient Encounter Medications as of 05/24/2019  Medication Sig  . aspirin EC 81 MG tablet Take 81 mg by mouth. Patient states that he takes 3 times a week  . Aspirin-Acetaminophen-Caffeine (GOODY HEADACHE PO) Take 1-2 packets by mouth daily as needed (headaches).  Marland Kitchen Bioflavonoid Products (ESTER C PO) Take 1 tablet by mouth daily.  . calcium-vitamin D (OSCAL WITH D) 500-200 MG-UNIT per tablet Take 1 tablet by mouth 2 (two) times daily.   . clopidogrel (PLAVIX) 75 MG tablet TAKE 1 TABLET BY MOUTH ONCE DAILY START DATE 9 10 2020  .  cyclobenzaprine (FLEXERIL) 10 MG tablet Take 1 tablet (10 mg total) by mouth 3 (three) times daily as needed for muscle spasms.  Marland Kitchen dexlansoprazole (DEXILANT) 60 MG capsule Take 60 mg by mouth daily.   . diazepam (VALIUM) 10 MG tablet Take 10 mg by mouth 4 (four) times daily as needed (for back pain).   . fexofenadine (ALLEGRA) 180 MG tablet Take 180 mg by mouth every evening.  . fish oil-omega-3 fatty acids 1000 MG capsule Take 1 g by mouth 2 (two) times daily.  Marland Kitchen lisinopril (PRINIVIL,ZESTRIL) 10 MG tablet Take 10 mg by mouth daily.  . metoCLOPramide (REGLAN) 10 MG  tablet TAKE ONE TABLET BY MOUTH TWICE A DAY AS NEEDED FOR NAUSEA  . montelukast (SINGULAIR) 10 MG tablet Take 10 mg by mouth daily.   . Multiple Vitamin (MULITIVITAMIN WITH MINERALS) TABS Take 1 tablet by mouth daily.  . ondansetron (ZOFRAN) 4 MG tablet Take 1 tablet (4 mg total) by mouth every 8 (eight) hours as needed for nausea or vomiting.  Marland Kitchen oxyCODONE-acetaminophen (PERCOCET) 10-325 MG tablet Take 1 tablet by mouth every 6 (six) hours as needed for pain.  . rosuvastatin (CRESTOR) 10 MG tablet Take 10 mg by mouth every evening.    No facility-administered encounter medications on file as of 05/24/2019.     Objective: Blood pressure (!) 144/88, pulse 80, temperature (!) 97.5 F (36.4 C), temperature source Oral, height 6' (1.829 m), weight 239 lb 9.6 oz (108.7 kg). Patient is alert and in no acute distress. He is wearing facial mask. He does not have tremor or abnormal movement to his lip or tongue. Conjunctiva is pink. Sclera is nonicteric Oropharyngeal mucosa is normal. No neck masses or thyromegaly noted. Cardiac exam with regular rhythm normal S1 and S2. No murmur or gallop noted. Lungs are clear to auscultation. Abdomen is full.  He has upper midline scar.  Bowel sounds are normal.  No succussion splash noted.  Abdomen is soft and nontender with organomegaly or masses.  I was not able to appreciate ventral hernia either in supine or standing position. No LE edema or clubbing noted. He does not have tremors to his hands or heads.  Labs/studies Results:   Abdominal pelvic CT without contrast performed on 04/25/2019. Show small sliding hiatal hernia hernia involving rectus sheath containing fat and colon to the left of midline colonic diverticulosis and cluster of calcifications in the pancreatic head or uncinate process measuring 1.3 cm.  No adenopathy or dilated ducts.  He also had left hip process and hardware in his lumbar region indicative of prior lumbar spine  fusion.  Please note that I was able to review images with help of Dr. Lavonia Dana.  CT abdomen pelvis from July 2020 reviewed and shows calcifications. There also appears to be calcification but may be not as pronounced on CT of 05/31/16  Assessment:  #1.  Chronic GERD/gastroparesis.  He remains on PPI low-dose metoclopramide and gastroparesis diet and appears to be doing well.  He is not having any side effects with motility agent.  He failed domperidone.  He has severe gastroparesis based on 2 gastric emptying studies.  Suspect gastroparesis is primarily due to vagal injury possibly resulting from multiple GI surgeries.  #2.  History of short segment Barrett's esophagus.  Biopsies are negative on EGD of September 2019.  This may be due to the fact that he had multiple biopsy in the past less likely to be sampling error.  #3.  Pancreatic calcification  involving uncinate process.  Apparently this is not a new finding.  He has no history of pancreatitis or pancreatic disease.  I wonder if he has had unrecognized episode of pancreatitis in the past or may be resulting from trauma during one of his GI surgeries.  There does not appear to be a mass.  May consider pancreatic CT in 1 year.  #4.  Ventral hernia into left rectus sheath.  I was not able to palpate this hernia in supine or standing position but clearly can be seen on CT.  I do not believe this hernia has anything to do with his pain but he should consider having it fixed before he gets complications.  #5.  Left lumbar pain.  There is nothing to suggest that this is a GI pain.  I believe this pain is referred from his back and he would benefit by seeing his back specialist/neurosurgeon Dr. Arnoldo Morale.  #6.  Constipation.  He is up-to-date on screening for CRC.  He can take polyethylene glycol.   Plan:  Consultation with Dr. Arnoldo Morale regarding back pain. Polyethylene glycol 8.5 to 17 g by mouth daily or every other day. Office visit  in 6 months. Focus pancreatic CT in 1 year. Office visit in 6 months.

## 2019-05-24 NOTE — Patient Instructions (Signed)
Will arrange office visit with Dr. Arnoldo Morale for further evaluation of left flank pain.

## 2019-05-31 ENCOUNTER — Telehealth: Payer: Self-pay | Admitting: Nurse Practitioner

## 2019-05-31 ENCOUNTER — Other Ambulatory Visit: Payer: Self-pay | Admitting: Neurosurgery

## 2019-05-31 DIAGNOSIS — M545 Low back pain, unspecified: Secondary | ICD-10-CM

## 2019-05-31 DIAGNOSIS — I10 Essential (primary) hypertension: Secondary | ICD-10-CM | POA: Diagnosis not present

## 2019-05-31 DIAGNOSIS — M5416 Radiculopathy, lumbar region: Secondary | ICD-10-CM | POA: Diagnosis not present

## 2019-05-31 DIAGNOSIS — G8929 Other chronic pain: Secondary | ICD-10-CM | POA: Diagnosis not present

## 2019-05-31 NOTE — Telephone Encounter (Signed)
Phone call to patient to verify medication list and allergies for myelogram procedure. Pt instructed to hold Reglan for 48hrs prior to myelogram appointment time. Pt also aware he will need to be off his goody powders for at least 3 days prior to procedure. In addition, pt will need to hold his plavix for this procedure, pending approval and recommended hold time from cardiologist Dr. Abran Richard. Pt verbalized understanding. Pre and post procedure instructions reviewed with pt. Faxed thinner hold request to Dr. Otho Perl, awaiting reply.

## 2019-06-02 DIAGNOSIS — I10 Essential (primary) hypertension: Secondary | ICD-10-CM | POA: Diagnosis not present

## 2019-06-02 DIAGNOSIS — G894 Chronic pain syndrome: Secondary | ICD-10-CM | POA: Diagnosis not present

## 2019-06-02 DIAGNOSIS — M1991 Primary osteoarthritis, unspecified site: Secondary | ICD-10-CM | POA: Diagnosis not present

## 2019-06-08 ENCOUNTER — Other Ambulatory Visit: Payer: Self-pay

## 2019-06-08 DIAGNOSIS — I739 Peripheral vascular disease, unspecified: Secondary | ICD-10-CM

## 2019-06-09 ENCOUNTER — Inpatient Hospital Stay (HOSPITAL_COMMUNITY): Payer: Medicare Other | Attending: Hematology

## 2019-06-09 ENCOUNTER — Ambulatory Visit (INDEPENDENT_AMBULATORY_CARE_PROVIDER_SITE_OTHER): Payer: Medicare Other | Admitting: Vascular Surgery

## 2019-06-09 ENCOUNTER — Ambulatory Visit (HOSPITAL_COMMUNITY)
Admission: RE | Admit: 2019-06-09 | Discharge: 2019-06-09 | Disposition: A | Discharge status: Home / Self Care | Payer: Medicare Other | Source: Ambulatory Visit | Attending: Vascular Surgery | Admitting: Vascular Surgery

## 2019-06-09 ENCOUNTER — Other Ambulatory Visit (HOSPITAL_COMMUNITY): Payer: Medicare Other

## 2019-06-09 ENCOUNTER — Encounter: Payer: Self-pay | Admitting: Vascular Surgery

## 2019-06-09 ENCOUNTER — Other Ambulatory Visit: Payer: Self-pay

## 2019-06-09 VITALS — BP 154/89 | HR 62 | Temp 97.9°F | Resp 20 | Ht 72.0 in | Wt 245.0 lb

## 2019-06-09 DIAGNOSIS — I739 Peripheral vascular disease, unspecified: Secondary | ICD-10-CM | POA: Diagnosis not present

## 2019-06-09 DIAGNOSIS — Z8249 Family history of ischemic heart disease and other diseases of the circulatory system: Secondary | ICD-10-CM | POA: Diagnosis not present

## 2019-06-09 DIAGNOSIS — M545 Low back pain: Secondary | ICD-10-CM | POA: Insufficient documentation

## 2019-06-09 DIAGNOSIS — Z885 Allergy status to narcotic agent status: Secondary | ICD-10-CM | POA: Diagnosis not present

## 2019-06-09 DIAGNOSIS — I728 Aneurysm of other specified arteries: Secondary | ICD-10-CM

## 2019-06-09 DIAGNOSIS — Z808 Family history of malignant neoplasm of other organs or systems: Secondary | ICD-10-CM | POA: Insufficient documentation

## 2019-06-09 DIAGNOSIS — Z87891 Personal history of nicotine dependence: Secondary | ICD-10-CM | POA: Diagnosis not present

## 2019-06-09 DIAGNOSIS — Z8719 Personal history of other diseases of the digestive system: Secondary | ICD-10-CM | POA: Diagnosis not present

## 2019-06-09 DIAGNOSIS — D696 Thrombocytopenia, unspecified: Secondary | ICD-10-CM | POA: Diagnosis not present

## 2019-06-09 DIAGNOSIS — M1990 Unspecified osteoarthritis, unspecified site: Secondary | ICD-10-CM | POA: Diagnosis not present

## 2019-06-09 DIAGNOSIS — K219 Gastro-esophageal reflux disease without esophagitis: Secondary | ICD-10-CM | POA: Insufficient documentation

## 2019-06-09 DIAGNOSIS — Z79899 Other long term (current) drug therapy: Secondary | ICD-10-CM | POA: Insufficient documentation

## 2019-06-09 DIAGNOSIS — Z85820 Personal history of malignant melanoma of skin: Secondary | ICD-10-CM | POA: Insufficient documentation

## 2019-06-09 DIAGNOSIS — K5900 Constipation, unspecified: Secondary | ICD-10-CM | POA: Insufficient documentation

## 2019-06-09 LAB — COMPREHENSIVE METABOLIC PANEL
ALT: 21 U/L (ref 0–44)
AST: 25 U/L (ref 15–41)
Albumin: 4.2 g/dL (ref 3.5–5.0)
Alkaline Phosphatase: 54 U/L (ref 38–126)
Anion gap: 9 (ref 5–15)
BUN: 18 mg/dL (ref 8–23)
CO2: 26 mmol/L (ref 22–32)
Calcium: 9.1 mg/dL (ref 8.9–10.3)
Chloride: 105 mmol/L (ref 98–111)
Creatinine, Ser: 1.18 mg/dL (ref 0.61–1.24)
GFR calc Af Amer: >60 mL/min (ref >60)
GFR calc non Af Amer: >60 mL/min (ref >60)
Glucose, Bld: 96 mg/dL (ref 70–99)
Potassium: 4 mmol/L (ref 3.5–5.1)
Sodium: 140 mmol/L (ref 135–145)
Total Bilirubin: 0.9 mg/dL (ref 0.3–1.2)
Total Protein: 7.1 g/dL (ref 6.5–8.1)

## 2019-06-09 LAB — CBC WITH DIFFERENTIAL/PLATELET
Abs Immature Granulocytes: 0.02 10*3/uL (ref 0.00–0.07)
Basophils Absolute: 0 10*3/uL (ref 0.0–0.1)
Basophils Relative: 0 %
Eosinophils Absolute: 0 10*3/uL (ref 0.0–0.5)
Eosinophils Relative: 0 %
HCT: 42.8 % (ref 39.0–52.0)
Hemoglobin: 14.1 g/dL (ref 13.0–17.0)
Immature Granulocytes: 1 %
Lymphocytes Relative: 35 %
Lymphs Abs: 1 10*3/uL (ref 0.7–4.0)
MCH: 30.8 pg (ref 26.0–34.0)
MCHC: 32.9 g/dL (ref 30.0–36.0)
MCV: 93.4 fL (ref 80.0–100.0)
Monocytes Absolute: 0.3 10*3/uL (ref 0.1–1.0)
Monocytes Relative: 10 %
Neutro Abs: 1.5 10*3/uL — ABNORMAL LOW (ref 1.7–7.7)
Neutrophils Relative %: 54 %
Platelets: 73 10*3/uL — ABNORMAL LOW (ref 150–400)
RBC: 4.58 MIL/uL (ref 4.22–5.81)
RDW: 12.9 % (ref 11.5–15.5)
WBC: 2.7 10*3/uL — ABNORMAL LOW (ref 4.0–10.5)
nRBC: 0 % (ref 0.0–0.2)

## 2019-06-09 LAB — LACTATE DEHYDROGENASE: LDH: 141 U/L (ref 98–192)

## 2019-06-09 NOTE — Progress Notes (Signed)
Referring Physician: Dr Gerarda Fraction  Patient name: [name] MRN: [id number] DOB: [birth year] Sex: male  REASON FOR CONSULT: Splenic artery aneurysm  HPI: [name] is a 69 y.o. male, who recently had a CT scan for other reasons.  He was noted on that CT scan to have an area of calcification near the head of the pancreas that could be consistent with aneurysm.  This was a noncontrast CT.  The patient currently has no abdominal pain.  He does have a family history of cerebral aneurysms in 2 brothers his mother and sister.  He personally has a history of a 4.6 cm ascending aortic aneurysm that is followed by his cardiologist in [place].  Other medical problems include coronary artery disease, elevated cholesterol and hypertension.  All of these have been stable.  He is on Plavix aspirin and a statin.  Past Medical History:  Diagnosis Date  . Anxiety    patient denies  . Aortic aneurysm (HCC)    ascending  . Arthritis   . Barrett esophagus   . Blood transfusion   . Cancer ([facility]) 2007   melanoma stage 3 stomach bx  . Coronary artery disease    mild by 09/08/13 cath [facility]  . GERD (gastroesophageal reflux disease)    barretts esophagus  . H/O hiatal hernia   . Headache(784.0)   . Hypercholesteremia   . Hypertension   . PONV (postoperative nausea and vomiting)    trouble breathing after last surgery (at [facility])  . Shingles    10  . Sleep apnea    not tested  . Tumor liver    no problems   Past Surgical History:  Procedure Laterality Date  . ABDOMINAL SURGERY     x3  . back surgeries     x 9  . BACK SURGERY     x8  . BIOPSY N/A 03/28/2013   Procedure: Distal Esophageal Biopsy;  Surgeon: Rogene Houston, MD;  Location: [facility];  Service: Endoscopy;  Laterality: N/A;  . BIOPSY  05/30/2016   Procedure: BIOPSY;  Surgeon: Rogene Houston, MD;  [facility];  Service: Endoscopy;;  . BIOPSY  02/19/2018   Procedure: BIOPSY;  Surgeon: Rogene Houston, MD;  Location: [facility];  Service: Endoscopy;;  Barret's esophagus  . CHEST SURGERY     lung incisional hernia for hernia  . COLONOSCOPY WITH PROPOFOL N/A 02/19/2018   Procedure: COLONOSCOPY WITH PROPOFOL;  Surgeon: Rogene Houston, MD;  [facility];  Service: Endoscopy;  Laterality: N/A;  1:00  . ESOPHAGOGASTRODUODENOSCOPY (EGD) WITH PROPOFOL N/A 03/28/2013   Procedure: ESOPHAGOGASTRODUODENOSCOPY (EGD) WITH PROPOFOL;  Surgeon: Rogene Houston, MD;  Location: [facility];  Service: Endoscopy;  Laterality: N/A;  GE junction @ 38, proximal  margin @37   . ESOPHAGOGASTRODUODENOSCOPY (EGD) WITH PROPOFOL N/A 05/30/2016   Procedure: ESOPHAGOGASTRODUODENOSCOPY (EGD) WITH PROPOFOL;  Surgeon: Rogene Houston, MD;  [facility];  Service: Endoscopy;  Laterality: N/A;  955  . ESOPHAGOGASTRODUODENOSCOPY (EGD) WITH PROPOFOL N/A 02/19/2018   Procedure: ESOPHAGOGASTRODUODENOSCOPY (EGD) WITH PROPOFOL;  Surgeon: Rogene Houston, MD;  [facility];  Service: Endoscopy;  Laterality: N/A;  . HEMORRHOID SURGERY     x2  . HERNIA REPAIR    . HIATAL HERNIA REPAIR    . JOINT REPLACEMENT     lft knee  . KNEE ARTHROPLASTY     lft  . KNEE ARTHROSCOPY Right   . LEG  SURGERY     x4 crushed   . LITHOTRIPSY    . MELANOMA EXCISION     lft abdomen  . NECK SURGERY  2012  . POLYPECTOMY  02/19/2018   Procedure: POLYPECTOMY;  Surgeon: Rogene Houston, MD;  [facility];  Service: Endoscopy;;  proximal transverse colon (CB x1), descending colon (CBx3)  . POSTERIOR CERVICAL FUSION/FORAMINOTOMY  08/18/2011   Procedure: POSTERIOR CERVICAL FUSION/FORAMINOTOMY LEVEL 3;  Surgeon: Ophelia Charter, MD;  Location: [facility];  Service: Neurosurgery;  Laterality: N/A;  Cervical three-four, four-five, five-six posterior cervical  fusion with instrumentation; Right cervical three-four, four-five laminotomy  . REVISION TOTAL HIP ARTHROPLASTY Left   . SHOULDER ARTHROSCOPY     rt bith defect , spur, rotator  cuff  . SPINAL CORD STIMULATOR IMPLANT     rt hip  . SPINAL CORD STIMULATOR INSERTION N/A 11/03/2016   Procedure: LUMBAR SPINAL CORD STIMULATOR REVISION;  Surgeon: Newman Pies, MD;  Location: [facility];  Service: Neurosurgery;  Laterality: N/A;  . THYROID LOBECTOMY     rt    Family History  Problem Relation Age of Onset  . Heart disease Father   . Aneurysm Mother   . Aneurysm Sister   . Heart disease Paternal Grandmother   . Heart disease Paternal Grandfather   . Heart disease Maternal Grandfather   . Heart disease Brother        heart transplant  . Aneurysm Other        pt states several members have aortic aneurysm  . Skin cancer Other        several members  . Lymphoma Brother   . Aneurysm Maternal Grandmother     SOCIAL HISTORY: Social History   Socioeconomic History  . Marital status: Married    Spouse name: Not on file  . Number of children: Not on file  . Years of education: Not on file  . Highest education level: Not on file  Occupational History  . Occupation: retired  Tobacco Use  . Smoking status: Former Smoker    Packs/day: 4.00    Years: 10.00    Pack years: 40.00    Types: Cigarettes    Quit date: 08/12/1980    Years since quitting: 38.8  . Smokeless tobacco: Never Used  . Tobacco comment: occ wine  Substance and Sexual Activity  . Alcohol use: Yes    Alcohol/week: 0.0 standard drinks    Comment: rarely  . Drug use: No  . Sexual activity: Yes    Birth control/protection: None  Other Topics Concern  . Not on file  Social History Narrative  . Not on file   Social Determinants of Health   Financial Resource Strain:   . Difficulty of Paying Living Expenses: Not on file  Food Insecurity:   . Worried About Charity fundraiser in the Last Year: Not on file  . Ran Out of Food in the Last Year: Not on file  Transportation Needs:   . Lack of Transportation (Medical): Not on file  . Lack of Transportation (Non-Medical): Not on file  Physical  Activity:   . Days of Exercise per Week: Not on file  . Minutes of Exercise per Session: Not on file  Stress:   . Feeling of Stress : Not on file  Social Connections:   . Frequency of Communication with Friends and Family: Not on file  . Frequency of Social Gatherings with Friends and Family: Not on file  .  Attends Religious Services: Not on file  . Active Member of Clubs or Organizations: Not on file  . Attends Archivist Meetings: Not on file  . Marital Status: Not on file  Intimate Partner Violence:   . Fear of Current or Ex-Partner: Not on file  . Emotionally Abused: Not on file  . Physically Abused: Not on file  . Sexually Abused: Not on file    Allergies  Allergen Reactions  . Demerol Other (See Comments)    "Makes me crazy."  . Contrast Media [Iodinated Diagnostic Agents] Nausea Only    Current Outpatient Medications  Medication Sig Dispense Refill  . aspirin EC 81 MG tablet Take 81 mg by mouth. Patient states that he takes 3 times a week    . Aspirin-Acetaminophen-Caffeine (GOODY HEADACHE PO) Take 1-2 packets by mouth daily as needed (headaches).    Marland Kitchen Bioflavonoid Products (ESTER C PO) Take 1 tablet by mouth daily.    . calcium-vitamin D (OSCAL WITH D) 500-200 MG-UNIT per tablet Take 1 tablet by mouth 2 (two) times daily.     . clopidogrel (PLAVIX) 75 MG tablet TAKE 1 TABLET BY MOUTH ONCE DAILY START DATE 9 10 2020    . cyclobenzaprine (FLEXERIL) 10 MG tablet Take 1 tablet (10 mg total) by mouth 3 (three) times daily as needed for muscle spasms. 50 tablet 1  . dexlansoprazole (DEXILANT) 60 MG capsule Take 60 mg by mouth daily.     . diazepam (VALIUM) 10 MG tablet Take 10 mg by mouth 4 (four) times daily as needed (for back pain).     . fexofenadine (ALLEGRA) 180 MG tablet Take 180 mg by mouth every evening.    . fish oil-omega-3 fatty acids 1000 MG capsule Take 1 g by mouth 2 (two) times daily.    Marland Kitchen lisinopril (PRINIVIL,ZESTRIL) 10 MG tablet Take 10 mg by mouth  daily.    . metoCLOPramide (REGLAN) 10 MG tablet TAKE ONE TABLET BY MOUTH TWICE A DAY AS NEEDED FOR NAUSEA 60 tablet 2  . montelukast (SINGULAIR) 10 MG tablet Take 10 mg by mouth daily.     . Multiple Vitamin (MULITIVITAMIN WITH MINERALS) TABS Take 1 tablet by mouth daily.    . ondansetron (ZOFRAN) 4 MG tablet Take 1 tablet (4 mg total) by mouth every 8 (eight) hours as needed for nausea or vomiting. 6 tablet 0  . oxyCODONE-acetaminophen (PERCOCET) 10-325 MG tablet Take 1 tablet by mouth every 6 (six) hours as needed for pain.    . polyethylene glycol powder (GLYCOLAX/MIRALAX) 17 GM/SCOOP powder Take 8.5 g by mouth daily. 255 g 0  . rosuvastatin (CRESTOR) 10 MG tablet Take 10 mg by mouth every evening.      No current facility-administered medications for this visit.    ROS:   General:  No weight loss, Fever, chills  HEENT: No recent headaches, no nasal bleeding, no visual changes, no sore throat  Neurologic: No dizziness, blackouts, seizures. No recent symptoms of stroke or mini- stroke. No recent episodes of slurred speech, or temporary blindness.  Cardiac: No recent episodes of chest pain/pressure, no shortness of breath at rest.  No shortness of breath with exertion.  Denies history of atrial fibrillation or irregular heartbeat  Vascular: No history of rest pain in feet.  No history of claudication.  No history of non-healing ulcer, No history of DVT   Pulmonary: No home oxygen, no productive cough, no hemoptysis,  No asthma or wheezing  Musculoskeletal:  [ ]   Arthritis, [X]  Low back pain,  [ ]  Joint pain  Hematologic:No history of hypercoagulable state.  No history of easy bleeding.  No history of anemia  Gastrointestinal: No hematochezia or melena,  No gastroesophageal reflux, no trouble swallowing  Urinary: [ ]  chronic Kidney disease, [ ]  on HD - [ ]  MWF or [ ]  TTHS, [ ]  Burning with urination, [ ]  Frequent urination, [ ]  Difficulty urinating;   Skin: No  rashes  Psychological: No history of anxiety,  No history of depression   Physical Examination  Vitals:   06/09/19 0825  BP: (!) 154/89  Pulse: 62  Resp: 20  Temp: 97.9 F (36.6 C)  SpO2: 97%  Weight: 245 lb (111.1 kg)  Height: 6' (1.829 m)    Body mass index is 33.23 kg/m.  General:  Alert and oriented, no acute distress HEENT: Normal Neck: No JVD Cardiac: Regular Rate and Rhythm  Abdomen: Soft, non-tender, non-distended, no mass Skin: No rash Extremity Pulses:  2+ radial, brachial, femoral, dorsalis pedis, posterior tibial pulses bilaterally Musculoskeletal: No deformity or edema  Neurologic: Upper and lower extremity motor 5/5 and symmetric  DATA:  I reviewed the images from the patient's CT scan from 6 months ago as well as his most recent CT scan done at [facility].  This was a few weeks ago.  This shows an area of calcification adjacent to the head of pancreas most likely it is either a small splenic aneurysm from a prior episode of pancreatitis or calcification in the pancreas itself.  It is difficult to determine from either of those CT scans.  However, this is only 1 cm in diameter.  It is not a lesion that would necessarily be approachable with operative therapy and would only be considered for repair if it were greater than 3 cm in diameter.  He had no evidence of other visceral aneurysms or infrarenal abdominal aortic aneurysm or iliac aneurysm.  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  The patient will follow-up with me on an as-needed basis.  He will touch base with his GI doctor regarding whether or not this may be pancreatic calcification and needs any further work-up from that standpoint.   Ruta Hinds, MD [facility] Office: [phone] Pager: [phone]

## 2019-06-10 ENCOUNTER — Other Ambulatory Visit: Payer: Self-pay | Admitting: Neurosurgery

## 2019-06-10 DIAGNOSIS — M5416 Radiculopathy, lumbar region: Secondary | ICD-10-CM

## 2019-06-10 DIAGNOSIS — M546 Pain in thoracic spine: Secondary | ICD-10-CM

## 2019-06-10 LAB — PROTEIN ELECTROPHORESIS, SERUM
A/G Ratio: 1.8 — ABNORMAL HIGH (ref 0.7–1.7)
Albumin ELP: 4.4 g/dL (ref 2.9–4.4)
Alpha-1-Globulin: 0.2 g/dL (ref 0.0–0.4)
Alpha-2-Globulin: 0.5 g/dL (ref 0.4–1.0)
Beta Globulin: 0.8 g/dL (ref 0.7–1.3)
Gamma Globulin: 1 g/dL (ref 0.4–1.8)
Globulin, Total: 2.5 g/dL (ref 2.2–3.9)
Total Protein ELP: 6.9 g/dL (ref 6.0–8.5)

## 2019-06-13 ENCOUNTER — Ambulatory Visit
Admission: RE | Admit: 2019-06-13 | Discharge: 2019-06-13 | Disposition: A | Discharge status: Home / Self Care | Payer: Medicare Other | Source: Ambulatory Visit | Attending: Neurosurgery | Admitting: Neurosurgery

## 2019-06-13 DIAGNOSIS — M47817 Spondylosis without myelopathy or radiculopathy, lumbosacral region: Secondary | ICD-10-CM | POA: Diagnosis not present

## 2019-06-13 DIAGNOSIS — M546 Pain in thoracic spine: Secondary | ICD-10-CM

## 2019-06-13 DIAGNOSIS — M5416 Radiculopathy, lumbar region: Secondary | ICD-10-CM

## 2019-06-13 DIAGNOSIS — I700 Atherosclerosis of aorta: Secondary | ICD-10-CM | POA: Diagnosis not present

## 2019-06-13 DIAGNOSIS — K449 Diaphragmatic hernia without obstruction or gangrene: Secondary | ICD-10-CM | POA: Diagnosis not present

## 2019-06-13 DIAGNOSIS — I2510 Atherosclerotic heart disease of native coronary artery without angina pectoris: Secondary | ICD-10-CM | POA: Diagnosis not present

## 2019-06-13 DIAGNOSIS — M4326 Fusion of spine, lumbar region: Secondary | ICD-10-CM | POA: Diagnosis not present

## 2019-06-13 DIAGNOSIS — M47815 Spondylosis without myelopathy or radiculopathy, thoracolumbar region: Secondary | ICD-10-CM | POA: Diagnosis not present

## 2019-06-13 DIAGNOSIS — M4315 Spondylolisthesis, thoracolumbar region: Secondary | ICD-10-CM | POA: Diagnosis not present

## 2019-06-13 DIAGNOSIS — M5144 Schmorl's nodes, thoracic region: Secondary | ICD-10-CM | POA: Diagnosis not present

## 2019-06-16 ENCOUNTER — Other Ambulatory Visit: Payer: Self-pay

## 2019-06-16 ENCOUNTER — Encounter (HOSPITAL_COMMUNITY): Payer: Self-pay | Admitting: Hematology

## 2019-06-16 ENCOUNTER — Inpatient Hospital Stay (HOSPITAL_BASED_OUTPATIENT_CLINIC_OR_DEPARTMENT_OTHER): Payer: Medicare Other | Admitting: Hematology

## 2019-06-16 VITALS — BP 148/91 | HR 71 | Temp 97.9°F | Resp 16 | Wt 243.0 lb

## 2019-06-16 DIAGNOSIS — Z808 Family history of malignant neoplasm of other organs or systems: Secondary | ICD-10-CM | POA: Diagnosis not present

## 2019-06-16 DIAGNOSIS — M1990 Unspecified osteoarthritis, unspecified site: Secondary | ICD-10-CM | POA: Diagnosis not present

## 2019-06-16 DIAGNOSIS — Z79899 Other long term (current) drug therapy: Secondary | ICD-10-CM | POA: Diagnosis not present

## 2019-06-16 DIAGNOSIS — K5900 Constipation, unspecified: Secondary | ICD-10-CM | POA: Diagnosis not present

## 2019-06-16 DIAGNOSIS — D696 Thrombocytopenia, unspecified: Secondary | ICD-10-CM | POA: Diagnosis not present

## 2019-06-16 DIAGNOSIS — M545 Low back pain: Secondary | ICD-10-CM | POA: Diagnosis not present

## 2019-06-16 DIAGNOSIS — K219 Gastro-esophageal reflux disease without esophagitis: Secondary | ICD-10-CM | POA: Diagnosis not present

## 2019-06-16 DIAGNOSIS — Z885 Allergy status to narcotic agent status: Secondary | ICD-10-CM | POA: Diagnosis not present

## 2019-06-16 DIAGNOSIS — Z8719 Personal history of other diseases of the digestive system: Secondary | ICD-10-CM | POA: Diagnosis not present

## 2019-06-16 DIAGNOSIS — Z8249 Family history of ischemic heart disease and other diseases of the circulatory system: Secondary | ICD-10-CM | POA: Diagnosis not present

## 2019-06-16 DIAGNOSIS — Z85820 Personal history of malignant melanoma of skin: Secondary | ICD-10-CM | POA: Diagnosis not present

## 2019-06-16 DIAGNOSIS — Z87891 Personal history of nicotine dependence: Secondary | ICD-10-CM | POA: Diagnosis not present

## 2019-06-16 MED ORDER — LACTULOSE 10 GM/15ML PO SOLN: 30.0000 g | Freq: Every day | ORAL | 2 refills | Status: DC

## 2019-06-16 MED ORDER — LACTULOSE ENCEPHALOPATHY 10 GM/15ML PO SOLN: 30.0000 g | Freq: Every day | ORAL | 0 refills | Status: DC

## 2019-06-16 NOTE — Patient Instructions (Signed)
[place] at [facility] Discharge Instructions  You were seen today by Dr. Delton Coombes. He went over your recent lab results. Start taking a stool softener ( Colace) every day. He will send in a prescription for lactulose to your pharmacy, take this as a back up if the colace doesn't help. He will see you back in 4 months for labs and follow up.   Thank you for choosing [place] at [facility] to provide your oncology and hematology care.  To afford each patient quality time with our provider, please arrive at least 15 minutes before your scheduled appointment time.   If you have a lab appointment with the [facility] please come in thru the  Main Entrance and check in at the main information desk  You need to re-schedule your appointment should you arrive 10 or more minutes late.  We strive to give you quality time with our providers, and arriving late affects you and other patients whose appointments are after yours.  Also, if you no show three or more times for appointments you may be dismissed from the clinic at the providers discretion.     Again, thank you for choosing [facility].  Our hope is that these requests will decrease the amount of time that you wait before being seen by our physicians.       _____________________________________________________________  Should you have questions after your visit to [facility], please contact our office at [phone] between the hours of 8:00 a.m. and 4:30 p.m.  Voicemails left after 4:00 p.m. will not be returned until the following business day.  For prescription refill requests, have your pharmacy contact our office and allow 72 hours.    Cancer Center Support Programs:   > Cancer Support Group  2nd Tuesday of the month 1pm-2pm, Journey Room

## 2019-06-16 NOTE — Progress Notes (Signed)
[address], [place] [id number]   CLINIC:  Medical Oncology/Hematology  PCP:  [facility], [place] [address] [phone]   REASON FOR VISIT:  Follow-up for thrombocytopenia.     INTERVAL HISTORY:  Mr. Metallo 69 y.o. male seen for follow-up of thrombocytopenia.  Denies any bleeding per rectum or melena.  Denies any hematuria or easy bruising.  Appetite is 100%.  Energy levels are 75%.  Lower back pain rated as 5 out of 10.  Constipation is reported.  REVIEW OF SYSTEMS:  Review of Systems  Gastrointestinal: Positive for constipation.  All other systems reviewed and are negative.    PAST MEDICAL/SURGICAL HISTORY:  Past Medical History:  Diagnosis Date  . Anxiety    patient denies  . Aortic aneurysm (HCC)    ascending  . Arthritis   . Barrett esophagus   . Blood transfusion   . Cancer ([place]) 2007   melanoma stage 3 stomach bx  . Coronary artery disease    mild by 09/08/13 cath [facility]  . GERD (gastroesophageal reflux disease)    barretts esophagus  . H/O hiatal hernia   . Headache(784.0)   . Hypercholesteremia   . Hypertension   . PONV (postoperative nausea and vomiting)    trouble breathing after last surgery (at [facility])  . Shingles    10  . Sleep apnea    not tested  . Tumor liver    no problems   Past Surgical History:  Procedure Laterality Date  . ABDOMINAL SURGERY     x3  . back surgeries     x 9  . BACK SURGERY     x8  . BIOPSY N/A 03/28/2013   Procedure: Distal Esophageal Biopsy;  Surgeon: Rogene Houston, MD;  Location: [facility];  Service: Endoscopy;  Laterality: N/A;  . BIOPSY  05/30/2016   Procedure: BIOPSY;  Surgeon: Rogene Houston, MD;  Location: [facility];  Service: Endoscopy;;  . BIOPSY  02/19/2018   Procedure: BIOPSY;  Surgeon: Rogene Houston, MD;  Location: [facility];  Service: Endoscopy;;  Barret's esophagus  . CHEST SURGERY     lung incisional hernia for hernia  .  COLONOSCOPY WITH PROPOFOL N/A 02/19/2018   Procedure: COLONOSCOPY WITH PROPOFOL;  Surgeon: Rogene Houston, MD;  Location: [facility];  Service: Endoscopy;  Laterality: N/A;  1:00  . ESOPHAGOGASTRODUODENOSCOPY (EGD) WITH PROPOFOL N/A 03/28/2013   Procedure: ESOPHAGOGASTRODUODENOSCOPY (EGD) WITH PROPOFOL;  Surgeon: Rogene Houston, MD;  Location: [facility];  Service: Endoscopy;  Laterality: N/A;  GE junction @ 38, proximal  margin _0   . ESOPHAGOGASTRODUODENOSCOPY (EGD) WITH PROPOFOL N/A 05/30/2016   Procedure: ESOPHAGOGASTRODUODENOSCOPY (EGD) WITH PROPOFOL;  Surgeon: Rogene Houston, MD;  [facility];  Service: Endoscopy;  Laterality: N/A;  955  . ESOPHAGOGASTRODUODENOSCOPY (EGD) WITH PROPOFOL N/A 02/19/2018   Procedure: ESOPHAGOGASTRODUODENOSCOPY (EGD) WITH PROPOFOL;  Surgeon: Rogene Houston, MD;  [facility];  Service: Endoscopy;  Laterality: N/A;  . HEMORRHOID SURGERY     x2  . HERNIA REPAIR    . HIATAL HERNIA REPAIR    . JOINT REPLACEMENT     lft knee  . KNEE ARTHROPLASTY     lft  . KNEE ARTHROSCOPY Right   . LEG SURGERY     x4 crushed   . LITHOTRIPSY    . MELANOMA EXCISION     lft abdomen  . NECK SURGERY  2012  .  POLYPECTOMY  02/19/2018   Procedure: POLYPECTOMY;  Surgeon: Rehman, Najeeb U, MD;  Location: [facility];  Service: Endoscopy;;  proximal transverse colon (CB x1), descending colon (CBx3)  . POSTERIOR CERVICAL FUSION/FORAMINOTOMY  08/18/2011   Procedure: POSTERIOR CERVICAL FUSION/FORAMINOTOMY LEVEL 3;  Surgeon: Jeffrey D Jenkins, MD;  Location: [facility];  Service: Neurosurgery;  Laterality: N/A;  Cervical three-four, four-five, five-six posterior cervical  fusion with instrumentation; Right cervical three-four, four-five laminotomy  . REVISION TOTAL HIP ARTHROPLASTY Left   . SHOULDER ARTHROSCOPY     rt bith defect , spur, rotator cuff  . SPINAL CORD STIMULATOR IMPLANT     rt hip  . SPINAL CORD STIMULATOR INSERTION N/A 11/03/2016   Procedure:  LUMBAR SPINAL CORD STIMULATOR REVISION;  Surgeon: Jenkins, Jeffrey, MD;  Location: [facility];  Service: Neurosurgery;  Laterality: N/A;  . THYROID LOBECTOMY     rt     SOCIAL HISTORY:  Social History   Socioeconomic History  . Marital status: Married    Spouse name: Not on file  . Number of children: Not on file  . Years of education: Not on file  . Highest education level: Not on file  Occupational History  . Occupation: retired  Tobacco Use  . Smoking status: Former Smoker    Packs/day: 4.00    Years: 10.00    Pack years: 40.00    Types: Cigarettes    Quit date: 08/12/1980    Years since quitting: 38.8  . Smokeless tobacco: Never Used  . Tobacco comment: occ wine  Substance and Sexual Activity  . Alcohol use: Yes    Alcohol/week: 0.0 standard drinks    Comment: rarely  . Drug use: No  . Sexual activity: Yes    Birth control/protection: None  Other Topics Concern  . Not on file  Social History Narrative  . Not on file   Social Determinants of Health   Financial Resource Strain:   . Difficulty of Paying Living Expenses: Not on file  Food Insecurity:   . Worried About Running Out of Food in the Last Year: Not on file  . Ran Out of Food in the Last Year: Not on file  Transportation Needs:   . Lack of Transportation (Medical): Not on file  . Lack of Transportation (Non-Medical): Not on file  Physical Activity:   . Days of Exercise per Week: Not on file  . Minutes of Exercise per Session: Not on file  Stress:   . Feeling of Stress : Not on file  Social Connections:   . Frequency of Communication with Friends and Family: Not on file  . Frequency of Social Gatherings with Friends and Family: Not on file  . Attends Religious Services: Not on file  . Active Member of Clubs or Organizations: Not on file  . Attends Club or Organization Meetings: Not on file  . Marital Status: Not on file  Intimate Partner Violence:   . Fear of Current or Ex-Partner: Not on file  .  Emotionally Abused: Not on file  . Physically Abused: Not on file  . Sexually Abused: Not on file    FAMILY HISTORY:  Family History  Problem Relation Age of Onset  . Heart disease Father   . Aneurysm Mother   . Aneurysm Sister   . Heart disease Paternal Grandmother   . Heart disease Paternal Grandfather   . Heart disease Maternal Grandfather   . Heart disease Brother        heart   transplant  . Aneurysm Other        pt states several members have aortic aneurysm  . Skin cancer Other        several members  . Lymphoma Brother   . Aneurysm Maternal Grandmother     CURRENT MEDICATIONS:  Outpatient Encounter Medications as of 06/16/2019  Medication Sig Note  . aspirin EC 81 MG tablet Take 81 mg by mouth. Patient states that he takes 3 times a week   . Aspirin-Acetaminophen-Caffeine (GOODY HEADACHE PO) Take 1-2 packets by mouth daily as needed (headaches). 05/24/2019: Takes only as absolutely needed - for headache  . Bioflavonoid Products (ESTER C PO) Take 1 tablet by mouth daily.   . calcium-vitamin D (OSCAL WITH D) 500-200 MG-UNIT per tablet Take 1 tablet by mouth 2 (two) times daily.    . clopidogrel (PLAVIX) 75 MG tablet TAKE 1 TABLET BY MOUTH ONCE DAILY START DATE 9 10 2020   . cyclobenzaprine (FLEXERIL) 10 MG tablet Take 1 tablet (10 mg total) by mouth 3 (three) times daily as needed for muscle spasms. 05/24/2019: As needed for muscle spasms  . dexlansoprazole (DEXILANT) 60 MG capsule Take 60 mg by mouth daily.    . diazepam (VALIUM) 10 MG tablet Take 10 mg by mouth 4 (four) times daily as needed (for back pain).    . fexofenadine (ALLEGRA) 180 MG tablet Take 180 mg by mouth every evening.   . fish oil-omega-3 fatty acids 1000 MG capsule Take 1 g by mouth 2 (two) times daily.   Marland Kitchen lisinopril (PRINIVIL,ZESTRIL) 10 MG tablet Take 10 mg by mouth daily.   . metoCLOPramide (REGLAN) 10 MG tablet TAKE ONE TABLET BY MOUTH TWICE A DAY AS NEEDED FOR NAUSEA   . montelukast (SINGULAIR) 10  MG tablet Take 10 mg by mouth daily.    . Multiple Vitamin (MULITIVITAMIN WITH MINERALS) TABS Take 1 tablet by mouth daily.   . ondansetron (ZOFRAN) 4 MG tablet Take 1 tablet (4 mg total) by mouth every 8 (eight) hours as needed for nausea or vomiting. 11/17/2018: AS NEEDED.  Marland Kitchen oxyCODONE-acetaminophen (PERCOCET) 10-325 MG tablet Take 1 tablet by mouth every 6 (six) hours as needed for pain.   . rosuvastatin (CRESTOR) 10 MG tablet Take 10 mg by mouth every evening.    . [DISCONTINUED] polyethylene glycol powder (GLYCOLAX/MIRALAX) 17 GM/SCOOP powder Take 8.5 g by mouth daily.   Marland Kitchen lactulose, encephalopathy, (CHRONULAC) 10 GM/15ML SOLN Take 45 mLs (30 g total) by mouth at bedtime.   . [DISCONTINUED] lactulose (CHRONULAC) 10 GM/15ML solution Take 45 mLs (30 g total) by mouth at bedtime.    No facility-administered encounter medications on file as of 06/16/2019.    ALLERGIES:  Allergies  Allergen Reactions  . Demerol Other (See Comments)    "Makes me crazy."  . Contrast Media [Iodinated Diagnostic Agents] Nausea Only     PHYSICAL EXAM:  ECOG Performance status: 1  Vitals:   06/16/19 1500  BP: (!) 148/91  Pulse: 71  Resp: 16  Temp: 97.9 F (36.6 C)  SpO2: 95%   Filed Weights   06/16/19 1500  Weight: 243 lb (110.2 kg)    Physical Exam Vitals reviewed.  Constitutional:      Appearance: Normal appearance.  Cardiovascular:     Rate and Rhythm: Normal rate and regular rhythm.     Heart sounds: Normal heart sounds.  Pulmonary:     Effort: Pulmonary effort is normal.     Breath sounds: Normal breath sounds.  Abdominal:     General: There is no distension.     Palpations: Abdomen is soft. There is no mass.  Musculoskeletal:        General: No swelling.  Skin:    General: Skin is warm.  Neurological:     General: No focal deficit present.     Mental Status: He is alert and oriented to person, place, and time.  Psychiatric:        Mood and Affect: Mood normal.         Behavior: Behavior normal.      LABORATORY DATA:  I have reviewed the labs as listed.  CBC    Component Value Date/Time   WBC 2.7 (L) 06/09/2019 1012   RBC 4.58 06/09/2019 1012   HGB 14.1 06/09/2019 1012   HCT 42.8 06/09/2019 1012   PLT 73 (L) 06/09/2019 1012   MCV 93.4 06/09/2019 1012   MCH 30.8 06/09/2019 1012   MCHC 32.9 06/09/2019 1012   RDW 12.9 06/09/2019 1012   LYMPHSABS 1.0 06/09/2019 1012   MONOABS 0.3 06/09/2019 1012   EOSABS 0.0 06/09/2019 1012   BASOSABS 0.0 06/09/2019 1012   CMP Latest Ref Rng & Units 06/09/2019 03/01/2019 10/29/2018  Glucose 70 - 99 mg/dL 96 96 95  BUN 8 - 23 mg/dL 18 21 29(H)  Creatinine 0.61 - 1.24 mg/dL 1.18 1.11 1.22  Sodium 135 - 145 mmol/L 140 139 139  Potassium 3.5 - 5.1 mmol/L 4.0 4.2 3.9  Chloride 98 - 111 mmol/L 105 108 105  CO2 22 - 32 mmol/L 26 23 21(L)  Calcium 8.9 - 10.3 mg/dL 9.1 9.3 9.5  Total Protein 6.5 - 8.1 g/dL 7.1 - 7.9  Total Bilirubin 0.3 - 1.2 mg/dL 0.9 - 1.0  Alkaline Phos 38 - 126 U/L 54 - 79  AST 15 - 41 U/L 25 - 30  ALT 0 - 44 U/L 21 - 28       DIAGNOSTIC IMAGING:  I have independently reviewed the scans and discussed with the patient.   I have reviewed Venita Lick LPN's note and agree with the documentation.  I personally performed a face-to-face visit, made revisions and my assessment and plan is as follows.    ASSESSMENT & PLAN:   Thrombocytopenia ([place]) 1.  Moderate thrombocytopenia: -Referral from Dr. Nolon Rod office for decreased platelet count of 80 on CBC 10/21/2018. -Denies any easy bruising or bleeding.  One episode of nosebleed in 2019 and had to undergo cauterization. -Denies any fevers, night sweats or weight loss.  Ex-smoker, quit in 1982, smoked 1 to 2 packs/day for 10 years.  He also spent 2 years at [facility]. -Previous testing including LDH, B12, copper, hepatitis panel, HIV test and H. pylori test were normal. -CT abdomen on 11/08/2018 showed normal-sized spleen with no abdominal or  pelvic pathology. -CBC on 03/01/2019 showed platelet count of 81 and white count of 3.1 and ANC of 1700.  Hemoglobin was normal. -Bone marrow biopsy on 03/01/2019 showed slightly hypercellular for age with trilineage hematopoiesis including abundant megakaryocytes with predominantly normal morphology.  Overall myeloid changes are nonspecific and not diagnostic of MDS. -Chromosome analysis was normal.  MDS FISH panel was normal. -The core biopsy showed small lymphoid aggregates, the features are not considered specific or diagnostic of a lymphoproliferative process. 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 -We will consider sending NGS panel if there is any significant change.  SPEP was negative. -We reviewed labs which showed platelet count 73.  He  does not have any bleeding symptoms. -We will see him back in 4 months for follow-up and repeat platelet count. -We will send a prescription for lactulose 30 mL at bedtime as needed for constipation.  He will use stool softeners first.  If they do not work he will use lactulose.  2.  Malignant melanoma: -Melanoma resected on the left side of the abdomen at [facility] 14 years ago. -Reportedly stage III.  He had axillary sentinel lymph node biopsy done.   Orders placed this encounter:  Orders Placed This Encounter  Procedures  . CBC with Differential/Platelet  . Lactate dehydrogenase      Derek Jack, MD [place] [phone]

## 2019-06-16 NOTE — Assessment & Plan Note (Addendum)
1.  Moderate thrombocytopenia: -Referral from Dr. Nolon Rod office for decreased platelet count of 80 on CBC 10/21/2018. -Denies any easy bruising or bleeding.  One episode of nosebleed in 2019 and had to undergo cauterization. -Denies any fevers, night sweats or weight loss.  Ex-smoker, quit in 1982, smoked 1 to 2 packs/day for 10 years.  He also spent 2 years at [facility]. -Previous testing including LDH, B12, copper, hepatitis panel, HIV test and H. pylori test were normal. -CT abdomen on 11/08/2018 showed normal-sized spleen with no abdominal or pelvic pathology. -CBC on 03/01/2019 showed platelet count of 81 and white count of 3.1 and ANC of 1700.  Hemoglobin was normal. -Bone marrow biopsy on 03/01/2019 showed slightly hypercellular for age with trilineage hematopoiesis including abundant megakaryocytes with predominantly normal morphology.  Overall myeloid changes are nonspecific and not diagnostic of MDS. -Chromosome analysis was normal.  MDS FISH panel was normal. -The core biopsy showed small lymphoid aggregates, the features are not considered specific or diagnostic of a lymphoproliferative process. 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 -We will consider sending NGS panel if there is any significant change.  SPEP was negative. -We reviewed labs which showed platelet count 73.  He does not have any bleeding symptoms. -We will see him back in 4 months for follow-up and repeat platelet count. -We will send a prescription for lactulose 30 mL at bedtime as needed for constipation.  He will use stool softeners first.  If they do not work he will use lactulose.  2.  Malignant melanoma: -Melanoma resected on the left side of the abdomen at [facility] 14 years ago. -Reportedly stage III.  He had axillary sentinel lymph node biopsy done.

## 2019-06-22 DIAGNOSIS — G4733 Obstructive sleep apnea (adult) (pediatric): Secondary | ICD-10-CM | POA: Diagnosis not present

## 2019-06-28 ENCOUNTER — Other Ambulatory Visit: Payer: Self-pay | Admitting: Surgery

## 2019-06-28 DIAGNOSIS — K439 Ventral hernia without obstruction or gangrene: Secondary | ICD-10-CM | POA: Diagnosis not present

## 2019-06-30 DIAGNOSIS — Z955 Presence of coronary angioplasty implant and graft: Secondary | ICD-10-CM | POA: Diagnosis not present

## 2019-06-30 DIAGNOSIS — D696 Thrombocytopenia, unspecified: Secondary | ICD-10-CM | POA: Diagnosis not present

## 2019-06-30 DIAGNOSIS — I2510 Atherosclerotic heart disease of native coronary artery without angina pectoris: Secondary | ICD-10-CM | POA: Diagnosis not present

## 2019-06-30 DIAGNOSIS — I7789 Other specified disorders of arteries and arterioles: Secondary | ICD-10-CM | POA: Diagnosis not present

## 2019-06-30 DIAGNOSIS — I10 Essential (primary) hypertension: Secondary | ICD-10-CM | POA: Diagnosis not present

## 2019-07-03 DIAGNOSIS — M1991 Primary osteoarthritis, unspecified site: Secondary | ICD-10-CM | POA: Diagnosis not present

## 2019-07-03 DIAGNOSIS — I10 Essential (primary) hypertension: Secondary | ICD-10-CM | POA: Diagnosis not present

## 2019-07-03 DIAGNOSIS — G894 Chronic pain syndrome: Secondary | ICD-10-CM | POA: Diagnosis not present

## 2019-07-23 DIAGNOSIS — G4733 Obstructive sleep apnea (adult) (pediatric): Secondary | ICD-10-CM | POA: Diagnosis not present

## 2019-08-16 ENCOUNTER — Other Ambulatory Visit: Payer: Self-pay | Admitting: Surgery

## 2019-08-16 DIAGNOSIS — K439 Ventral hernia without obstruction or gangrene: Secondary | ICD-10-CM | POA: Diagnosis not present

## 2019-08-20 DIAGNOSIS — G4733 Obstructive sleep apnea (adult) (pediatric): Secondary | ICD-10-CM | POA: Diagnosis not present

## 2019-08-21 IMAGING — NM NUCLEAR MEDICINE GASTRIC EMPTYING STUDY
10 series · 10 of 10 positions shown · non-contrast
Comparison: 06/05/2016

CLINICAL DATA: Nausea and vomiting, long history of stomach issues
with recent episode worsening beginning 2 months ago, early satiety,
history GERD and Barrett's esophagus

EXAM:
NUCLEAR MEDICINE GASTRIC EMPTYING SCAN
TECHNIQUE: After oral ingestion of radiolabeled meal, sequential abdominal
images were obtained for 4 hours. Percentage of activity emptying
the stomach was calculated at 1 hour, 2 hour, 3 hour, and 4 hours.
Study was performed with patient on domperidone.
RADIOPHARMACEUTICALS:  2.1 mCi Nc-JJm sulfur colloid in standardized
meal

[Series 1: 0 min · 4.14mm/px · 1 of 1 slices shown (1 of 2)]
[im 1/1]
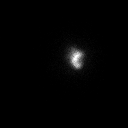

[Series 1: 0 min · 4.14mm/px · 1 of 1 slices shown (2 of 2)]
[im 1/1]
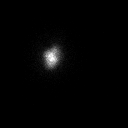

[Series 2: 60 min · 4.14mm/px · 1 of 1 slices shown (1 of 2)]
[im 1/1]
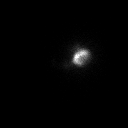

[Series 2: 60 min · 4.14mm/px · 1 of 1 slices shown (2 of 2)]
[im 1/1]
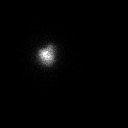

[Series 3: 120 min · 4.14mm/px · 1 of 1 slices shown (1 of 2)]
[im 1/1]
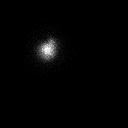

[Series 3: 120 min · 4.14mm/px · 1 of 1 slices shown (2 of 2)]
[im 1/1]
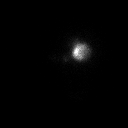

[Series 4: 180 min · 4.14mm/px · 1 of 1 slices shown (1 of 2)]
[im 1/1]
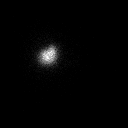

[Series 4: 180 min · 4.14mm/px · 1 of 1 slices shown (2 of 2)]
[im 1/1]
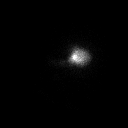

[Series 5: 240 min · 4.14mm/px · 1 of 1 slices shown (1 of 2)]
[im 1/1]
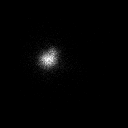

[Series 5: 240 min · 4.14mm/px · 1 of 1 slices shown (2 of 2)]
[im 1/1]
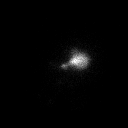

[10 of 10 positions shown; findings below may reference images not displayed]

FINDINGS: Expected location of the stomach in the left upper quadrant.

Essentially no emptying of tracer from the stomach over the course
of the study.

At the conclusion of the exam, only 9% emptying of tracer from the
stomach has occurred.

Findings represent markedly delayed gastric emptying.

Calculated emptying of 9% is unchanged since the prior study of
06/05/2016.
IMPRESSION: Markedly delayed gastric emptying, unchanged from previous study.

## 2019-08-31 DIAGNOSIS — E7849 Other hyperlipidemia: Secondary | ICD-10-CM | POA: Diagnosis not present

## 2019-08-31 DIAGNOSIS — I719 Aortic aneurysm of unspecified site, without rupture: Secondary | ICD-10-CM | POA: Diagnosis not present

## 2019-08-31 DIAGNOSIS — I10 Essential (primary) hypertension: Secondary | ICD-10-CM | POA: Diagnosis not present

## 2019-08-31 DIAGNOSIS — M1991 Primary osteoarthritis, unspecified site: Secondary | ICD-10-CM | POA: Diagnosis not present

## 2019-09-13 DIAGNOSIS — G4733 Obstructive sleep apnea (adult) (pediatric): Secondary | ICD-10-CM | POA: Diagnosis not present

## 2019-09-20 DIAGNOSIS — G4733 Obstructive sleep apnea (adult) (pediatric): Secondary | ICD-10-CM | POA: Diagnosis not present

## 2019-09-23 DIAGNOSIS — M545 Low back pain: Secondary | ICD-10-CM | POA: Diagnosis not present

## 2019-10-06 DIAGNOSIS — I10 Essential (primary) hypertension: Secondary | ICD-10-CM | POA: Diagnosis not present

## 2019-10-06 DIAGNOSIS — K219 Gastro-esophageal reflux disease without esophagitis: Secondary | ICD-10-CM | POA: Diagnosis not present

## 2019-10-06 DIAGNOSIS — H1132 Conjunctival hemorrhage, left eye: Secondary | ICD-10-CM | POA: Diagnosis not present

## 2019-10-06 DIAGNOSIS — G894 Chronic pain syndrome: Secondary | ICD-10-CM | POA: Diagnosis not present

## 2019-10-12 ENCOUNTER — Other Ambulatory Visit: Payer: Self-pay

## 2019-10-12 ENCOUNTER — Inpatient Hospital Stay (HOSPITAL_COMMUNITY): Payer: Medicare Other | Attending: Hematology

## 2019-10-12 DIAGNOSIS — Z85820 Personal history of malignant melanoma of skin: Secondary | ICD-10-CM | POA: Diagnosis not present

## 2019-10-12 DIAGNOSIS — Z87891 Personal history of nicotine dependence: Secondary | ICD-10-CM | POA: Insufficient documentation

## 2019-10-12 DIAGNOSIS — D696 Thrombocytopenia, unspecified: Secondary | ICD-10-CM | POA: Diagnosis not present

## 2019-10-12 DIAGNOSIS — K5900 Constipation, unspecified: Secondary | ICD-10-CM | POA: Insufficient documentation

## 2019-10-12 DIAGNOSIS — D72819 Decreased white blood cell count, unspecified: Secondary | ICD-10-CM | POA: Diagnosis not present

## 2019-10-12 DIAGNOSIS — Z808 Family history of malignant neoplasm of other organs or systems: Secondary | ICD-10-CM | POA: Insufficient documentation

## 2019-10-12 DIAGNOSIS — Z79899 Other long term (current) drug therapy: Secondary | ICD-10-CM | POA: Diagnosis not present

## 2019-10-12 DIAGNOSIS — Z8249 Family history of ischemic heart disease and other diseases of the circulatory system: Secondary | ICD-10-CM | POA: Diagnosis not present

## 2019-10-12 DIAGNOSIS — R5383 Other fatigue: Secondary | ICD-10-CM | POA: Diagnosis not present

## 2019-10-12 LAB — CBC WITH DIFFERENTIAL/PLATELET
Abs Immature Granulocytes: 0.02 10*3/uL (ref 0.00–0.07)
Basophils Absolute: 0 10*3/uL (ref 0.0–0.1)
Basophils Relative: 0 %
Eosinophils Absolute: 0 10*3/uL (ref 0.0–0.5)
Eosinophils Relative: 0 %
HCT: 42 % (ref 39.0–52.0)
Hemoglobin: 13.7 g/dL (ref 13.0–17.0)
Immature Granulocytes: 1 %
Lymphocytes Relative: 41 %
Lymphs Abs: 0.9 10*3/uL (ref 0.7–4.0)
MCH: 29.9 pg (ref 26.0–34.0)
MCHC: 32.6 g/dL (ref 30.0–36.0)
MCV: 91.7 fL (ref 80.0–100.0)
Monocytes Absolute: 0.2 10*3/uL (ref 0.1–1.0)
Monocytes Relative: 10 %
Neutro Abs: 1.1 10*3/uL — ABNORMAL LOW (ref 1.7–7.7)
Neutrophils Relative %: 48 %
Platelets: 64 10*3/uL — ABNORMAL LOW (ref 150–400)
RBC: 4.58 MIL/uL (ref 4.22–5.81)
RDW: 12.6 % (ref 11.5–15.5)
WBC: 2.3 10*3/uL — ABNORMAL LOW (ref 4.0–10.5)
nRBC: 0 % (ref 0.0–0.2)

## 2019-10-12 LAB — LACTATE DEHYDROGENASE: LDH: 150 U/L (ref 98–192)

## 2019-10-19 ENCOUNTER — Inpatient Hospital Stay (HOSPITAL_BASED_OUTPATIENT_CLINIC_OR_DEPARTMENT_OTHER): Payer: Medicare Other | Admitting: Hematology

## 2019-10-19 ENCOUNTER — Encounter (HOSPITAL_COMMUNITY): Payer: Self-pay | Admitting: Hematology

## 2019-10-19 ENCOUNTER — Other Ambulatory Visit: Payer: Self-pay

## 2019-10-19 VITALS — BP 156/89 | HR 79 | Temp 96.8°F | Resp 16 | Wt 244.5 lb

## 2019-10-19 DIAGNOSIS — D72819 Decreased white blood cell count, unspecified: Secondary | ICD-10-CM | POA: Diagnosis not present

## 2019-10-19 DIAGNOSIS — Z8249 Family history of ischemic heart disease and other diseases of the circulatory system: Secondary | ICD-10-CM | POA: Diagnosis not present

## 2019-10-19 DIAGNOSIS — K5900 Constipation, unspecified: Secondary | ICD-10-CM | POA: Diagnosis not present

## 2019-10-19 DIAGNOSIS — Z85820 Personal history of malignant melanoma of skin: Secondary | ICD-10-CM | POA: Diagnosis not present

## 2019-10-19 DIAGNOSIS — R5383 Other fatigue: Secondary | ICD-10-CM | POA: Diagnosis not present

## 2019-10-19 DIAGNOSIS — Z808 Family history of malignant neoplasm of other organs or systems: Secondary | ICD-10-CM | POA: Diagnosis not present

## 2019-10-19 DIAGNOSIS — D696 Thrombocytopenia, unspecified: Secondary | ICD-10-CM | POA: Diagnosis not present

## 2019-10-19 DIAGNOSIS — Z87891 Personal history of nicotine dependence: Secondary | ICD-10-CM | POA: Diagnosis not present

## 2019-10-19 DIAGNOSIS — Z79899 Other long term (current) drug therapy: Secondary | ICD-10-CM | POA: Diagnosis not present

## 2019-10-19 NOTE — Patient Instructions (Signed)
[place] at [facility] Discharge Instructions  You were seen today by Dr. Delton Coombes. He went over your recent results. Your labs may have been affected by your recent antibiotics. He will see you back in 3 months for lab recheck and follow up.   Thank you for choosing [place] at [facility] to provide your oncology and hematology care.  To afford each patient quality time with our provider, please arrive at least 15 minutes before your scheduled appointment time.   If you have a lab appointment with the [facility] please come in thru the  Main Entrance and check in at the main information desk  You need to re-schedule your appointment should you arrive 10 or more minutes late.  We strive to give you quality time with our providers, and arriving late affects you and other patients whose appointments are after yours.  Also, if you no show three or more times for appointments you may be dismissed from the clinic at the providers discretion.     Again, thank you for choosing [facility].  Our hope is that these requests will decrease the amount of time that you wait before being seen by our physicians.       _____________________________________________________________  Should you have questions after your visit to [facility], please contact our office at [phone] between the hours of 8:00 a.m. and 4:30 p.m.  Voicemails left after 4:00 p.m. will not be returned until the following business day.  For prescription refill requests, have your pharmacy contact our office and allow 72 hours.    Cancer Center Support Programs:   > Cancer Support Group  2nd Tuesday of the month 1pm-2pm, Journey Room

## 2019-10-19 NOTE — Progress Notes (Signed)
[name], [name] [id number]   CLINIC:  Medical Oncology/Hematology  PCP:  [facility], [place] / [place] [address]  [phone]  REASON FOR VISIT:  Follow-up for thrombocytopenia  CURRENT THERAPY: Observation.  INTERVAL HISTORY:  [name] 69 y.o. male returns for routine follow-up for his thrombocytopenia. [name] was last seen on 06/16/2019.  He was recently on antibiotics, 4 times a day for 7 days for a burst blood vessel in his eye. He bruises easy and points out several bruises on his arms.   REVIEW OF SYSTEMS:  Review of Systems  Constitutional: Positive for fatigue (moderate). Negative for appetite change, chills and fever.  HENT:   Negative for lump/mass, mouth sores, sore throat and trouble swallowing.   Eyes: Negative for eye problems.  Respiratory: Negative for chest tightness, cough, shortness of breath and wheezing.   Cardiovascular: Negative for chest pain and palpitations.  Gastrointestinal: Positive for constipation. Negative for abdominal pain, diarrhea, nausea and vomiting.  Genitourinary: Negative for bladder incontinence, dysuria, frequency and hematuria.   Musculoskeletal: Negative for arthralgias, back pain, flank pain and myalgias.  Skin: Negative for rash.  Neurological: Negative for dizziness, headaches, light-headedness and numbness.  Hematological: Bruises/bleeds easily.  Psychiatric/Behavioral: Negative for depression. The patient is not nervous/anxious.     PAST MEDICAL/SURGICAL HISTORY:  Past Medical History:  Diagnosis Date  . Anxiety    patient denies  . Aortic aneurysm (HCC)    ascending  . Arthritis   . Barrett esophagus   . Blood transfusion   . Cancer ([facility]) 2007   melanoma stage 3 stomach bx  . Coronary artery disease    mild by 09/08/13 cath [facility]  . GERD (gastroesophageal reflux disease)    barretts esophagus  . H/O hiatal hernia   . Headache(784.0)   .  Hypercholesteremia   . Hypertension   . PONV (postoperative nausea and vomiting)    trouble breathing after last [facility] ([facility])  . Shingles    10  . Sleep apnea    not tested  . Tumor liver    no problems   Past Surgical History:  Procedure Laterality Date  . ABDOMINAL SURGERY     x3  . back surgeries     x 9  . BACK SURGERY     x8  . BIOPSY N/A 03/28/2013   Procedure: Distal Esophageal Biopsy;  Surgeon: Rogene Houston, MD;  Location: [facility];  Service: Endoscopy;  Laterality: N/A;  . BIOPSY  05/30/2016   Procedure: BIOPSY;  Surgeon: Rogene Houston, MD;  Location: [facility];  Service: Endoscopy;;  . BIOPSY  02/19/2018   Procedure: BIOPSY;  Surgeon: Rogene Houston, MD;  [facility];  Service: Endoscopy;;  Barret's esophagus  . CHEST SURGERY     lung incisional hernia for hernia  . COLONOSCOPY WITH PROPOFOL N/A 02/19/2018   Procedure: COLONOSCOPY WITH PROPOFOL;  Surgeon: Rogene Houston, MD;  Location: [facility];  Service: Endoscopy;  Laterality: N/A;  1:00  . ESOPHAGOGASTRODUODENOSCOPY (EGD) WITH PROPOFOL N/A 03/28/2013   Procedure: ESOPHAGOGASTRODUODENOSCOPY (EGD) WITH PROPOFOL;  Surgeon: Rogene Houston, MD;  Location: [facility];  Service: Endoscopy;  Laterality: N/A;  GE junction @ 38, proximal  margin @37   . ESOPHAGOGASTRODUODENOSCOPY (EGD) WITH PROPOFOL N/A 05/30/2016   Procedure: ESOPHAGOGASTRODUODENOSCOPY (EGD) WITH PROPOFOL;  Surgeon: Rogene Houston, MD;  [facility];  Service: Endoscopy;  Laterality: N/A;  955  . ESOPHAGOGASTRODUODENOSCOPY (EGD) WITH PROPOFOL N/A 02/19/2018   Procedure: ESOPHAGOGASTRODUODENOSCOPY (EGD) WITH PROPOFOL;  Surgeon: Rogene Houston, MD;  [facility];  Service: Endoscopy;  Laterality: N/A;  . HEMORRHOID SURGERY     x2  . HERNIA REPAIR    . HIATAL HERNIA REPAIR    . JOINT REPLACEMENT     lft knee  . KNEE ARTHROPLASTY     lft  . KNEE ARTHROSCOPY Right   . LEG SURGERY     x4 crushed   .  LITHOTRIPSY    . MELANOMA EXCISION     lft abdomen  . NECK SURGERY  2012  . POLYPECTOMY  02/19/2018   Procedure: POLYPECTOMY;  Surgeon: Rogene Houston, MD;  [facility];  Service: Endoscopy;;  proximal transverse colon (CB x1), descending colon (CBx3)  . POSTERIOR CERVICAL FUSION/FORAMINOTOMY  08/18/2011   Procedure: POSTERIOR CERVICAL FUSION/FORAMINOTOMY LEVEL 3;  Surgeon: Ophelia Charter, MD;  Location: [facility];  Service: Neurosurgery;  Laterality: N/A;  Cervical three-four, four-five, five-six posterior cervical  fusion with instrumentation; Right cervical three-four, four-five laminotomy  . REVISION TOTAL HIP ARTHROPLASTY Left   . SHOULDER ARTHROSCOPY     rt bith defect , spur, rotator cuff  . SPINAL CORD STIMULATOR IMPLANT     rt hip  . SPINAL CORD STIMULATOR INSERTION N/A 11/03/2016   Procedure: LUMBAR SPINAL CORD STIMULATOR REVISION;  Surgeon: Newman Pies, MD;  Location: [facility];  Service: Neurosurgery;  Laterality: N/A;  . THYROID LOBECTOMY     rt    SOCIAL HISTORY:  Social History   Socioeconomic History  . Marital status: Married    Spouse name: Not on file  . Number of children: Not on file  . Years of education: Not on file  . Highest education level: Not on file  Occupational History  . Occupation: retired  Tobacco Use  . Smoking status: Former Smoker    Packs/day: 4.00    Years: 10.00    Pack years: 40.00    Types: Cigarettes    Quit date: 08/12/1980    Years since quitting: 39.2  . Smokeless tobacco: Never Used  . Tobacco comment: occ wine  Substance and Sexual Activity  . Alcohol use: Yes    Alcohol/week: 0.0 standard drinks    Comment: rarely  . Drug use: No  . Sexual activity: Yes    Birth control/protection: None  Other Topics Concern  . Not on file  Social History Narrative  . Not on file   Social Determinants of Health   Financial Resource Strain:   . Difficulty of Paying Living Expenses:   Food Insecurity:   . Worried  About Charity fundraiser in the Last Year:   . Arboriculturist in the Last Year:   Transportation Needs:   . Film/video editor (Medical):   Marland Kitchen Lack of Transportation (Non-Medical):   Physical Activity:   . Days of Exercise per Week:   . Minutes of Exercise per Session:   Stress:   . Feeling of Stress :   Social Connections:   . Frequency of Communication with Friends and Family:   . Frequency of Social Gatherings with Friends and Family:   . Attends Religious Services:   . Active Member of Clubs or Organizations:   . Attends Archivist Meetings:   Marland Kitchen Marital Status:   Intimate Partner Violence:   . Fear of Current or Ex-Partner:   .  Emotionally Abused:   Marland Kitchen Physically Abused:   . Sexually Abused:     FAMILY HISTORY:  Family History  Problem Relation Age of Onset  . Heart disease Father   . Aneurysm Mother   . Aneurysm Sister   . Heart disease Paternal Grandmother   . Heart disease Paternal Grandfather   . Heart disease Maternal Grandfather   . Heart disease Brother        heart transplant  . Aneurysm Other        pt states several members have aortic aneurysm  . Skin cancer Other        several members  . Lymphoma Brother   . Aneurysm Maternal Grandmother     CURRENT MEDICATIONS:  Current Outpatient Medications  Medication Sig Dispense Refill  . aspirin EC 81 MG tablet Take 81 mg by mouth. Patient states that he takes 3 times a week    . Aspirin-Acetaminophen-Caffeine (GOODY HEADACHE PO) Take 1-2 packets by mouth daily as needed (headaches).    Marland Kitchen Bioflavonoid Products (ESTER C PO) Take 1 tablet by mouth daily.    . calcium-vitamin D (OSCAL WITH D) 500-200 MG-UNIT per tablet Take 1 tablet by mouth 2 (two) times daily.     . clopidogrel (PLAVIX) 75 MG tablet TAKE 1 TABLET BY MOUTH ONCE DAILY START DATE 9 10 2020    . cyclobenzaprine (FLEXERIL) 10 MG tablet Take 1 tablet (10 mg total) by mouth 3 (three) times daily as needed for muscle spasms. 50 tablet 1    . dexlansoprazole (DEXILANT) 60 MG capsule Take 60 mg by mouth daily.     . diazepam (VALIUM) 10 MG tablet Take 10 mg by mouth 4 (four) times daily as needed (for back pain).     . fexofenadine (ALLEGRA) 180 MG tablet Take 180 mg by mouth every evening.    . fish oil-omega-3 fatty acids 1000 MG capsule Take 1 g by mouth 2 (two) times daily.    Marland Kitchen lactulose, encephalopathy, (CHRONULAC) 10 GM/15ML SOLN Take 45 mLs (30 g total) by mouth at bedtime. 946 mL 0  . lisinopril (PRINIVIL,ZESTRIL) 10 MG tablet Take 10 mg by mouth daily.    . metoCLOPramide (REGLAN) 10 MG tablet TAKE ONE TABLET BY MOUTH TWICE A DAY AS NEEDED FOR NAUSEA 60 tablet 2  . montelukast (SINGULAIR) 10 MG tablet Take 10 mg by mouth daily.     . Multiple Vitamin (MULITIVITAMIN WITH MINERALS) TABS Take 1 tablet by mouth daily.    Marland Kitchen neomycin-polymyxin b-dexamethasone (MAXITROL) 3.5-10000-0.1 SUSP INSTILL THREE DROPS IN LEFT EYE FOUR TIMES A DAY FOR 5 DAYS    . ondansetron (ZOFRAN) 4 MG tablet Take 1 tablet (4 mg total) by mouth every 8 (eight) hours as needed for nausea or vomiting. 6 tablet 0  . oxyCODONE-acetaminophen (PERCOCET) 10-325 MG tablet Take 1 tablet by mouth every 6 (six) hours as needed for pain.    . rosuvastatin (CRESTOR) 10 MG tablet Take 10 mg by mouth every evening.      No current facility-administered medications for this visit.    ALLERGIES:  Allergies  Allergen Reactions  . Demerol Other (See Comments)    "Makes me crazy."  . Contrast Media [Iodinated Diagnostic Agents] Nausea Only    PHYSICAL EXAM:  Performance status (ECOG): 1 - Symptomatic but completely ambulatory  Vitals:   10/19/19 1100  BP: (!) 156/89  Pulse: 79  Resp: 16  Temp: (!) 96.8 F (36 C)  SpO2: 97%  Wt Readings from Last 3 Encounters:  10/19/19 244 lb 8 oz (110.9 kg)  06/16/19 243 lb (110.2 kg)  06/09/19 245 lb (111.1 kg)   Physical Exam Constitutional:      Appearance: Normal appearance.  HENT:     Nose: No congestion.      Mouth/Throat:     Mouth: Mucous membranes are moist.  Eyes:     Extraocular Movements: Extraocular movements intact.     Pupils: Pupils are equal, round, and reactive to light.  Cardiovascular:     Rate and Rhythm: Normal rate and regular rhythm.     Heart sounds: No murmur. No gallop.   Pulmonary:     Breath sounds: No wheezing, rhonchi or rales.  Abdominal:     Tenderness: There is no abdominal tenderness.  Musculoskeletal:        General: No tenderness.     Cervical back: Normal range of motion. No tenderness.     Right lower leg: No edema.     Left lower leg: No edema.  Skin:    General: Skin is warm and dry.     Findings: No bruising, erythema or rash.  Neurological:     Mental Status: He is alert and oriented to person, place, and time.     Sensory: No sensory deficit.     Motor: No weakness.  Psychiatric:        Mood and Affect: Mood normal.        Behavior: Behavior normal.        Thought Content: Thought content normal.        Judgment: Judgment normal.     LABORATORY DATA:  I have reviewed the labs as listed.  CBC Latest Ref Rng & Units 10/12/2019 06/09/2019 03/01/2019  WBC 4.0 - 10.5 K/uL 2.3(L) 2.7(L) 3.1(L)  Hemoglobin 13.0 - 17.0 g/dL 13.7 14.1 13.5  Hematocrit 39.0 - 52.0 % 42.0 42.8 41.1  Platelets 150 - 400 K/uL 64(L) 73(L) 81(L)   CMP Latest Ref Rng & Units 06/09/2019 03/01/2019 10/29/2018  Glucose 70 - 99 mg/dL 96 96 95  BUN 8 - 23 mg/dL 18 21 29(H)  Creatinine 0.61 - 1.24 mg/dL 1.18 1.11 1.22  Sodium 135 - 145 mmol/L 140 139 139  Potassium 3.5 - 5.1 mmol/L 4.0 4.2 3.9  Chloride 98 - 111 mmol/L 105 108 105  CO2 22 - 32 mmol/L 26 23 21(L)  Calcium 8.9 - 10.3 mg/dL 9.1 9.3 9.5  Total Protein 6.5 - 8.1 g/dL 7.1 - 7.9  Total Bilirubin 0.3 - 1.2 mg/dL 0.9 - 1.0  Alkaline Phos 38 - 126 U/L 54 - 79  AST 15 - 41 U/L 25 - 30  ALT 0 - 44 U/L 21 - 28       Component Value Date/Time   RBC 4.58 10/12/2019 1046   MCV 91.7 10/12/2019 1046   MCH 29.9  10/12/2019 1046   MCHC 32.6 10/12/2019 1046   RDW 12.6 10/12/2019 1046   LYMPHSABS 0.9 10/12/2019 1046   MONOABS 0.2 10/12/2019 1046   EOSABS 0.0 10/12/2019 1046   BASOSABS 0.0 10/12/2019 1046    DIAGNOSTIC IMAGING:  I have independently reviewed the scans and discussed with the patient.  ASSESSMENT & PLAN:  Thrombocytopenia ([facility]) Assessment: -Seen as referral from Dr. Nolon Rod office for decreased platelet count of 80 on CBC on 10/21/2018. -Ex-smoker, quit in 1982, smoked 1 to 2 packs/day for 10 years.  He also spent 2 years at [facility]. -CT abdomen on  11/08/2018 showed normal size spleen with no abdominal or pelvic pathology. -BMBX on 03/01/2019 showed slightly hypercellular marrow for age with trilineage hematopoiesis including abundant megakaryocytes and predominantly normal morphology.  Overall myeloid changes are nonspecific and not diagnostic for MDS.  Chromosome analysis was normal.  MDS FISH panel was normal.  Core biopsy showed small lymphoid aggregates, features are not considered specific or diagnostic of lymphoproliferative disorder.  Plan: 1 moderate thrombocytopenia: -Denies any fevers, night sweats or weight loss. -Reportedly was taking antibiotic 4 times a day for 7 days for right eye blood vessel rupture.  Reports some easy bruising. -We reviewed CBC from 10/12/2019.  Platelet count is 64, previously 73.  LDH was normal at 150.  Hemoglobin was normal. -Slight worsening of thrombocytopenia most likely from recent antibiotic use. -We will plan for follow-up in 3 months with repeat labs.  If it continues to be low, will consider NGS panel. 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  2.  Mild leukopenia: -His white count was 2.3.  Absolute neutrophil count is 1100.  Autoimmune leukopenia is a possibility.  No abnormalities on the bone marrow biopsy on the granulocytic series.  3.  Malignant melanoma: -Melanoma resected on the left side of the abdomen with  left axillary sentinel lymph node biopsy at [facility] 14 years ago.  Reportedly stage III. -No evidence of lymphadenopathy today.    Orders placed this encounter:  Orders Placed This Encounter  Procedures  . CBC with Differential/Platelet  . Comprehensive metabolic panel  . Lactate dehydrogenase         Derek Jack, MD, 10/21/19 4:42 PM  [place] [phone]   I, Amber, am acting as a scribe for Dr. Sanda Linger.  I, Derek Jack MD, have reviewed the above documentation for accuracy and completeness, and I agree with the above.

## 2019-10-20 DIAGNOSIS — G4733 Obstructive sleep apnea (adult) (pediatric): Secondary | ICD-10-CM | POA: Diagnosis not present

## 2019-10-21 NOTE — Assessment & Plan Note (Addendum)
Assessment: -Seen as referral from Dr. Nolon Rod office for decreased platelet count of 80 on CBC on 10/21/2018. -Ex-smoker, quit in 1982, smoked 1 to 2 packs/day for 10 years.  He also spent 2 years at [facility]. -CT abdomen on 11/08/2018 showed normal size spleen with no abdominal or pelvic pathology. -BMBX on 03/01/2019 showed slightly hypercellular marrow for age with trilineage hematopoiesis including abundant megakaryocytes and predominantly normal morphology.  Overall myeloid changes are nonspecific and not diagnostic for MDS.  Chromosome analysis was normal.  MDS FISH panel was normal.  Core biopsy showed small lymphoid aggregates, features are not considered specific or diagnostic of lymphoproliferative disorder.  Plan: 1 moderate thrombocytopenia: -Denies any fevers, night sweats or weight loss. -Reportedly was taking antibiotic 4 times a day for 7 days for right eye blood vessel rupture.  Reports some easy bruising. -We reviewed CBC from 10/12/2019.  Platelet count is 64, previously 73.  LDH was normal at 150.  Hemoglobin was normal. -Slight worsening of thrombocytopenia most likely from recent antibiotic use. -We will plan for follow-up in 3 months with repeat labs.  If it continues to be low, will consider NGS panel. 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  2.  Mild leukopenia: -His white count was 2.3.  Absolute neutrophil count is 1100.  Autoimmune leukopenia is a possibility.  No abnormalities on the bone marrow biopsy on the granulocytic series.  3.  Malignant melanoma: -Melanoma resected on the left side of the abdomen with left axillary sentinel lymph node biopsy at [facility] 14 years ago.  Reportedly stage III. -No evidence of lymphadenopathy today.

## 2019-11-20 DIAGNOSIS — G4733 Obstructive sleep apnea (adult) (pediatric): Secondary | ICD-10-CM | POA: Diagnosis not present

## 2019-11-22 ENCOUNTER — Other Ambulatory Visit: Payer: Self-pay

## 2019-11-22 ENCOUNTER — Encounter (INDEPENDENT_AMBULATORY_CARE_PROVIDER_SITE_OTHER): Payer: Self-pay | Admitting: Internal Medicine

## 2019-11-22 ENCOUNTER — Ambulatory Visit (INDEPENDENT_AMBULATORY_CARE_PROVIDER_SITE_OTHER): Payer: Medicare Other | Admitting: Internal Medicine

## 2019-11-22 VITALS — BP 120/81 | HR 61 | Temp 98.0°F | Resp 20 | Ht 72.0 in | Wt 243.1 lb

## 2019-11-22 DIAGNOSIS — K3184 Gastroparesis: Secondary | ICD-10-CM | POA: Diagnosis not present

## 2019-11-22 DIAGNOSIS — K219 Gastro-esophageal reflux disease without esophagitis: Secondary | ICD-10-CM | POA: Diagnosis not present

## 2019-11-22 DIAGNOSIS — K5900 Constipation, unspecified: Secondary | ICD-10-CM | POA: Diagnosis not present

## 2019-11-22 MED ORDER — MOTEGRITY 2 MG PO TABS: 2.0000 mg | ORAL_TABLET | Freq: Every day | ORAL | 5 refills | Status: DC

## 2019-11-22 NOTE — Progress Notes (Signed)
Presenting complaint;  Follow-up for chronic GERD and gastroparesis. History of constipation.  Patient says medication is not working.  Database and subjective:  Patient is 69 year old Caucasian male who has chronic GERD complicated by short segment Barrett's esophagus  failed multiple antireflux surgeries gastroparesis as well as history of constipation who is here for scheduled visit.  He was last seen about 6 months ago.  On his last visit he was noted to have ventral hernia.  He was advised to follow-up with his surgeon.  He saw Dr. Ninfa Linden of [facility] surgery.  He recommended observation as he felt surgery would be high risk.  Patient states he is doing well as per his heartburn is concerned.  He may have 1 or 2 episodes in a month.  He denies dysphagia.  However he is having difficulty with his bowels.  MiraLAX is not working anymore.  He is having to take milk of magnesia at least once a week.  He denies abdominal pain.  Appetite is good.  He denies melena or rectal bleeding. He is not having any side effects with metoclopramide. Patient says he takes [place] powder rarely.  He states he is only taken 1 dose and 90 days.  Last Zofran dose was 2 weeks ago.  He does not take oxycodone often.  Last dose was 1 month ago.  Similarly he does not take Valium often. He is followed by Dr. Delton Coombes for thrombocytopenia which is felt to be immune mediated.  He has undergone extensive work-up which has been negative for lymphoproliferative disorder.  He has received both doses of Covid vaccine manufactured by [employer].  Current Medications: Outpatient Encounter Medications as of 11/22/2019  Medication Sig  . aspirin EC 81 MG tablet Take 81 mg by mouth. Patient states that he takes 3 times a week  . Aspirin-Acetaminophen-Caffeine (GOODY HEADACHE PO) Take 1-2 packets by mouth daily as needed (headaches).  . calcium-vitamin D (OSCAL WITH D) 500-200 MG-UNIT per tablet Take 1 tablet by mouth 2 (two)  times daily.   . clopidogrel (PLAVIX) 75 MG tablet TAKE 1 TABLET BY MOUTH ONCE DAILY START DATE 9 10 2020  . cyclobenzaprine (FLEXERIL) 10 MG tablet Take 1 tablet (10 mg total) by mouth 3 (three) times daily as needed for muscle spasms.  Marland Kitchen dexlansoprazole (DEXILANT) 60 MG capsule Take 60 mg by mouth daily.   . diazepam (VALIUM) 10 MG tablet Take 10 mg by mouth 4 (four) times daily as needed (for back pain).   . fexofenadine (ALLEGRA) 180 MG tablet Take 180 mg by mouth every evening.  . fish oil-omega-3 fatty acids 1000 MG capsule Take 1 g by mouth 2 (two) times daily.  Marland Kitchen lisinopril (PRINIVIL,ZESTRIL) 10 MG tablet Take 10 mg by mouth daily.  . metoCLOPramide (REGLAN) 10 MG tablet TAKE ONE TABLET BY MOUTH TWICE A DAY AS NEEDED FOR NAUSEA  . montelukast (SINGULAIR) 10 MG tablet Take 10 mg by mouth daily.   . Multiple Vitamin (MULITIVITAMIN WITH MINERALS) TABS Take 1 tablet by mouth daily.  . ondansetron (ZOFRAN) 4 MG tablet Take 1 tablet (4 mg total) by mouth every 8 (eight) hours as needed for nausea or vomiting.  Marland Kitchen oxyCODONE-acetaminophen (PERCOCET) 10-325 MG tablet Take 1 tablet by mouth every 6 (six) hours as needed for pain.  . rosuvastatin (CRESTOR) 10 MG tablet Take 10 mg by mouth every evening.   Marland Kitchen Bioflavonoid Products (ESTER C PO) Take 1 tablet by mouth daily. (Patient not taking: Reported on 11/22/2019)  .  lactulose, encephalopathy, (CHRONULAC) 10 GM/15ML SOLN Take 45 mLs (30 g total) by mouth at bedtime. (Patient not taking: Reported on 11/22/2019)  . neomycin-polymyxin b-dexamethasone (MAXITROL) 3.5-10000-0.1 SUSP INSTILL THREE DROPS IN LEFT EYE FOUR TIMES A DAY FOR 5 DAYS (Patient not taking: Reported on 11/22/2019)   No facility-administered encounter medications on file as of 11/22/2019.     Objective: Blood pressure 120/81, pulse 61, temperature 98 F (36.7 C), temperature source Oral, resp. rate 20, height 6' (1.829 m), weight 243 lb 1.6 oz (110.3 kg). Patient is alert and in no  acute distress. He is wearing a mask. Conjunctiva is pink. Sclera is nonicteric Oropharyngeal mucosa is normal. No neck masses or thyromegaly noted. Cardiac exam with regular rhythm normal S1 and S2. No murmur or gallop noted. Lungs are clear to auscultation. Abdomen is full.  He has upper midline scar.  On palpation abdomen is soft and nontender with no organomegaly or masses.  There is fullness in left midabdomen but no cough impulse noted. No LE edema or clubbing noted. He does not have orofacial or extremity tremors.  Assessment:  #1.  Chronic GERD.  He is doing well with antireflux measures and PPI.  He has history of Barrett's esophagus.  Last EGD in September 2019 was negative for Barrett's esophagus.  #2.  Gastroparesis most likely related to prior antireflux surgeries and vagal injury.  He is tolerating low-dose metoclopramide.  #3.  Chronic constipation.  MiraLAX is not working anymore.  Will try him on prucalopride which may also help with gastric motility.  It remains to be seen if it is going to be covered.  #4.  History of colonic adenomas.  Next colonoscopy in September 2024.   Plan:  Discontinue polyethylene glycol. Prucalopride 1 mg by mouth daily with breakfast. If this medication is not covered next step would be Linzess/linaclotide 145 mcg p.o. daily. Office visit in 6 months.

## 2019-11-22 NOTE — Patient Instructions (Signed)
If [employer] is not covered will use Linzess 145 mcg daily.

## 2019-11-30 DIAGNOSIS — I10 Essential (primary) hypertension: Secondary | ICD-10-CM | POA: Diagnosis not present

## 2019-11-30 DIAGNOSIS — E7849 Other hyperlipidemia: Secondary | ICD-10-CM | POA: Diagnosis not present

## 2019-12-06 ENCOUNTER — Telehealth (INDEPENDENT_AMBULATORY_CARE_PROVIDER_SITE_OTHER): Payer: Self-pay | Admitting: Internal Medicine

## 2019-12-06 NOTE — Telephone Encounter (Signed)
Dr.Rehman states that he will have to think on this.

## 2019-12-06 NOTE — Telephone Encounter (Signed)
Patient left voice mail message stating the medication prescribed is too expensive and would like to try something else - please advise - ph# [phone]

## 2019-12-09 NOTE — Telephone Encounter (Signed)
If patient calls back please let  him know that Dr.Rehman is reviewing any possible options. Office will contact him with his recommendation  if any.  Thank You

## 2019-12-20 DIAGNOSIS — G4733 Obstructive sleep apnea (adult) (pediatric): Secondary | ICD-10-CM | POA: Diagnosis not present

## 2019-12-29 ENCOUNTER — Other Ambulatory Visit (INDEPENDENT_AMBULATORY_CARE_PROVIDER_SITE_OTHER): Payer: Self-pay | Admitting: Internal Medicine

## 2020-01-10 DIAGNOSIS — I2510 Atherosclerotic heart disease of native coronary artery without angina pectoris: Secondary | ICD-10-CM | POA: Diagnosis not present

## 2020-01-10 DIAGNOSIS — I10 Essential (primary) hypertension: Secondary | ICD-10-CM | POA: Diagnosis not present

## 2020-01-10 DIAGNOSIS — Z955 Presence of coronary angioplasty implant and graft: Secondary | ICD-10-CM | POA: Diagnosis not present

## 2020-01-10 DIAGNOSIS — I712 Thoracic aortic aneurysm, without rupture: Secondary | ICD-10-CM | POA: Diagnosis not present

## 2020-01-10 DIAGNOSIS — R0602 Shortness of breath: Secondary | ICD-10-CM | POA: Diagnosis not present

## 2020-01-11 ENCOUNTER — Other Ambulatory Visit (HOSPITAL_COMMUNITY): Payer: Medicare Other

## 2020-01-18 ENCOUNTER — Other Ambulatory Visit: Payer: Self-pay

## 2020-01-18 ENCOUNTER — Inpatient Hospital Stay (HOSPITAL_COMMUNITY): Payer: Medicare Other | Attending: Hematology

## 2020-01-18 DIAGNOSIS — C4359 Malignant melanoma of other part of trunk: Secondary | ICD-10-CM | POA: Diagnosis not present

## 2020-01-18 DIAGNOSIS — K5900 Constipation, unspecified: Secondary | ICD-10-CM | POA: Diagnosis not present

## 2020-01-18 DIAGNOSIS — K219 Gastro-esophageal reflux disease without esophagitis: Secondary | ICD-10-CM | POA: Insufficient documentation

## 2020-01-18 DIAGNOSIS — M549 Dorsalgia, unspecified: Secondary | ICD-10-CM | POA: Insufficient documentation

## 2020-01-18 DIAGNOSIS — I714 Abdominal aortic aneurysm, without rupture: Secondary | ICD-10-CM | POA: Insufficient documentation

## 2020-01-18 DIAGNOSIS — D72819 Decreased white blood cell count, unspecified: Secondary | ICD-10-CM | POA: Insufficient documentation

## 2020-01-18 DIAGNOSIS — Z8249 Family history of ischemic heart disease and other diseases of the circulatory system: Secondary | ICD-10-CM | POA: Diagnosis not present

## 2020-01-18 DIAGNOSIS — Z79899 Other long term (current) drug therapy: Secondary | ICD-10-CM | POA: Insufficient documentation

## 2020-01-18 DIAGNOSIS — R5383 Other fatigue: Secondary | ICD-10-CM | POA: Diagnosis not present

## 2020-01-18 DIAGNOSIS — D696 Thrombocytopenia, unspecified: Secondary | ICD-10-CM | POA: Insufficient documentation

## 2020-01-18 DIAGNOSIS — Z808 Family history of malignant neoplasm of other organs or systems: Secondary | ICD-10-CM | POA: Diagnosis not present

## 2020-01-18 DIAGNOSIS — R0602 Shortness of breath: Secondary | ICD-10-CM | POA: Diagnosis not present

## 2020-01-18 DIAGNOSIS — R110 Nausea: Secondary | ICD-10-CM | POA: Insufficient documentation

## 2020-01-18 DIAGNOSIS — Z87891 Personal history of nicotine dependence: Secondary | ICD-10-CM | POA: Insufficient documentation

## 2020-01-18 LAB — COMPREHENSIVE METABOLIC PANEL
ALT: 22 U/L (ref 0–44)
AST: 27 U/L (ref 15–41)
Albumin: 4.4 g/dL (ref 3.5–5.0)
Alkaline Phosphatase: 59 U/L (ref 38–126)
Anion gap: 8 (ref 5–15)
BUN: 16 mg/dL (ref 8–23)
CO2: 23 mmol/L (ref 22–32)
Calcium: 9.1 mg/dL (ref 8.9–10.3)
Chloride: 106 mmol/L (ref 98–111)
Creatinine, Ser: 1.31 mg/dL — ABNORMAL HIGH (ref 0.61–1.24)
GFR calc Af Amer: >60 mL/min (ref >60)
GFR calc non Af Amer: 56 mL/min — ABNORMAL LOW (ref >60)
Glucose, Bld: 115 mg/dL — ABNORMAL HIGH (ref 70–99)
Potassium: 4.3 mmol/L (ref 3.5–5.1)
Sodium: 137 mmol/L (ref 135–145)
Total Bilirubin: 0.8 mg/dL (ref 0.3–1.2)
Total Protein: 7.1 g/dL (ref 6.5–8.1)

## 2020-01-18 LAB — CBC WITH DIFFERENTIAL/PLATELET
Abs Immature Granulocytes: 0.02 10*3/uL (ref 0.00–0.07)
Basophils Absolute: 0 10*3/uL (ref 0.0–0.1)
Basophils Relative: 0 %
Eosinophils Absolute: 0.1 10*3/uL (ref 0.0–0.5)
Eosinophils Relative: 2 %
HCT: 40.3 % (ref 39.0–52.0)
Hemoglobin: 13.1 g/dL (ref 13.0–17.0)
Immature Granulocytes: 1 %
Lymphocytes Relative: 37 %
Lymphs Abs: 0.9 10*3/uL (ref 0.7–4.0)
MCH: 29.4 pg (ref 26.0–34.0)
MCHC: 32.5 g/dL (ref 30.0–36.0)
MCV: 90.6 fL (ref 80.0–100.0)
Monocytes Absolute: 0.2 10*3/uL (ref 0.1–1.0)
Monocytes Relative: 9 %
Neutro Abs: 1.3 10*3/uL — ABNORMAL LOW (ref 1.7–7.7)
Neutrophils Relative %: 51 %
Platelets: 56 10*3/uL — ABNORMAL LOW (ref 150–400)
RBC: 4.45 MIL/uL (ref 4.22–5.81)
RDW: 13.5 % (ref 11.5–15.5)
WBC: 2.6 10*3/uL — ABNORMAL LOW (ref 4.0–10.5)
nRBC: 0 % (ref 0.0–0.2)

## 2020-01-18 LAB — LACTATE DEHYDROGENASE: LDH: 162 U/L (ref 98–192)

## 2020-01-20 DIAGNOSIS — G4730 Sleep apnea, unspecified: Secondary | ICD-10-CM | POA: Diagnosis not present

## 2020-01-20 DIAGNOSIS — E7849 Other hyperlipidemia: Secondary | ICD-10-CM | POA: Diagnosis not present

## 2020-01-20 DIAGNOSIS — I10 Essential (primary) hypertension: Secondary | ICD-10-CM | POA: Diagnosis not present

## 2020-01-20 DIAGNOSIS — Z1389 Encounter for screening for other disorder: Secondary | ICD-10-CM | POA: Diagnosis not present

## 2020-01-20 DIAGNOSIS — Z0001 Encounter for general adult medical examination with abnormal findings: Secondary | ICD-10-CM | POA: Diagnosis not present

## 2020-01-20 DIAGNOSIS — G894 Chronic pain syndrome: Secondary | ICD-10-CM | POA: Diagnosis not present

## 2020-01-20 DIAGNOSIS — E039 Hypothyroidism, unspecified: Secondary | ICD-10-CM | POA: Diagnosis not present

## 2020-01-20 DIAGNOSIS — G4733 Obstructive sleep apnea (adult) (pediatric): Secondary | ICD-10-CM | POA: Diagnosis not present

## 2020-01-25 ENCOUNTER — Other Ambulatory Visit: Payer: Self-pay

## 2020-01-25 ENCOUNTER — Inpatient Hospital Stay (HOSPITAL_BASED_OUTPATIENT_CLINIC_OR_DEPARTMENT_OTHER): Payer: Medicare Other | Admitting: Hematology

## 2020-01-25 ENCOUNTER — Encounter (HOSPITAL_COMMUNITY): Payer: Self-pay | Admitting: Hematology

## 2020-01-25 VITALS — BP 114/85 | HR 70 | Temp 96.9°F | Resp 18 | Wt 237.3 lb

## 2020-01-25 DIAGNOSIS — K219 Gastro-esophageal reflux disease without esophagitis: Secondary | ICD-10-CM | POA: Diagnosis not present

## 2020-01-25 DIAGNOSIS — Z87891 Personal history of nicotine dependence: Secondary | ICD-10-CM | POA: Diagnosis not present

## 2020-01-25 DIAGNOSIS — C4359 Malignant melanoma of other part of trunk: Secondary | ICD-10-CM | POA: Diagnosis not present

## 2020-01-25 DIAGNOSIS — D696 Thrombocytopenia, unspecified: Secondary | ICD-10-CM

## 2020-01-25 DIAGNOSIS — D72819 Decreased white blood cell count, unspecified: Secondary | ICD-10-CM | POA: Diagnosis not present

## 2020-01-25 DIAGNOSIS — Z8249 Family history of ischemic heart disease and other diseases of the circulatory system: Secondary | ICD-10-CM | POA: Diagnosis not present

## 2020-01-25 DIAGNOSIS — R110 Nausea: Secondary | ICD-10-CM | POA: Diagnosis not present

## 2020-01-25 DIAGNOSIS — R5383 Other fatigue: Secondary | ICD-10-CM | POA: Diagnosis not present

## 2020-01-25 DIAGNOSIS — Z79899 Other long term (current) drug therapy: Secondary | ICD-10-CM | POA: Diagnosis not present

## 2020-01-25 DIAGNOSIS — R0602 Shortness of breath: Secondary | ICD-10-CM | POA: Diagnosis not present

## 2020-01-25 DIAGNOSIS — I714 Abdominal aortic aneurysm, without rupture: Secondary | ICD-10-CM | POA: Diagnosis not present

## 2020-01-25 DIAGNOSIS — M549 Dorsalgia, unspecified: Secondary | ICD-10-CM | POA: Diagnosis not present

## 2020-01-25 DIAGNOSIS — K5900 Constipation, unspecified: Secondary | ICD-10-CM | POA: Diagnosis not present

## 2020-01-25 DIAGNOSIS — Z808 Family history of malignant neoplasm of other organs or systems: Secondary | ICD-10-CM | POA: Diagnosis not present

## 2020-01-25 MED ORDER — DEXAMETHASONE 4 MG PO TABS: 40.0000 mg | ORAL_TABLET | Freq: Every day | ORAL | 0 refills | Status: AC

## 2020-01-25 NOTE — Progress Notes (Signed)
Orders received for dexamethasone 4 mg (40 mg daily - 10 pills) for 4 days.  Prescription sent to pharmacy and patient is aware.

## 2020-01-25 NOTE — Patient Instructions (Signed)
[place] at [facility] Discharge Instructions  You were seen today by Dr. Delton Coombes. He went over your recent results. You will be prescribed prednisone 4 mg to take 10 tablets for 4 days with breakfast; start taking them on Friday after your CT scan. Drink plenty of water before your CT scan. You will be scheduled for blood work on 9/6 and Dr. Delton Coombes will call you for follow up.   Thank you for choosing [place] at [facility] to provide your oncology and hematology care.  To afford each patient quality time with our provider, please arrive at least 15 minutes before your scheduled appointment time.   If you have a lab appointment with the [place] please come in thru the Main Entrance and check in at the main information desk  You need to re-schedule your appointment should you arrive 10 or more minutes late.  We strive to give you quality time with our providers, and arriving late affects you and other patients whose appointments are after yours.  Also, if you no show three or more times for appointments you may be dismissed from the clinic at the providers discretion.     Again, thank you for choosing [facility].  Our hope is that these requests will decrease the amount of time that you wait before being seen by our physicians.       _____________________________________________________________  Should you have questions after your visit to [facility], please contact our office at [phone] between the hours of 8:00 a.m. and 4:30 p.m.  Voicemails left after 4:00 p.m. will not be returned until the following business day.  For prescription refill requests, have your pharmacy contact our office and allow 72 hours.    Cancer Center Support Programs:   > Cancer Support Group  2nd Tuesday of the month 1pm-2pm, Journey Room

## 2020-01-25 NOTE — Progress Notes (Signed)
[place], [place] [address]   CLINIC:  Medical Oncology/Hematology  PCP:  Redmond School, [place] / [place] [address]  [phone]  REASON FOR VISIT:  Follow-up for thrombocytopenia  PRIOR THERAPY: None  CURRENT THERAPY: Observation  INTERVAL HISTORY:  [name], a 69 y.o. male, returns for routine follow-up for his thrombocytopenia. [name] was last seen on 10/19/2019.  Today he reports having easy bruising and bleeding, especially on his arms when he bumps into things, but no other issues since the last visit. He has tolerated steroids well in the past. He will have a CT abdomen done on 8/26 to check on his abdominal aneurysm. He denies having any recent infections, F/C or night sweats. He has lost weight by drinking more water and eating smaller portions.   REVIEW OF SYSTEMS:  Review of Systems  Constitutional: Positive for fatigue. Negative for appetite change, chills, diaphoresis and fever.  Respiratory: Positive for shortness of breath.   Gastrointestinal: Positive for constipation and nausea.  Musculoskeletal: Positive for back pain (4/10 lower back pain).  Hematological: Bruises/bleeds easily.  All other systems reviewed and are negative.   PAST MEDICAL/SURGICAL HISTORY:  Past Medical History:  Diagnosis Date  . Anxiety    patient denies  . Aortic aneurysm (HCC)    ascending  . Arthritis   . Barrett esophagus   . Blood transfusion   . Cancer ([facility]) 2007   melanoma stage 3 stomach bx  . Coronary artery disease    mild by 09/08/13 cath [facility]  . GERD (gastroesophageal reflux disease)    barretts esophagus  . H/O hiatal hernia   . Headache(784.0)   . Hypercholesteremia   . Hypertension   . PONV (postoperative nausea and vomiting)    trouble breathing after last surgery (at [facility])  . Shingles    10  . Sleep apnea    not tested  . Tumor liver    no problems   Past Surgical History:   Procedure Laterality Date  . ABDOMINAL SURGERY     x3  . back surgeries     x 9  . BACK SURGERY     x8  . BIOPSY N/A 03/28/2013   Procedure: Distal Esophageal Biopsy;  Surgeon: Rogene Houston, MD;  Location: [facility];  Service: Endoscopy;  Laterality: N/A;  . BIOPSY  05/30/2016   Procedure: BIOPSY;  Surgeon: Rogene Houston, MD;  [facility];  Service: Endoscopy;;  . BIOPSY  02/19/2018   Procedure: BIOPSY;  Surgeon: Rogene Houston, MD;  [facility];  Service: Endoscopy;;  Barret's esophagus  . CHEST SURGERY     lung incisional hernia for hernia  . COLONOSCOPY WITH PROPOFOL N/A 02/19/2018   Procedure: COLONOSCOPY WITH PROPOFOL;  Surgeon: Rogene Houston, MD;  [facility];  Service: Endoscopy;  Laterality: N/A;  1:00  . ESOPHAGOGASTRODUODENOSCOPY (EGD) WITH PROPOFOL N/A 03/28/2013   Procedure: ESOPHAGOGASTRODUODENOSCOPY (EGD) WITH PROPOFOL;  Surgeon: Rogene Houston, MD;  Location: [facility];  Service: Endoscopy;  Laterality: N/A;  GE junction @ 38, proximal  margin @37   . ESOPHAGOGASTRODUODENOSCOPY (EGD) WITH PROPOFOL N/A 05/30/2016   Procedure: ESOPHAGOGASTRODUODENOSCOPY (EGD) WITH PROPOFOL;  Surgeon: Rogene Houston, MD;  [facility];  Service: Endoscopy;  Laterality: N/A;  955  . ESOPHAGOGASTRODUODENOSCOPY (EGD) WITH PROPOFOL N/A 02/19/2018   Procedure: ESOPHAGOGASTRODUODENOSCOPY (EGD) WITH PROPOFOL;  Surgeon: Rogene Houston, MD;  [facility];  Service: Endoscopy;  Laterality: N/A;  . HEMORRHOID SURGERY     x2  . HERNIA REPAIR    . HIATAL HERNIA REPAIR    . JOINT REPLACEMENT     lft knee  . KNEE ARTHROPLASTY     lft  . KNEE ARTHROSCOPY Right   . LEG SURGERY     x4 crushed   . LITHOTRIPSY    . MELANOMA EXCISION     lft abdomen  . NECK SURGERY  2012  . POLYPECTOMY  02/19/2018   Procedure: POLYPECTOMY;  Surgeon: Rogene Houston, MD;  [facility];  Service: Endoscopy;;  proximal transverse colon (CB x1),  descending colon (CBx3)  . POSTERIOR CERVICAL FUSION/FORAMINOTOMY  08/18/2011   Procedure: POSTERIOR CERVICAL FUSION/FORAMINOTOMY LEVEL 3;  Surgeon: Ophelia Charter, MD;  Location: [facility];  Service: Neurosurgery;  Laterality: N/A;  Cervical three-four, four-five, five-six posterior cervical  fusion with instrumentation; Right cervical three-four, four-five laminotomy  . REVISION TOTAL HIP ARTHROPLASTY Left   . SHOULDER ARTHROSCOPY     rt bith defect , spur, rotator cuff  . SPINAL CORD STIMULATOR IMPLANT     rt hip  . SPINAL CORD STIMULATOR INSERTION N/A 11/03/2016   Procedure: LUMBAR SPINAL CORD STIMULATOR REVISION;  Surgeon: Newman Pies, MD;  Location: [facility];  Service: Neurosurgery;  Laterality: N/A;  . THYROID LOBECTOMY     rt    SOCIAL HISTORY:  Social History   Socioeconomic History  . Marital status: Married    Spouse name: Not on file  . Number of children: Not on file  . Years of education: Not on file  . Highest education level: Not on file  Occupational History  . Occupation: retired  Tobacco Use  . Smoking status: Former Smoker    Packs/day: 4.00    Years: 10.00    Pack years: 40.00    Types: Cigarettes    Quit date: 08/12/1980    Years since quitting: 39.4  . Smokeless tobacco: Never Used  . Tobacco comment: occ wine  Vaping Use  . Vaping Use: Never used  Substance and Sexual Activity  . Alcohol use: Yes    Alcohol/week: 0.0 standard drinks    Comment: rarely  . Drug use: No  . Sexual activity: Yes    Birth control/protection: None  Other Topics Concern  . Not on file  Social History Narrative  . Not on file   Social Determinants of Health   Financial Resource Strain:   . Difficulty of Paying Living Expenses: Not on file  Food Insecurity:   . Worried About Charity fundraiser in the Last Year: Not on file  . Ran Out of Food in the Last Year: Not on file  Transportation Needs:   . Lack of Transportation (Medical): Not on file  . Lack of  Transportation (Non-Medical): Not on file  Physical Activity:   . Days of Exercise per Week: Not on file  . Minutes of Exercise per Session: Not on file  Stress:   . Feeling of Stress : Not on file  Social Connections:   . Frequency of Communication with Friends and Family: Not on file  . Frequency of Social Gatherings with Friends and Family: Not on file  . Attends Religious Services: Not on file  . Active Member of Clubs or Organizations: Not on file  . Attends Archivist Meetings: Not on file  . Marital Status: Not on file  Intimate Partner  Violence:   . Fear of Current or Ex-Partner: Not on file  . Emotionally Abused: Not on file  . Physically Abused: Not on file  . Sexually Abused: Not on file    FAMILY HISTORY:  Family History  Problem Relation Age of Onset  . Heart disease Father   . Aneurysm Mother   . Aneurysm Sister   . Heart disease Paternal Grandmother   . Heart disease Paternal Grandfather   . Heart disease Maternal Grandfather   . Heart disease Brother        heart transplant  . Aneurysm Other        pt states several members have aortic aneurysm  . Skin cancer Other        several members  . Lymphoma Brother   . Aneurysm Maternal Grandmother     CURRENT MEDICATIONS:  Current Outpatient Medications  Medication Sig Dispense Refill  . aspirin EC 81 MG tablet Take 81 mg by mouth. Patient states that he takes 3 times a week    . Aspirin-Acetaminophen-Caffeine (GOODY HEADACHE PO) Take 1-2 packets by mouth daily as needed (headaches).    Marland Kitchen Bioflavonoid Products (ESTER C PO) Take 1 tablet by mouth daily.     . calcium-vitamin D (OSCAL WITH D) 500-200 MG-UNIT per tablet Take 1 tablet by mouth 2 (two) times daily.     . clopidogrel (PLAVIX) 75 MG tablet TAKE 1 TABLET BY MOUTH ONCE DAILY START DATE 9 10 2020    . cyclobenzaprine (FLEXERIL) 10 MG tablet Take 1 tablet (10 mg total) by mouth 3 (three) times daily as needed for muscle spasms. 50 tablet 1  .  dexlansoprazole (DEXILANT) 60 MG capsule Take 60 mg by mouth daily.     . diazepam (VALIUM) 10 MG tablet Take 10 mg by mouth 4 (four) times daily as needed (for back pain).     . fexofenadine (ALLEGRA) 180 MG tablet Take 180 mg by mouth every evening.    . fish oil-omega-3 fatty acids 1000 MG capsule Take 1 g by mouth 2 (two) times daily.    Marland Kitchen lisinopril (PRINIVIL,ZESTRIL) 10 MG tablet Take 10 mg by mouth daily.    . metoCLOPramide (REGLAN) 10 MG tablet TAKE ONE TABLET BY MOUTH TWICE A DAY AS NEEDED FOR NAUSEA 60 tablet 2  . montelukast (SINGULAIR) 10 MG tablet Take 10 mg by mouth daily.     . Multiple Vitamin (MULITIVITAMIN WITH MINERALS) TABS Take 1 tablet by mouth daily.    . ondansetron (ZOFRAN) 4 MG tablet Take 1 tablet (4 mg total) by mouth every 8 (eight) hours as needed for nausea or vomiting. 6 tablet 0  . oxyCODONE-acetaminophen (PERCOCET) 10-325 MG tablet Take 1 tablet by mouth every 6 (six) hours as needed for pain.    . rosuvastatin (CRESTOR) 10 MG tablet Take 10 mg by mouth every evening.     . Prucalopride Succinate (MOTEGRITY) 2 MG TABS Take 1 tablet (2 mg total) by mouth daily with breakfast. (Patient not taking: Reported on 01/25/2020) 30 tablet 5   No current facility-administered medications for this visit.    ALLERGIES:  Allergies  Allergen Reactions  . Demerol Other (See Comments)    "Makes me crazy."  . Contrast Media [Iodinated Diagnostic Agents] Nausea Only    PHYSICAL EXAM:  Performance status (ECOG): 1 - Symptomatic but completely ambulatory  Vitals:   01/25/20 1138  BP: 114/85  Pulse: 70  Resp: 18  Temp: (!) 96.9 F (36.1 C)  SpO2: 95%   Wt Readings from Last 3 Encounters:  01/25/20 237 lb 4.8 oz (107.6 kg)  11/22/19 243 lb 1.6 oz (110.3 kg)  10/19/19 244 lb 8 oz (110.9 kg)   Physical Exam Vitals reviewed.  Constitutional:      Appearance: Normal appearance. He is obese.  Cardiovascular:     Rate and Rhythm: Normal rate and regular rhythm.      Pulses: Normal pulses.     Heart sounds: Normal heart sounds.  Pulmonary:     Effort: Pulmonary effort is normal.     Breath sounds: Normal breath sounds.  Abdominal:     Palpations: Abdomen is soft. There is no mass.     Tenderness: There is no abdominal tenderness.  Musculoskeletal:     Right lower leg: No edema.     Left lower leg: No edema.  Neurological:     General: No focal deficit present.     Mental Status: He is alert and oriented to person, place, and time.  Psychiatric:        Mood and Affect: Mood normal.        Behavior: Behavior normal.     LABORATORY DATA:  I have reviewed the labs as listed.  CBC Latest Ref Rng & Units 01/18/2020 10/12/2019 06/09/2019  WBC 4.0 - 10.5 K/uL 2.6(L) 2.3(L) 2.7(L)  Hemoglobin 13.0 - 17.0 g/dL 13.1 13.7 14.1  Hematocrit 39 - 52 % 40.3 42.0 42.8  Platelets 150 - 400 K/uL 56(L) 64(L) 73(L)   CMP Latest Ref Rng & Units 01/18/2020 06/09/2019 03/01/2019  Glucose 70 - 99 mg/dL 115(H) 96 96  BUN 8 - 23 mg/dL 16 18 21   Creatinine 0.61 - 1.24 mg/dL 1.31(H) 1.18 1.11  Sodium 135 - 145 mmol/L 137 140 139  Potassium 3.5 - 5.1 mmol/L 4.3 4.0 4.2  Chloride 98 - 111 mmol/L 106 105 108  CO2 22 - 32 mmol/L 23 26 23   Calcium 8.9 - 10.3 mg/dL 9.1 9.1 9.3  Total Protein 6.5 - 8.1 g/dL 7.1 7.1 -  Total Bilirubin 0.3 - 1.2 mg/dL 0.8 0.9 -  Alkaline Phos 38 - 126 U/L 59 54 -  AST 15 - 41 U/L 27 25 -  ALT 0 - 44 U/L 22 21 -      Component Value Date/Time   RBC 4.45 01/18/2020 1304   MCV 90.6 01/18/2020 1304   MCH 29.4 01/18/2020 1304   MCHC 32.5 01/18/2020 1304   RDW 13.5 01/18/2020 1304   LYMPHSABS 0.9 01/18/2020 1304   MONOABS 0.2 01/18/2020 1304   EOSABS 0.1 01/18/2020 1304   BASOSABS 0.0 01/18/2020 1304   Lab Results  Component Value Date   LDH 162 01/18/2020   LDH 150 10/12/2019   LDH 141 06/09/2019    DIAGNOSTIC IMAGING:  I have independently reviewed the scans and discussed with the patient. No results found.   ASSESSMENT:  1.   Moderate thrombocytopenia: -Seen as referral from Dr. Nolon Rod office for decreased platelet count of 80 on CBC on 10/21/2018. -Ex-smoker, quit in 1982, smoked 1 to 2 packs/day for 10 years.  He also spent 2 years at [facility]. -CT abdomen on 11/08/2018 showed normal size spleen with no abdominal or pelvic pathology. -BMBX on 03/01/2019 showed slightly hypercellular marrow for age with trilineage hematopoiesis including abundant megakaryocytes and predominantly normal morphology.  Overall myeloid changes are nonspecific and not diagnostic for MDS.  Chromosome analysis was normal.  MDS FISH panel was normal.  Core biopsy  showed small lymphoid aggregates, features are not considered specific or diagnostic of lymphoproliferative disorder.  2.  Malignant melanoma: -Melanoma resected on the left side of the abdomen with left axillary sentinel lymph node biopsy at [facility] 14 years ago.  Reportedly stage III.   PLAN:  1.  Moderate thrombocytopenia: -Platelet count continuously dropping for the last 1 year.  Today CBC shows platelet count 56. -I have recommended a trial of dexamethasone 40 mg for 4 days. -He will start on 01/27/2020.  I plan to repeat his platelet count on 02/06/2020.  We will place a phone visit at that time.  2.  Mild leukopenia: -White count improved to 2.6 with ANC of 1300 compared to last CBC. -We will monitor to see if there is any improvement after dexamethasone.  3.  Malignant melanoma: -No evidence of lymphadenopathy or recurrence at this time.  LFTs are normal.  LDH was normal.  Orders placed this encounter:  No orders of the defined types were placed in this encounter.    Derek Jack, MD [place] [phone]   I, Milinda Antis, am acting as a scribe for Dr. Sanda Linger.  I, Derek Jack MD, have reviewed the above documentation for accuracy and completeness, and I agree with the above.

## 2020-01-26 DIAGNOSIS — K449 Diaphragmatic hernia without obstruction or gangrene: Secondary | ICD-10-CM | POA: Diagnosis not present

## 2020-01-26 DIAGNOSIS — I878 Other specified disorders of veins: Secondary | ICD-10-CM | POA: Diagnosis not present

## 2020-01-26 DIAGNOSIS — I700 Atherosclerosis of aorta: Secondary | ICD-10-CM | POA: Diagnosis not present

## 2020-01-26 DIAGNOSIS — I712 Thoracic aortic aneurysm, without rupture: Secondary | ICD-10-CM | POA: Diagnosis not present

## 2020-01-26 DIAGNOSIS — I2510 Atherosclerotic heart disease of native coronary artery without angina pectoris: Secondary | ICD-10-CM | POA: Diagnosis not present

## 2020-01-31 DIAGNOSIS — R0602 Shortness of breath: Secondary | ICD-10-CM | POA: Diagnosis not present

## 2020-01-31 DIAGNOSIS — I2510 Atherosclerotic heart disease of native coronary artery without angina pectoris: Secondary | ICD-10-CM | POA: Diagnosis not present

## 2020-02-07 ENCOUNTER — Inpatient Hospital Stay (HOSPITAL_COMMUNITY): Payer: Medicare Other | Attending: Hematology

## 2020-02-07 ENCOUNTER — Inpatient Hospital Stay (HOSPITAL_COMMUNITY): Payer: Medicare Other | Attending: Hematology | Admitting: Hematology

## 2020-02-07 ENCOUNTER — Other Ambulatory Visit: Payer: Self-pay

## 2020-02-07 DIAGNOSIS — D696 Thrombocytopenia, unspecified: Secondary | ICD-10-CM

## 2020-02-07 LAB — CBC WITH DIFFERENTIAL/PLATELET
Abs Immature Granulocytes: 0.05 10*3/uL (ref 0.00–0.07)
Basophils Absolute: 0 10*3/uL (ref 0.0–0.1)
Basophils Relative: 0 %
Eosinophils Absolute: 0.1 10*3/uL (ref 0.0–0.5)
Eosinophils Relative: 1 %
HCT: 43.2 % (ref 39.0–52.0)
Hemoglobin: 13.9 g/dL (ref 13.0–17.0)
Immature Granulocytes: 1 %
Lymphocytes Relative: 26 %
Lymphs Abs: 1 10*3/uL (ref 0.7–4.0)
MCH: 29.6 pg (ref 26.0–34.0)
MCHC: 32.2 g/dL (ref 30.0–36.0)
MCV: 91.9 fL (ref 80.0–100.0)
Monocytes Absolute: 0.5 10*3/uL (ref 0.1–1.0)
Monocytes Relative: 12 %
Neutro Abs: 2.4 10*3/uL (ref 1.7–7.7)
Neutrophils Relative %: 60 %
Platelets: 72 10*3/uL — ABNORMAL LOW (ref 150–400)
RBC: 4.7 MIL/uL (ref 4.22–5.81)
RDW: 13.7 % (ref 11.5–15.5)
WBC: 4 10*3/uL (ref 4.0–10.5)
nRBC: 0 % (ref 0.0–0.2)

## 2020-02-07 NOTE — Progress Notes (Signed)
Virtual Visit via Telephone Note  I connected with [name] on 02/07/20 at  4:00 PM EDT by telephone and verified that I am speaking with the correct person using two identifiers.   I discussed the limitations, risks, security and privacy concerns of performing an evaluation and management service by telephone and the availability of in person appointments. I also discussed with the patient that there may be a patient responsible charge related to this service. The patient expressed understanding and agreed to proceed.   History of Present Illness: He was initially seen for follow-up of low platelet count.  He also had existing low white count.  BMBX on 02/20/2019 showed hypercellular marrow for age with trilineage hematopoiesis including abundant megakaryocytes with normal morphology.  Overall myeloid changes are nonspecific and not diagnostic for MDS.  Chromosome analysis was normal.  MDS FISH panel was normal.   Observations/Objective: He started taking dexamethasone for 4 days on 01/27/2020.  It made him not sleep well.  He reported all of chest pain last week and was seen by his cardiologist and had a stress test done last Tuesday which was reportedly negative.  Reports some shortness of breath on exertion which is stable.  Fatigue is also stable.  Appetite is 100%.  Assessment and Plan:  1.  Moderate thrombocytopenia: -Trial of pulse dexamethasone on 01/27/2020 through 01/30/2020 -Platelet count improved to 72 from 56. -However dexamethasone made him difficult to sleep. -No intervention necessary at this time.  We will follow him in 3 months. -He was told to come back sooner should he develop any bleeding or infections.  2.  Mild leukopenia: -His white count today is 4.0 with normal differential and normal ANC.  This has improved from 2.6 and 2.3 previously.  This suggests some autoimmune component.   Follow Up Instructions: RTC 3 months with labs.   I discussed the assessment  and treatment plan with the patient. The patient was provided an opportunity to ask questions and all were answered. The patient agreed with the plan and demonstrated an understanding of the instructions.   The patient was advised to call back or seek an in-person evaluation if the symptoms worsen or if the condition fails to improve as anticipated.  I provided 11 minutes of non-face-to-face time during this encounter.   Derek Jack, MD

## 2020-02-20 DIAGNOSIS — G4733 Obstructive sleep apnea (adult) (pediatric): Secondary | ICD-10-CM | POA: Diagnosis not present

## 2020-02-27 DIAGNOSIS — I7789 Other specified disorders of arteries and arterioles: Secondary | ICD-10-CM | POA: Diagnosis not present

## 2020-02-27 DIAGNOSIS — I10 Essential (primary) hypertension: Secondary | ICD-10-CM | POA: Diagnosis not present

## 2020-02-27 DIAGNOSIS — I712 Thoracic aortic aneurysm, without rupture: Secondary | ICD-10-CM | POA: Diagnosis not present

## 2020-02-27 DIAGNOSIS — I2510 Atherosclerotic heart disease of native coronary artery without angina pectoris: Secondary | ICD-10-CM | POA: Diagnosis not present

## 2020-02-27 DIAGNOSIS — R079 Chest pain, unspecified: Secondary | ICD-10-CM | POA: Diagnosis not present

## 2020-03-01 DIAGNOSIS — M1991 Primary osteoarthritis, unspecified site: Secondary | ICD-10-CM | POA: Diagnosis not present

## 2020-03-01 DIAGNOSIS — I719 Aortic aneurysm of unspecified site, without rupture: Secondary | ICD-10-CM | POA: Diagnosis not present

## 2020-03-01 DIAGNOSIS — G894 Chronic pain syndrome: Secondary | ICD-10-CM | POA: Diagnosis not present

## 2020-03-21 DIAGNOSIS — G4733 Obstructive sleep apnea (adult) (pediatric): Secondary | ICD-10-CM | POA: Diagnosis not present

## 2020-03-27 DIAGNOSIS — I10 Essential (primary) hypertension: Secondary | ICD-10-CM | POA: Diagnosis not present

## 2020-03-27 DIAGNOSIS — Z955 Presence of coronary angioplasty implant and graft: Secondary | ICD-10-CM | POA: Diagnosis not present

## 2020-03-27 DIAGNOSIS — I7789 Other specified disorders of arteries and arterioles: Secondary | ICD-10-CM | POA: Diagnosis not present

## 2020-03-27 DIAGNOSIS — R9431 Abnormal electrocardiogram [ECG] [EKG]: Secondary | ICD-10-CM | POA: Diagnosis not present

## 2020-03-27 DIAGNOSIS — R079 Chest pain, unspecified: Secondary | ICD-10-CM | POA: Diagnosis not present

## 2020-03-27 DIAGNOSIS — I2510 Atherosclerotic heart disease of native coronary artery without angina pectoris: Secondary | ICD-10-CM | POA: Diagnosis not present

## 2020-03-29 DIAGNOSIS — R0600 Dyspnea, unspecified: Secondary | ICD-10-CM | POA: Diagnosis not present

## 2020-03-29 DIAGNOSIS — F17210 Nicotine dependence, cigarettes, uncomplicated: Secondary | ICD-10-CM | POA: Diagnosis not present

## 2020-04-12 DIAGNOSIS — Z87891 Personal history of nicotine dependence: Secondary | ICD-10-CM | POA: Diagnosis not present

## 2020-04-12 DIAGNOSIS — R0600 Dyspnea, unspecified: Secondary | ICD-10-CM | POA: Diagnosis not present

## 2020-04-21 DIAGNOSIS — G4733 Obstructive sleep apnea (adult) (pediatric): Secondary | ICD-10-CM | POA: Diagnosis not present

## 2020-04-24 DIAGNOSIS — G4733 Obstructive sleep apnea (adult) (pediatric): Secondary | ICD-10-CM | POA: Diagnosis not present

## 2020-05-01 DIAGNOSIS — I2510 Atherosclerotic heart disease of native coronary artery without angina pectoris: Secondary | ICD-10-CM | POA: Diagnosis not present

## 2020-05-01 DIAGNOSIS — I10 Essential (primary) hypertension: Secondary | ICD-10-CM | POA: Diagnosis not present

## 2020-05-01 DIAGNOSIS — M1991 Primary osteoarthritis, unspecified site: Secondary | ICD-10-CM | POA: Diagnosis not present

## 2020-05-01 DIAGNOSIS — I712 Thoracic aortic aneurysm, without rupture: Secondary | ICD-10-CM | POA: Diagnosis not present

## 2020-05-01 DIAGNOSIS — I719 Aortic aneurysm of unspecified site, without rupture: Secondary | ICD-10-CM | POA: Diagnosis not present

## 2020-05-01 DIAGNOSIS — G894 Chronic pain syndrome: Secondary | ICD-10-CM | POA: Diagnosis not present

## 2020-05-01 DIAGNOSIS — E785 Hyperlipidemia, unspecified: Secondary | ICD-10-CM | POA: Diagnosis not present

## 2020-05-09 ENCOUNTER — Other Ambulatory Visit: Payer: Self-pay

## 2020-05-09 ENCOUNTER — Inpatient Hospital Stay (HOSPITAL_COMMUNITY): Payer: Medicare Other | Admitting: Hematology and Oncology

## 2020-05-09 ENCOUNTER — Inpatient Hospital Stay (HOSPITAL_COMMUNITY): Payer: Medicare Other | Attending: Hematology and Oncology

## 2020-05-09 DIAGNOSIS — Z8249 Family history of ischemic heart disease and other diseases of the circulatory system: Secondary | ICD-10-CM | POA: Insufficient documentation

## 2020-05-09 DIAGNOSIS — R0602 Shortness of breath: Secondary | ICD-10-CM | POA: Insufficient documentation

## 2020-05-09 DIAGNOSIS — R110 Nausea: Secondary | ICD-10-CM | POA: Insufficient documentation

## 2020-05-09 DIAGNOSIS — D693 Immune thrombocytopenic purpura: Secondary | ICD-10-CM | POA: Insufficient documentation

## 2020-05-09 DIAGNOSIS — K5900 Constipation, unspecified: Secondary | ICD-10-CM | POA: Insufficient documentation

## 2020-05-09 DIAGNOSIS — Z85820 Personal history of malignant melanoma of skin: Secondary | ICD-10-CM | POA: Insufficient documentation

## 2020-05-09 DIAGNOSIS — R61 Generalized hyperhidrosis: Secondary | ICD-10-CM | POA: Diagnosis not present

## 2020-05-09 DIAGNOSIS — R5383 Other fatigue: Secondary | ICD-10-CM | POA: Diagnosis not present

## 2020-05-09 DIAGNOSIS — M549 Dorsalgia, unspecified: Secondary | ICD-10-CM | POA: Insufficient documentation

## 2020-05-09 DIAGNOSIS — D696 Thrombocytopenia, unspecified: Secondary | ICD-10-CM | POA: Insufficient documentation

## 2020-05-09 DIAGNOSIS — Z808 Family history of malignant neoplasm of other organs or systems: Secondary | ICD-10-CM | POA: Insufficient documentation

## 2020-05-09 DIAGNOSIS — Z79899 Other long term (current) drug therapy: Secondary | ICD-10-CM | POA: Diagnosis not present

## 2020-05-09 DIAGNOSIS — D72819 Decreased white blood cell count, unspecified: Secondary | ICD-10-CM | POA: Diagnosis not present

## 2020-05-09 DIAGNOSIS — Z87891 Personal history of nicotine dependence: Secondary | ICD-10-CM | POA: Diagnosis not present

## 2020-05-09 LAB — COMPREHENSIVE METABOLIC PANEL
ALT: 24 U/L (ref 0–44)
AST: 27 U/L (ref 15–41)
Albumin: 4.1 g/dL (ref 3.5–5.0)
Alkaline Phosphatase: 67 U/L (ref 38–126)
Anion gap: 9 (ref 5–15)
BUN: 23 mg/dL (ref 8–23)
CO2: 23 mmol/L (ref 22–32)
Calcium: 9.2 mg/dL (ref 8.9–10.3)
Chloride: 105 mmol/L (ref 98–111)
Creatinine, Ser: 1.46 mg/dL — ABNORMAL HIGH (ref 0.61–1.24)
GFR, Estimated: 52 mL/min — ABNORMAL LOW (ref >60)
Glucose, Bld: 138 mg/dL — ABNORMAL HIGH (ref 70–99)
Potassium: 3.8 mmol/L (ref 3.5–5.1)
Sodium: 137 mmol/L (ref 135–145)
Total Bilirubin: 0.8 mg/dL (ref 0.3–1.2)
Total Protein: 6.7 g/dL (ref 6.5–8.1)

## 2020-05-09 LAB — CBC WITH DIFFERENTIAL/PLATELET
Abs Immature Granulocytes: 0.01 10*3/uL (ref 0.00–0.07)
Basophils Absolute: 0 10*3/uL (ref 0.0–0.1)
Basophils Relative: 1 %
Eosinophils Absolute: 0 10*3/uL (ref 0.0–0.5)
Eosinophils Relative: 1 %
HCT: 41.4 % (ref 39.0–52.0)
Hemoglobin: 13.5 g/dL (ref 13.0–17.0)
Immature Granulocytes: 1 %
Lymphocytes Relative: 40 %
Lymphs Abs: 0.8 10*3/uL (ref 0.7–4.0)
MCH: 30.3 pg (ref 26.0–34.0)
MCHC: 32.6 g/dL (ref 30.0–36.0)
MCV: 93 fL (ref 80.0–100.0)
Monocytes Absolute: 0.2 10*3/uL (ref 0.1–1.0)
Monocytes Relative: 7 %
Neutro Abs: 1 10*3/uL — ABNORMAL LOW (ref 1.7–7.7)
Neutrophils Relative %: 50 %
Platelets: 49 10*3/uL — ABNORMAL LOW (ref 150–400)
RBC: 4.45 MIL/uL (ref 4.22–5.81)
RDW: 12.9 % (ref 11.5–15.5)
WBC: 2.1 10*3/uL — ABNORMAL LOW (ref 4.0–10.5)
nRBC: 0 % (ref 0.0–0.2)

## 2020-05-09 LAB — LACTATE DEHYDROGENASE: LDH: 138 U/L (ref 98–192)

## 2020-05-09 NOTE — Assessment & Plan Note (Signed)
1.  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 We will follow him in 3 months. -He was told to come back sooner should he develop any bleeding or infections.  2.  Mild leukopenia:

## 2020-05-09 NOTE — Progress Notes (Signed)
Patient Care Team: Redmond School, MD as PCP - General (Internal Medicine) Derek Jack, MD as Consulting Physician (Hematology)  DIAGNOSIS:  Encounter Diagnoses  Name Primary?  . Thrombocytopenia ([facility])   . Acute ITP (HCC)    CHIEF COMPLIANT: Follow-up of leukopenia and thrombocytopenia related to ITP  INTERVAL HISTORY: [name] is a 69 year old gentleman with above-mentioned history of longstanding leukopenia and thrombocytopenia.  He will need a bone marrow biopsy in September 2020 which was negative for any malignant process.  He has had a slow progressive worsening of his platelet counts.  He was treated with dexamethasone which led to improvement in his platelet count but he could not tolerate it at all.  He felt extremely miserable and could not sleep throughout the treatment.  He does not have any active bleeding.  He does have bruises on his arms.  He denies any fevers or chills or any recent infections.  Previous work-up including ANA was negative.  He is scheduled to undergo heart catheterization tomorrow.  His major complaint is fatigue.   ALLERGIES:  is allergic to demerol and contrast media [iodinated diagnostic agents].  MEDICATIONS:  Current Outpatient Medications  Medication Sig Dispense Refill  . Aspirin-Acetaminophen-Caffeine (GOODY HEADACHE PO) Take 1-2 packets by mouth daily as needed (headaches).    Marland Kitchen Bioflavonoid Products (ESTER C PO) Take 1 tablet by mouth daily.     . calcium-vitamin D (OSCAL WITH D) 500-200 MG-UNIT per tablet Take 1 tablet by mouth 2 (two) times daily.     . clopidogrel (PLAVIX) 75 MG tablet TAKE 1 TABLET BY MOUTH ONCE DAILY START DATE 9 10 2020    . cyclobenzaprine (FLEXERIL) 10 MG tablet Take 1 tablet (10 mg total) by mouth 3 (three) times daily as needed for muscle spasms. 50 tablet 1  . dexlansoprazole (DEXILANT) 60 MG capsule Take 60 mg by mouth daily.     . diazepam (VALIUM) 10 MG tablet Take 10 mg by mouth 4 (four) times  daily as needed (for back pain).     . fexofenadine (ALLEGRA) 180 MG tablet Take 180 mg by mouth every evening.    . fish oil-omega-3 fatty acids 1000 MG capsule Take 1 g by mouth 2 (two) times daily.    Marland Kitchen lisinopril (PRINIVIL,ZESTRIL) 10 MG tablet Take 10 mg by mouth daily.    . metoCLOPramide (REGLAN) 10 MG tablet TAKE ONE TABLET BY MOUTH TWICE A DAY AS NEEDED FOR NAUSEA 60 tablet 2  . montelukast (SINGULAIR) 10 MG tablet Take 10 mg by mouth daily.     . Multiple Vitamin (MULITIVITAMIN WITH MINERALS) TABS Take 1 tablet by mouth daily.    . ondansetron (ZOFRAN) 4 MG tablet Take 1 tablet (4 mg total) by mouth every 8 (eight) hours as needed for nausea or vomiting. 6 tablet 0  . oxyCODONE-acetaminophen (PERCOCET) 10-325 MG tablet Take 1 tablet by mouth every 6 (six) hours as needed for pain.    . pregabalin (LYRICA) 150 MG capsule Take by mouth.    . ranolazine (RANEXA) 500 MG 12 hr tablet Take 500 mg by mouth 2 (two) times daily.    . rosuvastatin (CRESTOR) 10 MG tablet Take 10 mg by mouth every evening.      No current facility-administered medications for this visit.    PHYSICAL EXAMINATION: ECOG PERFORMANCE STATUS: 1 - Symptomatic but completely ambulatory  Vitals:   05/09/20 1105  BP: 128/72  Pulse: 85  Resp: 18  Temp: (!) 97.2 F (36.2  C)  SpO2: 98%   Filed Weights   05/09/20 1105  Weight: 251 lb (113.9 kg)      LABORATORY DATA:  I have reviewed the data as listed CMP Latest Ref Rng & Units 05/09/2020 01/18/2020 06/09/2019  Glucose 70 - 99 mg/dL 138(H) 115(H) 96  BUN 8 - 23 mg/dL 23 16 18   Creatinine 0.61 - 1.24 mg/dL 1.46(H) 1.31(H) 1.18  Sodium 135 - 145 mmol/L 137 137 140  Potassium 3.5 - 5.1 mmol/L 3.8 4.3 4.0  Chloride 98 - 111 mmol/L 105 106 105  CO2 22 - 32 mmol/L 23 23 26   Calcium 8.9 - 10.3 mg/dL 9.2 9.1 9.1  Total Protein 6.5 - 8.1 g/dL 6.7 7.1 7.1  Total Bilirubin 0.3 - 1.2 mg/dL 0.8 0.8 0.9  Alkaline Phos 38 - 126 U/L 67 59 54  AST 15 - 41 U/L 27 27 25    ALT 0 - 44 U/L 24 22 21     Lab Results  Component Value Date   WBC 2.1 (L) 05/09/2020   HGB 13.5 05/09/2020   HCT 41.4 05/09/2020   MCV 93.0 05/09/2020   PLT 49 (L) 05/09/2020   NEUTROABS 1.0 (L) 05/09/2020    ASSESSMENT & PLAN:  Thrombocytopenia ([facility]) 1.  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 Today's platelet count is 49 Because of his intolerance to prior dexamethasone, I recommended continue with IVIG treatment. We will administer IVIG 400 mg/kg daily for 5 days.  2.  Mild leukopenia: Especially neutropenia: ANC 1 Previous work-up including ANA was negative Bone marrow biopsy September 2020: Hypercellular marrow but no evidence of MDS  Return to clinic in 2 weeks to recheck labs and follow-up  Orders Placed This Encounter  Procedures  . CBC with Differential (Cancer Center Only)    Standing Status:   Future    Standing Expiration Date:   05/09/2021  . Immature Platelet Fraction    Standing Status:   Future    Standing Expiration Date:   05/09/2021   The patient has a good understanding of the overall plan. he agrees with it. he will call with any problems that may develop before the next visit here. Total time spent: 45 mins including face to face time and time spent for planning, charting and co-ordination of care   Harriette Ohara, MD 05/09/20

## 2020-05-10 DIAGNOSIS — D696 Thrombocytopenia, unspecified: Secondary | ICD-10-CM | POA: Diagnosis not present

## 2020-05-10 DIAGNOSIS — I2510 Atherosclerotic heart disease of native coronary artery without angina pectoris: Secondary | ICD-10-CM | POA: Diagnosis not present

## 2020-05-10 DIAGNOSIS — T82855A Stenosis of coronary artery stent, initial encounter: Secondary | ICD-10-CM | POA: Diagnosis not present

## 2020-05-10 DIAGNOSIS — Z87891 Personal history of nicotine dependence: Secondary | ICD-10-CM | POA: Diagnosis not present

## 2020-05-10 DIAGNOSIS — R0609 Other forms of dyspnea: Secondary | ICD-10-CM | POA: Diagnosis not present

## 2020-05-10 DIAGNOSIS — Z955 Presence of coronary angioplasty implant and graft: Secondary | ICD-10-CM | POA: Diagnosis not present

## 2020-05-14 ENCOUNTER — Encounter (HOSPITAL_COMMUNITY): Payer: Self-pay

## 2020-05-14 ENCOUNTER — Inpatient Hospital Stay (HOSPITAL_COMMUNITY): Payer: Medicare Other

## 2020-05-14 ENCOUNTER — Other Ambulatory Visit: Payer: Self-pay

## 2020-05-14 VITALS — BP 129/74 | HR 59 | Temp 96.8°F | Resp 18 | Wt 260.0 lb

## 2020-05-14 DIAGNOSIS — D72819 Decreased white blood cell count, unspecified: Secondary | ICD-10-CM | POA: Diagnosis not present

## 2020-05-14 DIAGNOSIS — R5383 Other fatigue: Secondary | ICD-10-CM | POA: Diagnosis not present

## 2020-05-14 DIAGNOSIS — R61 Generalized hyperhidrosis: Secondary | ICD-10-CM | POA: Diagnosis not present

## 2020-05-14 DIAGNOSIS — D693 Immune thrombocytopenic purpura: Secondary | ICD-10-CM

## 2020-05-14 DIAGNOSIS — D696 Thrombocytopenia, unspecified: Secondary | ICD-10-CM | POA: Diagnosis not present

## 2020-05-14 DIAGNOSIS — Z87891 Personal history of nicotine dependence: Secondary | ICD-10-CM | POA: Diagnosis not present

## 2020-05-14 DIAGNOSIS — R110 Nausea: Secondary | ICD-10-CM | POA: Diagnosis not present

## 2020-05-14 DIAGNOSIS — Z808 Family history of malignant neoplasm of other organs or systems: Secondary | ICD-10-CM | POA: Diagnosis not present

## 2020-05-14 DIAGNOSIS — R0602 Shortness of breath: Secondary | ICD-10-CM | POA: Diagnosis not present

## 2020-05-14 DIAGNOSIS — K5900 Constipation, unspecified: Secondary | ICD-10-CM | POA: Diagnosis not present

## 2020-05-14 DIAGNOSIS — Z8249 Family history of ischemic heart disease and other diseases of the circulatory system: Secondary | ICD-10-CM | POA: Diagnosis not present

## 2020-05-14 DIAGNOSIS — M549 Dorsalgia, unspecified: Secondary | ICD-10-CM | POA: Diagnosis not present

## 2020-05-14 DIAGNOSIS — Z79899 Other long term (current) drug therapy: Secondary | ICD-10-CM | POA: Diagnosis not present

## 2020-05-14 DIAGNOSIS — Z85820 Personal history of malignant melanoma of skin: Secondary | ICD-10-CM | POA: Diagnosis not present

## 2020-05-14 MED ORDER — DIPHENHYDRAMINE HCL 25 MG PO CAPS
ORAL_CAPSULE | ORAL | Status: AC
  Filled 2020-05-14: qty 1

## 2020-05-14 MED ORDER — IMMUNE GLOBULIN (HUMAN) 20 GM/200ML IV SOLN
40.0000 g | INTRAVENOUS | Status: DC
  Administered 2020-05-14: 40 g via INTRAVENOUS
  Filled 2020-05-14 (×2): qty 400

## 2020-05-14 MED ORDER — ACETAMINOPHEN 325 MG PO TABS
650.0000 mg | ORAL_TABLET | Freq: Once | ORAL | Status: AC
  Administered 2020-05-14: 650 mg via ORAL

## 2020-05-14 MED ORDER — DEXTROSE 5 % IV SOLN
Freq: Once | INTRAVENOUS | Status: AC
Start: 2020-05-14 — End: 2020-05-14

## 2020-05-14 MED ORDER — ACETAMINOPHEN 325 MG PO TABS
ORAL_TABLET | ORAL | Status: AC
  Filled 2020-05-14: qty 2

## 2020-05-14 MED ORDER — DIPHENHYDRAMINE HCL 25 MG PO TABS
25.0000 mg | ORAL_TABLET | Freq: Once | ORAL | Status: AC
  Administered 2020-05-14: 25 mg via ORAL
  Filled 2020-05-14: qty 1

## 2020-05-14 NOTE — Patient Instructions (Signed)

## 2020-05-14 NOTE — Progress Notes (Signed)
Tolerated IVIG well today without incidence.  Vital signs stable prior to discharge.  Discharged ambulatory in stable condition.

## 2020-05-15 ENCOUNTER — Encounter (HOSPITAL_COMMUNITY): Payer: Self-pay

## 2020-05-15 ENCOUNTER — Inpatient Hospital Stay (HOSPITAL_COMMUNITY): Payer: Medicare Other

## 2020-05-15 VITALS — BP 113/72 | HR 63 | Temp 97.0°F | Resp 18

## 2020-05-15 DIAGNOSIS — D72819 Decreased white blood cell count, unspecified: Secondary | ICD-10-CM | POA: Diagnosis not present

## 2020-05-15 DIAGNOSIS — K5900 Constipation, unspecified: Secondary | ICD-10-CM | POA: Diagnosis not present

## 2020-05-15 DIAGNOSIS — D693 Immune thrombocytopenic purpura: Secondary | ICD-10-CM | POA: Diagnosis not present

## 2020-05-15 DIAGNOSIS — R0602 Shortness of breath: Secondary | ICD-10-CM | POA: Diagnosis not present

## 2020-05-15 DIAGNOSIS — R5383 Other fatigue: Secondary | ICD-10-CM | POA: Diagnosis not present

## 2020-05-15 DIAGNOSIS — Z85820 Personal history of malignant melanoma of skin: Secondary | ICD-10-CM | POA: Diagnosis not present

## 2020-05-15 DIAGNOSIS — D696 Thrombocytopenia, unspecified: Secondary | ICD-10-CM | POA: Diagnosis not present

## 2020-05-15 DIAGNOSIS — Z8249 Family history of ischemic heart disease and other diseases of the circulatory system: Secondary | ICD-10-CM | POA: Diagnosis not present

## 2020-05-15 DIAGNOSIS — Z808 Family history of malignant neoplasm of other organs or systems: Secondary | ICD-10-CM | POA: Diagnosis not present

## 2020-05-15 DIAGNOSIS — Z87891 Personal history of nicotine dependence: Secondary | ICD-10-CM | POA: Diagnosis not present

## 2020-05-15 DIAGNOSIS — M549 Dorsalgia, unspecified: Secondary | ICD-10-CM | POA: Diagnosis not present

## 2020-05-15 DIAGNOSIS — R61 Generalized hyperhidrosis: Secondary | ICD-10-CM | POA: Diagnosis not present

## 2020-05-15 DIAGNOSIS — Z79899 Other long term (current) drug therapy: Secondary | ICD-10-CM | POA: Diagnosis not present

## 2020-05-15 DIAGNOSIS — R110 Nausea: Secondary | ICD-10-CM | POA: Diagnosis not present

## 2020-05-15 MED ORDER — DEXTROSE 5 % IV SOLN: INTRAVENOUS | Status: DC

## 2020-05-15 MED ORDER — IMMUNE GLOBULIN (HUMAN) 20 GM/200ML IV SOLN
40.0000 g | INTRAVENOUS | Status: DC
  Administered 2020-05-15: 40 g via INTRAVENOUS
  Filled 2020-05-15 (×2): qty 400

## 2020-05-15 MED ORDER — DIPHENHYDRAMINE HCL 25 MG PO CAPS
25.0000 mg | ORAL_CAPSULE | Freq: Once | ORAL | Status: AC
  Administered 2020-05-15: 25 mg via ORAL
  Filled 2020-05-15: qty 1

## 2020-05-15 MED ORDER — ACETAMINOPHEN 325 MG PO TABS
650.0000 mg | ORAL_TABLET | Freq: Once | ORAL | Status: AC
  Administered 2020-05-15: 650 mg via ORAL
  Filled 2020-05-15: qty 2

## 2020-05-15 NOTE — Progress Notes (Signed)
Pt here for D2 IVIG.   Tolerated treatment well today without incidence.  Discharged in stable condition ambulatory.  Vital signs stable prior to discharge.

## 2020-05-15 NOTE — Patient Instructions (Signed)
[facility] Discharge Instructions for Patients Receiving Chemotherapy  Today you received the following chemotherapy agents   To help prevent nausea and vomiting after your treatment, we encourage you to take your nausea medication   If you develop nausea and vomiting that is not controlled by your nausea medication, call the clinic.   BELOW ARE SYMPTOMS THAT SHOULD BE REPORTED IMMEDIATELY:  *FEVER GREATER THAN 100.5 F  *CHILLS WITH OR WITHOUT FEVER  NAUSEA AND VOMITING THAT IS NOT CONTROLLED WITH YOUR NAUSEA MEDICATION  *UNUSUAL SHORTNESS OF BREATH  *UNUSUAL BRUISING OR BLEEDING  TENDERNESS IN MOUTH AND THROAT WITH OR WITHOUT PRESENCE OF ULCERS  *URINARY PROBLEMS  *BOWEL PROBLEMS  UNUSUAL RASH Items with * indicate a potential emergency and should be followed up as soon as possible.  Feel free to call the clinic should you have any questions or concerns. The clinic phone number is [phone].  Please show the CHEMO ALERT CARD at check-in to the Emergency Department and triage nurse.   

## 2020-05-16 ENCOUNTER — Encounter (HOSPITAL_COMMUNITY): Payer: Self-pay

## 2020-05-16 ENCOUNTER — Inpatient Hospital Stay (HOSPITAL_COMMUNITY): Payer: Medicare Other

## 2020-05-16 ENCOUNTER — Other Ambulatory Visit: Payer: Self-pay

## 2020-05-16 VITALS — BP 127/75 | HR 70 | Temp 97.3°F | Resp 18

## 2020-05-16 DIAGNOSIS — Z85820 Personal history of malignant melanoma of skin: Secondary | ICD-10-CM | POA: Diagnosis not present

## 2020-05-16 DIAGNOSIS — Z808 Family history of malignant neoplasm of other organs or systems: Secondary | ICD-10-CM | POA: Diagnosis not present

## 2020-05-16 DIAGNOSIS — D72819 Decreased white blood cell count, unspecified: Secondary | ICD-10-CM | POA: Diagnosis not present

## 2020-05-16 DIAGNOSIS — R0602 Shortness of breath: Secondary | ICD-10-CM | POA: Diagnosis not present

## 2020-05-16 DIAGNOSIS — R110 Nausea: Secondary | ICD-10-CM | POA: Diagnosis not present

## 2020-05-16 DIAGNOSIS — Z79899 Other long term (current) drug therapy: Secondary | ICD-10-CM | POA: Diagnosis not present

## 2020-05-16 DIAGNOSIS — D696 Thrombocytopenia, unspecified: Secondary | ICD-10-CM | POA: Diagnosis not present

## 2020-05-16 DIAGNOSIS — K5900 Constipation, unspecified: Secondary | ICD-10-CM | POA: Diagnosis not present

## 2020-05-16 DIAGNOSIS — Z8249 Family history of ischemic heart disease and other diseases of the circulatory system: Secondary | ICD-10-CM | POA: Diagnosis not present

## 2020-05-16 DIAGNOSIS — D693 Immune thrombocytopenic purpura: Secondary | ICD-10-CM

## 2020-05-16 DIAGNOSIS — R61 Generalized hyperhidrosis: Secondary | ICD-10-CM | POA: Diagnosis not present

## 2020-05-16 DIAGNOSIS — M549 Dorsalgia, unspecified: Secondary | ICD-10-CM | POA: Diagnosis not present

## 2020-05-16 DIAGNOSIS — Z87891 Personal history of nicotine dependence: Secondary | ICD-10-CM | POA: Diagnosis not present

## 2020-05-16 DIAGNOSIS — R5383 Other fatigue: Secondary | ICD-10-CM | POA: Diagnosis not present

## 2020-05-16 MED ORDER — DIPHENHYDRAMINE HCL 25 MG PO CAPS
25.0000 mg | ORAL_CAPSULE | Freq: Once | ORAL | Status: AC
  Administered 2020-05-16: 25 mg via ORAL
  Filled 2020-05-16: qty 1

## 2020-05-16 MED ORDER — SODIUM CHLORIDE 0.9 % IV SOLN: INTRAVENOUS | Status: DC

## 2020-05-16 MED ORDER — DEXTROSE 5 % IV SOLN: INTRAVENOUS | Status: DC

## 2020-05-16 MED ORDER — IMMUNE GLOBULIN (HUMAN) 20 GM/200ML IV SOLN
40.0000 g | INTRAVENOUS | Status: DC
  Administered 2020-05-16: 40 g via INTRAVENOUS
  Filled 2020-05-16 (×2): qty 400

## 2020-05-16 MED ORDER — ACETAMINOPHEN 325 MG PO TABS
650.0000 mg | ORAL_TABLET | Freq: Once | ORAL | Status: AC
  Administered 2020-05-16: 650 mg via ORAL
  Filled 2020-05-16: qty 2

## 2020-05-16 NOTE — Patient Instructions (Signed)
[facility] at [facility]  Discharge Instructions:   _______________________________________________________________  Thank you for choosing [facility] at [facility] to provide your oncology and hematology care.  To afford each patient quality time with our providers, please arrive at least 15 minutes before your scheduled appointment.  You need to re-schedule your appointment if you arrive 10 or more minutes late.  We strive to give you quality time with our providers, and arriving late affects you and other patients whose appointments are after yours.  Also, if you no show three or more times for appointments you may be dismissed from the clinic.  Again, thank you for choosing [facility] at [facility]. Our hope is that these requests will allow you access to exceptional care and in a timely manner. _______________________________________________________________  If you have questions after your visit, please contact our office at [phone] between the hours of 8:30 a.m. and 5:00 p.m. Voicemails left after 4:30 p.m. will not be returned until the following business day. _______________________________________________________________  For prescription refill requests, have your pharmacy contact our office. _______________________________________________________________  Recommendations made by the consultant and any test results will be sent to your referring physician. _______________________________________________________________ 

## 2020-05-16 NOTE — Progress Notes (Signed)
Pt is here for D3 of IVIG. Tolerated treatment today without incidence.  Vital sings stable prior to discharge.  Discharged in stable condition ambulatory.

## 2020-05-17 ENCOUNTER — Inpatient Hospital Stay (HOSPITAL_COMMUNITY): Payer: Medicare Other

## 2020-05-17 VITALS — BP 129/78 | HR 69 | Temp 97.1°F | Resp 17

## 2020-05-17 DIAGNOSIS — R0602 Shortness of breath: Secondary | ICD-10-CM | POA: Diagnosis not present

## 2020-05-17 DIAGNOSIS — R61 Generalized hyperhidrosis: Secondary | ICD-10-CM | POA: Diagnosis not present

## 2020-05-17 DIAGNOSIS — D693 Immune thrombocytopenic purpura: Secondary | ICD-10-CM | POA: Diagnosis not present

## 2020-05-17 DIAGNOSIS — K5900 Constipation, unspecified: Secondary | ICD-10-CM | POA: Diagnosis not present

## 2020-05-17 DIAGNOSIS — Z79899 Other long term (current) drug therapy: Secondary | ICD-10-CM | POA: Diagnosis not present

## 2020-05-17 DIAGNOSIS — Z85820 Personal history of malignant melanoma of skin: Secondary | ICD-10-CM | POA: Diagnosis not present

## 2020-05-17 DIAGNOSIS — D696 Thrombocytopenia, unspecified: Secondary | ICD-10-CM | POA: Diagnosis not present

## 2020-05-17 DIAGNOSIS — Z808 Family history of malignant neoplasm of other organs or systems: Secondary | ICD-10-CM | POA: Diagnosis not present

## 2020-05-17 DIAGNOSIS — M549 Dorsalgia, unspecified: Secondary | ICD-10-CM | POA: Diagnosis not present

## 2020-05-17 DIAGNOSIS — Z8249 Family history of ischemic heart disease and other diseases of the circulatory system: Secondary | ICD-10-CM | POA: Diagnosis not present

## 2020-05-17 DIAGNOSIS — R5383 Other fatigue: Secondary | ICD-10-CM | POA: Diagnosis not present

## 2020-05-17 DIAGNOSIS — Z87891 Personal history of nicotine dependence: Secondary | ICD-10-CM | POA: Diagnosis not present

## 2020-05-17 DIAGNOSIS — R110 Nausea: Secondary | ICD-10-CM | POA: Diagnosis not present

## 2020-05-17 DIAGNOSIS — D72819 Decreased white blood cell count, unspecified: Secondary | ICD-10-CM | POA: Diagnosis not present

## 2020-05-17 MED ORDER — DEXTROSE 5 % IV SOLN: INTRAVENOUS | Status: DC

## 2020-05-17 MED ORDER — DIPHENHYDRAMINE HCL 25 MG PO CAPS: 25.0000 mg | ORAL_CAPSULE | Freq: Once | ORAL | Status: DC

## 2020-05-17 MED ORDER — IMMUNE GLOBULIN (HUMAN) 20 GM/200ML IV SOLN
40.0000 g | Freq: Once | INTRAVENOUS | Status: AC
  Administered 2020-05-17: 40 g via INTRAVENOUS
  Filled 2020-05-17: qty 400

## 2020-05-17 MED ORDER — SODIUM CHLORIDE 0.9% FLUSH: 10.0000 mL | Freq: Once | INTRAVENOUS | Status: DC | PRN

## 2020-05-17 MED ORDER — ACETAMINOPHEN 325 MG PO TABS: 650.0000 mg | ORAL_TABLET | Freq: Once | ORAL | Status: DC

## 2020-05-17 NOTE — Patient Instructions (Signed)
You received your 4th dose of IVIG today. We will see you tomorrow for the 5th and final dose this week.

## 2020-05-17 NOTE — Progress Notes (Signed)
Patient here today for IVIG D4 per MD orders.  He took 1300 mg tylenol and benadryl 25 mg at home around 0730.  He denies any side effects at this time.  Patient's IV is intact, good blood return and flushes without difficulty.  We will proceed with treatment today.    Patient discharged ambulatory and in stable condition from clinic by himself. He will follow up tomorrow as scheduled.

## 2020-05-18 ENCOUNTER — Other Ambulatory Visit: Payer: Self-pay

## 2020-05-18 ENCOUNTER — Inpatient Hospital Stay (HOSPITAL_COMMUNITY): Payer: Medicare Other

## 2020-05-18 VITALS — BP 110/68 | HR 58 | Temp 96.8°F | Resp 18

## 2020-05-18 DIAGNOSIS — R0602 Shortness of breath: Secondary | ICD-10-CM | POA: Diagnosis not present

## 2020-05-18 DIAGNOSIS — D693 Immune thrombocytopenic purpura: Secondary | ICD-10-CM

## 2020-05-18 DIAGNOSIS — Z8249 Family history of ischemic heart disease and other diseases of the circulatory system: Secondary | ICD-10-CM | POA: Diagnosis not present

## 2020-05-18 DIAGNOSIS — Z87891 Personal history of nicotine dependence: Secondary | ICD-10-CM | POA: Diagnosis not present

## 2020-05-18 DIAGNOSIS — R5383 Other fatigue: Secondary | ICD-10-CM | POA: Diagnosis not present

## 2020-05-18 DIAGNOSIS — Z808 Family history of malignant neoplasm of other organs or systems: Secondary | ICD-10-CM | POA: Diagnosis not present

## 2020-05-18 DIAGNOSIS — D696 Thrombocytopenia, unspecified: Secondary | ICD-10-CM | POA: Diagnosis not present

## 2020-05-18 DIAGNOSIS — D72819 Decreased white blood cell count, unspecified: Secondary | ICD-10-CM | POA: Diagnosis not present

## 2020-05-18 DIAGNOSIS — R61 Generalized hyperhidrosis: Secondary | ICD-10-CM | POA: Diagnosis not present

## 2020-05-18 DIAGNOSIS — R110 Nausea: Secondary | ICD-10-CM | POA: Diagnosis not present

## 2020-05-18 DIAGNOSIS — Z79899 Other long term (current) drug therapy: Secondary | ICD-10-CM | POA: Diagnosis not present

## 2020-05-18 DIAGNOSIS — M549 Dorsalgia, unspecified: Secondary | ICD-10-CM | POA: Diagnosis not present

## 2020-05-18 DIAGNOSIS — K5900 Constipation, unspecified: Secondary | ICD-10-CM | POA: Diagnosis not present

## 2020-05-18 DIAGNOSIS — Z85820 Personal history of malignant melanoma of skin: Secondary | ICD-10-CM | POA: Diagnosis not present

## 2020-05-18 MED ORDER — IMMUNE GLOBULIN (HUMAN) 20 GM/200ML IV SOLN
40.0000 g | INTRAVENOUS | Status: DC
  Administered 2020-05-18: 40 g via INTRAVENOUS
  Filled 2020-05-18 (×2): qty 400

## 2020-05-18 MED ORDER — DEXTROSE 5 % IV SOLN: Freq: Once | INTRAVENOUS | Status: AC

## 2020-05-18 MED ORDER — DIPHENHYDRAMINE HCL 25 MG PO CAPS: 25.0000 mg | ORAL_CAPSULE | Freq: Once | ORAL | Status: DC

## 2020-05-18 MED ORDER — ACETAMINOPHEN 325 MG PO TABS: 650.0000 mg | ORAL_TABLET | Freq: Once | ORAL | Status: DC

## 2020-05-18 NOTE — Patient Instructions (Signed)
[facility] Discharge Instructions for Patients   Today you received the following chemotherapy agents   To help prevent nausea and vomiting after your treatment, we encourage you to take your nausea medication    If you develop nausea and vomiting that is not controlled by your nausea medication, call the clinic.   BELOW ARE SYMPTOMS THAT SHOULD BE REPORTED IMMEDIATELY:  *FEVER GREATER THAN 100.5 F  *CHILLS WITH OR WITHOUT FEVER  NAUSEA AND VOMITING THAT IS NOT CONTROLLED WITH YOUR NAUSEA MEDICATION  *UNUSUAL SHORTNESS OF BREATH  *UNUSUAL BRUISING OR BLEEDING  TENDERNESS IN MOUTH AND THROAT WITH OR WITHOUT PRESENCE OF ULCERS  *URINARY PROBLEMS  *BOWEL PROBLEMS  UNUSUAL RASH Items with * indicate a potential emergency and should be followed up as soon as possible.  Feel free to call the clinic should you have any questions or concerns. The clinic phone number is [phone].  Please show the CHEMO ALERT CARD at check-in to the Emergency Department and triage nurse.   

## 2020-05-18 NOTE — Progress Notes (Signed)
Patient presents today for IVIG.  Vitals signs stable. No new complaints since last visit.  IVIG infusion given today per MD orders.  Tolerated infusion without adverse affects.  Vital signs stable.  No complaints at this time.  Discharge from clinic ambulatory in stable condition.  Alert and oriented X 3.  Follow up with [facility] as scheduled.

## 2020-05-21 DIAGNOSIS — G4733 Obstructive sleep apnea (adult) (pediatric): Secondary | ICD-10-CM | POA: Diagnosis not present

## 2020-05-22 ENCOUNTER — Inpatient Hospital Stay (HOSPITAL_COMMUNITY): Payer: Medicare Other

## 2020-05-22 ENCOUNTER — Other Ambulatory Visit (HOSPITAL_COMMUNITY): Payer: Medicare Other

## 2020-05-22 ENCOUNTER — Encounter (HOSPITAL_COMMUNITY): Payer: Self-pay | Admitting: Hematology and Oncology

## 2020-05-22 ENCOUNTER — Inpatient Hospital Stay (HOSPITAL_COMMUNITY): Payer: Medicare Other | Admitting: Hematology and Oncology

## 2020-05-22 ENCOUNTER — Ambulatory Visit (INDEPENDENT_AMBULATORY_CARE_PROVIDER_SITE_OTHER): Payer: Medicare Other | Admitting: Internal Medicine

## 2020-05-22 ENCOUNTER — Other Ambulatory Visit: Payer: Self-pay

## 2020-05-22 VITALS — BP 134/85 | HR 95 | Temp 96.9°F | Resp 18 | Wt 255.3 lb

## 2020-05-22 DIAGNOSIS — D709 Neutropenia, unspecified: Secondary | ICD-10-CM | POA: Diagnosis not present

## 2020-05-22 DIAGNOSIS — R0602 Shortness of breath: Secondary | ICD-10-CM | POA: Diagnosis not present

## 2020-05-22 DIAGNOSIS — D696 Thrombocytopenia, unspecified: Secondary | ICD-10-CM

## 2020-05-22 DIAGNOSIS — K5900 Constipation, unspecified: Secondary | ICD-10-CM | POA: Diagnosis not present

## 2020-05-22 DIAGNOSIS — Z8249 Family history of ischemic heart disease and other diseases of the circulatory system: Secondary | ICD-10-CM | POA: Diagnosis not present

## 2020-05-22 DIAGNOSIS — R61 Generalized hyperhidrosis: Secondary | ICD-10-CM | POA: Diagnosis not present

## 2020-05-22 DIAGNOSIS — D693 Immune thrombocytopenic purpura: Secondary | ICD-10-CM | POA: Diagnosis not present

## 2020-05-22 DIAGNOSIS — M549 Dorsalgia, unspecified: Secondary | ICD-10-CM | POA: Diagnosis not present

## 2020-05-22 DIAGNOSIS — Z79899 Other long term (current) drug therapy: Secondary | ICD-10-CM | POA: Diagnosis not present

## 2020-05-22 DIAGNOSIS — Z85820 Personal history of malignant melanoma of skin: Secondary | ICD-10-CM | POA: Diagnosis not present

## 2020-05-22 DIAGNOSIS — D72819 Decreased white blood cell count, unspecified: Secondary | ICD-10-CM | POA: Diagnosis not present

## 2020-05-22 DIAGNOSIS — Z808 Family history of malignant neoplasm of other organs or systems: Secondary | ICD-10-CM | POA: Diagnosis not present

## 2020-05-22 DIAGNOSIS — R5383 Other fatigue: Secondary | ICD-10-CM | POA: Diagnosis not present

## 2020-05-22 DIAGNOSIS — Z87891 Personal history of nicotine dependence: Secondary | ICD-10-CM | POA: Diagnosis not present

## 2020-05-22 DIAGNOSIS — R110 Nausea: Secondary | ICD-10-CM | POA: Diagnosis not present

## 2020-05-22 LAB — COMPREHENSIVE METABOLIC PANEL
ALT: 42 U/L (ref 0–44)
AST: 61 U/L — ABNORMAL HIGH (ref 15–41)
Albumin: 3.7 g/dL (ref 3.5–5.0)
Alkaline Phosphatase: 58 U/L (ref 38–126)
Anion gap: 9 (ref 5–15)
BUN: 22 mg/dL (ref 8–23)
CO2: 21 mmol/L — ABNORMAL LOW (ref 22–32)
Calcium: 8.9 mg/dL (ref 8.9–10.3)
Chloride: 105 mmol/L (ref 98–111)
Creatinine, Ser: 1.13 mg/dL (ref 0.61–1.24)
GFR, Estimated: >60 mL/min (ref >60)
Glucose, Bld: 123 mg/dL — ABNORMAL HIGH (ref 70–99)
Potassium: 3.5 mmol/L (ref 3.5–5.1)
Sodium: 135 mmol/L (ref 135–145)
Total Bilirubin: 1 mg/dL (ref 0.3–1.2)
Total Protein: 8 g/dL (ref 6.5–8.1)

## 2020-05-22 LAB — LACTATE DEHYDROGENASE: LDH: 198 U/L — ABNORMAL HIGH (ref 98–192)

## 2020-05-22 MED ORDER — PREDNISONE 20 MG PO TABS: 60.0000 mg | ORAL_TABLET | Freq: Every day | ORAL | 0 refills | Status: DC

## 2020-05-22 MED ORDER — PREDNISONE 50 MG PO TABS: 60.0000 mg | ORAL_TABLET | Freq: Every day | ORAL | Status: DC

## 2020-05-22 NOTE — Progress Notes (Signed)
[name], [place] [address]   CLINIC:  Medical Oncology/Hematology  PCP:  [facility], [place] / [place] [address]  [phone]  REASON FOR VISIT:  Follow-up for thrombocytopenia  PRIOR THERAPY: None  CURRENT THERAPY: Prednisone, IVIG   History  Seen as referral from Dr. Nolon Rod office for decreased platelet count of 80 on CBC on 10/21/2018. -Ex-smoker, quit in 1982, smoked 1 to 2 packs/day for 10 years.  He also spent 2 years at [facility]. -CT abdomen on 11/08/2018 showed normal size spleen with no abdominal or pelvic pathology. -BMBX on 03/01/2019 showed slightly hypercellular marrow for age with trilineage hematopoiesis including abundant megakaryocytes and predominantly normal morphology.  Overall myeloid changes are nonspecific and not diagnostic for MDS.  Chromosome analysis was normal.  MDS FISH panel was normal.  Core biopsy showed small lymphoid aggregates, features are not considered specific or diagnostic of lymphoproliferative disorder. He has recently received IVIG for ITP, transient response to dex.  INTERVAL HISTORY:  Mr. TREYDON HENRICKS, a 69 y.o. male, returns for routine follow-up for his thrombocytopenia. He is worried that his platelets and WBC count are very low. No active bleeding. He complains of fatigue, excessive sweating and SOB. He has no appetite but hasnt lost much weight. He tells me that he felt very tired after the IVIG. No changes in breathing, hematochezia, melena or hematuria.  REVIEW OF SYSTEMS:  Review of Systems  Constitutional: Positive for appetite change, diaphoresis and fatigue. Negative for chills and fever.  Respiratory: Positive for shortness of breath.   Gastrointestinal: Positive for constipation and nausea.  Musculoskeletal: Positive for back pain (4/10 lower back pain).  Hematological: Bruises/bleeds easily.  All other systems reviewed and are negative.   PAST  MEDICAL/SURGICAL HISTORY:  Past Medical History:  Diagnosis Date  . Anxiety    patient denies  . Aortic aneurysm (HCC)    ascending  . Arthritis   . Barrett esophagus   . Blood transfusion   . Cancer ([place]) 2007   melanoma stage 3 stomach bx  . Coronary artery disease    mild by 09/08/13 cath HPR  . GERD (gastroesophageal reflux disease)    barretts esophagus  . H/O hiatal hernia   . Headache(784.0)   . Hypercholesteremia   . Hypertension   . PONV (postoperative nausea and vomiting)    trouble breathing after last surgery (at [facility])  . Shingles    10  . Sleep apnea    not tested  . Tumor liver    no problems   Past Surgical History:  Procedure Laterality Date  . ABDOMINAL SURGERY     x3  . back surgeries     x 9  . BACK SURGERY     x8  . BIOPSY N/A 03/28/2013   Procedure: Distal Esophageal Biopsy;  Surgeon: Rogene Houston, MD;  Location: [facility];  Service: Endoscopy;  Laterality: N/A;  . BIOPSY  05/30/2016   Procedure: BIOPSY;  Surgeon: Rogene Houston, MD;  Location: [facility];  Service: Endoscopy;;  . BIOPSY  02/19/2018   Procedure: BIOPSY;  Surgeon: Rogene Houston, MD;  Location: [facility];  Service: Endoscopy;;  Barret's esophagus  . CHEST SURGERY     lung incisional hernia for hernia  . COLONOSCOPY WITH PROPOFOL N/A 02/19/2018   Procedure: COLONOSCOPY WITH PROPOFOL;  Surgeon: Rogene Houston, MD;  Location: [facility];  Service: Endoscopy;  Laterality: N/A;  1:00  . ESOPHAGOGASTRODUODENOSCOPY (EGD) WITH PROPOFOL N/A 03/28/2013   Procedure: ESOPHAGOGASTRODUODENOSCOPY (EGD) WITH PROPOFOL;  Surgeon: Rogene Houston, MD;  Location: [facility];  Service: Endoscopy;  Laterality: N/A;  GE junction @ 38, proximal  margin @37   . ESOPHAGOGASTRODUODENOSCOPY (EGD) WITH PROPOFOL N/A 05/30/2016   Procedure: ESOPHAGOGASTRODUODENOSCOPY (EGD) WITH PROPOFOL;  Surgeon: Rogene Houston, MD;  [facility];  Service: Endoscopy;  Laterality: N/A;  955  .  ESOPHAGOGASTRODUODENOSCOPY (EGD) WITH PROPOFOL N/A 02/19/2018   Procedure: ESOPHAGOGASTRODUODENOSCOPY (EGD) WITH PROPOFOL;  Surgeon: Rogene Houston, MD;  [facility];  Service: Endoscopy;  Laterality: N/A;  . HEMORRHOID SURGERY     x2  . HERNIA REPAIR    . HIATAL HERNIA REPAIR    . JOINT REPLACEMENT     lft knee  . KNEE ARTHROPLASTY     lft  . KNEE ARTHROSCOPY Right   . LEG SURGERY     x4 crushed   . LITHOTRIPSY    . MELANOMA EXCISION     lft abdomen  . NECK SURGERY  2012  . POLYPECTOMY  02/19/2018   Procedure: POLYPECTOMY;  Surgeon: Rogene Houston, MD;  [facility];  Service: Endoscopy;;  proximal transverse colon (CB x1), descending colon (CBx3)  . POSTERIOR CERVICAL FUSION/FORAMINOTOMY  08/18/2011   Procedure: POSTERIOR CERVICAL FUSION/FORAMINOTOMY LEVEL 3;  Surgeon: Ophelia Charter, MD;  Location: [place] [facility];  Service: Neurosurgery;  Laterality: N/A;  Cervical three-four, four-five, five-six posterior cervical  fusion with instrumentation; Right cervical three-four, four-five laminotomy  . REVISION TOTAL HIP ARTHROPLASTY Left   . SHOULDER ARTHROSCOPY     rt bith defect , spur, rotator cuff  . SPINAL CORD STIMULATOR IMPLANT     rt hip  . SPINAL CORD STIMULATOR INSERTION N/A 11/03/2016   Procedure: LUMBAR SPINAL CORD STIMULATOR REVISION;  Surgeon: Newman Pies, MD;  Location: [place];  Service: Neurosurgery;  Laterality: N/A;  . THYROID LOBECTOMY     rt    SOCIAL HISTORY:  Social History   Socioeconomic History  . Marital status: Married    Spouse name: Not on file  . Number of children: Not on file  . Years of education: Not on file  . Highest education level: Not on file  Occupational History  . Occupation: retired  Tobacco Use  . Smoking status: Former Smoker    Packs/day: 4.00    Years: 10.00    Pack years: 40.00    Types: Cigarettes    Quit date: 08/12/1980    Years since quitting: 39.8  . Smokeless tobacco: Never Used  . Tobacco  comment: occ wine  Vaping Use  . Vaping Use: Never used  Substance and Sexual Activity  . Alcohol use: Yes    Alcohol/week: 0.0 standard drinks    Comment: rarely  . Drug use: No  . Sexual activity: Yes    Birth control/protection: None  Other Topics Concern  . Not on file  Social History Narrative  . Not on file   Social Determinants of Health   Financial Resource Strain: Low Risk   . Difficulty of Paying Living Expenses: Not hard at all  Food Insecurity: No Food Insecurity  . Worried About Charity fundraiser in the Last Year: Never true  . Ran Out of Food in the Last Year: Never true  Transportation Needs: No Transportation Needs  . Lack of Transportation (Medical): No  . Lack of Transportation (Non-Medical): No  Physical Activity: Inactive  .  Days of Exercise per Week: 0 days  . Minutes of Exercise per Session: 0 min  Stress: No Stress Concern Present  . Feeling of Stress : Not at all  Social Connections: Moderately Integrated  . Frequency of Communication with Friends and Family: More than three times a week  . Frequency of Social Gatherings with Friends and Family: Once a week  . Attends Religious Services: More than 4 times per year  . Active Member of Clubs or Organizations: No  . Attends Archivist Meetings: Never  . Marital Status: Married  Human resources officer Violence: Not At Risk  . Fear of Current or Ex-Partner: No  . Emotionally Abused: No  . Physically Abused: No  . Sexually Abused: No    FAMILY HISTORY:  Family History  Problem Relation Age of Onset  . Heart disease Father   . Aneurysm Mother   . Aneurysm Sister   . Heart disease Paternal Grandmother   . Heart disease Paternal Grandfather   . Heart disease Maternal Grandfather   . Heart disease Brother        heart transplant  . Aneurysm Other        pt states several members have aortic aneurysm  . Skin cancer Other        several members  . Lymphoma Brother   . Aneurysm Maternal  Grandmother     CURRENT MEDICATIONS:  Current Outpatient Medications  Medication Sig Dispense Refill  . Aspirin-Acetaminophen-Caffeine (GOODY HEADACHE PO) Take 1-2 packets by mouth daily as needed (headaches).    Marland Kitchen Bioflavonoid Products (ESTER C PO) Take 1 tablet by mouth daily.     Marland Kitchen Bioflavonoid Products (ESTER-C) 500-550 MG TABS Take 1 tablet by mouth daily.    . calcium-vitamin D (OSCAL WITH D) 500-200 MG-UNIT per tablet Take 1 tablet by mouth 2 (two) times daily.     . clopidogrel (PLAVIX) 75 MG tablet TAKE 1 TABLET BY MOUTH ONCE DAILY START DATE 9 10 2020    . cyclobenzaprine (FLEXERIL) 10 MG tablet Take 1 tablet (10 mg total) by mouth 3 (three) times daily as needed for muscle spasms. 50 tablet 1  . dexlansoprazole (DEXILANT) 60 MG capsule Take 60 mg by mouth daily.     . diazepam (VALIUM) 10 MG tablet Take 10 mg by mouth 4 (four) times daily as needed (for back pain).     . fexofenadine (ALLEGRA) 180 MG tablet Take 180 mg by mouth every evening.    . fexofenadine (ALLEGRA) 180 MG tablet Take by mouth.    . fish oil-omega-3 fatty acids 1000 MG capsule Take 1 g by mouth 2 (two) times daily.    Marland Kitchen lisinopril (PRINIVIL,ZESTRIL) 10 MG tablet Take 10 mg by mouth daily.    . metoCLOPramide (REGLAN) 10 MG tablet TAKE ONE TABLET BY MOUTH TWICE A DAY AS NEEDED FOR NAUSEA 60 tablet 2  . montelukast (SINGULAIR) 10 MG tablet Take 10 mg by mouth daily.     . Multiple Vitamin (MULITIVITAMIN WITH MINERALS) TABS Take 1 tablet by mouth daily.    . ondansetron (ZOFRAN) 4 MG tablet Take 1 tablet (4 mg total) by mouth every 8 (eight) hours as needed for nausea or vomiting. 6 tablet 0  . oxyCODONE-acetaminophen (PERCOCET) 10-325 MG tablet Take 1 tablet by mouth every 6 (six) hours as needed for pain.    . pregabalin (LYRICA) 150 MG capsule Take by mouth.    . ranolazine (RANEXA) 500 MG 12 hr tablet Take 500  mg by mouth 2 (two) times daily.    . rosuvastatin (CRESTOR) 10 MG tablet Take 10 mg by mouth every  evening.     . sildenafil (VIAGRA) 100 MG tablet TAKE ONE-HALF TABLET BY MOUTH AS INSTRUCTED (TAKE 1 HOUR PRIOR TO SEXUAL ACTIVITY *DO NOT EXCEED 1 DOSE PER 24 HOUR PERIOD*)     No current facility-administered medications for this visit.    ALLERGIES:  Allergies  Allergen Reactions  . Demerol Other (See Comments)    "Makes me crazy."  . Contrast Media [Iodinated Diagnostic Agents] Nausea Only    PHYSICAL EXAM:  Performance status (ECOG): 1 - Symptomatic but completely ambulatory  Vitals:   05/22/20 1320  BP: 134/85  Pulse: 95  Resp: 18  Temp: (!) 96.9 F (36.1 C)  SpO2: 96%   Wt Readings from Last 3 Encounters:  05/22/20 255 lb 4.7 oz (115.8 kg)  05/14/20 260 lb (117.9 kg)  05/09/20 251 lb (113.9 kg)    Physical Exam Vitals reviewed.  Constitutional:      Appearance: Normal appearance. He is obese.  Cardiovascular:     Rate and Rhythm: Normal rate and regular rhythm.     Pulses: Normal pulses.     Heart sounds: Normal heart sounds.  Pulmonary:     Effort: Pulmonary effort is normal.     Breath sounds: Normal breath sounds.  Abdominal:     Palpations: Abdomen is soft. There is no mass.     Tenderness: There is no abdominal tenderness.  Musculoskeletal:     Right lower leg: No edema.     Left lower leg: No edema.  Neurological:     General: No focal deficit present.     Mental Status: He is alert and oriented to person, place, and time.  Psychiatric:        Mood and Affect: Mood normal.        Behavior: Behavior normal.    LABORATORY DATA:  I have reviewed the labs as listed.  CBC Latest Ref Rng & Units 05/22/2020 05/09/2020 02/07/2020  WBC 4.0 - 10.5 K/uL 1.1(LL) 2.1(L) 4.0  Hemoglobin 13.0 - 17.0 g/dL 11.9(L) 13.5 13.9  Hematocrit 39.0 - 52.0 % 36.4(L) 41.4 43.2  Platelets 150 - 400 K/uL 28(LL) 49(L) 72(L)   CMP Latest Ref Rng & Units 05/22/2020 05/09/2020 01/18/2020  Glucose 70 - 99 mg/dL 123(H) 138(H) 115(H)  BUN 8 - 23 mg/dL 22 23 16   Creatinine 0.61  - 1.24 mg/dL 1.13 1.46(H) 1.31(H)  Sodium 135 - 145 mmol/L 135 137 137  Potassium 3.5 - 5.1 mmol/L 3.5 3.8 4.3  Chloride 98 - 111 mmol/L 105 105 106  CO2 22 - 32 mmol/L 21(L) 23 23  Calcium 8.9 - 10.3 mg/dL 8.9 9.2 9.1  Total Protein 6.5 - 8.1 g/dL 8.0 6.7 7.1  Total Bilirubin 0.3 - 1.2 mg/dL 1.0 0.8 0.8  Alkaline Phos 38 - 126 U/L 58 67 59  AST 15 - 41 U/L 61(H) 27 27  ALT 0 - 44 U/L 42 24 22      Component Value Date/Time   RBC 3.99 (L) 05/22/2020 1254   MCV 91.2 05/22/2020 1254   MCH 29.8 05/22/2020 1254   MCHC 32.7 05/22/2020 1254   RDW 13.1 05/22/2020 1254   LYMPHSABS 0.5 (L) 05/22/2020 1254   MONOABS 0.2 05/22/2020 1254   EOSABS 0.0 05/22/2020 1254   BASOSABS 0.0 05/22/2020 1254   Lab Results  Component Value Date   LDH 198 (H) 05/22/2020  LDH 138 05/09/2020   LDH 162 01/18/2020   DIAGNOSTIC IMAGING:  I have independently reviewed the scans and discussed with the patient. No results found.   ASSESSMENT:   1.  Moderate thrombocytopenia:  -Seen as referral from Dr. Nolon Rod office for decreased platelet count of 80 on CBC on 10/21/2018. -Ex-smoker, quit in 1982, smoked 1 to 2 packs/day for 10 years.  He also spent 2 years at [facility]. -CT abdomen on 11/08/2018 showed normal size spleen with no abdominal or pelvic pathology. -BMBX on 03/01/2019 showed slightly hypercellular marrow for age with trilineage hematopoiesis including abundant megakaryocytes and predominantly normal morphology.  Overall myeloid changes are nonspecific and not diagnostic for MDS.  Chromosome analysis was normal.  MDS FISH panel was normal.  Core biopsy showed small lymphoid aggregates, features are not considered specific or diagnostic of lymphoproliferative disorder. He continues to have worsening thrombocytopenia, no response to IVIG.  2.  Malignant melanoma: -Melanoma resected on the left side of the abdomen with left axillary sentinel lymph node biopsy at [facility] 14 years ago.  Reportedly  stage III.   PLAN:   1.  Moderate thrombocytopenia and severe neutropenia. -Platelet count continuously dropping for the last 1 year.  Today CBC shows platelet count of 28K -Also noted severe leukopenia and neutropenia. Given his worsening B symptoms, recommended repeat imaging with PET to evaluate for a possible lymphoma I told him that he may need repeat marrow if necessary In the interim, CBC thrice weekly, prednisone 60 mg daily, FU in one week. He understands the risk of spontaneous internal bleed with severe thrombocytopenia. He is agree able to the above mentioned plan.  3.  Malignant melanoma: -No evidence of lymphadenopathy or recurrence at this time.  LFTs are normal.  LDH was normal.  Orders placed this encounter:  No orders of the defined types were placed in this encounter.

## 2020-05-23 LAB — CBC WITH DIFFERENTIAL/PLATELET
Abs Immature Granulocytes: 0.01 10*3/uL (ref 0.00–0.07)
Basophils Absolute: 0 10*3/uL (ref 0.0–0.1)
Basophils Relative: 0 %
Eosinophils Absolute: 0 10*3/uL (ref 0.0–0.5)
Eosinophils Relative: 0 %
HCT: 36.4 % — ABNORMAL LOW (ref 39.0–52.0)
Hemoglobin: 11.9 g/dL — ABNORMAL LOW (ref 13.0–17.0)
Immature Granulocytes: 1 %
Lymphocytes Relative: 48 %
Lymphs Abs: 0.5 10*3/uL — ABNORMAL LOW (ref 0.7–4.0)
MCH: 29.8 pg (ref 26.0–34.0)
MCHC: 32.7 g/dL (ref 30.0–36.0)
MCV: 91.2 fL (ref 80.0–100.0)
Monocytes Absolute: 0.2 10*3/uL (ref 0.1–1.0)
Monocytes Relative: 20 %
Neutro Abs: 0.3 10*3/uL — CL (ref 1.7–7.7)
Neutrophils Relative %: 31 %
Platelets: 28 10*3/uL — CL (ref 150–400)
RBC: 3.99 MIL/uL — ABNORMAL LOW (ref 4.22–5.81)
RDW: 13.1 % (ref 11.5–15.5)
WBC: 1.1 10*3/uL — CL (ref 4.0–10.5)
nRBC: 0 % (ref 0.0–0.2)

## 2020-05-24 ENCOUNTER — Telehealth (HOSPITAL_COMMUNITY): Payer: Self-pay | Admitting: *Deleted

## 2020-05-24 ENCOUNTER — Encounter (HOSPITAL_COMMUNITY): Payer: Self-pay

## 2020-05-24 ENCOUNTER — Inpatient Hospital Stay (HOSPITAL_COMMUNITY): Payer: Medicare Other

## 2020-05-24 ENCOUNTER — Other Ambulatory Visit: Payer: Self-pay

## 2020-05-24 DIAGNOSIS — D696 Thrombocytopenia, unspecified: Secondary | ICD-10-CM

## 2020-05-24 DIAGNOSIS — R61 Generalized hyperhidrosis: Secondary | ICD-10-CM | POA: Diagnosis not present

## 2020-05-24 DIAGNOSIS — R0602 Shortness of breath: Secondary | ICD-10-CM | POA: Diagnosis not present

## 2020-05-24 DIAGNOSIS — M549 Dorsalgia, unspecified: Secondary | ICD-10-CM | POA: Diagnosis not present

## 2020-05-24 DIAGNOSIS — Z87891 Personal history of nicotine dependence: Secondary | ICD-10-CM | POA: Diagnosis not present

## 2020-05-24 DIAGNOSIS — D693 Immune thrombocytopenic purpura: Secondary | ICD-10-CM | POA: Diagnosis not present

## 2020-05-24 DIAGNOSIS — Z808 Family history of malignant neoplasm of other organs or systems: Secondary | ICD-10-CM | POA: Diagnosis not present

## 2020-05-24 DIAGNOSIS — R5383 Other fatigue: Secondary | ICD-10-CM | POA: Diagnosis not present

## 2020-05-24 DIAGNOSIS — Z79899 Other long term (current) drug therapy: Secondary | ICD-10-CM | POA: Diagnosis not present

## 2020-05-24 DIAGNOSIS — K5900 Constipation, unspecified: Secondary | ICD-10-CM | POA: Diagnosis not present

## 2020-05-24 DIAGNOSIS — Z85820 Personal history of malignant melanoma of skin: Secondary | ICD-10-CM | POA: Diagnosis not present

## 2020-05-24 DIAGNOSIS — D72819 Decreased white blood cell count, unspecified: Secondary | ICD-10-CM | POA: Diagnosis not present

## 2020-05-24 DIAGNOSIS — R110 Nausea: Secondary | ICD-10-CM | POA: Diagnosis not present

## 2020-05-24 DIAGNOSIS — D709 Neutropenia, unspecified: Secondary | ICD-10-CM

## 2020-05-24 DIAGNOSIS — Z8249 Family history of ischemic heart disease and other diseases of the circulatory system: Secondary | ICD-10-CM | POA: Diagnosis not present

## 2020-05-24 LAB — CBC WITH DIFFERENTIAL/PLATELET
Abs Immature Granulocytes: 0.01 10*3/uL (ref 0.00–0.07)
Basophils Absolute: 0 10*3/uL (ref 0.0–0.1)
Basophils Relative: 1 %
Eosinophils Absolute: 0 10*3/uL (ref 0.0–0.5)
Eosinophils Relative: 0 %
HCT: 38.2 % — ABNORMAL LOW (ref 39.0–52.0)
Hemoglobin: 12.5 g/dL — ABNORMAL LOW (ref 13.0–17.0)
Immature Granulocytes: 1 %
Lymphocytes Relative: 50 %
Lymphs Abs: 0.7 10*3/uL (ref 0.7–4.0)
MCH: 30 pg (ref 26.0–34.0)
MCHC: 32.7 g/dL (ref 30.0–36.0)
MCV: 91.8 fL (ref 80.0–100.0)
Monocytes Absolute: 0.2 10*3/uL (ref 0.1–1.0)
Monocytes Relative: 13 %
Neutro Abs: 0.5 10*3/uL — ABNORMAL LOW (ref 1.7–7.7)
Neutrophils Relative %: 35 %
Platelets: 34 10*3/uL — ABNORMAL LOW (ref 150–400)
RBC: 4.16 MIL/uL — ABNORMAL LOW (ref 4.22–5.81)
RDW: 13.1 % (ref 11.5–15.5)
WBC: 1.3 10*3/uL — CL (ref 4.0–10.5)
nRBC: 0 % (ref 0.0–0.2)

## 2020-05-24 NOTE — Progress Notes (Signed)
Patient presented today for lab appointment. And waited for lab results.  Informed patient that his WBC> 1.3, RBC> 4.16, Hgb>12.5 and Platelets>34.  Informed patient that there are critical levels however, they are higher then the last labs.    Informed patient to make sure to use Neutropenic Precautions, educated patient on these precautions, also advised to minimize his person to person contact. If new symptoms occur to come to ER for evaluation.   Patient acknowledged understanding.  No further questions or concerns at this time.

## 2020-05-24 NOTE — Telephone Encounter (Signed)
.  CRITICAL VALUE ALERT Critical value received:  WBC 1.3 Date of notification:  05/24/20 Time of notification: 1132 Critical value read back:  WBC 1.3 Nurse who received alert:  M. Cordelro Gautreau, LPN MD notified time and response: Dr. Chryl Heck 1135 pt is placed on neutropenic precautions. Will continue to monitor.

## 2020-05-28 ENCOUNTER — Telehealth (HOSPITAL_COMMUNITY): Payer: Self-pay | Admitting: Surgery

## 2020-05-28 ENCOUNTER — Other Ambulatory Visit (HOSPITAL_COMMUNITY): Payer: Self-pay | Admitting: Surgery

## 2020-05-28 DIAGNOSIS — D709 Neutropenia, unspecified: Secondary | ICD-10-CM

## 2020-05-28 DIAGNOSIS — D696 Thrombocytopenia, unspecified: Secondary | ICD-10-CM

## 2020-05-28 DIAGNOSIS — D693 Immune thrombocytopenic purpura: Secondary | ICD-10-CM

## 2020-05-28 MED ORDER — ONDANSETRON HCL 8 MG PO TABS: 8.0000 mg | ORAL_TABLET | Freq: Three times a day (TID) | ORAL | 3 refills | Status: DC | PRN

## 2020-05-28 NOTE — Telephone Encounter (Signed)
Pt's wife had left a voicemail stating that her husband was continuing to have nausea and also starting to have leg cramps.  Pt takes Reglan twice a day.  Dr. Ellin Saba notified of the pt's nausea and cramps.  Dr. Ellin Saba ordered labs to be drawn tomorrow (BMP and Magnesium), and he ordered Zofran 8 mg every 8 hours prn.    I called the pt's wife back to let her know about the lab appointment tomorrow at 10:10, and that we would be checking her husband's magnesium levels to see if oral magnesium supplements are needed.  Also, I told her about the increased dose of Zofran that was being called in.  I later spoke to the pt to let him know that he could try mustard for the leg cramps, and that the increased dose of Zofran could cause constipation.  The pt stated that he already takes 2 stool softeners.  I told the pt to call our office back if his constipation gets any worse, and if the nausea medication does not help.  He verbalized understanding of these instructions, and that someone from our office would call with the results of his blood work.

## 2020-05-28 NOTE — Progress Notes (Signed)
CRITICAL VALUE ALERT  Critical Value:  WBC 1.1; platelets 28  Date & Time Notied:  05/22/2020 at 1335  Provider Notified: Dr. Al Pimple  Orders Received/Actions taken: pt seen and examined today by Dr. Al Pimple - see progress note for plan and recommendations.

## 2020-05-29 ENCOUNTER — Telehealth (HOSPITAL_COMMUNITY): Payer: Self-pay | Admitting: Surgery

## 2020-05-29 ENCOUNTER — Other Ambulatory Visit: Payer: Self-pay

## 2020-05-29 ENCOUNTER — Inpatient Hospital Stay (HOSPITAL_COMMUNITY): Payer: Medicare Other

## 2020-05-29 DIAGNOSIS — D693 Immune thrombocytopenic purpura: Secondary | ICD-10-CM | POA: Diagnosis not present

## 2020-05-29 DIAGNOSIS — Z87891 Personal history of nicotine dependence: Secondary | ICD-10-CM | POA: Diagnosis not present

## 2020-05-29 DIAGNOSIS — Z85820 Personal history of malignant melanoma of skin: Secondary | ICD-10-CM | POA: Diagnosis not present

## 2020-05-29 DIAGNOSIS — R0602 Shortness of breath: Secondary | ICD-10-CM | POA: Diagnosis not present

## 2020-05-29 DIAGNOSIS — D709 Neutropenia, unspecified: Secondary | ICD-10-CM

## 2020-05-29 DIAGNOSIS — R5383 Other fatigue: Secondary | ICD-10-CM | POA: Diagnosis not present

## 2020-05-29 DIAGNOSIS — D696 Thrombocytopenia, unspecified: Secondary | ICD-10-CM | POA: Diagnosis not present

## 2020-05-29 DIAGNOSIS — M549 Dorsalgia, unspecified: Secondary | ICD-10-CM | POA: Diagnosis not present

## 2020-05-29 DIAGNOSIS — Z808 Family history of malignant neoplasm of other organs or systems: Secondary | ICD-10-CM | POA: Diagnosis not present

## 2020-05-29 DIAGNOSIS — D72819 Decreased white blood cell count, unspecified: Secondary | ICD-10-CM | POA: Diagnosis not present

## 2020-05-29 DIAGNOSIS — R61 Generalized hyperhidrosis: Secondary | ICD-10-CM | POA: Diagnosis not present

## 2020-05-29 DIAGNOSIS — Z79899 Other long term (current) drug therapy: Secondary | ICD-10-CM | POA: Diagnosis not present

## 2020-05-29 DIAGNOSIS — K5900 Constipation, unspecified: Secondary | ICD-10-CM | POA: Diagnosis not present

## 2020-05-29 DIAGNOSIS — R110 Nausea: Secondary | ICD-10-CM | POA: Diagnosis not present

## 2020-05-29 DIAGNOSIS — Z8249 Family history of ischemic heart disease and other diseases of the circulatory system: Secondary | ICD-10-CM | POA: Diagnosis not present

## 2020-05-29 LAB — CBC WITH DIFFERENTIAL/PLATELET
Abs Immature Granulocytes: 0.02 10*3/uL (ref 0.00–0.07)
Basophils Absolute: 0 10*3/uL (ref 0.0–0.1)
Basophils Relative: 0 %
Eosinophils Absolute: 0 10*3/uL (ref 0.0–0.5)
Eosinophils Relative: 1 %
HCT: 41.5 % (ref 39.0–52.0)
Hemoglobin: 13.1 g/dL (ref 13.0–17.0)
Immature Granulocytes: 1 %
Lymphocytes Relative: 49 %
Lymphs Abs: 0.8 10*3/uL (ref 0.7–4.0)
MCH: 29.2 pg (ref 26.0–34.0)
MCHC: 31.6 g/dL (ref 30.0–36.0)
MCV: 92.4 fL (ref 80.0–100.0)
Monocytes Absolute: 0.3 10*3/uL (ref 0.1–1.0)
Monocytes Relative: 16 %
Neutro Abs: 0.6 10*3/uL — ABNORMAL LOW (ref 1.7–7.7)
Neutrophils Relative %: 33 %
Platelets: 44 10*3/uL — ABNORMAL LOW (ref 150–400)
RBC: 4.49 MIL/uL (ref 4.22–5.81)
RDW: 13.3 % (ref 11.5–15.5)
WBC: 1.7 10*3/uL — ABNORMAL LOW (ref 4.0–10.5)
nRBC: 0 % (ref 0.0–0.2)

## 2020-05-29 LAB — BASIC METABOLIC PANEL
Anion gap: 10 (ref 5–15)
BUN: 25 mg/dL — ABNORMAL HIGH (ref 8–23)
CO2: 25 mmol/L (ref 22–32)
Calcium: 9.2 mg/dL (ref 8.9–10.3)
Chloride: 102 mmol/L (ref 98–111)
Creatinine, Ser: 1.35 mg/dL — ABNORMAL HIGH (ref 0.61–1.24)
GFR, Estimated: 57 mL/min — ABNORMAL LOW (ref >60)
Glucose, Bld: 95 mg/dL (ref 70–99)
Potassium: 4.1 mmol/L (ref 3.5–5.1)
Sodium: 137 mmol/L (ref 135–145)

## 2020-05-29 LAB — MAGNESIUM: Magnesium: 2.2 mg/dL (ref 1.7–2.4)

## 2020-05-29 NOTE — Telephone Encounter (Signed)
The pt had come in today for lab work due to leg cramping.    I notified Dr. Ellin Saba when the results had come back.  Per Dr. Ellin Saba, the pt electrolytes and magnesium were normal, so the pt can try a tablespoon of mustard when he has the leg cramps.  Per the pt's wife, the increased dose of Zofran seems to be helping a little with the nausea.  I told them to call our office back if the nausea continues or gets worse.  The pt's wife verbalized understanding of these instructions and said they would continue to use the mustard for the cramps.

## 2020-06-04 ENCOUNTER — Ambulatory Visit (HOSPITAL_COMMUNITY)
Admission: RE | Admit: 2020-06-04 | Discharge: 2020-06-04 | Disposition: A | Discharge status: Home / Self Care | Payer: Medicare Other | Source: Ambulatory Visit | Attending: Hematology and Oncology | Admitting: Hematology and Oncology

## 2020-06-04 ENCOUNTER — Encounter (HOSPITAL_COMMUNITY): Admission: RE | Admit: 2020-06-04 | Payer: Medicare Other | Source: Ambulatory Visit

## 2020-06-04 ENCOUNTER — Other Ambulatory Visit: Payer: Self-pay

## 2020-06-04 DIAGNOSIS — D696 Thrombocytopenia, unspecified: Secondary | ICD-10-CM | POA: Diagnosis not present

## 2020-06-04 DIAGNOSIS — D709 Neutropenia, unspecified: Secondary | ICD-10-CM | POA: Diagnosis not present

## 2020-06-05 DIAGNOSIS — I700 Atherosclerosis of aorta: Secondary | ICD-10-CM | POA: Diagnosis not present

## 2020-06-05 DIAGNOSIS — D696 Thrombocytopenia, unspecified: Secondary | ICD-10-CM | POA: Diagnosis not present

## 2020-06-05 DIAGNOSIS — R161 Splenomegaly, not elsewhere classified: Secondary | ICD-10-CM | POA: Diagnosis not present

## 2020-06-05 DIAGNOSIS — D72819 Decreased white blood cell count, unspecified: Secondary | ICD-10-CM | POA: Diagnosis not present

## 2020-06-05 MED ORDER — FLUDEOXYGLUCOSE F - 18 (FDG) INJECTION
14.3700 | Freq: Once | INTRAVENOUS | Status: AC | PRN
  Administered 2020-06-04: 14.37 via INTRAVENOUS

## 2020-06-07 ENCOUNTER — Inpatient Hospital Stay (HOSPITAL_COMMUNITY): Payer: Medicare Other | Attending: Hematology

## 2020-06-07 ENCOUNTER — Other Ambulatory Visit: Payer: Self-pay

## 2020-06-07 ENCOUNTER — Inpatient Hospital Stay (HOSPITAL_BASED_OUTPATIENT_CLINIC_OR_DEPARTMENT_OTHER): Payer: Medicare Other | Admitting: Hematology

## 2020-06-07 VITALS — BP 113/91 | HR 80 | Temp 97.9°F | Resp 18 | Wt 245.0 lb

## 2020-06-07 DIAGNOSIS — R0602 Shortness of breath: Secondary | ICD-10-CM | POA: Diagnosis not present

## 2020-06-07 DIAGNOSIS — C4359 Malignant melanoma of other part of trunk: Secondary | ICD-10-CM | POA: Diagnosis not present

## 2020-06-07 DIAGNOSIS — G479 Sleep disorder, unspecified: Secondary | ICD-10-CM | POA: Diagnosis not present

## 2020-06-07 DIAGNOSIS — R42 Dizziness and giddiness: Secondary | ICD-10-CM | POA: Insufficient documentation

## 2020-06-07 DIAGNOSIS — Z808 Family history of malignant neoplasm of other organs or systems: Secondary | ICD-10-CM | POA: Insufficient documentation

## 2020-06-07 DIAGNOSIS — D696 Thrombocytopenia, unspecified: Secondary | ICD-10-CM

## 2020-06-07 DIAGNOSIS — K5900 Constipation, unspecified: Secondary | ICD-10-CM | POA: Diagnosis not present

## 2020-06-07 DIAGNOSIS — Z79899 Other long term (current) drug therapy: Secondary | ICD-10-CM | POA: Diagnosis not present

## 2020-06-07 DIAGNOSIS — R5383 Other fatigue: Secondary | ICD-10-CM | POA: Insufficient documentation

## 2020-06-07 DIAGNOSIS — D72819 Decreased white blood cell count, unspecified: Secondary | ICD-10-CM | POA: Diagnosis not present

## 2020-06-07 DIAGNOSIS — Z8249 Family history of ischemic heart disease and other diseases of the circulatory system: Secondary | ICD-10-CM | POA: Diagnosis not present

## 2020-06-07 DIAGNOSIS — Z87891 Personal history of nicotine dependence: Secondary | ICD-10-CM | POA: Diagnosis not present

## 2020-06-07 DIAGNOSIS — R112 Nausea with vomiting, unspecified: Secondary | ICD-10-CM | POA: Diagnosis not present

## 2020-06-07 DIAGNOSIS — M549 Dorsalgia, unspecified: Secondary | ICD-10-CM | POA: Diagnosis not present

## 2020-06-07 DIAGNOSIS — K219 Gastro-esophageal reflux disease without esophagitis: Secondary | ICD-10-CM | POA: Diagnosis not present

## 2020-06-07 DIAGNOSIS — C773 Secondary and unspecified malignant neoplasm of axilla and upper limb lymph nodes: Secondary | ICD-10-CM | POA: Insufficient documentation

## 2020-06-07 DIAGNOSIS — R161 Splenomegaly, not elsewhere classified: Secondary | ICD-10-CM | POA: Insufficient documentation

## 2020-06-07 LAB — CBC WITH DIFFERENTIAL/PLATELET
Abs Immature Granulocytes: 0.01 10*3/uL (ref 0.00–0.07)
Basophils Absolute: 0 10*3/uL (ref 0.0–0.1)
Basophils Relative: 0 %
Eosinophils Absolute: 0 10*3/uL (ref 0.0–0.5)
Eosinophils Relative: 0 %
HCT: 40.6 % (ref 39.0–52.0)
Hemoglobin: 13.3 g/dL (ref 13.0–17.0)
Immature Granulocytes: 1 %
Lymphocytes Relative: 44 %
Lymphs Abs: 0.7 10*3/uL (ref 0.7–4.0)
MCH: 29.7 pg (ref 26.0–34.0)
MCHC: 32.8 g/dL (ref 30.0–36.0)
MCV: 90.6 fL (ref 80.0–100.0)
Monocytes Absolute: 0.2 10*3/uL (ref 0.1–1.0)
Monocytes Relative: 12 %
Neutro Abs: 0.7 10*3/uL — ABNORMAL LOW (ref 1.7–7.7)
Neutrophils Relative %: 43 %
Platelets: 61 10*3/uL — ABNORMAL LOW (ref 150–400)
RBC: 4.48 MIL/uL (ref 4.22–5.81)
RDW: 13.1 % (ref 11.5–15.5)
WBC: 1.6 10*3/uL — ABNORMAL LOW (ref 4.0–10.5)
nRBC: 0 % (ref 0.0–0.2)

## 2020-06-07 LAB — COMPREHENSIVE METABOLIC PANEL
ALT: 29 U/L (ref 0–44)
AST: 40 U/L (ref 15–41)
Albumin: 4.3 g/dL (ref 3.5–5.0)
Alkaline Phosphatase: 60 U/L (ref 38–126)
Anion gap: 11 (ref 5–15)
BUN: 23 mg/dL (ref 8–23)
CO2: 25 mmol/L (ref 22–32)
Calcium: 9.1 mg/dL (ref 8.9–10.3)
Chloride: 100 mmol/L (ref 98–111)
Creatinine, Ser: 1.42 mg/dL — ABNORMAL HIGH (ref 0.61–1.24)
GFR, Estimated: 54 mL/min — ABNORMAL LOW (ref >60)
Glucose, Bld: 99 mg/dL (ref 70–99)
Potassium: 4.4 mmol/L (ref 3.5–5.1)
Sodium: 136 mmol/L (ref 135–145)
Total Bilirubin: 0.7 mg/dL (ref 0.3–1.2)
Total Protein: 8.2 g/dL — ABNORMAL HIGH (ref 6.5–8.1)

## 2020-06-07 LAB — LACTATE DEHYDROGENASE: LDH: 205 U/L — ABNORMAL HIGH (ref 98–192)

## 2020-06-07 NOTE — Patient Instructions (Signed)
[place] at [facility] Discharge Instructions  You were seen today by Dr. Delton Coombes. He went over your recent results and scans. You will be scheduled for a bone marrow biopsy in [facility] and an ultrasound of your abdomen. Dr. Delton Coombes will see you back in 4 weeks for labs and follow up.   Thank you for choosing [place] at [facility] to provide your oncology and hematology care.  To afford each patient quality time with our provider, please arrive at least 15 minutes before your scheduled appointment time.   If you have a lab appointment with the [place] please come in thru the Main Entrance and check in at the main information desk  You need to re-schedule your appointment should you arrive 10 or more minutes late.  We strive to give you quality time with our providers, and arriving late affects you and other patients whose appointments are after yours.  Also, if you no show three or more times for appointments you may be dismissed from the clinic at the providers discretion.     Again, thank you for choosing [facility].  Our hope is that these requests will decrease the amount of time that you wait before being seen by our physicians.       _____________________________________________________________  Should you have questions after your visit to [facility], please contact our office at [phone] between the hours of 8:00 a.m. and 4:30 p.m.  Voicemails left after 4:00 p.m. will not be returned until the following business day.  For prescription refill requests, have your pharmacy contact our office and allow 72 hours.    Cancer Center Support Programs:   > Cancer Support Group  2nd Tuesday of the month 1pm-2pm, Journey Room

## 2020-06-07 NOTE — Progress Notes (Signed)
[name],  [id number]   CLINIC:  Medical Oncology/Hematology  PCP:  [facility], [name] [id number]  [phone]  REASON FOR VISIT:  Follow-up for immune thrombocytopenia  PRIOR THERAPY: Prednisone 60 mg  CURRENT THERAPY: Under work-up  INTERVAL HISTORY:  [name], a 70 y.o. male, returns for routine follow-up for his immune thrombocytopenia. [name] was last seen by Dr. Benay Cochran on 05/22/2020.  Today he is accompanied by his wife and he reports feeling poorly. He stopped taking prednisone after 3 days due to not being able to sleep. He continues feeling fatigued daily and has no energy to do anything. He also had a heart cath on 05/10/2020 at [facility]. He received PRBC's twice in his life after having hematochezia. He denies history of RA, lupus, Crohn's or UC.   REVIEW OF SYSTEMS:  Review of Systems  Constitutional: Positive for fatigue (depleted). Negative for appetite change.  Respiratory: Positive for shortness of breath.   Gastrointestinal: Positive for constipation, nausea and vomiting.  Musculoskeletal: Positive for back pain (5/10 back pain).  Neurological: Positive for dizziness.  Psychiatric/Behavioral: Positive for sleep disturbance (if taking prednisone).  All other systems reviewed and are negative.   PAST MEDICAL/SURGICAL HISTORY:  Past Medical History:  Diagnosis Date  . Anxiety    patient denies  . Aortic aneurysm (HCC)    ascending  . Arthritis   . Barrett esophagus   . Blood transfusion   . Cancer ([place]) 2007   melanoma stage 3 stomach bx  . Coronary artery disease    mild by 09/08/13 cath [facility]  . GERD (gastroesophageal reflux disease)    barretts esophagus  . H/O hiatal hernia   . Headache(784.0)   . Hypercholesteremia   . Hypertension   . PONV (postoperative nausea and vomiting)    trouble breathing after last surgery (at [facility])  . Shingles     10  . Sleep apnea    not tested  . Tumor liver    no problems   Past Surgical History:  Procedure Laterality Date  . ABDOMINAL SURGERY     x3  . back surgeries     x 9  . BACK SURGERY     x8  . BIOPSY N/A 03/28/2013   Procedure: Distal Esophageal Biopsy;  Surgeon: Rogene Houston, MD;  Location: [facility];  Service: Endoscopy;  Laterality: N/A;  . BIOPSY  05/30/2016   Procedure: BIOPSY;  Surgeon: Rogene Houston, MD;  [facility];  Service: Endoscopy;;  . BIOPSY  02/19/2018   Procedure: BIOPSY;  Surgeon: Rogene Houston, MD;  [facility];  Service: Endoscopy;;  Barret's esophagus  . CHEST SURGERY     lung incisional hernia for hernia  . COLONOSCOPY WITH PROPOFOL N/A 02/19/2018   Procedure: COLONOSCOPY WITH PROPOFOL;  Surgeon: Rogene Houston, MD;  [facility];  Service: Endoscopy;  Laterality: N/A;  1:00  . ESOPHAGOGASTRODUODENOSCOPY (EGD) WITH PROPOFOL N/A 03/28/2013   Procedure: ESOPHAGOGASTRODUODENOSCOPY (EGD) WITH PROPOFOL;  Surgeon: Rogene Houston, MD;  Location: [facility];  Service: Endoscopy;  Laterality: N/A;  GE junction @ 38, proximal  margin $Remov'@37'SMXexg$   . ESOPHAGOGASTRODUODENOSCOPY (EGD) WITH PROPOFOL N/A 05/30/2016   Procedure: ESOPHAGOGASTRODUODENOSCOPY (EGD) WITH PROPOFOL;  Surgeon: Rogene Houston, MD;  [facility];  Service: Endoscopy;  Laterality: N/A;  955  . ESOPHAGOGASTRODUODENOSCOPY (EGD) WITH PROPOFOL N/A 02/19/2018  Procedure: ESOPHAGOGASTRODUODENOSCOPY (EGD) WITH PROPOFOL;  Surgeon: Rogene Houston, MD;  [facility];  Service: Endoscopy;  Laterality: N/A;  . HEMORRHOID SURGERY     x2  . HERNIA REPAIR    . HIATAL HERNIA REPAIR    . JOINT REPLACEMENT     lft knee  . KNEE ARTHROPLASTY     lft  . KNEE ARTHROSCOPY Right   . LEG SURGERY     x4 crushed   . LITHOTRIPSY    . MELANOMA EXCISION     lft abdomen  . NECK SURGERY  2012  . POLYPECTOMY  02/19/2018   Procedure: POLYPECTOMY;  Surgeon: Rogene Houston, MD;  [facility];  Service: Endoscopy;;  proximal transverse colon (CB x1), descending colon (CBx3)  . POSTERIOR CERVICAL FUSION/FORAMINOTOMY  08/18/2011   Procedure: POSTERIOR CERVICAL FUSION/FORAMINOTOMY LEVEL 3;  Surgeon: Ophelia Charter, MD;  Location: [place] [facility];  Service: Neurosurgery;  Laterality: N/A;  Cervical three-four, four-five, five-six posterior cervical  fusion with instrumentation; Right cervical three-four, four-five laminotomy  . REVISION TOTAL HIP ARTHROPLASTY Left   . SHOULDER ARTHROSCOPY     rt bith defect , spur, rotator cuff  . SPINAL CORD STIMULATOR IMPLANT     rt hip  . SPINAL CORD STIMULATOR INSERTION N/A 11/03/2016   Procedure: LUMBAR SPINAL CORD STIMULATOR REVISION;  Surgeon: Newman Pies, MD;  Location: [place];  Service: Neurosurgery;  Laterality: N/A;  . THYROID LOBECTOMY     rt    SOCIAL HISTORY:  Social History   Socioeconomic History  . Marital status: Married    Spouse name: Not on file  . Number of children: Not on file  . Years of education: Not on file  . Highest education level: Not on file  Occupational History  . Occupation: retired  Tobacco Use  . Smoking status: Former Smoker    Packs/day: 4.00    Years: 10.00    Pack years: 40.00    Types: Cigarettes    Quit date: 08/12/1980    Years since quitting: 39.8  . Smokeless tobacco: Never Used  . Tobacco comment: occ wine  Vaping Use  . Vaping Use: Never used  Substance and Sexual Activity  . Alcohol use: Yes    Alcohol/week: 0.0 standard drinks    Comment: rarely  . Drug use: No  . Sexual activity: Yes    Birth control/protection: None  Other Topics Concern  . Not on file  Social History Narrative  . Not on file   Social Determinants of Health   Financial Resource Strain: Low Risk   . Difficulty of Paying Living Expenses: Not hard at all  Food Insecurity: No Food Insecurity  . Worried About Charity fundraiser in the Last Year: Never true  . Ran Out of Food  in the Last Year: Never true  Transportation Needs: No Transportation Needs  . Lack of Transportation (Medical): No  . Lack of Transportation (Non-Medical): No  Physical Activity: Inactive  . Days of Exercise per Week: 0 days  . Minutes of Exercise per Session: 0 min  Stress: No Stress Concern Present  . Feeling of Stress : Not at all  Social Connections: Moderately Integrated  . Frequency of Communication with Friends and Family: More than three times a week  . Frequency of Social Gatherings with Friends and Family: Once a week  . Attends Religious Services: More than 4 times per year  . Active Member of Clubs or Organizations:  No  . Attends Club or Organization Meetings: Never  . Marital Status: Married  Human resources officer Violence: Not At Risk  . Fear of Current or Ex-Partner: No  . Emotionally Abused: No  . Physically Abused: No  . Sexually Abused: No    FAMILY HISTORY:  Family History  Problem Relation Age of Onset  . Heart disease Father   . Aneurysm Mother   . Aneurysm Sister   . Heart disease Paternal Grandmother   . Heart disease Paternal Grandfather   . Heart disease Maternal Grandfather   . Heart disease Brother        heart transplant  . Aneurysm Other        pt states several members have aortic aneurysm  . Skin cancer Other        several members  . Lymphoma Brother   . Aneurysm Maternal Grandmother     CURRENT MEDICATIONS:  Current Outpatient Medications  Medication Sig Dispense Refill  . Bioflavonoid Products (ESTER C PO) Take 1 tablet by mouth daily.     Marland Kitchen Bioflavonoid Products (ESTER-C) 500-550 MG TABS Take 1 tablet by mouth daily.    . calcium-vitamin D (OSCAL WITH D) 500-200 MG-UNIT per tablet Take 1 tablet by mouth 2 (two) times daily.     . clopidogrel (PLAVIX) 75 MG tablet TAKE 1 TABLET BY MOUTH ONCE DAILY START DATE 9 10 2020    . cyclobenzaprine (FLEXERIL) 10 MG tablet Take 1 tablet (10 mg total) by mouth 3 (three) times daily as needed for  muscle spasms. 50 tablet 1  . dexlansoprazole (DEXILANT) 60 MG capsule Take 60 mg by mouth daily.     . diazepam (VALIUM) 10 MG tablet Take 10 mg by mouth 4 (four) times daily as needed (for back pain).     . fexofenadine (ALLEGRA) 180 MG tablet Take 180 mg by mouth every evening.    . fexofenadine (ALLEGRA) 180 MG tablet Take by mouth.    . fish oil-omega-3 fatty acids 1000 MG capsule Take 1 g by mouth 2 (two) times daily.    Marland Kitchen lisinopril (PRINIVIL,ZESTRIL) 10 MG tablet Take 10 mg by mouth daily.    . metoCLOPramide (REGLAN) 10 MG tablet TAKE ONE TABLET BY MOUTH TWICE A DAY AS NEEDED FOR NAUSEA 60 tablet 2  . montelukast (SINGULAIR) 10 MG tablet Take 10 mg by mouth daily.     . Multiple Vitamin (MULITIVITAMIN WITH MINERALS) TABS Take 1 tablet by mouth daily.    . ondansetron (ZOFRAN) 8 MG tablet Take 1 tablet (8 mg total) by mouth every 8 (eight) hours as needed for nausea or vomiting. 60 tablet 3  . oxyCODONE-acetaminophen (PERCOCET) 10-325 MG tablet Take 1 tablet by mouth every 6 (six) hours as needed for pain.    . predniSONE (DELTASONE) 20 MG tablet Take 3 tablets (60 mg total) by mouth daily with breakfast. 30 tablet 0  . pregabalin (LYRICA) 150 MG capsule Take by mouth.    . ranolazine (RANEXA) 500 MG 12 hr tablet Take 500 mg by mouth 2 (two) times daily.    . rosuvastatin (CRESTOR) 10 MG tablet Take 10 mg by mouth every evening.     . sildenafil (VIAGRA) 100 MG tablet TAKE ONE-HALF TABLET BY MOUTH AS INSTRUCTED (TAKE 1 HOUR PRIOR TO SEXUAL ACTIVITY *DO NOT EXCEED 1 DOSE PER 24 HOUR PERIOD*)    . Aspirin-Acetaminophen-Caffeine (GOODY HEADACHE PO) Take 1-2 packets by mouth daily as needed (headaches). (Patient not taking: Reported on 06/07/2020)  No current facility-administered medications for this visit.    ALLERGIES:  Allergies  Allergen Reactions  . Demerol Other (See Comments)    "Makes me crazy."  . Contrast Media [Iodinated Diagnostic Agents] Nausea Only    PHYSICAL EXAM:   Performance status (ECOG): 1 - Symptomatic but completely ambulatory  Vitals:   06/07/20 1008  BP: (!) 113/91  Pulse: 80  Resp: 18  Temp: 97.9 F (36.6 C)  SpO2: 97%   Wt Readings from Last 3 Encounters:  06/07/20 245 lb (111.1 kg)  05/22/20 255 lb 4.7 oz (115.8 kg)  05/14/20 260 lb (117.9 kg)   Physical Exam Vitals reviewed.  Constitutional:      Appearance: Normal appearance. He is obese.  Cardiovascular:     Rate and Rhythm: Normal rate and regular rhythm.     Pulses: Normal pulses.     Heart sounds: Normal heart sounds.  Pulmonary:     Effort: Pulmonary effort is normal.     Breath sounds: Normal breath sounds.  Abdominal:     Palpations: Abdomen is soft. There is no hepatomegaly, splenomegaly or mass.     Tenderness: There is no abdominal tenderness.     Hernia: No hernia is present.  Musculoskeletal:     Right lower leg: No edema.     Left lower leg: No edema.  Neurological:     General: No focal deficit present.     Mental Status: He is alert and oriented to person, place, and time.  Psychiatric:        Mood and Affect: Mood normal.        Behavior: Behavior normal.     LABORATORY DATA:  I have reviewed the labs as listed.  CBC Latest Ref Rng & Units 06/07/2020 05/29/2020 05/24/2020  WBC 4.0 - 10.5 K/uL 1.6(L) 1.7(L) 1.3(LL)  Hemoglobin 13.0 - 17.0 g/dL 30.1 75.1 12.5(L)  Hematocrit 39.0 - 52.0 % 40.6 41.5 38.2(L)  Platelets 150 - 400 K/uL 61(L) 44(L) 34(L)   CMP Latest Ref Rng & Units 06/07/2020 05/29/2020 05/22/2020  Glucose 70 - 99 mg/dL 99 95 204(H)  BUN 8 - 23 mg/dL 23 37(M) 22  Creatinine 0.61 - 1.24 mg/dL 5.66(Z) 0.50(F) 9.97  Sodium 135 - 145 mmol/L 136 137 135  Potassium 3.5 - 5.1 mmol/L 4.4 4.1 3.5  Chloride 98 - 111 mmol/L 100 102 105  CO2 22 - 32 mmol/L 25 25 21(L)  Calcium 8.9 - 10.3 mg/dL 9.1 9.2 8.9  Total Protein 6.5 - 8.1 g/dL 8.2(H) - 8.0  Total Bilirubin 0.3 - 1.2 mg/dL 0.7 - 1.0  Alkaline Phos 38 - 126 U/L 60 - 58  AST 15 - 41  U/L 40 - 61(H)  ALT 0 - 44 U/L 29 - 42      Component Value Date/Time   RBC 4.48 06/07/2020 0928   MCV 90.6 06/07/2020 0928   MCH 29.7 06/07/2020 0928   MCHC 32.8 06/07/2020 0928   RDW 13.1 06/07/2020 0928   LYMPHSABS 0.7 06/07/2020 0928   MONOABS 0.2 06/07/2020 0928   EOSABS 0.0 06/07/2020 0928   BASOSABS 0.0 06/07/2020 0928   Lab Results  Component Value Date   LDH 205 (H) 06/07/2020   LDH 198 (H) 05/22/2020   LDH 138 05/09/2020    DIAGNOSTIC IMAGING:  I have independently reviewed the scans and discussed with the patient. NM PET Image Initial (PI) Skull Base To Thigh  Result Date: 06/05/2020 CLINICAL DATA:  Initial treatment strategy for thrombocytopenia. Leukopenia.  EXAM: NUCLEAR MEDICINE PET SKULL BASE TO THIGH TECHNIQUE: 14.4 mCi F-18 FDG was injected intravenously. Full-ring PET imaging was performed from the skull base to thigh after the radiotracer. CT data was obtained and used for attenuation correction and anatomic localization. Fasting blood glucose: 105 mg/dl COMPARISON:  None available FINDINGS: Mediastinal blood pool activity: SUV max 2.2 Liver activity: SUV max 3.8 NECK: No hypermetabolic lymph nodes in the neck. Incidental CT findings: none CHEST: No hypermetabolic mediastinal or hilar nodes. No suspicious pulmonary nodules on the CT scan. Incidental CT findings: none ABDOMEN/PELVIS: Spleen is enlarged measuring 18 cm in craniocaudad dimension. No abnormal hypermetabolic activity. No enlarged or hypermetabolic abdominal or pelvic lymph nodes. Incidental CT findings: Atherosclerotic calcification of the aorta. SKELETON: No focal hypermetabolic activity to suggest skeletal metastasis. Incidental CT findings: none IMPRESSION: 1. Splenomegaly with normal metabolic activity. 2. No hypermetabolic nodes in the chest, abdomen, or pelvis. No lymphadenopathy. Electronically Signed   By: Suzy Bouchard M.D.   On: 06/05/2020 10:44     ASSESSMENT:  1.  Moderate  thrombocytopenia: -Seen as referral from Dr. Nolon Rod office for decreased platelet count of 80 on CBC on 10/21/2018. -Ex-smoker, quit in 1982, smoked 1 to 2 packs/day for 10 years. He also spent 2 years at [facility]. -CT abdomen on 11/08/2018 showed normal size spleen with no abdominal or pelvic pathology. -BMBX on 03/01/2019 showed slightly hypercellular marrow for age with trilineage hematopoiesis including abundant megakaryocytes and predominantly normal morphology. Overall myeloid changes are nonspecific and not diagnostic for MDS. Chromosome analysis was normal. MDS FISH panel was normal. Core biopsy showed small lymphoid aggregates, features are not considered specific or diagnostic of lymphoproliferative disorder. -He was treated with pulsed dexamethasone around the 01/27/2020 with improvement of platelet count to 70 K.  He could not tolerate dexamethasone due to sleeplessness. -He was also treated with prednisone 60 mg daily on 05/22/2020.  He took it only for 2 to 3 days and stopped it due to inability to sleep. -Treated with immunoglobulin from 05/14/2020 through 05/18/2020. -PET scan on 06/05/2020 view shows no hypermetabolic lymph nodes in the chest, abdomen or pelvis.  Splenomegaly measuring 18 cm with normal metabolic activity.  2.  Malignant melanoma: -Melanoma resected on the left side of the abdomen with left axillary sentinel lymph node biopsy at [facility] 14 years ago.  Reportedly stage III.   PLAN:  1.  Moderate thrombocytopenia: -Platelet count today 61.  No bleeding issues noted. -He has new onset splenomegaly which was not seen in CT scan from 2020.  Etiology includes underlying liver disease.  Nonmalignant as there is no uptake. -Recommend doing ultrasound of the abdomen with elastography. -He has previous hepatitis B and C serology which was negative. -He will follow up with Dr. Laural Golden for further work-up. -I have also recommended repeating a bone marrow aspiration and  biopsy for evolving bone marrow process.  We will also check cytogenetics and MDS FISH panel. -RTC after the bone marrow biopsy.  2. Mild leukopenia: -White count today is 1.6 with ANC of 0.7.  Underlying etiology splenomegaly. -Recommend bone marrow aspiration and biopsy.  3. Malignant melanoma: -No evidence of lymphadenopathy or recurrence at this time based on PET scan.  Orders placed this encounter:  No orders of the defined types were placed in this encounter.    Derek Jack, MD [place] [phone]   I, Milinda Antis, am acting as a scribe for Dr. Sanda Linger.  Kinnie Scales MD, have reviewed  the above documentation for accuracy and completeness, and I agree with the above.

## 2020-06-14 ENCOUNTER — Other Ambulatory Visit: Payer: Self-pay

## 2020-06-14 ENCOUNTER — Ambulatory Visit (HOSPITAL_COMMUNITY)
Admission: RE | Admit: 2020-06-14 | Discharge: 2020-06-14 | Disposition: A | Discharge status: Home / Self Care | Payer: Medicare Other | Source: Ambulatory Visit | Attending: Hematology | Admitting: Hematology

## 2020-06-14 DIAGNOSIS — D696 Thrombocytopenia, unspecified: Secondary | ICD-10-CM | POA: Diagnosis not present

## 2020-06-14 DIAGNOSIS — R161 Splenomegaly, not elsewhere classified: Secondary | ICD-10-CM | POA: Diagnosis not present

## 2020-06-25 ENCOUNTER — Other Ambulatory Visit: Payer: Self-pay | Admitting: Radiology

## 2020-06-26 ENCOUNTER — Ambulatory Visit (HOSPITAL_COMMUNITY)
Admission: RE | Admit: 2020-06-26 | Discharge: 2020-06-26 | Disposition: A | Discharge status: Home / Self Care | Payer: Medicare Other | Source: Ambulatory Visit | Attending: Hematology | Admitting: Hematology

## 2020-06-26 ENCOUNTER — Other Ambulatory Visit: Payer: Self-pay

## 2020-06-26 ENCOUNTER — Encounter (HOSPITAL_COMMUNITY): Payer: Self-pay

## 2020-06-26 DIAGNOSIS — K219 Gastro-esophageal reflux disease without esophagitis: Secondary | ICD-10-CM | POA: Insufficient documentation

## 2020-06-26 DIAGNOSIS — I2510 Atherosclerotic heart disease of native coronary artery without angina pectoris: Secondary | ICD-10-CM | POA: Diagnosis not present

## 2020-06-26 DIAGNOSIS — D72819 Decreased white blood cell count, unspecified: Secondary | ICD-10-CM | POA: Diagnosis not present

## 2020-06-26 DIAGNOSIS — E785 Hyperlipidemia, unspecified: Secondary | ICD-10-CM | POA: Insufficient documentation

## 2020-06-26 DIAGNOSIS — D696 Thrombocytopenia, unspecified: Secondary | ICD-10-CM | POA: Diagnosis not present

## 2020-06-26 DIAGNOSIS — Z79899 Other long term (current) drug therapy: Secondary | ICD-10-CM | POA: Insufficient documentation

## 2020-06-26 DIAGNOSIS — Z87891 Personal history of nicotine dependence: Secondary | ICD-10-CM | POA: Insufficient documentation

## 2020-06-26 DIAGNOSIS — I10 Essential (primary) hypertension: Secondary | ICD-10-CM | POA: Diagnosis not present

## 2020-06-26 DIAGNOSIS — C439 Malignant melanoma of skin, unspecified: Secondary | ICD-10-CM | POA: Insufficient documentation

## 2020-06-26 DIAGNOSIS — R161 Splenomegaly, not elsewhere classified: Secondary | ICD-10-CM | POA: Insufficient documentation

## 2020-06-26 DIAGNOSIS — C8510 Unspecified B-cell lymphoma, unspecified site: Secondary | ICD-10-CM | POA: Diagnosis not present

## 2020-06-26 DIAGNOSIS — I714 Abdominal aortic aneurysm, without rupture: Secondary | ICD-10-CM | POA: Diagnosis not present

## 2020-06-26 DIAGNOSIS — Z7902 Long term (current) use of antithrombotics/antiplatelets: Secondary | ICD-10-CM | POA: Diagnosis not present

## 2020-06-26 DIAGNOSIS — Z7952 Long term (current) use of systemic steroids: Secondary | ICD-10-CM | POA: Insufficient documentation

## 2020-06-26 LAB — PROTIME-INR
INR: 1.1 (ref 0.8–1.2)
Prothrombin Time: 13.6 seconds (ref 11.4–15.2)

## 2020-06-26 LAB — CBC WITH DIFFERENTIAL/PLATELET
Abs Immature Granulocytes: 0.01 10*3/uL (ref 0.00–0.07)
Basophils Absolute: 0 10*3/uL (ref 0.0–0.1)
Basophils Relative: 1 %
Eosinophils Absolute: 0 10*3/uL (ref 0.0–0.5)
Eosinophils Relative: 0 %
HCT: 38.9 % — ABNORMAL LOW (ref 39.0–52.0)
Hemoglobin: 13.2 g/dL (ref 13.0–17.0)
Immature Granulocytes: 1 %
Lymphocytes Relative: 41 %
Lymphs Abs: 0.8 10*3/uL (ref 0.7–4.0)
MCH: 30.2 pg (ref 26.0–34.0)
MCHC: 33.9 g/dL (ref 30.0–36.0)
MCV: 89 fL (ref 80.0–100.0)
Monocytes Absolute: 0.2 10*3/uL (ref 0.1–1.0)
Monocytes Relative: 12 %
Neutro Abs: 0.9 10*3/uL — ABNORMAL LOW (ref 1.7–7.7)
Neutrophils Relative %: 45 %
Platelets: 67 10*3/uL — ABNORMAL LOW (ref 150–400)
RBC: 4.37 MIL/uL (ref 4.22–5.81)
RDW: 13.7 % (ref 11.5–15.5)
WBC: 1.9 10*3/uL — ABNORMAL LOW (ref 4.0–10.5)
nRBC: 0 % (ref 0.0–0.2)

## 2020-06-26 MED ORDER — SODIUM CHLORIDE 0.9 % IV SOLN: INTRAVENOUS | Status: DC

## 2020-06-26 MED ORDER — MIDAZOLAM HCL 2 MG/2ML IJ SOLN
INTRAMUSCULAR | Status: AC | PRN
Start: 2020-06-26 — End: 2020-06-26
  Administered 2020-06-26: 2 mg via INTRAVENOUS
  Administered 2020-06-26 (×2): 1 mg via INTRAVENOUS

## 2020-06-26 MED ORDER — MIDAZOLAM HCL 2 MG/2ML IJ SOLN
INTRAMUSCULAR | Status: AC
  Filled 2020-06-26: qty 6

## 2020-06-26 MED ORDER — ONDANSETRON HCL 4 MG/2ML IJ SOLN
INTRAMUSCULAR | Status: AC
  Filled 2020-06-26: qty 2

## 2020-06-26 MED ORDER — FENTANYL CITRATE (PF) 100 MCG/2ML IJ SOLN
INTRAMUSCULAR | Status: AC | PRN
Start: 2020-06-26 — End: 2020-06-26
  Administered 2020-06-26 (×2): 50 ug via INTRAVENOUS

## 2020-06-26 MED ORDER — DIPHENHYDRAMINE HCL 50 MG/ML IJ SOLN
INTRAMUSCULAR | Status: AC
  Filled 2020-06-26: qty 1

## 2020-06-26 MED ORDER — FENTANYL CITRATE (PF) 100 MCG/2ML IJ SOLN
INTRAMUSCULAR | Status: AC
  Filled 2020-06-26: qty 4

## 2020-06-26 MED ORDER — DIPHENHYDRAMINE HCL 50 MG/ML IJ SOLN
INTRAMUSCULAR | Status: AC | PRN
  Administered 2020-06-26: 25 mg via INTRAVENOUS

## 2020-06-26 NOTE — Discharge Instructions (Signed)
Please call Interventional Radiology clinic [phone] with any questions or concerns. ° °You may remove your dressing and shower tomorrow. ° ° °Bone Marrow Aspiration and Bone Marrow Biopsy, Adult, Care After °This sheet gives you information about how to care for yourself after your procedure. Your health care provider may also give you more specific instructions. If you have problems or questions, contact your health care provider. °What can I expect after the procedure? °After the procedure, it is common to have: °Mild pain and tenderness. °Swelling. °Bruising. °Follow these instructions at home: °Puncture site care °Follow instructions from your health care provider about how to take care of the puncture site. Make sure you: °Wash your hands with soap and water before and after you change your bandage (dressing). If soap and water are not available, use hand sanitizer. °Change your dressing as told by your health care provider. °Check your puncture site every day for signs of infection. Check for: °More redness, swelling, or pain. °Fluid or blood. °Warmth. °Pus or a bad smell.   °Activity °Return to your normal activities as told by your health care provider. Ask your health care provider what activities are safe for you. °Do not lift anything that is heavier than 10 lb (4.5 kg), or the limit that you are told, until your health care provider says that it is safe. °Do not drive for 24 hours if you were given a sedative during your procedure. °General instructions °Take over-the-counter and prescription medicines only as told by your health care provider. °Do not take baths, swim, or use a hot tub until your health care provider approves. Ask your health care provider if you may take showers. You may only be allowed to take sponge baths. °If directed, put ice on the affected area. To do this: °Put ice in a plastic bag. °Place a towel between your skin and the bag. °Leave the ice on for 20 minutes, 2-3 times a  day. °Keep all follow-up visits as told by your health care provider. This is important.   °Contact a health care provider if: °Your pain is not controlled with medicine. °You have a fever. °You have more redness, swelling, or pain around the puncture site. °You have fluid or blood coming from the puncture site. °Your puncture site feels warm to the touch. °You have pus or a bad smell coming from the puncture site. °Summary °After the procedure, it is common to have mild pain, tenderness, swelling, and bruising. °Follow instructions from your health care provider about how to take care of the puncture site and what activities are safe for you. °Take over-the-counter and prescription medicines only as told by your health care provider. °Contact a health care provider if you have any signs of infection, such as fluid or blood coming from the puncture site. °This information is not intended to replace advice given to you by your health care provider. Make sure you discuss any questions you have with your health care provider. °Document Revised: 10/05/2018 Document Reviewed: 10/05/2018 °Elsevier Patient Education © 2021 Elsevier Inc. ° ° °Moderate Conscious Sedation, Adult, Care After °This sheet gives you information about how to care for yourself after your procedure. Your health care provider may also give you more specific instructions. If you have problems or questions, contact your health care provider. °What can I expect after the procedure? °After the procedure, it is common to have: °Sleepiness for several hours. °Impaired judgment for several hours. °Difficulty with balance. °Vomiting if you eat too   can I expect after the procedure? After the procedure, it is common to have:  Sleepiness for several hours.  Impaired judgment for several hours.  Difficulty with balance.  Vomiting if you eat too soon. Follow these instructions at home: For the time period you were told by your health care provider:  Rest.  Do not participate in activities where you could fall or become injured.  Do not drive or use machinery.  Do not drink  alcohol.  Do not take sleeping pills or medicines that cause drowsiness.  Do not make important decisions or sign legal documents.  Do not take care of children on your own.      Eating and drinking  Follow the diet recommended by your health care provider.  Drink enough fluid to keep your urine pale yellow.  If you vomit: ? Drink water, juice, or soup when you can drink without vomiting. ? Make sure you have little or no nausea before eating solid foods.   General instructions  Take over-the-counter and prescription medicines only as told by your health care provider.  Have a responsible adult stay with you for the time you are told. It is important to have someone help care for you until you are awake and alert.  Do not smoke.  Keep all follow-up visits as told by your health care provider. This is important. Contact a health care provider if:  You are still sleepy or having trouble with balance after 24 hours.  You feel light-headed.  You keep feeling nauseous or you keep vomiting.  You develop a rash.  You have a fever.  You have redness or swelling around the IV site. Get help right away if:  You have trouble breathing.  You have new-onset confusion at home. Summary  After the procedure, it is common to feel sleepy, have impaired judgment, or feel nauseous if you eat too soon.  Rest after you get home. Know the things you should not do after the procedure.  Follow the diet recommended by your health care provider and drink enough fluid to keep your urine pale yellow.  Get help right away if you have trouble breathing or new-onset confusion at home. This information is not intended to replace advice given to you by your health care provider. Make sure you discuss any questions you have with your health care provider. Document Revised: 09/16/2019 Document Reviewed: 04/14/2019 Elsevier Patient Education  2021 [employer].

## 2020-06-26 NOTE — H&P (Signed)
Chief Complaint: Thrombocytopenia and new onset of splenomegaly. Request is for bone marrow biopsy  Referring Physician(s): Katragadda,Sreedhar  Supervising Physician: Sandi Mariscal  Patient Status: [facility] - Out-pt  History of Present Illness: [name] is a 70 y.o. male Former smoker. History of CAD, HTN, HLD, GERD, melanoma, AAA, immune thrombocytopenia IR performed a bone marrow biopsy on 9.29.20 due to thrombocytopenia. Diagnosis reads slightly hypercellular bone marrow for age with trilineage hematopoiesis predominately normal morhoplogy.  IR previously performed a bone marrow biopsy on 9.29.20. Microbiology shows slightly hypercellular bone marrow with trilineage hematopoiesis and mild leukopenia. Team is requesting a bone marrow biopsy for further evaluation of thrombocytopenia with new onset splenomegaly. Currently without any significant complaints. Mr.  Gonzalez is endorsing back pain but states that is baseline. He is alert and laying in bed, calm and comfortable. Denies any fevers, headache, chest pain, SOB, cough, abdominal pain, nausea, vomiting or bleeding. [name] is expressing concern regarding pain control for the procedure as it states ' I felt the needle go into my bone" when he had a previous bone marrow biopsy on 9.29.20. Return precautions and treatment recommendations and follow-up discussed with the patient who is agreeable with the plan.   Past Medical History:  Diagnosis Date  . Anxiety    patient denies  . Aortic aneurysm (HCC)    ascending  . Arthritis   . Barrett esophagus   . Blood transfusion   . Cancer ([place]) 2007   melanoma stage 3 stomach bx  . Coronary artery disease    mild by 09/08/13 cath HPR  . GERD (gastroesophageal reflux disease)    barretts esophagus  . H/O hiatal hernia   . Headache(784.0)   . Hypercholesteremia   . Hypertension   . PONV (postoperative nausea and vomiting)    trouble breathing after last surgery (at [facility])  . Shingles     10  . Sleep apnea    not tested  . 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 ABDOMINAL SURGERY     x3  . back surgeries     x 9  . BACK SURGERY     x8  . BIOPSY N/A 03/28/2013   Procedure: Distal Esophageal Biopsy;  Surgeon: Rogene Houston, MD;  Location: [facility];  Service: Endoscopy;  Laterality: N/A;  . BIOPSY  05/30/2016   Procedure: BIOPSY;  Surgeon: Rogene Houston, MD;  Location: [facility];  Service: Endoscopy;;  . BIOPSY  02/19/2018   Procedure: BIOPSY;  Surgeon: Rogene Houston, MD;  [facility]: [facility];  Service: Endoscopy;;  Barret's esophagus  . CHEST SURGERY     lung incisional hernia for hernia  . COLONOSCOPY WITH PROPOFOL N/A 02/19/2018   Procedure: COLONOSCOPY WITH PROPOFOL;  Surgeon: Rogene Houston, MD;  [facility]: [facility];  Service: Endoscopy;  Laterality: N/A;  1:00  . ESOPHAGOGASTRODUODENOSCOPY (EGD) WITH PROPOFOL N/A 03/28/2013   Procedure: ESOPHAGOGASTRODUODENOSCOPY (EGD) WITH PROPOFOL;  Surgeon: Rogene Houston, MD;  Location: [facility];  Service: Endoscopy;  Laterality: N/A;  GE junction @ 38, proximal  margin @37   . ESOPHAGOGASTRODUODENOSCOPY (EGD) WITH PROPOFOL N/A 05/30/2016   Procedure: ESOPHAGOGASTRODUODENOSCOPY (EGD) WITH PROPOFOL;  Surgeon: Rogene Houston, MD;  [facility];  Service: Endoscopy;  Laterality: N/A;  955  . ESOPHAGOGASTRODUODENOSCOPY (EGD) WITH PROPOFOL N/A 02/19/2018   Procedure: ESOPHAGOGASTRODUODENOSCOPY (EGD) WITH PROPOFOL;  Surgeon: Rogene Houston, MD;  [facility];  Service: Endoscopy;  Laterality: N/A;  . HEMORRHOID SURGERY     x2  . HERNIA REPAIR    . HIATAL HERNIA REPAIR    . JOINT REPLACEMENT     lft knee  . KNEE ARTHROPLASTY     lft  . KNEE ARTHROSCOPY Right   . LEG SURGERY     x4 crushed   . LITHOTRIPSY    . MELANOMA EXCISION     lft abdomen  . NECK SURGERY  2012  . POLYPECTOMY  02/19/2018   Procedure: POLYPECTOMY;  Surgeon: Rogene Houston, MD;  [facility];  Service: Endoscopy;;  proximal transverse colon (CB x1), descending colon (CBx3)  . POSTERIOR CERVICAL FUSION/FORAMINOTOMY  08/18/2011   Procedure: POSTERIOR CERVICAL FUSION/FORAMINOTOMY LEVEL 3;  Surgeon: Ophelia Charter, MD;  Location: [place] [facility] ORS;  Service: Neurosurgery;  Laterality: N/A;  Cervical three-four, four-five, five-six posterior cervical  fusion with instrumentation; Right cervical three-four, four-five laminotomy  . REVISION TOTAL HIP ARTHROPLASTY Left   . SHOULDER ARTHROSCOPY     rt bith defect , spur, rotator cuff  . SPINAL CORD STIMULATOR IMPLANT     rt hip  . SPINAL CORD STIMULATOR INSERTION N/A 11/03/2016   Procedure: LUMBAR SPINAL CORD STIMULATOR REVISION;  Surgeon: Newman Pies, MD;  Location: [facility];  Service: Neurosurgery;  Laterality: N/A;  . THYROID LOBECTOMY     rt    Allergies: Demerol and Contrast media [iodinated diagnostic agents]  Medications: Prior to Admission medications   Medication Sig Start Date End Date Taking? Authorizing Provider  Aspirin-Acetaminophen-Caffeine (GOODY HEADACHE PO) Take 1-2 packets by mouth daily as needed (headaches).   Yes [provider]  Bioflavonoid Products (ESTER C PO) Take 1 tablet by mouth daily.    Yes [provider]  Bioflavonoid Products (ESTER-C) 500-550 MG TABS Take 1 tablet by mouth daily.   Yes [provider]  calcium-vitamin D (OSCAL WITH D) 500-200 MG-UNIT per tablet Take 1 tablet by mouth 2 (two) times daily.    Yes [provider]  cyclobenzaprine (FLEXERIL) 10 MG tablet Take 1 tablet (10 mg total) by mouth 3 (three) times daily as needed for muscle spasms. 11/05/16  Yes Newman Pies, MD  dexlansoprazole (DEXILANT) 60 MG capsule Take 60 mg by mouth daily.    Yes [provider]  diazepam (VALIUM) 10 MG tablet Take 10 mg by mouth 4 (four) times daily as needed (for back pain).    Yes [provider]  fexofenadine  (ALLEGRA) 180 MG tablet Take by mouth.   Yes [provider]  fish oil-omega-3 fatty acids 1000 MG capsule Take 1 g by mouth 2 (two) times daily.   Yes [provider]  lisinopril (PRINIVIL,ZESTRIL) 10 MG tablet Take 10 mg by mouth daily.   Yes [provider]  metoCLOPramide (REGLAN) 10 MG tablet TAKE ONE TABLET BY MOUTH TWICE A DAY AS NEEDED FOR NAUSEA 12/29/19  Yes Laurine Blazer B, PA-C  montelukast (SINGULAIR) 10 MG tablet Take 10 mg by mouth daily.    Yes [provider]  Multiple Vitamin (MULITIVITAMIN WITH MINERALS) TABS Take 1 tablet by mouth daily.   Yes [provider]  ondansetron (ZOFRAN) 8 MG tablet Take 1 tablet (8 mg total) by mouth every 8 (eight) hours as needed for nausea or vomiting. 05/28/20  Yes Derek Jack, MD  pregabalin (LYRICA) 150 MG capsule Take by mouth.   Yes [provider]  ranolazine (RANEXA)  500 MG 12 hr tablet Take 500 mg by mouth 2 (two) times daily. 04/23/20  Yes [provider]  rosuvastatin (CRESTOR) 10 MG tablet Take 10 mg by mouth every evening.    Yes [provider]  clopidogrel (PLAVIX) 75 MG tablet TAKE 1 TABLET BY MOUTH ONCE DAILY START DATE 9 10 2020 02/09/19   [provider]  fexofenadine (ALLEGRA) 180 MG tablet Take 180 mg by mouth every evening.    [provider]  oxyCODONE-acetaminophen (PERCOCET) 10-325 MG tablet Take 1 tablet by mouth every 6 (six) hours as needed for pain.    [provider]  predniSONE (DELTASONE) 20 MG tablet Take 3 tablets (60 mg total) by mouth daily with breakfast. 05/22/20   Benay Pike, MD  sildenafil (VIAGRA) 100 MG tablet TAKE ONE-HALF TABLET BY MOUTH AS INSTRUCTED (TAKE 1 HOUR PRIOR TO SEXUAL ACTIVITY *DO NOT EXCEED 1 DOSE PER 24 HOUR PERIOD*) 02/10/20   [provider]     Family History  Problem Relation Age of Onset  . Heart disease Father   . Aneurysm Mother   . Aneurysm Sister   . Heart  disease Paternal Grandmother   . Heart disease Paternal Grandfather   . Heart disease Maternal Grandfather   . Heart disease Brother        heart transplant  . Aneurysm Other        pt states several members have aortic aneurysm  . Skin cancer Other        several members  . Lymphoma Brother   . Aneurysm Maternal Grandmother     Social History   Socioeconomic History  . Marital status: Married    Spouse name: Not on file  . Number of children: Not on file  . Years of education: Not on file  . Highest education level: Not on file  Occupational History  . Occupation: retired  Tobacco Use  . Smoking status: Former Smoker    Packs/day: 4.00    Years: 10.00    Pack years: 40.00    Types: Cigarettes    Quit date: 08/12/1980    Years since quitting: 39.8  . Smokeless tobacco: Never Used  . Tobacco comment: occ wine  Vaping Use  . Vaping Use: Never used  Substance and Sexual Activity  . Alcohol use: Yes    Alcohol/week: 0.0 standard drinks    Comment: rarely  . Drug use: No  . Sexual activity: Yes    Birth control/protection: None  Other Topics Concern  . Not on file  Social History Narrative  . Not on file   Social Determinants of Health   Financial Resource Strain: Low Risk   . Difficulty of Paying Living Expenses: Not hard at all  Food Insecurity: No Food Insecurity  . Worried About Charity fundraiser in the Last Year: Never true  . Ran Out of Food in the Last Year: Never true  Transportation Needs: No Transportation Needs  . Lack of Transportation (Medical): No  . Lack of Transportation (Non-Medical): No  Physical Activity: Inactive  . Days of Exercise per Week: 0 days  . Minutes of Exercise per Session: 0 min  Stress: No Stress Concern Present  . Feeling of Stress : Not at all  Social Connections: Moderately Integrated  . Frequency of Communication with Friends and Family: More than three times a week  . Frequency of Social Gatherings with Friends and  Family: Once a week  . Attends Religious Services: More  than 4 times per year  . Active Member of Clubs or Organizations: No  . Attends Archivist Meetings: Never  . Marital Status: Married      Review of Systems: A 12 point ROS discussed and pertinent positives are indicated in the HPI above.  All other systems are negative.  Review of Systems  Constitutional: Negative for fever.  HENT: Negative for congestion.   Respiratory: Negative for cough and shortness of breath.   Cardiovascular: Negative for chest pain.  Gastrointestinal: Negative for abdominal pain.  Musculoskeletal: Positive for back pain.  Neurological: Negative for headaches.  Psychiatric/Behavioral: Negative for behavioral problems and confusion.    Vital Signs: BP (!) 121/101   Pulse 87   Temp 97.7 F (36.5 C) (Oral)   Resp 18   SpO2 96%   Physical Exam Vitals and nursing note reviewed.  Constitutional:      Appearance: He is well-developed and well-nourished.  HENT:     Head: Normocephalic.  Cardiovascular:     Rate and Rhythm: Normal rate and regular rhythm.     Heart sounds: Normal heart sounds.  Pulmonary:     Effort: Pulmonary effort is normal.     Breath sounds: Normal breath sounds.  Musculoskeletal:        General: Normal range of motion.     Cervical back: Normal range of motion.  Skin:    General: Skin is dry.  Neurological:     Mental Status: He is alert and oriented to person, place, and time.  Psychiatric:        Mood and Affect: Mood and affect normal.     Imaging: NM PET Image Initial (PI) Skull Base To Thigh  Result Date: 06/05/2020 CLINICAL DATA:  Initial treatment strategy for thrombocytopenia. Leukopenia. EXAM: NUCLEAR MEDICINE PET SKULL BASE TO THIGH TECHNIQUE: 14.4 mCi F-18 FDG was injected intravenously. Full-ring PET imaging was performed from the skull base to thigh after the radiotracer. CT data was obtained and used for attenuation correction and anatomic  localization. Fasting blood glucose: 105 mg/dl COMPARISON:  None available FINDINGS: Mediastinal blood pool activity: SUV max 2.2 Liver activity: SUV max 3.8 NECK: No hypermetabolic lymph nodes in the neck. Incidental CT findings: none CHEST: No hypermetabolic mediastinal or hilar nodes. No suspicious pulmonary nodules on the CT scan. Incidental CT findings: none ABDOMEN/PELVIS: Spleen is enlarged measuring 18 cm in craniocaudad dimension. No abnormal hypermetabolic activity. No enlarged or hypermetabolic abdominal or pelvic lymph nodes. Incidental CT findings: Atherosclerotic calcification of the aorta. SKELETON: No focal hypermetabolic activity to suggest skeletal metastasis. Incidental CT findings: none IMPRESSION: 1. Splenomegaly with normal metabolic activity. 2. No hypermetabolic nodes in the chest, abdomen, or pelvis. No lymphadenopathy. Electronically Signed   By: Suzy Bouchard M.D.   On: 06/05/2020 10:44   US ABDOMEN COMPLETE W/ELASTOGRAPHY  Result Date: 06/14/2020 CLINICAL DATA:  Thrombocytopenia. EXAM: ULTRASOUND ABDOMEN ULTRASOUND HEPATIC ELASTOGRAPHY TECHNIQUE: Sonography of the upper abdomen was performed. In addition, ultrasound elastography evaluation of the liver was performed. A region of interest was placed within the right lobe of the liver. Following application of a compressive sonographic pulse, tissue compressibility was assessed. Multiple assessments were performed at the selected site. Median tissue compressibility was determined. Previously, hepatic stiffness was assessed by shear wave velocity. Based on recently published Society of Radiologists in Ultrasound consensus article, reporting is now recommended to be performed in the SI units of pressure (kiloPascals) representing hepatic stiffness/elasticity. The obtained result is compared to the published reference  standards. (cACLD = compensated Advanced Chronic Liver Disease) COMPARISON:  11/18/2018 FINDINGS: ULTRASOUND ABDOMEN  Gallbladder: No gallstones or wall thickening visualized. No sonographic Murphy sign noted by sonographer. Common bile duct: Diameter: 3.8 mm Liver: Increased parenchymal echogenicity. No focal liver abnormality. Portal vein is patent on color Doppler imaging with normal direction of blood flow towards the liver. IVC: No abnormality visualized. Pancreas: Visualized portion unremarkable. Spleen: The spleen measures 13.6 cm in length and has a volume of 610 cc. Right Kidney: Length: 10.7 cm. Echogenicity within normal limits. No mass or hydronephrosis visualized. Left Kidney: Length: 12.9 cm. Echogenicity within normal limits. No mass or hydronephrosis visualized. Parapelvic cyst within upper pole of the left kidney measures 5.3 x 3.1 x 3.1 cm Abdominal aorta: No aneurysm visualized. Other findings: None. ULTRASOUND HEPATIC ELASTOGRAPHY Device: Siemens Helix VTQ Patient position: Oblique Transducer 5C1 Number of measurements: 11 Hepatic segment:  8 Median kPa: 3.1 IQR: 0.3 IQR/Median kPa ratio: 0.1 Data quality:  Good Diagnostic category:  < or = 5 kPa: high probability of being normal The use of hepatic elastography is applicable to patients with viral hepatitis and non-alcoholic fatty liver disease. At this time, there is insufficient data for the referenced cut-off values and use in other causes of liver disease, including alcoholic liver disease. Patients, however, may be assessed by elastography and serve as their own reference standard/baseline. In patients with non-alcoholic liver disease, the values suggesting compensated advanced chronic liver disease (cACLD) may be lower, and patients may need additional testing with elasticity results of 7-9 kPa. Please note that abnormal hepatic elasticity and shear wave velocities may also be identified in clinical settings other than with hepatic fibrosis, such as: acute hepatitis, elevated right heart and central venous pressures including use of beta blockers,  veno-occlusive disease (Budd-Chiari), infiltrative processes such as mastocytosis/amyloidosis/infiltrative tumor/lymphoma, extrahepatic cholestasis, with hyperemia in the post-prandial state, and with liver transplantation. Correlation with patient history, laboratory data, and clinical condition recommended. Diagnostic Categories: < or =5 kPa: high probability of being normal < or =9 kPa: in the absence of other known clinical signs, rules out cACLD >9 kPa and ?13 kPa: suggestive of cACLD, but needs further testing >13 kPa: highly suggestive of cACLD > or =17 kPa: highly suggestive of cACLD with an increased probability of clinically significant portal hypertension IMPRESSION: ULTRASOUND ABDOMEN: 1. Echogenic liver compatible with hepatic steatosis. 2. Splenomegaly. ULTRASOUND HEPATIC ELASTOGRAPHY: Median kPa:  3.1 Diagnostic category:  < or = 5 kPa: high probability of being normal Electronically Signed   By: Kerby Moors M.D.   On: 06/14/2020 11:24    Labs:  CBC: Recent Labs    05/24/20 1017 05/29/20 1005 06/07/20 0928 06/26/20 0950  WBC 1.3* 1.7* 1.6* 1.9*  HGB 12.5* 13.1 13.3 13.2  HCT 38.2* 41.5 40.6 38.9*  PLT 34* 44* 61* PENDING    COAGS: Recent Labs    06/26/20 0950  INR 1.1    BMP: Recent Labs    01/18/20 1304 05/09/20 1048 05/22/20 1254 05/29/20 1006 06/07/20 0928  NA 137 137 135 137 136  K 4.3 3.8 3.5 4.1 4.4  CL 106 105 105 102 100  CO2 23 23 21* 25 25  GLUCOSE 115* 138* 123* 95 99  BUN 16 23 22  25* 23  CALCIUM 9.1 9.2 8.9 9.2 9.1  CREATININE 1.31* 1.46* 1.13 1.35* 1.42*  GFRNONAA 56* 52* >60 57* 54*  GFRAA >60  --   --   --   --  LIVER FUNCTION TESTS: Recent Labs    01/18/20 1304 05/09/20 1048 05/22/20 1254 06/07/20 0928  BILITOT 0.8 0.8 1.0 0.7  AST 27 27 61* 40  ALT 22 24 42 29  ALKPHOS 59 67 58 60  PROT 7.1 6.7 8.0 8.2*  ALBUMIN 4.4 4.1 3.7 4.3     Assessment and Plan:  70 y.o. male outpatient. Former smoker. History of CAD, HTN,  HLD, GERD, melanoma, AAA, immune thrombocytopenia IR performed a bone marrow biopsy on 9.29.20 due to thrombocytopenia. Diagnosis reads slightly hypercellular bone marrow for age with trilineage hematopoiesis predominately normal morhoplogy.  IR previously performed a bone marrow biopsy on 9.29.20. Microbiology shows slightly hypercellular bone marrow with trilineage hematopoiesis and mild leukopenia. Team is requesting a bone marrow biopsy for further evaluation of thrombocytopenia with new onset splenomegaly.  Patient is on plavix. labs from 1.6.22 show WBC is 1.6, PLT 61. All other labs and medications are within acceptable parameters. Allergies include contrast media reaction nausea. patient has been NPO since midnight.  Risks and benefits of bone marrow biopsy was discussed with the patient and/or patient's family including, but not limited to bleeding, infection, damage to adjacent structures or low yield requiring additional tests.  All of the questions were answered and there is agreement to proceed.  Consent signed and in chart.    Thank you for this interesting consult.  I greatly enjoyed meeting FARMER MCCAHILL and look forward to participating in their care.  A copy of this report was sent to the requesting provider on this date.  Electronically Signed: Jacqualine Mau, NP 06/26/2020, 10:17 AM   I spent a total of  30 Minutes   in face to face in clinical consultation, greater than 50% of which was counseling/coordinating care for bone marrow biopsy

## 2020-06-26 NOTE — Procedures (Signed)
Pre-procedure Diagnosis: Thrombocytopenia Post-procedure Diagnosis: Same  Technically successful CT guided bone marrow aspiration and biopsy of left iliac crest.   Complications: None Immediate  EBL: None  Signed: Sandi Mariscal Pager: [phone] 06/26/2020, 11:20 AM

## 2020-07-02 ENCOUNTER — Encounter (HOSPITAL_COMMUNITY): Payer: Self-pay | Admitting: Hematology

## 2020-07-02 DIAGNOSIS — I10 Essential (primary) hypertension: Secondary | ICD-10-CM | POA: Diagnosis not present

## 2020-07-02 DIAGNOSIS — E7849 Other hyperlipidemia: Secondary | ICD-10-CM | POA: Diagnosis not present

## 2020-07-03 LAB — SURGICAL PATHOLOGY

## 2020-07-05 ENCOUNTER — Encounter (HOSPITAL_COMMUNITY): Payer: Self-pay | Admitting: Hematology

## 2020-07-05 ENCOUNTER — Inpatient Hospital Stay (HOSPITAL_COMMUNITY): Payer: Medicare Other

## 2020-07-05 ENCOUNTER — Other Ambulatory Visit: Payer: Self-pay

## 2020-07-05 ENCOUNTER — Inpatient Hospital Stay (HOSPITAL_COMMUNITY): Payer: Medicare Other | Attending: Hematology | Admitting: Hematology

## 2020-07-05 VITALS — BP 122/80 | HR 67 | Temp 98.5°F | Resp 17 | Wt 240.4 lb

## 2020-07-05 DIAGNOSIS — K219 Gastro-esophageal reflux disease without esophagitis: Secondary | ICD-10-CM | POA: Diagnosis not present

## 2020-07-05 DIAGNOSIS — D72819 Decreased white blood cell count, unspecified: Secondary | ICD-10-CM | POA: Insufficient documentation

## 2020-07-05 DIAGNOSIS — K5900 Constipation, unspecified: Secondary | ICD-10-CM | POA: Insufficient documentation

## 2020-07-05 DIAGNOSIS — Z85820 Personal history of malignant melanoma of skin: Secondary | ICD-10-CM | POA: Diagnosis not present

## 2020-07-05 DIAGNOSIS — D696 Thrombocytopenia, unspecified: Secondary | ICD-10-CM

## 2020-07-05 DIAGNOSIS — R161 Splenomegaly, not elsewhere classified: Secondary | ICD-10-CM | POA: Insufficient documentation

## 2020-07-05 DIAGNOSIS — Z8249 Family history of ischemic heart disease and other diseases of the circulatory system: Secondary | ICD-10-CM | POA: Insufficient documentation

## 2020-07-05 DIAGNOSIS — Z808 Family history of malignant neoplasm of other organs or systems: Secondary | ICD-10-CM | POA: Insufficient documentation

## 2020-07-05 DIAGNOSIS — R5383 Other fatigue: Secondary | ICD-10-CM | POA: Insufficient documentation

## 2020-07-05 DIAGNOSIS — Z79899 Other long term (current) drug therapy: Secondary | ICD-10-CM | POA: Insufficient documentation

## 2020-07-05 DIAGNOSIS — Z87891 Personal history of nicotine dependence: Secondary | ICD-10-CM | POA: Diagnosis not present

## 2020-07-05 LAB — CBC WITH DIFFERENTIAL/PLATELET
Abs Immature Granulocytes: 0 10*3/uL (ref 0.00–0.07)
Basophils Absolute: 0 10*3/uL (ref 0.0–0.1)
Basophils Relative: 0 %
Eosinophils Absolute: 0 10*3/uL (ref 0.0–0.5)
Eosinophils Relative: 1 %
HCT: 41.1 % (ref 39.0–52.0)
Hemoglobin: 13.3 g/dL (ref 13.0–17.0)
Immature Granulocytes: 0 %
Lymphocytes Relative: 40 %
Lymphs Abs: 0.8 10*3/uL (ref 0.7–4.0)
MCH: 29.8 pg (ref 26.0–34.0)
MCHC: 32.4 g/dL (ref 30.0–36.0)
MCV: 91.9 fL (ref 80.0–100.0)
Monocytes Absolute: 0.2 10*3/uL (ref 0.1–1.0)
Monocytes Relative: 9 %
Neutro Abs: 1.1 10*3/uL — ABNORMAL LOW (ref 1.7–7.7)
Neutrophils Relative %: 50 %
Platelets: 66 10*3/uL — ABNORMAL LOW (ref 150–400)
RBC: 4.47 MIL/uL (ref 4.22–5.81)
RDW: 14.1 % (ref 11.5–15.5)
WBC: 2.1 10*3/uL — ABNORMAL LOW (ref 4.0–10.5)
nRBC: 0 % (ref 0.0–0.2)

## 2020-07-05 LAB — SURGICAL PATHOLOGY

## 2020-07-05 LAB — LACTATE DEHYDROGENASE: LDH: 135 U/L (ref 98–192)

## 2020-07-05 NOTE — Patient Instructions (Signed)
[facility] Discharge Instructions  You were seen today by Dr. Delton Coombes. He went over your recent results. Keep your appointment with Dr. Laural Golden in March. You will be referred to the [facility] group in [facility], Dr. Tona Sensing, to evaluate your enlarged spleen and possible lymphoma. Dr. Delton Coombes will see you back in 6 weeks for labs and follow up.   Thank you for choosing [place] at [facility] to provide your oncology and hematology care.  To afford each patient quality time with our provider, please arrive at least 15 minutes before your scheduled appointment time.   If you have a lab appointment with the [facility] please come in thru the Main Entrance and check in at the main information desk  You need to re-schedule your appointment should you arrive 10 or more minutes late.  We strive to give you quality time with our providers, and arriving late affects you and other patients whose appointments are after yours.  Also, if you no show three or more times for appointments you may be dismissed from the clinic at the providers discretion.     Again, thank you for choosing [facility].  Our hope is that these requests will decrease the amount of time that you wait before being seen by our physicians.       _____________________________________________________________  Should you have questions after your visit to [facility], please contact our office at [phone] between the hours of 8:00 a.m. and 4:30 p.m.  Voicemails left after 4:00 p.m. will not be returned until the following business day.  For prescription refill requests, have your pharmacy contact our office and allow 72 hours.    Cancer Center Support Programs:   > Cancer Support Group  2nd Tuesday of the month 1pm-2pm, Journey Room

## 2020-07-05 NOTE — Progress Notes (Signed)
[name], [name]   CLINIC:  Medical Oncology/Hematology  PCP:  [name], [name]  [phone]  REASON FOR VISIT:  Follow-up for thrombocytopenia  PRIOR THERAPY: Prednisone 60 mg in 05/2020  CURRENT THERAPY: Observation  INTERVAL HISTORY:  [name], a 70 y.o. male, returns for routine follow-up for his thrombocytopenia. [name] was last seen on 06/07/2020.  Today he is accompanied by his wife and he reports feeling okay. He complains of having constipation and is taking Reglan, which he says is not helping; he is also taking a stool softener BID and will take milk of magnesia if he has not a BM for several days.  He will see Dr. Laural Golden in March.   REVIEW OF SYSTEMS:  Review of Systems  Constitutional: Positive for fatigue (25%). Negative for appetite change.  Gastrointestinal: Positive for constipation.  All other systems reviewed and are negative.   PAST MEDICAL/SURGICAL HISTORY:  Past Medical History:  Diagnosis Date  . Anxiety    patient denies  . Aortic aneurysm (HCC)    ascending  . Arthritis   . Barrett esophagus   . Blood transfusion   . Cancer ([place]) 2007   melanoma stage 3 stomach bx  . Coronary artery disease    mild by 09/08/13 cath [facility]  . GERD (gastroesophageal reflux disease)    barretts esophagus  . H/O hiatal hernia   . Headache(784.0)   . Hypercholesteremia   . Hypertension   . PONV (postoperative nausea and vomiting)    trouble breathing after last surgery (at [facility])  . Shingles    10  . Sleep apnea    not tested  . Tumor liver    no problems   Past Surgical History:  Procedure Laterality Date  . ABDOMINAL SURGERY     x3  . back surgeries     x 9  . BACK SURGERY     x8  . BIOPSY N/A 03/28/2013   Procedure: Distal Esophageal Biopsy;  Surgeon: Rogene Houston, MD;  Location: [facility];  Service: Endoscopy;  Laterality: N/A;  . BIOPSY   05/30/2016   Procedure: BIOPSY;  Surgeon: Rogene Houston, MD;  [facility]: [facility];  Service: Endoscopy;;  . BIOPSY  02/19/2018   Procedure: BIOPSY;  Surgeon: Rogene Houston, MD;  [facility];  Service: Endoscopy;;  Barret's esophagus  . CHEST SURGERY     lung incisional hernia for hernia  . COLONOSCOPY WITH PROPOFOL N/A 02/19/2018   Procedure: COLONOSCOPY WITH PROPOFOL;  Surgeon: Rogene Houston, MD;  [facility];  Service: Endoscopy;  Laterality: N/A;  1:00  . ESOPHAGOGASTRODUODENOSCOPY (EGD) WITH PROPOFOL N/A 03/28/2013   Procedure: ESOPHAGOGASTRODUODENOSCOPY (EGD) WITH PROPOFOL;  Surgeon: Rogene Houston, MD;  Location: [facility];  Service: Endoscopy;  Laterality: N/A;  GE junction @ 38, proximal  margin _0   . ESOPHAGOGASTRODUODENOSCOPY (EGD) WITH PROPOFOL N/A 05/30/2016   Procedure: ESOPHAGOGASTRODUODENOSCOPY (EGD) WITH PROPOFOL;  Surgeon: Rogene Houston, MD;  [facility];  Service: Endoscopy;  Laterality: N/A;  955  . ESOPHAGOGASTRODUODENOSCOPY (EGD) WITH PROPOFOL N/A 02/19/2018   Procedure: ESOPHAGOGASTRODUODENOSCOPY (EGD) WITH PROPOFOL;  Surgeon: Rogene Houston, MD;  [facility];  Service: Endoscopy;  Laterality: N/A;  . HEMORRHOID SURGERY     x2  . HERNIA REPAIR    . HIATAL HERNIA REPAIR    . JOINT REPLACEMENT  lft knee  . KNEE ARTHROPLASTY     lft  . KNEE ARTHROSCOPY Right   . LEG SURGERY     x4 crushed   . LITHOTRIPSY    . MELANOMA EXCISION     lft abdomen  . NECK SURGERY  2012  . POLYPECTOMY  02/19/2018   Procedure: POLYPECTOMY;  Surgeon: Rogene Houston, MD;  [facility];  Service: Endoscopy;;  proximal transverse colon (CB x1), descending colon (CBx3)  . POSTERIOR CERVICAL FUSION/FORAMINOTOMY  08/18/2011   Procedure: POSTERIOR CERVICAL FUSION/FORAMINOTOMY LEVEL 3;  Surgeon: Ophelia Charter, MD;  Location: [place] [facility];  Service: Neurosurgery;  Laterality: N/A;  Cervical three-four, four-five, five-six  posterior cervical  fusion with instrumentation; Right cervical three-four, four-five laminotomy  . REVISION TOTAL HIP ARTHROPLASTY Left   . SHOULDER ARTHROSCOPY     rt bith defect , spur, rotator cuff  . SPINAL CORD STIMULATOR IMPLANT     rt hip  . SPINAL CORD STIMULATOR INSERTION N/A 11/03/2016   Procedure: LUMBAR SPINAL CORD STIMULATOR REVISION;  Surgeon: Newman Pies, MD;  Location: [facility];  Service: Neurosurgery;  Laterality: N/A;  . THYROID LOBECTOMY     rt    SOCIAL HISTORY:  Social History   Socioeconomic History  . Marital status: Married    Spouse name: Not on file  . Number of children: Not on file  . Years of education: Not on file  . Highest education level: Not on file  Occupational History  . Occupation: retired  Tobacco Use  . Smoking status: Former Smoker    Packs/day: 4.00    Years: 10.00    Pack years: 40.00    Types: Cigarettes    Quit date: 08/12/1980    Years since quitting: 39.9  . Smokeless tobacco: Never Used  . Tobacco comment: occ wine  Vaping Use  . Vaping Use: Never used  Substance and Sexual Activity  . Alcohol use: Yes    Alcohol/week: 0.0 standard drinks    Comment: rarely  . Drug use: No  . Sexual activity: Yes    Birth control/protection: None  Other Topics Concern  . Not on file  Social History Narrative  . Not on file   Social Determinants of Health   Financial Resource Strain: Low Risk   . Difficulty of Paying Living Expenses: Not hard at all  Food Insecurity: No Food Insecurity  . Worried About Charity fundraiser in the Last Year: Never true  . Ran Out of Food in the Last Year: Never true  Transportation Needs: No Transportation Needs  . Lack of Transportation (Medical): No  . Lack of Transportation (Non-Medical): No  Physical Activity: Inactive  . Days of Exercise per Week: 0 days  . Minutes of Exercise per Session: 0 min  Stress: No Stress Concern Present  . Feeling of Stress : Not at all  Social Connections:  Moderately Integrated  . Frequency of Communication with Friends and Family: More than three times a week  . Frequency of Social Gatherings with Friends and Family: Once a week  . Attends Religious Services: More than 4 times per year  . Active Member of Clubs or Organizations: No  . Attends Archivist Meetings: Never  . Marital Status: Married  Human resources officer Violence: Not At Risk  . Fear of Current or Ex-Partner: No  . Emotionally Abused: No  . Physically Abused: No  . Sexually Abused: No    FAMILY HISTORY:  Family History  Problem Relation Age of Onset  . Heart disease Father   . Aneurysm Mother   . Aneurysm Sister   . Heart disease Paternal Grandmother   . Heart disease Paternal Grandfather   . Heart disease Maternal Grandfather   . Heart disease Brother        heart transplant  . Aneurysm Other        pt states several members have aortic aneurysm  . Skin cancer Other        several members  . Lymphoma Brother   . Aneurysm Maternal Grandmother     CURRENT MEDICATIONS:  Current Outpatient Medications  Medication Sig Dispense Refill  . Aspirin-Acetaminophen-Caffeine (GOODY HEADACHE PO) Take 1-2 packets by mouth daily as needed (headaches).    Marland Kitchen Bioflavonoid Products (ESTER C PO) Take 1 tablet by mouth daily.     Marland Kitchen Bioflavonoid Products (ESTER-C) 500-550 MG TABS Take 1 tablet by mouth daily.    . calcium-vitamin D (OSCAL WITH D) 500-200 MG-UNIT per tablet Take 1 tablet by mouth 2 (two) times daily.     . cyclobenzaprine (FLEXERIL) 10 MG tablet Take 1 tablet (10 mg total) by mouth 3 (three) times daily as needed for muscle spasms. 50 tablet 1  . dexlansoprazole (DEXILANT) 60 MG capsule Take 60 mg by mouth daily.     . diazepam (VALIUM) 10 MG tablet Take 10 mg by mouth 4 (four) times daily as needed (for back pain).     . fexofenadine (ALLEGRA) 180 MG tablet Take 180 mg by mouth every evening.    . fexofenadine (ALLEGRA) 180 MG tablet Take by mouth.    . fish  oil-omega-3 fatty acids 1000 MG capsule Take 1 g by mouth 2 (two) times daily.    Marland Kitchen lisinopril (PRINIVIL,ZESTRIL) 10 MG tablet Take 10 mg by mouth daily.    . metoCLOPramide (REGLAN) 10 MG tablet TAKE ONE TABLET BY MOUTH TWICE A DAY AS NEEDED FOR NAUSEA 60 tablet 2  . montelukast (SINGULAIR) 10 MG tablet Take 10 mg by mouth daily.     . Multiple Vitamin (MULITIVITAMIN WITH MINERALS) TABS Take 1 tablet by mouth daily.    . ondansetron (ZOFRAN) 8 MG tablet Take 1 tablet (8 mg total) by mouth every 8 (eight) hours as needed for nausea or vomiting. 60 tablet 3  . oxyCODONE-acetaminophen (PERCOCET) 10-325 MG tablet Take 1 tablet by mouth every 6 (six) hours as needed for pain.    . predniSONE (DELTASONE) 20 MG tablet Take 3 tablets (60 mg total) by mouth daily with breakfast. 30 tablet 0  . ranolazine (RANEXA) 500 MG 12 hr tablet Take 500 mg by mouth 2 (two) times daily.    . rosuvastatin (CRESTOR) 10 MG tablet Take 10 mg by mouth every evening.     . sildenafil (VIAGRA) 100 MG tablet TAKE ONE-HALF TABLET BY MOUTH AS INSTRUCTED (TAKE 1 HOUR PRIOR TO SEXUAL ACTIVITY *DO NOT EXCEED 1 DOSE PER 24 HOUR PERIOD*)     No current facility-administered medications for this visit.    ALLERGIES:  Allergies  Allergen Reactions  . Demerol Other (See Comments)    "Makes me crazy."  . Contrast Media [Iodinated Diagnostic Agents] Nausea Only    PHYSICAL EXAM:  Performance status (ECOG): 1 - Symptomatic but completely ambulatory  Vitals:   07/05/20 1452  BP: 122/80  Pulse: 67  Resp: 17  Temp: 98.5 F (36.9 C)  SpO2: 99%   Wt Readings from Last 3 Encounters:  07/05/20 240 lb  7 oz (109.1 kg)  06/07/20 245 lb (111.1 kg)  05/22/20 255 lb 4.7 oz (115.8 kg)   Physical Exam Vitals reviewed.  Constitutional:      Appearance: Normal appearance. He is obese.  Cardiovascular:     Rate and Rhythm: Normal rate and regular rhythm.     Pulses: Normal pulses.     Heart sounds: Normal heart sounds.   Pulmonary:     Effort: Pulmonary effort is normal.     Breath sounds: Normal breath sounds.  Abdominal:     Palpations: Abdomen is soft. There is splenomegaly. There is no mass.     Tenderness: There is no abdominal tenderness.  Neurological:     General: No focal deficit present.     Mental Status: He is alert and oriented to person, place, and time.  Psychiatric:        Mood and Affect: Mood normal.        Behavior: Behavior normal.     LABORATORY DATA:  I have reviewed the labs as listed.  CBC Latest Ref Rng & Units 07/05/2020 06/26/2020 06/07/2020  WBC 4.0 - 10.5 K/uL 2.1(L) 1.9(L) 1.6(L)  Hemoglobin 13.0 - 17.0 g/dL 13.3 13.2 13.3  Hematocrit 39.0 - 52.0 % 41.1 38.9(L) 40.6  Platelets 150 - 400 K/uL 66(L) 67(L) 61(L)   CMP Latest Ref Rng & Units 06/07/2020 05/29/2020 05/22/2020  Glucose 70 - 99 mg/dL 99 95 123(H)  BUN 8 - 23 mg/dL 23 25(H) 22  Creatinine 0.61 - 1.24 mg/dL 1.42(H) 1.35(H) 1.13  Sodium 135 - 145 mmol/L 136 137 135  Potassium 3.5 - 5.1 mmol/L 4.4 4.1 3.5  Chloride 98 - 111 mmol/L 100 102 105  CO2 22 - 32 mmol/L 25 25 21(L)  Calcium 8.9 - 10.3 mg/dL 9.1 9.2 8.9  Total Protein 6.5 - 8.1 g/dL 8.2(H) - 8.0  Total Bilirubin 0.3 - 1.2 mg/dL 0.7 - 1.0  Alkaline Phos 38 - 126 U/L 60 - 58  AST 15 - 41 U/L 40 - 61(H)  ALT 0 - 44 U/L 29 - 42      Component Value Date/Time   RBC 4.47 07/05/2020 1252   MCV 91.9 07/05/2020 1252   MCH 29.8 07/05/2020 1252   MCHC 32.4 07/05/2020 1252   RDW 14.1 07/05/2020 1252   LYMPHSABS 0.8 07/05/2020 1252   MONOABS 0.2 07/05/2020 1252   EOSABS 0.0 07/05/2020 1252   BASOSABS 0.0 07/05/2020 1252   Lab Results  Component Value Date   LDH 135 07/05/2020   LDH 205 (H) 06/07/2020   LDH 198 (H) 05/22/2020    DIAGNOSTIC IMAGING:  I have independently reviewed the scans and discussed with the patient. CT Biopsy  Result Date: 06/26/2020 INDICATION: Thrombocytopenia of uncertain etiology. Please perform CT-guided bone marrow biopsy  for tissue diagnostic purposes. EXAM: CT-GUIDED BONE MARROW BIOPSY AND ASPIRATION MEDICATIONS: None ANESTHESIA/SEDATION: Fentanyl 100 mcg IV; Versed 4 mg IV Sedation Time: 10 Minutes; The patient was continuously monitored during the procedure by the interventional radiology nurse under my direct supervision. COMPLICATIONS: None immediate. PROCEDURE: Informed consent was obtained from the patient following an explanation of the procedure, risks, benefits and alternatives. The patient understands, agrees and consents for the procedure. All questions were addressed. A time out was performed prior to the initiation of the procedure. The patient was positioned prone and non-contrast localization CT was performed of the pelvis to demonstrate the iliac marrow spaces. The operative site was prepped and draped in the usual sterile fashion.  Under sterile conditions and local anesthesia, a 22 gauge spinal needle was utilized for procedural planning. Next, an 11 gauge coaxial bone biopsy needle was advanced into the left iliac marrow space. Needle position was confirmed with CT imaging. Initially, a bone marrow aspiration was performed. Next, a bone marrow biopsy was obtained with the 11 gauge outer bone marrow device. The 11 gauge coaxial bone biopsy needle was re-advanced into a slightly different location within the left iliac marrow space, positioning was confirmed with CT imaging and an additional bone marrow biopsy was obtained. Samples were prepared with the cytotechnologist and deemed adequate. The needle was removed and superficial hemostasis was obtained with manual compression. A dressing was applied. The patient tolerated the procedure well without immediate post procedural complication. IMPRESSION: Successful CT guided left iliac bone marrow aspiration and core biopsy. Electronically Signed   By: Sandi Mariscal M.D.   On: 06/26/2020 12:03   CT BONE MARROW BIOPSY & ASPIRATION  Result Date: 06/26/2020 INDICATION:  Thrombocytopenia of uncertain etiology. Please perform CT-guided bone marrow biopsy for tissue diagnostic purposes. EXAM: CT-GUIDED BONE MARROW BIOPSY AND ASPIRATION MEDICATIONS: None ANESTHESIA/SEDATION: Fentanyl 100 mcg IV; Versed 4 mg IV Sedation Time: 10 Minutes; The patient was continuously monitored during the procedure by the interventional radiology nurse under my direct supervision. COMPLICATIONS: None immediate. PROCEDURE: Informed consent was obtained from the patient following an explanation of the procedure, risks, benefits and alternatives. The patient understands, agrees and consents for the procedure. All questions were addressed. A time out was performed prior to the initiation of the procedure. The patient was positioned prone and non-contrast localization CT was performed of the pelvis to demonstrate the iliac marrow spaces. The operative site was prepped and draped in the usual sterile fashion. Under sterile conditions and local anesthesia, a 22 gauge spinal needle was utilized for procedural planning. Next, an 11 gauge coaxial bone biopsy needle was advanced into the left iliac marrow space. Needle position was confirmed with CT imaging. Initially, a bone marrow aspiration was performed. Next, a bone marrow biopsy was obtained with the 11 gauge outer bone marrow device. The 11 gauge coaxial bone biopsy needle was re-advanced into a slightly different location within the left iliac marrow space, positioning was confirmed with CT imaging and an additional bone marrow biopsy was obtained. Samples were prepared with the cytotechnologist and deemed adequate. The needle was removed and superficial hemostasis was obtained with manual compression. A dressing was applied. The patient tolerated the procedure well without immediate post procedural complication. IMPRESSION: Successful CT guided left iliac bone marrow aspiration and core biopsy. Electronically Signed   By: Sandi Mariscal M.D.   On: 06/26/2020  12:03   US ABDOMEN COMPLETE W/ELASTOGRAPHY  Result Date: 06/14/2020 CLINICAL DATA:  Thrombocytopenia. EXAM: ULTRASOUND ABDOMEN ULTRASOUND HEPATIC ELASTOGRAPHY TECHNIQUE: Sonography of the upper abdomen was performed. In addition, ultrasound elastography evaluation of the liver was performed. A region of interest was placed within the right lobe of the liver. Following application of a compressive sonographic pulse, tissue compressibility was assessed. Multiple assessments were performed at the selected site. Median tissue compressibility was determined. Previously, hepatic stiffness was assessed by shear wave velocity. Based on recently published Society of Radiologists in Ultrasound consensus article, reporting is now recommended to be performed in the SI units of pressure (kiloPascals) representing hepatic stiffness/elasticity. The obtained result is compared to the published reference standards. (cACLD = compensated Advanced Chronic Liver Disease) COMPARISON:  11/18/2018 FINDINGS: ULTRASOUND ABDOMEN Gallbladder: No gallstones or wall  thickening visualized. No sonographic Murphy sign noted by sonographer. Common bile duct: Diameter: 3.8 mm Liver: Increased parenchymal echogenicity. No focal liver abnormality. Portal vein is patent on color Doppler imaging with normal direction of blood flow towards the liver. IVC: No abnormality visualized. Pancreas: Visualized portion unremarkable. Spleen: The spleen measures 13.6 cm in length and has a volume of 610 cc. Right Kidney: Length: 10.7 cm. Echogenicity within normal limits. No mass or hydronephrosis visualized. Left Kidney: Length: 12.9 cm. Echogenicity within normal limits. No mass or hydronephrosis visualized. Parapelvic cyst within upper pole of the left kidney measures 5.3 x 3.1 x 3.1 cm Abdominal aorta: No aneurysm visualized. Other findings: None. ULTRASOUND HEPATIC ELASTOGRAPHY Device: Siemens Helix VTQ Patient position: Oblique Transducer 5C1 Number of  measurements: 11 Hepatic segment:  8 Median kPa: 3.1 IQR: 0.3 IQR/Median kPa ratio: 0.1 Data quality:  Good Diagnostic category:  < or = 5 kPa: high probability of being normal The use of hepatic elastography is applicable to patients with viral hepatitis and non-alcoholic fatty liver disease. At this time, there is insufficient data for the referenced cut-off values and use in other causes of liver disease, including alcoholic liver disease. Patients, however, may be assessed by elastography and serve as their own reference standard/baseline. In patients with non-alcoholic liver disease, the values suggesting compensated advanced chronic liver disease (cACLD) may be lower, and patients may need additional testing with elasticity results of 7-9 kPa. Please note that abnormal hepatic elasticity and shear wave velocities may also be identified in clinical settings other than with hepatic fibrosis, such as: acute hepatitis, elevated right heart and central venous pressures including use of beta blockers, veno-occlusive disease (Budd-Chiari), infiltrative processes such as mastocytosis/amyloidosis/infiltrative tumor/lymphoma, extrahepatic cholestasis, with hyperemia in the post-prandial state, and with liver transplantation. Correlation with patient history, laboratory data, and clinical condition recommended. Diagnostic Categories: < or =5 kPa: high probability of being normal < or =9 kPa: in the absence of other known clinical signs, rules out cACLD >9 kPa and ?13 kPa: suggestive of cACLD, but needs further testing >13 kPa: highly suggestive of cACLD > or =17 kPa: highly suggestive of cACLD with an increased probability of clinically significant portal hypertension IMPRESSION: ULTRASOUND ABDOMEN: 1. Echogenic liver compatible with hepatic steatosis. 2. Splenomegaly. ULTRASOUND HEPATIC ELASTOGRAPHY: Median kPa:  3.1 Diagnostic category:  < or = 5 kPa: high probability of being normal Electronically Signed   By: Kerby Moors M.D.   On: 06/14/2020 11:24     ASSESSMENT:  1. Moderate thrombocytopenia: -Seen as referral from Dr. Nolon Rod office for decreased platelet count of 80 on CBC on 10/21/2018. -Ex-smoker, quit in 1982, smoked 1 to 2 packs/day for 10 years. He also spent 2 years at [facility]. -CT abdomen on 11/08/2018 showed normal size spleen with no abdominal or pelvic pathology. -BMBX on 03/01/2019 showed slightly hypercellular marrow for age with trilineage hematopoiesis including abundant megakaryocytes and predominantly normal morphology. Overall myeloid changes are nonspecific and not diagnostic for MDS. Chromosome analysis was normal. MDS FISH panel was normal. Core biopsy showed small lymphoid aggregates, features are not considered specific or diagnostic of lymphoproliferative disorder. -He was treated with pulsed dexamethasone around the 01/27/2020 with improvement of platelet count to 70 K.  He could not tolerate dexamethasone due to sleeplessness. -He was also treated with prednisone 60 mg daily on 05/22/2020.  He took it only for 2 to 3 days and stopped it due to inability to sleep. -Treated with immunoglobulin from 05/14/2020 through 05/18/2020. -  PET scan on 06/05/2020 view shows no hypermetabolic lymph nodes in the chest, abdomen or pelvis.  Splenomegaly measuring 18 cm with normal metabolic activity. -Bone marrow biopsy on 06/26/2020 with mildly hypercellular marrow, no significant dysplasia.  Scattered large lymphoid aggregates with a predominance of B cells.  Flow cytometry was nondiagnostic.  There is no expression of CD5 or CD10 on the B cells. 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  However no clonality is demonstrated in the kappa light chain gene (IGK) -Myeloma FISH panel was negative.  Cytogenetics was 72, XY.  2. Malignant melanoma: -Melanoma resected on the left side of the abdomen with left axillary  sentinel lymph node biopsy at [facility] 14 years ago. Reportedly stage III.   PLAN:  1. Moderate thrombocytopenia: -Repeat platelet count today 60 6K. -Reviewed bone marrow biopsy on 06/26/2020, mildly hypercellular marrow with no significant dysplasia.  Scattered large lymphoid aggregates with predominance of B cells.  This was not diagnostic for any lymphoproliferative disorder. -Etiology of thrombocytopenia and leukopenia is likely splenomegaly. -However bone marrow biopsy has lymphoid aggregates, not leading up to any lymphoproliferative disorder.  B-cell gene rearrangement is positive.  Hence I have recommended second opinion consultation from [place] team, Dr. Tona Sensing at [facility]. -I will see him back in approximately 6 weeks.  2. Mild leukopenia: -CBC on 07/05/2020 with white count 2.1, ANC 1.1.  LDH normal.  3.  Massive splenomegaly: -Spleen measures 18 cm with no increased activity on the PET scan. -Ultrasound of the liver shows fatty liver.  Hepatitis B and C serology negative. -We will reach out to Dr. Laural Golden to see if any further recommendations for work-up of underlying liver disease.  Orders placed this encounter:  No orders of the defined types were placed in this encounter.    Ruben Jack, MD [place] [phone]   I, Milinda Antis, am acting as a scribe for Dr. Sanda Linger.  I, Ruben Jack MD, have reviewed the above documentation for accuracy and completeness, and I agree with the above.

## 2020-07-06 ENCOUNTER — Encounter (HOSPITAL_COMMUNITY): Payer: Self-pay | Admitting: Lab

## 2020-07-06 NOTE — Progress Notes (Unsigned)
Referral sent to DR Tona Sensing @[facility] .  I spoke with [name] there and they will review records and contact pt with appt.  Records faxed on 2/4

## 2020-07-19 DIAGNOSIS — I712 Thoracic aortic aneurysm, without rupture: Secondary | ICD-10-CM | POA: Diagnosis not present

## 2020-07-19 DIAGNOSIS — I2510 Atherosclerotic heart disease of native coronary artery without angina pectoris: Secondary | ICD-10-CM | POA: Diagnosis not present

## 2020-07-19 DIAGNOSIS — E785 Hyperlipidemia, unspecified: Secondary | ICD-10-CM | POA: Diagnosis not present

## 2020-07-19 DIAGNOSIS — I10 Essential (primary) hypertension: Secondary | ICD-10-CM | POA: Diagnosis not present

## 2020-07-27 DIAGNOSIS — K219 Gastro-esophageal reflux disease without esophagitis: Secondary | ICD-10-CM | POA: Diagnosis not present

## 2020-07-27 DIAGNOSIS — I10 Essential (primary) hypertension: Secondary | ICD-10-CM | POA: Diagnosis not present

## 2020-07-27 DIAGNOSIS — E7849 Other hyperlipidemia: Secondary | ICD-10-CM | POA: Diagnosis not present

## 2020-07-27 DIAGNOSIS — Z0001 Encounter for general adult medical examination with abnormal findings: Secondary | ICD-10-CM | POA: Diagnosis not present

## 2020-07-27 DIAGNOSIS — G8929 Other chronic pain: Secondary | ICD-10-CM | POA: Diagnosis not present

## 2020-07-27 DIAGNOSIS — E782 Mixed hyperlipidemia: Secondary | ICD-10-CM | POA: Diagnosis not present

## 2020-07-27 DIAGNOSIS — Z1389 Encounter for screening for other disorder: Secondary | ICD-10-CM | POA: Diagnosis not present

## 2020-07-31 DIAGNOSIS — D696 Thrombocytopenia, unspecified: Secondary | ICD-10-CM | POA: Diagnosis not present

## 2020-07-31 DIAGNOSIS — D7589 Other specified diseases of blood and blood-forming organs: Secondary | ICD-10-CM | POA: Diagnosis not present

## 2020-07-31 DIAGNOSIS — C8590 Non-Hodgkin lymphoma, unspecified, unspecified site: Secondary | ICD-10-CM | POA: Diagnosis not present

## 2020-07-31 DIAGNOSIS — C8519 Unspecified B-cell lymphoma, extranodal and solid organ sites: Secondary | ICD-10-CM | POA: Diagnosis not present

## 2020-07-31 DIAGNOSIS — D72819 Decreased white blood cell count, unspecified: Secondary | ICD-10-CM | POA: Diagnosis not present

## 2020-08-08 DIAGNOSIS — I10 Essential (primary) hypertension: Secondary | ICD-10-CM | POA: Diagnosis not present

## 2020-08-08 DIAGNOSIS — Z955 Presence of coronary angioplasty implant and graft: Secondary | ICD-10-CM | POA: Diagnosis not present

## 2020-08-08 DIAGNOSIS — D696 Thrombocytopenia, unspecified: Secondary | ICD-10-CM | POA: Diagnosis not present

## 2020-08-08 DIAGNOSIS — I2510 Atherosclerotic heart disease of native coronary artery without angina pectoris: Secondary | ICD-10-CM | POA: Diagnosis not present

## 2020-08-08 DIAGNOSIS — C8307 Small cell B-cell lymphoma, spleen: Secondary | ICD-10-CM | POA: Diagnosis not present

## 2020-08-08 DIAGNOSIS — I712 Thoracic aortic aneurysm, without rupture: Secondary | ICD-10-CM | POA: Diagnosis not present

## 2020-08-08 DIAGNOSIS — Z85820 Personal history of malignant melanoma of skin: Secondary | ICD-10-CM | POA: Diagnosis not present

## 2020-08-08 DIAGNOSIS — C8519 Unspecified B-cell lymphoma, extranodal and solid organ sites: Secondary | ICD-10-CM | POA: Diagnosis not present

## 2020-08-08 DIAGNOSIS — Z87891 Personal history of nicotine dependence: Secondary | ICD-10-CM | POA: Diagnosis not present

## 2020-08-08 DIAGNOSIS — E785 Hyperlipidemia, unspecified: Secondary | ICD-10-CM | POA: Diagnosis not present

## 2020-08-14 ENCOUNTER — Other Ambulatory Visit: Payer: Self-pay

## 2020-08-14 ENCOUNTER — Ambulatory Visit (INDEPENDENT_AMBULATORY_CARE_PROVIDER_SITE_OTHER): Payer: Medicare Other | Admitting: Internal Medicine

## 2020-08-14 ENCOUNTER — Other Ambulatory Visit (INDEPENDENT_AMBULATORY_CARE_PROVIDER_SITE_OTHER): Payer: Self-pay

## 2020-08-14 ENCOUNTER — Encounter (INDEPENDENT_AMBULATORY_CARE_PROVIDER_SITE_OTHER): Payer: Self-pay | Admitting: Internal Medicine

## 2020-08-14 VITALS — BP 116/73 | HR 73 | Temp 98.3°F | Ht 72.0 in | Wt 237.0 lb

## 2020-08-14 DIAGNOSIS — K219 Gastro-esophageal reflux disease without esophagitis: Secondary | ICD-10-CM | POA: Diagnosis not present

## 2020-08-14 DIAGNOSIS — R112 Nausea with vomiting, unspecified: Secondary | ICD-10-CM

## 2020-08-14 DIAGNOSIS — K760 Fatty (change of) liver, not elsewhere classified: Secondary | ICD-10-CM | POA: Diagnosis not present

## 2020-08-14 DIAGNOSIS — K3184 Gastroparesis: Secondary | ICD-10-CM

## 2020-08-14 MED ORDER — MOTEGRITY 2 MG PO TABS: 2.0000 mg | ORAL_TABLET | Freq: Every day | ORAL | 5 refills | Status: DC

## 2020-08-14 NOTE — Patient Instructions (Signed)
Esophagogastroduodenoscopy to be scheduled in near future

## 2020-08-14 NOTE — Progress Notes (Unsigned)
Presenting complaint;  Follow for chronic GERD and gastroparesis.  Database and subjective:  Patient is 70 year old Caucasian male who is here for scheduled visit accompanied by his wife Manuela Schwartz.  He was last seen on 11/22/2019. He has chronic GERD complicated by short segment Barrett's esophagus(biopsy negative in September 2019), gastroparesis and history of irregular bowel movements.  He used to have diarrhea and now he is constipated.  He also has fatty liver and elastography in June 2020 and in January, January 2022 revealing  kPa score of 3.1. He was evaluated by Dr. Delton Coombes for thrombocytopenia and leukopenia and bone marrow suggested lymphoma.  He was seen at [facility] by Dr. Burna Cash of oncology for second opinion and confirmed to have lymphoma.  Patient states he will have PICC line in the next 3 weeks and then he would get started on therapy. He complains of 1-2 episodes of heartburn every week.  He also has frequent nausea gagging and sporadic vomiting.  Usually he vomits liquid but occasionally he may vomit food particles.  He denies hematemesis or melena. He has lost 6 pounds since his last visit. He has chronic back pain but does not take oxycodone often. He is taking aspirin almost daily. He also is worried about potential side effects with metoclopramide.  He is only taking 10 mg twice a day.  He is not having any side effects but wants to know if he has other options.  He did try domperidone on but it did not help.  His last EGD and colonoscopy were in September 2019. As noted above EGD did not reveal endoscopic changes of Barrett's mucosa and biopsy was negative. Colonoscopy revealed pancolonic diverticulosis and 4 small polyps were tubular adenomas. Patient wonders if he should have colonoscopy prior to initiating therapy for lymphoma. He denies fever chills or night sweats.  Current Medications: Outpatient Encounter Medications as of 08/14/2020  Medication Sig  .  Aspirin-Acetaminophen-Caffeine (GOODY HEADACHE PO) Take 1-2 packets by mouth daily as needed (headaches).  Marland Kitchen Bioflavonoid Products (ESTER-C) 500-550 MG TABS Take 1 tablet by mouth daily.  . calcium-vitamin D (OSCAL WITH D) 500-200 MG-UNIT per tablet Take 1 tablet by mouth 2 (two) times daily.   . cyclobenzaprine (FLEXERIL) 10 MG tablet Take 1 tablet (10 mg total) by mouth 3 (three) times daily as needed for muscle spasms.  Marland Kitchen dexlansoprazole (DEXILANT) 60 MG capsule Take 60 mg by mouth daily.   . diazepam (VALIUM) 10 MG tablet Take 10 mg by mouth 4 (four) times daily as needed (for back pain).   . fexofenadine (ALLEGRA) 180 MG tablet Take 180 mg by mouth every evening.  . fish oil-omega-3 fatty acids 1000 MG capsule Take 1 g by mouth 2 (two) times daily.  Marland Kitchen lisinopril (PRINIVIL,ZESTRIL) 10 MG tablet Take 10 mg by mouth daily.  . metoCLOPramide (REGLAN) 10 MG tablet TAKE ONE TABLET BY MOUTH TWICE A DAY AS NEEDED FOR NAUSEA  . montelukast (SINGULAIR) 10 MG tablet Take 10 mg by mouth daily.   . Multiple Vitamin (MULITIVITAMIN WITH MINERALS) TABS Take 1 tablet by mouth daily.  . ondansetron (ZOFRAN) 8 MG tablet Take 1 tablet (8 mg total) by mouth every 8 (eight) hours as needed for nausea or vomiting.  Marland Kitchen oxyCODONE-acetaminophen (PERCOCET) 10-325 MG tablet Take 1 tablet by mouth every 6 (six) hours as needed for pain.  . ranolazine (RANEXA) 500 MG 12 hr tablet Take 500 mg by mouth 2 (two) times daily.  . rosuvastatin (CRESTOR) 10 MG tablet  Take 10 mg by mouth every evening.   . sildenafil (VIAGRA) 100 MG tablet TAKE ONE-HALF TABLET BY MOUTH AS INSTRUCTED (TAKE 1 HOUR PRIOR TO SEXUAL ACTIVITY *DO NOT EXCEED 1 DOSE PER 24 HOUR PERIOD*)  . [DISCONTINUED] Bioflavonoid Products (ESTER C PO) Take 1 tablet by mouth daily.   . [DISCONTINUED] fexofenadine (ALLEGRA) 180 MG tablet Take by mouth.  . [DISCONTINUED] predniSONE (DELTASONE) 20 MG tablet Take 3 tablets (60 mg total) by mouth daily with breakfast.  (Patient not taking: Reported on 08/14/2020)   No facility-administered encounter medications on file as of 08/14/2020.     Objective: Blood pressure 116/73, pulse 73, temperature 98.3 F (36.8 C), temperature source Oral, height 6' (1.829 m), weight 237 lb (107.5 kg). Patient is alert and in no acute distress. Conjunctiva is pink. Sclera is nonicteric Oropharyngeal mucosa is normal. No neck masses or thyromegaly noted. Cardiac exam with regular rhythm normal S1 and S2. No murmur or gallop noted. Lungs are clear to auscultation. Abdomen is full.  He has upper midline and horizontal scar in left upper quadrant of abdomen.  Bowel sounds are normal.  On palpation abdomen is soft and nontender.  Spleen is not palpable(he has splenomegaly).  Liver edge is indistinct.  By percussion it is not enlarged. No LE edema or clubbing noted.  Labs/studies Results:  CBC Latest Ref Rng & Units 07/05/2020 06/26/2020 06/07/2020  WBC 4.0 - 10.5 K/uL 2.1(L) 1.9(L) 1.6(L)  Hemoglobin 13.0 - 17.0 g/dL 13.3 13.2 13.3  Hematocrit 39.0 - 52.0 % 41.1 38.9(L) 40.6  Platelets 150 - 400 K/uL 66(L) 67(L) 61(L)    CMP Latest Ref Rng & Units 06/07/2020 05/29/2020 05/22/2020  Glucose 70 - 99 mg/dL 99 95 123(H)  BUN 8 - 23 mg/dL 23 25(H) 22  Creatinine 0.61 - 1.24 mg/dL 1.42(H) 1.35(H) 1.13  Sodium 135 - 145 mmol/L 136 137 135  Potassium 3.5 - 5.1 mmol/L 4.4 4.1 3.5  Chloride 98 - 111 mmol/L 100 102 105  CO2 22 - 32 mmol/L 25 25 21(L)  Calcium 8.9 - 10.3 mg/dL 9.1 9.2 8.9  Total Protein 6.5 - 8.1 g/dL 8.2(H) - 8.0  Total Bilirubin 0.3 - 1.2 mg/dL 0.7 - 1.0  Alkaline Phos 38 - 126 U/L 60 - 58  AST 15 - 41 U/L 40 - 61(H)  ALT 0 - 44 U/L 29 - 42    Hepatic Function Latest Ref Rng & Units 06/07/2020 05/22/2020 05/09/2020  Total Protein 6.5 - 8.1 g/dL 8.2(H) 8.0 6.7  Albumin 3.5 - 5.0 g/dL 4.3 3.7 4.1  AST 15 - 41 U/L 40 61(H) 27  ALT 0 - 44 U/L 29 42 24  Alk Phosphatase 38 - 126 U/L 60 58 67  Total Bilirubin 0.3 - 1.2  mg/dL 0.7 1.0 0.8    PET scan on 06/04/2020 Revealed splenomegaly but no abnormal metabolic activity  Abdominal ultrasound elastography on 06/14/2020 Echogenic liver consistent with hepatic steatosis but no contour changes to suggest cirrhosis. Spleen measured 13.6 cm.   Assessment:  #1.  GERD and gastroparesis.  His symptoms are not well controlled with current therapy which includes PPI low-dose promotility agent.  He is having 1-2 episodes of heartburn every week as well as nausea gagging and vomiting.  Therefore endoscopic evaluation of upper GI tract is warranted before he gets on therapy for lymphoma. I agree he should come off metoclopramide and in the meantime we will try him on prucalopride which is FDA approved for constipation but it also  enhances gastric motility.  #2.  Fatty liver.  Ultrasound elastography do not reveal any changes to suggest cirrhosis.  Even then he could have significant fibrosis but he seems to have preserved hepatic function.  Dr. Delton Coombes does not feel that plan therapy would have any serious effect on hepatic function.  Therefore may not pursue with further work-up unless he is found to have esophageal varices.  #3.  History of colonic adenomas.  Last colonoscopy was in September 2019.  He had 4 small polyps.  Therefore he could wait until September 2024 before his next exam.  #4.  Chronic constipation.  Plan:  Discontinue metoclopramide. Begin Motegrity/prucalopride 2 mg by mouth every morning.  Prescription given for 30 with 5 refills. Diagnostic esophagogastroduodenoscopy in near future under monitored anesthesia care. Office visit in 4 months.

## 2020-08-15 ENCOUNTER — Telehealth (INDEPENDENT_AMBULATORY_CARE_PROVIDER_SITE_OTHER): Payer: Self-pay

## 2020-08-15 ENCOUNTER — Encounter (INDEPENDENT_AMBULATORY_CARE_PROVIDER_SITE_OTHER): Payer: Self-pay

## 2020-08-15 DIAGNOSIS — Z1211 Encounter for screening for malignant neoplasm of colon: Secondary | ICD-10-CM

## 2020-08-15 MED ORDER — SUPREP BOWEL PREP KIT 17.5-3.13-1.6 GM/177ML PO SOLN: 1.0000 | Freq: Once | ORAL | 0 refills | Status: AC

## 2020-08-15 NOTE — Telephone Encounter (Signed)
[name], CMA  

## 2020-08-16 ENCOUNTER — Inpatient Hospital Stay (HOSPITAL_COMMUNITY): Payer: Medicare Other | Attending: Hematology and Oncology | Admitting: Hematology

## 2020-08-16 VITALS — BP 118/77 | HR 88 | Temp 97.9°F | Resp 17 | Wt 244.0 lb

## 2020-08-16 DIAGNOSIS — R0602 Shortness of breath: Secondary | ICD-10-CM | POA: Insufficient documentation

## 2020-08-16 DIAGNOSIS — Z808 Family history of malignant neoplasm of other organs or systems: Secondary | ICD-10-CM | POA: Insufficient documentation

## 2020-08-16 DIAGNOSIS — M545 Low back pain, unspecified: Secondary | ICD-10-CM | POA: Insufficient documentation

## 2020-08-16 DIAGNOSIS — Z79899 Other long term (current) drug therapy: Secondary | ICD-10-CM | POA: Insufficient documentation

## 2020-08-16 DIAGNOSIS — K219 Gastro-esophageal reflux disease without esophagitis: Secondary | ICD-10-CM | POA: Insufficient documentation

## 2020-08-16 DIAGNOSIS — M62830 Muscle spasm of back: Secondary | ICD-10-CM | POA: Insufficient documentation

## 2020-08-16 DIAGNOSIS — D72819 Decreased white blood cell count, unspecified: Secondary | ICD-10-CM | POA: Insufficient documentation

## 2020-08-16 DIAGNOSIS — Z8249 Family history of ischemic heart disease and other diseases of the circulatory system: Secondary | ICD-10-CM | POA: Diagnosis not present

## 2020-08-16 DIAGNOSIS — D696 Thrombocytopenia, unspecified: Secondary | ICD-10-CM

## 2020-08-16 DIAGNOSIS — R161 Splenomegaly, not elsewhere classified: Secondary | ICD-10-CM | POA: Insufficient documentation

## 2020-08-16 DIAGNOSIS — Z87891 Personal history of nicotine dependence: Secondary | ICD-10-CM | POA: Insufficient documentation

## 2020-08-16 DIAGNOSIS — K5900 Constipation, unspecified: Secondary | ICD-10-CM | POA: Diagnosis not present

## 2020-08-16 DIAGNOSIS — R110 Nausea: Secondary | ICD-10-CM | POA: Diagnosis not present

## 2020-08-16 DIAGNOSIS — R5383 Other fatigue: Secondary | ICD-10-CM | POA: Diagnosis not present

## 2020-08-16 NOTE — Patient Instructions (Signed)
[place] at [facility] Discharge Instructions  You were seen today by Dr. Delton Coombes. He went over your recent results. You will proceed with your Rituxan treatments in [place]. Dr. Delton Coombes will see you back in 4 months for labs and follow up.   Thank you for choosing [place] at [facility] to provide your oncology and hematology care.  To afford each patient quality time with our provider, please arrive at least 15 minutes before your scheduled appointment time.   If you have a lab appointment with the [facility] please come in thru the Main Entrance and check in at the main information desk  You need to re-schedule your appointment should you arrive 10 or more minutes late.  We strive to give you quality time with our providers, and arriving late affects you and other patients whose appointments are after yours.  Also, if you no show three or more times for appointments you may be dismissed from the clinic at the providers discretion.     Again, thank you for choosing [facility].  Our hope is that these requests will decrease the amount of time that you wait before being seen by our physicians.       _____________________________________________________________  Should you have questions after your visit to [facility], please contact our office at [phone] between the hours of 8:00 a.m. and 4:30 p.m.  Voicemails left after 4:00 p.m. will not be returned until the following business day.  For prescription refill requests, have your pharmacy contact our office and allow 72 hours.    Cancer Center Support Programs:   > Cancer Support Group  2nd Tuesday of the month 1pm-2pm, Journey Room

## 2020-08-16 NOTE — Progress Notes (Signed)
[name], [place] [address]   CLINIC:  Medical Oncology/Hematology  PCP:  [facility], [place] / [place] [address]  [phone]  REASON FOR VISIT:  Follow-up for thrombocytopenia  PRIOR THERAPY: Prednisone 60 mg in 05/2020  CURRENT THERAPY: Observation  INTERVAL HISTORY:  Mr. SKYE RODARTE, a 70 y.o. male, returns for routine follow-up for his thrombocytopenia. Shantanu was last seen on 07/05/2020.  Today he is accompanied by his wife and he reports feeling okay. He was seen by Dr. Caprice Beaver in [facility] and will be starting Rituxan on 04/06. He complains of having muscle spasms in his left lower back and left buttocks radiating down the lateral side of his leg and was started on Flexeril TID which helps ease the cramping. He denies having any F/C or night sweats.  He is scheduled to have an EGD on 03/30 with Dr. Laural Golden.   REVIEW OF SYSTEMS:  Review of Systems  Constitutional: Positive for fatigue (depleted). Negative for appetite change, chills, diaphoresis and fever.  Respiratory: Positive for shortness of breath.   Gastrointestinal: Positive for constipation and nausea.  Musculoskeletal: Positive for back pain (5/10 L lower back pain d/t spasms) and myalgias (spasms in L buttocks).  All other systems reviewed and are negative.   PAST MEDICAL/SURGICAL HISTORY:  Past Medical History:  Diagnosis Date  . Anxiety    patient denies  . Aortic aneurysm (HCC)    ascending  . Arthritis   . Barrett esophagus   . Blood transfusion   . Cancer ([facility]) 2007   melanoma stage 3 stomach bx  . Coronary artery disease    mild by 09/08/13 cath [facility]  . GERD (gastroesophageal reflux disease)    barretts esophagus  . H/O hiatal hernia   . Headache(784.0)   . Hypercholesteremia   . Hypertension   . PONV (postoperative nausea and vomiting)    trouble breathing after last surgery (at [facility])  . Shingles    10  . Sleep apnea    not  tested  . Tumor liver    no problems   Past Surgical History:  Procedure Laterality Date  . ABDOMINAL SURGERY     x3  . back surgeries     x 9  . BACK SURGERY     x8  . BIOPSY N/A 03/28/2013   Procedure: Distal Esophageal Biopsy;  Surgeon: Rogene Houston, MD;  Location: [facility];  Service: Endoscopy;  Laterality: N/A;  . BIOPSY  05/30/2016   Procedure: BIOPSY;  Surgeon: Rogene Houston, MD;  [facility];  Service: Endoscopy;;  . BIOPSY  02/19/2018   Procedure: BIOPSY;  Surgeon: Rogene Houston, MD;  [facility];  Service: Endoscopy;;  Barret's esophagus  . CHEST SURGERY     lung incisional hernia for hernia  . COLONOSCOPY WITH PROPOFOL N/A 02/19/2018   Procedure: COLONOSCOPY WITH PROPOFOL;  Surgeon: Rogene Houston, MD;  [facility];  Service: Endoscopy;  Laterality: N/A;  1:00  . ESOPHAGOGASTRODUODENOSCOPY (EGD) WITH PROPOFOL N/A 03/28/2013   Procedure: ESOPHAGOGASTRODUODENOSCOPY (EGD) WITH PROPOFOL;  Surgeon: Rogene Houston, MD;  Location: [facility];  Service: Endoscopy;  Laterality: N/A;  GE junction @ 38, proximal  margin @37   . ESOPHAGOGASTRODUODENOSCOPY (EGD) WITH PROPOFOL N/A 05/30/2016   Procedure: ESOPHAGOGASTRODUODENOSCOPY (EGD) WITH PROPOFOL;  Surgeon: Rogene Houston, MD;  [facility];  Service: Endoscopy;  Laterality: N/A;  955  . ESOPHAGOGASTRODUODENOSCOPY (  EGD) WITH PROPOFOL N/A 02/19/2018   Procedure: ESOPHAGOGASTRODUODENOSCOPY (EGD) WITH PROPOFOL;  Surgeon: Rogene Houston, MD;  [facility];  Service: Endoscopy;  Laterality: N/A;  . HEMORRHOID SURGERY     x2  . HERNIA REPAIR    . HIATAL HERNIA REPAIR    . JOINT REPLACEMENT     lft knee  . KNEE ARTHROPLASTY     lft  . KNEE ARTHROSCOPY Right   . LEG SURGERY     x4 crushed   . LITHOTRIPSY    . MELANOMA EXCISION     lft abdomen  . NECK SURGERY  2012  . POLYPECTOMY  02/19/2018   Procedure: POLYPECTOMY;  Surgeon: Rogene Houston, MD;  [facility];  Service: Endoscopy;;  proximal transverse colon (CB x1), descending colon (CBx3)  . POSTERIOR CERVICAL FUSION/FORAMINOTOMY  08/18/2011   Procedure: POSTERIOR CERVICAL FUSION/FORAMINOTOMY LEVEL 3;  Surgeon: Ophelia Charter, MD;  Location: [facility] OR[facility];  Service: Neurosurgery;  Laterality: N/A;  Cervical three-four, four-five, five-six posterior cervical  fusion with instrumentation; Right cervical three-four, four-five laminotomy  . REVISION TOTAL HIP ARTHROPLASTY Left   . SHOULDER ARTHROSCOPY     rt bith defect , spur, rotator cuff  . SPINAL CORD STIMULATOR IMPLANT     rt hip  . SPINAL CORD STIMULATOR INSERTION N/A 11/03/2016   Procedure: LUMBAR SPINAL CORD STIMULATOR REVISION;  Surgeon: Newman Pies, MD;  Location: [facility];  Service: Neurosurgery;  Laterality: N/A;  . THYROID LOBECTOMY     rt    SOCIAL HISTORY:  Social History   Socioeconomic History  . Marital status: Married    Spouse name: Not on file  . Number of children: Not on file  . Years of education: Not on file  . Highest education level: Not on file  Occupational History  . Occupation: retired  Tobacco Use  . Smoking status: Former Smoker    Packs/day: 4.00    Years: 10.00    Pack years: 40.00    Types: Cigarettes    Quit date: 08/12/1980    Years since quitting: 40.0  . Smokeless tobacco: Never Used  . Tobacco comment: occ wine  Vaping Use  . Vaping Use: Never used  Substance and Sexual Activity  . Alcohol use: Yes    Alcohol/week: 0.0 standard drinks    Comment: rarely  . Drug use: No  . Sexual activity: Yes    Birth control/protection: None  Other Topics Concern  . Not on file  Social History Narrative  . Not on file   Social Determinants of Health   Financial Resource Strain: Low Risk   . Difficulty of Paying Living Expenses: Not hard at all  Food Insecurity: No Food Insecurity  . Worried About Charity fundraiser in the Last Year: Never true  . Ran Out of Food in the Last Year: Never  true  Transportation Needs: No Transportation Needs  . Lack of Transportation (Medical): No  . Lack of Transportation (Non-Medical): No  Physical Activity: Inactive  . Days of Exercise per Week: 0 days  . Minutes of Exercise per Session: 0 min  Stress: No Stress Concern Present  . Feeling of Stress : Not at all  Social Connections: Moderately Integrated  . Frequency of Communication with Friends and Family: More than three times a week  . Frequency of Social Gatherings with Friends and Family: Once a week  . Attends Religious Services: More than 4 times per year  .  Active Member of Clubs or Organizations: No  . Attends Archivist Meetings: Never  . Marital Status: Married  Human resources officer Violence: Not At Risk  . Fear of Current or Ex-Partner: No  . Emotionally Abused: No  . Physically Abused: No  . Sexually Abused: No    FAMILY HISTORY:  Family History  Problem Relation Age of Onset  . Heart disease Father   . Aneurysm Mother   . Aneurysm Sister   . Heart disease Paternal Grandmother   . Heart disease Paternal Grandfather   . Heart disease Maternal Grandfather   . Heart disease Brother        heart transplant  . Aneurysm Other        pt states several members have aortic aneurysm  . Skin cancer Other        several members  . Lymphoma Brother   . Aneurysm Maternal Grandmother     CURRENT MEDICATIONS:  Current Outpatient Medications  Medication Sig Dispense Refill  . aspirin 81 MG EC tablet Take by mouth.    . Aspirin-Acetaminophen-Caffeine (GOODY HEADACHE PO) Take 1-2 packets by mouth daily as needed (headaches).    Marland Kitchen Bioflavonoid Products (ESTER-C) 500-550 MG TABS Take 1 tablet by mouth daily.    . calcium-vitamin D (OSCAL WITH D) 500-200 MG-UNIT per tablet Take 1 tablet by mouth 2 (two) times daily.     . cyclobenzaprine (FLEXERIL) 10 MG tablet Take 1 tablet (10 mg total) by mouth 3 (three) times daily as needed for muscle spasms. 50 tablet 1  .  dexlansoprazole (DEXILANT) 60 MG capsule Take 60 mg by mouth daily.     . diazepam (VALIUM) 10 MG tablet Take 10 mg by mouth 4 (four) times daily as needed (for back pain).     . fexofenadine (ALLEGRA) 180 MG tablet Take 180 mg by mouth every evening.    . fish oil-omega-3 fatty acids 1000 MG capsule Take 1 g by mouth 2 (two) times daily.    . isosorbide mononitrate (IMDUR) 30 MG 24 hr tablet Take 30 mg by mouth daily.    Marland Kitchen lisinopril (PRINIVIL,ZESTRIL) 10 MG tablet Take 10 mg by mouth daily.    . metoCLOPramide (REGLAN) 10 MG tablet Take 1 tablet by mouth 2 (two) times daily.    . montelukast (SINGULAIR) 10 MG tablet Take 10 mg by mouth daily.     . Multiple Vitamin (MULITIVITAMIN WITH MINERALS) TABS Take 1 tablet by mouth daily.    . ondansetron (ZOFRAN) 8 MG tablet Take 1 tablet (8 mg total) by mouth every 8 (eight) hours as needed for nausea or vomiting. 60 tablet 3  . ondansetron (ZOFRAN-ODT) 4 MG disintegrating tablet Take by mouth.    . oxyCODONE-acetaminophen (PERCOCET) 10-325 MG tablet Take 1 tablet by mouth every 6 (six) hours as needed for pain.    Marland Kitchen Prucalopride Succinate (MOTEGRITY) 2 MG TABS Take 1 tablet (2 mg total) by mouth daily before breakfast. 30 tablet 5  . ranolazine (RANEXA) 500 MG 12 hr tablet Take 500 mg by mouth 2 (two) times daily.    . rosuvastatin (CRESTOR) 10 MG tablet Take 10 mg by mouth every evening.     . sildenafil (VIAGRA) 100 MG tablet TAKE ONE-HALF TABLET BY MOUTH AS INSTRUCTED (TAKE 1 HOUR PRIOR TO SEXUAL ACTIVITY *DO NOT EXCEED 1 DOSE PER 24 HOUR PERIOD*)     No current facility-administered medications for this visit.    ALLERGIES:  Allergies  Allergen Reactions  .  Demerol Other (See Comments)    "Makes me crazy."  . Contrast Media [Iodinated Diagnostic Agents] Nausea Only    PHYSICAL EXAM:  Performance status (ECOG): 1 - Symptomatic but completely ambulatory  Vitals:   08/16/20 1608  BP: 118/77  Pulse: 88  Resp: 17  Temp: 97.9 F (36.6  C)  SpO2: 96%   Wt Readings from Last 3 Encounters:  08/16/20 244 lb (110.7 kg)  08/14/20 237 lb (107.5 kg)  07/05/20 240 lb 7 oz (109.1 kg)   Physical Exam Vitals reviewed.  Constitutional:      Appearance: Normal appearance. He is obese.  Cardiovascular:     Rate and Rhythm: Normal rate and regular rhythm.     Pulses: Normal pulses.     Heart sounds: Normal heart sounds.  Pulmonary:     Effort: Pulmonary effort is normal.     Breath sounds: Normal breath sounds.  Chest:  Breasts:     Right: No supraclavicular adenopathy.     Left: No supraclavicular adenopathy.    Abdominal:     Palpations: Abdomen is soft. There is splenomegaly. There is no hepatomegaly or mass.     Tenderness: There is no abdominal tenderness.     Hernia: No hernia is present.  Musculoskeletal:     Thoracic back: No tenderness or bony tenderness.     Lumbar back: No tenderness or bony tenderness.     Right lower leg: No edema.     Left lower leg: No edema.  Lymphadenopathy:     Cervical: No cervical adenopathy.     Upper Body:     Right upper body: No supraclavicular adenopathy.     Left upper body: No supraclavicular adenopathy.  Neurological:     General: No focal deficit present.     Mental Status: He is alert and oriented to person, place, and time.  Psychiatric:        Mood and Affect: Mood normal.        Behavior: Behavior normal.     LABORATORY DATA:  I have reviewed the labs as listed.  CBC Latest Ref Rng & Units 07/05/2020 06/26/2020 06/07/2020  WBC 4.0 - 10.5 K/uL 2.1(L) 1.9(L) 1.6(L)  Hemoglobin 13.0 - 17.0 g/dL 13.3 13.2 13.3  Hematocrit 39.0 - 52.0 % 41.1 38.9(L) 40.6  Platelets 150 - 400 K/uL 66(L) 67(L) 61(L)   CMP Latest Ref Rng & Units 06/07/2020 05/29/2020 05/22/2020  Glucose 70 - 99 mg/dL 99 95 123(H)  BUN 8 - 23 mg/dL 23 25(H) 22  Creatinine 0.61 - 1.24 mg/dL 1.42(H) 1.35(H) 1.13  Sodium 135 - 145 mmol/L 136 137 135  Potassium 3.5 - 5.1 mmol/L 4.4 4.1 3.5  Chloride 98 -  111 mmol/L 100 102 105  CO2 22 - 32 mmol/L 25 25 21(L)  Calcium 8.9 - 10.3 mg/dL 9.1 9.2 8.9  Total Protein 6.5 - 8.1 g/dL 8.2(H) - 8.0  Total Bilirubin 0.3 - 1.2 mg/dL 0.7 - 1.0  Alkaline Phos 38 - 126 U/L 60 - 58  AST 15 - 41 U/L 40 - 61(H)  ALT 0 - 44 U/L 29 - 42      Component Value Date/Time   RBC 4.47 07/05/2020 1252   MCV 91.9 07/05/2020 1252   MCH 29.8 07/05/2020 1252   MCHC 32.4 07/05/2020 1252   RDW 14.1 07/05/2020 1252   LYMPHSABS 0.8 07/05/2020 1252   MONOABS 0.2 07/05/2020 1252   EOSABS 0.0 07/05/2020 1252   BASOSABS 0.0 07/05/2020 1252  DIAGNOSTIC IMAGING:  I have independently reviewed the scans and discussed with the patient. No results found.   ASSESSMENT:  1. Moderate thrombocytopenia: -Seen as referral from Dr. Nolon Rod office for decreased platelet count of 80 on CBC on 10/21/2018. -Ex-smoker, quit in 1982, smoked 1 to 2 packs/day for 10 years. He also spent 2 years at [facility]. -CT abdomen on 11/08/2018 showed normal size spleen with no abdominal or pelvic pathology. -BMBX on 03/01/2019 showed slightly hypercellular marrow for age with trilineage hematopoiesis including abundant megakaryocytes and predominantly normal morphology. Overall myeloid changes are nonspecific and not diagnostic for MDS. Chromosome analysis was normal. MDS FISH panel was normal. Core biopsy showed small lymphoid aggregates, features are not considered specific or diagnostic of lymphoproliferative disorder. -He was treated with pulsed dexamethasone around the 01/27/2020 with improvement of platelet count to 70 K. He could not tolerate dexamethasone due to sleeplessness. -He was also treated with prednisone 60 mg daily on 05/22/2020. He took it only for 2 to 3 days and stopped it due to inability to sleep. -Treated with immunoglobulin from 05/14/2020 through 05/18/2020. -PET scan on 06/05/2020 view shows no hypermetabolic lymph nodes in the chest, abdomen or pelvis. Splenomegaly  measuring 18 cm with normal metabolic activity. -Bone marrow biopsy on 06/26/2020 with mildly hypercellular marrow, no significant dysplasia.  Scattered large lymphoid aggregates with a predominance of B cells.  Flow cytometry was nondiagnostic.  There is no expression of CD5 or CD10 on the B cells. 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  However no clonality is demonstrated in the kappa light chain gene (IGK) -Myeloma FISH panel was negative.  Cytogenetics was 52, XY. -He was evaluated as second opinion by Dr. 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  2. Malignant melanoma: -Melanoma resected on the left side of the abdomen with left axillary sentinel lymph node biopsy at [facility] 14 years ago. Reportedly stage III.   PLAN:  1. Moderate thrombocytopenia: -He was evaluated by Dr. Caprice Beaver at [facility] on 08/09/2020. 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  Rituximab treatment was recommended. -He will have 4 weekly doses of rituximab therapy starting on 09/05/2020 at [facility] to see him back in 4 months with repeat labs.  2. Mild leukopenia: -Reviewed CBC from [facility] on 08/08/2020 with white count 2.7.  LDH 121.  3.  Massive splenomegaly: -Spleen measures 18 cm with no increased activity on PET scan.  Spleen not palpable by physical exam. -Ultrasound of the liver shows fatty liver.  Hepatitis B and C serology was negative. -If there is no improvement in spleen size after rituximab, liver biopsy can be considered.  Dr. Laural Golden recommended liver biopsy by transjugular approach.  Orders placed this encounter:  Orders Placed This Encounter  Procedures  . CBC with Differential/Platelet  . Lactate dehydrogenase   Total time spent is 30 minutes with more than 50% of the time spent  face-to-face discussing plan, counseling and coordination of care.  Derek Jack, MD [place] [phone]   I, Milinda Antis, am acting as a scribe for Dr. Sanda Linger.  I, Derek Jack MD, have reviewed the above documentation for accuracy and completeness, and I agree with the above.

## 2020-08-24 ENCOUNTER — Other Ambulatory Visit (INDEPENDENT_AMBULATORY_CARE_PROVIDER_SITE_OTHER): Payer: Self-pay

## 2020-08-24 DIAGNOSIS — K760 Fatty (change of) liver, not elsewhere classified: Secondary | ICD-10-CM

## 2020-08-24 DIAGNOSIS — R112 Nausea with vomiting, unspecified: Secondary | ICD-10-CM

## 2020-08-24 DIAGNOSIS — K3184 Gastroparesis: Secondary | ICD-10-CM

## 2020-08-24 NOTE — Patient Instructions (Signed)
[facility]  08/24/2020     @PREFPERIOPPHARMACY @   Your procedure is scheduled on 08/29/2020.    Report to [facility] at  0800  A.M.   Call this number if you have problems the morning of surgery:  [phone]   Remember:  Follow the diet instructions given to you by the office.                     Take these medicines the morning of surgery with A SIP OF WATER  Flexeril (if needed), dexilant, valium (if needed), imdur, reglan, singulair, zofran (if needed), oxycodone (if needed), ranexa,    Please brush your teeth.  Do not wear jewelry, make-up or nail polish.  Do not wear lotions, powders, or perfumes, or deodorant.  Do not shave 48 hours prior to surgery.  Men may shave face and neck.  Do not bring valuables to the hospital.  [facility] is not responsible for any belongings or valuables.  Contacts, dentures or bridgework may not be worn into surgery.  Leave your suitcase in the car.  After surgery it may be brought to your room.  For patients admitted to the hospital, discharge time will be determined by your treatment team.  Patients discharged the day of surgery will not be allowed to drive home and must have someone with them for 24 hours.   Special instructions:   DO NOT smoke tobacco or vape the morning of your procedure.   Please read over the following fact sheets that you were given. Anesthesia Post-op Instructions and Care and Recovery After Surgery       Upper Endoscopy, Adult, Care After This sheet gives you information about how to care for yourself after your procedure. Your health care provider may also give you more specific instructions. If you have problems or questions, contact your health care provider. What can I expect after the procedure? After the procedure, it is common to have:  A sore throat.  Mild stomach pain or discomfort.  Bloating.  Nausea. Follow these instructions at home:  Follow instructions from your health  care provider about what to eat or drink after your procedure.  Return to your normal activities as told by your health care provider. Ask your health care provider what activities are safe for you.  Take over-the-counter and prescription medicines only as told by your health care provider.  If you were given a sedative during the procedure, it can affect you for several hours. Do not drive or operate machinery until your health care provider says that it is safe.  Keep all follow-up visits as told by your health care provider. This is important.   Contact a health care provider if you have:  A sore throat that lasts longer than one day.  Trouble swallowing. Get help right away if:  You vomit blood or your vomit looks like coffee grounds.  You have: ? A fever. ? Bloody, black, or tarry stools. ? A severe sore throat or you cannot swallow. ? Difficulty breathing. ? Severe pain in your chest or abdomen. Summary  After the procedure, it is common to have a sore throat, mild stomach discomfort, bloating, and nausea.  If you were given a sedative during the procedure, it can affect you for several hours. Do not drive or operate machinery until your health care provider says that it is safe.  Follow instructions from your health care provider about what  to eat or drink after your procedure.  Return to your normal activities as told by your health care provider. This information is not intended to replace advice given to you by your health care provider. Make sure you discuss any questions you have with your health care provider. Document Revised: 05/17/2019 Document Reviewed: 10/19/2017 Elsevier Patient Education  2021 [place] After This sheet gives you information about how to care for yourself after your procedure. Your health care provider may also give you more specific instructions. If you have problems or questions, contact your health care  provider. What can I expect after the procedure? After the procedure, it is common to have:  Tiredness.  Forgetfulness about what happened after the procedure.  Impaired judgment for important decisions.  Nausea or vomiting.  Some difficulty with balance. Follow these instructions at home: For the time period you were told by your health care provider:  Rest as needed.  Do not participate in activities where you could fall or become injured.  Do not drive or use machinery.  Do not drink alcohol.  Do not take sleeping pills or medicines that cause drowsiness.  Do not make important decisions or sign legal documents.  Do not take care of children on your own.      Eating and drinking  Follow the diet that is recommended by your health care provider.  Drink enough fluid to keep your urine pale yellow.  If you vomit: ? Drink water, juice, or soup when you can drink without vomiting. ? Make sure you have little or no nausea before eating solid foods. General instructions  Have a responsible adult stay with you for the time you are told. It is important to have someone help care for you until you are awake and alert.  Take over-the-counter and prescription medicines only as told by your health care provider.  If you have sleep apnea, surgery and certain medicines can increase your risk for breathing problems. Follow instructions from your health care provider about wearing your sleep device: ? Anytime you are sleeping, including during daytime naps. ? While taking prescription pain medicines, sleeping medicines, or medicines that make you drowsy.  Avoid smoking.  Keep all follow-up visits as told by your health care provider. This is important. Contact a health care provider if:  You keep feeling nauseous or you keep vomiting.  You feel light-headed.  You are still sleepy or having trouble with balance after 24 hours.  You develop a rash.  You have a  fever.  You have redness or swelling around the IV site. Get help right away if:  You have trouble breathing.  You have new-onset confusion at home. Summary  For several hours after your procedure, you may feel tired. You may also be forgetful and have poor judgment.  Have a responsible adult stay with you for the time you are told. It is important to have someone help care for you until you are awake and alert.  Rest as told. Do not drive or operate machinery. Do not drink alcohol or take sleeping pills.  Get help right away if you have trouble breathing, or if you suddenly become confused. This information is not intended to replace advice given to you by your health care provider. Make sure you discuss any questions you have with your health care provider. Document Revised: 02/02/2020 Document Reviewed: 04/21/2019 Elsevier Patient Education  2021 [employer].

## 2020-08-27 ENCOUNTER — Encounter (HOSPITAL_COMMUNITY)
Admission: RE | Admit: 2020-08-27 | Discharge: 2020-08-27 | Disposition: A | Discharge status: Home / Self Care | Payer: Medicare Other | Source: Ambulatory Visit | Attending: Internal Medicine | Admitting: Internal Medicine

## 2020-08-27 ENCOUNTER — Encounter (HOSPITAL_COMMUNITY): Payer: Self-pay

## 2020-08-27 ENCOUNTER — Other Ambulatory Visit (HOSPITAL_COMMUNITY)
Admission: RE | Admit: 2020-08-27 | Discharge: 2020-08-27 | Disposition: A | Discharge status: Home / Self Care | Payer: Medicare Other | Source: Ambulatory Visit | Attending: Internal Medicine | Admitting: Internal Medicine

## 2020-08-27 ENCOUNTER — Other Ambulatory Visit: Payer: Self-pay

## 2020-08-27 DIAGNOSIS — Z20822 Contact with and (suspected) exposure to covid-19: Secondary | ICD-10-CM | POA: Diagnosis not present

## 2020-08-27 DIAGNOSIS — Z01818 Encounter for other preprocedural examination: Secondary | ICD-10-CM | POA: Insufficient documentation

## 2020-08-27 LAB — BASIC METABOLIC PANEL
Anion gap: 12 (ref 5–15)
BUN: 18 mg/dL (ref 8–23)
CO2: 18 mmol/L — ABNORMAL LOW (ref 22–32)
Calcium: 9.4 mg/dL (ref 8.9–10.3)
Chloride: 104 mmol/L (ref 98–111)
Creatinine, Ser: 1.32 mg/dL — ABNORMAL HIGH (ref 0.61–1.24)
GFR, Estimated: 58 mL/min — ABNORMAL LOW (ref >60)
Glucose, Bld: 92 mg/dL (ref 70–99)
Potassium: 4.2 mmol/L (ref 3.5–5.1)
Sodium: 134 mmol/L — ABNORMAL LOW (ref 135–145)

## 2020-08-27 LAB — CBC WITH DIFFERENTIAL/PLATELET
Abs Immature Granulocytes: 0.01 10*3/uL (ref 0.00–0.07)
Basophils Absolute: 0 10*3/uL (ref 0.0–0.1)
Basophils Relative: 0 %
Eosinophils Absolute: 0 10*3/uL (ref 0.0–0.5)
Eosinophils Relative: 1 %
HCT: 41.5 % (ref 39.0–52.0)
Hemoglobin: 14 g/dL (ref 13.0–17.0)
Immature Granulocytes: 0 %
Lymphocytes Relative: 35 %
Lymphs Abs: 0.9 10*3/uL (ref 0.7–4.0)
MCH: 31 pg (ref 26.0–34.0)
MCHC: 33.7 g/dL (ref 30.0–36.0)
MCV: 92 fL (ref 80.0–100.0)
Monocytes Absolute: 0.3 10*3/uL (ref 0.1–1.0)
Monocytes Relative: 12 %
Neutro Abs: 1.2 10*3/uL — ABNORMAL LOW (ref 1.7–7.7)
Neutrophils Relative %: 52 %
Platelets: 68 10*3/uL — ABNORMAL LOW (ref 150–400)
RBC: 4.51 MIL/uL (ref 4.22–5.81)
RDW: 13.5 % (ref 11.5–15.5)
WBC: 2.4 10*3/uL — ABNORMAL LOW (ref 4.0–10.5)
nRBC: 0 % (ref 0.0–0.2)

## 2020-08-27 LAB — SARS CORONAVIRUS 2 (TAT 6-24 HRS): SARS Coronavirus 2: NEGATIVE

## 2020-08-29 ENCOUNTER — Encounter (HOSPITAL_COMMUNITY): Payer: Self-pay | Admitting: Internal Medicine

## 2020-08-29 ENCOUNTER — Ambulatory Visit (HOSPITAL_COMMUNITY): Payer: Medicare Other | Admitting: Anesthesiology

## 2020-08-29 ENCOUNTER — Ambulatory Visit (HOSPITAL_COMMUNITY)
Admission: RE | Admit: 2020-08-29 | Discharge: 2020-08-29 | Disposition: A | Discharge status: Home / Self Care | Payer: Medicare Other | Attending: Internal Medicine | Admitting: Internal Medicine

## 2020-08-29 ENCOUNTER — Other Ambulatory Visit: Payer: Self-pay

## 2020-08-29 ENCOUNTER — Encounter (HOSPITAL_COMMUNITY)
Admission: RE | Disposition: A | Discharge status: Home / Self Care | Payer: Self-pay | Source: Home / Self Care | Attending: Internal Medicine

## 2020-08-29 DIAGNOSIS — Z87891 Personal history of nicotine dependence: Secondary | ICD-10-CM | POA: Diagnosis not present

## 2020-08-29 DIAGNOSIS — Z79899 Other long term (current) drug therapy: Secondary | ICD-10-CM | POA: Insufficient documentation

## 2020-08-29 DIAGNOSIS — K449 Diaphragmatic hernia without obstruction or gangrene: Secondary | ICD-10-CM | POA: Diagnosis not present

## 2020-08-29 DIAGNOSIS — Z85820 Personal history of malignant melanoma of skin: Secondary | ICD-10-CM | POA: Diagnosis not present

## 2020-08-29 DIAGNOSIS — K2270 Barrett's esophagus without dysplasia: Secondary | ICD-10-CM | POA: Insufficient documentation

## 2020-08-29 DIAGNOSIS — I2510 Atherosclerotic heart disease of native coronary artery without angina pectoris: Secondary | ICD-10-CM | POA: Diagnosis not present

## 2020-08-29 DIAGNOSIS — K3184 Gastroparesis: Secondary | ICD-10-CM

## 2020-08-29 DIAGNOSIS — K760 Fatty (change of) liver, not elsewhere classified: Secondary | ICD-10-CM | POA: Diagnosis not present

## 2020-08-29 DIAGNOSIS — I10 Essential (primary) hypertension: Secondary | ICD-10-CM | POA: Diagnosis not present

## 2020-08-29 DIAGNOSIS — Z7982 Long term (current) use of aspirin: Secondary | ICD-10-CM | POA: Insufficient documentation

## 2020-08-29 DIAGNOSIS — E7849 Other hyperlipidemia: Secondary | ICD-10-CM | POA: Diagnosis not present

## 2020-08-29 DIAGNOSIS — K219 Gastro-esophageal reflux disease without esophagitis: Secondary | ICD-10-CM | POA: Insufficient documentation

## 2020-08-29 DIAGNOSIS — Z885 Allergy status to narcotic agent status: Secondary | ICD-10-CM | POA: Diagnosis not present

## 2020-08-29 DIAGNOSIS — K2289 Other specified disease of esophagus: Secondary | ICD-10-CM | POA: Insufficient documentation

## 2020-08-29 DIAGNOSIS — R112 Nausea with vomiting, unspecified: Secondary | ICD-10-CM

## 2020-08-29 HISTORY — PX: ESOPHAGOGASTRODUODENOSCOPY (EGD) WITH PROPOFOL: SHX5813

## 2020-08-29 HISTORY — PX: BIOPSY: SHX5522

## 2020-08-29 SURGERY — ESOPHAGOGASTRODUODENOSCOPY (EGD) WITH PROPOFOL
Anesthesia: General

## 2020-08-29 MED ORDER — EPHEDRINE SULFATE-NACL 50-0.9 MG/10ML-% IV SOSY
PREFILLED_SYRINGE | INTRAVENOUS | Status: DC | PRN
  Administered 2020-08-29: 5 mg via INTRAVENOUS

## 2020-08-29 MED ORDER — CHLORHEXIDINE GLUCONATE CLOTH 2 % EX PADS: 6.0000 | MEDICATED_PAD | Freq: Once | CUTANEOUS | Status: DC

## 2020-08-29 MED ORDER — PROPOFOL 10 MG/ML IV BOLUS
INTRAVENOUS | Status: DC | PRN
Start: 2020-08-29 — End: 2020-08-29
  Administered 2020-08-29: 100 mg via INTRAVENOUS

## 2020-08-29 MED ORDER — LACTATED RINGERS IV SOLN: INTRAVENOUS | Status: DC

## 2020-08-29 MED ORDER — LIDOCAINE VISCOUS HCL 2 % MT SOLN
OROMUCOSAL | Status: AC
  Filled 2020-08-29: qty 15

## 2020-08-29 MED ORDER — LIDOCAINE HCL (CARDIAC) PF 100 MG/5ML IV SOSY
PREFILLED_SYRINGE | INTRAVENOUS | Status: DC | PRN
  Administered 2020-08-29: 50 mg via INTRAVENOUS

## 2020-08-29 MED ORDER — LIDOCAINE VISCOUS HCL 2 % MT SOLN: 15.0000 mL | Freq: Once | OROMUCOSAL | Status: DC

## 2020-08-29 MED ORDER — PROPOFOL 500 MG/50ML IV EMUL
INTRAVENOUS | Status: DC | PRN
  Administered 2020-08-29: 150 ug/kg/min via INTRAVENOUS

## 2020-08-29 MED ORDER — EPHEDRINE 5 MG/ML INJ
INTRAVENOUS | Status: AC
  Filled 2020-08-29: qty 10

## 2020-08-29 NOTE — Discharge Instructions (Signed)
No aspirin or NSAIDs for 24 hours. Resume other medications and diet as before. No driving for 24 hours. Physician will call with biopsy results.      Upper Endoscopy, Adult, Care After This sheet gives you information about how to care for yourself after your procedure. Your health care provider may also give you more specific instructions. If you have problems or questions, contact your health care provider. What can I expect after the procedure? After the procedure, it is common to have:  A sore throat.  Mild stomach pain or discomfort.  Bloating.  Nausea. Follow these instructions at home:  Follow instructions from your health care provider about what to eat or drink after your procedure.  Return to your normal activities as told by your health care provider. Ask your health care provider what activities are safe for you.  Take over-the-counter and prescription medicines only as told by your health care provider.  If you were given a sedative during the procedure, it can affect you for several hours. Do not drive or operate machinery until your health care provider says that it is safe.  Keep all follow-up visits as told by your health care provider. This is important.   Contact a health care provider if you have:  A sore throat that lasts longer than one day.  Trouble swallowing. Get help right away if:  You vomit blood or your vomit looks like coffee grounds.  You have: ? A fever. ? Bloody, black, or tarry stools. ? A severe sore throat or you cannot swallow. ? Difficulty breathing. ? Severe pain in your chest or abdomen. Summary  After the procedure, it is common to have a sore throat, mild stomach discomfort, bloating, and nausea.  If you were given a sedative during the procedure, it can affect you for several hours. Do not drive or operate machinery until your health care provider says that it is safe.  Follow instructions from your health care provider  about what to eat or drink after your procedure.  Return to your normal activities as told by your health care provider. This information is not intended to replace advice given to you by your health care provider. Make sure you discuss any questions you have with your health care provider. Document Revised: 05/17/2019 Document Reviewed: 10/19/2017 Elsevier Patient Education  2021 [place] After This sheet gives you information about how to care for yourself after your procedure. Your health care provider may also give you more specific instructions. If you have problems or questions, contact your health care provider. What can I expect after the procedure? After the procedure, it is common to have:  Tiredness.  Forgetfulness about what happened after the procedure.  Impaired judgment for important decisions.  Nausea or vomiting.  Some difficulty with balance. Follow these instructions at home: For the time period you were told by your health care provider:  Rest as needed.  Do not participate in activities where you could fall or become injured.  Do not drive or use machinery.  Do not drink alcohol.  Do not take sleeping pills or medicines that cause drowsiness.  Do not make important decisions or sign legal documents.  Do not take care of children on your own.      Eating and drinking  Follow the diet that is recommended by your health care provider.  Drink enough fluid to keep your urine pale yellow.  If you vomit: ?  Drink water, juice, or soup when you can drink without vomiting. ? Make sure you have little or no nausea before eating solid foods. General instructions  Have a responsible adult stay with you for the time you are told. It is important to have someone help care for you until you are awake and alert.  Take over-the-counter and prescription medicines only as told by your health care provider.  If you  have sleep apnea, surgery and certain medicines can increase your risk for breathing problems. Follow instructions from your health care provider about wearing your sleep device: ? Anytime you are sleeping, including during daytime naps. ? While taking prescription pain medicines, sleeping medicines, or medicines that make you drowsy.  Avoid smoking.  Keep all follow-up visits as told by your health care provider. This is important. Contact a health care provider if:  You keep feeling nauseous or you keep vomiting.  You feel light-headed.  You are still sleepy or having trouble with balance after 24 hours.  You develop a rash.  You have a fever.  You have redness or swelling around the IV site. Get help right away if:  You have trouble breathing.  You have new-onset confusion at home. Summary  For several hours after your procedure, you may feel tired. You may also be forgetful and have poor judgment.  Have a responsible adult stay with you for the time you are told. It is important to have someone help care for you until you are awake and alert.  Rest as told. Do not drive or operate machinery. Do not drink alcohol or take sleeping pills.  Get help right away if you have trouble breathing, or if you suddenly become confused. This information is not intended to replace advice given to you by your health care provider. Make sure you discuss any questions you have with your health care provider. Document Revised: 02/02/2020 Document Reviewed: 04/21/2019 Elsevier Patient Education  2021 [employer].

## 2020-08-29 NOTE — Op Note (Signed)
[facility] Patient Name: [name] Procedure Date: 08/29/2020 8:54 AM MRN: [id number] Date of Birth: 04-01-1951 Attending MD: Hildred Laser , MD CSN: [id number] Age: 70 Admit Type: Outpatient Procedure:                Upper GI endoscopy Indications:              Follow-up of gastro-esophageal reflux disease,                            Barrett's esophagus, Follow-up of Barrett's                            esophagus Providers:                Hildred Laser, MD, Lurline Del, RN, Casimer Bilis, Technician Referring MD:             Redmond [employer], MD and Derek Jack, MD Medicines:                Propofol per Anesthesia Complications:            No immediate complications. Estimated Blood Loss:     Estimated blood loss was minimal. Procedure:                Pre-Anesthesia Assessment:                           - Prior to the procedure, a History and Physical                            was performed, and patient medications and                            allergies were reviewed. The patient's tolerance of                            previous anesthesia was also reviewed. The risks                            and benefits of the procedure and the sedation                            options and risks were discussed with the patient.                            All questions were answered, and informed consent                            was obtained. Prior Anticoagulants: The patient has                            taken no previous anticoagulant or antiplatelet  agents except for aspirin. ASA Grade Assessment:                            III - A patient with severe systemic disease. After                            reviewing the risks and benefits, the patient was                            deemed in satisfactory condition to undergo the                            procedure.                           After obtaining informed  consent, the endoscope was                            passed under direct vision. Throughout the                            procedure, the patient's blood pressure, pulse, and                            oxygen saturations were monitored continuously. The                            [id number]-H190 ([id number]) scope was introduced through the                            mouth, and advanced to the second part of duodenum.                            The upper GI endoscopy was accomplished without                            difficulty. The patient tolerated the procedure                            well. Scope In: 9:34:23 AM Scope Out: 9:44:30 AM Total Procedure Duration: 0 hours 10 minutes 7 seconds  Findings:      The hypopharynx was normal.      The proximal esophagus, mid esophagus and distal esophagus were normal.      There were esophageal mucosal changes suspicious for short-segment       Barrett's esophagus present in the distal esophagus. The maximum       longitudinal extent of these mucosal changes was 1 cm in length. Mucosa       was biopsied with a cold forceps for histology randomly 0.5 cm proximal       to the GE junction.      The Z-line was irregular and was found 36 cm from the incisors.      A 2 cm hiatal hernia was present.      Loose fundal wrap otherwise stomach was normal. Single pill noted  in       proximal stomach.      The duodenal bulb and second portion of the duodenum were normal. Impression:               - Normal hypopharynx.                           - Normal proximal esophagus, mid esophagus and                            distal esophagus.                           - Esophageal mucosal changes suspicious for                            short-segment Barrett's esophagus. Biopsied.                           - Z-line irregular, 36 cm from the incisors.                           - 2 cm hiatal hernia.                           - Loose fundal wrap otherwise normal stomach.                             Single pill noted in proximal stomach.                           - Normal duodenal bulb and second portion of the                            duodenum. Moderate Sedation:      Per Anesthesia Care Recommendation:           - Patient has a contact number available for                            emergencies. The signs and symptoms of potential                            delayed complications were discussed with the                            patient. Return to normal activities tomorrow.                            Written discharge instructions were provided to the                            patient.                           - Resume previous diet today.                           -  Continue present medications.                           - No aspirin, ibuprofen, naproxen, or other                            non-steroidal anti-inflammatory drugs for 1 day.                           - Await pathology results. Procedure Code(s):        --- Professional ---                           [phone], Esophagogastroduodenoscopy, flexible,                            transoral; with biopsy, single or multiple Diagnosis Code(s):        --- Professional ---                           K22.70, Barrett's esophagus without dysplasia                           K22.8, Other specified diseases of esophagus                           K44.9, Diaphragmatic hernia without obstruction or                            gangrene                           K21.9, Gastro-esophageal reflux disease without                            esophagitis CPT copyright 2019 [employer]. All rights reserved. The codes documented in this report are preliminary and upon coder review may  be revised to meet current compliance requirements. Hildred Laser, MD Hildred Laser, MD 08/29/2020 9:55:16 AM This report has been signed electronically. Number of Addenda: 0

## 2020-08-29 NOTE — Transfer of Care (Signed)
Immediate Anesthesia Transfer of Care Note  Patient: [name]  Procedure(s) Performed: ESOPHAGOGASTRODUODENOSCOPY (EGD) WITH PROPOFOL (N/A ) BIOPSY  Patient Location: Short Stay  Anesthesia Type:General  Level of Consciousness: awake and oriented  Airway & Oxygen Therapy: Patient Spontanous Breathing  Post-op Assessment: Report given to RN, Post -op Vital signs reviewed and stable and Patient moving all extremities X 4  Post vital signs: Reviewed and stable  Last Vitals:  Vitals Value Taken Time  BP    Temp    Pulse    Resp    SpO2      Last Pain:  Vitals:   08/29/20 0936  TempSrc:   PainSc: 0-No pain         Complications: No complications documented.

## 2020-08-29 NOTE — Anesthesia Postprocedure Evaluation (Signed)
Anesthesia Post Note  Patient: [name]  Procedure(s) Performed: ESOPHAGOGASTRODUODENOSCOPY (EGD) WITH PROPOFOL (N/A ) BIOPSY  Patient location during evaluation: Phase II Anesthesia Type: General Level of consciousness: awake and alert and oriented Pain management: pain level controlled Vital Signs Assessment: post-procedure vital signs reviewed and stable Respiratory status: spontaneous breathing, nonlabored ventilation and respiratory function stable Cardiovascular status: blood pressure returned to baseline and stable Postop Assessment: no apparent nausea or vomiting Anesthetic complications: no   No complications documented.   Last Vitals:  Vitals:   08/29/20 0951 08/29/20 0956  BP: (!) 95/48 116/77  Pulse: 73 79  Resp: 16 18  Temp: 36.6 C   SpO2: 94%     Last Pain:  Vitals:   08/29/20 0956  TempSrc:   PainSc: 0-No pain                 Orlie Dakin

## 2020-08-29 NOTE — Anesthesia Preprocedure Evaluation (Signed)
Anesthesia Evaluation  Patient identified by MRN, date of birth, ID band Patient awake    Reviewed: Allergy & Precautions, NPO status , Patient's Chart, lab work & pertinent test results  History of Anesthesia Complications (+) PONV and history of anesthetic complications  Airway Mallampati: II  TM Distance: >3 FB Neck ROM: Full   Comment: Neck sx Dental  (+) Dental Advisory Given, Missing   Pulmonary sleep apnea and Continuous Positive Airway Pressure Ventilation , former smoker,    Pulmonary exam normal breath sounds clear to auscultation       Cardiovascular hypertension, Pt. on medications + CAD and + Peripheral Vascular Disease  Normal cardiovascular exam Rhythm:Regular Rate:Normal     Neuro/Psych  Headaches, Anxiety    GI/Hepatic Neg liver ROS, hiatal hernia, GERD  Medicated,  Endo/Other  negative endocrine ROS  Renal/GU negative Renal ROS     Musculoskeletal  (+) Arthritis  (postrior cervical fusion),   Abdominal   Peds  Hematology  (+) Blood dyscrasia (thrombocytopenia ), anemia ,   Anesthesia Other Findings   Reproductive/Obstetrics negative OB ROS                             Anesthesia Physical Anesthesia Plan  ASA: III  Anesthesia Plan: General   Post-op Pain Management:    Induction: Intravenous  PONV Risk Score and Plan: Propofol infusion  Airway Management Planned: Nasal Cannula and Natural Airway  Additional Equipment:   Intra-op Plan:   Post-operative Plan:   Informed Consent: I have reviewed the patients History and Physical, chart, labs and discussed the procedure including the risks, benefits and alternatives for the proposed anesthesia with the patient or authorized representative who has indicated his/her understanding and acceptance.     Dental advisory given  Plan Discussed with: CRNA and Surgeon  Anesthesia Plan Comments:          Anesthesia Quick Evaluation

## 2020-08-29 NOTE — H&P (Signed)
[name] is an 70 y.o. male.   Chief Complaint: Patient is here for esophagogastroduodenoscopy. HPI: Patient is 70 year old Caucasian male who has chronic GERD complicated by short segment Barrett's esophagus multiple failed antireflux surgeries who also has gastroparesis and remains with frequent heartburn nausea gagging and vomiting.  He is on low-dose metoclopramide.  He also has fatty liver.  Patient was evaluated for thrombocytopenia and diagnosed with lymphoma.  He is to start chemotherapy next month.  He denies hematemesis melena or abdominal pain.  He is undergoing diagnostic EGD. Last EGD was in September 2019.  Past Medical History:  Diagnosis Date  . Anxiety    patient denies  . Aortic aneurysm (HCC)    ascending  . Arthritis   . Barrett esophagus   . Blood transfusion   . Cancer ([place]) 2007   melanoma stage 3 stomach bx  . 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 Coronary artery disease    mild by 09/08/13 cath HPR  . GERD (gastroesophageal reflux disease)    barretts esophagus  . H/O hiatal hernia   . Headache(784.0)   . Hypercholesteremia   . Hypertension   . PONV (postoperative nausea and vomiting)    trouble breathing after last surgery (at [facility])  . Shingles    10  . Sleep apnea    not tested  . 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 ABDOMINAL SURGERY     x3  . back surgeries     x 9  . BACK SURGERY     x8  . BIOPSY N/A 03/28/2013   Procedure: Distal Esophageal Biopsy;  Surgeon: Rogene Houston, MD;  Location: [facility];  Service: Endoscopy;  Laterality: N/A;  . BIOPSY  05/30/2016   Procedure: BIOPSY;  Surgeon: Rogene Houston, MD;  [facility];  Service: Endoscopy;;  . BIOPSY  02/19/2018   Procedure: BIOPSY;  Surgeon: Rogene Houston, MD;  [facility];  Service: Endoscopy;;  Barret's esophagus  . CARDIAC CATHETERIZATION  2020  . CHEST SURGERY     lung incisional  hernia for hernia  . COLONOSCOPY WITH PROPOFOL N/A 02/19/2018   Procedure: COLONOSCOPY WITH PROPOFOL;  Surgeon: Rogene Houston, MD;  [facility];  Service: Endoscopy;  Laterality: N/A;  1:00  . CORONARY ANGIOPLASTY WITH STENT PLACEMENT  2020   2 blockages: 1 stent  . ESOPHAGOGASTRODUODENOSCOPY (EGD) WITH PROPOFOL N/A 03/28/2013   Procedure: ESOPHAGOGASTRODUODENOSCOPY (EGD) WITH PROPOFOL;  Surgeon: Rogene Houston, MD;  Location: [facility];  Service: Endoscopy;  Laterality: N/A;  GE junction @ 38, proximal  margin @37   . ESOPHAGOGASTRODUODENOSCOPY (EGD) WITH PROPOFOL N/A 05/30/2016   Procedure: ESOPHAGOGASTRODUODENOSCOPY (EGD) WITH PROPOFOL;  Surgeon: Rogene Houston, MD;  [facility];  Service: Endoscopy;  Laterality: N/A;  955  . ESOPHAGOGASTRODUODENOSCOPY (EGD) WITH PROPOFOL N/A 02/19/2018   Procedure: ESOPHAGOGASTRODUODENOSCOPY (EGD) WITH PROPOFOL;  Surgeon: Rogene Houston, MD;  [facility];  Service: Endoscopy;  Laterality: N/A;  . HEMORRHOID SURGERY     x2  . HERNIA REPAIR    . HIATAL HERNIA REPAIR    . JOINT REPLACEMENT     lft knee  . KNEE ARTHROPLASTY     lft  . KNEE ARTHROSCOPY Right   . LEG SURGERY     x4 crushed   . LITHOTRIPSY    .  MELANOMA EXCISION     lft abdomen  . NECK SURGERY  2012  . POLYPECTOMY  02/19/2018   Procedure: POLYPECTOMY;  Surgeon: Rogene Houston, MD;  [facility];  Service: Endoscopy;;  proximal transverse colon (CB x1), descending colon (CBx3)  . POSTERIOR CERVICAL FUSION/FORAMINOTOMY  08/18/2011   Procedure: POSTERIOR CERVICAL FUSION/FORAMINOTOMY LEVEL 3;  Surgeon: Ophelia Charter, MD;  Location: [facility] [facility];  Service: Neurosurgery;  Laterality: N/A;  Cervical three-four, four-five, five-six posterior cervical  fusion with instrumentation; Right cervical three-four, four-five laminotomy  . REVISION TOTAL HIP ARTHROPLASTY Left   . SHOULDER ARTHROSCOPY     rt bith defect , spur, rotator cuff  . SPINAL  CORD STIMULATOR IMPLANT     rt hip  . SPINAL CORD STIMULATOR INSERTION N/A 11/03/2016   Procedure: LUMBAR SPINAL CORD STIMULATOR REVISION;  Surgeon: Newman Pies, MD;  Location: [facility];  Service: Neurosurgery;  Laterality: N/A;  . THYROID LOBECTOMY     rt    Family History  Problem Relation Age of Onset  . Heart disease Father   . Aneurysm Mother   . Aneurysm Sister   . Heart disease Paternal Grandmother   . Heart disease Paternal Grandfather   . Heart disease Maternal Grandfather   . Heart disease Brother        heart transplant  . Aneurysm Other        pt states several members have aortic aneurysm  . Skin cancer Other        several members  . Lymphoma Brother   . Aneurysm Maternal Grandmother    Social History:  reports that he quit smoking about 40 years ago. His smoking use included cigarettes. He has a 40.00 pack-year smoking history. He has never used smokeless tobacco. He reports current alcohol use. He reports that he does not use drugs.  Allergies:  Allergies  Allergen Reactions  . Demerol Other (See Comments)    "Makes me crazy."  . Contrast Media [Iodinated Diagnostic Agents] Nausea Only    Medications Prior to Admission  Medication Sig Dispense Refill  . aspirin 81 MG EC tablet Take 81 mg by mouth 2 (two) times a week.    . Aspirin-Acetaminophen-Caffeine (GOODY HEADACHE PO) Take 1-2 packets by mouth daily as needed (headaches).    Marland Kitchen Bioflavonoid Products (ESTER-C) 500-550 MG TABS Take 1 tablet by mouth daily.    . calcium-vitamin D (OSCAL WITH D) 500-200 MG-UNIT per tablet Take 1 tablet by mouth 2 (two) times daily.     . cyclobenzaprine (FLEXERIL) 10 MG tablet Take 1 tablet (10 mg total) by mouth 3 (three) times daily as needed for muscle spasms. 50 tablet 1  . dexlansoprazole (DEXILANT) 60 MG capsule Take 60 mg by mouth daily.     . diazepam (VALIUM) 10 MG tablet Take 10 mg by mouth 4 (four) times daily as needed (for back pain).     . fexofenadine  (ALLEGRA) 180 MG tablet Take 180 mg by mouth every evening.    . fish oil-omega-3 fatty acids 1000 MG capsule Take 1 g by mouth 2 (two) times daily.    . isosorbide mononitrate (IMDUR) 30 MG 24 hr tablet Take 30 mg by mouth daily.    Marland Kitchen lisinopril (PRINIVIL,ZESTRIL) 10 MG tablet Take 10 mg by mouth daily.    . metoCLOPramide (REGLAN) 10 MG tablet Take 10 mg by mouth 2 (two) times daily.    . montelukast (SINGULAIR) 10 MG tablet Take 10 mg  by mouth daily.     . Multiple Vitamin (MULITIVITAMIN WITH MINERALS) TABS Take 1 tablet by mouth daily.    . ondansetron (ZOFRAN) 8 MG tablet Take 1 tablet (8 mg total) by mouth every 8 (eight) hours as needed for nausea or vomiting. 60 tablet 3  . oxyCODONE-acetaminophen (PERCOCET) 10-325 MG tablet Take 1 tablet by mouth every 6 (six) hours as needed for pain.    . ranolazine (RANEXA) 500 MG 12 hr tablet Take 500 mg by mouth 2 (two) times daily.    . rosuvastatin (CRESTOR) 10 MG tablet Take 10 mg by mouth every evening.     . sildenafil (VIAGRA) 100 MG tablet Take 50 mg by mouth as needed for erectile dysfunction.    . Prucalopride Succinate (MOTEGRITY) 2 MG TABS Take 1 tablet (2 mg total) by mouth daily before breakfast. 30 tablet 5    Results for orders placed or performed during the hospital encounter of 08/27/20 (from the past 48 hour(s))  CBC with Differential/Platelet     Status: Abnormal   Collection Time: 08/27/20 10:24 AM  Result Value Ref Range   WBC 2.4 (L) 4.0 - 10.5 K/uL   RBC 4.51 4.22 - 5.81 MIL/uL   Hemoglobin 14.0 13.0 - 17.0 g/dL   HCT 41.5 39.0 - 52.0 %   MCV 92.0 80.0 - 100.0 fL   MCH 31.0 26.0 - 34.0 pg   MCHC 33.7 30.0 - 36.0 g/dL   RDW 13.5 11.5 - 15.5 %   Platelets 68 (L) 150 - 400 K/uL    Comment: SPECIMEN CHECKED FOR CLOTS Immature Platelet Fraction may be clinically indicated, consider ordering this additional test [id number] PLATELET COUNT CONFIRMED BY SMEAR    nRBC 0.0 0.0 - 0.2 %   Neutrophils Relative % 52 %   Neutro  Abs 1.2 (L) 1.7 - 7.7 K/uL   Lymphocytes Relative 35 %   Lymphs Abs 0.9 0.7 - 4.0 K/uL   Monocytes Relative 12 %   Monocytes Absolute 0.3 0.1 - 1.0 K/uL   Eosinophils Relative 1 %   Eosinophils Absolute 0.0 0.0 - 0.5 K/uL   Basophils Relative 0 %   Basophils Absolute 0.0 0.0 - 0.1 K/uL   Immature Granulocytes 0 %   Abs Immature Granulocytes 0.01 0.00 - 0.07 K/uL    Comment: Performed at [facility], [address] [place], [place] [address]  Basic metabolic panel     Status: Abnormal   Collection Time: 08/27/20 10:24 AM  Result Value Ref Range   Sodium 134 (L) 135 - 145 mmol/L   Potassium 4.2 3.5 - 5.1 mmol/L   Chloride 104 98 - 111 mmol/L   CO2 18 (L) 22 - 32 mmol/L   Glucose, Bld 92 70 - 99 mg/dL    Comment: Glucose reference range applies only to samples taken after fasting for at least 8 hours.   BUN 18 8 - 23 mg/dL   Creatinine, Ser 1.32 (H) 0.61 - 1.24 mg/dL   Calcium 9.4 8.9 - 10.3 mg/dL   GFR, Estimated 58 (L) >60 mL/min    Comment: (NOTE) Calculated using the CKD-EPI Creatinine Equation (2021)    Anion gap 12 5 - 15    Comment: Performed at [facility], [address] [place], [place] [address]  SARS CORONAVIRUS 2 (TAT 6-24 HRS) Nasopharyngeal Nasopharyngeal Swab     Status: None   Collection Time: 08/27/20 10:24 AM   Specimen: Nasopharyngeal Swab  Result Value Ref Range   SARS  Coronavirus 2 NEGATIVE NEGATIVE    Comment: (NOTE) SARS-CoV-2 target nucleic acids are NOT DETECTED.  The SARS-CoV-2 RNA is generally detectable in upper and lower respiratory specimens during the acute phase of infection. Negative results do not preclude SARS-CoV-2 infection, do not rule out co-infections with other pathogens, and should not be used as the sole basis for treatment or other patient management decisions. Negative results must be combined with clinical observations, patient history, and epidemiological information. The expected result is Negative.  Fact Sheet for  Patients: SugarRoll.be  Fact Sheet for Healthcare Providers: https://www.woods-mathews.com/  This test is not yet approved or cleared by the [place] FDA and  has been authorized for detection and/or diagnosis of SARS-CoV-2 by FDA under an Emergency Use Authorization (EUA). This EUA will remain  in effect (meaning this test can be used) for the duration of the COVID-19 declaration under Se ction 564(b)(1) of the Act, 21 U.S.C. section 360bbb-3(b)(1), unless the authorization is terminated or revoked sooner.  Performed at [facility], [place] [address] [place], [place] [address]    No results found.  Review of Systems  Blood pressure (!) 144/93, pulse 82, temperature 97.9 F (36.6 C), temperature source Oral, resp. rate 16, SpO2 95 %. Physical Exam HENT:     Mouth/Throat:     Mouth: Mucous membranes are moist.     Pharynx: Oropharynx is clear.  Eyes:     General: No scleral icterus.    Conjunctiva/sclera: Conjunctivae normal.  Cardiovascular:     Rate and Rhythm: Normal rate and regular rhythm.     Heart sounds: Normal heart sounds. No murmur heard.   Pulmonary:     Effort: Pulmonary effort is normal.     Breath sounds: Normal breath sounds.  Abdominal:     Comments: Abdomen is full.  Long upper midline scar.  On palpation abdomen is soft and nontender with organomegaly or masses.  Musculoskeletal:        General: No swelling.     Cervical back: Neck supple.  Lymphadenopathy:     Cervical: No cervical adenopathy.  Skin:    General: Skin is warm and dry.  Neurological:     Mental Status: He is alert.      Assessment/Plan  Chronic GERD.  History of short segment Barrett's esophagus. History of gastroparesis. Symptom control unsatisfactory. Diagnostic esophagogastroduodenoscopy.  Hildred Laser, MD 08/29/2020, 9:23 AM

## 2020-08-29 NOTE — Anesthesia Procedure Notes (Signed)
Date/Time: 08/29/2020 9:43 AM Performed by: Orlie Dakin, CRNA Pre-anesthesia Checklist: Patient identified, Emergency Drugs available, Suction available and Patient being monitored Patient Re-evaluated:Patient Re-evaluated prior to induction Oxygen Delivery Method: Nasal cannula Induction Type: IV induction Placement Confirmation: positive ETCO2

## 2020-08-30 LAB — SURGICAL PATHOLOGY

## 2020-09-04 ENCOUNTER — Encounter (HOSPITAL_COMMUNITY): Payer: Self-pay | Admitting: Internal Medicine

## 2020-10-24 ENCOUNTER — Encounter (HOSPITAL_COMMUNITY): Payer: Self-pay | Admitting: *Deleted

## 2020-12-04 ENCOUNTER — Encounter (HOSPITAL_COMMUNITY): Payer: Self-pay | Admitting: Hematology and Oncology

## 2020-12-12 ENCOUNTER — Encounter (HOSPITAL_COMMUNITY): Payer: Self-pay | Admitting: Hematology and Oncology

## 2020-12-12 ENCOUNTER — Inpatient Hospital Stay (HOSPITAL_COMMUNITY): Payer: Medicare Other | Attending: Hematology

## 2020-12-12 ENCOUNTER — Other Ambulatory Visit: Payer: Self-pay

## 2020-12-12 DIAGNOSIS — K219 Gastro-esophageal reflux disease without esophagitis: Secondary | ICD-10-CM | POA: Insufficient documentation

## 2020-12-12 DIAGNOSIS — Z79899 Other long term (current) drug therapy: Secondary | ICD-10-CM | POA: Insufficient documentation

## 2020-12-12 DIAGNOSIS — Z87891 Personal history of nicotine dependence: Secondary | ICD-10-CM | POA: Diagnosis not present

## 2020-12-12 DIAGNOSIS — Z808 Family history of malignant neoplasm of other organs or systems: Secondary | ICD-10-CM | POA: Diagnosis not present

## 2020-12-12 DIAGNOSIS — R161 Splenomegaly, not elsewhere classified: Secondary | ICD-10-CM | POA: Insufficient documentation

## 2020-12-12 DIAGNOSIS — Z85820 Personal history of malignant melanoma of skin: Secondary | ICD-10-CM | POA: Diagnosis not present

## 2020-12-12 DIAGNOSIS — Z885 Allergy status to narcotic agent status: Secondary | ICD-10-CM | POA: Diagnosis not present

## 2020-12-12 DIAGNOSIS — Z8249 Family history of ischemic heart disease and other diseases of the circulatory system: Secondary | ICD-10-CM | POA: Insufficient documentation

## 2020-12-12 DIAGNOSIS — D696 Thrombocytopenia, unspecified: Secondary | ICD-10-CM | POA: Diagnosis present

## 2020-12-12 DIAGNOSIS — Z8719 Personal history of other diseases of the digestive system: Secondary | ICD-10-CM | POA: Diagnosis not present

## 2020-12-12 LAB — CBC WITH DIFFERENTIAL/PLATELET
Abs Immature Granulocytes: 0.26 10*3/uL — ABNORMAL HIGH (ref 0.00–0.07)
Basophils Absolute: 0 10*3/uL (ref 0.0–0.1)
Basophils Relative: 0 %
Eosinophils Absolute: 0 10*3/uL (ref 0.0–0.5)
Eosinophils Relative: 0 %
HCT: 46.6 % (ref 39.0–52.0)
Hemoglobin: 16.1 g/dL (ref 13.0–17.0)
Immature Granulocytes: 6 %
Lymphocytes Relative: 26 %
Lymphs Abs: 1.1 10*3/uL (ref 0.7–4.0)
MCH: 31.9 pg (ref 26.0–34.0)
MCHC: 34.5 g/dL (ref 30.0–36.0)
MCV: 92.3 fL (ref 80.0–100.0)
Monocytes Absolute: 0.4 10*3/uL (ref 0.1–1.0)
Monocytes Relative: 8 %
Neutro Abs: 2.7 10*3/uL (ref 1.7–7.7)
Neutrophils Relative %: 60 %
Platelets: 79 10*3/uL — ABNORMAL LOW (ref 150–400)
RBC: 5.05 MIL/uL (ref 4.22–5.81)
RDW: 12.9 % (ref 11.5–15.5)
WBC: 4.4 10*3/uL (ref 4.0–10.5)
nRBC: 0 % (ref 0.0–0.2)

## 2020-12-12 LAB — LACTATE DEHYDROGENASE: LDH: 182 U/L (ref 98–192)

## 2020-12-18 ENCOUNTER — Other Ambulatory Visit: Payer: Self-pay

## 2020-12-18 ENCOUNTER — Encounter (INDEPENDENT_AMBULATORY_CARE_PROVIDER_SITE_OTHER): Payer: Self-pay | Admitting: Internal Medicine

## 2020-12-18 ENCOUNTER — Encounter (HOSPITAL_COMMUNITY): Payer: Self-pay | Admitting: Hematology and Oncology

## 2020-12-18 ENCOUNTER — Ambulatory Visit (INDEPENDENT_AMBULATORY_CARE_PROVIDER_SITE_OTHER): Payer: Medicare Other | Admitting: Internal Medicine

## 2020-12-18 VITALS — BP 108/77 | HR 97 | Temp 98.8°F | Ht 72.0 in | Wt 244.0 lb

## 2020-12-18 DIAGNOSIS — K3184 Gastroparesis: Secondary | ICD-10-CM

## 2020-12-18 DIAGNOSIS — K219 Gastro-esophageal reflux disease without esophagitis: Secondary | ICD-10-CM

## 2020-12-18 DIAGNOSIS — K5900 Constipation, unspecified: Secondary | ICD-10-CM

## 2020-12-18 NOTE — Progress Notes (Signed)
Presenting complaint;  Follow for chronic GERD and gastroparesis and constipation.  Database and subjective:  Patient is 70 year old Caucasian male who has chronic GERD 3 failed antireflux surgeries history of short segment Barrett's esophagus possibly eradicated with multiple biopsies as well as history of gastroparesis and constipation who is here for scheduled visit.  He used to have diarrhea prior to developing constipation.  He is accompanied by his wife. He feels heartburn is well controlled with therapy.  He may have 1-3 episodes per month.  He has sporadic vomiting.  Last episode was 1 month ago when he vomited small amount of food debris.  No hematemesis reported.  He denies dysphagia.  He is not having any side effects with metoclopramide which she takes twice daily.  He was not able to get Motegrity from the [place] system.  He continues to complain of constipation.  He states nothing works.  He has tried MiraLAX as well as Linzess.  He is having 2 bowel movements per day.  He is trying to eat prunes raisins and other fiber rich foods.  He denies melena or rectal bleeding. His last colonoscopy was in September 2019 with removal of 4 small tubular adenomas. He remains with chronic back pain.  He states he is not able to do much physical activity. He states he received first dose of monoclonal antibody but his platelet count did not improve.  He has a follow-up visit with oncologist at [facility] and he feels he may need chemotherapy for his bone disorder.  Current Medications: Outpatient Encounter Medications as of 12/18/2020  Medication Sig   Aspirin-Acetaminophen-Caffeine (GOODY HEADACHE PO) Take 1-2 packets by mouth daily as needed (headaches).   Bioflavonoid Products (ESTER-C) 500-550 MG TABS Take 1 tablet by mouth daily.   calcium-vitamin D (OSCAL WITH D) 500-200 MG-UNIT per tablet Take 1 tablet by mouth 2 (two) times daily.    cyclobenzaprine (FLEXERIL) 10 MG tablet Take 1 tablet (10 mg total)  by mouth 3 (three) times daily as needed for muscle spasms.   dexlansoprazole (DEXILANT) 60 MG capsule Take 60 mg by mouth daily.    diazepam (VALIUM) 10 MG tablet Take 10 mg by mouth 4 (four) times daily as needed (for back pain).    fexofenadine (ALLEGRA) 180 MG tablet Take 180 mg by mouth every evening.   fish oil-omega-3 fatty acids 1000 MG capsule Take 1 g by mouth 2 (two) times daily.   isosorbide mononitrate (IMDUR) 30 MG 24 hr tablet Take 30 mg by mouth daily.   lisinopril (PRINIVIL,ZESTRIL) 10 MG tablet Take 10 mg by mouth daily.   metoCLOPramide (REGLAN) 10 MG tablet Take 10 mg by mouth 2 (two) times daily.   montelukast (SINGULAIR) 10 MG tablet Take 10 mg by mouth daily.    Multiple Vitamin (MULITIVITAMIN WITH MINERALS) TABS Take 1 tablet by mouth daily.   ondansetron (ZOFRAN) 8 MG tablet Take 1 tablet (8 mg total) by mouth every 8 (eight) hours as needed for nausea or vomiting.   oxyCODONE-acetaminophen (PERCOCET) 10-325 MG tablet Take 1 tablet by mouth every 6 (six) hours as needed for pain.   ranolazine (RANEXA) 500 MG 12 hr tablet Take 500 mg by mouth 2 (two) times daily.   rosuvastatin (CRESTOR) 10 MG tablet Take 10 mg by mouth every evening.    sildenafil (VIAGRA) 100 MG tablet Take 50 mg by mouth as needed for erectile dysfunction.   [DISCONTINUED] aspirin 81 MG EC tablet Take 1 tablet (81 mg total) by mouth  2 (two) times a week.   [DISCONTINUED] Prucalopride Succinate (MOTEGRITY) 2 MG TABS Take 1 tablet (2 mg total) by mouth daily before breakfast.   No facility-administered encounter medications on file as of 12/18/2020.     Objective: Blood pressure 108/77, pulse 97, temperature 98.8 F (37.1 C), temperature source Oral, height 6' (1.829 m), weight 244 lb (110.7 kg). Patient is alert and in no acute distress. Is wearing a mask. Conjunctiva is pink. Sclera is nonicteric No tremors noted to the lips or tongue. Oropharyngeal mucosa is normal. No neck masses or  thyromegaly noted. Cardiac exam with regular rhythm normal S1 and S2. No murmur or gallop noted. Lungs are clear to auscultation. Abdomen is full.  He has upper midline scar.  On palpation abdomen is soft and nontender with organomegaly or masses. No LE edema or clubbing noted. No extremity tremors noted.  Labs/studies Results:   CBC Latest Ref Rng & Units 12/12/2020 08/27/2020 07/05/2020  WBC 4.0 - 10.5 K/uL 4.4 2.4(L) 2.1(L)  Hemoglobin 13.0 - 17.0 g/dL 16.1 14.0 13.3  Hematocrit 39.0 - 52.0 % 46.6 41.5 41.1  Platelets 150 - 400 K/uL 79(L) 68(L) 66(L)    CMP Latest Ref Rng & Units 08/27/2020 06/07/2020 05/29/2020  Glucose 70 - 99 mg/dL 92 99 95  BUN 8 - 23 mg/dL 18 23 25(H)  Creatinine 0.61 - 1.24 mg/dL 1.32(H) 1.42(H) 1.35(H)  Sodium 135 - 145 mmol/L 134(L) 136 137  Potassium 3.5 - 5.1 mmol/L 4.2 4.4 4.1  Chloride 98 - 111 mmol/L 104 100 102  CO2 22 - 32 mmol/L 18(L) 25 25  Calcium 8.9 - 10.3 mg/dL 9.4 9.1 9.2  Total Protein 6.5 - 8.1 g/dL - 8.2(H) -  Total Bilirubin 0.3 - 1.2 mg/dL - 0.7 -  Alkaline Phos 38 - 126 U/L - 60 -  AST 15 - 41 U/L - 40 -  ALT 0 - 44 U/L - 29 -    Hepatic Function Latest Ref Rng & Units 06/07/2020 05/22/2020 05/09/2020  Total Protein 6.5 - 8.1 g/dL 8.2(H) 8.0 6.7  Albumin 3.5 - 5.0 g/dL 4.3 3.7 4.1  AST 15 - 41 U/L 40 61(H) 27  ALT 0 - 44 U/L 29 42 24  Alk Phosphatase 38 - 126 U/L 60 58 67  Total Bilirubin 0.3 - 1.2 mg/dL 0.7 1.0 0.8   Assessment:  #1.  Chronic GERD and gastroparesis.  He is doing well with dietary measures PPI and low-dose promotility agent.  He is not having any side effects with metoclopramide.  #2.  Chronic constipation.  Constipation appears to be multifactorial.  He may want to add almonds and walnuts to his snacks and see if it helps.  Will consider other treatment options if constipation gets any worse.  In the meantime he can use Dulcolax suppository on as-needed basis.  #3.  History of colonic adenomas.  Last colonoscopy was  in September 2019 and next 1 due in September 2024.  Plan:  Continue antireflux measures, Dexilant and metoclopramide as before. Use Dulcolax suppository on as-needed basis. Eat almonds and walnuts as snacks as it might help with defecation. Discontinue metoclopramide if you feel you are having any side effects and also call office. Office visit in 6 months.

## 2020-12-18 NOTE — Patient Instructions (Signed)
Can use Dulcolax suppository on as-needed basis for constipation If you experience any side effects with metoclopramide stop medication and call office.

## 2020-12-19 ENCOUNTER — Inpatient Hospital Stay (HOSPITAL_BASED_OUTPATIENT_CLINIC_OR_DEPARTMENT_OTHER): Payer: Medicare Other | Admitting: Hematology and Oncology

## 2020-12-19 ENCOUNTER — Encounter (HOSPITAL_COMMUNITY): Payer: Self-pay | Admitting: Hematology and Oncology

## 2020-12-19 DIAGNOSIS — C8307 Small cell B-cell lymphoma, spleen: Secondary | ICD-10-CM

## 2020-12-19 DIAGNOSIS — D696 Thrombocytopenia, unspecified: Secondary | ICD-10-CM | POA: Diagnosis not present

## 2020-12-19 NOTE — Progress Notes (Signed)
[facility] FOLLOW-UP progress notes  Patient Care Team: Redmond School, MD as PCP - General (Internal Medicine) Derek Jack, MD as Consulting Physician (Hematology)  CHIEF COMPLAINTS/PURPOSE OF VISIT:  Thrombocytopenia, marginal zone lymphoma status posttreatment  HISTORY OF PRESENTING ILLNESS:  [name] 70 y.o. male is seen because his primary oncologist is not available The patient presented initially with thrombocytopenia He was noted to have splenomegaly He underwent significant evaluation including bone marrow aspirate and biopsy in January of this year with a diagnosis of marginal zone lymphoma He received rituximab treatment without difficulties or side effects His last treatment was in May He is doing very well No recent infection According to his follow-up with his other oncologist, his spleen is reducing in size The patient denies any recent signs or symptoms of bleeding such as spontaneous epistaxis, hematuria or hematochezia.   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   Coronary artery disease    mild by 09/08/13 cath [facility]   GERD (gastroesophageal reflux disease)    barretts esophagus   H/O hiatal hernia    Headache(784.0)    Hypercholesteremia    Hypertension    PONV (postoperative nausea and vomiting)    trouble breathing after last surgery (at [facility])   Shingles    10   Sleep apnea    not tested   Tumor liver    no problems    SURGICAL HISTORY: Past Surgical History:  Procedure Laterality Date   ABDOMINAL SURGERY     x3   back surgeries     x 9   BACK SURGERY     x8   BIOPSY N/A 03/28/2013   Procedure: Distal Esophageal Biopsy;  Surgeon: Rogene Houston, MD;  Location: [facility];   Service: Endoscopy;  Laterality: N/A;   BIOPSY  05/30/2016   Procedure: BIOPSY;  Surgeon: Rogene Houston, MD;  [facility];  Service: Endoscopy;;   BIOPSY  02/19/2018   Procedure: BIOPSY;  Surgeon: Rogene Houston, MD;  [facility];  Service: Endoscopy;;  Barret's esophagus   BIOPSY  08/29/2020   Procedure: BIOPSY;  Surgeon: Rogene Houston, MD;  [facility];  Service: Endoscopy;;   CARDIAC CATHETERIZATION  2020   CHEST SURGERY     lung incisional hernia for hernia   COLONOSCOPY WITH PROPOFOL N/A 02/19/2018   Procedure: COLONOSCOPY WITH PROPOFOL;  Surgeon: Rogene Houston, MD;  [facility];  Service: Endoscopy;  Laterality: N/A;  1:00   CORONARY ANGIOPLASTY WITH STENT PLACEMENT  2020   2 blockages: 1 stent   ESOPHAGOGASTRODUODENOSCOPY (EGD) WITH PROPOFOL N/A 03/28/2013   Procedure: ESOPHAGOGASTRODUODENOSCOPY (EGD) WITH PROPOFOL;  Surgeon: Rogene Houston, MD;  Location: [facility];  Service: Endoscopy;  Laterality: N/A;  GE junction @ 38, proximal  margin @37    ESOPHAGOGASTRODUODENOSCOPY (EGD) WITH PROPOFOL N/A 05/30/2016   Procedure: ESOPHAGOGASTRODUODENOSCOPY (EGD) WITH PROPOFOL;  Surgeon: Rogene Houston, MD;  [facility];  Service: Endoscopy;  Laterality: N/A;  955   ESOPHAGOGASTRODUODENOSCOPY (EGD) WITH PROPOFOL N/A 02/19/2018   Procedure: ESOPHAGOGASTRODUODENOSCOPY (EGD) WITH PROPOFOL;  Surgeon: Rogene Houston,  MD;  [facility];  Service: Endoscopy;  Laterality: N/A;   ESOPHAGOGASTRODUODENOSCOPY (EGD) WITH PROPOFOL N/A 08/29/2020   Procedure: ESOPHAGOGASTRODUODENOSCOPY (EGD) WITH PROPOFOL;  Surgeon: Rogene Houston, MD;  [facility];  Service: Endoscopy;  Laterality: N/A;  AM   HEMORRHOID SURGERY     x2   HERNIA REPAIR     HIATAL HERNIA REPAIR     JOINT REPLACEMENT     lft knee   KNEE ARTHROPLASTY     lft   KNEE ARTHROSCOPY Right    LEG SURGERY     x4 crushed    LITHOTRIPSY     MELANOMA EXCISION      lft abdomen   NECK SURGERY  2012   POLYPECTOMY  02/19/2018   Procedure: POLYPECTOMY;  Surgeon: Rogene Houston, MD;  [facility];  Service: Endoscopy;;  proximal transverse colon (CB x1), descending colon (CBx3)   POSTERIOR CERVICAL FUSION/FORAMINOTOMY  08/18/2011   Procedure: POSTERIOR CERVICAL FUSION/FORAMINOTOMY LEVEL 3;  Surgeon: Ophelia Charter, MD;  Location: [facility];  Service: Neurosurgery;  Laterality: N/A;  Cervical three-four, four-five, five-six posterior cervical  fusion with instrumentation; Right cervical three-four, four-five laminotomy   REVISION TOTAL HIP ARTHROPLASTY Left    SHOULDER ARTHROSCOPY     rt bith defect , spur, rotator cuff   SPINAL CORD STIMULATOR IMPLANT     rt hip   SPINAL CORD STIMULATOR INSERTION N/A 11/03/2016   Procedure: LUMBAR SPINAL CORD STIMULATOR REVISION;  Surgeon: Newman Pies, MD;  Location: [facility];  Service: Neurosurgery;  Laterality: N/A;   THYROID LOBECTOMY     rt    SOCIAL HISTORY: Social History   Socioeconomic History   Marital status: Married    Spouse name: Not on file   Number of children: Not on file   Years of education: Not on file   Highest education level: Not on file  Occupational History   Occupation: retired  Tobacco Use   Smoking status: Former    Packs/day: 4.00    Years: 10.00    Pack years: 40.00    Types: Cigarettes    Quit date: 08/12/1980    Years since quitting: 40.3   Smokeless tobacco: Never   Tobacco comments:    occ wine  Vaping Use   Vaping Use: Never used  Substance and Sexual Activity   Alcohol use: Yes    Alcohol/week: 0.0 standard drinks    Comment: rarely   Drug use: No   Sexual activity: Yes    Birth control/protection: None  Other Topics Concern   Not on file  Social History Narrative   Not on file   Social Determinants of Health   Financial Resource Strain: Low Risk    Difficulty of Paying Living Expenses: Not hard at all  Food Insecurity: No Food  Insecurity   Worried About Charity fundraiser in the Last Year: Never true   [place] in the Last Year: Never true  Transportation Needs: No Transportation Needs   Lack of Transportation (Medical): No   Lack of Transportation (Non-Medical): No  Physical Activity: Inactive   Days of Exercise per Week: 0 days   Minutes of Exercise per Session: 0 min  Stress: No Stress Concern Present   Feeling of Stress : Not at all  Social Connections: Moderately Integrated   Frequency of Communication with Friends and Family: More than three times a week   Frequency of Social Gatherings with Friends  and Family: Once a week   Attends Religious Services: More than 4 times per year   Active Member of Clubs or Organizations: No   Attends Music therapist: Never   Marital Status: Married  Human resources officer Violence: Not At Risk   Fear of Current or Ex-Partner: No   Emotionally Abused: No   Physically Abused: No   Sexually Abused: No    FAMILY HISTORY: Family History  Problem Relation Age of Onset   Heart disease Father    Aneurysm Mother    Aneurysm Sister    Heart disease Paternal Grandmother    Heart disease Paternal Grandfather    Heart disease Maternal Grandfather    Heart disease Brother        heart transplant   Aneurysm Other        pt states several members have aortic aneurysm   Skin cancer Other        several members   Lymphoma Brother    Aneurysm Maternal Grandmother     ALLERGIES:  is allergic to demerol and contrast media [iodinated diagnostic agents].  MEDICATIONS:  Current Outpatient Medications  Medication Sig Dispense Refill   Bioflavonoid Products (ESTER-C) 500-550 MG TABS Take 1 tablet by mouth daily.     calcium-vitamin D (OSCAL WITH D) 500-200 MG-UNIT per tablet Take 1 tablet by mouth 2 (two) times daily.      dexlansoprazole (DEXILANT) 60 MG capsule Take 60 mg by mouth daily.      diazepam (VALIUM) 10 MG tablet Take 10 mg by mouth 4 (four)  times daily as needed (for back pain).      fexofenadine (ALLEGRA) 180 MG tablet Take 180 mg by mouth every evening.     fish oil-omega-3 fatty acids 1000 MG capsule Take 1 g by mouth 2 (two) times daily.     hydrocortisone (CORTEF) 10 MG tablet Take by mouth.     isosorbide mononitrate (IMDUR) 30 MG 24 hr tablet Take 30 mg by mouth daily.     lisinopril (PRINIVIL,ZESTRIL) 10 MG tablet Take 10 mg by mouth daily.     montelukast (SINGULAIR) 10 MG tablet Take 10 mg by mouth daily.      Multiple Vitamin (MULITIVITAMIN WITH MINERALS) TABS Take 1 tablet by mouth daily.     ondansetron (ZOFRAN) 8 MG tablet Take by mouth.     ranolazine (RANEXA) 500 MG 12 hr tablet Take 500 mg by mouth 2 (two) times daily.     rosuvastatin (CRESTOR) 10 MG tablet Take 10 mg by mouth every evening.      sildenafil (VIAGRA) 100 MG tablet Take 50 mg by mouth as needed for erectile dysfunction.     Aspirin-Acetaminophen-Caffeine (GOODY HEADACHE PO) Take 1-2 packets by mouth daily as needed (headaches). (Patient not taking: Reported on 12/19/2020)     cyclobenzaprine (FLEXERIL) 10 MG tablet Take 1 tablet (10 mg total) by mouth 3 (three) times daily as needed for muscle spasms. (Patient not taking: Reported on 12/19/2020) 50 tablet 1   oxyCODONE-acetaminophen (PERCOCET) 10-325 MG tablet Take 1 tablet by mouth every 6 (six) hours as needed for pain. (Patient not taking: Reported on 12/19/2020)     No current facility-administered medications for this visit.    REVIEW OF SYSTEMS:   Constitutional: Denies fevers, chills or abnormal night sweats Eyes: Denies blurriness of vision, double vision or watery eyes Ears, nose, mouth, throat, and face: Denies mucositis or sore throat Respiratory: Denies cough, dyspnea or wheezes Cardiovascular: Denies  palpitation, chest discomfort or lower extremity swelling Gastrointestinal:  Denies nausea, heartburn or change in bowel habits Skin: Denies abnormal skin rashes Lymphatics: Denies new  lymphadenopathy or easy bruising Neurological:Denies numbness, tingling or new weaknesses Behavioral/Psych: Mood is stable, no new changes  All other systems were reviewed with the patient and are negative.  PHYSICAL EXAMINATION: ECOG PERFORMANCE STATUS: 0 - Asymptomatic  Vitals:   12/19/20 1443  BP: 113/82  Pulse: 86  Resp: 16  Temp: 98.3 F (36.8 C)  SpO2: 94%   Filed Weights   12/19/20 1443  Weight: 246 lb 9.6 oz (111.9 kg)    GENERAL:alert, no distress and comfortable PSYCH: alert & oriented x 3 with fluent speech NEURO: no focal motor/sensory deficits  LABORATORY DATA:  I have reviewed the data as listed Lab Results  Component Value Date   WBC 4.4 12/12/2020   HGB 16.1 12/12/2020   HCT 46.6 12/12/2020   MCV 92.3 12/12/2020   PLT 79 (L) 12/12/2020   Recent Labs    01/18/20 1304 01/18/20 1304 05/09/20 1048 05/22/20 1254 05/29/20 1006 06/07/20 0928 08/27/20 1024  NA 137  --  137 135 137 136 134*  K 4.3  --  3.8 3.5 4.1 4.4 4.2  CL 106  --  105 105 102 100 104  CO2 23  --  23 21* 25 25 18*  GLUCOSE 115*  --  138* 123* 95 99 92  BUN 16  --  23 22 25* 23 18  CREATININE 1.31*  --  1.46* 1.13 1.35* 1.42* 1.32*  CALCIUM 9.1  --  9.2 8.9 9.2 9.1 9.4  GFRNONAA 56*   < > 52* >60 57* 54* 58*  GFRAA >60  --   --   --   --   --   --   PROT 7.1  --  6.7 8.0  --  8.2*  --   ALBUMIN 4.4  --  4.1 3.7  --  4.3  --   AST 27  --  27 61*  --  40  --   ALT 22  --  24 42  --  29  --   ALKPHOS 59  --  67 58  --  60  --   BILITOT 0.8  --  0.8 1.0  --  0.7  --    < > = values in this interval not displayed.   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   All questions were answered. The patient knows to call the clinic with any problems, questions or concerns. The total time spent in the appointment was 20 minutes encounter with patients including review of chart and various tests results, discussions about plan of care and coordination of care plan   Heath Lark, MD 12/19/2020 4:53 PM

## 2020-12-19 NOTE — Assessment & Plan Note (Addendum)
He has completed rituximab treatment in May He is scheduled for repeat PET CT scan in another facility I would defer to his oncologist for further management He is very concerned about his overall prognosis I reassured the patient that prognosis for marginal zone lymphoma is excellent

## 2020-12-19 NOTE — Assessment & Plan Note (Signed)
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

## 2020-12-24 ENCOUNTER — Other Ambulatory Visit (HOSPITAL_COMMUNITY): Payer: Self-pay

## 2020-12-24 DIAGNOSIS — C8307 Small cell B-cell lymphoma, spleen: Secondary | ICD-10-CM

## 2020-12-24 DIAGNOSIS — D709 Neutropenia, unspecified: Secondary | ICD-10-CM

## 2020-12-24 DIAGNOSIS — D696 Thrombocytopenia, unspecified: Secondary | ICD-10-CM

## 2021-01-23 ENCOUNTER — Other Ambulatory Visit: Payer: Self-pay

## 2021-01-23 ENCOUNTER — Encounter (HOSPITAL_COMMUNITY): Payer: Self-pay

## 2021-01-23 ENCOUNTER — Ambulatory Visit (HOSPITAL_COMMUNITY): Payer: Medicare Other | Attending: Internal Medicine

## 2021-01-23 DIAGNOSIS — R29898 Other symptoms and signs involving the musculoskeletal system: Secondary | ICD-10-CM | POA: Insufficient documentation

## 2021-01-23 NOTE — Therapy (Addendum)
[facility] [address] [place], [place], [address] Phone: [phone]   Fax:  [phone]  Occupational Therapy Wheelchair Evaluation  Patient Details  Name: [name] MRN: [id number] Date of Birth: 11/15/1950 Referring Provider (OT): Redmond School, MD   Encounter Date: 01/23/2021   OT End of Session - 01/23/21 1050     Visit Number 1    Number of Visits 1    Authorization Type Medicare    Progress Note Due on Visit 10    OT Start Time 0945    OT Stop Time 6503    OT Time Calculation (min) 60 min    Activity Tolerance Patient tolerated treatment well    Behavior During Therapy 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   Coronary artery disease    mild by 09/08/13 cath [facility]   GERD (gastroesophageal reflux disease)    barretts esophagus   H/O hiatal hernia    Headache(784.0)    Hypercholesteremia    Hypertension    PONV (postoperative nausea and vomiting)    trouble breathing after last surgery (at [facility])   Shingles    10   Sleep apnea    not tested   Tumor liver    no problems    Past Surgical History:  Procedure Laterality Date   ABDOMINAL SURGERY     x3   back surgeries     x 9   BACK SURGERY     x8   BIOPSY N/A 03/28/2013   Procedure: Distal Esophageal Biopsy;  Surgeon: Rogene Houston, MD;  Location: [facility];  Service: Endoscopy;  Laterality: N/A;   BIOPSY  05/30/2016   Procedure: BIOPSY;  Surgeon: Rogene Houston, MD;  [facility]: [facility];  Service: Endoscopy;;   BIOPSY  02/19/2018   Procedure: BIOPSY;  Surgeon: Rogene Houston, MD;  [facility];  Service: Endoscopy;;  Barret's esophagus   BIOPSY  08/29/2020    Procedure: BIOPSY;  Surgeon: Rogene Houston, MD;  [facility];  Service: Endoscopy;;   CARDIAC CATHETERIZATION  2020   CHEST SURGERY     lung incisional hernia for hernia   COLONOSCOPY WITH PROPOFOL N/A 02/19/2018   Procedure: COLONOSCOPY WITH PROPOFOL;  Surgeon: Rogene Houston, MD;  [facility];  Service: Endoscopy;  Laterality: N/A;  1:00   CORONARY ANGIOPLASTY WITH STENT PLACEMENT  2020   2 blockages: 1 stent   ESOPHAGOGASTRODUODENOSCOPY (EGD) WITH PROPOFOL N/A 03/28/2013   Procedure: ESOPHAGOGASTRODUODENOSCOPY (EGD) WITH PROPOFOL;  Surgeon: Rogene Houston, MD;  Location: [facility];  Service: Endoscopy;  Laterality: N/A;  GE junction @ 38, proximal  margin @37    ESOPHAGOGASTRODUODENOSCOPY (EGD) WITH PROPOFOL N/A 05/30/2016   Procedure: ESOPHAGOGASTRODUODENOSCOPY (EGD) WITH PROPOFOL;  Surgeon: Rogene Houston, MD;  [facility];  Service: Endoscopy;  Laterality: N/A;  955   ESOPHAGOGASTRODUODENOSCOPY (EGD) WITH PROPOFOL N/A 02/19/2018   Procedure: ESOPHAGOGASTRODUODENOSCOPY (EGD) WITH PROPOFOL;  Surgeon: Rogene Houston, MD;  [facility];  Service: Endoscopy;  Laterality: N/A;  ESOPHAGOGASTRODUODENOSCOPY (EGD) WITH PROPOFOL N/A 08/29/2020   Procedure: ESOPHAGOGASTRODUODENOSCOPY (EGD) WITH PROPOFOL;  Surgeon: Rogene Houston, MD;  [facility];  Service: Endoscopy;  Laterality: N/A;  AM   HEMORRHOID SURGERY     x2   HERNIA REPAIR     HIATAL HERNIA REPAIR     JOINT REPLACEMENT     lft knee   KNEE ARTHROPLASTY     lft   KNEE ARTHROSCOPY Right    LEG SURGERY     x4 crushed    LITHOTRIPSY     MELANOMA EXCISION     lft abdomen   NECK SURGERY  2012   POLYPECTOMY  02/19/2018   Procedure: POLYPECTOMY;  Surgeon: Rogene Houston, MD;  [facility];  Service: Endoscopy;;  proximal transverse colon (CB x1), descending colon (CBx3)   POSTERIOR CERVICAL FUSION/FORAMINOTOMY  08/18/2011   Procedure: POSTERIOR CERVICAL  FUSION/FORAMINOTOMY LEVEL 3;  Surgeon: Ophelia Charter, MD;  Location: [facility];  Service: Neurosurgery;  Laterality: N/A;  Cervical three-four, four-five, five-six posterior cervical  fusion with instrumentation; Right cervical three-four, four-five laminotomy   REVISION TOTAL HIP ARTHROPLASTY Left    SHOULDER ARTHROSCOPY     rt bith defect , spur, rotator cuff   SPINAL CORD STIMULATOR IMPLANT     rt hip   SPINAL CORD STIMULATOR INSERTION N/A 11/03/2016   Procedure: LUMBAR SPINAL CORD STIMULATOR REVISION;  Surgeon: Newman Pies, MD;  Location: [facility];  Service: Neurosurgery;  Laterality: N/A;   THYROID LOBECTOMY     rt    There were no vitals filed for this visit.      [facility] OT Assessment - 01/23/21 1050       Assessment   Medical Diagnosis Wheelchair evaluation    Referring Provider (OT) Redmond School, MD      Precautions   Precautions Fall                   Mobility/Seating Evaluation    PATIENT INFORMATION: Name: [name] DOB: [birth year]  Sex: Male Date seen: 01/23/21 Time: 9:45AM  Address:  [address], [place], [place] [address] Physician: Redmond School, MD This evaluation/justification form will serve as the LMN for the following suppliers: __________________________ Supplier: [employer] Contact Person: Casper Harrison Phone:  [phone]   Seating Therapist: Ailene Cochran, [place] Phone:    [phone]   Phone: [phone]    Spouse/Parent/Caregiver name: [name]  Phone number: Same as above Insurance/Payer: 1) Medicare 2) [place]     Reason for Referral: Need for powered wheelchair  Patient/Caregiver Goals: To be able to move in his environment safely and independently while completing activities of daily living.   Patient was seen for face-to-face evaluation for new power wheelchair.  Merrily Pew Cadle, ATP with [facility] will schedule a home visit with patient to assess wheelchair specification needs. Further paperwork was completed and  sent to vendor.  Patient appears to qualify for power mobility device at this time per objective findings.   MEDICAL HISTORY: Diagnosis: Primary Diagnosis: Lymphoma Onset: March 2022 Diagnosis: Bilateral shoulder pain, Decreased activity tolerance, Decreased balance, Back pain, Arthritis, Sleep Apnea, GERD, HTN, Coronary Artery Disease, Thrombocytopenia   [x] Progressive Disease Relevant past and future surgeries: Cervical fusion C2-C6 (2012), Back surgeries X9, Right knee arthroscopy, Cardiac Catherization (2020), Right Thyroid lobectomy, Spinal cord stimulator implant (2018), Right RTC arthroscopy   Height: 6'0" Weight: 241# Explain recent changes or trends in weight: N/A   History including Falls:  Couple years ago experienced a fall off of his deck. He reports that he has a close call on a daily basis.     HOME ENVIRONMENT: [x] House  [] Condo/town home  [] Apartment  [] Assisted Living    [] Lives Alone [x]  Lives with Others                                                                                          Hours with caregiver: N/A  [x] Home is accessible to patient           Stairs      [x] Yes []  No     Ramp [] Yes [x] No Comments:  5 steps with a railing in the back. 2 steps and no railing in the front.    COMMUNITY ADL: TRANSPORTATION: [x] Car    [] Van    [] Public Transportation    [] Adapted w/c Lift    [] Ambulance    [] Other:       [] Sits in wheelchair during transport  Employment/School: Retired Specific requirements pertaining to mobility N/A  Other: N/A    FUNCTIONAL/SENSORY PROCESSING SKILLS:  Handedness:   [x] Right     [] Left    [] NA  Comments:    Neurosurgeon for Wheeled Mobility [x] Processing Skills are adequate for safe wheelchair operation  Areas of concern than may interfere with safe operation of wheelchair Description of problem   []  Attention to environment      [] Judgment      []  Hearing  []  Vision or visual processing      [] Motor Planning  []   Fluctuations in Behavior  N/A    VERBAL COMMUNICATION: [x] WFL receptive [x]  WFL expressive [x] Understandable  [] Difficult to understand  [] non-communicative []  Uses an augmented communication device  CURRENT SEATING / MOBILITY: Current Mobility Base:  [x] None [] Dependent [] Manual [] Scooter [] Power  Type of Control:   Manufacturer:  Size:  Age:   Current Condition of Mobility Base:     Current Wheelchair components:    Describe posture in present seating system:        SENSATION and SKIN ISSUES: Sensation [x] Intact  [] Impaired [] Absent  Level of sensation:  Pressure Relief: Able to perform effective pressure relief :    [] Yes  [x]  No Method:  If not, Why?: Due to decreased BUE strength weight shifting ability is limited and not adequate to what is needed to prevent skin breakdown.  Skin Issues/Skin Integrity Current Skin Issues  [] Yes [x] No [] Intact []  Red area[]  Open Area  [] Scar Tissue [] At risk from prolonged sitting Where    History of Skin Issues  [x] Yes [] No Where  Bilateral arms When  Occassional skin irritation and bumps.  Hx of skin flap surgeries  [] Yes [x] No Where   When    Limited sitting tolerance [x] Yes [] No Hours spent sitting in wheelchair daily: 30 minutes-1 hour depending on his seat/support provided. LImited by his chornic back pain.   Complaint of Pain:  Please describe: Experiences constant pain in his back, neck, and shoulders. 5/10 at rest 10/10 with activity.   Swelling/Edema: N/A   ADL STATUS (in reference to wheelchair use):  Indep Assist Unable Indep with Equip  Not assessed Comments  Dressing []  [x]  []  []  []  Wife assists with socks and shoes.   Eating [x]  []  []  []  []    Toileting [x]  []  []  []  []  Uses elevated toilet seat/handicap height.  Bathing [x]  []  []  []  []  Walk-in shower with grab bars and attached shower chair.  Grooming/Hygiene [x]  []  []  []  []    Meal Prep []  []  [x]  []  []  Wife completes  IADLS []  []  [x]  []  []  Wife completes  Bowel  Management: [x] Continent  [] Incontinent  [] Accidents Comments:  frequently experiences constipation issues.  Bladder Management: [x] Continent  [] Incontinent  [] Accidents Comments:       WHEELCHAIR SKILLS: Manual w/c Propulsion: [] UE or LE strength and endurance sufficient to participate in ADLs using manual wheelchair Arm : [] left [] right   [] Both      Distance:  Foot:  [] left [] right   [] Both  Operate Scooter: []  Strength, hand grip, balance and transfer appropriate for use [] Living environment is accessible for use of scooter  Operate Power w/c:  []  Std. Joystick   []  Alternative Controls Indep [x]  Assist []  Dependent/unable []  N/A []   [x] Safe          [x]  Functional      Distance:   Bed confined without wheelchair []  Yes [x]  No   STRENGTH/RANGE OF MOTION:  BUE Active Range of Motion Strength  Shoulder BUE A/ROM shoulder flexion, abduction, IR/er is limited Bilateral flexion: 4/5, abduction: 4/5, IR/er: 3/5  Elbow BUE elbow flexion/extension: WFL Bilateral elbow flexion: 4+/5, extension: 3/5  Wrist/Hand BUE wrist flexion/extension WFL Bilateral wrist flexion/extension: 4/5. bilateral gross grasp is impaired.  Hip Bilateral hip flexion is limited and lacking functional ROM.  right hip flexion: 4-/5, left hip flexion: 3/5  Knee Bilateral knee flexion/extension: WFL right knee flexion/extension: 4-/5, left knee flexion/extension: 3+/5  Ankle Bilateral ankle dorsiflexion and plantar flexion: WFL Right plantar and dorsiflexion: 3+/, Left plantar and dorsiflexion: 3+/5     MOBILITY/BALANCE:  []  Patient is totally dependent for mobility      Balance Transfers Ambulation  Sitting Balance: Standing Balance: [x]  Independent []  Independent/Modified Independent  [x]  WFL     [x]  WFL. Only able to tolerate 1 minute max of standing.  []  Supervision []  Supervision  []  Uses UE for balance  []  Supervision []  Min Assist []  Ambulates with Assist      []  Min Assist []  Min assist []  Mod Assist []   Ambulates with Device:      []  RW  []  StW  []  Cane  []    []  Mod Assist []  Mod assist []  Max assist   []  Max Assist []  Max assist []  Dependent [x]  Indep. Short Distance Only  []  Unable []  Unable []  Lift / Sling Required Distance (in feet)  30-40 feet   []  Sliding board []  Unable to Ambulate (see explanation below)  Cardio Status:  [] Intact  [x]  Impaired   []  NA     Hx of cardiac issues - blockages  Respiratory Status:  [] Intact   [x] Impaired   [] NA     when experiencing pain, patient reports difficulty breathing and sweating.  Orthotics/Prosthetics: N/A  Comments (Address manual vs power w/c vs scooter): Current home environment does not have the adequate space for turning radius that is required with a scooter. Due to decreased bilateral UE strength, patient is unable to propel himself in a manual wheelchair. Patient is able to complete all functional transfer from surface to surface such as chair to chair (if elevated) at  modified independence, increased time, and use of BUE to push up.  TUG performed: 28.2" with no assistive device.         Anterior / Posterior Obliquity Rotation-Pelvis   PELVIS    [x]  []  []   Neutral Posterior Anterior  [x]  []  []   WFL Rt elev Lt elev  [x]  []  []   WFL Right Left                      Anterior    Anterior     []  Fixed []  Other []  Partly Flexible []  Flexible   []  Fixed []  Other []  Partly Flexible  []  Flexible  []  Fixed []  Other []  Partly Flexible  []  Flexible   TRUNK  [x]  []  []   WFL  Thoracic  Lumbar  Kyphosis Lordosis  [x]  []  []   WFL Convex Convex  Right Left [] c-curve [] s-curve [] multiple  [x]  Neutral []  Left-anterior []  Right-anterior     []  Fixed []  Flexible []  Partly Flexible []  Other  []  Fixed []  Flexible []  Partly Flexible []  Other  []  Fixed             []  Flexible []  Partly Flexible []  Other    Position Windswept    HIPS          [x]            []               []    Neutral       Abduct        ADduct         [x]           []             []   Neutral Right           Left      []  Fixed []  Subluxed []  Partly Flexible []  Dislocated []  Flexible  []  Fixed []  Other []  Partly Flexible  []  Flexible                 Foot Positioning Knee Positioning      [x]  WFL  [] Lt [] Rt [x]  WFL  [] Lt [] Rt    KNEES ROM concerns: ROM concerns:    & Dorsi-Flexed [] Lt [] Rt     FEET Plantar Flexed [] Lt [] Rt      Inversion                 [] Lt [] Rt      Eversion                 [] Lt [] Rt     HEAD [x]  Functional []  Good Head Control    & []  Flexed         []  Extended [x]  Adequate Head Control    NECK []  Rotated  Lt  []  Lat Flexed Lt []  Rotated  Rt []  Lat Flexed Rt []  Limited Head Control     []  Cervical Hyperextension []  Absent  Head Control     SHOULDERS ELBOWS WRIST& HAND       Left     Right    Left     Right    Left     Right   U/E [x] Functional           [x] Functional functional functional [] Fisting             [] Fisting      [] elev   [] dep      [] elev   []   dep       [] pro -[] retract     [] pro  [] retract [] subluxed             [] subluxed           Goals for Wheelchair Mobility  [x]  Independence with mobility in the home with motor related ADLs (MRADLs)  [x]  Independence with MRADLs in the community []  Provide dependent mobility  []  Provide recline     [] Provide tilt   Goals for Seating system [x]  Optimize pressure distribution []  Provide support needed to facilitate function or safety []  Provide corrective forces to assist with maintaining or improving posture []  Accommodate client's posture:   current seated postures and positions are not flexible or will not tolerate corrective forces []  Client to be independent with relieving pressure in the wheelchair [] Enhance physiological function such as breathing, swallowing, digestion  Simulation ideas/Equipment trials: State why other equipment was unsuccessful:   MOBILITY BASE RECOMMENDATIONS and JUSTIFICATION: MOBILITY COMPONENT JUSTIFICATION  Manufacturer: English as a second language teacher:  [employer]   Size: Width 18Seat Depth 20 [x] provide transport from point A to B      [x] promote Indep mobility  [x] is not a safe, functional ambulator [] walker or cane inadequate [] non-standard width/depth necessary to accommodate anatomical measurement []    [] Manual Mobility Base [] non-functional ambulator    [] Scooter/POV  [] can safely operate  [] can safely transfer   [] has adequate trunk stability  [] cannot functionally propel manual w/c  [x] Power Mobility Base  [] non-ambulatory  [x] cannot functionally propel manual wheelchair  []  cannot functionally and safely operate scooter/POV [x] can safely operate and willing to  [] Stroller Base [] infant/child  [] unable to propel manual wheelchair [] allows for growth [] non-functional ambulator [] non-functional UE [] Indep mobility is not a goal at this time  [] Tilt  [] Forward [] Backward [] Powered tilt  [] Manual tilt  [] change position against gravitational force on head and shoulders  [] change position for pressure relief/cannot weight shift [] transfers  [] management of tone [] rest periods [] control edema [] facilitate postural control  []    [] Recline  [] Power recline on power base [] Manual recline on manual base  [] accommodate femur to back angle  [] bring to full recline for ADL care  [] change position for pressure relief/cannot weight shift [] rest periods [] repositioning for transfers or clothing/diaper /catheter changes [] head positioning  [] Lighter weight required [] self- propulsion  [] lifting []    [] Heavy Duty required [] user weight greater than 250# [] extreme tone/ over active movement [] broken frame on previous chair []    []  Back  []  Angle Adjustable []  Custom molded  [] postural control [] control of tone/spasticity [] accommodation of range of motion [] UE functional control [] accommodation for seating system []   [] provide lateral trunk support [] accommodate deformity [] provide posterior trunk support [] provide  lumbar/sacral support [] support trunk in midline [] Pressure relief over spinal processes  []  Seat Cushion Captain Seat [] impaired sensation  [] decubitus ulcers present [] history of pressure ulceration [] prevent pelvic extension [] low maintenance  [] stabilize pelvis  [] accommodate obliquity [] accommodate multiple deformity [] neutralize lower extremity position [x] increase pressure distribution []    []  Pelvic/thigh support  []  Lateral thigh guide []  Distal medial pad  []  Distal lateral pad []  pelvis in neutral [] accommodate pelvis []  position upper legs []  alignment []  accommodate ROM []  decr adduction [] accommodate tone [] removable for transfers [] decr abduction  []  Lateral trunk Supports []  Lt     []  Rt [] decrease lateral trunk leaning [] control tone [] contour for increased contact [] safety  [] accommodate asymmetry []    []  Mounting hardware  [] lateral trunk supports  [] back   [] seat [] headrest      []   thigh support [] fixed   [] swing away [] attach seat platform/cushion to w/c frame [] attach back cushion to w/c frame [] mount postural supports [] mount headrest  [] swing medial thigh support away [] swing lateral supports away for transfers  []      Armrests  [] fixed [x] adjustable height [] removable   [] swing away  [x] flip back   [] reclining [x] full length pads [] desk    [] pads tubular  [x] provide support with elbow at 90   [] provide support for w/c tray [x] change of height/angles for variable activities [] remove for transfers [x] allow to come closer to table top [] remove for access to tables []    Hangers/ Leg rests  [] 60 [] 70 [] 90 [] elevating [] heavy duty  [] articulating [] fixed [] lift off [] swing away     [] power [] provide LE support  [] accommodate to hamstring tightness [] elevate legs during recline   [] provide change in position for Legs [] Maintain placement of feet on footplate [] durability [] enable transfers [] decrease edema [] Accommodate lower leg length []     Foot support Footplate    [] Lt  []  Rt  []  Center mount [] flip up     [] depth/angle adjustable [] Amputee adapter    []  Lt     []  Rt [] provide foot support [] accommodate to ankle ROM [] transfers [] Provide support for residual extremity []  allow foot to go under wheelchair base []  decrease tone  []    []  Ankle strap/heel loops [] support foot on foot support [] decrease extraneous movement [] provide input to heel  [] protect foot  Tires: [] pneumatic  [] flat free inserts  [] solid  [] decrease maintenance  [] prevent frequent flats [] increase shock absorbency [] decrease pain from road shock [] decrease spasms from road shock []    []  Headrest  [] provide posterior head support [] provide posterior neck support [] provide lateral head support [] provide anterior head support [] support during tilt and recline [] improve feeding   [] improve respiration [] placement of switches [] safety  [] accommodate ROM  [] accommodate tone [] improve visual orientation  []  Anterior chest strap []  Vest []  Shoulder retractors  [] decrease forward movement of shoulder [] accommodation of TLSO [] decrease forward movement of trunk [] decrease shoulder elevation [] added abdominal support [] alignment [] assistance with shoulder control  []    Pelvic Positioner [] Belt [] SubASIS bar [] Dual Pull [] stabilize tone [] decrease falling out of chair/ **will not Decr potential for sliding due to pelvic tilting [] prevent excessive rotation [] pad for protection over boney prominence [] prominence comfort [] special pull angle to control rotation []    Upper Extremity Support [] L   []  R [] Arm trough    [] hand support []  tray       [] full tray [] swivel mount [] decrease edema      [] decrease subluxation   [] control tone   [] placement for AAC/Computer/EADL [] decrease gravitational pull on shoulders [] provide midline positioning [] provide support to increase UE function [] provide hand support in natural position [] provide  work surface   POWER WHEELCHAIR CONTROLS  [x] Proportional  [] Non-Proportional Type joystick [] Left  [x] Right [x] provides access for controlling wheelchair   [] lacks motor control to operate proportional drive control [] unable to understand proportional controls  Actuator Control Module  [] Single  [] Multiple   [] Allow the client to operate the power seat function(s) through the joystick control   [] Safety Reset Switches [] Used to change modes and stop the wheelchair when driving in latch mode    [] Upgraded Electronics   [] programming for accurate control [] progressive Disease/changing condition [] non-proportional drive control needed [] Needed in order to operate power seat functions through joystick control   [] Display box [] Allows user to see in which mode and drive the wheelchair is set  [] necessary for alternate  controls    [] Digital interface electronics [] Allows w/c to operate when using alternative drive controls  [] ASL Head Array [] Allows client to operate wheelchair  through switches placed in tri-panel headrest  [] Sip and puff with tubing kit [] needed to operate sip and puff drive controls  [] Upgraded tracking electronics [] increase safety when driving [] correct tracking when on uneven surfaces  [x] Mount for switches or joystick [x] Attaches switches to w/c  [x] Swing away for access or transfers [] midline for optimal placement [] provides for consistent access  [] Attendant controlled joystick plus mount [] safety [] long distance driving [] operation of seat functions [] compliance with transportation regulations []      Rear wheel placement/Axle adjustability [] None [] semi adjustable [] fully adjustable  [] improved UE access to wheels [] improved stability [] changing angle in space for improvement of postural stability [] 1-arm drive access [] amputee pad placement []    Wheel rims/ hand rims  [] metal  [] plastic coated [] oblique projections [] vertical projections [] Provide  ability to propel manual wheelchair  []  Increase self-propulsion with hand weakness/decreased grasp  Push handles [] extended  [] angle adjustable  [] standard [] caregiver access [] caregiver assist [] allows "hooking" to enable increased ability to perform ADLs or maintain balance  One armed device  [] Lt   [] Rt [] enable propulsion of manual wheelchair with one arm   []      Brake/wheel lock extension []  Lt   []  Rt [] increase indep in applying wheel locks   [] Side guards [] prevent clothing getting caught in wheel or becoming soiled []  prevent skin tears/abrasions  Battery: U1 x2 [x] to power wheelchair   Other:     The above equipment has a life- long use expectancy. Growth and changes in medical and/or functional conditions would be the exceptions. This is to certify that the therapist has no financial relationship with durable medical provider or manufacturer. The therapist will not receive remuneration of any kind for the equipment recommended in this evaluation.   Patient has mobility limitation that significantly impairs safe, timely participation in one or more mobility related ADL's.  (bathing, toileting, feeding, dressing, grooming, moving from room to room)                                                             [x]  Yes []  No Will mobility device sufficiently improve ability to participate and/or be aided in participation of MRADL's?         [x]  Yes []  No Can limitation be compensated for with use of a cane or walker?                                                                                []  Yes [x]  No Does patient or caregiver demonstrate ability/potential ability & willingness to safely use the mobility device?   [x]  Yes []  No Does patient's home environment support use of recommended mobility device?                                                    [  x] Yes []  No Does patient have sufficient upper extremity function necessary to functionally propel a manual wheelchair?    []  Yes  [x]  No Does patient have sufficient strength and trunk stability to safely operate a POV (scooter)?                                  []  Yes [x]  No Does patient need additional features/benefits provided by a power wheelchair for MRADL's in the home?       [x]  Yes []  No Does the patient demonstrate the ability to safely use a power wheelchair?                                                              [x]  Yes []  No  Therapist Name Printed: [name] OTR/L, CBIS Date: 01/23/21  Therapist's Signature:   Date:   Supplier's Name Printed:  Date:   Supplier's Signature:   Date:  Patient/Caregiver Signature:   Date:     This is to certify that I have read this evaluation and do agree with the content within:   5 Name Printed:   18 Signature:  Date:     This is to certify that I, the above signed therapist have the following affiliations: []  This DME provider []  Manufacturer of recommended equipment []  Patient's long term care facility [x]  None of the above                     Plan - 01/23/21 1051     OT Occupational Profile and History Detailed Assessment- Review of Records and additional review of physical, cognitive, psychosocial history related to current functional performance    Occupational performance deficits (Please refer to evaluation for details): ADL's;IADL's;Leisure    Clinical Decision Making Limited treatment options, no task modification necessary    Comorbidities Affecting Occupational Performance: Presence of comorbidities impacting occupational performance    Comorbidities impacting occupational performance description: see medical chart    Modification or Assistance to Complete Evaluation  No modification of tasks or assist necessary to complete eval    OT Frequency One time visit    Plan P: One time visit to assess need for powered wheelchair. Discussed contacting [employer] to assist with proper wheelchair selection and  patient's information was sent via fax.    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   Fatty liver 08/14/2020   Acute ITP ([place]) 05/09/2020   Constipation 05/24/2019   Pancreatic calcification 05/24/2019   Gastroparesis 01/04/2019   N&V (nausea and vomiting) 11/17/2018   Weakness 11/17/2018   Special screening for malignant neoplasms, colon 01/26/2018   Gastroesophageal reflux disease without esophagitis 01/26/2018   Barrett's esophagus without dysplasia 01/26/2018   Thrombocytopenia ([place]) 04/02/2017   Lumbar stenosis with neurogenic claudication 11/03/2016   Intractable vomiting with nausea 05/29/2016   OSA (obstructive sleep apnea) 11/18/2013   Liver lesion 03/01/2013   Barrett's esophagus 03/01/2013  GERD (gastroesophageal reflux disease) 03/01/2013   Essential hypertension, benign 03/01/2013   S/P lumbar fusion 03/25/2012   Back pain without radiation 03/25/2012    [name], OTR/L,CBIS  [phone]  01/23/2021, 10:54 AM  [facility] [address] [place], [place], [address] Phone: [phone]   Fax:  [phone]  Name: TADHG ESKEW MRN: [id number] Date of Birth: [birth year]

## 2021-03-21 NOTE — Addendum Note (Signed)
Addended by: Ailene Ravel D on: 03/21/2021 11:08 AM   Modules accepted: Orders

## 2021-05-05 ENCOUNTER — Other Ambulatory Visit: Payer: Self-pay

## 2021-05-05 ENCOUNTER — Encounter (HOSPITAL_COMMUNITY): Payer: Self-pay

## 2021-05-05 ENCOUNTER — Emergency Department (HOSPITAL_COMMUNITY): Payer: Medicare Other

## 2021-05-05 ENCOUNTER — Observation Stay (HOSPITAL_COMMUNITY)
Admission: EM | Admit: 2021-05-05 | Discharge: 2021-05-06 | Disposition: A | Discharge status: Home / Self Care | Payer: Medicare Other | Attending: Internal Medicine | Admitting: Internal Medicine

## 2021-05-05 DIAGNOSIS — R079 Chest pain, unspecified: Secondary | ICD-10-CM | POA: Diagnosis present

## 2021-05-05 DIAGNOSIS — Z20822 Contact with and (suspected) exposure to covid-19: Secondary | ICD-10-CM | POA: Diagnosis not present

## 2021-05-05 DIAGNOSIS — Z79899 Other long term (current) drug therapy: Secondary | ICD-10-CM | POA: Insufficient documentation

## 2021-05-05 DIAGNOSIS — I10 Essential (primary) hypertension: Secondary | ICD-10-CM | POA: Insufficient documentation

## 2021-05-05 DIAGNOSIS — Z87891 Personal history of nicotine dependence: Secondary | ICD-10-CM | POA: Diagnosis not present

## 2021-05-05 DIAGNOSIS — I2694 Multiple subsegmental pulmonary emboli without acute cor pulmonale: Secondary | ICD-10-CM | POA: Diagnosis not present

## 2021-05-05 DIAGNOSIS — I82409 Acute embolism and thrombosis of unspecified deep veins of unspecified lower extremity: Secondary | ICD-10-CM

## 2021-05-05 DIAGNOSIS — I2699 Other pulmonary embolism without acute cor pulmonale: Principal | ICD-10-CM | POA: Diagnosis present

## 2021-05-05 DIAGNOSIS — I2510 Atherosclerotic heart disease of native coronary artery without angina pectoris: Secondary | ICD-10-CM | POA: Diagnosis not present

## 2021-05-05 DIAGNOSIS — I255 Ischemic cardiomyopathy: Secondary | ICD-10-CM

## 2021-05-05 LAB — BASIC METABOLIC PANEL
Anion gap: 8 (ref 5–15)
BUN: 21 mg/dL (ref 8–23)
CO2: 29 mmol/L (ref 22–32)
Calcium: 9.3 mg/dL (ref 8.9–10.3)
Chloride: 104 mmol/L (ref 98–111)
Creatinine, Ser: 1.48 mg/dL — ABNORMAL HIGH (ref 0.61–1.24)
GFR, Estimated: 51 mL/min — ABNORMAL LOW (ref >60)
Glucose, Bld: 94 mg/dL (ref 70–99)
Potassium: 4.1 mmol/L (ref 3.5–5.1)
Sodium: 141 mmol/L (ref 135–145)

## 2021-05-05 LAB — CBC
HCT: 45.5 % (ref 39.0–52.0)
HCT: 42.1 % (ref 39.0–52.0)
Hemoglobin: 15.2 g/dL (ref 13.0–17.0)
Hemoglobin: 14 g/dL (ref 13.0–17.0)
MCH: 32.3 pg (ref 26.0–34.0)
MCH: 32.6 pg (ref 26.0–34.0)
MCHC: 33.4 g/dL (ref 30.0–36.0)
MCHC: 33.3 g/dL (ref 30.0–36.0)
MCV: 96.6 fL (ref 80.0–100.0)
MCV: 97.9 fL (ref 80.0–100.0)
Platelets: 71 10*3/uL — ABNORMAL LOW (ref 150–400)
Platelets: 67 10*3/uL — ABNORMAL LOW (ref 150–400)
RBC: 4.71 MIL/uL (ref 4.22–5.81)
RBC: 4.3 MIL/uL (ref 4.22–5.81)
RDW: 13 % (ref 11.5–15.5)
RDW: 13 % (ref 11.5–15.5)
WBC: 5.9 10*3/uL (ref 4.0–10.5)
WBC: 5.1 10*3/uL (ref 4.0–10.5)
nRBC: 0 % (ref 0.0–0.2)
nRBC: 0 % (ref 0.0–0.2)

## 2021-05-05 LAB — RESP PANEL BY RT-PCR (FLU A&B, COVID) ARPGX2
Influenza A by PCR: NEGATIVE
Influenza B by PCR: NEGATIVE
SARS Coronavirus 2 by RT PCR: NEGATIVE

## 2021-05-05 LAB — PROTIME-INR
INR: 1.1 (ref 0.8–1.2)
Prothrombin Time: 13.9 seconds (ref 11.4–15.2)

## 2021-05-05 LAB — TROPONIN I (HIGH SENSITIVITY)
Troponin I (High Sensitivity): 4 ng/L (ref <18)
Troponin I (High Sensitivity): 4 ng/L (ref <18)

## 2021-05-05 LAB — HEPARIN LEVEL (UNFRACTIONATED): Heparin Unfractionated: 0.79 IU/mL — ABNORMAL HIGH (ref 0.30–0.70)

## 2021-05-05 LAB — LIPASE, BLOOD: Lipase: 32 U/L (ref 11–51)

## 2021-05-05 LAB — MAGNESIUM: Magnesium: 2.1 mg/dL (ref 1.7–2.4)

## 2021-05-05 LAB — CBG MONITORING, ED: Glucose-Capillary: 80 mg/dL (ref 70–99)

## 2021-05-05 MED ORDER — ESTER-C 500-550 MG PO TABS: 1.0000 | ORAL_TABLET | Freq: Every day | ORAL | Status: DC

## 2021-05-05 MED ORDER — ACETAMINOPHEN 650 MG RE SUPP: 650.0000 mg | Freq: Four times a day (QID) | RECTAL | Status: DC | PRN

## 2021-05-05 MED ORDER — SODIUM CHLORIDE 0.9% FLUSH
3.0000 mL | Freq: Two times a day (BID) | INTRAVENOUS | Status: DC
  Administered 2021-05-05 – 2021-05-06 (×3): 3 mL via INTRAVENOUS

## 2021-05-05 MED ORDER — CALCIUM CARBONATE-VITAMIN D 500-200 MG-UNIT PO TABS: 1.0000 | ORAL_TABLET | Freq: Two times a day (BID) | ORAL | Status: DC

## 2021-05-05 MED ORDER — OMEGA-3 FATTY ACIDS 1000 MG PO CAPS: 1.0000 g | ORAL_CAPSULE | Freq: Two times a day (BID) | ORAL | Status: DC

## 2021-05-05 MED ORDER — METOCLOPRAMIDE HCL 10 MG PO TABS
10.0000 mg | ORAL_TABLET | Freq: Two times a day (BID) | ORAL | Status: DC
  Administered 2021-05-05 – 2021-05-06 (×2): 10 mg via ORAL
  Filled 2021-05-05 (×3): qty 1

## 2021-05-05 MED ORDER — HEPARIN BOLUS VIA INFUSION
6000.0000 [IU] | Freq: Once | INTRAVENOUS | Status: AC
  Administered 2021-05-05: 15:00:00 6000 [IU] via INTRAVENOUS

## 2021-05-05 MED ORDER — RANOLAZINE ER 500 MG PO TB12
500.0000 mg | ORAL_TABLET | Freq: Two times a day (BID) | ORAL | Status: DC
  Administered 2021-05-05 – 2021-05-06 (×2): 500 mg via ORAL
  Filled 2021-05-05 (×2): qty 1

## 2021-05-05 MED ORDER — IOHEXOL 350 MG/ML SOLN
75.0000 mL | Freq: Once | INTRAVENOUS | Status: AC | PRN
  Administered 2021-05-05: 12:00:00 75 mL via INTRAVENOUS

## 2021-05-05 MED ORDER — HYDROCORTISONE SOD SUC (PF) 250 MG IJ SOLR: 200.0000 mg | Freq: Once | INTRAMUSCULAR | Status: DC

## 2021-05-05 MED ORDER — HYDROCORTISONE 10 MG PO TABS
10.0000 mg | ORAL_TABLET | Freq: Every evening | ORAL | Status: DC
  Administered 2021-05-05: 18:00:00 10 mg via ORAL
  Filled 2021-05-05 (×3): qty 1

## 2021-05-05 MED ORDER — ACETAMINOPHEN 325 MG PO TABS: 650.0000 mg | ORAL_TABLET | Freq: Four times a day (QID) | ORAL | Status: DC | PRN

## 2021-05-05 MED ORDER — ROSUVASTATIN CALCIUM 10 MG PO TABS
10.0000 mg | ORAL_TABLET | Freq: Every evening | ORAL | Status: DC
  Administered 2021-05-05 – 2021-05-06 (×2): 10 mg via ORAL
  Filled 2021-05-05 (×2): qty 1

## 2021-05-05 MED ORDER — HYDROCORTISONE SOD SUC (PF) 100 MG IJ SOLR: 200.0000 mg | Freq: Once | INTRAMUSCULAR | Status: DC

## 2021-05-05 MED ORDER — CYCLOBENZAPRINE HCL 10 MG PO TABS: 10.0000 mg | ORAL_TABLET | Freq: Three times a day (TID) | ORAL | Status: DC | PRN

## 2021-05-05 MED ORDER — SODIUM CHLORIDE 0.9 % IV SOLN: 250.0000 mL | INTRAVENOUS | Status: DC | PRN

## 2021-05-05 MED ORDER — OXYCODONE-ACETAMINOPHEN 10-325 MG PO TABS: 1.0000 | ORAL_TABLET | Freq: Four times a day (QID) | ORAL | Status: DC | PRN

## 2021-05-05 MED ORDER — ONDANSETRON HCL 4 MG PO TABS: 4.0000 mg | ORAL_TABLET | Freq: Four times a day (QID) | ORAL | Status: DC | PRN

## 2021-05-05 MED ORDER — ONDANSETRON HCL 4 MG/2ML IJ SOLN
4.0000 mg | Freq: Once | INTRAMUSCULAR | Status: AC
  Administered 2021-05-05: 12:00:00 4 mg via INTRAVENOUS
  Filled 2021-05-05: qty 2

## 2021-05-05 MED ORDER — DIPHENHYDRAMINE HCL 50 MG/ML IJ SOLN: 50.0000 mg | Freq: Once | INTRAMUSCULAR | Status: DC

## 2021-05-05 MED ORDER — ADULT MULTIVITAMIN W/MINERALS CH
1.0000 | ORAL_TABLET | Freq: Every day | ORAL | Status: DC
  Administered 2021-05-06: 1 via ORAL
  Filled 2021-05-05: qty 1

## 2021-05-05 MED ORDER — HYDROCORTISONE 20 MG PO TABS
ORAL_TABLET | ORAL | Status: AC
  Filled 2021-05-05: qty 1

## 2021-05-05 MED ORDER — OYSTER SHELL CALCIUM/D3 500-5 MG-MCG PO TABS
1.0000 | ORAL_TABLET | Freq: Two times a day (BID) | ORAL | Status: DC
Start: 2021-05-05 — End: 2021-05-06
  Administered 2021-05-05 – 2021-05-06 (×2): 1 via ORAL
  Filled 2021-05-05 (×3): qty 1

## 2021-05-05 MED ORDER — OMEGA-3-ACID ETHYL ESTERS 1 G PO CAPS
1.0000 g | ORAL_CAPSULE | Freq: Two times a day (BID) | ORAL | Status: DC
  Administered 2021-05-05 – 2021-05-06 (×2): 1 g via ORAL
  Filled 2021-05-05 (×3): qty 1

## 2021-05-05 MED ORDER — SODIUM CHLORIDE 0.9% FLUSH: 3.0000 mL | INTRAVENOUS | Status: DC | PRN

## 2021-05-05 MED ORDER — DIPHENHYDRAMINE HCL 25 MG PO CAPS: 50.0000 mg | ORAL_CAPSULE | Freq: Once | ORAL | Status: DC

## 2021-05-05 MED ORDER — HEPARIN (PORCINE) 25000 UT/250ML-% IV SOLN
1450.0000 [IU]/h | INTRAVENOUS | Status: DC
  Administered 2021-05-05 – 2021-05-06 (×2): 1650 [IU]/h via INTRAVENOUS
  Filled 2021-05-05 (×2): qty 250

## 2021-05-05 MED ORDER — MONTELUKAST SODIUM 10 MG PO TABS
10.0000 mg | ORAL_TABLET | Freq: Every day | ORAL | Status: DC
  Administered 2021-05-06: 10 mg via ORAL
  Filled 2021-05-05: qty 1

## 2021-05-05 MED ORDER — HYDROCORTISONE 10 MG PO TABS
10.0000 mg | ORAL_TABLET | ORAL | Status: DC
Start: 2021-05-05 — End: 2021-05-05

## 2021-05-05 MED ORDER — OXYCODONE-ACETAMINOPHEN 5-325 MG PO TABS: 2.0000 | ORAL_TABLET | Freq: Four times a day (QID) | ORAL | Status: DC | PRN

## 2021-05-05 MED ORDER — DIAZEPAM 5 MG PO TABS: 10.0000 mg | ORAL_TABLET | Freq: Four times a day (QID) | ORAL | Status: DC | PRN

## 2021-05-05 MED ORDER — HYDROCORTISONE 20 MG PO TABS
20.0000 mg | ORAL_TABLET | Freq: Every morning | ORAL | Status: DC
  Administered 2021-05-06: 20 mg via ORAL
  Filled 2021-05-05 (×3): qty 1

## 2021-05-05 MED ORDER — LORATADINE 10 MG PO TABS
10.0000 mg | ORAL_TABLET | Freq: Every day | ORAL | Status: DC
  Administered 2021-05-05 – 2021-05-06 (×2): 10 mg via ORAL
  Filled 2021-05-05 (×2): qty 1

## 2021-05-05 MED ORDER — ONDANSETRON HCL 4 MG/2ML IJ SOLN: 4.0000 mg | Freq: Four times a day (QID) | INTRAMUSCULAR | Status: DC | PRN

## 2021-05-05 NOTE — Progress Notes (Signed)
ANTICOAGULATION CONSULT NOTE - Initial Consult  Pharmacy Consult for Heparin Indication: Pulmonary embolus  Allergies  Allergen Reactions   Demerol Other (See Comments)    "Makes me crazy."   Patient Measurements: Height: 6' (182.9 cm) Weight: 113.4 kg (250 lb) IBW/kg (Calculated) : 77.6 HEPARIN DW (KG): 101.9  Vital Signs: Temp: 98 F (36.7 C) (12/04 1006) Temp Source: Oral (12/04 1006) BP: 121/60 (12/04 1352) Pulse Rate: 56 (12/04 1352)  Labs: Recent Labs    05/05/21 1024 05/05/21 1029 05/05/21 1158  HGB 15.2  --   --   HCT 45.5  --   --   PLT 71*  --   --   LABPROT  --  13.9  --   INR  --  1.1  --   CREATININE 1.48*  --   --   TROPONINIHS 4  --  4    Estimated Creatinine Clearance: 61.2 mL/min (A) (by C-G formula based on SCr of 1.48 mg/dL (H)).   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  Pt not on anticoagulants per PTA med list.  Thrombocytopenia noted which is chronic, will monitor closely.  Pt reportedly has h/o lymphoma and melanoma. Pt is morbidly obese. Pt reports he has not had any recent problems with bleeding complications.  Goal of Therapy:  Heparin level 0.3-0.7 units/ml Monitor platelets by anticoagulation protocol: Yes   Plan:  Heparin bolus 6000 units x  1 Heparin infusion at 1650 units/hr Check HL in 6 hours Monitor PLTs daily and for s/sx bleeding complications  Hart Robinsons A 05/05/2021,2:14 PM

## 2021-05-05 NOTE — H&P (Signed)
History and Physical    [name] HQI:[id number] DOB: 07-21-1950 DOA: 05/05/2021  PCP: Redmond School, MD   Patient coming from: Home  Chief Complaint: Chest pain  HPI: [name] is a 70 y.o. male with medical history significant for lymphoma, thrombocytopenia, massive splenomegaly, GERD, hypertension, gastroparesis, CKD stage IIIa, and CAD/dyslipidemia who presented to the ED with right-sided chest pain that began upon waking up this morning around 5-6 AM.  The pain was described as sharp and nonradiating and is worsened with deep deep recent palpation.  He denies any trauma and states that he was noted to have some nausea yesterday along with some of the pain.  He denies any fevers or chills, cough, vomiting, diaphoresis, or pain radiating down his arm or his neck.  He also denies any shortness of breath.   ED Course: Vital signs stable and patient is afebrile.  Thrombocytopenia of 71,000 noted and creatinine 1.48 which appears to be near his baseline.  Troponins were within normal limits and EKG with no significant findings.  He is currently on room air.  CT chest PE study demonstrates right middle and lower lobe PE with no RV strain noted.  There is some mild associated pulmonary infarct.  He has been started on heparin drip.  Review of Systems: Reviewed as noted above, otherwise negative.  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   Coronary artery disease    mild by 09/08/13 cath HPR   GERD (gastroesophageal reflux disease)    barretts esophagus   H/O hiatal hernia    Headache(784.0)    Hypercholesteremia    Hypertension    PONV (postoperative nausea and vomiting)    trouble breathing after last surgery (at [facility])   Shingles    10   Sleep apnea     not tested   Tumor liver    no problems    Past Surgical History:  Procedure Laterality Date   ABDOMINAL SURGERY     x3   back surgeries     x 9   BACK SURGERY     x8   BIOPSY N/A 03/28/2013   Procedure: Distal Esophageal Biopsy;  Surgeon: Rogene Houston, MD;  Location: [facility];  Service: Endoscopy;  Laterality: N/A;   BIOPSY  05/30/2016   Procedure: BIOPSY;  Surgeon: Rogene Houston, MD;  [facility];  Service: Endoscopy;;   BIOPSY  02/19/2018   Procedure: BIOPSY;  Surgeon: Rogene Houston, MD;  [facility];  Service: Endoscopy;;  Barret's esophagus   BIOPSY  08/29/2020   Procedure: BIOPSY;  Surgeon: Rogene Houston, MD;  [facility];  Service: Endoscopy;;   CARDIAC CATHETERIZATION  2020   CHEST SURGERY     lung incisional hernia for hernia   COLONOSCOPY WITH PROPOFOL N/A 02/19/2018   Procedure: COLONOSCOPY WITH PROPOFOL;  Surgeon: Rogene Houston, MD;  [facility];  Service: Endoscopy;  Laterality: N/A;  1:00   CORONARY ANGIOPLASTY WITH STENT PLACEMENT  2020   2 blockages: 1 stent   ESOPHAGOGASTRODUODENOSCOPY (EGD) WITH PROPOFOL N/A 03/28/2013   Procedure: ESOPHAGOGASTRODUODENOSCOPY (EGD) WITH PROPOFOL;  Surgeon: Bernadene Person  Gloriann Loan, MD;  Location: [facility];  Service: Endoscopy;  Laterality: N/A;  GE junction @ 38, proximal  margin @37    ESOPHAGOGASTRODUODENOSCOPY (EGD) WITH PROPOFOL N/A 05/30/2016   Procedure: ESOPHAGOGASTRODUODENOSCOPY (EGD) WITH PROPOFOL;  Surgeon: Rogene Houston, MD;  [facility];  Service: Endoscopy;  Laterality: N/A;  955   ESOPHAGOGASTRODUODENOSCOPY (EGD) WITH PROPOFOL N/A 02/19/2018   Procedure: ESOPHAGOGASTRODUODENOSCOPY (EGD) WITH PROPOFOL;  Surgeon: Rogene Houston, MD;  [facility];  Service: Endoscopy;  Laterality: N/A;   ESOPHAGOGASTRODUODENOSCOPY (EGD) WITH PROPOFOL N/A 08/29/2020   Procedure: ESOPHAGOGASTRODUODENOSCOPY (EGD) WITH PROPOFOL;  Surgeon: Rogene Houston, MD;   [facility];  Service: Endoscopy;  Laterality: N/A;  AM   HEMORRHOID SURGERY     x2   HERNIA REPAIR     HIATAL HERNIA REPAIR     JOINT REPLACEMENT     lft knee   KNEE ARTHROPLASTY     lft   KNEE ARTHROSCOPY Right    LEG SURGERY     x4 crushed    LITHOTRIPSY     MELANOMA EXCISION     lft abdomen   NECK SURGERY  2012   POLYPECTOMY  02/19/2018   Procedure: POLYPECTOMY;  Surgeon: Rogene Houston, MD;  [facility];  Service: Endoscopy;;  proximal transverse colon (CB x1), descending colon (CBx3)   POSTERIOR CERVICAL FUSION/FORAMINOTOMY  08/18/2011   Procedure: POSTERIOR CERVICAL FUSION/FORAMINOTOMY LEVEL 3;  Surgeon: Ophelia Charter, MD;  Location: [facility];  Service: Neurosurgery;  Laterality: N/A;  Cervical three-four, four-five, five-six posterior cervical  fusion with instrumentation; Right cervical three-four, four-five laminotomy   REVISION TOTAL HIP ARTHROPLASTY Left    SHOULDER ARTHROSCOPY     rt bith defect , spur, rotator cuff   SPINAL CORD STIMULATOR IMPLANT     rt hip   SPINAL CORD STIMULATOR INSERTION N/A 11/03/2016   Procedure: LUMBAR SPINAL CORD STIMULATOR REVISION;  Surgeon: Newman Pies, MD;  Location: [facility];  Service: Neurosurgery;  Laterality: N/A;   THYROID LOBECTOMY     rt     reports that he quit smoking about 40 years ago. His smoking use included cigarettes. He has a 40.00 pack-year smoking history. He has never used smokeless tobacco. He reports that he does not currently use alcohol. He reports that he does not use drugs.  Allergies  Allergen Reactions   Demerol Other (See Comments)    "Makes me crazy."    Family History  Problem Relation Age of Onset   Heart disease Father    Aneurysm Mother    Aneurysm Sister    Heart disease Paternal Grandmother    Heart disease Paternal Grandfather    Heart disease Maternal Grandfather    Heart disease Brother        heart transplant   Aneurysm Other        pt states several  members have aortic aneurysm   Skin cancer Other        several members   Lymphoma Brother    Aneurysm Maternal Grandmother     Prior to Admission medications   Medication Sig Start Date End Date Taking? Authorizing Provider  Aspirin-Acetaminophen-Caffeine (GOODY HEADACHE PO) Take 1-2 packets by mouth daily as needed (headaches).   Yes [provider]  Bioflavonoid Products (ESTER-C) 500-550 MG TABS Take 1 tablet by mouth daily.   Yes [provider]  calcium-vitamin D (OSCAL WITH D) 500-200 MG-UNIT per tablet Take 1 tablet by mouth  2 (two) times daily.    Yes [provider]  cyclobenzaprine (FLEXERIL) 10 MG tablet Take 1 tablet (10 mg total) by mouth 3 (three) times daily as needed for muscle spasms. 11/05/16  Yes Newman Pies, MD  dexlansoprazole (DEXILANT) 60 MG capsule Take 60 mg by mouth daily.    Yes [provider]  diazepam (VALIUM) 10 MG tablet Take 10 mg by mouth 4 (four) times daily as needed (for back pain).    Yes [provider]  fexofenadine (ALLEGRA) 180 MG tablet Take 180 mg by mouth every evening.   Yes [provider]  fish oil-omega-3 fatty acids 1000 MG capsule Take 1 g by mouth 2 (two) times daily.   Yes [provider]  hydrocortisone (CORTEF) 10 MG tablet Take 10 mg by mouth See admin instructions. 10 mg every evening and 20 mg every morning 12/04/20  Yes [provider]  isosorbide mononitrate (IMDUR) 30 MG 24 hr tablet Take 30 mg by mouth 2 (two) times daily. 03/26/20  Yes [provider]  lisinopril (PRINIVIL,ZESTRIL) 10 MG tablet Take 10 mg by mouth daily.   Yes [provider]  metoCLOPramide (REGLAN) 10 MG tablet Take 1 tablet by mouth 2 (two) times daily. 08/03/20  Yes [provider]  montelukast (SINGULAIR) 10 MG tablet Take 10 mg by mouth daily.    Yes [provider]  Multiple Vitamin (MULITIVITAMIN WITH MINERALS) TABS Take 1 tablet by mouth daily.   Yes  [provider]  ondansetron (ZOFRAN) 8 MG tablet Take by mouth. 08/03/20  Yes [provider]  oxyCODONE-acetaminophen (PERCOCET) 10-325 MG tablet Take 1 tablet by mouth every 6 (six) hours as needed for pain.   Yes [provider]  ranolazine (RANEXA) 500 MG 12 hr tablet Take 500 mg by mouth 2 (two) times daily. 04/23/20  Yes [provider]  rosuvastatin (CRESTOR) 10 MG tablet Take 10 mg by mouth every evening.    Yes [provider]  sildenafil (VIAGRA) 100 MG tablet Take 50 mg by mouth as needed for erectile dysfunction. 02/10/20  Yes [provider]    Physical Exam: Vitals:   05/05/21 1315 05/05/21 1352 05/05/21 1400 05/05/21 1430  BP: (!) 96/58 121/60 (!) 107/56 119/65  Pulse: (!) 52 (!) 56 (!) 55 61  Resp: 15 15 16 15   Temp:      TempSrc:      SpO2: 92% 97% 95% 96%  Weight:      Height:        Constitutional: NAD, calm, comfortable Vitals:   05/05/21 1315 05/05/21 1352 05/05/21 1400 05/05/21 1430  BP: (!) 96/58 121/60 (!) 107/56 119/65  Pulse: (!) 52 (!) 56 (!) 55 61  Resp: 15 15 16 15   Temp:      TempSrc:      SpO2: 92% 97% 95% 96%  Weight:      Height:       Eyes: lids and conjunctivae normal Neck: normal, supple Respiratory: clear to auscultation bilaterally. Normal respiratory effort. No accessory muscle use. Currently on 2L [place]. Cardiovascular: Regular rate and rhythm, no murmurs. Abdomen: no tenderness, no distention. Bowel sounds positive.  Musculoskeletal:  No edema. Skin: no rashes, lesions, ulcers.  Psychiatric: Flat affect  Labs on Admission: I have personally reviewed following labs and imaging studies  CBC: Recent Labs  Lab 05/05/21 1024  WBC 5.9  HGB 15.2  HCT 45.5  MCV 96.6  PLT 71*   Basic Metabolic Panel:  Recent Labs  Lab 05/05/21 1024 05/05/21 1029  NA 141  --   K 4.1  --   CL 104  --   CO2 29  --   GLUCOSE 94  --   BUN 21  --   CREATININE 1.48*  --   CALCIUM 9.3  --   MG   --  2.1   GFR: Estimated Creatinine Clearance: 61.2 mL/min (A) (by C-G formula based on SCr of 1.48 mg/dL (H)). Liver Function Tests: No results for input(s): AST, ALT, ALKPHOS, BILITOT, PROT, ALBUMIN in the last 168 hours. Recent Labs  Lab 05/05/21 1024  LIPASE 32   No results for input(s): AMMONIA in the last 168 hours. Coagulation Profile: Recent Labs  Lab 05/05/21 1029  INR 1.1   Cardiac Enzymes: No results for input(s): CKTOTAL, CKMB, CKMBINDEX, TROPONINI in the last 168 hours. BNP (last 3 results) No results for input(s): PROBNP in the last 8760 hours. HbA1C: No results for input(s): HGBA1C in the last 72 hours. CBG: Recent Labs  Lab 05/05/21 1059  GLUCAP 80   Lipid Profile: No results for input(s): CHOL, HDL, LDLCALC, TRIG, CHOLHDL, LDLDIRECT in the last 72 hours. Thyroid Function Tests: No results for input(s): TSH, T4TOTAL, FREET4, T3FREE, THYROIDAB in the last 72 hours. Anemia Panel: No results for input(s): VITAMINB12, FOLATE, FERRITIN, TIBC, IRON, RETICCTPCT in the last 72 hours. Urine analysis:    Component Value Date/Time   COLORURINE YELLOW 05/22/2016 1522   APPEARANCEUR CLEAR 05/22/2016 1522   LABSPEC 1.015 05/22/2016 1522   PHURINE 8.0 05/22/2016 1522   GLUCOSEU NEGATIVE 05/22/2016 1522   HGBUR NEGATIVE 05/22/2016 1522   BILIRUBINUR NEGATIVE 05/22/2016 1522   KETONESUR NEGATIVE 05/22/2016 1522   PROTEINUR NEGATIVE 05/22/2016 1522   UROBILINOGEN 0.2 07/16/2012 1829   NITRITE NEGATIVE 05/22/2016 1522   LEUKOCYTESUR NEGATIVE 05/22/2016 1522    Radiological Exams on Admission: DG Chest 2 View  Result Date: 05/05/2021 CLINICAL DATA:  Right-sided chest pain. EXAM: CHEST - 2 VIEW COMPARISON:  August 25, 2018 FINDINGS: Enlarged cardiac silhouette. Tortuosity and calcific atherosclerotic disease of the aorta. Low lung volumes without evidence of pleural effusion, pneumothorax or consolidation. Mild atelectasis in the bilateral lung bases. Stable  postsurgical changes from thoracic and spinal fusion, and nerve stimulator placement. Osseous structures are without acute abnormality. Soft tissues are grossly normal. IMPRESSION: 1. Enlarged cardiac silhouette. 2. Low lung volumes with mild bibasilar atelectasis. Electronically Signed   By: Fidela Salisbury M.D.   On: 05/05/2021 11:18   CT Angio Chest PE W and/or Wo Contrast  Result Date: 05/05/2021 CLINICAL DATA:  PE suspected.  Right-sided chest pain. EXAM: CT ANGIOGRAPHY CHEST WITH CONTRAST TECHNIQUE: Multidetector CT imaging of the chest was performed using the standard protocol during bolus administration of intravenous contrast. Multiplanar CT image reconstructions and MIPs were obtained to evaluate the vascular anatomy. CONTRAST:  73mL OMNIPAQUE IOHEXOL 350 MG/ML SOLN COMPARISON:  None. FINDINGS: Cardiovascular: Satisfactory opacification of the pulmonary arteries to the segmental level. Segmental pulmonary emboli within the right middle and lower lobe are partially occlusive. Enlarged heart. No evidence of right heart strain. Heavy calcific atherosclerotic disease of the coronary arteries. Mediastinum/Nodes: No enlarged mediastinal, hilar, or axillary lymph nodes. Thyroid gland, and trachea demonstrate no significant findings. Fundoplication. Diffuse dilation of the upstream esophagus. Lungs/Pleura: Peripheral ground-glass opacity in the lateral aspect of the right middle lobe. Minimal right lower lobe atelectasis. Upper Abdomen: No acute abnormality. Musculoskeletal: Prior posterior spinal fusion without evidence of hardware failure. Nerve  stimulator in place. Review of the MIP images confirms the above findings. IMPRESSION: 1. Segmental pulmonary emboli within the right middle and lower lobe are partially occlusive. 2. Peripheral ground-glass opacity in the lateral aspect of the right middle lobe likely represents a small pulmonary infarct. 3. Enlarged heart. 4. Heavy calcific atherosclerotic  disease of the coronary arteries. 5. Diffuse dilation of the upstream esophagus. 6. Fundoplication. 7. Aortic atherosclerosis. Aortic Atherosclerosis (ICD10-I70.0). These results were called by telephone at the time of interpretation on 05/05/2021 at 1:59 pm to provider [facility] TRIPLETT , who verbally acknowledged these results. Electronically Signed   By: Fidela Salisbury M.D.   On: 05/05/2021 14:01    EKG: Independently reviewed. SR 70bpm.  Assessment/Plan Principal Problem:   Acute pulmonary embolism (HCC)    Acute hypoxemic respiratory failure with chest pain secondary to acute right-sided PE with small pulmonary infarct -Currently with mild hypoxemia -Started on heparin drip and will need telemetry monitoring overnight -PESI score of 109 with 4-11% 30-day mortality and therefore high risk -Check 2D echo and bilateral dopplers -Pulmonology evaluation in a.m. -Discussion with oncology in am  Chronic thrombocytopenia -Monitor closely while on heparin drip  CAD/dyslipidemia/htn -Hold blood pressure agents for now and monitor closely with as needed medications given soft blood pressure -Continue statin  History of lymphoma -Follows with Dr. Delton Coombes and Dr. Caprice Beaver at [facility] [phone] -Also has hx of stage III melanoma s/p resection  CKD stage IIIa -Currently stable  GERD -Continue PPI   DVT prophylaxis: Heparin drip Code Status: Full Family Communication: Wife at bedside; daughter on phone Disposition Plan:Admit for PE treatment Consults called:None Admission status: Obs, Tele  Ruben Grose D Manuella Ghazi DO [facility]  If 7PM-7AM, please contact night-coverage [email or web address]  05/05/2021, 2:35 PM

## 2021-05-05 NOTE — ED Triage Notes (Signed)
Reports right sided chest pain that started this am.  Denies pain radiating anywhere.  Was nauseated yesterday.  Today took alkaseltzer and flexeril.  Reports pain is sharp.  Denies n.v sob at this time. Hx of fatty liver and lymphoma per patient.

## 2021-05-05 NOTE — Progress Notes (Addendum)
[place] for Heparin Indication: Pulmonary embolus  Allergies  Allergen Reactions   Demerol Other (See Comments)    "Makes me crazy."   Patient Measurements: Height: 6' (182.9 cm) Weight: 113.4 kg (250 lb) IBW/kg (Calculated) : 77.6 HEPARIN DW (KG): 101.9  Vital Signs: Temp: 98.6 F (37 C) (12/04 2324) Temp Source: Oral (12/04 2324) BP: 88/62 (12/04 2324) Pulse Rate: 51 (12/04 2324)  Labs: Recent Labs    05/05/21 1024 05/05/21 1029 05/05/21 1158 05/05/21 2115  HGB 15.2  --   --  14.0  HCT 45.5  --   --  42.1  PLT 71*  --   --  67*  LABPROT  --  13.9  --   --   INR  --  1.1  --   --   HEPARINUNFRC  --   --   --  0.79*  CREATININE 1.48*  --   --   --   TROPONINIHS 4  --  4  --      Estimated Creatinine Clearance: 61.2 mL/min (A) (by C-G formula based on SCr of 1.48 mg/dL (H)).  Assessment: 70 y.o. male with PE for heparin.  Heparin level supatherapeutic, but expect to decrease further after large bolus.  Goal of Therapy:  Heparin level 0.3-0.7 units/ml Monitor platelets by anticoagulation protocol: Yes   Plan:  Continue Heparin at current rate for now Recheck level with AM labs, and adjust rate if needed at that time  Caryl Pina 05/05/2021,11:29 PM  Addendum: Heparin level increased to 0.84 this morning.  Platelets stable Decrease Heparin 1450 units/hr Check heparin level in 8 hours.  Phillis Knack, PharmD, BCPS  05/06/2021 7:10 AM

## 2021-05-05 NOTE — ED Provider Notes (Signed)
[facility] Provider Note   CSN: [id number] Arrival date & time: 05/05/21  9833     History Chief Complaint  Patient presents with   Chest Pain    EVART MCDONNELL is a 70 y.o. male.   Chest Pain Associated symptoms: no abdominal pain, no back pain, no cough, no dizziness, no dysphagia, no fatigue, no fever, no nausea, no numbness, no palpitations, no shortness of breath, no vomiting and no weakness       BAINE DECESARE is a 70 y.o. male with past medical history of asending aortic aneurysm, lymphoma, GERD, hypertension and thrombocytopenia who presents to the Emergency Department complaining of right-sided chest pain upon waking this morning.  Pain began somewhere between 5 and 6 AM this morning.  Patient describes pain as sharp and nonradiating.  Pain is associated with deep breathing and with palpation.  No known injury.  His pain has been associated with some nausea yesterday.  He denies any recent illness, fever, cough, nausea vomiting, diaphoresis, arm pain and shortness of breath.  PCP Dr. Hilma Favors Cardiologist Dr. Edythe Lynn in [facility] Oncologist Dr. 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   Coronary artery disease    mild by 09/08/13 cath [facility]   GERD (gastroesophageal reflux disease)    barretts esophagus   H/O hiatal hernia    Headache(784.0)    Hypercholesteremia    Hypertension    PONV (postoperative nausea and vomiting)    trouble breathing after last surgery (at [facility])   Shingles    10   Sleep apnea    not tested   Tumor liver    no problems    Patient Active Problem List   Diagnosis Date Noted   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   Fatty liver 08/14/2020   Acute ITP ([place])  05/09/2020   Constipation 05/24/2019   Pancreatic calcification 05/24/2019   Gastroparesis 01/04/2019   N&V (nausea and vomiting) 11/17/2018   Weakness 11/17/2018   Special screening for malignant neoplasms, colon 01/26/2018   Gastroesophageal reflux disease without esophagitis 01/26/2018   Barrett's esophagus without dysplasia 01/26/2018   Thrombocytopenia ([place]) 04/02/2017   Lumbar stenosis with neurogenic claudication 11/03/2016   Intractable vomiting with nausea 05/29/2016   OSA (obstructive sleep apnea) 11/18/2013   Liver lesion 03/01/2013   Barrett's esophagus 03/01/2013   GERD (gastroesophageal reflux disease) 03/01/2013   Essential hypertension, benign 03/01/2013   S/P lumbar fusion 03/25/2012   Back pain without radiation 03/25/2012    Past Surgical History:  Procedure Laterality Date   ABDOMINAL SURGERY     x3   back surgeries     x 9   BACK SURGERY     x8   BIOPSY N/A 03/28/2013   Procedure: Distal Esophageal Biopsy;  Surgeon: Rogene Houston, MD;  Location: [facility];  Service: Endoscopy;  Laterality: N/A;   BIOPSY  05/30/2016   Procedure: BIOPSY;  Surgeon: Rogene Houston, MD;  Location[facility];  Service: Endoscopy;;   BIOPSY  02/19/2018   Procedure: BIOPSY;  Surgeon: Rogene Houston, MD;  [facility];  Service: Endoscopy;;  Barret's esophagus   BIOPSY  08/29/2020   Procedure: BIOPSY;  Surgeon: Rogene Houston, MD;  [facility];  Service: Endoscopy;;   CARDIAC CATHETERIZATION  2020   CHEST SURGERY     lung incisional hernia for hernia   COLONOSCOPY WITH PROPOFOL N/A 02/19/2018   Procedure: COLONOSCOPY WITH PROPOFOL;  Surgeon: Rogene Houston, MD;  [facility];  Service: Endoscopy;  Laterality: N/A;  1:00   CORONARY ANGIOPLASTY WITH STENT PLACEMENT  2020   2 blockages: 1 stent   ESOPHAGOGASTRODUODENOSCOPY (EGD) WITH PROPOFOL N/A 03/28/2013   Procedure: ESOPHAGOGASTRODUODENOSCOPY (EGD) WITH PROPOFOL;  Surgeon: Rogene Houston, MD;  Location: [facility];  Service: Endoscopy;  Laterality: N/A;  GE junction @ 38, proximal  margin @37    ESOPHAGOGASTRODUODENOSCOPY (EGD) WITH PROPOFOL N/A 05/30/2016   Procedure: ESOPHAGOGASTRODUODENOSCOPY (EGD) WITH PROPOFOL;  Surgeon: Rogene Houston, MD;  [facility];  Service: Endoscopy;  Laterality: N/A;  955   ESOPHAGOGASTRODUODENOSCOPY (EGD) WITH PROPOFOL N/A 02/19/2018   Procedure: ESOPHAGOGASTRODUODENOSCOPY (EGD) WITH PROPOFOL;  Surgeon: Rogene Houston, MD;  [facility];  Service: Endoscopy;  Laterality: N/A;   ESOPHAGOGASTRODUODENOSCOPY (EGD) WITH PROPOFOL N/A 08/29/2020   Procedure: ESOPHAGOGASTRODUODENOSCOPY (EGD) WITH PROPOFOL;  Surgeon: Rogene Houston, MD;  [facility];  Service: Endoscopy;  Laterality: N/A;  AM   HEMORRHOID SURGERY     x2   HERNIA REPAIR     HIATAL HERNIA REPAIR     JOINT REPLACEMENT     lft knee   KNEE ARTHROPLASTY     lft   KNEE ARTHROSCOPY Right    LEG SURGERY     x4 crushed    LITHOTRIPSY     MELANOMA EXCISION     lft abdomen   NECK SURGERY  2012   POLYPECTOMY  02/19/2018   Procedure: POLYPECTOMY;  Surgeon: Rogene Houston, MD;  [facility];  Service: Endoscopy;;  proximal transverse colon (CB x1), descending colon (CBx3)   POSTERIOR CERVICAL FUSION/FORAMINOTOMY  08/18/2011   Procedure: POSTERIOR CERVICAL FUSION/FORAMINOTOMY LEVEL 3;  Surgeon: Ophelia Charter, MD;  [facility];  Service: Neurosurgery;  Laterality: N/A;  Cervical three-four, four-five, five-six posterior cervical  fusion with instrumentation; Right cervical three-four, four-five laminotomy   REVISION TOTAL HIP ARTHROPLASTY Left    SHOULDER ARTHROSCOPY     rt bith defect , spur, rotator cuff   SPINAL CORD STIMULATOR IMPLANT     rt hip   SPINAL CORD STIMULATOR INSERTION N/A 11/03/2016   Procedure: LUMBAR SPINAL CORD STIMULATOR REVISION;  Surgeon: Newman Pies, MD;  Location: [place];  Service: Neurosurgery;   Laterality: N/A;   THYROID LOBECTOMY     rt       Family History  Problem Relation Age of Onset   Heart disease Father    Aneurysm Mother    Aneurysm Sister    Heart disease Paternal Grandmother    Heart disease Paternal Grandfather    Heart disease Maternal Grandfather    Heart disease Brother        heart transplant   Aneurysm Other        pt states several members have aortic aneurysm   Skin cancer Other        several members   Lymphoma Brother    Aneurysm Maternal Grandmother     Social History   Tobacco Use   Smoking status: Former    Packs/day: 4.00    Years: 10.00  Pack years: 40.00    Types: Cigarettes    Quit date: 08/12/1980    Years since quitting: 40.7   Smokeless tobacco: Never   Tobacco comments:    occ wine  Vaping Use   Vaping Use: Never used  Substance Use Topics   Alcohol use: Not Currently    Comment: rarely   Drug use: No    Home Medications Prior to Admission medications   Medication Sig Start Date End Date Taking? Authorizing Provider  Aspirin-Acetaminophen-Caffeine (GOODY HEADACHE PO) Take 1-2 packets by mouth daily as needed (headaches). Patient not taking: No sig reported    [provider]  Bioflavonoid Products (ESTER-C) 500-550 MG TABS Take 1 tablet by mouth daily.    [provider]  calcium-vitamin D (OSCAL WITH D) 500-200 MG-UNIT per tablet Take 1 tablet by mouth 2 (two) times daily.     [provider]  cyclobenzaprine (FLEXERIL) 10 MG tablet Take 1 tablet (10 mg total) by mouth 3 (three) times daily as needed for muscle spasms. Patient not taking: No sig reported 11/05/16   Newman Pies, MD  dexlansoprazole (DEXILANT) 60 MG capsule Take 60 mg by mouth daily.     [provider]  diazepam (VALIUM) 10 MG tablet Take 10 mg by mouth 4 (four) times daily as needed (for back pain).     [provider]  fexofenadine (ALLEGRA) 180 MG tablet Take 180 mg by mouth every evening.     [provider]  fish oil-omega-3 fatty acids 1000 MG capsule Take 1 g by mouth 2 (two) times daily.    [provider]  hydrocortisone (CORTEF) 10 MG tablet Take by mouth. 12/04/20   [provider]  isosorbide mononitrate (IMDUR) 30 MG 24 hr tablet Take 30 mg by mouth daily. 03/26/20   [provider]  lisinopril (PRINIVIL,ZESTRIL) 10 MG tablet Take 10 mg by mouth daily.    [provider]  montelukast (SINGULAIR) 10 MG tablet Take 10 mg by mouth daily.     [provider]  Multiple Vitamin (MULITIVITAMIN WITH MINERALS) TABS Take 1 tablet by mouth daily.    [provider]  ondansetron (ZOFRAN) 8 MG tablet Take by mouth. 08/03/20   [provider]  oxyCODONE-acetaminophen (PERCOCET) 10-325 MG tablet Take 1 tablet by mouth every 6 (six) hours as needed for pain. Patient not taking: No sig reported    [provider]  ranolazine (RANEXA) 500 MG 12 hr tablet Take 500 mg by mouth 2 (two) times daily. 04/23/20   [provider]  rosuvastatin (CRESTOR) 10 MG tablet Take 10 mg by mouth every evening.     [provider]  sildenafil (VIAGRA) 100 MG tablet Take 50 mg by mouth as needed for erectile dysfunction. 02/10/20   [provider]    Allergies    Demerol and Contrast media [iodinated diagnostic agents]  Review of Systems   Review of Systems  Constitutional:  Negative for chills, fatigue and fever.  HENT:  Negative for sore throat and trouble swallowing.   Respiratory:  Negative for cough, shortness of breath and wheezing.   Cardiovascular:  Positive for chest pain. Negative for palpitations.  Gastrointestinal:  Negative for abdominal pain, blood in stool, nausea and vomiting.  Genitourinary:  Negative for dysuria, flank pain and hematuria.  Musculoskeletal:  Negative for arthralgias, back pain, myalgias, neck pain and neck stiffness.  Skin:  Negative for rash.  Neurological:  Negative  for dizziness, weakness and numbness.  Hematological:  Does not bruise/bleed easily.  All other systems reviewed and are negative.  Physical Exam Updated Vital Signs BP 131/83 (BP Location: Left Arm)   Pulse 73   Temp 98 F (36.7 C) (Oral)   Resp 15   Ht 6' (1.829 m)   Wt 113.4 kg   SpO2 93%   BMI 33.91 kg/m   Physical Exam Vitals and nursing note reviewed.  Constitutional:      General: He is not in acute distress.    Appearance: Normal appearance. He is not ill-appearing.  HENT:     Head: Normocephalic.     Mouth/Throat:     Mouth: Mucous membranes are moist.  Eyes:     Pupils: Pupils are equal, round, and reactive to light.  Neck:     Thyroid: No thyromegaly.     Meningeal: Kernig's sign absent.  Cardiovascular:     Rate and Rhythm: Normal rate and regular rhythm.     Pulses: Normal pulses.  Pulmonary:     Effort: Pulmonary effort is normal.     Breath sounds: Normal breath sounds. No wheezing.     Comments: Tenderness palpation of the right lateral chest.  No rash or crepitus. Chest:     Chest wall: Tenderness present.  Abdominal:     Palpations: Abdomen is soft.     Tenderness: There is no abdominal tenderness. There is no guarding or rebound.  Musculoskeletal:        General: Normal range of motion.     Cervical back: Normal range of motion and neck supple.     Right lower leg: No edema.     Left lower leg: No edema.  Skin:    General: Skin is warm.     Capillary Refill: Capillary refill takes less than 2 seconds.     Findings: No rash.  Neurological:     General: No focal deficit present.     Mental Status: He is alert.     Sensory: No sensory deficit.     Motor: No weakness.    ED Results / Procedures / Treatments   Labs (all labs ordered are listed, but only abnormal results are displayed) Labs Reviewed  BASIC METABOLIC PANEL - Abnormal; Notable for the following components:      Result Value   Creatinine, Ser 1.48 (*)    GFR, Estimated 51  (*)    All other components within normal limits  CBC - Abnormal; Notable for the following components:   Platelets 71 (*)    All other components within normal limits  RESP PANEL BY RT-PCR (FLU A&B, COVID) ARPGX2  LIPASE, BLOOD  MAGNESIUM  PROTIME-INR  HEPARIN LEVEL (UNFRACTIONATED)  CBC  CBG MONITORING, ED  TROPONIN I (HIGH SENSITIVITY)  TROPONIN I (HIGH SENSITIVITY)    EKG EKG Interpretation  Date/Time:  Sunday May 05 2021 10:03:11 EST Ventricular Rate:  70 PR Interval:  209 QRS Duration: 103 QT Interval:  372 QTC Calculation: 402 R Axis:   -51 Text Interpretation: Sinus rhythm Multiform ventricular premature complexes Left anterior fascicular block RSR' in V1 or V2, right VCD or RVH Confirmed by Fredia Sorrow [phone]) on 05/05/2021 10:17:34 AM  Radiology DG Chest 2 View  Result Date: 05/05/2021 CLINICAL DATA:  Right-sided chest pain. EXAM: CHEST - 2 VIEW COMPARISON:  August 25, 2018 FINDINGS: Enlarged cardiac silhouette. Tortuosity and calcific atherosclerotic disease of the aorta. Low lung volumes without evidence of pleural effusion, pneumothorax or consolidation. Mild atelectasis in the bilateral  lung bases. Stable postsurgical changes from thoracic and spinal fusion, and nerve stimulator placement. Osseous structures are without acute abnormality. Soft tissues are grossly normal. IMPRESSION: 1. Enlarged cardiac silhouette. 2. Low lung volumes with mild bibasilar atelectasis. Electronically Signed   By: Fidela Salisbury M.D.   On: 05/05/2021 11:18   CT Angio Chest PE W and/or Wo Contrast  Result Date: 05/05/2021 CLINICAL DATA:  PE suspected.  Right-sided chest pain. EXAM: CT ANGIOGRAPHY CHEST WITH CONTRAST TECHNIQUE: Multidetector CT imaging of the chest was performed using the standard protocol during bolus administration of intravenous contrast. Multiplanar CT image reconstructions and MIPs were obtained to evaluate the vascular anatomy. CONTRAST:  66mL OMNIPAQUE  IOHEXOL 350 MG/ML SOLN COMPARISON:  None. FINDINGS: Cardiovascular: Satisfactory opacification of the pulmonary arteries to the segmental level. Segmental pulmonary emboli within the right middle and lower lobe are partially occlusive. Enlarged heart. No evidence of right heart strain. Heavy calcific atherosclerotic disease of the coronary arteries. Mediastinum/Nodes: No enlarged mediastinal, hilar, or axillary lymph nodes. Thyroid gland, and trachea demonstrate no significant findings. Fundoplication. Diffuse dilation of the upstream esophagus. Lungs/Pleura: Peripheral ground-glass opacity in the lateral aspect of the right middle lobe. Minimal right lower lobe atelectasis. Upper Abdomen: No acute abnormality. Musculoskeletal: Prior posterior spinal fusion without evidence of hardware failure. Nerve stimulator in place. Review of the MIP images confirms the above findings. IMPRESSION: 1. Segmental pulmonary emboli within the right middle and lower lobe are partially occlusive. 2. Peripheral ground-glass opacity in the lateral aspect of the right middle lobe likely represents a small pulmonary infarct. 3. Enlarged heart. 4. Heavy calcific atherosclerotic disease of the coronary arteries. 5. Diffuse dilation of the upstream esophagus. 6. Fundoplication. 7. Aortic atherosclerosis. Aortic Atherosclerosis (ICD10-I70.0). These results were called by telephone at the time of interpretation on 05/05/2021 at 1:59 pm to provider [facility] Tatym Schermer , who verbally acknowledged these results. Electronically Signed   By: Fidela Salisbury M.D.   On: 05/05/2021 14:01    Procedures Procedures   Medications Ordered in ED Medications - No data to display  ED Course  I have reviewed the triage vital signs and the nursing notes.  Pertinent labs & imaging results that were available during my care of the patient were reviewed by me and considered in my medical decision making (see chart for details).   CRITICAL  CARE Performed by: Ellias Mcelreath Total critical care time: 40 minutes Critical care time was exclusive of separately billable procedures and treating other patients. Critical care was necessary to treat or prevent imminent or life-threatening deterioration. Critical care was time spent personally by me on the following activities: development of treatment plan with patient and/or surrogate as well as nursing, discussions with consultants, evaluation of patient's response to treatment, examination of patient, obtaining history from patient or surrogate, ordering and performing treatments and interventions, ordering and review of laboratory studies, ordering and review of radiographic studies, pulse oximetry and re-evaluation of patient's condition. care MDM Rules/Calculators/A&P                           Patient here for evaluation of right-sided chest pain upon waking this morning.  Onset at 5:55 AM this morning.  Describes sharp pain to his right chest that is reproducible with deep breathing and palpation.  No dyspnea.  No known injury.  No recent cough fever or other illness.  he does have a history of ascending aortic aneurysm, lymphoma and thrombocytopenia.  On exam, patient well-appearing.  No tachycardia tachypnea or hypoxia.  Mild to moderate tenderness with palpation and patient guarding with deep breathing.  Will obtain labs, EKG, chest x-ray and proceed with CT angio of the chest.  Labs interpreted by me, no leukocytosis, creatinine near baseline.  Magnesium level unremarkable.  Troponin within normal limits.  EKG without acute ischemic change.  Patient hemodynamically stable.  No hypoxia.  Results of CT angio of the chest called in to me by radiologist.  Patient has PEs of the right middle and lower lobe with small pulmonary infarct.  No right heart strain. Will consult pharmacy for heparin dosing.  Patient critically ill, will need hospital admission.  Discussed findings with [facility]  hospitalist, Dr. Manuella Ghazi who agrees to admit.  Final Clinical Impression(s) / ED Diagnoses Final diagnoses:  Multiple subsegmental pulmonary emboli without acute cor pulmonale ([place])  Pulmonary infarct [facility]    Rx / DC Orders ED Discharge Orders     None        Kem Parkinson, PA-C 05/05/21 1508    Fredia Sorrow, MD 05/09/21 [phone]

## 2021-05-05 NOTE — ED Notes (Signed)
Placed on 2L [place] due to oxygen level 86% RA

## 2021-05-06 ENCOUNTER — Observation Stay (HOSPITAL_COMMUNITY): Payer: Medicare Other

## 2021-05-06 ENCOUNTER — Telehealth: Payer: Self-pay

## 2021-05-06 ENCOUNTER — Encounter (HOSPITAL_COMMUNITY): Payer: Self-pay | Admitting: Hematology and Oncology

## 2021-05-06 ENCOUNTER — Observation Stay (HOSPITAL_BASED_OUTPATIENT_CLINIC_OR_DEPARTMENT_OTHER): Payer: Medicare Other

## 2021-05-06 ENCOUNTER — Other Ambulatory Visit (HOSPITAL_COMMUNITY): Payer: Self-pay

## 2021-05-06 DIAGNOSIS — I2699 Other pulmonary embolism without acute cor pulmonale: Secondary | ICD-10-CM

## 2021-05-06 DIAGNOSIS — R079 Chest pain, unspecified: Secondary | ICD-10-CM

## 2021-05-06 LAB — ECHOCARDIOGRAM COMPLETE
AR max vel: 2.42 cm2
AV Area VTI: 2.7 cm2
AV Area mean vel: 2.29 cm2
AV Mean grad: 3 mmHg
AV Peak grad: 6.9 mmHg
Ao pk vel: 1.31 m/s
Area-P 1/2: 3.37 cm2
Calc EF: 44.9 %
Height: 72 in
MV VTI: 2.75 cm2
P 1/2 time: 784 msec
S' Lateral: 3.7 cm
Single Plane A2C EF: 42.8 %
Single Plane A4C EF: 50.6 %
Weight: 4000 oz

## 2021-05-06 LAB — CBC
HCT: 41 % (ref 39.0–52.0)
Hemoglobin: 13.9 g/dL (ref 13.0–17.0)
MCH: 32.6 pg (ref 26.0–34.0)
MCHC: 33.9 g/dL (ref 30.0–36.0)
MCV: 96.2 fL (ref 80.0–100.0)
Platelets: 67 10*3/uL — ABNORMAL LOW (ref 150–400)
RBC: 4.26 MIL/uL (ref 4.22–5.81)
RDW: 13.1 % (ref 11.5–15.5)
WBC: 4.8 10*3/uL (ref 4.0–10.5)
nRBC: 0 % (ref 0.0–0.2)

## 2021-05-06 LAB — HEPARIN LEVEL (UNFRACTIONATED)
Heparin Unfractionated: 0.84 IU/mL — ABNORMAL HIGH (ref 0.30–0.70)
Heparin Unfractionated: 0.58 IU/mL (ref 0.30–0.70)

## 2021-05-06 LAB — BASIC METABOLIC PANEL
Anion gap: 7 (ref 5–15)
BUN: 21 mg/dL (ref 8–23)
CO2: 26 mmol/L (ref 22–32)
Calcium: 8.8 mg/dL — ABNORMAL LOW (ref 8.9–10.3)
Chloride: 105 mmol/L (ref 98–111)
Creatinine, Ser: 1.49 mg/dL — ABNORMAL HIGH (ref 0.61–1.24)
GFR, Estimated: 50 mL/min — ABNORMAL LOW (ref >60)
Glucose, Bld: 98 mg/dL (ref 70–99)
Potassium: 3.7 mmol/L (ref 3.5–5.1)
Sodium: 138 mmol/L (ref 135–145)

## 2021-05-06 LAB — MAGNESIUM: Magnesium: 2.1 mg/dL (ref 1.7–2.4)

## 2021-05-06 LAB — HIV ANTIBODY (ROUTINE TESTING W REFLEX): HIV Screen 4th Generation wRfx: NONREACTIVE

## 2021-05-06 MED ORDER — RIVAROXABAN 20 MG PO TABS: 20.0000 mg | ORAL_TABLET | Freq: Every day | ORAL | Status: DC

## 2021-05-06 MED ORDER — RIVAROXABAN 20 MG PO TABS: 20.0000 mg | ORAL_TABLET | Freq: Every day | ORAL | 2 refills | Status: DC

## 2021-05-06 MED ORDER — RIVAROXABAN 15 MG PO TABS
15.0000 mg | ORAL_TABLET | Freq: Two times a day (BID) | ORAL | Status: DC
  Administered 2021-05-06: 15 mg via ORAL
  Filled 2021-05-06: qty 1

## 2021-05-06 MED ORDER — RIVAROXABAN (XARELTO) VTE STARTER PACK (15 & 20 MG): ORAL_TABLET | ORAL | 0 refills | Status: DC

## 2021-05-06 MED ORDER — FERROUS SULFATE 325 (65 FE) MG PO TABS
325.0000 mg | ORAL_TABLET | Freq: Every day | ORAL | Status: DC
  Administered 2021-05-06: 325 mg via ORAL
  Filled 2021-05-06: qty 1

## 2021-05-06 NOTE — Progress Notes (Signed)
ANTICOAGULATION CONSULT NOTE  Pharmacy Consult for Heparin Indication: Pulmonary embolus  Allergies  Allergen Reactions   Demerol Other (See Comments)    "Makes me crazy."   Patient Measurements: Height: 6' (182.9 cm) Weight: 113.4 kg (250 lb) IBW/kg (Calculated) : 77.6 HEPARIN DW (KG): 101.9  Vital Signs: Temp: 97.9 F (36.6 C) (12/05 1239) Temp Source: Oral (12/05 1239) BP: 101/59 (12/05 1239) Pulse Rate: 70 (12/05 1239)  Labs: Recent Labs    05/05/21 1024 05/05/21 1029 05/05/21 1158 05/05/21 2115 05/06/21 0406 05/06/21 1400  HGB 15.2  --   --  14.0 13.9  --   HCT 45.5  --   --  42.1 41.0  --   PLT 71*  --   --  67* 67*  --   LABPROT  --  13.9  --   --   --   --   INR  --  1.1  --   --   --   --   HEPARINUNFRC  --   --   --  0.79* 0.84* 0.58  CREATININE 1.48*  --   --   --  1.49*  --   TROPONINIHS 4  --  4  --   --   --      Estimated Creatinine Clearance: 60.8 mL/min (A) (by C-G formula based on SCr of 1.49 mg/dL (H)).  Assessment: HL 0.58- therapeutic  Goal of Therapy:  Heparin level 0.3-0.7 units/ml Monitor platelets by anticoagulation protocol: Yes   Plan:  Continue Heparin infusion at 1450 units/hr  F/U transition to [place] level with AM labs, and adjust rate if needed at that time   Margot Ables, PharmD Clinical Pharmacist 05/06/2021 2:45 PM

## 2021-05-06 NOTE — Discharge Instructions (Addendum)
Information on my medicine - XARELTO (rivaroxaban)  This medication education was reviewed with me or my healthcare representative as part of my discharge preparation.  The pharmacist that spoke with me during my hospital stay was:  Ramond Craver, [place]? Xarelto was prescribed to treat blood clots that may have been found in the veins of your legs (deep vein thrombosis) or in your lungs (pulmonary embolism) and to reduce the risk of them occurring again.  What do you need to know about Xarelto? The starting dose is one 15 mg tablet taken TWICE daily with food for the FIRST 21 DAYS then on 12/26 PM dose the dose is changed to one 20 mg tablet taken ONCE A DAY with your evening meal.  DO NOT stop taking Xarelto without talking to the health care provider who prescribed the medication.  Refill your prescription for 20 mg tablets before you run out.  After discharge, you should have regular check-up appointments with your healthcare provider that is prescribing your Xarelto.  In the future your dose may need to be changed if your kidney function changes by a significant amount.  What do you do if you miss a dose? If you are taking Xarelto TWICE DAILY and you miss a dose, take it as soon as you remember. You may take two 15 mg tablets (total 30 mg) at the same time then resume your regularly scheduled 15 mg twice daily the next day.  If you are taking Xarelto ONCE DAILY and you miss a dose, take it as soon as you remember on the same day then continue your regularly scheduled once daily regimen the next day. Do not take two doses of Xarelto at the same time.   Important Safety Information Xarelto is a blood thinner medicine that can cause bleeding. You should call your healthcare provider right away if you experience any of the following: Bleeding from an injury or your nose that does not stop. Unusual colored urine (red or dark brown) or unusual colored  stools (red or black). Unusual bruising for unknown reasons. A serious fall or if you hit your head (even if there is no bleeding).  Some medicines may interact with Xarelto and might increase your risk of bleeding while on Xarelto. To help avoid this, consult your healthcare provider or pharmacist prior to using any new prescription or non-prescription medications, including herbals, vitamins, non-steroidal anti-inflammatory drugs (NSAIDs) and supplements.  This website has more information on Xarelto: [email or web address]

## 2021-05-06 NOTE — Progress Notes (Signed)
PROGRESS NOTE    [name]  MBE:[id number] DOB: 1951/06/02 DOA: 05/05/2021 PCP: Redmond School, MD   Brief Narrative:   [name] is a 70 y.o. male with medical history significant for lymphoma, thrombocytopenia, massive splenomegaly, GERD, hypertension, gastroparesis, CKD stage IIIa, and CAD/dyslipidemia who presented to the ED with right-sided chest pain that began upon waking up on the morning of admission.  He was diagnosed with acute hypoxemic respiratory failure/chest pain in the setting of acute right-sided PE with small pulmonary infarct and has been started on heparin drip.  2D echocardiogram is mostly unrevealing and ultrasound Dopplers were negative for DVT.  He continues to have ongoing chest pain.  Assessment & Plan:   Principal Problem:   Acute pulmonary embolism (HCC)   Acute hypoxemic respiratory failure with chest pain secondary to acute right-sided PE with small pulmonary infarct -Hypoxemia has resolved -Continue heparin drip -PESI score of 109 with 4-11% 30-day mortality and therefore high risk -2D echocardiogram with LVEF 50-55% with basal inferior hypokinesis which will be followed up with cardiology outpatient -No DVT noted on ultrasound of lower extremities -Continues to have significant chest pain which will require ongoing monitoring -Per Dr. Delton Coombes, okay to start and keep on Eliquis with platelet counts over 50,000 -Awaiting discussion with Dr. Caprice Beaver per family request   Chronic thrombocytopenia -Monitor closely while on heparin drip   CAD/dyslipidemia/htn -Hold blood pressure agents for now and monitor closely with as needed medications given soft blood pressure -Continue statin   History of lymphoma -Follows with Dr. Delton Coombes and Dr. Caprice Beaver at [facility] [phone] -Also has hx of stage III melanoma s/p resection   CKD stage IIIa -Currently stable   GERD -Continue PPI   DVT prophylaxis: Heparin drip Code Status:  Full Family Communication: Discussed with spouse at bedside 12/5 Disposition Plan:  Status is: Observation  The patient will require care spanning > 2 midnights and should be moved to inpatient because: Ongoing chest pain with need for IV heparin.  Consultants:  Discussed with Dr. Delton Coombes, oncology  Procedures:  See below  Antimicrobials:  None   Subjective: Patient seen and evaluated today with ongoing significant chest pain noted.  He is not comfortable with going home in the setting of his ongoing chest pain.  He no longer requires oxygen.  Blood pressures have been borderline low.  Objective: Vitals:   05/05/21 1931 05/05/21 2324 05/06/21 0342 05/06/21 1239  BP: 95/67 (!) 88/62 (!) 81/43 (!) 101/59  Pulse: 60 (!) 51 (!) 58 70  Resp: 16 16  18   Temp: 97.9 F (36.6 C) 98.6 F (37 C) 98.2 F (36.8 C) 97.9 F (36.6 C)  TempSrc: Oral Oral Oral Oral  SpO2: 96% 95% 92% 92%  Weight:      Height:        Intake/Output Summary (Last 24 hours) at 05/06/2021 1454 Last data filed at 05/06/2021 1300 Gross per 24 hour  Intake 746.39 ml  Output --  Net 746.39 ml   Filed Weights   05/05/21 1007  Weight: 113.4 kg    Examination:  General exam: Appears calm and comfortable  Respiratory system: Clear to auscultation. Respiratory effort normal. Cardiovascular system: S1 & S2 heard, RRR.  Gastrointestinal system: Abdomen is soft Central nervous system: Alert and awake Extremities: No edema Skin: No significant lesions noted Psychiatry: Flat affect.    Data Reviewed: I have personally reviewed following labs and imaging studies  CBC: Recent Labs  Lab 05/05/21 1024 05/05/21 2115  05/06/21 0406  WBC 5.9 5.1 4.8  HGB 15.2 14.0 13.9  HCT 45.5 42.1 41.0  MCV 96.6 97.9 96.2  PLT 71* 67* 67*   Basic Metabolic Panel: Recent Labs  Lab 05/05/21 1024 05/05/21 1029 05/06/21 0406  NA 141  --  138  K 4.1  --  3.7  CL 104  --  105  CO2 29  --  26  GLUCOSE 94  --  98   BUN 21  --  21  CREATININE 1.48*  --  1.49*  CALCIUM 9.3  --  8.8*  MG  --  2.1 2.1   GFR: Estimated Creatinine Clearance: 60.8 mL/min (A) (by C-G formula based on SCr of 1.49 mg/dL (H)). Liver Function Tests: No results for input(s): AST, ALT, ALKPHOS, BILITOT, PROT, ALBUMIN in the last 168 hours. Recent Labs  Lab 05/05/21 1024  LIPASE 32   No results for input(s): AMMONIA in the last 168 hours. Coagulation Profile: Recent Labs  Lab 05/05/21 1029  INR 1.1   Cardiac Enzymes: No results for input(s): CKTOTAL, CKMB, CKMBINDEX, TROPONINI in the last 168 hours. BNP (last 3 results) No results for input(s): PROBNP in the last 8760 hours. HbA1C: No results for input(s): HGBA1C in the last 72 hours. CBG: Recent Labs  Lab 05/05/21 1059  GLUCAP 80   Lipid Profile: No results for input(s): CHOL, HDL, LDLCALC, TRIG, CHOLHDL, LDLDIRECT in the last 72 hours. Thyroid Function Tests: No results for input(s): TSH, T4TOTAL, FREET4, T3FREE, THYROIDAB in the last 72 hours. Anemia Panel: No results for input(s): VITAMINB12, FOLATE, FERRITIN, TIBC, IRON, RETICCTPCT in the last 72 hours. Sepsis Labs: No results for input(s): PROCALCITON, LATICACIDVEN in the last 168 hours.  Recent Results (from the past 240 hour(s))  Resp Panel by RT-PCR (Flu A&B, Covid) Nasopharyngeal Swab     Status: None   Collection Time: 05/05/21  2:17 PM   Specimen: Nasopharyngeal Swab; Nasopharyngeal(NP) swabs in vial transport medium  Result Value Ref Range Status   SARS Coronavirus 2 by RT PCR NEGATIVE NEGATIVE Final    Comment: (NOTE) SARS-CoV-2 target nucleic acids are NOT DETECTED.  The SARS-CoV-2 RNA is generally detectable in upper respiratory specimens during the acute phase of infection. The lowest concentration of SARS-CoV-2 viral copies this assay can detect is 138 copies/mL. A negative result does not preclude SARS-Cov-2 infection and should not be used as the sole basis for treatment or other  patient management decisions. A negative result may occur with  improper specimen collection/handling, submission of specimen other than nasopharyngeal swab, presence of viral mutation(s) within the areas targeted by this assay, and inadequate number of viral copies(<138 copies/mL). A negative result must be combined with clinical observations, patient history, and epidemiological information. The expected result is Negative.  Fact Sheet for Patients:  EntrepreneurPulse.com.au  Fact Sheet for Healthcare Providers:  IncredibleEmployment.be  This test is no t yet approved or cleared by the [place] FDA and  has been authorized for detection and/or diagnosis of SARS-CoV-2 by FDA under an Emergency Use Authorization (EUA). This EUA will remain  in effect (meaning this test can be used) for the duration of the COVID-19 declaration under Section 564(b)(1) of the Act, 21 U.S.C.section 360bbb-3(b)(1), unless the authorization is terminated  or revoked sooner.       Influenza A by PCR NEGATIVE NEGATIVE Final   Influenza B by PCR NEGATIVE NEGATIVE Final    Comment: (NOTE) The Xpert Xpress SARS-CoV-2/FLU/RSV plus assay is intended as an aid  in the diagnosis of influenza from Nasopharyngeal swab specimens and should not be used as a sole basis for treatment. Nasal washings and aspirates are unacceptable for Xpert Xpress SARS-CoV-2/FLU/RSV testing.  Fact Sheet for Patients: EntrepreneurPulse.com.au  Fact Sheet for Healthcare Providers: IncredibleEmployment.be  This test is not yet approved or cleared by the [place] FDA and has been authorized for detection and/or diagnosis of SARS-CoV-2 by FDA under an Emergency Use Authorization (EUA). This EUA will remain in effect (meaning this test can be used) for the duration of the COVID-19 declaration under Section 564(b)(1) of the Act, 21 U.S.C. section  360bbb-3(b)(1), unless the authorization is terminated or revoked.  Performed at [facility], [address] [place], [place] [address]          Radiology Studies: DG Chest 2 View  Result Date: 05/05/2021 CLINICAL DATA:  Right-sided chest pain. EXAM: CHEST - 2 VIEW COMPARISON:  August 25, 2018 FINDINGS: Enlarged cardiac silhouette. Tortuosity and calcific atherosclerotic disease of the aorta. Low lung volumes without evidence of pleural effusion, pneumothorax or consolidation. Mild atelectasis in the bilateral lung bases. Stable postsurgical changes from thoracic and spinal fusion, and nerve stimulator placement. Osseous structures are without acute abnormality. Soft tissues are grossly normal. IMPRESSION: 1. Enlarged cardiac silhouette. 2. Low lung volumes with mild bibasilar atelectasis. Electronically Signed   By: Fidela Salisbury M.D.   On: 05/05/2021 11:18   CT Angio Chest PE W and/or Wo Contrast  Result Date: 05/05/2021 CLINICAL DATA:  PE suspected.  Right-sided chest pain. EXAM: CT ANGIOGRAPHY CHEST WITH CONTRAST TECHNIQUE: Multidetector CT imaging of the chest was performed using the standard protocol during bolus administration of intravenous contrast. Multiplanar CT image reconstructions and MIPs were obtained to evaluate the vascular anatomy. CONTRAST:  67mL OMNIPAQUE IOHEXOL 350 MG/ML SOLN COMPARISON:  None. FINDINGS: Cardiovascular: Satisfactory opacification of the pulmonary arteries to the segmental level. Segmental pulmonary emboli within the right middle and lower lobe are partially occlusive. Enlarged heart. No evidence of right heart strain. Heavy calcific atherosclerotic disease of the coronary arteries. Mediastinum/Nodes: No enlarged mediastinal, hilar, or axillary lymph nodes. Thyroid gland, and trachea demonstrate no significant findings. Fundoplication. Diffuse dilation of the upstream esophagus. Lungs/Pleura: Peripheral ground-glass opacity in the lateral aspect of the  right middle lobe. Minimal right lower lobe atelectasis. Upper Abdomen: No acute abnormality. Musculoskeletal: Prior posterior spinal fusion without evidence of hardware failure. Nerve stimulator in place. Review of the MIP images confirms the above findings. IMPRESSION: 1. Segmental pulmonary emboli within the right middle and lower lobe are partially occlusive. 2. Peripheral ground-glass opacity in the lateral aspect of the right middle lobe likely represents a small pulmonary infarct. 3. Enlarged heart. 4. Heavy calcific atherosclerotic disease of the coronary arteries. 5. Diffuse dilation of the upstream esophagus. 6. Fundoplication. 7. Aortic atherosclerosis. Aortic Atherosclerosis (ICD10-I70.0). These results were called by telephone at the time of interpretation on 05/05/2021 at 1:59 pm to provider [facility] TRIPLETT , who verbally acknowledged these results. Electronically Signed   By: Fidela Salisbury M.D.   On: 05/05/2021 14:01   ECHOCARDIOGRAM COMPLETE  Result Date: 05/06/2021    ECHOCARDIOGRAM REPORT   Patient Name:   [name] [facility] Date of Exam: 05/06/2021 Medical Rec #:  [id number]      Height:       72.0 in Accession #:    [id number]     Weight:       250.0 lb Date of Birth:  Mar 09, 1951      BSA:  2.343 m Patient Age:    35 years       BP:           81/43 mmHg Patient Gender: M              HR:           58 bpm. Exam Location:  [facility] Procedure: 2D Echo, Cardiac Doppler and Color Doppler Indications:    Pulmonary Embolus  History:        Patient has no prior history of Echocardiogram examinations.                 CAD; Risk Factors:Hypertension. History of stents.  Sonographer:    Wenda Low Referring Phys: [id number] [place] [facility]  Sonographer Comments: Patient is morbidly obese. IMPRESSIONS  1. Left ventricular ejection fraction, by estimation, is 50 to 55%. The left ventricle has low normal function. The left ventricle demonstrates regional wall motion abnormalities. The basal  inferior wall appears hypokinetic. The rest of the LV segments have low normal contractility.There is moderate concentric left ventricular hypertrophy. Left ventricular diastolic parameters are consistent with Grade I diastolic dysfunction (impaired relaxation).  2. Right ventricular systolic function is normal. The right ventricular size is mildly enlarged. There is normal pulmonary artery systolic pressure. The estimated right ventricular systolic pressure is 42.5 mmHg.  3. The mitral valve is normal in structure. Trivial mitral valve regurgitation.  4. The aortic valve is tricuspid. There is mild thickening of the aortic valve. Aortic valve regurgitation is mild. Aortic valve sclerosis is present, with no evidence of aortic valve stenosis.  5. Aortic dilatation noted. There is mild dilatation of the aortic root, measuring 41 mm. There is mild-to-moderate dilatation of the ascending aorta, measuring 44 mm.  6. The inferior vena cava is normal in size with greater than 50% respiratory variability, suggesting right atrial pressure of 3 mmHg. Comparison(s): No prior Echocardiogram. FINDINGS  Left Ventricle: Left ventricular ejection fraction, by estimation, is 50 to 55%. The left ventricle has low normal function. The left ventricle demonstrates regional wall motion abnormalities. The basal inferior wall appears hypokinetic. The rest of the  LV segments have low normal contractility The left ventricular internal cavity size was normal in size. There is moderate concentric left ventricular hypertrophy. Left ventricular diastolic parameters are consistent with Grade I diastolic dysfunction (impaired relaxation). Right Ventricle: The right ventricular size is mildly enlarged. Right vetricular wall thickness was not well visualized. Right ventricular systolic function is normal. There is normal pulmonary artery systolic pressure. The tricuspid regurgitant velocity  is 2.74 m/s, and with an assumed right atrial pressure  of 3 mmHg, the estimated right ventricular systolic pressure is 95.6 mmHg. Left Atrium: Left atrial size was normal in size. Right Atrium: Right atrial size was normal in size. Pericardium: There is no evidence of pericardial effusion. Mitral Valve: The mitral valve is normal in structure. There is mild thickening of the mitral valve leaflet(s). Mild mitral annular calcification. Trivial mitral valve regurgitation. MV peak gradient, 2.6 mmHg. The mean mitral valve gradient is 1.0 mmHg. Tricuspid Valve: The tricuspid valve is normal in structure. Tricuspid valve regurgitation is trivial. Aortic Valve: The aortic valve is tricuspid. There is mild thickening of the aortic valve. Aortic valve regurgitation is mild. Aortic regurgitation PHT measures 784 msec. Aortic valve sclerosis is present, with no evidence of aortic valve stenosis. Aortic valve mean gradient measures 3.0 mmHg. Aortic valve peak gradient measures 6.9 mmHg. Aortic valve area, by VTI measures  2.70 cm. Pulmonic Valve: The pulmonic valve was normal in structure. Pulmonic valve regurgitation is trivial. Aorta: Aortic dilatation noted. There is mild dilatation of the aortic root, measuring 41 mm. There is mild-to-moderate dilatation of the ascending aorta, measuring 44 mm. Venous: The inferior vena cava is normal in size with greater than 50% respiratory variability, suggesting right atrial pressure of 3 mmHg. IAS/Shunts: No atrial level shunt detected by color flow Doppler.  LEFT VENTRICLE PLAX 2D LVIDd:         5.40 cm     Diastology LVIDs:         3.70 cm     LV e' medial:    7.83 cm/s LV PW:         1.40 cm     LV E/e' medial:  6.3 LV IVS:        1.40 cm     LV e' lateral:   8.05 cm/s LVOT diam:     2.00 cm     LV E/e' lateral: 6.1 LV SV:         78 LV SV Index:   33 LVOT Area:     3.14 cm  LV Volumes (MOD) LV vol d, MOD A2C: 81.1 ml LV vol d, MOD A4C: 91.5 ml LV vol s, MOD A2C: 46.4 ml LV vol s, MOD A4C: 45.2 ml LV SV MOD A2C:     34.7 ml LV SV MOD  A4C:     91.5 ml LV SV MOD BP:      38.7 ml RIGHT VENTRICLE RV Basal diam:  3.10 cm RV Mid diam:    3.60 cm RV S prime:     12.60 cm/s TAPSE (M-mode): 2.4 cm LEFT ATRIUM             Index        RIGHT ATRIUM           Index LA diam:        4.30 cm 1.84 cm/m   RA Area:     19.90 cm LA Vol (A2C):   60.5 ml 25.83 ml/m  RA Volume:   50.10 ml  21.39 ml/m LA Vol (A4C):   77.4 ml 33.04 ml/m LA Biplane Vol: 73.9 ml 31.55 ml/m  AORTIC VALVE                    PULMONIC VALVE AV Area (Vmax):    2.42 cm     PV Vmax:       0.62 m/s AV Area (Vmean):   2.29 cm     PV Peak grad:  1.5 mmHg AV Area (VTI):     2.70 cm AV Vmax:           131.00 cm/s AV Vmean:          85.100 cm/s AV VTI:            0.289 m AV Peak Grad:      6.9 mmHg AV Mean Grad:      3.0 mmHg LVOT Vmax:         101.00 cm/s LVOT Vmean:        61.900 cm/s LVOT VTI:          0.248 m LVOT/AV VTI ratio: 0.86 AI PHT:            784 msec  AORTA Ao Root diam: 4.10 cm Ao Asc diam:  4.40 cm MITRAL VALVE  TRICUSPID VALVE MV Area (PHT): 3.37 cm    TR Peak grad:   30.0 mmHg MV Area VTI:   2.75 cm    TR Vmax:        274.00 cm/s MV Peak grad:  2.6 mmHg MV Mean grad:  1.0 mmHg    SHUNTS MV Vmax:       0.80 m/s    Systemic VTI:  0.25 m MV Vmean:      45.7 cm/s   Systemic Diam: 2.00 cm MV Decel Time: 225 msec MV E velocity: 49.30 cm/s MV A velocity: 71.10 cm/s MV E/A ratio:  0.69 Gwyndolyn Kaufman MD Electronically signed by Gwyndolyn Kaufman MD Signature Date/Time: 05/06/2021/12:42:41 PM    Final         Scheduled Meds:  calcium-vitamin D  1 tablet Oral BID   ferrous sulfate  325 mg Oral Q breakfast   hydrocortisone  10 mg Oral QPM   And   hydrocortisone  20 mg Oral q morning   loratadine  10 mg Oral Daily   metoCLOPramide  10 mg Oral BID   montelukast  10 mg Oral Daily   multivitamin with minerals  1 tablet Oral Daily   omega-3 acid ethyl esters  1 g Oral BID   ranolazine  500 mg Oral BID   rosuvastatin  10 mg Oral QPM   sodium chloride  flush  3 mL Intravenous Q12H   Continuous Infusions:  sodium chloride     heparin 1,450 Units/hr (05/06/21 0833)     LOS: 0 days    Time spent: 35 minutes    Dantrell Schertzer Darleen Crocker, DO [facility]  If 7PM-7AM, please contact night-coverage [email or web address] 05/06/2021, 2:54 PM

## 2021-05-06 NOTE — Care Management Obs Status (Signed)
[place] NOTIFICATION   Patient Details  Name: [name] MRN: [id number] Date of Birth: 1951/03/26   Medicare Observation Status Notification Given:  Yes    Tommy Medal 05/06/2021, 3:35 PM

## 2021-05-06 NOTE — TOC Benefit Eligibility Note (Signed)
Findings of benefits investigation via test claims at [facility]:   Insurance: [employer] Medicare  Eliquis starter pack requires PA Xarelto is preferred with a $45 copay.

## 2021-05-06 NOTE — Telephone Encounter (Signed)
-----   Message from Freada Bergeron, MD sent at 05/06/2021  1:13 PM EST ----- Regarding: Patient appointment Can we just get him scheduled to see [place] for coronary Ca seen on CT and low normal EF?  Thank you so much!!!  -Nira Conn

## 2021-05-06 NOTE — Progress Notes (Signed)
*  PRELIMINARY RESULTS* Echocardiogram 2D Echocardiogram has been performed.  [name] 05/06/2021, 12:14 PM

## 2021-05-06 NOTE — Discharge Summary (Signed)
Physician Discharge Summary  [name] MVE:[id number] DOB: Aug 02, 1950 DOA: 05/05/2021  PCP: Redmond School, MD  Admit date: 05/05/2021  Discharge date: 05/06/2021  Admitted From:Home  Disposition:  Home  Recommendations for Outpatient Follow-up:  Follow up with PCP in 1-2 weeks, check CBC in 1 week Continue on Xarelto as prescribed with starter pack and then 20 mg daily as prescribed below 2D echocardiogram with some wall motion abnormalities with plans to follow-up with cardiology in outpatient setting with referral sent Follow-up with oncology Dr. Delton Coombes and Dr. Caprice Beaver as scheduled Continue on other home medications as prior  Home Health: None  Equipment/Devices: None  Discharge Condition:Stable  CODE STATUS: Full  Diet recommendation: Heart Healthy  Brief/Interim Summary:  [name] is a 70 y.o. male with medical history significant for lymphoma, thrombocytopenia, massive splenomegaly, GERD, hypertension, gastroparesis, CKD stage IIIa, and CAD/dyslipidemia who presented to the ED with right-sided chest pain that began upon waking up on the morning of admission.  He was diagnosed with acute hypoxemic respiratory failure/chest pain in the setting of acute right-sided PE with small pulmonary infarct and had been started on heparin drip.  He had tolerated the treatment well with improvement in his symptoms including his chest pain and shortness of breath.  His hypoxemia has now resolved and he is currently on room air.  Discussion was had with Dr. Delton Coombes regarding his future treatment plans and is decided that oral anticoagulation would be appropriate as long as his platelet counts remain above 50,000 which may have.  Per his insurance, he will be discharged on Xarelto.  He was not noted to have any DVTs in his 2D echocardiogram demonstrated LVEF 50-55% with basal inferior hypokinesis.  This was discussed with cardiology with plans to follow-up in the outpatient  setting.  He is to continue follow-up at his other routine appointments with oncology as scheduled.  Discharge Diagnoses:  Principal Problem:   Acute pulmonary embolism ([place])  Principal discharge diagnosis: Acute hypoxemic respiratory failure secondary to acute right-sided PE with small pulmonary infarct in the setting of lymphoma and thrombocytopenia.  Discharge Instructions  Discharge Instructions     Diet - low sodium heart healthy   Complete by: As directed    Increase activity slowly   Complete by: As directed       Allergies as of 05/06/2021       Reactions   Demerol Other (See Comments)   "Makes me crazy."        Medication List     TAKE these medications    calcium-vitamin D 500-200 MG-UNIT tablet Commonly known as: OSCAL WITH D Take 1 tablet by mouth 2 (two) times daily.   cyclobenzaprine 10 MG tablet Commonly known as: FLEXERIL Take 1 tablet (10 mg total) by mouth 3 (three) times daily as needed for muscle spasms.   Dexilant 60 MG capsule Generic drug: dexlansoprazole Take 60 mg by mouth daily.   diazepam 10 MG tablet Commonly known as: VALIUM Take 10 mg by mouth 4 (four) times daily as needed (for back pain).   Ester-C 500-550 MG Tabs Take 1 tablet by mouth daily.   fexofenadine 180 MG tablet Commonly known as: ALLEGRA Take 180 mg by mouth every evening.   fish oil-omega-3 fatty acids 1000 MG capsule Take 1 g by mouth 2 (two) times daily.   GOODY HEADACHE PO Take 1-2 packets by mouth daily as needed (headaches).   hydrocortisone 10 MG tablet Commonly known as: CORTEF Take 10 mg  by mouth See admin instructions. 10 mg every evening and 20 mg every morning   isosorbide mononitrate 30 MG 24 hr tablet Commonly known as: IMDUR Take 30 mg by mouth 2 (two) times daily.   lisinopril 10 MG tablet Commonly known as: ZESTRIL Take 10 mg by mouth daily.   metoCLOPramide 10 MG tablet Commonly known as: REGLAN Take 1 tablet by mouth 2 (two) times  daily.   montelukast 10 MG tablet Commonly known as: SINGULAIR Take 10 mg by mouth daily.   multivitamin with minerals Tabs tablet Take 1 tablet by mouth daily.   ondansetron 8 MG tablet Commonly known as: ZOFRAN Take by mouth.   oxyCODONE-acetaminophen 10-325 MG tablet Commonly known as: PERCOCET Take 1 tablet by mouth every 6 (six) hours as needed for pain.   ranolazine 500 MG 12 hr tablet Commonly known as: RANEXA Take 500 mg by mouth 2 (two) times daily.   Rivaroxaban Stater Pack (15 mg and 20 mg) Commonly known as: XARELTO STARTER PACK Follow package directions: Take one 15mg  tablet by mouth twice a day. On day 22, switch to one 20mg  tablet once a day. Take with food.   rivaroxaban 20 MG Tabs tablet Commonly known as: XARELTO Take 1 tablet (20 mg total) by mouth daily with supper. Start taking on: May 29, 2021   rosuvastatin 10 MG tablet Commonly known as: CRESTOR Take 10 mg by mouth every evening.   sildenafil 100 MG tablet Commonly known as: VIAGRA Take 50 mg by mouth as needed for erectile dysfunction.        Follow-up Information     Redmond School, MD. Schedule an appointment as soon as possible for a visit in 1 week(s).   Specialty: Internal Medicine Contact information: [address] [place] [address] [phone]                Allergies  Allergen Reactions   Demerol Other (See Comments)    "Makes me crazy."    Consultations: None   Procedures/Studies: DG Chest 2 View  Result Date: 05/05/2021 CLINICAL DATA:  Right-sided chest pain. EXAM: CHEST - 2 VIEW COMPARISON:  August 25, 2018 FINDINGS: Enlarged cardiac silhouette. Tortuosity and calcific atherosclerotic disease of the aorta. Low lung volumes without evidence of pleural effusion, pneumothorax or consolidation. Mild atelectasis in the bilateral lung bases. Stable postsurgical changes from thoracic and spinal fusion, and nerve stimulator placement. Osseous  structures are without acute abnormality. Soft tissues are grossly normal. IMPRESSION: 1. Enlarged cardiac silhouette. 2. Low lung volumes with mild bibasilar atelectasis. Electronically Signed   By: Fidela Salisbury M.D.   On: 05/05/2021 11:18   CT Angio Chest PE W and/or Wo Contrast  Result Date: 05/05/2021 CLINICAL DATA:  PE suspected.  Right-sided chest pain. EXAM: CT ANGIOGRAPHY CHEST WITH CONTRAST TECHNIQUE: Multidetector CT imaging of the chest was performed using the standard protocol during bolus administration of intravenous contrast. Multiplanar CT image reconstructions and MIPs were obtained to evaluate the vascular anatomy. CONTRAST:  84mL OMNIPAQUE IOHEXOL 350 MG/ML SOLN COMPARISON:  None. FINDINGS: Cardiovascular: Satisfactory opacification of the pulmonary arteries to the segmental level. Segmental pulmonary emboli within the right middle and lower lobe are partially occlusive. Enlarged heart. No evidence of right heart strain. Heavy calcific atherosclerotic disease of the coronary arteries. Mediastinum/Nodes: No enlarged mediastinal, hilar, or axillary lymph nodes. Thyroid gland, and trachea demonstrate no significant findings. Fundoplication. Diffuse dilation of the upstream esophagus. Lungs/Pleura: Peripheral ground-glass opacity in the lateral aspect of the right  middle lobe. Minimal right lower lobe atelectasis. Upper Abdomen: No acute abnormality. Musculoskeletal: Prior posterior spinal fusion without evidence of hardware failure. Nerve stimulator in place. Review of the MIP images confirms the above findings. IMPRESSION: 1. Segmental pulmonary emboli within the right middle and lower lobe are partially occlusive. 2. Peripheral ground-glass opacity in the lateral aspect of the right middle lobe likely represents a small pulmonary infarct. 3. Enlarged heart. 4. Heavy calcific atherosclerotic disease of the coronary arteries. 5. Diffuse dilation of the upstream esophagus. 6.  Fundoplication. 7. Aortic atherosclerosis. Aortic Atherosclerosis (ICD10-I70.0). These results were called by telephone at the time of interpretation on 05/05/2021 at 1:59 pm to provider [facility] , who verbally acknowledged these results. Electronically Signed   By: Fidela Salisbury M.D.   On: 05/05/2021 14:01   ECHOCARDIOGRAM COMPLETE  Result Date: 05/06/2021    ECHOCARDIOGRAM REPORT   Patient Name:   [name] [facility] Date of Exam: 05/06/2021 Medical Rec #:  [id number]      Height:       72.0 in Accession #:    [id number]     Weight:       250.0 lb Date of Birth:  1950/07/30      BSA:          2.343 m Patient Age:    89 years       BP:           81/43 mmHg Patient Gender: M              HR:           58 bpm. Exam Location:  [facility] Procedure: 2D Echo, Cardiac Doppler and Color Doppler Indications:    Pulmonary Embolus  History:        Patient has no prior history of Echocardiogram examinations.                 CAD; Risk Factors:Hypertension. History of stents.  Sonographer:    Wenda Low Referring Phys: [id number] [place] [facility]  Sonographer Comments: Patient is morbidly obese. IMPRESSIONS  1. Left ventricular ejection fraction, by estimation, is 50 to 55%. The left ventricle has low normal function. The left ventricle demonstrates regional wall motion abnormalities. The basal inferior wall appears hypokinetic. The rest of the LV segments have low normal contractility.There is moderate concentric left ventricular hypertrophy. Left ventricular diastolic parameters are consistent with Grade I diastolic dysfunction (impaired relaxation).  2. Right ventricular systolic function is normal. The right ventricular size is mildly enlarged. There is normal pulmonary artery systolic pressure. The estimated right ventricular systolic pressure is 09.4 mmHg.  3. The mitral valve is normal in structure. Trivial mitral valve regurgitation.  4. The aortic valve is tricuspid. There is mild thickening of the aortic  valve. Aortic valve regurgitation is mild. Aortic valve sclerosis is present, with no evidence of aortic valve stenosis.  5. Aortic dilatation noted. There is mild dilatation of the aortic root, measuring 41 mm. There is mild-to-moderate dilatation of the ascending aorta, measuring 44 mm.  6. The inferior vena cava is normal in size with greater than 50% respiratory variability, suggesting right atrial pressure of 3 mmHg. Comparison(s): No prior Echocardiogram. FINDINGS  Left Ventricle: Left ventricular ejection fraction, by estimation, is 50 to 55%. The left ventricle has low normal function. The left ventricle demonstrates regional wall motion abnormalities. The basal inferior wall appears hypokinetic. The rest of the  LV segments have low normal contractility The left ventricular internal  cavity size was normal in size. There is moderate concentric left ventricular hypertrophy. Left ventricular diastolic parameters are consistent with Grade I diastolic dysfunction (impaired relaxation). Right Ventricle: The right ventricular size is mildly enlarged. Right vetricular wall thickness was not well visualized. Right ventricular systolic function is normal. There is normal pulmonary artery systolic pressure. The tricuspid regurgitant velocity  is 2.74 m/s, and with an assumed right atrial pressure of 3 mmHg, the estimated right ventricular systolic pressure is 41.6 mmHg. Left Atrium: Left atrial size was normal in size. Right Atrium: Right atrial size was normal in size. Pericardium: There is no evidence of pericardial effusion. Mitral Valve: The mitral valve is normal in structure. There is mild thickening of the mitral valve leaflet(s). Mild mitral annular calcification. Trivial mitral valve regurgitation. MV peak gradient, 2.6 mmHg. The mean mitral valve gradient is 1.0 mmHg. Tricuspid Valve: The tricuspid valve is normal in structure. Tricuspid valve regurgitation is trivial. Aortic Valve: The aortic valve is  tricuspid. There is mild thickening of the aortic valve. Aortic valve regurgitation is mild. Aortic regurgitation PHT measures 784 msec. Aortic valve sclerosis is present, with no evidence of aortic valve stenosis. Aortic valve mean gradient measures 3.0 mmHg. Aortic valve peak gradient measures 6.9 mmHg. Aortic valve area, by VTI measures 2.70 cm. Pulmonic Valve: The pulmonic valve was normal in structure. Pulmonic valve regurgitation is trivial. Aorta: Aortic dilatation noted. There is mild dilatation of the aortic root, measuring 41 mm. There is mild-to-moderate dilatation of the ascending aorta, measuring 44 mm. Venous: The inferior vena cava is normal in size with greater than 50% respiratory variability, suggesting right atrial pressure of 3 mmHg. IAS/Shunts: No atrial level shunt detected by color flow Doppler.  LEFT VENTRICLE PLAX 2D LVIDd:         5.40 cm     Diastology LVIDs:         3.70 cm     LV e' medial:    7.83 cm/s LV PW:         1.40 cm     LV E/e' medial:  6.3 LV IVS:        1.40 cm     LV e' lateral:   8.05 cm/s LVOT diam:     2.00 cm     LV E/e' lateral: 6.1 LV SV:         78 LV SV Index:   33 LVOT Area:     3.14 cm  LV Volumes (MOD) LV vol d, MOD A2C: 81.1 ml LV vol d, MOD A4C: 91.5 ml LV vol s, MOD A2C: 46.4 ml LV vol s, MOD A4C: 45.2 ml LV SV MOD A2C:     34.7 ml LV SV MOD A4C:     91.5 ml LV SV MOD BP:      38.7 ml RIGHT VENTRICLE RV Basal diam:  3.10 cm RV Mid diam:    3.60 cm RV S prime:     12.60 cm/s TAPSE (M-mode): 2.4 cm LEFT ATRIUM             Index        RIGHT ATRIUM           Index LA diam:        4.30 cm 1.84 cm/m   RA Area:     19.90 cm LA Vol (A2C):   60.5 ml 25.83 ml/m  RA Volume:   50.10 ml  21.39 ml/m LA Vol (A4C):   77.4 ml 33.04  ml/m LA Biplane Vol: 73.9 ml 31.55 ml/m  AORTIC VALVE                    PULMONIC VALVE AV Area (Vmax):    2.42 cm     PV Vmax:       0.62 m/s AV Area (Vmean):   2.29 cm     PV Peak grad:  1.5 mmHg AV Area (VTI):     2.70 cm AV Vmax:            131.00 cm/s AV Vmean:          85.100 cm/s AV VTI:            0.289 m AV Peak Grad:      6.9 mmHg AV Mean Grad:      3.0 mmHg LVOT Vmax:         101.00 cm/s LVOT Vmean:        61.900 cm/s LVOT VTI:          0.248 m LVOT/AV VTI ratio: 0.86 AI PHT:            784 msec  AORTA Ao Root diam: 4.10 cm Ao Asc diam:  4.40 cm MITRAL VALVE               TRICUSPID VALVE MV Area (PHT): 3.37 cm    TR Peak grad:   30.0 mmHg MV Area VTI:   2.75 cm    TR Vmax:        274.00 cm/s MV Peak grad:  2.6 mmHg MV Mean grad:  1.0 mmHg    SHUNTS MV Vmax:       0.80 m/s    Systemic VTI:  0.25 m MV Vmean:      45.7 cm/s   Systemic Diam: 2.00 cm MV Decel Time: 225 msec MV E velocity: 49.30 cm/s MV A velocity: 71.10 cm/s MV E/A ratio:  0.69 Gwyndolyn Kaufman MD Electronically signed by Gwyndolyn Kaufman MD Signature Date/Time: 05/06/2021/12:42:41 PM    Final      Discharge Exam: Vitals:   05/06/21 0342 05/06/21 1239  BP: (!) 81/43 (!) 101/59  Pulse: (!) 58 70  Resp:  18  Temp: 98.2 F (36.8 C) 97.9 F (36.6 C)  SpO2: 92% 92%   Vitals:   05/05/21 1931 05/05/21 2324 05/06/21 0342 05/06/21 1239  BP: 95/67 (!) 88/62 (!) 81/43 (!) 101/59  Pulse: 60 (!) 51 (!) 58 70  Resp: 16 16  18   Temp: 97.9 F (36.6 C) 98.6 F (37 C) 98.2 F (36.8 C) 97.9 F (36.6 C)  TempSrc: Oral Oral Oral Oral  SpO2: 96% 95% 92% 92%  Weight:      Height:        General: Pt is alert, awake, not in acute distress Cardiovascular: RRR, S1/S2 +, no rubs, no gallops Respiratory: CTA bilaterally, no wheezing, no rhonchi Abdominal: Soft, NT, ND, bowel sounds + Extremities: no edema, no cyanosis    The results of significant diagnostics from this hospitalization (including imaging, microbiology, ancillary and laboratory) are listed below for reference.     Microbiology: Recent Results (from the past 240 hour(s))  Resp Panel by RT-PCR (Flu A&B, Covid) Nasopharyngeal Swab     Status: None   Collection Time: 05/05/21  2:17 PM   Specimen:  Nasopharyngeal Swab; Nasopharyngeal(NP) swabs in vial transport medium  Result Value Ref Range Status   SARS Coronavirus 2 by RT PCR NEGATIVE NEGATIVE Final  Comment: (NOTE) SARS-CoV-2 target nucleic acids are NOT DETECTED.  The SARS-CoV-2 RNA is generally detectable in upper respiratory specimens during the acute phase of infection. The lowest concentration of SARS-CoV-2 viral copies this assay can detect is 138 copies/mL. A negative result does not preclude SARS-Cov-2 infection and should not be used as the sole basis for treatment or other patient management decisions. A negative result may occur with  improper specimen collection/handling, submission of specimen other than nasopharyngeal swab, presence of viral mutation(s) within the areas targeted by this assay, and inadequate number of viral copies(<138 copies/mL). A negative result must be combined with clinical observations, patient history, and epidemiological information. The expected result is Negative.  Fact Sheet for Patients:  EntrepreneurPulse.com.au  Fact Sheet for Healthcare Providers:  IncredibleEmployment.be  This test is no t yet approved or cleared by the [place] FDA and  has been authorized for detection and/or diagnosis of SARS-CoV-2 by FDA under an Emergency Use Authorization (EUA). This EUA will remain  in effect (meaning this test can be used) for the duration of the COVID-19 declaration under Section 564(b)(1) of the Act, 21 U.S.C.section 360bbb-3(b)(1), unless the authorization is terminated  or revoked sooner.       Influenza A by PCR NEGATIVE NEGATIVE Final   Influenza B by PCR NEGATIVE NEGATIVE Final    Comment: (NOTE) The Xpert Xpress SARS-CoV-2/FLU/RSV plus assay is intended as an aid in the diagnosis of influenza from Nasopharyngeal swab specimens and should not be used as a sole basis for treatment. Nasal washings and aspirates are unacceptable for  Xpert Xpress SARS-CoV-2/FLU/RSV testing.  Fact Sheet for Patients: EntrepreneurPulse.com.au  Fact Sheet for Healthcare Providers: IncredibleEmployment.be  This test is not yet approved or cleared by the [place] FDA and has been authorized for detection and/or diagnosis of SARS-CoV-2 by FDA under an Emergency Use Authorization (EUA). This EUA will remain in effect (meaning this test can be used) for the duration of the COVID-19 declaration under Section 564(b)(1) of the Act, 21 U.S.C. section 360bbb-3(b)(1), unless the authorization is terminated or revoked.  Performed at [facility], [address], [place], [place] [address]      Labs: BNP (last 3 results) No results for input(s): BNP in the last 8760 hours. Basic Metabolic Panel: Recent Labs  Lab 05/05/21 1024 05/05/21 1029 05/06/21 0406  NA 141  --  138  K 4.1  --  3.7  CL 104  --  105  CO2 29  --  26  GLUCOSE 94  --  98  BUN 21  --  21  CREATININE 1.48*  --  1.49*  CALCIUM 9.3  --  8.8*  MG  --  2.1 2.1   Liver Function Tests: No results for input(s): AST, ALT, ALKPHOS, BILITOT, PROT, ALBUMIN in the last 168 hours. Recent Labs  Lab 05/05/21 1024  LIPASE 32   No results for input(s): AMMONIA in the last 168 hours. CBC: Recent Labs  Lab 05/05/21 1024 05/05/21 2115 05/06/21 0406  WBC 5.9 5.1 4.8  HGB 15.2 14.0 13.9  HCT 45.5 42.1 41.0  MCV 96.6 97.9 96.2  PLT 71* 67* 67*   Cardiac Enzymes: No results for input(s): CKTOTAL, CKMB, CKMBINDEX, TROPONINI in the last 168 hours. BNP: Invalid input(s): POCBNP CBG: Recent Labs  Lab 05/05/21 1059  GLUCAP 80   D-Dimer No results for input(s): DDIMER in the last 72 hours. Hgb A1c No results for input(s): HGBA1C in the last 72 hours. Lipid Profile No  results for input(s): CHOL, HDL, LDLCALC, TRIG, CHOLHDL, LDLDIRECT in the last 72 hours. Thyroid function studies No results for input(s): TSH, T4TOTAL, T3FREE,  THYROIDAB in the last 72 hours.  Invalid input(s): FREET3 Anemia work up No results for input(s): VITAMINB12, FOLATE, FERRITIN, TIBC, IRON, RETICCTPCT in the last 72 hours. Urinalysis    Component Value Date/Time   COLORURINE YELLOW 05/22/2016 1522   APPEARANCEUR CLEAR 05/22/2016 1522   LABSPEC 1.015 05/22/2016 1522   PHURINE 8.0 05/22/2016 1522   GLUCOSEU NEGATIVE 05/22/2016 1522   HGBUR NEGATIVE 05/22/2016 1522   BILIRUBINUR NEGATIVE 05/22/2016 1522   KETONESUR NEGATIVE 05/22/2016 1522   PROTEINUR NEGATIVE 05/22/2016 1522   UROBILINOGEN 0.2 07/16/2012 1829   NITRITE NEGATIVE 05/22/2016 1522   LEUKOCYTESUR NEGATIVE 05/22/2016 1522   Sepsis Labs Invalid input(s): PROCALCITONIN,  WBC,  LACTICIDVEN Microbiology Recent Results (from the past 240 hour(s))  Resp Panel by RT-PCR (Flu A&B, Covid) Nasopharyngeal Swab     Status: None   Collection Time: 05/05/21  2:17 PM   Specimen: Nasopharyngeal Swab; Nasopharyngeal(NP) swabs in vial transport medium  Result Value Ref Range Status   SARS Coronavirus 2 by RT PCR NEGATIVE NEGATIVE Final    Comment: (NOTE) SARS-CoV-2 target nucleic acids are NOT DETECTED.  The SARS-CoV-2 RNA is generally detectable in upper respiratory specimens during the acute phase of infection. The lowest concentration of SARS-CoV-2 viral copies this assay can detect is 138 copies/mL. A negative result does not preclude SARS-Cov-2 infection and should not be used as the sole basis for treatment or other patient management decisions. A negative result may occur with  improper specimen collection/handling, submission of specimen other than nasopharyngeal swab, presence of viral mutation(s) within the areas targeted by this assay, and inadequate number of viral copies(<138 copies/mL). A negative result must be combined with clinical observations, patient history, and epidemiological information. The expected result is Negative.  Fact Sheet for Patients:   EntrepreneurPulse.com.au  Fact Sheet for Healthcare Providers:  IncredibleEmployment.be  This test is no t yet approved or cleared by the [place] FDA and  has been authorized for detection and/or diagnosis of SARS-CoV-2 by FDA under an Emergency Use Authorization (EUA). This EUA will remain  in effect (meaning this test can be used) for the duration of the COVID-19 declaration under Section 564(b)(1) of the Act, 21 U.S.C.section 360bbb-3(b)(1), unless the authorization is terminated  or revoked sooner.       Influenza A by PCR NEGATIVE NEGATIVE Final   Influenza B by PCR NEGATIVE NEGATIVE Final    Comment: (NOTE) The Xpert Xpress SARS-CoV-2/FLU/RSV plus assay is intended as an aid in the diagnosis of influenza from Nasopharyngeal swab specimens and should not be used as a sole basis for treatment. Nasal washings and aspirates are unacceptable for Xpert Xpress SARS-CoV-2/FLU/RSV testing.  Fact Sheet for Patients: EntrepreneurPulse.com.au  Fact Sheet for Healthcare Providers: IncredibleEmployment.be  This test is not yet approved or cleared by the [place] FDA and has been authorized for detection and/or diagnosis of SARS-CoV-2 by FDA under an Emergency Use Authorization (EUA). This EUA will remain in effect (meaning this test can be used) for the duration of the COVID-19 declaration under Section 564(b)(1) of the Act, 21 U.S.C. section 360bbb-3(b)(1), unless the authorization is terminated or revoked.  Performed at [facility], [address] [place], [place] [address]      Time coordinating discharge: 35 minutes  SIGNED:   Rodena Goldmann, DO [facility] 05/06/2021, 4:09 PM  If 7PM-7AM,  please contact night-coverage [email or web address]

## 2021-05-06 NOTE — Telephone Encounter (Signed)
Left a message for patient to call office back regarding appt.

## 2021-05-09 ENCOUNTER — Ambulatory Visit (INDEPENDENT_AMBULATORY_CARE_PROVIDER_SITE_OTHER): Payer: Medicare Other | Admitting: Cardiology

## 2021-05-09 ENCOUNTER — Encounter: Payer: Self-pay | Admitting: Cardiology

## 2021-05-09 VITALS — BP 120/72 | HR 78 | Ht 72.0 in | Wt 254.6 lb

## 2021-05-09 DIAGNOSIS — E785 Hyperlipidemia, unspecified: Secondary | ICD-10-CM

## 2021-05-09 DIAGNOSIS — I255 Ischemic cardiomyopathy: Secondary | ICD-10-CM

## 2021-05-09 DIAGNOSIS — I2510 Atherosclerotic heart disease of native coronary artery without angina pectoris: Secondary | ICD-10-CM | POA: Diagnosis not present

## 2021-05-09 DIAGNOSIS — I77819 Aortic ectasia, unspecified site: Secondary | ICD-10-CM

## 2021-05-09 DIAGNOSIS — I10 Essential (primary) hypertension: Secondary | ICD-10-CM

## 2021-05-09 NOTE — Progress Notes (Signed)
Cardiology Office Note:    Date:  05/12/2021   ID:  [name], DOB 12-Mar-1951, MRN [id number]  PCP:  Redmond School, MD  Cardiologist:  Donato Heinz, MD  Electrophysiologist:  None   Referring MD: Rodena Goldmann, DO   Chief Complaint  Patient presents with   Cardiomyopathy    History of Present Illness:    [name] is a 70 y.o. male with a hx of lymphoma, thrombocytopenia, hypertension, CKD stage III AAA, CAD, hyperlipidemia who is referred for evaluation of cardiomyopathy.  He was recently admitted from 12/4 through 05/06/2021 with chest pain.  He presented with acute hypoxic respiratory failure assessed chest pain and was found to have right-sided PE with small pulmonary infarct.  He was started on Xarelto.  Echocardiogram showed EF 50 to 55%, basal inferior hypokinesis, moderate concentric LVH, grade 1 diastolic dysfunction, normal RV function, mild AI, dilatation of aortic root measuring 41 mm, dilatation of ascending aorta measuring 44 mm.  He currently follows with Dr. Otho Perl in cardiology in [facility].  He had stent to RCA 02/2019.  Echocardiogram at that time showed EF 45 to 50%.  Nuclear stress test 01/31/2020 showed fixed inferolateral defect, mild inferior hypokinesis, EF 47%.  He reports that when he was diagnosed with his PE recently, was having chest pain on right side of his chest that was worse with coughing or deep breathing.  Reports that pain is resolving since started treatment for his PE.     Past Medical History:  Diagnosis Date   Anxiety    patient denies   Aortic aneurysm ([place])    ascending   Arthritis    Barrett esophagus    Blood transfusion    Cancer ([place]) 2007   melanoma stage 3 stomach bx   Cancer (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   Coronary artery disease    mild by 09/08/13 cath [facility]   GERD (gastroesophageal reflux disease)    barretts esophagus   H/O hiatal hernia    Headache(784.0)    Hypercholesteremia     Hypertension    PONV (postoperative nausea and vomiting)    trouble breathing after last surgery (at [facility])   Shingles    10   Sleep apnea    not tested   Tumor liver    no problems    Past Surgical History:  Procedure Laterality Date   ABDOMINAL SURGERY     x3   back surgeries     x 9   BACK SURGERY     x8   BIOPSY N/A 03/28/2013   Procedure: Distal Esophageal Biopsy;  Surgeon: Rogene Houston, MD;  Location: [facility];  Service: Endoscopy;  Laterality: N/A;   BIOPSY  05/30/2016   Procedure: BIOPSY;  Surgeon: Rogene Houston, MD;  Location: [facility];  Service: Endoscopy;;   BIOPSY  02/19/2018   Procedure: BIOPSY;  Surgeon: Rogene Houston, MD;  [facility];  Service: Endoscopy;;  Barret's esophagus   BIOPSY  08/29/2020   Procedure: BIOPSY;  Surgeon: Rogene Houston, MD;  [facility];  Service: Endoscopy;;   CARDIAC CATHETERIZATION  2020   CHEST SURGERY     lung incisional hernia for hernia   COLONOSCOPY WITH PROPOFOL N/A 02/19/2018   Procedure: COLONOSCOPY WITH PROPOFOL;  Surgeon: Rogene Houston, MD;  [facility];  Service: Endoscopy;  Laterality: N/A;  1:00   CORONARY ANGIOPLASTY WITH  STENT PLACEMENT  2020   2 blockages: 1 stent   ESOPHAGOGASTRODUODENOSCOPY (EGD) WITH PROPOFOL N/A 03/28/2013   Procedure: ESOPHAGOGASTRODUODENOSCOPY (EGD) WITH PROPOFOL;  Surgeon: Rogene Houston, MD;  Location: [facility];  Service: Endoscopy;  Laterality: N/A;  GE junction @ 38, proximal  margin @37    ESOPHAGOGASTRODUODENOSCOPY (EGD) WITH PROPOFOL N/A 05/30/2016   Procedure: ESOPHAGOGASTRODUODENOSCOPY (EGD) WITH PROPOFOL;  Surgeon: Rogene Houston, MD;  [facility];  Service: Endoscopy;  Laterality: N/A;  955   ESOPHAGOGASTRODUODENOSCOPY (EGD) WITH PROPOFOL N/A 02/19/2018   Procedure: ESOPHAGOGASTRODUODENOSCOPY (EGD) WITH PROPOFOL;  Surgeon: Rogene Houston, MD;  [facility];  Service: Endoscopy;  Laterality: N/A;    ESOPHAGOGASTRODUODENOSCOPY (EGD) WITH PROPOFOL N/A 08/29/2020   Procedure: ESOPHAGOGASTRODUODENOSCOPY (EGD) WITH PROPOFOL;  Surgeon: Rogene Houston, MD;  [facility];  Service: Endoscopy;  Laterality: N/A;  AM   HEMORRHOID SURGERY     x2   HERNIA REPAIR     HIATAL HERNIA REPAIR     JOINT REPLACEMENT     lft knee   KNEE ARTHROPLASTY     lft   KNEE ARTHROSCOPY Right    LEG SURGERY     x4 crushed    LITHOTRIPSY     MELANOMA EXCISION     lft abdomen   NECK SURGERY  2012   POLYPECTOMY  02/19/2018   Procedure: POLYPECTOMY;  Surgeon: Rogene Houston, MD;  [facility];  Service: Endoscopy;;  proximal transverse colon (CB x1), descending colon (CBx3)   POSTERIOR CERVICAL FUSION/FORAMINOTOMY  08/18/2011   Procedure: POSTERIOR CERVICAL FUSION/FORAMINOTOMY LEVEL 3;  Surgeon: Ophelia Charter, MD;  Location: [facility];  Service: Neurosurgery;  Laterality: N/A;  Cervical three-four, four-five, five-six posterior cervical  fusion with instrumentation; Right cervical three-four, four-five laminotomy   REVISION TOTAL HIP ARTHROPLASTY Left    SHOULDER ARTHROSCOPY     rt bith defect , spur, rotator cuff   SPINAL CORD STIMULATOR IMPLANT     rt hip   SPINAL CORD STIMULATOR INSERTION N/A 11/03/2016   Procedure: LUMBAR SPINAL CORD STIMULATOR REVISION;  Surgeon: Newman Pies, MD;  Location: [place];  Service: Neurosurgery;  Laterality: N/A;   THYROID LOBECTOMY     rt    Current Medications: Current Meds  Medication Sig   Aspirin-Acetaminophen-Caffeine (GOODY HEADACHE PO) Take 1-2 packets by mouth daily as needed (headaches).   Bioflavonoid Products (ESTER-C) 500-550 MG TABS Take 1 tablet by mouth daily.   calcium-vitamin D (OSCAL WITH D) 500-200 MG-UNIT per tablet Take 1 tablet by mouth 2 (two) times daily.    cyclobenzaprine (FLEXERIL) 10 MG tablet Take 1 tablet (10 mg total) by mouth 3 (three) times daily as needed for muscle spasms.   dexlansoprazole (DEXILANT) 60 MG  capsule Take 60 mg by mouth daily.    diazepam (VALIUM) 10 MG tablet Take 10 mg by mouth 4 (four) times daily as needed (for back pain).    fexofenadine (ALLEGRA) 180 MG tablet Take 180 mg by mouth every evening.   fish oil-omega-3 fatty acids 1000 MG capsule Take 1 g by mouth 2 (two) times daily.   hydrocortisone (CORTEF) 10 MG tablet Take 10 mg by mouth See admin instructions. 10 mg every evening and 20 mg every morning   isosorbide mononitrate (IMDUR) 30 MG 24 hr tablet Take 30 mg by mouth 2 (two) times daily.   lisinopril (PRINIVIL,ZESTRIL) 10 MG tablet Take 10 mg by mouth daily.   metoCLOPramide (REGLAN) 10 MG  tablet Take 1 tablet by mouth 2 (two) times daily.   montelukast (SINGULAIR) 10 MG tablet Take 10 mg by mouth daily.    Multiple Vitamin (MULITIVITAMIN WITH MINERALS) TABS Take 1 tablet by mouth daily.   ondansetron (ZOFRAN) 8 MG tablet Take by mouth.   oxyCODONE-acetaminophen (PERCOCET) 10-325 MG tablet Take 1 tablet by mouth every 6 (six) hours as needed for pain.   ranolazine (RANEXA) 500 MG 12 hr tablet Take 500 mg by mouth 2 (two) times daily.   [START ON 05/29/2021] rivaroxaban (XARELTO) 20 MG TABS tablet Take 1 tablet (20 mg total) by mouth daily with supper.   RIVAROXABAN (XARELTO) VTE STARTER PACK (15 & 20 MG) Follow package directions: Take one 15mg  tablet by mouth twice a day. On day 22, switch to one 20mg  tablet once a day. Take with food.   rosuvastatin (CRESTOR) 10 MG tablet Take 10 mg by mouth every evening.    sildenafil (VIAGRA) 100 MG tablet Take 50 mg by mouth as needed for erectile dysfunction.     Allergies:   Demerol   Social History   Socioeconomic History   Marital status: Married    Spouse name: Not on file   Number of children: Not on file   Years of education: Not on file   Highest education level: Not on file  Occupational History   Occupation: retired  Tobacco Use   Smoking status: Former    Packs/day: 4.00    Years: 10.00    Pack years:  40.00    Types: Cigarettes    Quit date: 08/12/1980    Years since quitting: 40.7   Smokeless tobacco: Never   Tobacco comments:    occ wine  Vaping Use   Vaping Use: Never used  Substance and Sexual Activity   Alcohol use: Not Currently    Comment: rarely   Drug use: No   Sexual activity: Yes    Birth control/protection: None  Other Topics Concern   Not on file  Social History Narrative   Not on file   Social Determinants of Health   Financial Resource Strain: Not on file  Food Insecurity: Not on file  Transportation Needs: Not on file  Physical Activity: Not on file  Stress: Not on file  Social Connections: Not on file     Family History: The patient's family history includes Aneurysm in his maternal grandmother, mother, sister, and another family member; Heart disease in his brother, father, maternal grandfather, paternal grandfather, and paternal grandmother; Lymphoma in his brother; Skin cancer in an other family member.  ROS:   Please see the history of present illness.     All other systems reviewed and are negative.  EKGs/Labs/Other Studies Reviewed:    The following studies were reviewed today:   EKG:  EKG is  ordered today.  The ekg ordered today demonstrates sinus rhythm with PVCs, left axis deviation, LVH  Recent Labs: 06/07/2020: ALT 29 05/06/2021: BUN 21; Creatinine, Ser 1.49; Hemoglobin 13.9; Magnesium 2.1; Platelets 67; Potassium 3.7; Sodium 138  Recent Lipid Panel No results found for: CHOL, TRIG, HDL, CHOLHDL, VLDL, LDLCALC, LDLDIRECT  Physical Exam:    VS:  BP 120/72   Pulse 78   Ht 6' (1.829 m)   Wt 254 lb 9.6 oz (115.5 kg)   SpO2 95%   BMI 34.53 kg/m     Wt Readings from Last 3 Encounters:  05/09/21 254 lb 9.6 oz (115.5 kg)  05/05/21 250 lb (113.4 kg)  12/19/20 246 lb 9.6 oz (111.9 kg)     GEN:  Well nourished, well developed in no acute distress HEENT: Normal NECK: No JVD; No carotid bruits LYMPHATICS: No lymphadenopathy CARDIAC:  RRR, no murmurs, rubs, gallops RESPIRATORY:  Clear to auscultation without rales, wheezing or rhonchi  ABDOMEN: Soft, non-tender, non-distended MUSCULOSKELETAL:  No edema; No deformity  SKIN: Warm and dry NEUROLOGIC:  Alert and oriented x 3 PSYCHIATRIC:  Normal affect   ASSESSMENT:    1. Coronary artery disease involving native coronary artery of native heart without angina pectoris   2. Ischemic cardiomyopathy   3. Essential hypertension   4. Hyperlipidemia, unspecified hyperlipidemia type   5. Aortic dilatation (HCC)    PLAN:     CAD: Status post PCI to RCA in 02/2019.  Denies any current anginal symptoms. -Continue Xarelto -Continue rosuvastatin 10 mg daily  Ischemic cardiomyopathy: EF 45 to 50% on echo in 2020 at time of his PCI.  Recent admission showed EF improved to 50 to 55% with basal inferior hypokinesis.  PE: Diagnosed during recent admission, tolerating Xarelto.  Denies any bleeding issues.  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  Hypertension: Continue lisinopril, Imdur.  Appears controlled.  Hyperlipidemia: Continue rosuvastatin 10 mg daily  Aortic dilatation: 44 mm ascending aorta on recent echocardiogram.  Being followed by his cardiologist in [facility], planning CTA chest at next clinic visit.   RTC as needed.  He would like to continue to follow with his cardiologist in [facility]     Medication Adjustments/Labs and Tests Ordered: Current medicines are reviewed at length with the patient today.  Concerns regarding medicines are outlined above.  Orders Placed This Encounter  Procedures   EKG 12-Lead   No orders of the defined types were placed in this encounter.   Patient Instructions  Medication Instructions:   Your physician recommends that you continue on your current medications as directed. Please refer to the Current Medication list given to you  today.   Labwork: None   Testing/Procedures: None     Follow-Up: As needed    Thank you for choosing [facility] !        Signed, Donato Heinz, MD  05/12/2021 10:36 PM    [place]

## 2021-05-09 NOTE — Patient Instructions (Signed)
Medication Instructions:   Your physician recommends that you continue on your current medications as directed. Please refer to the Current Medication list given to you today.   Labwork: None   Testing/Procedures: None     Follow-Up: As needed    Thank you for choosing [facility] !

## 2021-06-19 ENCOUNTER — Inpatient Hospital Stay (HOSPITAL_COMMUNITY): Payer: Medicare Other | Attending: Hematology

## 2021-06-19 DIAGNOSIS — Z885 Allergy status to narcotic agent status: Secondary | ICD-10-CM | POA: Diagnosis not present

## 2021-06-19 DIAGNOSIS — M549 Dorsalgia, unspecified: Secondary | ICD-10-CM | POA: Diagnosis not present

## 2021-06-19 DIAGNOSIS — G479 Sleep disorder, unspecified: Secondary | ICD-10-CM | POA: Insufficient documentation

## 2021-06-19 DIAGNOSIS — Z79899 Other long term (current) drug therapy: Secondary | ICD-10-CM | POA: Diagnosis not present

## 2021-06-19 DIAGNOSIS — Z7901 Long term (current) use of anticoagulants: Secondary | ICD-10-CM | POA: Diagnosis not present

## 2021-06-19 DIAGNOSIS — I2699 Other pulmonary embolism without acute cor pulmonale: Secondary | ICD-10-CM | POA: Diagnosis not present

## 2021-06-19 DIAGNOSIS — Z8719 Personal history of other diseases of the digestive system: Secondary | ICD-10-CM | POA: Diagnosis not present

## 2021-06-19 DIAGNOSIS — K5900 Constipation, unspecified: Secondary | ICD-10-CM | POA: Insufficient documentation

## 2021-06-19 DIAGNOSIS — C8307 Small cell B-cell lymphoma, spleen: Secondary | ICD-10-CM

## 2021-06-19 DIAGNOSIS — R109 Unspecified abdominal pain: Secondary | ICD-10-CM | POA: Insufficient documentation

## 2021-06-19 DIAGNOSIS — Z8249 Family history of ischemic heart disease and other diseases of the circulatory system: Secondary | ICD-10-CM | POA: Diagnosis not present

## 2021-06-19 DIAGNOSIS — R059 Cough, unspecified: Secondary | ICD-10-CM | POA: Insufficient documentation

## 2021-06-19 DIAGNOSIS — R0602 Shortness of breath: Secondary | ICD-10-CM | POA: Insufficient documentation

## 2021-06-19 DIAGNOSIS — Z87891 Personal history of nicotine dependence: Secondary | ICD-10-CM | POA: Insufficient documentation

## 2021-06-19 DIAGNOSIS — Z808 Family history of malignant neoplasm of other organs or systems: Secondary | ICD-10-CM | POA: Diagnosis not present

## 2021-06-19 DIAGNOSIS — I2510 Atherosclerotic heart disease of native coronary artery without angina pectoris: Secondary | ICD-10-CM | POA: Insufficient documentation

## 2021-06-19 DIAGNOSIS — K219 Gastro-esophageal reflux disease without esophagitis: Secondary | ICD-10-CM | POA: Insufficient documentation

## 2021-06-19 DIAGNOSIS — R519 Headache, unspecified: Secondary | ICD-10-CM | POA: Insufficient documentation

## 2021-06-19 DIAGNOSIS — Z85820 Personal history of malignant melanoma of skin: Secondary | ICD-10-CM | POA: Insufficient documentation

## 2021-06-19 DIAGNOSIS — R161 Splenomegaly, not elsewhere classified: Secondary | ICD-10-CM | POA: Diagnosis not present

## 2021-06-19 DIAGNOSIS — D696 Thrombocytopenia, unspecified: Secondary | ICD-10-CM

## 2021-06-19 DIAGNOSIS — D709 Neutropenia, unspecified: Secondary | ICD-10-CM

## 2021-06-19 LAB — CBC WITH DIFFERENTIAL/PLATELET
Abs Immature Granulocytes: 0.02 10*3/uL (ref 0.00–0.07)
Basophils Absolute: 0 10*3/uL (ref 0.0–0.1)
Basophils Relative: 0 %
Eosinophils Absolute: 0.1 10*3/uL (ref 0.0–0.5)
Eosinophils Relative: 2 %
HCT: 45.8 % (ref 39.0–52.0)
Hemoglobin: 15.5 g/dL (ref 13.0–17.0)
Immature Granulocytes: 1 %
Lymphocytes Relative: 21 %
Lymphs Abs: 0.9 10*3/uL (ref 0.7–4.0)
MCH: 32.8 pg (ref 26.0–34.0)
MCHC: 33.8 g/dL (ref 30.0–36.0)
MCV: 96.8 fL (ref 80.0–100.0)
Monocytes Absolute: 0.5 10*3/uL (ref 0.1–1.0)
Monocytes Relative: 13 %
Neutro Abs: 2.6 10*3/uL (ref 1.7–7.7)
Neutrophils Relative %: 63 %
Platelets: 94 10*3/uL — ABNORMAL LOW (ref 150–400)
RBC: 4.73 MIL/uL (ref 4.22–5.81)
RDW: 12.7 % (ref 11.5–15.5)
WBC: 4.2 10*3/uL (ref 4.0–10.5)
nRBC: 0 % (ref 0.0–0.2)

## 2021-06-19 LAB — COMPREHENSIVE METABOLIC PANEL
ALT: 18 U/L (ref 0–44)
AST: 22 U/L (ref 15–41)
Albumin: 4.4 g/dL (ref 3.5–5.0)
Alkaline Phosphatase: 44 U/L (ref 38–126)
Anion gap: 10 (ref 5–15)
BUN: 18 mg/dL (ref 8–23)
CO2: 26 mmol/L (ref 22–32)
Calcium: 9.1 mg/dL (ref 8.9–10.3)
Chloride: 104 mmol/L (ref 98–111)
Creatinine, Ser: 1.36 mg/dL — ABNORMAL HIGH (ref 0.61–1.24)
GFR, Estimated: 56 mL/min — ABNORMAL LOW (ref >60)
Glucose, Bld: 91 mg/dL (ref 70–99)
Potassium: 3.9 mmol/L (ref 3.5–5.1)
Sodium: 140 mmol/L (ref 135–145)
Total Bilirubin: 1.2 mg/dL (ref 0.3–1.2)
Total Protein: 6.9 g/dL (ref 6.5–8.1)

## 2021-06-19 LAB — LACTATE DEHYDROGENASE: LDH: 122 U/L (ref 98–192)

## 2021-06-25 ENCOUNTER — Other Ambulatory Visit: Payer: Self-pay

## 2021-06-25 ENCOUNTER — Ambulatory Visit (INDEPENDENT_AMBULATORY_CARE_PROVIDER_SITE_OTHER): Payer: Medicare Other | Admitting: Internal Medicine

## 2021-06-25 ENCOUNTER — Encounter (INDEPENDENT_AMBULATORY_CARE_PROVIDER_SITE_OTHER): Payer: Self-pay | Admitting: Internal Medicine

## 2021-06-25 VITALS — BP 132/82 | Temp 98.3°F | Ht 72.0 in | Wt 253.5 lb

## 2021-06-25 DIAGNOSIS — K219 Gastro-esophageal reflux disease without esophagitis: Secondary | ICD-10-CM

## 2021-06-25 DIAGNOSIS — K5900 Constipation, unspecified: Secondary | ICD-10-CM

## 2021-06-25 DIAGNOSIS — K3184 Gastroparesis: Secondary | ICD-10-CM | POA: Diagnosis not present

## 2021-06-25 MED ORDER — POLYETHYLENE GLYCOL 3350 17 GM/SCOOP PO POWD
17.0000 g | Freq: Every day | ORAL | 0 refills | Status: DC
Start: 2021-06-25 — End: 2022-02-14

## 2021-06-25 NOTE — Progress Notes (Signed)
Presenting complaint;  Follow for chronic GERD and gastroparesis. Patient complains of constipation.  Database and subjective:  Patient is 71 year old Caucasian male with history of GERD 3 failed antireflux therapies, short segment Barrett's esophagus gastroparesis constipation history of colonic adenomas who is here for scheduled visit.  He was last seen in July 2022.  He is accompanied by his wife. He says heartburn is well controlled with dexlansoprazole.  He denies dysphagia chronic cough or sore throat.  He has occasional nausea but no vomiting or regurgitation.  He is not having any side effects with metoclopramide.  His wife states he sleeps too much.  He is on Valium 10 mg twice daily.  He states he was diagnosed with pulmonary embolism on May 05, 2021 when he suddenly developed right-sided chest pain.  He is not on anticoagulant.  He says his 3 other brothers to a former living also needed anticoagulant.  There is no family history of clotting disorder.  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 Is his appetite is normal.  He has not lost any weight since his last visit.  He says bowels move every 3 days.  He has taken MiraLAX in the past but it did not work.  He denies melena or rectal bleeding. He states he needs ondansetron occasionally.  Last dose was about a month ago.  Current Medications: Outpatient Encounter Medications as of 06/25/2021  Medication Sig   Aspirin-Acetaminophen-Caffeine (GOODY HEADACHE PO) Take 1-2 packets by mouth daily as needed (headaches).   Bioflavonoid Products (ESTER-C) 500-550 MG TABS Take 1 tablet by mouth daily.   calcium-vitamin D (OSCAL WITH D) 500-200 MG-UNIT per tablet Take 1 tablet by mouth 2 (two) times daily.    cyclobenzaprine (FLEXERIL) 10 MG tablet Take 1 tablet (10 mg total) by mouth 3 (three) times daily as needed for muscle spasms.   dexlansoprazole (DEXILANT)  60 MG capsule Take 60 mg by mouth daily.    diazepam (VALIUM) 10 MG tablet Take 10 mg by mouth 4 (four) times daily as needed (for back pain).    fexofenadine (ALLEGRA) 180 MG tablet Take 180 mg by mouth every evening.   fish oil-omega-3 fatty acids 1000 MG capsule Take 1 g by mouth 2 (two) times daily.   hydrocortisone (CORTEF) 10 MG tablet Take 10 mg by mouth See admin instructions. 10 mg every evening and 20 mg every morning   isosorbide mononitrate (IMDUR) 30 MG 24 hr tablet Take 30 mg by mouth 2 (two) times daily.   lisinopril (PRINIVIL,ZESTRIL) 10 MG tablet Take 10 mg by mouth daily.   metoCLOPramide (REGLAN) 10 MG tablet Take 1 tablet by mouth 2 (two) times daily.   montelukast (SINGULAIR) 10 MG tablet Take 10 mg by mouth daily.    Multiple Vitamin (MULITIVITAMIN WITH MINERALS) TABS Take 1 tablet by mouth daily.   ondansetron (ZOFRAN) 8 MG tablet Take by mouth.   oxyCODONE-acetaminophen (PERCOCET) 10-325 MG tablet Take 1 tablet by mouth every 6 (six) hours as needed for pain.   ranolazine (RANEXA) 500 MG 12 hr tablet Take 500 mg by mouth 2 (two) times daily.   rivaroxaban (XARELTO) 20 MG TABS tablet Take 1 tablet (20 mg total) by mouth daily with supper.   rosuvastatin (CRESTOR) 10 MG tablet Take 10 mg by mouth every evening.    sildenafil (VIAGRA) 100 MG tablet Take 50 mg by mouth as needed for erectile dysfunction.   [DISCONTINUED] RIVAROXABAN (  XARELTO) VTE STARTER PACK (15 & 20 MG) Follow package directions: Take one 63m tablet by mouth twice a day. On day 22, switch to one 227mtablet once a day. Take with food.   No facility-administered encounter medications on file as of 06/25/2021.     Objective: Blood pressure 132/82, temperature 98.3 F (36.8 C), temperature source Oral, height 6' (1.829 m), weight 253 lb 8 oz (115 kg). Patient is alert and in no acute distress. Conjunctiva is pink. Sclera is nonicteric Oropharyngeal mucosa is normal. No neck masses or thyromegaly  noted. Cardiac exam with regular rhythm normal S1 and S2. No murmur or gallop noted. Lungs are clear to auscultation. Abdomen is full.  He has upper midline scar.  On palpation abdomen is soft and nontender with organomegaly or masses. No LE edema or clubbing noted.  Labs/studies Results:   CBC Latest Ref Rng & Units 06/19/2021 05/06/2021 05/05/2021  WBC 4.0 - 10.5 K/uL 4.2 4.8 5.1  Hemoglobin 13.0 - 17.0 g/dL 15.5 13.9 14.0  Hematocrit 39.0 - 52.0 % 45.8 41.0 42.1  Platelets 150 - 400 K/uL 94(L) 67(L) 67(L)    CMP Latest Ref Rng & Units 06/19/2021 05/06/2021 05/05/2021  Glucose 70 - 99 mg/dL 91 98 94  BUN 8 - 23 mg/dL 18 21 21   Creatinine 0.61 - 1.24 mg/dL 1.36(H) 1.49(H) 1.48(H)  Sodium 135 - 145 mmol/L 140 138 141  Potassium 3.5 - 5.1 mmol/L 3.9 3.7 4.1  Chloride 98 - 111 mmol/L 104 105 104  CO2 22 - 32 mmol/L 26 26 29   Calcium 8.9 - 10.3 mg/dL 9.1 8.8(L) 9.3  Total Protein 6.5 - 8.1 g/dL 6.9 - -  Total Bilirubin 0.3 - 1.2 mg/dL 1.2 - -  Alkaline Phos 38 - 126 U/L 44 - -  AST 15 - 41 U/L 22 - -  ALT 0 - 44 U/L 18 - -    Hepatic Function Latest Ref Rng & Units 06/19/2021 06/07/2020 05/22/2020  Total Protein 6.5 - 8.1 g/dL 6.9 8.2(H) 8.0  Albumin 3.5 - 5.0 g/dL 4.4 4.3 3.7  AST 15 - 41 U/L 22 40 61(H)  ALT 0 - 44 U/L 18 29 42  Alk Phosphatase 38 - 126 U/L 44 60 58  Total Bilirubin 0.3 - 1.2 mg/dL 1.2 0.7 1.0      Assessment:  #1.  Chronic GERD.  He is doing well with dietary measures and a PPI.  Dexlansoprazole has provided best symptomatic relief.  He does have history of short segment Barrett's esophagus but EGD in September 2019 and in March 2022 did not reveal any obvious Barrett's mucosa and biopsies were negative.  It is possible that short segment Barrett's esophagus been ablated with biopsies on prior exams.  Therefore no plans for surveillance EGD at this time.  #2.  Gastroparesis.  He is on dietary measures and low-dose metoclopramide which she is tolerating well.  He is  once again reminded of potential side effects.  He can try taking 10 mg daily with second dose on as-needed basis.  #3.  Chronic constipation.  Constipation most likely secondary to his medications.  We will try polyethylene glycol before considering other prescription medications.  #4.  History of colonic polyps.  Last colonoscopy was in September 2019 with removal of 4 small polyps and these were tubular adenomas.  Neck surveillance exam due in September 2024.   Plan:  Polyethylene glycol 17 g p.o. daily. He will try polyethylene glycol for 2 weeks and if  it does not work he will call office. Take metoclopramide 10 mg daily before breakfast and evening meal and second dose on as-needed basis. Continue dexlansoprazole at 60 mg p.o. every morning. Office visit in 6 months.

## 2021-06-25 NOTE — Patient Instructions (Signed)
Notify if Miralax does not help alleviate constipation

## 2021-06-26 ENCOUNTER — Inpatient Hospital Stay (HOSPITAL_BASED_OUTPATIENT_CLINIC_OR_DEPARTMENT_OTHER): Payer: Medicare Other | Admitting: Hematology

## 2021-06-26 VITALS — BP 138/86 | HR 66 | Temp 98.0°F | Resp 18 | Ht 72.0 in | Wt 256.0 lb

## 2021-06-26 DIAGNOSIS — D696 Thrombocytopenia, unspecified: Secondary | ICD-10-CM

## 2021-06-26 DIAGNOSIS — C8307 Small cell B-cell lymphoma, spleen: Secondary | ICD-10-CM | POA: Diagnosis not present

## 2021-06-26 NOTE — Progress Notes (Signed)
[name], [place] [address]   CLINIC:  Medical Oncology/Hematology  PCP:  [facility], [place] / [place] [address]  [phone]  REASON FOR VISIT:  Follow-up for thrombocytopenia  PRIOR THERAPY: Prednisone 60 mg in 05/2020  CURRENT THERAPY: surveillance  INTERVAL HISTORY:  [name], a 71 y.o. male, returns for routine follow-up for his thrombocytopenia. [name] was last seen on 08/16/2020.  Today he reports feeling well. On 05/05/2021 he presented to the ED with DVT and PE. He reports continued fatigue and sleepiness throughout the day. He denies immobilization or long road trips prior to his PE. He denies history of PE and DVT. He reports limited activity following a series of back surgeries. He is currently on Xarelto and is tolerating it well. He reports 1 brother had a history of unprovoked PE, and another brother had blood clots following heart surgery.    REVIEW OF SYSTEMS:  Review of Systems  Constitutional:  Positive for fatigue. Negative for appetite change.  Respiratory:  Positive for cough and shortness of breath.   Gastrointestinal:  Positive for constipation.  Musculoskeletal:  Positive for back pain (6/10) and flank pain (6/10).  Neurological:  Positive for headaches.  Psychiatric/Behavioral:  Positive for sleep disturbance.   All other systems reviewed and are negative.  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   Coronary artery disease    mild by 09/08/13 cath [facility]   GERD (gastroesophageal reflux disease)    barretts esophagus   H/O hiatal hernia    Headache(784.0)    Hypercholesteremia    Hypertension    PONV (postoperative  nausea and vomiting)    trouble breathing after last surgery (at [facility])   Shingles    10   Sleep apnea    not tested   Tumor liver    no problems   Past Surgical History:  Procedure Laterality Date   ABDOMINAL SURGERY     x3   back surgeries     x 9   BACK SURGERY     x8   BIOPSY N/A 03/28/2013   Procedure: Distal Esophageal Biopsy;  Surgeon: Rogene Houston, MD;  Location: [facility];  Service: Endoscopy;  Laterality: N/A;   BIOPSY  05/30/2016   Procedure: BIOPSY;  Surgeon: Rogene Houston, MD;  [facility];  Service: Endoscopy;;   BIOPSY  02/19/2018   Procedure: BIOPSY;  Surgeon: Rogene Houston, MD;  [facility];  Service: Endoscopy;;  Barret's esophagus   BIOPSY  08/29/2020   Procedure: BIOPSY;  Surgeon: Rogene Houston, MD;  [facility];  Service: Endoscopy;;   CARDIAC CATHETERIZATION  2020   CHEST SURGERY     lung incisional hernia for hernia   COLONOSCOPY WITH PROPOFOL N/A 02/19/2018   Procedure: COLONOSCOPY WITH PROPOFOL;  Surgeon: Rogene Houston, MD;  [facility];  Service: Endoscopy;  Laterality: N/A;  1:00   CORONARY ANGIOPLASTY WITH STENT PLACEMENT  2020   2 blockages: 1 stent   ESOPHAGOGASTRODUODENOSCOPY (EGD) WITH PROPOFOL N/A 03/28/2013   Procedure: ESOPHAGOGASTRODUODENOSCOPY (EGD)  WITH PROPOFOL;  Surgeon: Rogene Houston, MD;  Location: [facility];  Service: Endoscopy;  Laterality: N/A;  GE junction @ 38, proximal  margin _0    ESOPHAGOGASTRODUODENOSCOPY (EGD) WITH PROPOFOL N/A 05/30/2016   Procedure: ESOPHAGOGASTRODUODENOSCOPY (EGD) WITH PROPOFOL;  Surgeon: Rogene Houston, MD;  [facility];  Service: Endoscopy;  Laterality: N/A;  955   ESOPHAGOGASTRODUODENOSCOPY (EGD) WITH PROPOFOL N/A 02/19/2018   Procedure: ESOPHAGOGASTRODUODENOSCOPY (EGD) WITH PROPOFOL;  Surgeon: Rogene Houston, MD;  [facility];  Service: Endoscopy;  Laterality: N/A;   ESOPHAGOGASTRODUODENOSCOPY (EGD) WITH PROPOFOL N/A  08/29/2020   Procedure: ESOPHAGOGASTRODUODENOSCOPY (EGD) WITH PROPOFOL;  Surgeon: Rogene Houston, MD;  [facility];  Service: Endoscopy;  Laterality: N/A;  AM   HEMORRHOID SURGERY     x2   HERNIA REPAIR     HIATAL HERNIA REPAIR     JOINT REPLACEMENT     lft knee   KNEE ARTHROPLASTY     lft   KNEE ARTHROSCOPY Right    LEG SURGERY     x4 crushed    LITHOTRIPSY     MELANOMA EXCISION     lft abdomen   NECK SURGERY  2012   POLYPECTOMY  02/19/2018   Procedure: POLYPECTOMY;  Surgeon: Rogene Houston, MD;  [facility];  Service: Endoscopy;;  proximal transverse colon (CB x1), descending colon (CBx3)   POSTERIOR CERVICAL FUSION/FORAMINOTOMY  08/18/2011   Procedure: POSTERIOR CERVICAL FUSION/FORAMINOTOMY LEVEL 3;  Surgeon: Ophelia Charter, MD;  [facility];  Service: Neurosurgery;  Laterality: N/A;  Cervical three-four, four-five, five-six posterior cervical  fusion with instrumentation; Right cervical three-four, four-five laminotomy   REVISION TOTAL HIP ARTHROPLASTY Left    SHOULDER ARTHROSCOPY     rt bith defect , spur, rotator cuff   SPINAL CORD STIMULATOR IMPLANT     rt hip   SPINAL CORD STIMULATOR INSERTION N/A 11/03/2016   Procedure: LUMBAR SPINAL CORD STIMULATOR REVISION;  Surgeon: Newman Pies, MD;  Location: [facility];  Service: Neurosurgery;  Laterality: N/A;   THYROID LOBECTOMY     rt    SOCIAL HISTORY:  Social History   Socioeconomic History   Marital status: Married    Spouse name: Not on file   Number of children: Not on file   Years of education: Not on file   Highest education level: Not on file  Occupational History   Occupation: retired  Tobacco Use   Smoking status: Former    Packs/day: 4.00    Years: 10.00    Pack years: 40.00    Types: Cigarettes    Quit date: 08/12/1980    Years since quitting: 40.8   Smokeless tobacco: Never   Tobacco comments:    occ wine  Vaping Use   Vaping Use: Never used  Substance and Sexual  Activity   Alcohol use: Not Currently   Drug use: No   Sexual activity: Yes    Birth control/protection: None  Other Topics Concern   Not on file  Social History Narrative   Not on file   Social Determinants of Health   Financial Resource Strain: Not on file  Food Insecurity: Not on file  Transportation Needs: Not on file  Physical Activity: Not on file  Stress: Not on file  Social Connections: Not on file  Intimate Partner Violence: Not on file    FAMILY HISTORY:  Family History  Problem Relation Age of Onset   Heart disease Father  Aneurysm Mother    Aneurysm Sister    Heart disease Paternal Grandmother    Heart disease Paternal Grandfather    Heart disease Maternal Grandfather    Heart disease Brother        heart transplant   Aneurysm Other        pt states several members have aortic aneurysm   Skin cancer Other        several members   Lymphoma Brother    Aneurysm Maternal Grandmother     CURRENT MEDICATIONS:  Current Outpatient Medications  Medication Sig Dispense Refill   Aspirin-Acetaminophen-Caffeine (GOODY HEADACHE PO) Take 1-2 packets by mouth daily as needed (headaches).     Bioflavonoid Products (ESTER-C) 500-550 MG TABS Take 1 tablet by mouth daily.     calcium-vitamin D (OSCAL WITH D) 500-200 MG-UNIT per tablet Take 1 tablet by mouth 2 (two) times daily.      cyclobenzaprine (FLEXERIL) 10 MG tablet Take 1 tablet (10 mg total) by mouth 3 (three) times daily as needed for muscle spasms. 50 tablet 1   dexlansoprazole (DEXILANT) 60 MG capsule Take 60 mg by mouth daily.      diazepam (VALIUM) 10 MG tablet Take 10 mg by mouth 4 (four) times daily as needed (for back pain).      fexofenadine (ALLEGRA) 180 MG tablet Take 180 mg by mouth every evening.     fish oil-omega-3 fatty acids 1000 MG capsule Take 1 g by mouth 2 (two) times daily.     hydrocortisone (CORTEF) 10 MG tablet Take 10 mg by mouth See admin instructions. 10 mg every evening and 20 mg every  morning     isosorbide mononitrate (IMDUR) 30 MG 24 hr tablet Take 30 mg by mouth 2 (two) times daily.     lisinopril (PRINIVIL,ZESTRIL) 10 MG tablet Take 10 mg by mouth daily.     metoCLOPramide (REGLAN) 10 MG tablet Take 1 tablet by mouth 2 (two) times daily.     montelukast (SINGULAIR) 10 MG tablet Take 10 mg by mouth daily.      Multiple Vitamin (MULITIVITAMIN WITH MINERALS) TABS Take 1 tablet by mouth daily.     ondansetron (ZOFRAN) 8 MG tablet Take by mouth.     oxyCODONE-acetaminophen (PERCOCET) 10-325 MG tablet Take 1 tablet by mouth every 6 (six) hours as needed for pain.     polyethylene glycol powder (GLYCOLAX/MIRALAX) 17 GM/SCOOP powder Take 17 g by mouth daily. 255 g 0   ranolazine (RANEXA) 500 MG 12 hr tablet Take 500 mg by mouth 2 (two) times daily.     rivaroxaban (XARELTO) 20 MG TABS tablet Take 1 tablet (20 mg total) by mouth daily with supper. 30 tablet 2   rosuvastatin (CRESTOR) 10 MG tablet Take 10 mg by mouth every evening.      sildenafil (VIAGRA) 100 MG tablet Take 50 mg by mouth as needed for erectile dysfunction.     No current facility-administered medications for this visit.    ALLERGIES:  Allergies  Allergen Reactions   Demerol Other (See Comments)    "Makes me crazy."    PHYSICAL EXAM:  Performance status (ECOG): 1 - Symptomatic but completely ambulatory  There were no vitals filed for this visit. Wt Readings from Last 3 Encounters:  06/25/21 253 lb 8 oz (115 kg)  05/09/21 254 lb 9.6 oz (115.5 kg)  05/05/21 250 lb (113.4 kg)   Physical Exam Vitals reviewed.  Constitutional:      Appearance: Normal appearance.  He is obese.  Cardiovascular:     Rate and Rhythm: Normal rate and regular rhythm.     Pulses: Normal pulses.     Heart sounds: Normal heart sounds.  Pulmonary:     Effort: Pulmonary effort is normal.     Breath sounds: Normal breath sounds.  Abdominal:     Palpations: Abdomen is soft. There is no hepatomegaly, splenomegaly or mass.      Tenderness: There is no abdominal tenderness.  Lymphadenopathy:     Cervical: No cervical adenopathy.     Right cervical: No superficial cervical adenopathy.    Left cervical: No superficial cervical adenopathy.     Upper Body:     Right upper body: No supraclavicular, axillary or pectoral adenopathy.     Left upper body: No supraclavicular, axillary or pectoral adenopathy.  Neurological:     General: No focal deficit present.     Mental Status: He is alert and oriented to person, place, and time.  Psychiatric:        Mood and Affect: Mood normal.        Behavior: Behavior normal.    LABORATORY DATA:  I have reviewed the labs as listed.  CBC Latest Ref Rng & Units 06/19/2021 05/06/2021 05/05/2021  WBC 4.0 - 10.5 K/uL 4.2 4.8 5.1  Hemoglobin 13.0 - 17.0 g/dL 15.5 13.9 14.0  Hematocrit 39.0 - 52.0 % 45.8 41.0 42.1  Platelets 150 - 400 K/uL 94(L) 67(L) 67(L)   CMP Latest Ref Rng & Units 06/19/2021 05/06/2021 05/05/2021  Glucose 70 - 99 mg/dL 91 98 94  BUN 8 - 23 mg/dL _0 Creatinine 0.61 - 1.24 mg/dL 1.36(H) 1.49(H) 1.48(H)  Sodium 135 - 145 mmol/L 140 138 141  Potassium 3.5 - 5.1 mmol/L 3.9 3.7 4.1  Chloride 98 - 111 mmol/L 104 105 104  CO2 22 - 32 mmol/L _1 Calcium 8.9 - 10.3 mg/dL 9.1 8.8(L) 9.3  Total Protein 6.5 - 8.1 g/dL 6.9 - -  Total Bilirubin 0.3 - 1.2 mg/dL 1.2 - -  Alkaline Phos 38 - 126 U/L 44 - -  AST 15 - 41 U/L 22 - -  ALT 0 - 44 U/L 18 - -      Component Value Date/Time   RBC 4.73 06/19/2021 0850   MCV 96.8 06/19/2021 0850   MCH 32.8 06/19/2021 0850   MCHC 33.8 06/19/2021 0850   RDW 12.7 06/19/2021 0850   LYMPHSABS 0.9 06/19/2021 0850   MONOABS 0.5 06/19/2021 0850   EOSABS 0.1 06/19/2021 0850   BASOSABS 0.0 06/19/2021 0850    DIAGNOSTIC IMAGING:  I have independently reviewed the scans and discussed with the patient. No results found.   ASSESSMENT:  1.  Moderate thrombocytopenia: -Seen as referral from Dr. Nolon Rod office for decreased  platelet count of 80 on CBC on 10/21/2018. -Ex-smoker, quit in 1982, smoked 1 to 2 packs/day for 10 years.  He also spent 2 years at [facility]. -CT abdomen on 11/08/2018 showed normal size spleen with no abdominal or pelvic pathology. -BMBX on 03/01/2019 showed slightly hypercellular marrow for age with trilineage hematopoiesis including abundant megakaryocytes and predominantly normal morphology.  Overall myeloid changes are nonspecific and not diagnostic for MDS.  Chromosome analysis was normal.  MDS FISH panel was normal.  Core biopsy showed small lymphoid aggregates, features are not considered specific or diagnostic of lymphoproliferative disorder. -He was treated with pulsed dexamethasone around the 01/27/2020 with improvement of platelet count to 70  K.  He could not tolerate dexamethasone due to sleeplessness. -He was also treated with prednisone 60 mg daily on 05/22/2020.  He took it only for 2 to 3 days and stopped it due to inability to sleep. -Treated with immunoglobulin from 05/14/2020 through 05/18/2020. -PET scan on 06/05/2020 view shows no hypermetabolic lymph nodes in the chest, abdomen or pelvis.  Splenomegaly measuring 18 cm with normal metabolic activity. -Bone marrow biopsy on 06/26/2020 with mildly hypercellular marrow, no significant dysplasia.  Scattered large lymphoid aggregates with a predominance of B cells.  Flow cytometry was nondiagnostic.  There is no expression of CD5 or CD10 on the B cells. 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  However no clonality is demonstrated in the kappa light chain gene (IGK) -Myeloma FISH panel was negative.  Cytogenetics was 36, XY. -He was evaluated as second opinion by Dr. 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 - Received 4 cycles of rituximab from 09/05/2020 through 10/03/2020.   2.   Malignant melanoma: -Melanoma resected on the left side of the abdomen with left axillary sentinel lymph node biopsy at [facility] 14 years ago.  Reportedly stage III.   PLAN:  1.  Splenic marginal zone lymphoma: - He does not have any B symptoms.  However he continues to feel tired. - Reviewed his labs today which showed platelet count is 94.  White count is normal at 4.2 with normal differential.  LDH was normal. - Physical examination did not reveal any palpable adenopathy.  No clearly palpable splenomegaly. - He will follow-up with Dr. Caprice Beaver in February.  I will see him back in May of this year.  2.  Unprovoked pulmonary embolism: - CT PE protocol on 05/05/2021 showed segmental pulmonary emboli within the right middle and lower lobe. - Ultrasound Doppler lower extremities on 05/06/2021 was negative. - He has significant family history with a brother who developed pulmonary embolism after heart transplant and another brother who had unprovoked pulmonary embolism on lifelong anticoagulation. - Based on his personal and his family history, I have recommended indefinite anticoagulation.  I do not believe any further testing is necessary.      Orders placed this encounter:  No orders of the defined types were placed in this encounter.    Derek Jack, MD [place] [phone]   I, Thana Ates, am acting as a scribe for Dr. Derek Jack.  I, Derek Jack MD, have reviewed the above documentation for accuracy and completeness, and I agree with the above.

## 2021-06-26 NOTE — Patient Instructions (Addendum)
[place] at [facility] Discharge Instructions  You were seen and examined today by Dr. Delton Coombes. Dr. Delton Coombes reviewed your recent labs, which has improved. Your platelets are now 21, this has increased from 60s range. This is good news!  Dr. Delton Coombes discussed your recent blood clot. Because your blood clot occurred without an identifiable reason as well as your family history, you should remain on Xarelto lifelong.  Please call the clinic if you have any questions.  Follow-up as scheduled.    Thank you for choosing [place] at [facility] to provide your oncology and hematology care.  To afford each patient quality time with our provider, please arrive at least 15 minutes before your scheduled appointment time.   If you have a lab appointment with the [facility] please come in thru the Main Entrance and check in at the main information desk.  You need to re-schedule your appointment should you arrive 10 or more minutes late.  We strive to give you quality time with our providers, and arriving late affects you and other patients whose appointments are after yours.  Also, if you no show three or more times for appointments you may be dismissed from the clinic at the providers discretion.     Again, thank you for choosing [facility].  Our hope is that these requests will decrease the amount of time that you wait before being seen by our physicians.       _____________________________________________________________  Should you have questions after your visit to [facility], please contact our office at [phone] and follow the prompts.  Our office hours are 8:00 a.m. and 4:30 p.m. Monday - Friday.  Please note that voicemails left after 4:00 p.m. may not be returned until the following business day.  We are closed weekends and major holidays.  You do have access to a nurse 24-7, just call the main number to  the clinic [phone] and do not press any options, hold on the line and a nurse will answer the phone.    For prescription refill requests, have your pharmacy contact our office and allow 72 hours.    Due to Covid, you will need to wear a mask upon entering the hospital. If you do not have a mask, a mask will be given to you at the Main Entrance upon arrival. For doctor visits, patients may have 1 support person age 70 or older with them. For treatment visits, patients can not have anyone with them due to social distancing guidelines and our immunocompromised population.

## 2021-10-23 ENCOUNTER — Inpatient Hospital Stay (HOSPITAL_COMMUNITY): Payer: Medicare Other | Attending: Hematology

## 2021-10-23 DIAGNOSIS — R059 Cough, unspecified: Secondary | ICD-10-CM | POA: Diagnosis not present

## 2021-10-23 DIAGNOSIS — K5900 Constipation, unspecified: Secondary | ICD-10-CM | POA: Insufficient documentation

## 2021-10-23 DIAGNOSIS — R5383 Other fatigue: Secondary | ICD-10-CM | POA: Diagnosis not present

## 2021-10-23 DIAGNOSIS — Z79899 Other long term (current) drug therapy: Secondary | ICD-10-CM | POA: Diagnosis not present

## 2021-10-23 DIAGNOSIS — Z85820 Personal history of malignant melanoma of skin: Secondary | ICD-10-CM | POA: Insufficient documentation

## 2021-10-23 DIAGNOSIS — Z8249 Family history of ischemic heart disease and other diseases of the circulatory system: Secondary | ICD-10-CM | POA: Diagnosis not present

## 2021-10-23 DIAGNOSIS — G479 Sleep disorder, unspecified: Secondary | ICD-10-CM | POA: Insufficient documentation

## 2021-10-23 DIAGNOSIS — R0602 Shortness of breath: Secondary | ICD-10-CM | POA: Insufficient documentation

## 2021-10-23 DIAGNOSIS — D696 Thrombocytopenia, unspecified: Secondary | ICD-10-CM | POA: Diagnosis present

## 2021-10-23 DIAGNOSIS — C8307 Small cell B-cell lymphoma, spleen: Secondary | ICD-10-CM | POA: Insufficient documentation

## 2021-10-23 DIAGNOSIS — R519 Headache, unspecified: Secondary | ICD-10-CM | POA: Diagnosis not present

## 2021-10-23 DIAGNOSIS — Z86711 Personal history of pulmonary embolism: Secondary | ICD-10-CM | POA: Diagnosis not present

## 2021-10-23 DIAGNOSIS — R110 Nausea: Secondary | ICD-10-CM | POA: Diagnosis not present

## 2021-10-23 DIAGNOSIS — K219 Gastro-esophageal reflux disease without esophagitis: Secondary | ICD-10-CM | POA: Diagnosis not present

## 2021-10-23 DIAGNOSIS — Z808 Family history of malignant neoplasm of other organs or systems: Secondary | ICD-10-CM | POA: Diagnosis not present

## 2021-10-23 DIAGNOSIS — Z7901 Long term (current) use of anticoagulants: Secondary | ICD-10-CM | POA: Insufficient documentation

## 2021-10-23 DIAGNOSIS — Z8719 Personal history of other diseases of the digestive system: Secondary | ICD-10-CM | POA: Insufficient documentation

## 2021-10-23 DIAGNOSIS — Z885 Allergy status to narcotic agent status: Secondary | ICD-10-CM | POA: Insufficient documentation

## 2021-10-23 LAB — CBC WITH DIFFERENTIAL/PLATELET
Abs Immature Granulocytes: 0.03 10*3/uL (ref 0.00–0.07)
Basophils Absolute: 0 10*3/uL (ref 0.0–0.1)
Basophils Relative: 0 %
Eosinophils Absolute: 0 10*3/uL (ref 0.0–0.5)
Eosinophils Relative: 0 %
HCT: 47.5 % (ref 39.0–52.0)
Hemoglobin: 15.6 g/dL (ref 13.0–17.0)
Immature Granulocytes: 1 %
Lymphocytes Relative: 24 %
Lymphs Abs: 0.8 10*3/uL (ref 0.7–4.0)
MCH: 30.7 pg (ref 26.0–34.0)
MCHC: 32.8 g/dL (ref 30.0–36.0)
MCV: 93.5 fL (ref 80.0–100.0)
Monocytes Absolute: 0.3 10*3/uL (ref 0.1–1.0)
Monocytes Relative: 9 %
Neutro Abs: 2.1 10*3/uL (ref 1.7–7.7)
Neutrophils Relative %: 66 %
Platelets: 87 10*3/uL — ABNORMAL LOW (ref 150–400)
RBC: 5.08 MIL/uL (ref 4.22–5.81)
RDW: 12.6 % (ref 11.5–15.5)
WBC: 3.3 10*3/uL — ABNORMAL LOW (ref 4.0–10.5)
nRBC: 0 % (ref 0.0–0.2)

## 2021-10-23 LAB — LACTATE DEHYDROGENASE: LDH: 125 U/L (ref 98–192)

## 2021-10-23 LAB — COMPREHENSIVE METABOLIC PANEL
ALT: 19 U/L (ref 0–44)
AST: 22 U/L (ref 15–41)
Albumin: 4.7 g/dL (ref 3.5–5.0)
Alkaline Phosphatase: 46 U/L (ref 38–126)
Anion gap: 5 (ref 5–15)
BUN: 19 mg/dL (ref 8–23)
CO2: 24 mmol/L (ref 22–32)
Calcium: 9.3 mg/dL (ref 8.9–10.3)
Chloride: 109 mmol/L (ref 98–111)
Creatinine, Ser: 1.38 mg/dL — ABNORMAL HIGH (ref 0.61–1.24)
GFR, Estimated: 55 mL/min — ABNORMAL LOW (ref >60)
Glucose, Bld: 106 mg/dL — ABNORMAL HIGH (ref 70–99)
Potassium: 4.2 mmol/L (ref 3.5–5.1)
Sodium: 138 mmol/L (ref 135–145)
Total Bilirubin: 1.2 mg/dL (ref 0.3–1.2)
Total Protein: 7.3 g/dL (ref 6.5–8.1)

## 2021-10-30 ENCOUNTER — Inpatient Hospital Stay (HOSPITAL_BASED_OUTPATIENT_CLINIC_OR_DEPARTMENT_OTHER): Payer: Medicare Other | Admitting: Hematology

## 2021-10-30 ENCOUNTER — Encounter (HOSPITAL_COMMUNITY): Payer: Self-pay | Admitting: Hematology

## 2021-10-30 VITALS — BP 143/92 | HR 102 | Temp 97.7°F | Resp 18 | Wt 249.7 lb

## 2021-10-30 DIAGNOSIS — D696 Thrombocytopenia, unspecified: Secondary | ICD-10-CM | POA: Diagnosis not present

## 2021-10-30 DIAGNOSIS — C8307 Small cell B-cell lymphoma, spleen: Secondary | ICD-10-CM | POA: Diagnosis not present

## 2021-10-30 NOTE — Patient Instructions (Addendum)
[place] at [facility] Discharge Instructions  You were seen and examined today by Dr. Delton Coombes.  Dr. Delton Coombes discussed your most recent lab work which are okay. Your platelets are 87.  Continue your Xarelto as prescribed.  Dr. Delton Coombes has recommended follow-up in 6 months with labs prior. No changes to be made today, this is great news!  Follow-up as scheduled.  Thank you for choosing [place] at [facility] to provide your oncology and hematology care.  To afford each patient quality time with our provider, please arrive at least 15 minutes before your scheduled appointment time.   If you have a lab appointment with the [facility] please come in thru the Main Entrance and check in at the main information desk.  You need to re-schedule your appointment should you arrive 10 or more minutes late.  We strive to give you quality time with our providers, and arriving late affects you and other patients whose appointments are after yours.  Also, if you no show three or more times for appointments you may be dismissed from the clinic at the providers discretion.     Again, thank you for choosing [facility].  Our hope is that these requests will decrease the amount of time that you wait before being seen by our physicians.       _____________________________________________________________  Should you have questions after your visit to [facility], please contact our office at [phone] and follow the prompts.  Our office hours are 8:00 a.m. and 4:30 p.m. Monday - Friday.  Please note that voicemails left after 4:00 p.m. may not be returned until the following business day.  We are closed weekends and major holidays.  You do have access to a nurse 24-7, just call the main number to the clinic [phone] and do not press any options, hold on the line and a nurse will answer the phone.    For prescription  refill requests, have your pharmacy contact our office and allow 72 hours.    Due to Covid, you will need to wear a mask upon entering the hospital. If you do not have a mask, a mask will be given to you at the Main Entrance upon arrival. For doctor visits, patients may have 1 support person age 70 or older with them. For treatment visits, patients can not have anyone with them due to social distancing guidelines and our immunocompromised population.

## 2021-10-30 NOTE — Progress Notes (Signed)
[name], [place] [address]   CLINIC:  Medical Oncology/Hematology  PCP:  [facility], [place] / [place] [address]  [phone]  REASON FOR VISIT:  Follow-up for thrombocytopenia  PRIOR THERAPY: Prednisone 60 mg in 05/2020  CURRENT THERAPY: surveillance  INTERVAL HISTORY:  [name], a 71 y.o. male, returns for routine follow-up for his thrombocytopenia. [name] was last seen on 06/26/2021.  Today he reports feeling well. He reports he has 4 hernias. He had a right PE for which he was hospitalized on 12/4 and was placed on Xarelto. He denies current bleeding and nosebleeds. He also denies fevers, infections, and night sweats. He reports easy bruising on his arms.  Two of his brothers, a sister, and his son have a history of PE.   REVIEW OF SYSTEMS:  Review of Systems  Constitutional:  Positive for fatigue. Negative for appetite change and fever.  HENT:   Negative for nosebleeds.   Respiratory:  Positive for cough and shortness of breath. Negative for hemoptysis.   Gastrointestinal:  Positive for constipation and nausea. Negative for blood in stool.  Endocrine: Negative for hot flashes.  Genitourinary:  Negative for hematuria.   Neurological:  Positive for headaches.  Hematological:  Bruises/bleeds easily (bruising).  Psychiatric/Behavioral:  Positive for sleep disturbance.   All other systems reviewed and are negative.  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   Coronary artery disease    mild by 09/08/13 cath [facility]   GERD (gastroesophageal reflux disease)    barretts esophagus   H/O hiatal hernia    Headache(784.0)    Hypercholesteremia     Hypertension    PONV (postoperative nausea and vomiting)    trouble breathing after last surgery (at [facility])   Shingles    10   Sleep apnea    not tested   Tumor liver    no problems   Past Surgical History:  Procedure Laterality Date   ABDOMINAL SURGERY     x3   back surgeries     x 9   BACK SURGERY     x8   BIOPSY N/A 03/28/2013   Procedure: Distal Esophageal Biopsy;  Surgeon: Rogene Houston, MD;  Location: [facility];  Service: Endoscopy;  Laterality: N/A;   BIOPSY  05/30/2016   Procedure: BIOPSY;  Surgeon: Rogene Houston, MD;  [facility];  Service: Endoscopy;;   BIOPSY  02/19/2018   Procedure: BIOPSY;  Surgeon: Rogene Houston, MD;  [facility];  Service: Endoscopy;;  Barret's esophagus   BIOPSY  08/29/2020   Procedure: BIOPSY;  Surgeon: Rogene Houston, MD;  [facility];  Service: Endoscopy;;   CARDIAC CATHETERIZATION  2020   CHEST SURGERY     lung incisional hernia for hernia   COLONOSCOPY WITH PROPOFOL N/A 02/19/2018   Procedure: COLONOSCOPY WITH PROPOFOL;  Surgeon: Rogene Houston, MD;  [facility];  Service: Endoscopy;  Laterality: N/A;  1:00   CORONARY ANGIOPLASTY WITH STENT PLACEMENT  2020   2 blockages: 1 stent   ESOPHAGOGASTRODUODENOSCOPY (EGD) WITH  PROPOFOL N/A 03/28/2013   Procedure: ESOPHAGOGASTRODUODENOSCOPY (EGD) WITH PROPOFOL;  Surgeon: Rogene Houston, MD;  Location: [facility];  Service: Endoscopy;  Laterality: N/A;  GE junction @ 38, proximal  margin _0    ESOPHAGOGASTRODUODENOSCOPY (EGD) WITH PROPOFOL N/A 05/30/2016   Procedure: ESOPHAGOGASTRODUODENOSCOPY (EGD) WITH PROPOFOL;  Surgeon: Rogene Houston, MD;  [facility];  Service: Endoscopy;  Laterality: N/A;  955   ESOPHAGOGASTRODUODENOSCOPY (EGD) WITH PROPOFOL N/A 02/19/2018   Procedure: ESOPHAGOGASTRODUODENOSCOPY (EGD) WITH PROPOFOL;  Surgeon: Rogene Houston, MD;  [facility];  Service: Endoscopy;  Laterality: N/A;    ESOPHAGOGASTRODUODENOSCOPY (EGD) WITH PROPOFOL N/A 08/29/2020   Procedure: ESOPHAGOGASTRODUODENOSCOPY (EGD) WITH PROPOFOL;  Surgeon: Rogene Houston, MD;  [facility];  Service: Endoscopy;  Laterality: N/A;  AM   HEMORRHOID SURGERY     x2   HERNIA REPAIR     HIATAL HERNIA REPAIR     JOINT REPLACEMENT     lft knee   KNEE ARTHROPLASTY     lft   KNEE ARTHROSCOPY Right    LEG SURGERY     x4 crushed    LITHOTRIPSY     MELANOMA EXCISION     lft abdomen   NECK SURGERY  2012   POLYPECTOMY  02/19/2018   Procedure: POLYPECTOMY;  Surgeon: Rogene Houston, MD;  [facility];  Service: Endoscopy;;  proximal transverse colon (CB x1), descending colon (CBx3)   POSTERIOR CERVICAL FUSION/FORAMINOTOMY  08/18/2011   Procedure: POSTERIOR CERVICAL FUSION/FORAMINOTOMY LEVEL 3;  Surgeon: Ophelia Charter, MD;  [facility];  Service: Neurosurgery;  Laterality: N/A;  Cervical three-four, four-five, five-six posterior cervical  fusion with instrumentation; Right cervical three-four, four-five laminotomy   REVISION TOTAL HIP ARTHROPLASTY Left    SHOULDER ARTHROSCOPY     rt bith defect , spur, rotator cuff   SPINAL CORD STIMULATOR IMPLANT     rt hip   SPINAL CORD STIMULATOR INSERTION N/A 11/03/2016   Procedure: LUMBAR SPINAL CORD STIMULATOR REVISION;  Surgeon: Newman Pies, MD;  Location: [facility];  Service: Neurosurgery;  Laterality: N/A;   THYROID LOBECTOMY     rt    SOCIAL HISTORY:  Social History   Socioeconomic History   Marital status: Married    Spouse name: Not on file   Number of children: Not on file   Years of education: Not on file   Highest education level: Not on file  Occupational History   Occupation: retired  Tobacco Use   Smoking status: Former    Packs/day: 4.00    Years: 10.00    Pack years: 40.00    Types: Cigarettes    Quit date: 08/12/1980    Years since quitting: 41.2   Smokeless tobacco: Never   Tobacco comments:    occ wine  Vaping  Use   Vaping Use: Never used  Substance and Sexual Activity   Alcohol use: Not Currently   Drug use: No   Sexual activity: Yes    Birth control/protection: None  Other Topics Concern   Not on file  Social History Narrative   Not on file   Social Determinants of Health   Financial Resource Strain: Not on file  Food Insecurity: Not on file  Transportation Needs: Not on file  Physical Activity: Not on file  Stress: Not on file  Social Connections: Not on file  Intimate Partner Violence: Not on file    FAMILY HISTORY:  Family History  Problem Relation Age of  Onset   Heart disease Father    Aneurysm Mother    Aneurysm Sister    Heart disease Paternal Grandmother    Heart disease Paternal Grandfather    Heart disease Maternal Grandfather    Heart disease Brother        heart transplant   Aneurysm Other        pt states several members have aortic aneurysm   Skin cancer Other        several members   Lymphoma Brother    Aneurysm Maternal Grandmother     CURRENT MEDICATIONS:  Current Outpatient Medications  Medication Sig Dispense Refill   Aspirin-Acetaminophen-Caffeine (GOODY HEADACHE PO) Take 1-2 packets by mouth daily as needed (headaches). (Patient not taking: Reported on 06/26/2021)     Bioflavonoid Products (ESTER-C) 500-550 MG TABS Take 1 tablet by mouth daily.     calcium-vitamin D (OSCAL WITH D) 500-200 MG-UNIT per tablet Take 1 tablet by mouth 2 (two) times daily.      cyclobenzaprine (FLEXERIL) 10 MG tablet Take 1 tablet (10 mg total) by mouth 3 (three) times daily as needed for muscle spasms. (Patient not taking: Reported on 06/26/2021) 50 tablet 1   dexlansoprazole (DEXILANT) 60 MG capsule Take 60 mg by mouth daily.      diazepam (VALIUM) 10 MG tablet Take 10 mg by mouth 4 (four) times daily as needed (for back pain).  (Patient not taking: Reported on 06/26/2021)     fexofenadine (ALLEGRA) 180 MG tablet Take 180 mg by mouth every evening.     fish oil-omega-3  fatty acids 1000 MG capsule Take 1 g by mouth 2 (two) times daily.     hydrocortisone (CORTEF) 10 MG tablet Take 10 mg by mouth See admin instructions. 10 mg every evening and 20 mg every morning     isosorbide mononitrate (IMDUR) 30 MG 24 hr tablet Take 30 mg by mouth 2 (two) times daily.     lisinopril (PRINIVIL,ZESTRIL) 10 MG tablet Take 10 mg by mouth daily.     metoCLOPramide (REGLAN) 10 MG tablet Take 1 tablet by mouth 2 (two) times daily.     montelukast (SINGULAIR) 10 MG tablet Take 10 mg by mouth daily.      Multiple Vitamin (MULITIVITAMIN WITH MINERALS) TABS Take 1 tablet by mouth daily.     ondansetron (ZOFRAN) 8 MG tablet Take by mouth.     oxyCODONE-acetaminophen (PERCOCET) 10-325 MG tablet Take 1 tablet by mouth every 6 (six) hours as needed for pain. (Patient not taking: Reported on 06/26/2021)     polyethylene glycol powder (GLYCOLAX/MIRALAX) 17 GM/SCOOP powder Take 17 g by mouth daily. 255 g 0   ranolazine (RANEXA) 500 MG 12 hr tablet Take 500 mg by mouth 2 (two) times daily.     rivaroxaban (XARELTO) 20 MG TABS tablet Take 1 tablet (20 mg total) by mouth daily with supper. 30 tablet 2   rosuvastatin (CRESTOR) 10 MG tablet Take 10 mg by mouth every evening.      sildenafil (VIAGRA) 100 MG tablet Take 50 mg by mouth as needed for erectile dysfunction.     No current facility-administered medications for this visit.    ALLERGIES:  Allergies  Allergen Reactions   Demerol Other (See Comments)    "Makes me crazy."    PHYSICAL EXAM:  Performance status (ECOG): 1 - Symptomatic but completely ambulatory  There were no vitals filed for this visit. Wt Readings from Last 3 Encounters:  06/26/21 256 lb (116.1  kg)  06/25/21 253 lb 8 oz (115 kg)  05/09/21 254 lb 9.6 oz (115.5 kg)   Physical Exam Vitals reviewed.  Constitutional:      Appearance: Normal appearance. He is obese.  Cardiovascular:     Rate and Rhythm: Normal rate and regular rhythm.     Pulses: Normal pulses.      Heart sounds: Normal heart sounds.  Pulmonary:     Effort: Pulmonary effort is normal.     Breath sounds: Normal breath sounds.  Abdominal:     Palpations: Abdomen is soft. There is no hepatomegaly, splenomegaly or mass.     Tenderness: There is no abdominal tenderness.  Lymphadenopathy:     Lower Body: No right inguinal adenopathy. No left inguinal adenopathy.  Neurological:     General: No focal deficit present.     Mental Status: He is alert and oriented to person, place, and time.  Psychiatric:        Mood and Affect: Mood normal.        Behavior: Behavior normal.    LABORATORY DATA:  I have reviewed the labs as listed.     Latest Ref Rng & Units 10/23/2021    9:52 AM 06/19/2021    8:50 AM 05/06/2021    4:06 AM  CBC  WBC 4.0 - 10.5 K/uL 3.3   4.2   4.8    Hemoglobin 13.0 - 17.0 g/dL 15.6   15.5   13.9    Hematocrit 39.0 - 52.0 % 47.5   45.8   41.0    Platelets 150 - 400 K/uL 87   94   67        Latest Ref Rng & Units 10/23/2021    9:52 AM 06/19/2021    8:50 AM 05/06/2021    4:06 AM  CMP  Glucose 70 - 99 mg/dL 106   91   98    BUN 8 - 23 mg/dL _0 Creatinine 0.61 - 1.24 mg/dL 1.38   1.36   1.49    Sodium 135 - 145 mmol/L 138   140   138    Potassium 3.5 - 5.1 mmol/L 4.2   3.9   3.7    Chloride 98 - 111 mmol/L 109   104   105    CO2 22 - 32 mmol/L _1 Calcium 8.9 - 10.3 mg/dL 9.3   9.1   8.8    Total Protein 6.5 - 8.1 g/dL 7.3   6.9     Total Bilirubin 0.3 - 1.2 mg/dL 1.2   1.2     Alkaline Phos 38 - 126 U/L 46   44     AST 15 - 41 U/L 22   22     ALT 0 - 44 U/L 19   18         Component Value Date/Time   RBC 5.08 10/23/2021 0952   MCV 93.5 10/23/2021 0952   MCH 30.7 10/23/2021 0952   MCHC 32.8 10/23/2021 0952   RDW 12.6 10/23/2021 0952   LYMPHSABS 0.8 10/23/2021 0952   MONOABS 0.3 10/23/2021 0952   EOSABS 0.0 10/23/2021 0952   BASOSABS 0.0 10/23/2021 0952    DIAGNOSTIC IMAGING:  I have independently reviewed the scans and  discussed with the patient. No results found.   ASSESSMENT:  1.  Moderate thrombocytopenia: -Seen as referral from Dr. Nolon Rod office for  decreased platelet count of 80 on CBC on 10/21/2018. -Ex-smoker, quit in 1982, smoked 1 to 2 packs/day for 10 years.  He also spent 2 years at [facility]. -CT abdomen on 11/08/2018 showed normal size spleen with no abdominal or pelvic pathology. -BMBX on 03/01/2019 showed slightly hypercellular marrow for age with trilineage hematopoiesis including abundant megakaryocytes and predominantly normal morphology.  Overall myeloid changes are nonspecific and not diagnostic for MDS.  Chromosome analysis was normal.  MDS FISH panel was normal.  Core biopsy showed small lymphoid aggregates, features are not considered specific or diagnostic of lymphoproliferative disorder. -He was treated with pulsed dexamethasone around the 01/27/2020 with improvement of platelet count to 70 K.  He could not tolerate dexamethasone due to sleeplessness. -He was also treated with prednisone 60 mg daily on 05/22/2020.  He took it only for 2 to 3 days and stopped it due to inability to sleep. -Treated with immunoglobulin from 05/14/2020 through 05/18/2020. -PET scan on 06/05/2020 view shows no hypermetabolic lymph nodes in the chest, abdomen or pelvis.  Splenomegaly measuring 18 cm with normal metabolic activity. -Bone marrow biopsy on 06/26/2020 with mildly hypercellular marrow, no significant dysplasia.  Scattered large lymphoid aggregates with a predominance of B cells.  Flow cytometry was nondiagnostic.  There is no expression of CD5 or CD10 on the B cells. 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  However no clonality is demonstrated in the kappa light chain gene (IGK) -Myeloma FISH panel was negative.  Cytogenetics was 31, XY. -He was evaluated as second opinion by Dr. 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 - Received 4 cycles of rituximab from 09/05/2020 through 10/03/2020.   2.  Malignant melanoma: -Melanoma resected on the left side of the abdomen with left axillary sentinel lymph node biopsy at [facility] 14 years ago.  Reportedly stage III.     PLAN:  1.  Splenic marginal zone lymphoma: -He does not report any B symptoms. - Physical examination today did not reveal any palpable adenopathy or splenomegaly. - He reports some sweats during the daytime which is stable. - Reviewed labs today which showed creatinine 1.38 and stable.  CBC shows white count 3.3 with platelet count 87 and ANC of 2.1.  Hemoglobin was normal.  LDH was normal. - He had a recent CT scan of the abdomen done at [facility] which showed splenomegaly with 15 cm. - Recommend RTC 6 months for follow-up with repeat labs.  2.  Unprovoked pulmonary embolism: -CT scan (05/05/2021) segmental pulmonary emboli within the right middle and lower lobe.  Ultrasound Doppler was negative. - He is tolerating Xarelto 20 mg daily very well. - He has strong family history with a brother who developed PE after heart transplant and another brother who had unprovoked PE on lifelong anticoagulation. - His sister also had unprovoked PE.  His son developed DVT after surgery after a bike accident. - Based on his personal and family history, I have recommended indefinite anticoagulation. -He is seeing a hematologist at [facility] who is planning to decrease dose of Xarelto to 10 mg daily after 6 months of full anticoagulation.  I think he needs full dose anticoagulation unless his platelet count drops below 50 K.  Orders placed this encounter:  No orders of the defined types were placed in this encounter.    Derek Jack, MD [place] [phone]  I, Thana Ates, am acting as a Education administrator for Dr. Derek Jack.  I, Derek Jack MD, have  reviewed the above documentation for accuracy and completeness, and I agree with the above.

## 2021-11-18 ENCOUNTER — Encounter (INDEPENDENT_AMBULATORY_CARE_PROVIDER_SITE_OTHER): Payer: Self-pay | Admitting: Gastroenterology

## 2021-11-18 ENCOUNTER — Ambulatory Visit (INDEPENDENT_AMBULATORY_CARE_PROVIDER_SITE_OTHER): Payer: Medicare Other | Admitting: Gastroenterology

## 2021-11-18 VITALS — BP 167/99 | HR 80 | Temp 97.3°F | Ht 72.0 in | Wt 254.8 lb

## 2021-11-18 DIAGNOSIS — K589 Irritable bowel syndrome without diarrhea: Secondary | ICD-10-CM | POA: Insufficient documentation

## 2021-11-18 DIAGNOSIS — K581 Irritable bowel syndrome with constipation: Secondary | ICD-10-CM

## 2021-11-18 DIAGNOSIS — K3184 Gastroparesis: Secondary | ICD-10-CM

## 2021-11-18 DIAGNOSIS — K219 Gastro-esophageal reflux disease without esophagitis: Secondary | ICD-10-CM

## 2021-11-18 MED ORDER — ONDANSETRON HCL 8 MG PO TABS: 8.0000 mg | ORAL_TABLET | Freq: Three times a day (TID) | ORAL | 2 refills | Status: DC | PRN

## 2021-11-18 MED ORDER — LUBIPROSTONE 8 MCG PO CAPS: 8.0000 ug | ORAL_CAPSULE | Freq: Two times a day (BID) | ORAL | 3 refills | Status: DC

## 2021-11-18 NOTE — Progress Notes (Addendum)
[name], M.D. Gastroenterology & Hepatology [facility] [address] [place], [place] [address]  Primary Care Physician: Ruben School, MD [address] [place] [address]  I will communicate my assessment and recommendations to the referring MD via EMR.  Problems: GERD Short segment Barrett's esophagus  Gastroparesis IBS-C  History of Present Illness: [name] is a 71 y.o. 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  The patient was last seen on 06/25/2021 by Dr Laural Golden. At that time, the patient was continued on Dexilant for GERD.  He was also continued on Reglan 10 mg before breakfast and evening meal.  Patient reports that for the last 2 months he has been presenting recurrent vomiting episodes almost on a daily basis . States this happens usually after waking up and not usually after having a meal. Sometime she has to wake up in the middle of the night to vomit. He reports that he felt his symptoms worsened when his Reglan was decreased.He very rarely takes any Percocet as it causes significant discomfort - takes it once a month.  States having some heartburn once a week possible. He is taking Dexilant 60 mg every day which she feels that is controlling his symptoms completely.  The patient also reports having a chronic history of constipation.  He currently takes 4 gummies of Metamucil daily which helps with his constipation. Has a bowel movement every 2-3 days, but still has to strain. He tried Miralax in the past and states it did not work for him.  Linzess was not covered by his insurance.  Notably, the patient was seen by [facility] (Dr. Posey Pronto) on 01/04/2021 at the patient had a positive hepatitis B core antibody.  He was recommended to be started on tenofovir for prevention of reactivation of hepatitis B while on rituximab and to be continued on this up to a year after his last dose of rituximab. He only received it for one month and finished in May 2022.  Upon review of his most recent blood work-up from 11/11/2021, he had normal aminotransferases with AST of 23 and ALT of 20.  The patient denies having any fever, chills, hematochezia, melena, hematemesis, abdominal distention, abdominal pain, diarrhea, jaundice, pruritus or weight loss.  Last EGD: 08/29/2020 - Normal hypopharynx. - Normal proximal esophagus, mid esophagus and distal esophagus. - Esophageal mucosal changes suspicious for short-segment Barrett's esophagus. Biopsied. - Z-line irregular, 36 cm from the incisors. - 2 cm hiatal hernia. - Loose fundal wrap otherwise normal stomach. Single pill noted in proximal stomach. - Normal duodenal bulb and second portion of the duodenum. 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   Coronary artery disease    mild by 09/08/13 cath [facility]   GERD (gastroesophageal reflux disease)    barretts esophagus   H/O hiatal hernia    Headache(784.0)    Hypercholesteremia  Hypertension    PONV (postoperative nausea and vomiting)    trouble breathing after last surgery (at [facility])   Shingles    10   Sleep apnea    not tested   Tumor liver    no problems    Past Surgical History: Past Surgical History:  Procedure Laterality Date   ABDOMINAL SURGERY     x3   back surgeries     x 9   BACK SURGERY     x8   BIOPSY N/A 03/28/2013    Procedure: Distal Esophageal Biopsy;  Surgeon: Rogene Houston, MD;  Location: [facility];  Service: Endoscopy;  Laterality: N/A;   BIOPSY  05/30/2016   Procedure: BIOPSY;  Surgeon: Rogene Houston, MD;  [facility];  Service: Endoscopy;;   BIOPSY  02/19/2018   Procedure: BIOPSY;  Surgeon: Rogene Houston, MD;  [facility];  Service: Endoscopy;;  Barret's esophagus   BIOPSY  08/29/2020   Procedure: BIOPSY;  Surgeon: Rogene Houston, MD;  [facility];  Service: Endoscopy;;   CARDIAC CATHETERIZATION  2020   CHEST SURGERY     lung incisional hernia for hernia   COLONOSCOPY WITH PROPOFOL N/A 02/19/2018   Procedure: COLONOSCOPY WITH PROPOFOL;  Surgeon: Rogene Houston, MD;  [facility];  Service: Endoscopy;  Laterality: N/A;  1:00   CORONARY ANGIOPLASTY WITH STENT PLACEMENT  2020   2 blockages: 1 stent   ESOPHAGOGASTRODUODENOSCOPY (EGD) WITH PROPOFOL N/A 03/28/2013   Procedure: ESOPHAGOGASTRODUODENOSCOPY (EGD) WITH PROPOFOL;  Surgeon: Rogene Houston, MD;  Location: [facility];  Service: Endoscopy;  Laterality: N/A;  GE junction @ 38, proximal  margin '@37'$    ESOPHAGOGASTRODUODENOSCOPY (EGD) WITH PROPOFOL N/A 05/30/2016   Procedure: ESOPHAGOGASTRODUODENOSCOPY (EGD) WITH PROPOFOL;  Surgeon: Rogene Houston, MD;  [facility];  Service: Endoscopy;  Laterality: N/A;  955   ESOPHAGOGASTRODUODENOSCOPY (EGD) WITH PROPOFOL N/A 02/19/2018   Procedure: ESOPHAGOGASTRODUODENOSCOPY (EGD) WITH PROPOFOL;  Surgeon: Rogene Houston, MD;  [facility];  Service: Endoscopy;  Laterality: N/A;   ESOPHAGOGASTRODUODENOSCOPY (EGD) WITH PROPOFOL N/A 08/29/2020   Procedure: ESOPHAGOGASTRODUODENOSCOPY (EGD) WITH PROPOFOL;  Surgeon: Rogene Houston, MD;  [facility];  Service: Endoscopy;  Laterality: N/A;  AM   HEMORRHOID SURGERY     x2   HERNIA REPAIR     HIATAL HERNIA REPAIR     JOINT REPLACEMENT     lft knee   KNEE ARTHROPLASTY     lft   KNEE  ARTHROSCOPY Right    LEG SURGERY     x4 crushed    LITHOTRIPSY     MELANOMA EXCISION     lft abdomen   NECK SURGERY  2012   POLYPECTOMY  02/19/2018   Procedure: POLYPECTOMY;  Surgeon: Rogene Houston, MD;  [facility];  Service: Endoscopy;;  proximal transverse colon (CB x1), descending colon (CBx3)   POSTERIOR CERVICAL FUSION/FORAMINOTOMY  08/18/2011   Procedure: POSTERIOR CERVICAL FUSION/FORAMINOTOMY LEVEL 3;  Surgeon: Ophelia Charter, MD;  Location: [facility];  Service: Neurosurgery;  Laterality: N/A;  Cervical three-four, four-five, five-six posterior cervical  fusion with instrumentation; Right cervical three-four, four-five laminotomy   REVISION TOTAL HIP ARTHROPLASTY Left    SHOULDER ARTHROSCOPY     rt bith defect , spur, rotator cuff   SPINAL CORD STIMULATOR IMPLANT     rt hip   SPINAL CORD STIMULATOR INSERTION N/A 11/03/2016   Procedure: LUMBAR SPINAL CORD  STIMULATOR REVISION;  Surgeon: Newman Pies, MD;  Location: [place];  Service: Neurosurgery;  Laterality: N/A;   THYROID LOBECTOMY     rt    Family History: Family History  Problem Relation Age of Onset   Heart disease Father    Aneurysm Mother    Aneurysm Sister    Heart disease Paternal Grandmother    Heart disease Paternal Grandfather    Heart disease Maternal Grandfather    Heart disease Brother        heart transplant   Aneurysm Other        pt states several members have aortic aneurysm   Skin cancer Other        several members   Lymphoma Brother    Aneurysm Maternal Grandmother     Social History: Social History   Tobacco Use  Smoking Status Former   Packs/day: 4.00   Years: 10.00   Total pack years: 40.00   Types: Cigarettes   Quit date: 08/12/1980   Years since quitting: 41.2  Smokeless Tobacco Never  Tobacco Comments   occ wine   Social History   Substance and Sexual Activity  Alcohol Use Not Currently   Social History   Substance and Sexual Activity  Drug Use No     Allergies: Allergies  Allergen Reactions   Demerol Other (See Comments)    "Makes me crazy."    Medications: Current Outpatient Medications  Medication Sig Dispense Refill   Aspirin-Acetaminophen-Caffeine (GOODY HEADACHE PO) Take 1-2 packets by mouth daily as needed (headaches).     Bioflavonoid Products (ESTER-C) 500-550 MG TABS Take 1 tablet by mouth daily.     calcium-vitamin D (OSCAL WITH D) 500-200 MG-UNIT per tablet Take 1 tablet by mouth 2 (two) times daily.      cyclobenzaprine (FLEXERIL) 10 MG tablet Take 1 tablet (10 mg total) by mouth 3 (three) times daily as needed for muscle spasms. 50 tablet 1   dexlansoprazole (DEXILANT) 60 MG capsule Take 60 mg by mouth daily.      diazepam (VALIUM) 10 MG tablet Take 10 mg by mouth 4 (four) times daily as needed (for back pain).     fexofenadine (ALLEGRA) 180 MG tablet Take 180 mg by mouth every evening.     fish oil-omega-3 fatty acids 1000 MG capsule Take 1 g by mouth 2 (two) times daily.     hydrocortisone (CORTEF) 10 MG tablet Take 10 mg by mouth See admin instructions. 10 mg every evening and 20 mg every morning     lisinopril (PRINIVIL,ZESTRIL) 10 MG tablet Take 10 mg by mouth daily.     Metamucil Fiber CHEW Chew 1 each by mouth daily.     metoCLOPramide (REGLAN) 10 MG tablet Take 1 tablet by mouth in the morning and at bedtime.     montelukast (SINGULAIR) 10 MG tablet Take 10 mg by mouth daily.      Multiple Vitamin (MULITIVITAMIN WITH MINERALS) TABS Take 1 tablet by mouth daily.     ondansetron (ZOFRAN) 8 MG tablet Take 8 mg by mouth as needed.     oxyCODONE-acetaminophen (PERCOCET) 10-325 MG tablet Take 1 tablet by mouth every 6 (six) hours as needed for pain.     ranolazine (RANEXA) 500 MG 12 hr tablet Take 500 mg by mouth 2 (two) times daily.     rivaroxaban (XARELTO) 20 MG TABS tablet Take 1 tablet (20 mg total) by mouth daily with supper. 30 tablet 2   rosuvastatin (CRESTOR) 10 MG  tablet Take 10 mg by mouth every  evening.      sildenafil (VIAGRA) 100 MG tablet Take 50 mg by mouth as needed for erectile dysfunction.     isosorbide mononitrate (IMDUR) 30 MG 24 hr tablet Take 30 mg by mouth 2 (two) times daily.     polyethylene glycol powder (GLYCOLAX/MIRALAX) 17 GM/SCOOP powder Take 17 g by mouth daily. (Patient not taking: Reported on 11/18/2021) 255 g 0   No current facility-administered medications for this visit.    Review of Systems: GENERAL: negative for malaise, night sweats HEENT: No changes in hearing or vision, no nose bleeds or other nasal problems. NECK: Negative for lumps, goiter, pain and significant neck swelling RESPIRATORY: Negative for cough, wheezing CARDIOVASCULAR: Negative for chest pain, leg swelling, palpitations, orthopnea GI: SEE HPI MUSCULOSKELETAL: Negative for joint pain or swelling, back pain, and muscle pain. SKIN: Negative for lesions, rash PSYCH: Negative for sleep disturbance, mood disorder and recent psychosocial stressors. HEMATOLOGY Negative for prolonged bleeding, bruising easily, and swollen nodes. ENDOCRINE: Negative for cold or heat intolerance, polyuria, polydipsia and goiter. NEURO: negative for tremor, gait imbalance, syncope and seizures. The remainder of the review of systems is noncontributory.   Physical Exam: BP (!) 167/99 (BP Location: Left Arm, Patient Position: Sitting, Cuff Size: Large)   Pulse 80   Temp (!) 97.3 F (36.3 C) (Oral)   Ht 6' (1.829 m)   Wt 254 lb 12.8 oz (115.6 kg)   BMI 34.56 kg/m  GENERAL: The patient is AO x3, in no acute distress. HEENT: Head is normocephalic and atraumatic. EOMI are intact. Mouth is well hydrated and without lesions. NECK: Supple. No masses LUNGS: Clear to auscultation. No presence of rhonchi/wheezing/rales. Adequate chest expansion HEART: RRR, normal s1 and s2. ABDOMEN: Soft, nontender, no guarding, no peritoneal signs, and nondistended. BS +. No masses. EXTREMITIES: Without any cyanosis, clubbing,  rash, lesions or edema. NEUROLOGIC: AOx3, no focal motor deficit. SKIN: no jaundice, no rashes  Imaging/Labs: as above  I personally reviewed and interpreted the available labs, imaging and endoscopic files.  Impression and Plan: JORDEN MINCHEY is a 70 y.o. 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  Regarding his gastroparesis, he is presenting recurrent episodes of regurgitation and early satiety despite taking Reglan at least once a day.  He is very rarely taking any opiates and I do not suspect this is aggravating his gastroparesis.  It is very likely his symptoms are related to worsening gastroparesis.  We discussed about the possible options for management of his disease which include pharmacological agents such as Reglan, domperidone and macrolides.  He reports that domperidone is not an option for him given its cost but he will try to increase the dose of Reglan for now.  He is interested in other options such as G POEM to improve his symptomatology -he will read about it for now.  We will proceed with repeat EGD to evaluate for organic etiologies leading to his presentation for now.  In terms of his IBS-C, he may benefit from starting pharmacological agents to increase his bowel movement frequency.  I will start him on Amitiza 8 mcg twice daily and he can continue Metamucil fiber Gummies for now.  - Schedule EGD - Start amitiza 8 mcg BID - Continue  Metamucil fiber gummies - Read about G-POEM procedure  All questions were answered.      Harvel Quale, MD Gastroenterology and Hepatology [facility]

## 2021-11-18 NOTE — Patient Instructions (Addendum)
Schedule EGD and colonoscopy Start amitiza 8 mcg BID Continue Metamucil fiber gummies Read about G-POEM proceddure  G-POEM  G-POEM stands for Gastric Per Oral Endoscopic Myotomy.  It is a minimally invasive endoscopic procedure performed for the treatment of conditions where permanent relaxation of the pylorus muscle is helpful to improve a person's condition.  The most common condition treated with G-POEM is gastroparesis or delayed gastric emptying.  People can develop delayed gastric emptying as a result of diabetes, surgeries, and some medications, among other reasons.  POEM is performed in much the same way as a standard upper endoscopy but requires specialized training and tools to perform.   Importantly, some patients are not able to be treated with G-POEM despite these most advanced endoscopic techniques, so it is important for patients to meet with the performing physician to determine whether G-POEM is a good option.

## 2021-11-19 ENCOUNTER — Other Ambulatory Visit (INDEPENDENT_AMBULATORY_CARE_PROVIDER_SITE_OTHER): Payer: Self-pay

## 2021-11-20 ENCOUNTER — Encounter (INDEPENDENT_AMBULATORY_CARE_PROVIDER_SITE_OTHER): Payer: Self-pay

## 2021-11-24 ENCOUNTER — Other Ambulatory Visit (INDEPENDENT_AMBULATORY_CARE_PROVIDER_SITE_OTHER): Payer: Self-pay | Admitting: Gastroenterology

## 2021-11-24 DIAGNOSIS — K5904 Chronic idiopathic constipation: Secondary | ICD-10-CM

## 2021-11-24 DIAGNOSIS — K5909 Other constipation: Secondary | ICD-10-CM

## 2021-11-24 MED ORDER — TRULANCE 3 MG PO TABS: 1.0000 | ORAL_TABLET | Freq: Every day | ORAL | 3 refills | Status: DC

## 2021-11-24 NOTE — Progress Notes (Signed)
Insurance denied coverage for Amitiza. We will send prescription for Trulance 3 mg every day.  Please let the patient know about this.

## 2021-11-25 ENCOUNTER — Telehealth (INDEPENDENT_AMBULATORY_CARE_PROVIDER_SITE_OTHER): Payer: Self-pay | Admitting: *Deleted

## 2021-12-17 NOTE — Telephone Encounter (Signed)
error 

## 2021-12-23 ENCOUNTER — Ambulatory Visit (INDEPENDENT_AMBULATORY_CARE_PROVIDER_SITE_OTHER): Payer: Medicare Other | Admitting: Gastroenterology

## 2021-12-30 ENCOUNTER — Encounter (HOSPITAL_COMMUNITY): Payer: Self-pay

## 2021-12-30 ENCOUNTER — Other Ambulatory Visit: Payer: Self-pay

## 2021-12-30 ENCOUNTER — Encounter (HOSPITAL_COMMUNITY)
Admission: RE | Admit: 2021-12-30 | Discharge: 2021-12-30 | Disposition: A | Discharge status: Home / Self Care | Payer: Medicare Other | Source: Ambulatory Visit | Attending: Gastroenterology | Admitting: Gastroenterology

## 2021-12-30 NOTE — Progress Notes (Signed)
   12/30/21 7737  OBSTRUCTIVE SLEEP APNEA  Have you ever been diagnosed with sleep apnea through a sleep study? No  Do you snore loudly (loud enough to be heard through closed doors)?  1  Do you often feel tired, fatigued, or sleepy during the daytime (such as falling asleep during driving or talking to someone)? 0  Has anyone observed you stop breathing during your sleep? 1  Do you have, or are you being treated for high blood pressure? 1  BMI more than 35 kg/m2? 0  Age > 50 (1-yes) 1  Neck circumference greater than:Male 16 inches or larger, Male 17inches or larger? 0  Male Gender (Yes=1) 1  Obstructive Sleep Apnea Score 5  Score 5 or greater  Results sent to PCP

## 2021-12-31 ENCOUNTER — Ambulatory Visit (HOSPITAL_BASED_OUTPATIENT_CLINIC_OR_DEPARTMENT_OTHER): Payer: Medicare Other | Admitting: Anesthesiology

## 2021-12-31 ENCOUNTER — Encounter (HOSPITAL_COMMUNITY)
Admission: RE | Disposition: A | Discharge status: Home / Self Care | Payer: Self-pay | Source: Home / Self Care | Attending: Gastroenterology

## 2021-12-31 ENCOUNTER — Ambulatory Visit (HOSPITAL_COMMUNITY): Payer: Medicare Other | Admitting: Anesthesiology

## 2021-12-31 ENCOUNTER — Ambulatory Visit (HOSPITAL_COMMUNITY)
Admission: RE | Admit: 2021-12-31 | Discharge: 2021-12-31 | Disposition: A | Discharge status: Home / Self Care | Payer: Medicare Other | Attending: Gastroenterology | Admitting: Gastroenterology

## 2021-12-31 ENCOUNTER — Encounter (HOSPITAL_COMMUNITY): Payer: Self-pay | Admitting: Gastroenterology

## 2021-12-31 DIAGNOSIS — G4730 Sleep apnea, unspecified: Secondary | ICD-10-CM | POA: Diagnosis not present

## 2021-12-31 DIAGNOSIS — Z85820 Personal history of malignant melanoma of skin: Secondary | ICD-10-CM | POA: Insufficient documentation

## 2021-12-31 DIAGNOSIS — E785 Hyperlipidemia, unspecified: Secondary | ICD-10-CM | POA: Insufficient documentation

## 2021-12-31 DIAGNOSIS — Z79899 Other long term (current) drug therapy: Secondary | ICD-10-CM | POA: Insufficient documentation

## 2021-12-31 DIAGNOSIS — K2289 Other specified disease of esophagus: Secondary | ICD-10-CM | POA: Diagnosis not present

## 2021-12-31 DIAGNOSIS — I10 Essential (primary) hypertension: Secondary | ICD-10-CM | POA: Insufficient documentation

## 2021-12-31 DIAGNOSIS — R112 Nausea with vomiting, unspecified: Secondary | ICD-10-CM

## 2021-12-31 DIAGNOSIS — Z86711 Personal history of pulmonary embolism: Secondary | ICD-10-CM | POA: Diagnosis not present

## 2021-12-31 DIAGNOSIS — K219 Gastro-esophageal reflux disease without esophagitis: Secondary | ICD-10-CM | POA: Insufficient documentation

## 2021-12-31 DIAGNOSIS — F419 Anxiety disorder, unspecified: Secondary | ICD-10-CM | POA: Insufficient documentation

## 2021-12-31 DIAGNOSIS — K254 Chronic or unspecified gastric ulcer with hemorrhage: Secondary | ICD-10-CM

## 2021-12-31 DIAGNOSIS — K3184 Gastroparesis: Secondary | ICD-10-CM | POA: Insufficient documentation

## 2021-12-31 DIAGNOSIS — Z8719 Personal history of other diseases of the digestive system: Secondary | ICD-10-CM | POA: Diagnosis not present

## 2021-12-31 DIAGNOSIS — K2270 Barrett's esophagus without dysplasia: Secondary | ICD-10-CM

## 2021-12-31 DIAGNOSIS — I2510 Atherosclerotic heart disease of native coronary artery without angina pectoris: Secondary | ICD-10-CM | POA: Insufficient documentation

## 2021-12-31 DIAGNOSIS — R109 Unspecified abdominal pain: Secondary | ICD-10-CM | POA: Diagnosis not present

## 2021-12-31 DIAGNOSIS — K449 Diaphragmatic hernia without obstruction or gangrene: Secondary | ICD-10-CM | POA: Diagnosis not present

## 2021-12-31 DIAGNOSIS — Z7901 Long term (current) use of anticoagulants: Secondary | ICD-10-CM | POA: Insufficient documentation

## 2021-12-31 DIAGNOSIS — K5710 Diverticulosis of small intestine without perforation or abscess without bleeding: Secondary | ICD-10-CM | POA: Insufficient documentation

## 2021-12-31 DIAGNOSIS — K922 Gastrointestinal hemorrhage, unspecified: Secondary | ICD-10-CM

## 2021-12-31 DIAGNOSIS — Z87891 Personal history of nicotine dependence: Secondary | ICD-10-CM | POA: Diagnosis not present

## 2021-12-31 DIAGNOSIS — K589 Irritable bowel syndrome without diarrhea: Secondary | ICD-10-CM | POA: Diagnosis not present

## 2021-12-31 HISTORY — PX: ESOPHAGOGASTRODUODENOSCOPY (EGD) WITH PROPOFOL: SHX5813

## 2021-12-31 HISTORY — PX: BIOPSY: SHX5522

## 2021-12-31 SURGERY — ESOPHAGOGASTRODUODENOSCOPY (EGD) WITH PROPOFOL
Anesthesia: General

## 2021-12-31 MED ORDER — MOTEGRITY 2 MG PO TABS: 1.0000 | ORAL_TABLET | Freq: Every day | ORAL | 3 refills | Status: DC

## 2021-12-31 MED ORDER — PROPOFOL 10 MG/ML IV BOLUS
INTRAVENOUS | Status: DC | PRN
  Administered 2021-12-31: 50 mg via INTRAVENOUS
  Administered 2021-12-31: 30 mg via INTRAVENOUS
  Administered 2021-12-31: 100 mg via INTRAVENOUS

## 2021-12-31 MED ORDER — LACTATED RINGERS IV SOLN: INTRAVENOUS | Status: DC

## 2021-12-31 MED ORDER — LIDOCAINE HCL 1 % IJ SOLN
INTRAMUSCULAR | Status: DC | PRN
  Administered 2021-12-31: 50 mg via INTRADERMAL

## 2021-12-31 NOTE — Transfer of Care (Signed)
Immediate Anesthesia Transfer of Care Note  Patient: [name]  Procedure(s) Performed: ESOPHAGOGASTRODUODENOSCOPY (EGD) WITH PROPOFOL BIOPSY  Patient Location: Short Stay  Anesthesia Type:General  Level of Consciousness: awake  Airway & Oxygen Therapy: Patient Spontanous Breathing  Post-op Assessment: Report given to RN and Post -op Vital signs reviewed and stable  Post vital signs: Reviewed and stable  Last Vitals:  Vitals Value Taken Time  BP 109/62 12/31/21 1427  Temp 36.6 C 12/31/21 1427  Pulse 77 12/31/21 1427  Resp 16 12/31/21 1427  SpO2 96 % 12/31/21 1427    Last Pain:  Vitals:   12/31/21 1427  TempSrc: Oral  PainSc: 0-No pain      Patients Stated Pain Goal: 5 (15/83/09 4076)  Complications: No notable events documented.

## 2021-12-31 NOTE — H&P (Addendum)
[name] is an 71 y.o. male.   Chief Complaint: vomiting, heartburn HPI: [name] is a 71 y.o. 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  Patient has been taking Reglan 10 mg every 6 hours.  He was referred for due to undergo further evaluation for gastroparesis but per [facility] recommendations he should follow-up at [facility] for further evaluation of his gastroparesis.  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   Coronary artery disease    mild by 09/08/13 cath [facility]   GERD (gastroesophageal reflux disease)    barretts esophagus   H/O hiatal hernia    Headache(784.0)    Hypercholesteremia    Hypertension    PONV (postoperative nausea and vomiting)    trouble breathing after last surgery (at [facility])   Shingles    10   Sleep apnea    not tested   Tumor liver    no problems    Past Surgical History:  Procedure Laterality Date   ABDOMINAL SURGERY     x3   back surgeries     x 9   BACK SURGERY     x8   BIOPSY N/A 03/28/2013   Procedure: Distal Esophageal Biopsy;  Surgeon: Rogene Houston, MD;  Location: [facility];  Service: Endoscopy;  Laterality: N/A;   BIOPSY  05/30/2016   Procedure: BIOPSY;  Surgeon: Rogene Houston, MD;  [facility];  Service: Endoscopy;;   BIOPSY  02/19/2018   Procedure: BIOPSY;  Surgeon: Rogene Houston, MD;  [facility];  Service:  Endoscopy;;  Barret's esophagus   BIOPSY  08/29/2020   Procedure: BIOPSY;  Surgeon: Rogene Houston, MD;  [facility];  Service: Endoscopy;;   CARDIAC CATHETERIZATION  2020   CHEST SURGERY     lung incisional hernia for hernia   COLONOSCOPY WITH PROPOFOL N/A 02/19/2018   Procedure: COLONOSCOPY WITH PROPOFOL;  Surgeon: Rogene Houston, MD;  [facility];  Service: Endoscopy;  Laterality: N/A;  1:00   CORONARY ANGIOPLASTY WITH STENT PLACEMENT  2020   2 blockages: 1 stent   ESOPHAGOGASTRODUODENOSCOPY (EGD) WITH PROPOFOL N/A 03/28/2013   Procedure: ESOPHAGOGASTRODUODENOSCOPY (EGD) WITH PROPOFOL;  Surgeon: Rogene Houston, MD;  Location: [facility];  Service: Endoscopy;  Laterality: N/A;  GE junction @ 38, proximal  margin '@37'$    ESOPHAGOGASTRODUODENOSCOPY (EGD) WITH PROPOFOL N/A 05/30/2016   Procedure: ESOPHAGOGASTRODUODENOSCOPY (EGD) WITH PROPOFOL;  Surgeon: Rogene Houston, MD;  [facility];  Service: Endoscopy;  Laterality: N/A;  955   ESOPHAGOGASTRODUODENOSCOPY (EGD) WITH PROPOFOL N/A 02/19/2018   Procedure: ESOPHAGOGASTRODUODENOSCOPY (EGD) WITH PROPOFOL;  Surgeon: Rogene Houston, MD;  [facility];  Service: Endoscopy;  Laterality: N/A;   ESOPHAGOGASTRODUODENOSCOPY (EGD) WITH PROPOFOL N/A 08/29/2020   Procedure: ESOPHAGOGASTRODUODENOSCOPY (EGD) WITH PROPOFOL;  Surgeon: Rogene Houston, MD;  [facility];  Service: Endoscopy;  Laterality: N/A;  AM   HEMORRHOID SURGERY     x2   HERNIA REPAIR     HIATAL HERNIA REPAIR     JOINT REPLACEMENT     lft knee   KNEE ARTHROPLASTY     lft   KNEE ARTHROSCOPY Right    LEG SURGERY     x4 crushed    LITHOTRIPSY     MELANOMA EXCISION     lft abdomen   NECK SURGERY  2012   POLYPECTOMY  02/19/2018   Procedure: POLYPECTOMY;  Surgeon: Rogene Houston, MD;  [facility];  Service: Endoscopy;;  proximal transverse colon (CB x1), descending colon (CBx3)   POSTERIOR CERVICAL  FUSION/FORAMINOTOMY  08/18/2011   Procedure: POSTERIOR CERVICAL FUSION/FORAMINOTOMY LEVEL 3;  Surgeon: Ophelia Charter, MD;  [facility]: [facility];  Service: Neurosurgery;  Laterality: N/A;  Cervical three-four, four-five, five-six posterior cervical  fusion with instrumentation; Right cervical three-four, four-five laminotomy   REVISION TOTAL HIP ARTHROPLASTY Left    SHOULDER ARTHROSCOPY     rt bith defect , spur, rotator cuff   SPINAL CORD STIMULATOR IMPLANT     rt hip   SPINAL CORD STIMULATOR INSERTION N/A 11/03/2016   Procedure: LUMBAR SPINAL CORD STIMULATOR REVISION;  Surgeon: Newman Pies, MD;  Location: [facility];  Service: Neurosurgery;  Laterality: N/A;   THYROID LOBECTOMY     rt    Family History  Problem Relation Age of Onset   Heart disease Father    Aneurysm Mother    Aneurysm Sister    Heart disease Paternal Grandmother    Heart disease Paternal Grandfather    Heart disease Maternal Grandfather    Heart disease Brother        heart transplant   Aneurysm Other        pt states several members have aortic aneurysm   Skin cancer Other        several members   Lymphoma Brother    Aneurysm Maternal Grandmother    Social History:  reports that he quit smoking about 41 years ago. His smoking use included cigarettes. He has a 40.00 pack-year smoking history. He has never used smokeless tobacco. He reports that he does not currently use alcohol. He reports that he does not use drugs.  Allergies:  Allergies  Allergen Reactions   Demerol Other (See Comments)    "Makes me crazy."    Medications Prior to Admission  Medication Sig Dispense Refill   Aspirin-Acetaminophen-Caffeine (GOODY HEADACHE PO) Take 1-2 packets by mouth daily as needed (headaches).     Bioflavonoid Products (ESTER-C) 500-550 MG TABS Take 1 tablet by mouth daily.     Calcium Carbonate-Vitamin D 600-10 MG-MCG TABS Take 1 tablet by mouth 2 (two) times daily.      cyclobenzaprine (FLEXERIL) 10 MG tablet  Take 1 tablet (10 mg total) by mouth 3 (three) times daily as needed for muscle spasms. (Patient taking differently: Take 10 mg by mouth daily.) 50 tablet 1   dexlansoprazole (DEXILANT) 60 MG capsule Take 60 mg by mouth daily.      diazepam (VALIUM) 10 MG tablet Take 10 mg by mouth 4 (four) times daily as needed (for back pain).     fexofenadine (ALLEGRA) 180 MG tablet Take 180 mg by mouth every evening.     fish oil-omega-3 fatty acids 1000 MG capsule Take 1 g by mouth 2 (two) times daily.     hydrocortisone (  CORTEF) 10 MG tablet Take 10-20 mg by mouth See admin instructions. Take 10 mg every evening and 20 mg every morning     lisinopril (PRINIVIL,ZESTRIL) 10 MG tablet Take 10 mg by mouth daily.     Metamucil Fiber CHEW Chew 1 each by mouth daily.     metoCLOPramide (REGLAN) 10 MG tablet Take 1 tablet by mouth in the morning and at bedtime.     montelukast (SINGULAIR) 10 MG tablet Take 10 mg by mouth daily.      Multiple Vitamin (MULITIVITAMIN WITH MINERALS) TABS Take 1 tablet by mouth daily.     ondansetron (ZOFRAN) 8 MG tablet Take 1 tablet (8 mg total) by mouth every 8 (eight) hours as needed for vomiting or nausea. 30 tablet 2   oxyCODONE-acetaminophen (PERCOCET) 10-325 MG tablet Take 1 tablet by mouth every 6 (six) hours as needed for pain.     ranolazine (RANEXA) 500 MG 12 hr tablet Take 500 mg by mouth 2 (two) times daily.     rivaroxaban (XARELTO) 20 MG TABS tablet Take 1 tablet (20 mg total) by mouth daily with supper. 30 tablet 2   rosuvastatin (CRESTOR) 10 MG tablet Take 5 mg by mouth every evening.     sildenafil (VIAGRA) 100 MG tablet Take 50 mg by mouth as needed for erectile dysfunction.     Plecanatide (TRULANCE) 3 MG TABS Take 1 tablet by mouth daily. (Patient not taking: Reported on 12/20/2021) 90 tablet 3   polyethylene glycol powder (GLYCOLAX/MIRALAX) 17 GM/SCOOP powder Take 17 g by mouth daily. (Patient not taking: Reported on 11/18/2021) 255 g 0    No results found for this  or any previous visit (from the past 48 hour(s)). No results found.  Review of Systems  Constitutional: Negative.   HENT: Negative.    Eyes: Negative.   Respiratory: Negative.    Cardiovascular: Negative.   Gastrointestinal:  Positive for abdominal distention and abdominal pain.  Endocrine: Negative.   Genitourinary: Negative.   Musculoskeletal: Negative.   Skin: Negative.   Allergic/Immunologic: Negative.   Neurological: Negative.   Hematological: Negative.   Psychiatric/Behavioral: Negative.      Blood pressure 135/76, pulse 81, temperature 97.7 F (36.5 C), temperature source Oral, resp. rate 18, SpO2 96 %. Physical Exam  GENERAL: The patient is AO x3, in no acute distress. HEENT: Head is normocephalic and atraumatic. EOMI are intact. Mouth is well hydrated and without lesions. NECK: Supple. No masses LUNGS: Clear to auscultation. No presence of rhonchi/wheezing/rales. Adequate chest expansion HEART: RRR, normal s1 and s2. ABDOMEN: Soft, nontender, no guarding, no peritoneal signs, and nondistended. BS +. No masses. EXTREMITIES: Without any cyanosis, clubbing, rash, lesions or edema. NEUROLOGIC: AOx3, no focal motor deficit. SKIN: no jaundice, no rashes  Assessment/Plan CIRO TASHIRO is a 71 y.o. 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  We will proceed with EGD.  Harvel Quale, MD 12/31/2021, 12:41 PM

## 2021-12-31 NOTE — Op Note (Addendum)
[facility] Patient Name: [name] Procedure Date: 12/31/2021 1:29 PM MRN: [id number] Date of Birth: [birth year] Attending MD: [name] ,  CSN: [id number] Age: 71 Admit Type: Outpatient Procedure:                Upper GI endoscopy Indications:              Abdominal pain, Nausea with vomiting Providers:                [name], Lambert Mody, [place]                            Risa Grill, Technician Referring MD:              Medicines:                Monitored Anesthesia Care Complications:            No immediate complications. Estimated Blood Loss:     Estimated blood loss: none. Procedure:                Pre-Anesthesia Assessment:                           - Prior to the procedure, a History and Physical                            was performed, and patient medications, allergies                            and sensitivities were reviewed. The patient's                            tolerance of previous anesthesia was reviewed.                           - The risks and benefits of the procedure and the                            sedation options and risks were discussed with the                            patient. All questions were answered and informed                            consent was obtained.                           - ASA Grade Assessment: III - A patient with severe                            systemic disease.                           After obtaining informed consent, the endoscope was                            passed under direct vision. Throughout the  procedure, the patient's blood pressure, pulse, and                            oxygen saturations were monitored continuously. The                            [id number]-H190 ([id number]) scope was introduced through the                            mouth, and advanced to the second part of duodenum.                            The upper GI endoscopy was accomplished without                             difficulty. The patient tolerated the procedure                            well. Scope In: 2:16:02 PM Scope Out: 2:22:54 PM Total Procedure Duration: 0 hours 6 minutes 52 seconds  Findings:      Salmon-colored mucosa was present. The maximum longitudinal extent of       these esophageal mucosal changes was 1 cm in length.      Multiple localized medium erosions with stigmata of recent bleeding were       found in the gastric fundus and in the gastric body. Biopsies were taken       with a cold forceps for Helicobacter pylori testing.      A large non-bleeding diverticulum was found in the second portion of the       duodenum. There was extensive amount of food debris. Impression:               - Salmon-colored mucosa consistent with                            short-segment Barrett's esophagus.                           - Erosive gastropathy with stigmata of recent                            bleeding. Biopsied.                           - Non-bleeding duodenal diverticulum. Moderate Sedation:      Per Anesthesia Care Recommendation:           - Discharge patient to home (ambulatory).                           - Resume previous diet.                           - Await pathology results.                           - No Goody/BC powders, ibuprofen, naproxen, or  other non-steroidal anti-inflammatory drugs.                           - Continue Dexilant 60 mg qday.                           - Follow up with [facility] regarding                            gastroparesis.                           - Start Motegrity 2 mg qday Procedure Code(s):        --- Professional ---                           [phone], Esophagogastroduodenoscopy, flexible,                            transoral; with biopsy, single or multiple Diagnosis Code(s):        --- Professional ---                           K22.8, Other specified diseases of esophagus                            K92.2, Gastrointestinal hemorrhage, unspecified                           R10.9, Unspecified abdominal pain                           R11.2, Nausea with vomiting, unspecified                           K57.10, Diverticulosis of small intestine without                            perforation or abscess without bleeding CPT copyright 2019 American Medical Association. All rights reserved. The codes documented in this report are preliminary and upon coder review may  be revised to meet current compliance requirements. [name], MD [name],  12/31/2021 2:32:02 PM This report has been signed electronically. Number of Addenda: 0

## 2021-12-31 NOTE — Discharge Instructions (Addendum)
You are being discharged to home.  Resume your previous diet.  We are waiting for your pathology results.  Do not take any ibuprofen (including Advil, Motrin or Nuprin), Goody/BC powders, naproxen, or other non-steroidal anti-inflammatory drugs.  Follow up with [facility] regarding gastroparesis. Start Motegrity 2 mg qday

## 2021-12-31 NOTE — Anesthesia Preprocedure Evaluation (Signed)
Anesthesia Evaluation  Patient identified by MRN, date of birth, ID band Patient awake    Reviewed: Allergy & Precautions, H&P , NPO status , Patient's Chart, lab work & pertinent test results, reviewed documented beta blocker date and time   History of Anesthesia Complications (+) PONV and history of anesthetic complications  Airway Mallampati: II  TM Distance: >3 FB Neck ROM: full    Dental no notable dental hx.    Pulmonary sleep apnea , former smoker,    Pulmonary exam normal breath sounds clear to auscultation       Cardiovascular Exercise Tolerance: Good hypertension, + CAD   Rhythm:regular Rate:Normal     Neuro/Psych  Headaches, Anxiety negative psych ROS   GI/Hepatic Neg liver ROS, hiatal hernia, GERD  Medicated,  Endo/Other  negative endocrine ROS  Renal/GU negative Renal ROS  negative genitourinary   Musculoskeletal   Abdominal   Peds  Hematology  (+) Blood dyscrasia, anemia ,   Anesthesia Other Findings 1. Left ventricular ejection fraction, by estimation, is 50 to 55%. The  left ventricle has low normal function. The left ventricle demonstrates  regional wall motion abnormalities. The basal inferior wall appears  hypokinetic. The rest of the LV segments  have low normal contractility.There is moderate concentric left  ventricular hypertrophy. Left ventricular diastolic parameters are  consistent with Grade I diastolic dysfunction (impaired relaxation).  2. Right ventricular systolic function is normal. The right ventricular  size is mildly enlarged. There is normal pulmonary artery systolic  pressure. The estimated right ventricular systolic pressure is 03.2 mmHg.  3. The mitral valve is normal in structure. Trivial mitral valve  regurgitation.  4. The aortic valve is tricuspid. There is mild thickening of the aortic  valve. Aortic valve regurgitation is mild. Aortic valve sclerosis is  present,  with no evidence of aortic valve stenosis.  5. Aortic dilatation noted. There is mild dilatation of the aortic root,  measuring 41 mm. There is mild-to-moderate dilatation of the ascending  aorta, measuring 44 mm.  6. The inferior vena cava is normal in size with greater than 50%  respiratory variability, suggesting right atrial pressure of 3 mmHg.  Reproductive/Obstetrics negative OB ROS                             Anesthesia Physical Anesthesia Plan  ASA: 3  Anesthesia Plan: General   Post-op Pain Management:    Induction:   PONV Risk Score and Plan: Propofol infusion  Airway Management Planned:   Additional Equipment:   Intra-op Plan:   Post-operative Plan:   Informed Consent: I have reviewed the patients History and Physical, chart, labs and discussed the procedure including the risks, benefits and alternatives for the proposed anesthesia with the patient or authorized representative who has indicated his/her understanding and acceptance.     Dental Advisory Given  Plan Discussed with: CRNA  Anesthesia Plan Comments:         Anesthesia Quick Evaluation

## 2021-12-31 NOTE — Anesthesia Postprocedure Evaluation (Signed)
Anesthesia Post Note  Patient: [name]  Procedure(s) Performed: ESOPHAGOGASTRODUODENOSCOPY (EGD) WITH PROPOFOL BIOPSY  Patient location during evaluation: Phase II Anesthesia Type: General Level of consciousness: awake Pain management: pain level controlled Vital Signs Assessment: post-procedure vital signs reviewed and stable Respiratory status: spontaneous breathing and respiratory function stable Cardiovascular status: blood pressure returned to baseline and stable Postop Assessment: no headache and no apparent nausea or vomiting Anesthetic complications: no Comments: Late entry   No notable events documented.   Last Vitals:  Vitals:   12/31/21 1223 12/31/21 1427  BP: 135/76 109/62  Pulse: 81 77  Resp: 18 16  Temp: 36.5 C 36.6 C  SpO2: 96% 96%    Last Pain:  Vitals:   12/31/21 1427  TempSrc: Oral  PainSc: 0-No pain                 Louann Sjogren

## 2022-01-02 LAB — SURGICAL PATHOLOGY

## 2022-01-07 ENCOUNTER — Encounter (HOSPITAL_COMMUNITY): Payer: Self-pay | Admitting: Gastroenterology

## 2022-01-27 ENCOUNTER — Emergency Department (HOSPITAL_COMMUNITY): Payer: Medicare Other

## 2022-01-27 ENCOUNTER — Encounter (HOSPITAL_COMMUNITY): Payer: Self-pay

## 2022-01-27 ENCOUNTER — Other Ambulatory Visit: Payer: Self-pay

## 2022-01-27 ENCOUNTER — Emergency Department (HOSPITAL_COMMUNITY)
Admission: EM | Admit: 2022-01-27 | Discharge: 2022-01-27 | Disposition: A | Discharge status: Home / Self Care | Payer: Medicare Other | Attending: Emergency Medicine | Admitting: Emergency Medicine

## 2022-01-27 DIAGNOSIS — E860 Dehydration: Secondary | ICD-10-CM

## 2022-01-27 DIAGNOSIS — Z79899 Other long term (current) drug therapy: Secondary | ICD-10-CM | POA: Insufficient documentation

## 2022-01-27 DIAGNOSIS — R109 Unspecified abdominal pain: Secondary | ICD-10-CM | POA: Insufficient documentation

## 2022-01-27 DIAGNOSIS — S20211A Contusion of right front wall of thorax, initial encounter: Secondary | ICD-10-CM | POA: Diagnosis not present

## 2022-01-27 DIAGNOSIS — W1830XA Fall on same level, unspecified, initial encounter: Secondary | ICD-10-CM | POA: Insufficient documentation

## 2022-01-27 DIAGNOSIS — I10 Essential (primary) hypertension: Secondary | ICD-10-CM | POA: Diagnosis not present

## 2022-01-27 DIAGNOSIS — I4891 Unspecified atrial fibrillation: Secondary | ICD-10-CM

## 2022-01-27 DIAGNOSIS — I4892 Unspecified atrial flutter: Secondary | ICD-10-CM | POA: Diagnosis not present

## 2022-01-27 DIAGNOSIS — Y9389 Activity, other specified: Secondary | ICD-10-CM | POA: Diagnosis not present

## 2022-01-27 DIAGNOSIS — S299XXA Unspecified injury of thorax, initial encounter: Secondary | ICD-10-CM | POA: Diagnosis present

## 2022-01-27 LAB — CBC WITH DIFFERENTIAL/PLATELET
Abs Immature Granulocytes: 0.1 10*3/uL — ABNORMAL HIGH (ref 0.00–0.07)
Basophils Absolute: 0 10*3/uL (ref 0.0–0.1)
Basophils Relative: 0 %
Eosinophils Absolute: 0 10*3/uL (ref 0.0–0.5)
Eosinophils Relative: 0 %
HCT: 46.2 % (ref 39.0–52.0)
Hemoglobin: 15.7 g/dL (ref 13.0–17.0)
Lymphocytes Relative: 23 %
Lymphs Abs: 0.9 10*3/uL (ref 0.7–4.0)
MCH: 31.6 pg (ref 26.0–34.0)
MCHC: 34 g/dL (ref 30.0–36.0)
MCV: 93 fL (ref 80.0–100.0)
Metamyelocytes Relative: 3 %
Monocytes Absolute: 0.6 10*3/uL (ref 0.1–1.0)
Monocytes Relative: 16 %
Neutro Abs: 2.2 10*3/uL (ref 1.7–7.7)
Neutrophils Relative %: 58 %
Platelets: 65 10*3/uL — ABNORMAL LOW (ref 150–400)
RBC: 4.97 MIL/uL (ref 4.22–5.81)
RDW: 13 % (ref 11.5–15.5)
WBC: 3.8 10*3/uL — ABNORMAL LOW (ref 4.0–10.5)
nRBC: 0 % (ref 0.0–0.2)

## 2022-01-27 LAB — URINALYSIS, ROUTINE W REFLEX MICROSCOPIC
Bilirubin Urine: NEGATIVE
Glucose, UA: NEGATIVE mg/dL
Hgb urine dipstick: NEGATIVE
Ketones, ur: NEGATIVE mg/dL
Leukocytes,Ua: NEGATIVE
Nitrite: NEGATIVE
Protein, ur: 30 mg/dL — AB
Specific Gravity, Urine: 1.014 (ref 1.005–1.030)
pH: 6 (ref 5.0–8.0)

## 2022-01-27 LAB — HEPATIC FUNCTION PANEL
ALT: 21 U/L (ref 0–44)
AST: 24 U/L (ref 15–41)
Albumin: 4.6 g/dL (ref 3.5–5.0)
Alkaline Phosphatase: 50 U/L (ref 38–126)
Bilirubin, Direct: 0.3 mg/dL — ABNORMAL HIGH (ref 0.0–0.2)
Indirect Bilirubin: 1.3 mg/dL — ABNORMAL HIGH (ref 0.3–0.9)
Total Bilirubin: 1.6 mg/dL — ABNORMAL HIGH (ref 0.3–1.2)
Total Protein: 8.1 g/dL (ref 6.5–8.1)

## 2022-01-27 LAB — BASIC METABOLIC PANEL
Anion gap: 10 (ref 5–15)
BUN: 21 mg/dL (ref 8–23)
CO2: 24 mmol/L (ref 22–32)
Calcium: 9 mg/dL (ref 8.9–10.3)
Chloride: 103 mmol/L (ref 98–111)
Creatinine, Ser: 1.19 mg/dL (ref 0.61–1.24)
GFR, Estimated: >60 mL/min (ref >60)
Glucose, Bld: 96 mg/dL (ref 70–99)
Potassium: 3.4 mmol/L — ABNORMAL LOW (ref 3.5–5.1)
Sodium: 137 mmol/L (ref 135–145)

## 2022-01-27 LAB — LIPASE, BLOOD: Lipase: 47 U/L (ref 11–51)

## 2022-01-27 MED ORDER — DILTIAZEM HCL 25 MG/5ML IV SOLN
20.0000 mg | Freq: Once | INTRAVENOUS | Status: AC
  Administered 2022-01-27: 20 mg via INTRAVENOUS
  Filled 2022-01-27: qty 5

## 2022-01-27 MED ORDER — ONDANSETRON HCL 4 MG/2ML IJ SOLN
4.0000 mg | Freq: Once | INTRAMUSCULAR | Status: AC
  Administered 2022-01-27: 4 mg via INTRAVENOUS
  Filled 2022-01-27: qty 2

## 2022-01-27 MED ORDER — IOHEXOL 300 MG/ML  SOLN
100.0000 mL | Freq: Once | INTRAMUSCULAR | Status: AC | PRN
  Administered 2022-01-27: 100 mL via INTRAVENOUS

## 2022-01-27 MED ORDER — SODIUM CHLORIDE 0.9 % IV SOLN: INTRAVENOUS | Status: DC

## 2022-01-27 MED ORDER — METOPROLOL SUCCINATE ER 25 MG PO TB24: 25.0000 mg | ORAL_TABLET | Freq: Every day | ORAL | 1 refills | Status: DC

## 2022-01-27 NOTE — Discharge Instructions (Signed)
Thankfully you are already on one of the medications that you need to be taking which is called Xarelto, it is about heritable prevent blood clots from occurring.  We will need to start a different medication called metoprolol.  I want you to take 1 tablet once a day.  This will help to keep your heart slow so that it does not go too fast.  It appears that you were a little bit dehydrated so we gave you some IV fluids.  We checked your blood work and some x-rays and there is no signs of broken ribs, your blood work was reassuring.  I did discuss your care with our cardiologist who has recommended that you follow-up closely with your heart doctor or with our heart service, either way.  I have given you the phone number above.  Thank you for allowing [place] to treat you in the emergency department today.  After reviewing your examination and potential testing that was done it appears that you are safe to go home.  I would like for you to follow-up with your doctor within the next several days, have them obtain your results and follow-up with them to review all of these tests.  If you should develop severe or worsening symptoms return to the emergency department immediately

## 2022-01-27 NOTE — ED Notes (Signed)
Patient transported to CT 

## 2022-01-27 NOTE — ED Triage Notes (Signed)
Wife reports to EMS around 4:30am this morning patient was going to bathroom and fell, due to weakness.  Patient c/o of right rib pain.  Patient reports to EMS that he hit the floor, denies hitting head, denies LOC

## 2022-01-27 NOTE — ED Provider Notes (Signed)
[facility] EMERGENCY DEPARTMENT Provider Note   CSN: [id number] Arrival date & time: 01/27/22  1037     History  Chief Complaint  Patient presents with   [name] is a 71 y.o. male.  HPI   This patient is a 71 year old male, he has a known history of acid reflux hypertension and high cholesterol.  The patient was in his usual state of health until he got up to use the bathroom at 3:00 in the morning.  He lost his balance and fell to the ground landing on his right shoulder and ribs.  He has had pain in that area since that time.  His wife helped him get up off of the ground but he has pain with moving his arm, pain with taking a breath and pain with palpation of the right side of the chest.  Paramedics noted his oxygen was 90 to 91% on room air and he is not usually on oxygen, they placed him on 2 L prehospital.  The patient has no pain in his legs, no head injury, no headache, he is not coughing or having any fevers.  He is not nauseated or vomiting.  No other medicines given prehospital.  Home Medications Prior to Admission medications   Medication Sig Start Date End Date Taking? Authorizing Provider  Bioflavonoid Products (ESTER-C) 500-550 MG TABS Take 1 tablet by mouth daily.   Yes [provider]  Calcium Carbonate-Vitamin D 600-10 MG-MCG TABS Take 1 tablet by mouth 2 (two) times daily.    Yes [provider]  dexlansoprazole (DEXILANT) 60 MG capsule Take 60 mg by mouth daily.    Yes [provider]  diazepam (VALIUM) 10 MG tablet Take 10 mg by mouth 4 (four) times daily as needed (for back pain).   Yes [provider]  fexofenadine (ALLEGRA) 180 MG tablet Take 180 mg by mouth every evening.   Yes [provider]  fish oil-omega-3 fatty acids 1000 MG capsule Take 1 g by mouth 2 (two) times daily.   Yes [provider]  hydrocortisone (CORTEF) 10 MG tablet Take 10-20 mg by mouth See admin instructions. Take 10 mg  every evening and 20 mg every morning 12/04/20  Yes [provider]  lisinopril (PRINIVIL,ZESTRIL) 10 MG tablet Take 10 mg by mouth daily.   Yes [provider]  Metamucil Fiber CHEW Chew 1 each by mouth daily.   Yes [provider]  metoCLOPramide (REGLAN) 10 MG tablet Take 1 tablet by mouth in the morning and at bedtime. 08/03/20  Yes [provider]  metoprolol succinate (TOPROL-XL) 25 MG 24 hr tablet Take 1 tablet (25 mg total) by mouth daily. 01/27/22  Yes Noemi Chapel, MD  montelukast (SINGULAIR) 10 MG tablet Take 10 mg by mouth daily.    Yes [provider]  Multiple Vitamin (MULITIVITAMIN WITH MINERALS) TABS Take 1 tablet by mouth daily.   Yes [provider]  ondansetron (ZOFRAN) 8 MG tablet Take 1 tablet (8 mg total) by mouth every 8 (eight) hours as needed for vomiting or nausea. 11/18/21  Yes Harvel Quale, MD  oxyCODONE-acetaminophen (PERCOCET) 10-325 MG tablet Take 1 tablet by mouth every 6 (six) hours as needed for pain.   Yes [provider]  ranolazine (RANEXA) 500 MG 12 hr tablet Take 500 mg by mouth 2 (two) times daily. 04/23/20  Yes [provider]  rivaroxaban (XARELTO) 20 MG TABS tablet Take 1 tablet (20 mg  total) by mouth daily with supper. 05/29/21  Yes Shah, Pratik D, DO  rosuvastatin (CRESTOR) 10 MG tablet Take 5 mg by mouth every evening.   Yes [provider]  sildenafil (VIAGRA) 100 MG tablet Take 50 mg by mouth as needed for erectile dysfunction. 02/10/20  Yes [provider]  cyclobenzaprine (FLEXERIL) 10 MG tablet Take 1 tablet (10 mg total) by mouth 3 (three) times daily as needed for muscle spasms. Patient taking differently: Take 10 mg by mouth daily. 11/05/16   Newman Pies, MD  PAXLOVID, 300/100, 20 x 150 MG & 10 x '100MG'$  TBPK Take by mouth. 01/24/22   [provider]  Plecanatide (TRULANCE) 3 MG TABS Take 1 tablet by mouth daily. Patient not taking: Reported  on 12/20/2021 11/24/21   Montez Morita, Quillian Quince, MD  polyethylene glycol powder [facility]) 17 GM/SCOOP powder Take 17 g by mouth daily. Patient not taking: Reported on 11/18/2021 06/25/21   Rogene Houston, MD  Prucalopride Succinate (MOTEGRITY) 2 MG TABS Take 1 tablet (2 mg total) by mouth daily. 12/31/21   Harvel Quale, MD      Allergies    Demerol    Review of Systems   Review of Systems  All other systems reviewed and are negative.   Physical Exam Updated Vital Signs BP 120/73   Pulse 77   Temp 97.8 F (36.6 C)   Resp (!) 22   Ht 1.956 m ('6\' 5"'$ )   Wt 113.4 kg   SpO2 92%   BMI 29.65 kg/m  Physical Exam Vitals and nursing note reviewed.  Constitutional:      General: He is not in acute distress.    Appearance: He is well-developed.  HENT:     Head: Normocephalic and atraumatic.     Mouth/Throat:     Pharynx: No oropharyngeal exudate.  Eyes:     General: No scleral icterus.       Right eye: No discharge.        Left eye: No discharge.     Conjunctiva/sclera: Conjunctivae normal.     Pupils: Pupils are equal, round, and reactive to light.  Neck:     Thyroid: No thyromegaly.     Vascular: No JVD.  Cardiovascular:     Rate and Rhythm: Normal rate and regular rhythm.     Heart sounds: Normal heart sounds. No murmur heard.    No friction rub. No gallop.  Pulmonary:     Effort: Pulmonary effort is normal. No respiratory distress.     Breath sounds: Normal breath sounds. No wheezing or rales.  Chest:     Chest wall: Tenderness present.  Abdominal:     General: Bowel sounds are normal. There is no distension.     Palpations: Abdomen is soft. There is no mass.     Tenderness: There is no abdominal tenderness.  Musculoskeletal:        General: Tenderness present. Normal range of motion.     Cervical back: Normal range of motion and neck supple.     Right lower leg: No edema.     Left lower leg: No edema.     Comments: There is normal range of  motion of the right shoulder, there is normal range of motion of the right elbow, there is no obvious tenderness or deformity to the right upper extremity.  There is tenderness over the right chest wall but no crepitance or subcutaneous emphysema or signs of bruising.  The patient is able  to take a deep breath but has minimal pain with doing this.  Lower extremities are without injury  Lymphadenopathy:     Cervical: No cervical adenopathy.  Skin:    General: Skin is warm and dry.     Findings: No erythema or rash.  Neurological:     General: No focal deficit present.     Mental Status: He is alert.     Coordination: Coordination normal.  Psychiatric:        Behavior: Behavior normal.     ED Results / Procedures / Treatments   Labs (all labs ordered are listed, but only abnormal results are displayed) Labs Reviewed  CBC WITH DIFFERENTIAL/PLATELET - Abnormal; Notable for the following components:      Result Value   WBC 3.8 (*)    Platelets 65 (*)    Abs Immature Granulocytes 0.10 (*)    All other components within normal limits  BASIC METABOLIC PANEL - Abnormal; Notable for the following components:   Potassium 3.4 (*)    All other components within normal limits  HEPATIC FUNCTION PANEL - Abnormal; Notable for the following components:   Total Bilirubin 1.6 (*)    Bilirubin, Direct 0.3 (*)    Indirect Bilirubin 1.3 (*)    All other components within normal limits  URINE CULTURE  LIPASE, BLOOD  URINALYSIS, ROUTINE W REFLEX MICROSCOPIC    EKG EKG Interpretation  Date/Time:  Monday January 27 2022 11:55:16 EDT Ventricular Rate:  107 PR Interval:    QRS Duration: 97 QT Interval:  321 QTC Calculation: 429 R Axis:   -57 Text Interpretation: Atrial fibrillation Ventricular premature complex Left anterior fascicular block Low voltage, precordial leads new onset afib, compared to prior Confirmed by Noemi Chapel [id number]) on 01/27/2022 12:39:39 PM  Radiology CT ABDOMEN PELVIS W  CONTRAST  Result Date: 01/27/2022 CLINICAL DATA:  Generalized abdominal pain, fell at 0430 hours this morning going to the bathroom, weakness, RIGHT rib pain EXAM: CT ABDOMEN AND PELVIS WITH CONTRAST TECHNIQUE: Multidetector CT imaging of the abdomen and pelvis was performed using the standard protocol following bolus administration of intravenous contrast. RADIATION DOSE REDUCTION: This exam was performed according to the departmental dose-optimization program which includes automated exposure control, adjustment of the mA and/or kV according to patient size and/or use of iterative reconstruction technique. CONTRAST:  154m OMNIPAQUE IOHEXOL 300 MG/ML SOLN IV. No oral contrast. COMPARISON:  PET-CT 06/05/2020 FINDINGS: Lower chest: Minimal LEFT basilar atelectasis. Hepatobiliary: Gallbladder and liver normal appearance Pancreas: Multiple calcifications within or adjacent to uncinate process of pancreas without soft tissue mass or cyst. Otherwise normal appearance. Spleen: Normal appearance Adrenals/Urinary Tract: Adrenal glands normal appearance. Peripelvic cyst LEFT kidney 4.3 x 3.1 cm image 31 unchanged, simple features by CT; no follow-up imaging recommended. Small nonobstructing renal calculi. No hydronephrosis or hydroureter. Bladder unremarkable. Stomach/Bowel: Descending and sigmoid diverticulosis without evidence of diverticulitis. Normal appendix. Stomach and remaining bowel loops unremarkable. Vascular/Lymphatic: Atherosclerotic calcifications aorta, iliac arteries, coronary arteries. Aorta normal caliber. No adenopathy. Reproductive: Minimal prostatic enlargement. Seminal vesicles unremarkable. Other: BILATERAL hernias containing fat.  No free air or free fluid. Musculoskeletal: LEFT hip prosthesis. Osseous demineralization. Prior lumbosacral fusion L1-S1. Intraspinal stimulator. No acute fractures. IMPRESSION: BILATERAL hernias containing fat. Distal colonic diverticulosis without evidence of  diverticulitis. Small nonobstructing renal calculi. No acute intra-abdominal or intrapelvic abnormalities. Aortic Atherosclerosis (ICD10-I70.0). Electronically Signed   By: MLavonia DanaM.D.   On: 01/27/2022 13:46   DG Ribs Unilateral W/Chest Right  Result Date:  01/27/2022 CLINICAL DATA:  Fall with right rib pain EXAM: RIGHT RIBS AND CHEST - 3+ VIEW COMPARISON:  None Available. FINDINGS: No fracture or other bone lesions are seen involving the ribs. There is no evidence of pneumothorax or pleural effusion. Both lungs are clear. Heart size and mediastinal contours are within normal limits. IMPRESSION: Negative. Electronically Signed   By: Nelson Chimes M.D.   On: 01/27/2022 11:44   DG Shoulder Right  Result Date: 01/27/2022 CLINICAL DATA:  [place] today with shoulder pain EXAM: RIGHT SHOULDER - 2+ VIEW COMPARISON:  None Available. FINDINGS: Glenohumeral joint is normal. No evidence of regional fracture. Mild widening of the [facility] joint which could be chronic or indicate a minor AC joint injury. IMPRESSION: No fracture or dislocation. Question mild widening of the [facility] joint which could be chronic or could indicate a ligamentous injury. Electronically Signed   By: Nelson Chimes M.D.   On: 01/27/2022 11:43    Procedures Procedures    Medications Ordered in ED Medications  0.9 %  sodium chloride infusion ( Intravenous New Bag/Given 01/27/22 1253)  diltiazem (CARDIZEM) injection 20 mg (20 mg Intravenous Given 01/27/22 1254)  ondansetron (ZOFRAN) injection 4 mg (4 mg Intravenous Given 01/27/22 1253)  iohexol (OMNIPAQUE) 300 MG/ML solution 100 mL (100 mLs Intravenous Contrast Given 01/27/22 1319)    ED Course/ Medical Decision Making/ A&P Clinical Course as of 01/27/22 1439  Mon Jan 27, 2022  1228 Patient now reports to me that he has had some degree of chronic gastroparesis-like symptoms with delayed emptying, increasing abdominal pain and has had vomiting for a couple of days which is left him, weak.  We will  proceed with some labs and a CT scan to make sure there is no signs of bowel obstruction as he has had multiple abdominal surgeries in the past and we will need to rule out adhesions or bowel blockage [BM]    Clinical Course User Index [BM] Noemi Chapel, MD                           Medical Decision Making Amount and/or Complexity of Data Reviewed Labs: ordered. Radiology: ordered.  Risk Prescription drug management.   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, has had EGD by gastroenterology earlier in the month, medications reviewed, anticoagulated on Xarelto    Imaging Studies ordered:  I ordered imaging studies including rib x-ray with a chest as well as a right shoulder I independently visualized and interpreted imaging which showed no acute fractures or dislocations are seen CT scan shows no intra-abdominal findings of concern I agree with the radiologist interpretation   Medicines ordered and prescription drug management:  I ordered medication including single dose of diltiazem as well as IV fluids, antiemetics and pain medications for dehydration, atrial fibrillation and pain and nausea Reevaluation of the patient after these medicines showed that the patient improved, in fact the arrhythmia is now rate controlled I have reviewed the patients home medicines and have made adjustments as needed   Problem List / ED Course:  The patient had atrial fibrillation and flutter, this ultimately improved significantly and the patient was rate controlled  with a heart rate in the 70s at the time of discharge after single dose of  Cardizem.  Improved with IV fluids and antiemetics and a CT scan which was reassuring gave the patient confirmation that he would be able to go home. Case was discussed with the consultant Dr. Candee Furbish with the cardiology service who recommends the patient follow-up in the outpatient setting as he is already anticoagulated and now rate controlled. The patient was instructed on the follow-up course and is agreeable to the plan as is his spouse.   Social Determinants of Health:  Chronic gastric emptying delay Already taking medications for same   I have discussed with the patient at the bedside the results, and the meaning of these results.  They have expressed her understanding to the need for follow-up with primary care physician        Final Clinical Impression(s) / ED Diagnoses Final diagnoses:  Atrial fibrillation and flutter ([place])  Rib contusion, right, initial encounter  Dehydration    Rx / DC Orders ED Discharge Orders          Ordered    metoprolol succinate (TOPROL-XL) 25 MG 24 hr tablet  Daily        01/27/22 1434              Noemi Chapel, MD 01/27/22 1439

## 2022-01-27 NOTE — ED Notes (Signed)
Patient transported to X-ray 

## 2022-01-27 NOTE — ED Notes (Signed)
MD at bedside. 

## 2022-01-27 NOTE — ED Notes (Signed)
Patient transported back to room from CT 

## 2022-01-28 LAB — URINE CULTURE: Culture: NO GROWTH

## 2022-02-05 ENCOUNTER — Inpatient Hospital Stay (HOSPITAL_COMMUNITY)
Admission: EM | Admit: 2022-02-05 | Discharge: 2022-02-14 | DRG: 193 | Disposition: A | Discharge status: Home Health | Payer: Medicare Other | Attending: Internal Medicine | Admitting: Internal Medicine

## 2022-02-05 ENCOUNTER — Emergency Department (HOSPITAL_COMMUNITY): Payer: Medicare Other

## 2022-02-05 ENCOUNTER — Encounter (HOSPITAL_COMMUNITY): Payer: Self-pay | Admitting: Emergency Medicine

## 2022-02-05 ENCOUNTER — Other Ambulatory Visit: Payer: Self-pay

## 2022-02-05 DIAGNOSIS — Z86718 Personal history of other venous thrombosis and embolism: Secondary | ICD-10-CM

## 2022-02-05 DIAGNOSIS — K2961 Other gastritis with bleeding: Secondary | ICD-10-CM | POA: Diagnosis present

## 2022-02-05 DIAGNOSIS — K219 Gastro-esophageal reflux disease without esophagitis: Secondary | ICD-10-CM | POA: Diagnosis present

## 2022-02-05 DIAGNOSIS — E860 Dehydration: Secondary | ICD-10-CM | POA: Diagnosis present

## 2022-02-05 DIAGNOSIS — Z86711 Personal history of pulmonary embolism: Secondary | ICD-10-CM

## 2022-02-05 DIAGNOSIS — E7800 Pure hypercholesterolemia, unspecified: Secondary | ICD-10-CM | POA: Diagnosis present

## 2022-02-05 DIAGNOSIS — K581 Irritable bowel syndrome with constipation: Secondary | ICD-10-CM | POA: Diagnosis present

## 2022-02-05 DIAGNOSIS — Z9682 Presence of neurostimulator: Secondary | ICD-10-CM

## 2022-02-05 DIAGNOSIS — R609 Edema, unspecified: Secondary | ICD-10-CM | POA: Diagnosis not present

## 2022-02-05 DIAGNOSIS — Z8616 Personal history of COVID-19: Secondary | ICD-10-CM

## 2022-02-05 DIAGNOSIS — Z96642 Presence of left artificial hip joint: Secondary | ICD-10-CM | POA: Diagnosis present

## 2022-02-05 DIAGNOSIS — I10 Essential (primary) hypertension: Secondary | ICD-10-CM | POA: Diagnosis present

## 2022-02-05 DIAGNOSIS — E2740 Unspecified adrenocortical insufficiency: Secondary | ICD-10-CM | POA: Diagnosis not present

## 2022-02-05 DIAGNOSIS — G4730 Sleep apnea, unspecified: Secondary | ICD-10-CM | POA: Diagnosis present

## 2022-02-05 DIAGNOSIS — G9341 Metabolic encephalopathy: Secondary | ICD-10-CM | POA: Diagnosis present

## 2022-02-05 DIAGNOSIS — Z885 Allergy status to narcotic agent status: Secondary | ICD-10-CM

## 2022-02-05 DIAGNOSIS — U071 COVID-19: Secondary | ICD-10-CM

## 2022-02-05 DIAGNOSIS — Z9221 Personal history of antineoplastic chemotherapy: Secondary | ICD-10-CM

## 2022-02-05 DIAGNOSIS — R112 Nausea with vomiting, unspecified: Secondary | ICD-10-CM | POA: Diagnosis present

## 2022-02-05 DIAGNOSIS — M1990 Unspecified osteoarthritis, unspecified site: Secondary | ICD-10-CM | POA: Diagnosis present

## 2022-02-05 DIAGNOSIS — I4891 Unspecified atrial fibrillation: Secondary | ICD-10-CM | POA: Diagnosis not present

## 2022-02-05 DIAGNOSIS — R011 Cardiac murmur, unspecified: Secondary | ICD-10-CM | POA: Diagnosis present

## 2022-02-05 DIAGNOSIS — Z807 Family history of other malignant neoplasms of lymphoid, hematopoietic and related tissues: Secondary | ICD-10-CM

## 2022-02-05 DIAGNOSIS — K922 Gastrointestinal hemorrhage, unspecified: Secondary | ICD-10-CM | POA: Diagnosis not present

## 2022-02-05 DIAGNOSIS — Z9181 History of falling: Secondary | ICD-10-CM

## 2022-02-05 DIAGNOSIS — Z808 Family history of malignant neoplasm of other organs or systems: Secondary | ICD-10-CM

## 2022-02-05 DIAGNOSIS — I7121 Aneurysm of the ascending aorta, without rupture: Secondary | ICD-10-CM | POA: Diagnosis present

## 2022-02-05 DIAGNOSIS — Z955 Presence of coronary angioplasty implant and graft: Secondary | ICD-10-CM

## 2022-02-05 DIAGNOSIS — I2510 Atherosclerotic heart disease of native coronary artery without angina pectoris: Secondary | ICD-10-CM | POA: Diagnosis present

## 2022-02-05 DIAGNOSIS — K760 Fatty (change of) liver, not elsewhere classified: Secondary | ICD-10-CM | POA: Diagnosis present

## 2022-02-05 DIAGNOSIS — J9601 Acute respiratory failure with hypoxia: Secondary | ICD-10-CM

## 2022-02-05 DIAGNOSIS — M549 Dorsalgia, unspecified: Secondary | ICD-10-CM | POA: Diagnosis present

## 2022-02-05 DIAGNOSIS — J189 Pneumonia, unspecified organism: Secondary | ICD-10-CM | POA: Diagnosis not present

## 2022-02-05 DIAGNOSIS — Z96652 Presence of left artificial knee joint: Secondary | ICD-10-CM | POA: Diagnosis present

## 2022-02-05 DIAGNOSIS — F419 Anxiety disorder, unspecified: Secondary | ICD-10-CM | POA: Diagnosis present

## 2022-02-05 DIAGNOSIS — E441 Mild protein-calorie malnutrition: Secondary | ICD-10-CM | POA: Diagnosis present

## 2022-02-05 DIAGNOSIS — R7989 Other specified abnormal findings of blood chemistry: Secondary | ICD-10-CM | POA: Diagnosis present

## 2022-02-05 DIAGNOSIS — Z87891 Personal history of nicotine dependence: Secondary | ICD-10-CM

## 2022-02-05 DIAGNOSIS — I480 Paroxysmal atrial fibrillation: Secondary | ICD-10-CM

## 2022-02-05 DIAGNOSIS — D61818 Other pancytopenia: Secondary | ICD-10-CM | POA: Diagnosis not present

## 2022-02-05 DIAGNOSIS — Z85820 Personal history of malignant melanoma of skin: Secondary | ICD-10-CM

## 2022-02-05 DIAGNOSIS — J188 Other pneumonia, unspecified organism: Secondary | ICD-10-CM

## 2022-02-05 DIAGNOSIS — K5791 Diverticulosis of intestine, part unspecified, without perforation or abscess with bleeding: Secondary | ICD-10-CM | POA: Diagnosis present

## 2022-02-05 DIAGNOSIS — R161 Splenomegaly, not elsewhere classified: Secondary | ICD-10-CM | POA: Diagnosis present

## 2022-02-05 DIAGNOSIS — K3184 Gastroparesis: Secondary | ICD-10-CM | POA: Diagnosis present

## 2022-02-05 DIAGNOSIS — E869 Volume depletion, unspecified: Secondary | ICD-10-CM

## 2022-02-05 DIAGNOSIS — J90 Pleural effusion, not elsewhere classified: Secondary | ICD-10-CM | POA: Diagnosis present

## 2022-02-05 DIAGNOSIS — Z8719 Personal history of other diseases of the digestive system: Secondary | ICD-10-CM

## 2022-02-05 DIAGNOSIS — K2270 Barrett's esophagus without dysplasia: Secondary | ICD-10-CM | POA: Diagnosis present

## 2022-02-05 DIAGNOSIS — Z7901 Long term (current) use of anticoagulants: Secondary | ICD-10-CM

## 2022-02-05 DIAGNOSIS — Z20822 Contact with and (suspected) exposure to covid-19: Secondary | ICD-10-CM | POA: Diagnosis not present

## 2022-02-05 DIAGNOSIS — Z981 Arthrodesis status: Secondary | ICD-10-CM

## 2022-02-05 DIAGNOSIS — Z862 Personal history of diseases of the blood and blood-forming organs and certain disorders involving the immune mechanism: Secondary | ICD-10-CM

## 2022-02-05 DIAGNOSIS — N529 Male erectile dysfunction, unspecified: Secondary | ICD-10-CM | POA: Diagnosis present

## 2022-02-05 DIAGNOSIS — Z8572 Personal history of non-Hodgkin lymphomas: Secondary | ICD-10-CM

## 2022-02-05 DIAGNOSIS — E876 Hypokalemia: Secondary | ICD-10-CM | POA: Diagnosis present

## 2022-02-05 DIAGNOSIS — R000 Tachycardia, unspecified: Secondary | ICD-10-CM | POA: Diagnosis present

## 2022-02-05 DIAGNOSIS — G8929 Other chronic pain: Secondary | ICD-10-CM | POA: Diagnosis present

## 2022-02-05 DIAGNOSIS — Z79899 Other long term (current) drug therapy: Secondary | ICD-10-CM

## 2022-02-05 DIAGNOSIS — R5381 Other malaise: Secondary | ICD-10-CM

## 2022-02-05 DIAGNOSIS — Z8249 Family history of ischemic heart disease and other diseases of the circulatory system: Secondary | ICD-10-CM

## 2022-02-05 DIAGNOSIS — Z6833 Body mass index (BMI) 33.0-33.9, adult: Secondary | ICD-10-CM

## 2022-02-05 DIAGNOSIS — Z7952 Long term (current) use of systemic steroids: Secondary | ICD-10-CM

## 2022-02-05 LAB — URINALYSIS, ROUTINE W REFLEX MICROSCOPIC
Bilirubin Urine: NEGATIVE
Glucose, UA: NEGATIVE mg/dL
Hgb urine dipstick: NEGATIVE
Ketones, ur: 20 mg/dL — AB
Leukocytes,Ua: NEGATIVE
Nitrite: NEGATIVE
Protein, ur: NEGATIVE mg/dL
Specific Gravity, Urine: 1.036 — ABNORMAL HIGH (ref 1.005–1.030)
pH: 5 (ref 5.0–8.0)

## 2022-02-05 LAB — CBC
HCT: 36 % — ABNORMAL LOW (ref 39.0–52.0)
Hemoglobin: 12.4 g/dL — ABNORMAL LOW (ref 13.0–17.0)
MCH: 31.5 pg (ref 26.0–34.0)
MCHC: 34.4 g/dL (ref 30.0–36.0)
MCV: 91.4 fL (ref 80.0–100.0)
Platelets: 123 10*3/uL — ABNORMAL LOW (ref 150–400)
RBC: 3.94 MIL/uL — ABNORMAL LOW (ref 4.22–5.81)
RDW: 12.5 % (ref 11.5–15.5)
WBC: 3.3 10*3/uL — ABNORMAL LOW (ref 4.0–10.5)
nRBC: 0 % (ref 0.0–0.2)

## 2022-02-05 LAB — BASIC METABOLIC PANEL
Anion gap: 13 (ref 5–15)
BUN: 13 mg/dL (ref 8–23)
CO2: 22 mmol/L (ref 22–32)
Calcium: 9 mg/dL (ref 8.9–10.3)
Chloride: 106 mmol/L (ref 98–111)
Creatinine, Ser: 1.19 mg/dL (ref 0.61–1.24)
GFR, Estimated: >60 mL/min (ref >60)
Glucose, Bld: 94 mg/dL (ref 70–99)
Potassium: 3.4 mmol/L — ABNORMAL LOW (ref 3.5–5.1)
Sodium: 141 mmol/L (ref 135–145)

## 2022-02-05 LAB — HEPATIC FUNCTION PANEL
ALT: 52 U/L — ABNORMAL HIGH (ref 0–44)
AST: 53 U/L — ABNORMAL HIGH (ref 15–41)
Albumin: 3.2 g/dL — ABNORMAL LOW (ref 3.5–5.0)
Alkaline Phosphatase: 46 U/L (ref 38–126)
Bilirubin, Direct: 0.2 mg/dL (ref 0.0–0.2)
Indirect Bilirubin: 0.9 mg/dL (ref 0.3–0.9)
Total Bilirubin: 1.1 mg/dL (ref 0.3–1.2)
Total Protein: 6.3 g/dL — ABNORMAL LOW (ref 6.5–8.1)

## 2022-02-05 LAB — POC OCCULT BLOOD, ED: Fecal Occult Bld: NEGATIVE

## 2022-02-05 LAB — LIPASE, BLOOD: Lipase: 45 U/L (ref 11–51)

## 2022-02-05 LAB — SARS CORONAVIRUS 2 BY RT PCR: SARS Coronavirus 2 by RT PCR: NEGATIVE

## 2022-02-05 MED ORDER — ACETAMINOPHEN 650 MG RE SUPP: 650.0000 mg | Freq: Four times a day (QID) | RECTAL | Status: DC | PRN

## 2022-02-05 MED ORDER — ONDANSETRON HCL 4 MG/2ML IJ SOLN
4.0000 mg | Freq: Once | INTRAMUSCULAR | Status: AC
  Administered 2022-02-05: 4 mg via INTRAVENOUS
  Filled 2022-02-05: qty 2

## 2022-02-05 MED ORDER — HYDRALAZINE HCL 20 MG/ML IJ SOLN: 10.0000 mg | Freq: Four times a day (QID) | INTRAMUSCULAR | Status: DC | PRN

## 2022-02-05 MED ORDER — SODIUM CHLORIDE 0.9 % IV SOLN: INTRAVENOUS | Status: DC

## 2022-02-05 MED ORDER — OXYCODONE HCL 5 MG PO TABS
5.0000 mg | ORAL_TABLET | ORAL | Status: DC | PRN
  Administered 2022-02-06 – 2022-02-07 (×3): 5 mg via ORAL
  Filled 2022-02-05 (×3): qty 1

## 2022-02-05 MED ORDER — LACTATED RINGERS IV BOLUS
1000.0000 mL | Freq: Once | INTRAVENOUS | Status: AC
  Administered 2022-02-05: 1000 mL via INTRAVENOUS

## 2022-02-05 MED ORDER — SODIUM CHLORIDE 0.9 % IV SOLN
3.0000 g | Freq: Four times a day (QID) | INTRAVENOUS | Status: DC
  Administered 2022-02-05: 3 g via INTRAVENOUS
  Filled 2022-02-05: qty 8

## 2022-02-05 MED ORDER — BISACODYL 5 MG PO TBEC: 5.0000 mg | DELAYED_RELEASE_TABLET | Freq: Every day | ORAL | Status: DC | PRN

## 2022-02-05 MED ORDER — DIAZEPAM 5 MG PO TABS
10.0000 mg | ORAL_TABLET | Freq: Four times a day (QID) | ORAL | Status: DC | PRN
  Administered 2022-02-05 – 2022-02-13 (×10): 10 mg via ORAL
  Filled 2022-02-05 (×9): qty 2

## 2022-02-05 MED ORDER — PRUCALOPRIDE SUCCINATE 2 MG PO TABS: 1.0000 | ORAL_TABLET | Freq: Every day | ORAL | Status: DC

## 2022-02-05 MED ORDER — MONTELUKAST SODIUM 10 MG PO TABS
10.0000 mg | ORAL_TABLET | Freq: Every day | ORAL | Status: DC
  Administered 2022-02-06 – 2022-02-14 (×9): 10 mg via ORAL
  Filled 2022-02-05 (×10): qty 1

## 2022-02-05 MED ORDER — RANOLAZINE ER 500 MG PO TB12
500.0000 mg | ORAL_TABLET | Freq: Two times a day (BID) | ORAL | Status: DC
  Administered 2022-02-06 – 2022-02-14 (×17): 500 mg via ORAL
  Filled 2022-02-05 (×17): qty 1

## 2022-02-05 MED ORDER — ALBUTEROL SULFATE (2.5 MG/3ML) 0.083% IN NEBU
2.5000 mg | INHALATION_SOLUTION | Freq: Four times a day (QID) | RESPIRATORY_TRACT | Status: DC
  Administered 2022-02-05: 2.5 mg via RESPIRATORY_TRACT
  Filled 2022-02-05: qty 3

## 2022-02-05 MED ORDER — ONDANSETRON HCL 4 MG/2ML IJ SOLN
4.0000 mg | Freq: Four times a day (QID) | INTRAMUSCULAR | Status: DC | PRN
  Administered 2022-02-05 – 2022-02-07 (×4): 4 mg via INTRAVENOUS
  Filled 2022-02-05 (×4): qty 2

## 2022-02-05 MED ORDER — PSYLLIUM 95 % PO PACK
1.0000 | PACK | Freq: Every day | ORAL | Status: DC
  Administered 2022-02-06 – 2022-02-09 (×4): 1 via ORAL
  Filled 2022-02-05 (×11): qty 1

## 2022-02-05 MED ORDER — SODIUM CHLORIDE 0.9 % IV SOLN
3.0000 g | Freq: Four times a day (QID) | INTRAVENOUS | Status: DC
  Administered 2022-02-06 – 2022-02-09 (×13): 3 g via INTRAVENOUS
  Filled 2022-02-05 (×14): qty 8

## 2022-02-05 MED ORDER — ACETAMINOPHEN 325 MG PO TABS
650.0000 mg | ORAL_TABLET | Freq: Four times a day (QID) | ORAL | Status: DC | PRN
  Administered 2022-02-08 – 2022-02-13 (×6): 650 mg via ORAL
  Filled 2022-02-05 (×6): qty 2

## 2022-02-05 MED ORDER — SENNA 8.6 MG PO TABS
1.0000 | ORAL_TABLET | Freq: Two times a day (BID) | ORAL | Status: DC
  Administered 2022-02-06 – 2022-02-12 (×11): 8.6 mg via ORAL
  Filled 2022-02-05 (×17): qty 1

## 2022-02-05 MED ORDER — METOPROLOL SUCCINATE ER 25 MG PO TB24
25.0000 mg | ORAL_TABLET | Freq: Every day | ORAL | Status: DC
  Administered 2022-02-06 – 2022-02-14 (×7): 25 mg via ORAL
  Filled 2022-02-05 (×9): qty 1

## 2022-02-05 MED ORDER — METOCLOPRAMIDE HCL 5 MG/ML IJ SOLN
10.0000 mg | Freq: Three times a day (TID) | INTRAMUSCULAR | Status: AC
  Administered 2022-02-05 – 2022-02-08 (×9): 10 mg via INTRAVENOUS
  Filled 2022-02-05 (×9): qty 2

## 2022-02-05 MED ORDER — ALBUTEROL SULFATE (2.5 MG/3ML) 0.083% IN NEBU
2.5000 mg | INHALATION_SOLUTION | Freq: Four times a day (QID) | RESPIRATORY_TRACT | Status: DC
  Administered 2022-02-06 (×4): 2.5 mg via RESPIRATORY_TRACT
  Filled 2022-02-05 (×4): qty 3

## 2022-02-05 MED ORDER — METHOCARBAMOL 1000 MG/10ML IJ SOLN: 500.0000 mg | Freq: Four times a day (QID) | INTRAVENOUS | Status: DC | PRN

## 2022-02-05 MED ORDER — METAMUCIL FIBER PO CHEW: 1.0000 | CHEWABLE_TABLET | Freq: Every day | ORAL | Status: DC

## 2022-02-05 MED ORDER — IOHEXOL 300 MG/ML  SOLN
100.0000 mL | Freq: Once | INTRAMUSCULAR | Status: AC | PRN
  Administered 2022-02-05: 100 mL via INTRAVENOUS

## 2022-02-05 NOTE — Progress Notes (Signed)
Pharmacy Antibiotic Note  [name] is a 71 y.o. male for which pharmacy has been consulted for unasyn dosing for  aspiration pna .  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  Plan: Unasyn 3g q6h Trend WBC, Fever, Renal function F/u cultures, clinical course, WBC, fever De-escalate when able  Height: 6' (182.9 cm) Weight: 113 kg (249 lb 1.9 oz) IBW/kg (Calculated) : 77.6  Temp (24hrs), Avg:98.2 F (36.8 C), Min:97.8 F (36.6 C), Max:98.6 F (37 C)  Recent Labs  Lab 02/05/22 1023  WBC 3.3*  CREATININE 1.19    Estimated Creatinine Clearance: 75 mL/min (by C-G formula based on SCr of 1.19 mg/dL).    Allergies  Allergen Reactions   Demerol Other (See Comments)    "Makes me crazy."    Antimicrobials this admission: unasyn 9/6 >>   Microbiology results: Pending  Thank you for allowing pharmacy to be a part of this patient's care.  Lorelei Pont, PharmD, BCPS 02/05/2022 6:22 PM ED Clinical Pharmacist -  [phone]

## 2022-02-05 NOTE — ED Notes (Signed)
Told DrTrifan and PA Norval Gable that the patient uncult card was positive

## 2022-02-05 NOTE — ED Notes (Signed)
This RN called lab to check status of add on labs. Per lab, order not in. Terrence in lab to add order and begin processing lipase and hepatic function at this time.

## 2022-02-05 NOTE — H&P (Signed)
[facility] History and Physical  [name]:[id number] DOB: 1950/08/25 DOA: 02/05/2022 PCP: Redmond School, MD  Admitted from: Home Chief Complaint: Shortness of breath  History of Present Illness: [name] is a 71 y.o. 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 Patient has had Nissen's fundoplication complicated by Barrett's esophagus, gastroparesis.  He has chronic intermittent nausea and vomiting for which he is on Reglan 3 times a day as well as other as needed antinausea medication.  For the last few months, he has more pronounced nausea and vomiting.  He would gag after eating or drinking anything.  He has been slowly getting dehydrated.  He was recently seen by GI Dr. Jenetta Downer and underwent EGD on 8/1.  He was found to have known short segment Barrett's esophagus, erosive gastropathy with stigmata of recent bleeding.  He continued to have symptoms after that as well.  Over the period of last 3 weeks, patient has become significantly dehydrated and weak. About 2 weeks ago, patient tested positive for COVID.  He had mild symptoms only.  He was prescribed Paxlovid but his pharmacy did not have it so he could not take it.   Over the last several days, patient has had nightly fever as well.   8/28, he had a fall sustaining right-sided rib pain.  He was seen in the ED.  Imaging at that time was negative and hence patient was discharged home.  Patient returned to the ED today with complaint of worsening symptoms.  In the ED, patient was afebrile, heart rate 108, blood pressure 144/90.  Per ED physician, on room air is O2 sat was down in the 80s and hence he was put on 2 L oxygen on nasal  cannula. Labs showed WBC count of 3.3, hemoglobin 12.4, platelet 123, potassium 3.4, AST/ALT slightly elevated Urinalysis with clear yellow urine with negative leukocytes, nitrite and rare bacteria.  Urine culture sent. Hemoccult test positive.  COVID PCR negative  CT chest, abdomen pelvis showed new extensive patchy infiltrates are seen in both lungs suggesting multifocal pneumonia, minimal bilateral pleural effusions. Ultrasound abdomen showed early acute changes, normal gallbladder, no gallstones.  CBD 4 mm size.  ED physician discussed with GI about positive fecal occult blood test and the drop in hemoglobin compared to the last value.  Recommended observation overnight.  At the time of my evaluation, patient was lying on bed.  Alert, awake, oriented x3, felt tired.  On low-flow oxygen.  Wife at bedside who helped with most of the history as detailed above.  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   Coronary artery disease    mild by 09/08/13 cath [facility]   GERD (gastroesophageal reflux disease)    barretts esophagus   H/O hiatal hernia    Headache(784.0)    Hypercholesteremia  Hypertension    PONV (postoperative nausea and vomiting)    trouble breathing after last surgery (at [facility])   Shingles    10   Sleep apnea    not tested   Tumor liver    no problems    Past surgical history: Past Surgical History:  Procedure Laterality Date   ABDOMINAL SURGERY     x3   back surgeries     x 9   BACK SURGERY     x8   BIOPSY N/A 03/28/2013   Procedure: Distal Esophageal Biopsy;  Surgeon: Rogene Houston, MD;  Location: [facility];   Service: Endoscopy;  Laterality: N/A;   BIOPSY  05/30/2016   Procedure: BIOPSY;  Surgeon: Rogene Houston, MD;  [facility];  Service: Endoscopy;;   BIOPSY  02/19/2018   Procedure: BIOPSY;  Surgeon: Rogene Houston, MD;  [facility];  Service: Endoscopy;;  Barret's esophagus   BIOPSY  08/29/2020   Procedure: BIOPSY;  Surgeon: Rogene Houston, MD;  [facility];  Service: Endoscopy;;   BIOPSY  12/31/2021   Procedure: BIOPSY;  Surgeon: Harvel Quale, MD;  [facility];  Service: Gastroenterology;;   CARDIAC CATHETERIZATION  2020   CHEST SURGERY     lung incisional hernia for hernia   COLONOSCOPY WITH PROPOFOL N/A 02/19/2018   Procedure: COLONOSCOPY WITH PROPOFOL;  Surgeon: Rogene Houston, MD;  [facility];  Service: Endoscopy;  Laterality: N/A;  1:00   CORONARY ANGIOPLASTY WITH STENT PLACEMENT  2020   2 blockages: 1 stent   ESOPHAGOGASTRODUODENOSCOPY (EGD) WITH PROPOFOL N/A 03/28/2013   Procedure: ESOPHAGOGASTRODUODENOSCOPY (EGD) WITH PROPOFOL;  Surgeon: Rogene Houston, MD;  Location: [facility];  Service: Endoscopy;  Laterality: N/A;  GE junction @ 38, proximal  margin '@37'$    ESOPHAGOGASTRODUODENOSCOPY (EGD) WITH PROPOFOL N/A 05/30/2016   Procedure: ESOPHAGOGASTRODUODENOSCOPY (EGD) WITH PROPOFOL;  Surgeon: Rogene Houston, MD;  [facility];  Service: Endoscopy;  Laterality: N/A;  955   ESOPHAGOGASTRODUODENOSCOPY (EGD) WITH PROPOFOL N/A 02/19/2018   Procedure: ESOPHAGOGASTRODUODENOSCOPY (EGD) WITH PROPOFOL;  Surgeon: Rogene Houston, MD;  [facility];  Service: Endoscopy;  Laterality: N/A;   ESOPHAGOGASTRODUODENOSCOPY (EGD) WITH PROPOFOL N/A 08/29/2020   Procedure: ESOPHAGOGASTRODUODENOSCOPY (EGD) WITH PROPOFOL;  Surgeon: Rogene Houston, MD;  [facility];  Service: Endoscopy;  Laterality: N/A;  AM   ESOPHAGOGASTRODUODENOSCOPY (EGD) WITH PROPOFOL N/A 12/31/2021   Procedure:  ESOPHAGOGASTRODUODENOSCOPY (EGD) WITH PROPOFOL;  Surgeon: Harvel Quale, MD;  [facility];  Service: Gastroenterology;  Laterality: N/A;  100 ASA 2   HEMORRHOID SURGERY     x2   HERNIA REPAIR     HIATAL HERNIA REPAIR     JOINT REPLACEMENT     lft knee   KNEE ARTHROPLASTY     lft   KNEE ARTHROSCOPY Right    LEG SURGERY     x4 crushed    LITHOTRIPSY     MELANOMA EXCISION     lft abdomen   NECK SURGERY  2012   POLYPECTOMY  02/19/2018   Procedure: POLYPECTOMY;  Surgeon: Rogene Houston, MD;  [facility];  Service: Endoscopy;;  proximal transverse colon (CB x1), descending colon (CBx3)   POSTERIOR CERVICAL FUSION/FORAMINOTOMY  08/18/2011   Procedure: POSTERIOR CERVICAL FUSION/FORAMINOTOMY LEVEL 3;  Surgeon: Ophelia Charter, MD;  [facility];  Service: Neurosurgery;  Laterality: N/A;  Cervical three-four, four-five, five-six posterior  cervical  fusion with instrumentation; Right cervical three-four, four-five laminotomy   REVISION TOTAL HIP ARTHROPLASTY Left    SHOULDER ARTHROSCOPY     rt bith defect , spur, rotator cuff   SPINAL CORD STIMULATOR IMPLANT     rt hip   SPINAL CORD STIMULATOR INSERTION N/A 11/03/2016   Procedure: LUMBAR SPINAL CORD STIMULATOR REVISION;  Surgeon: Newman Pies, MD;  Location: [facility];  Service: Neurosurgery;  Laterality: N/A;   THYROID LOBECTOMY     rt    Social History:  reports that he quit smoking about 41 years ago. His smoking use included cigarettes. He has a 40.00 pack-year smoking history. He has never used smokeless tobacco. He reports that he does not currently use alcohol. He reports that he does not use drugs.  Allergies:  Allergies  Allergen Reactions   Demerol Other (See Comments)    "Makes me crazy."   Demerol   Family history:  Family History  Problem Relation Age of Onset   Heart disease Father    Aneurysm Mother    Aneurysm Sister    Heart disease Paternal Grandmother    Heart  disease Paternal Grandfather    Heart disease Maternal Grandfather    Heart disease Brother        heart transplant   Aneurysm Other        pt states several members have aortic aneurysm   Skin cancer Other        several members   Lymphoma Brother    Aneurysm Maternal Grandmother      Home Meds: Prior to Admission medications   Medication Sig Start Date End Date Taking? Authorizing Provider  Bioflavonoid Products (ESTER-C) 500-550 MG TABS Take 1 tablet by mouth daily.    [provider]  Calcium Carbonate-Vitamin D 600-10 MG-MCG TABS Take 1 tablet by mouth 2 (two) times daily.     [provider]  cyclobenzaprine (FLEXERIL) 10 MG tablet Take 1 tablet (10 mg total) by mouth 3 (three) times daily as needed for muscle spasms. Patient taking differently: Take 10 mg by mouth daily. 11/05/16   Newman Pies, MD  dexlansoprazole (DEXILANT) 60 MG capsule Take 60 mg by mouth daily.     [provider]  diazepam (VALIUM) 10 MG tablet Take 10 mg by mouth 4 (four) times daily as needed (for back pain).    [provider]  fexofenadine (ALLEGRA) 180 MG tablet Take 180 mg by mouth every evening.    [provider]  fish oil-omega-3 fatty acids 1000 MG capsule Take 1 g by mouth 2 (two) times daily.    [provider]  hydrocortisone (CORTEF) 10 MG tablet Take 10-20 mg by mouth See admin instructions. Take 10 mg every evening and 20 mg every morning 12/04/20   [provider]  lisinopril (PRINIVIL,ZESTRIL) 10 MG tablet Take 10 mg by mouth daily.    [provider]  Metamucil Fiber CHEW Chew 1 each by mouth daily.    [provider]  metoCLOPramide (REGLAN) 10 MG tablet Take 1 tablet by mouth in the morning and at bedtime. 08/03/20   [provider]  metoprolol succinate (TOPROL-XL) 25 MG 24 hr tablet Take 1 tablet (25 mg total) by mouth daily. 01/27/22   Noemi Chapel, MD  montelukast (SINGULAIR) 10 MG tablet Take 10  mg by mouth daily.     [provider]  Multiple Vitamin (MULITIVITAMIN WITH MINERALS) TABS Take 1 tablet by mouth daily.  [provider]  ondansetron (ZOFRAN) 8 MG tablet Take 1 tablet (8 mg total) by mouth every 8 (eight) hours as needed for vomiting or nausea. 11/18/21   Harvel Quale, MD  oxyCODONE-acetaminophen (PERCOCET) 10-325 MG tablet Take 1 tablet by mouth every 6 (six) hours as needed for pain.    [provider]  PAXLOVID, 300/100, 20 x 150 MG & 10 x '100MG'$  TBPK Take by mouth. 01/24/22   [provider]  Plecanatide (TRULANCE) 3 MG TABS Take 1 tablet by mouth daily. Patient not taking: Reported on 12/20/2021 11/24/21   Montez Morita, Quillian Quince, MD  polyethylene glycol powder [facility]) 17 GM/SCOOP powder Take 17 g by mouth daily. Patient not taking: Reported on 11/18/2021 06/25/21   Rogene Houston, MD  Prucalopride Succinate (MOTEGRITY) 2 MG TABS Take 1 tablet (2 mg total) by mouth daily. 12/31/21   Harvel Quale, MD  ranolazine (RANEXA) 500 MG 12 hr tablet Take 500 mg by mouth 2 (two) times daily. 04/23/20   [provider]  rivaroxaban (XARELTO) 20 MG TABS tablet Take 1 tablet (20 mg total) by mouth daily with supper. 05/29/21   Manuella Ghazi, Pratik D, DO  rosuvastatin (CRESTOR) 10 MG tablet Take 5 mg by mouth every evening.    [provider]  sildenafil (VIAGRA) 100 MG tablet Take 50 mg by mouth as needed for erectile dysfunction. 02/10/20   [provider]    Physical Exam: Vitals:   02/05/22 1230 02/05/22 1345 02/05/22 1400 02/05/22 1445  BP: 137/70 139/78  (!) 147/105  Pulse: 81 67  69  Resp: (!) 21 (!) 22  17  Temp:   98.6 F (37 C)   TempSrc:   Oral   SpO2: 96% 96%  97%  Weight:      Height:       Wt Readings from Last 3 Encounters:  02/05/22 113 kg  01/27/22 113.4 kg  12/30/21 115.6 kg   Body mass index is 33.79 kg/m.  General exam: Pleasant, elderly obese Caucasian male.   Tired Skin: No rashes, lesions or ulcers. HEENT: Atraumatic, normocephalic, no obvious bleeding Lungs: Diminished air entry in both bases.  No crackles CVS: Regular rate and rhythm, no murmur GI/Abd soft, distended from obesity.  Midline scar from previous surgery.  Bowel sound present CNS: Alert, awake, oriented x3 Psychiatry: Sad affect Extremities: No pedal edema, no calf tenderness     Consult Orders  (From admission, onward)           Start     Ordered   02/05/22 1728  Consult to respiratory care treatment (RT)  (COPD / Pneumonia / Cellulitis / Lower Extremity Wound)  Once       Provider:  (Not yet assigned)  Question:  Reason for Consult?  Answer:  Evaluation   02/05/22 1730   02/05/22 1635  Consult to hospitalist  Once       Provider:  (Not yet assigned)  Question Answer Comment  Place call to: [facility]   Reason for Consult Admit      02/05/22 1634            Labs on Admission:   CBC: Recent Labs  Lab 02/05/22 1023  WBC 3.3*  HGB 12.4*  HCT 36.0*  MCV 91.4  PLT 123*    Basic Metabolic Panel: Recent Labs  Lab 02/05/22 1023  NA 141  K 3.4*  CL 106  CO2 22  GLUCOSE 94  BUN 13  CREATININE  1.19  CALCIUM 9.0    Liver Function Tests: Recent Labs  Lab 02/05/22 1023  AST 53*  ALT 52*  ALKPHOS 46  BILITOT 1.1  PROT 6.3*  ALBUMIN 3.2*   Recent Labs  Lab 02/05/22 1023  LIPASE 45   No results for input(s): "AMMONIA" in the last 168 hours.  Cardiac Enzymes: No results for input(s): "CKTOTAL", "CKMB", "CKMBINDEX", "TROPONINI" in the last 168 hours.  BNP (last 3 results) No results for input(s): "BNP" in the last 8760 hours.  ProBNP (last 3 results) No results for input(s): "PROBNP" in the last 8760 hours.  CBG: No results for input(s): "GLUCAP" in the last 168 hours.  Lipase     Component Value Date/Time   LIPASE 45 02/05/2022 1023     Urinalysis    Component Value Date/Time   COLORURINE YELLOW 02/05/2022 1530    APPEARANCEUR CLEAR 02/05/2022 1530   LABSPEC 1.036 (H) 02/05/2022 1530   PHURINE 5.0 02/05/2022 1530   GLUCOSEU NEGATIVE 02/05/2022 1530   HGBUR NEGATIVE 02/05/2022 1530   BILIRUBINUR NEGATIVE 02/05/2022 1530   KETONESUR 20 (A) 02/05/2022 1530   PROTEINUR NEGATIVE 02/05/2022 1530   UROBILINOGEN 0.2 07/16/2012 1829   NITRITE NEGATIVE 02/05/2022 1530   LEUKOCYTESUR NEGATIVE 02/05/2022 1530     Drugs of Abuse  No results found for: "LABOPIA", "COCAINSCRNUR", "LABBENZ", "AMPHETMU", "THCU", "LABBARB"    Radiological Exams on Admission: US Abdomen Limited RUQ (LIVER/GB)  Result Date: 02/05/2022 CLINICAL DATA:  Right upper quadrant abdominal pain. History of cirrhosis. EXAM: ULTRASOUND ABDOMEN LIMITED RIGHT UPPER QUADRANT COMPARISON:  CT the chest, abdomen and pelvis-earlier same day FINDINGS: Gallbladder: Normal sonographic appearance of the gallbladder. No gallbladder wall thickening or pericholecystic stranding. No echogenic gallstones or biliary sludge. Negative sonographic Murphy's sign. Common bile duct: Diameter: Normal in size measuring 4 mm in diameter Liver: Normal echogenicity of the hepatic parenchyma however there is questioned mild nodularity of the hepatic contour (representative image 26). No discrete hepatic lesions. No intrahepatic biliary dilatation. Portal vein is patent on color Doppler imaging with normal direction of blood flow towards the liver. Other: None. IMPRESSION: Suspected mild nodularity of the hepatic contour with preservation of the hepatic parenchymal echogenicity, nonspecific though could be seen in the setting of early cirrhotic change. Correlation with LFTs is advised. Electronically Signed   By: Sandi Mariscal M.D.   On: 02/05/2022 15:20   CT CHEST ABDOMEN PELVIS W CONTRAST  Result Date: 02/05/2022 CLINICAL DATA:  Abdominal pain, weakness EXAM: CT CHEST, ABDOMEN, AND PELVIS WITH CONTRAST TECHNIQUE: Multidetector CT imaging of the chest, abdomen and pelvis was  performed following the standard protocol during bolus administration of intravenous contrast. RADIATION DOSE REDUCTION: This exam was performed according to the departmental dose-optimization program which includes automated exposure control, adjustment of the mA and/or kV according to patient size and/or use of iterative reconstruction technique. CONTRAST:  1105m OMNIPAQUE IOHEXOL 300 MG/ML  SOLN COMPARISON:  Previous studies including the CT abdomen and pelvis done on 01/27/2022 FINDINGS: CT CHEST FINDINGS Cardiovascular: There is homogeneous enhancement in thoracic aorta. 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 There are no intraluminal filling defects in central pulmonary artery branches. Peripheral pulmonary artery branches are not adequately visualized for evaluation. Coronary artery calcifications are seen. Mediastinum/Nodes: Unremarkable. Lungs/Pleura: Fairly extensive new patchy ground-glass infiltrates are noted in both lungs. There is minimal bilateral pleural effusions. There is no pneumothorax. Musculoskeletal: There is surgical fusion in cervical and upper thoracic spine. Pain control lead  is seen in thoracic spinal canal. CT ABDOMEN PELVIS FINDINGS Hepatobiliary: There is fatty infiltration. Gallbladder is distended. There is no wall thickening. There is no fluid around the gallbladder. There is no dilation of bile ducts. Pancreas: No focal abnormalities are seen. Spleen: Spleen is enlarged measuring 16 cm in maximum diameter. Adrenals/Urinary Tract: Adrenals are unremarkable. There is no hydronephrosis. There is 4.3 cm parapelvic cyst in the upper pole of left kidney. No follow-up imaging is recommended. There are no renal or ureteral stones. Urinary bladder is unremarkable. Stomach/Bowel: Small hiatal hernia is seen. There are surgical clips in the posterior mediastinum adjacent to the hiatal hernia. Small bowel loops are not dilated. Appendix is not dilated.  Scattered diverticula are seen in colon without signs of focal acute diverticulitis. Vascular/Lymphatic: Arterial calcifications are seen. Reproductive: Unremarkable. Other: There is no ascites or pneumoperitoneum. There is previous ventral hernia repair. Umbilical hernia containing fat is seen. Bilateral inguinal hernias containing fat are seen, larger on the right side. Musculoskeletal: There is previous left hip arthroplasty. Lumbar surgical fusion is seen. Decrease in height of body of T4 vertebra has not changed. No recent fracture is seen. IMPRESSION: New extensive patchy infiltrates are seen in both lungs suggesting multifocal pneumonia. Minimal bilateral pleural effusions. Coronary artery disease. 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 Recommend annual imaging followup by CTA or MRA. This recommendation follows 2010 ACCF/AHA/AATS/ACR/ASA/SCA/SCAI/SIR/STS/SVM Guidelines for the Diagnosis and Management of Patients with Thoracic Aortic Disease. Circulation. 2010; 121: A213-Y865. Aortic aneurysm NOS (ICD10-I71.9) There is no evidence of intestinal obstruction or pneumoperitoneum. Appendix is not dilated. There is no hydronephrosis. Fatty liver. Small hiatal hernia. Splenomegaly. There is parapelvic cyst in the left kidney. Diverticulosis of colon without signs of focal diverticulitis. Other findings as described in the body of the report. Electronically Signed   By: Elmer Picker M.D.   On: 02/05/2022 13:26     ------------------------------------------------------------------------------------------------------ Assessment/Plan: Principal Problem:   GI bleeding  Intractable nausea, vomiting History of Nissen fundoplication complicated by Barrett's esophagus Patient seems to be having a flareup of his chronic intermittent nausea vomiting.  He is significantly weak and dehydrated. We will start him on IV fluid, IV Reglan (QTc 465 ms) GI called.  N.p.o. after midnight for  possible intervention tomorrow For now, patient wants to try regular food.  Multifocal pneumonia -suspect aspiration Recent COVID infection I suspect patient has chronic aspiration due to chronic abdominal nausea vomiting.  Wife reports nightly fever last few days. CT chest read as multifocal pneumonia Suspected aspiration pneumonia.  The radiology finding may be also partially secondary to recent COVID infection.   Obtain procalcitonin level. Monitor off antibiotics at this time. Recent Labs  Lab 02/05/22 1023  WBC 3.3*   Acute respiratory failure with hypoxia Not on supplemental oxygen at home.  O2 sat between 85 to 90% on ambulation in the ED.  Currently requiring 2 L Wean down as tolerated.  ?  Upper GI bleeding Hemoglobin close to 15.  Presented with hemoglobin of 12.4 and Hemoccult positive. On Xarelto for DVT PE.  We will keep it on hold at this time. Start Protonix IV twice daily GI to see Monitor hemoglobin Recent Labs    05/06/21 0406 06/19/21 0850 10/23/21 0952 01/27/22 1056 02/05/22 1023  HGB 13.9 15.5 15.6 15.7 12.4*  MCV 96.2 96.8 93.5 93.0 91.4   Essential hypertension PTA on metoprolol, lisinopril 10 mg daily Resume metoprolol.  Keep lisinopril on hold  mild CAD, ascending aortic aneurysm HLD  PTA on metoprolol, Ranexa, Xarelto, Crestor Resume metoprolol and Ranexa.  Keep child Entresto on hold  History of lymphoma, melanoma With oncologist at [facility].  Last chemotherapy was in 2022.  Recently followed-up.  Currently remission.  DVT/PE December 2022  on Xarelto, I presume he is on it for lifelong in the setting of malignancy  fatty liver Ultrasound abdomen showed early acute changes, normal gallbladder, no gallstones.  CBD 4 mm size. Mildly elevated LFTs.  Continue to trend. Recent Labs  Lab 02/05/22 1023  AST 53*  ALT 52*  ALKPHOS 46  BILITOT 1.1  PROT 6.3*  ALBUMIN 3.2*   IBS-C PTA on Motegrity, MiraLAX, Metamucil  Anxiety Valium as  needed  Med reconciliation pending. ?  Hydrocortisone, ?  Flexeril  Mobility -Encourage ambulation  Goals of care - -  Code Status: Full Code    Diet:  Diet Order             Diet NPO time specified  Diet effective midnight           Diet regular Room service appropriate? Yes; Fluid consistency: Thin  Diet effective now                  DVT prophylaxis:  SCDs Start: 02/05/22 1729   Antimicrobials: Unasyn Fluid: NS at 72 mill per hour Consultants: GI Family Communication: Wife at bedside Dispo: The patient is from: Home              Anticipated d/c is to: Pending clinical course  ------------------------------------------------------------------------------------- Severity of Illness: The appropriate patient status for this patient is OBSERVATION. Observation status is judged to be reasonable and necessary in order to provide the required intensity of service to ensure the patient's safety. The patient's presenting symptoms, physical exam findings, and initial radiographic and laboratory data in the context of their medical condition is felt to place them at decreased risk for further clinical deterioration. Furthermore, it is anticipated that the patient will be medically stable for discharge from the hospital within 2 midnights of admission.   Signed, Terrilee Croak, MD [facility] 02/05/2022

## 2022-02-05 NOTE — ED Provider Notes (Signed)
[facility] EMERGENCY DEPARTMENT Provider Note   CSN: [id number] Arrival date & time: 02/05/22  3845     History Chief Complaint  Patient presents with   Weakness    [name] is a 71 y.o. male with history of Barrett's esophagus, lymphoma, gastric hemorrhage, PE, thrombocytopenia, hypertension, presents to the emergency department for evaluation of vomiting for the past 3 weeks.  Patient has a history of gastroparesis as well and sees a GI provider in [place].  He is also being seen by [facility] for potential "G poem stent" to be placed.  Daughter and wife reported the majority of the history.  They report that he usually has vomiting with his condition however given that he has been having it for 3 weeks, he usually needs IV fluids and antiemetics.  He denies any coffee-ground emesis, hematemesis, dysuria, hematuria, melena or hematochezia.  Denies any fevers as well.  Patient reports he still been urinating multiple times a day as well as having bowel movements.  Reports he is still able to pass gas as well.  Denies any chest pain or shortness of breath.  Patient reports he did have COVID 2 weeks ago.  Additionally, he still reports some right-sided chest pain from his fall over a week ago.   Weakness Associated symptoms: nausea and vomiting   Associated symptoms: no abdominal pain, no chest pain, no diarrhea, no fever and no shortness of breath        Home Medications Prior to Admission medications   Medication Sig Start Date End Date Taking? Authorizing Provider  Bioflavonoid Products (ESTER-C) 500-550 MG TABS Take 1 tablet by mouth daily.    [provider]  Calcium Carbonate-Vitamin D 600-10 MG-MCG TABS Take 1 tablet by mouth 2 (two) times daily.     [provider]  cyclobenzaprine (FLEXERIL) 10 MG tablet Take 1 tablet (10 mg total) by mouth 3 (three) times daily as needed for muscle spasms. Patient taking differently: Take 10 mg by mouth  daily. 11/05/16   Newman Pies, MD  dexlansoprazole (DEXILANT) 60 MG capsule Take 60 mg by mouth daily.     [provider]  diazepam (VALIUM) 10 MG tablet Take 10 mg by mouth 4 (four) times daily as needed (for back pain).    [provider]  fexofenadine (ALLEGRA) 180 MG tablet Take 180 mg by mouth every evening.    [provider]  fish oil-omega-3 fatty acids 1000 MG capsule Take 1 g by mouth 2 (two) times daily.    [provider]  hydrocortisone (CORTEF) 10 MG tablet Take 10-20 mg by mouth See admin instructions. Take 10 mg every evening and 20 mg every morning 12/04/20   [provider]  lisinopril (PRINIVIL,ZESTRIL) 10 MG tablet Take 10 mg by mouth daily.    [provider]  Metamucil Fiber CHEW Chew 1 each by mouth daily.    [provider]  metoCLOPramide (REGLAN) 10 MG tablet Take 1 tablet by mouth in the morning and at bedtime. 08/03/20   [provider]  metoprolol succinate (TOPROL-XL) 25 MG 24 hr tablet Take 1 tablet (25 mg total) by mouth daily. 01/27/22   Noemi Chapel, MD  montelukast (SINGULAIR) 10 MG tablet Take 10 mg by mouth daily.     [provider]  Multiple Vitamin (MULITIVITAMIN WITH MINERALS) TABS Take 1 tablet by mouth daily.    [provider]  ondansetron (ZOFRAN) 8 MG tablet Take 1 tablet (8 mg  total) by mouth every 8 (eight) hours as needed for vomiting or nausea. 11/18/21   Harvel Quale, MD  oxyCODONE-acetaminophen (PERCOCET) 10-325 MG tablet Take 1 tablet by mouth every 6 (six) hours as needed for pain.    [provider]  PAXLOVID, 300/100, 20 x 150 MG & 10 x '100MG'$  TBPK Take by mouth. 01/24/22   [provider]  Plecanatide (TRULANCE) 3 MG TABS Take 1 tablet by mouth daily. Patient not taking: Reported on 12/20/2021 11/24/21   Montez Morita, Quillian Quince, MD  polyethylene glycol powder [facility]) 17 GM/SCOOP powder Take 17 g by mouth  daily. Patient not taking: Reported on 11/18/2021 06/25/21   Rogene Houston, MD  Prucalopride Succinate (MOTEGRITY) 2 MG TABS Take 1 tablet (2 mg total) by mouth daily. 12/31/21   Harvel Quale, MD  ranolazine (RANEXA) 500 MG 12 hr tablet Take 500 mg by mouth 2 (two) times daily. 04/23/20   [provider]  rivaroxaban (XARELTO) 20 MG TABS tablet Take 1 tablet (20 mg total) by mouth daily with supper. 05/29/21   Manuella Ghazi, Pratik D, DO  rosuvastatin (CRESTOR) 10 MG tablet Take 5 mg by mouth every evening.    [provider]  sildenafil (VIAGRA) 100 MG tablet Take 50 mg by mouth as needed for erectile dysfunction. 02/10/20   [provider]      Allergies    Demerol    Review of Systems   Review of Systems  Constitutional:  Positive for fatigue. Negative for chills and fever.  Respiratory:  Negative for shortness of breath.   Cardiovascular:  Negative for chest pain.  Gastrointestinal:  Positive for nausea and vomiting. Negative for abdominal pain, anal bleeding, blood in stool, constipation and diarrhea.  Neurological:  Positive for weakness.    Physical Exam Updated Vital Signs BP (!) 147/105   Pulse 69   Temp 98.6 F (37 C) (Oral)   Resp 17   Ht 6' (1.829 m)   Wt 113 kg   SpO2 97%   BMI 33.79 kg/m  Physical Exam Constitutional:      Appearance: Normal appearance. He is not ill-appearing or toxic-appearing.     Comments: Uncomfortable, but nontoxic-appearing  HENT:     Head: Normocephalic and atraumatic.     Mouth/Throat:     Mouth: Mucous membranes are dry.     Comments: Dry, cracked lips Eyes:     General: No scleral icterus. Cardiovascular:     Rate and Rhythm: Normal rate and regular rhythm.  Pulmonary:     Effort: Pulmonary effort is normal.     Breath sounds: Normal breath sounds.     Comments: Right-sided lower lateral chest tenderness/right upper quadrant abdominal tenderness.  No bruising or step-offs noted.  No deformities  noted.  No overlying skin changes noted. Chest:     Chest wall: Tenderness present.  Abdominal:     General: Abdomen is flat. Bowel sounds are normal.     Palpations: Abdomen is soft.     Tenderness: There is no guarding or rebound.     Hernia: No hernia is present.     Comments: No hernias palpated with patient lying in the supine position.  Musculoskeletal:        General: No deformity.     Cervical back: Normal range of motion.  Skin:    General: Skin is warm and dry.     Capillary Refill: Capillary refill takes 2 to 3 seconds.     Coloration:  Skin is not jaundiced.  Neurological:     General: No focal deficit present.     Mental Status: He is alert. Mental status is at baseline.     Motor: No weakness.     Comments: Patient moving all extremities     ED Results / Procedures / Treatments   Labs (all labs ordered are listed, but only abnormal results are displayed) Labs Reviewed  BASIC METABOLIC PANEL - Abnormal; Notable for the following components:      Result Value   Potassium 3.4 (*)    All other components within normal limits  CBC - Abnormal; Notable for the following components:   WBC 3.3 (*)    RBC 3.94 (*)    Hemoglobin 12.4 (*)    HCT 36.0 (*)    Platelets 123 (*)    All other components within normal limits  URINALYSIS, ROUTINE W REFLEX MICROSCOPIC - Abnormal; Notable for the following components:   Specific Gravity, Urine 1.036 (*)    Ketones, ur 20 (*)    All other components within normal limits  HEPATIC FUNCTION PANEL - Abnormal; Notable for the following components:   Total Protein 6.3 (*)    Albumin 3.2 (*)    AST 53 (*)    ALT 52 (*)    All other components within normal limits  SARS CORONAVIRUS 2 BY RT PCR  LIPASE, BLOOD  POC OCCULT BLOOD, ED  TYPE AND SCREEN    EKG EKG Interpretation  Date/Time:  Wednesday February 05 2022 12:23:15 EDT Ventricular Rate:  83 PR Interval:  162 QRS Duration: 99 QT Interval:  395 QTC  Calculation: 465 R Axis:   -55 Text Interpretation: Sinus rhythm Left anterior fascicular block Low voltage, precordial leads Abnormal R-wave progression, early transition Consider anterior infarct Confirmed by Octaviano Glow [phone]) on 02/05/2022 12:29:39 PM  Radiology US Abdomen Limited RUQ (LIVER/GB)  Result Date: 02/05/2022 CLINICAL DATA:  Right upper quadrant abdominal pain. History of cirrhosis. EXAM: ULTRASOUND ABDOMEN LIMITED RIGHT UPPER QUADRANT COMPARISON:  CT the chest, abdomen and pelvis-earlier same day FINDINGS: Gallbladder: Normal sonographic appearance of the gallbladder. No gallbladder wall thickening or pericholecystic stranding. No echogenic gallstones or biliary sludge. Negative sonographic Murphy's sign. Common bile duct: Diameter: Normal in size measuring 4 mm in diameter Liver: Normal echogenicity of the hepatic parenchyma however there is questioned mild nodularity of the hepatic contour (representative image 26). No discrete hepatic lesions. No intrahepatic biliary dilatation. Portal vein is patent on color Doppler imaging with normal direction of blood flow towards the liver. Other: None. IMPRESSION: Suspected mild nodularity of the hepatic contour with preservation of the hepatic parenchymal echogenicity, nonspecific though could be seen in the setting of early cirrhotic change. Correlation with LFTs is advised. Electronically Signed   By: Sandi Mariscal M.D.   On: 02/05/2022 15:20   CT CHEST ABDOMEN PELVIS W CONTRAST  Result Date: 02/05/2022 CLINICAL DATA:  Abdominal pain, weakness EXAM: CT CHEST, ABDOMEN, AND PELVIS WITH CONTRAST TECHNIQUE: Multidetector CT imaging of the chest, abdomen and pelvis was performed following the standard protocol during bolus administration of intravenous contrast. RADIATION DOSE REDUCTION: This exam was performed according to the departmental dose-optimization program which includes automated exposure control, adjustment of the mA and/or kV according  to patient size and/or use of iterative reconstruction technique. CONTRAST:  157m OMNIPAQUE IOHEXOL 300 MG/ML  SOLN COMPARISON:  Previous studies including the CT abdomen and pelvis done on 01/27/2022 FINDINGS: CT CHEST FINDINGS Cardiovascular: There is homogeneous enhancement  in thoracic aorta. 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 There are no intraluminal filling defects in central pulmonary artery branches. Peripheral pulmonary artery branches are not adequately visualized for evaluation. Coronary artery calcifications are seen. Mediastinum/Nodes: Unremarkable. Lungs/Pleura: Fairly extensive new patchy ground-glass infiltrates are noted in both lungs. There is minimal bilateral pleural effusions. There is no pneumothorax. Musculoskeletal: There is surgical fusion in cervical and upper thoracic spine. Pain control lead is seen in thoracic spinal canal. CT ABDOMEN PELVIS FINDINGS Hepatobiliary: There is fatty infiltration. Gallbladder is distended. There is no wall thickening. There is no fluid around the gallbladder. There is no dilation of bile ducts. Pancreas: No focal abnormalities are seen. Spleen: Spleen is enlarged measuring 16 cm in maximum diameter. Adrenals/Urinary Tract: Adrenals are unremarkable. There is no hydronephrosis. There is 4.3 cm parapelvic cyst in the upper pole of left kidney. No follow-up imaging is recommended. There are no renal or ureteral stones. Urinary bladder is unremarkable. Stomach/Bowel: Small hiatal hernia is seen. There are surgical clips in the posterior mediastinum adjacent to the hiatal hernia. Small bowel loops are not dilated. Appendix is not dilated. Scattered diverticula are seen in colon without signs of focal acute diverticulitis. Vascular/Lymphatic: Arterial calcifications are seen. Reproductive: Unremarkable. Other: There is no ascites or pneumoperitoneum. There is previous ventral hernia repair. Umbilical hernia containing fat  is seen. Bilateral inguinal hernias containing fat are seen, larger on the right side. Musculoskeletal: There is previous left hip arthroplasty. Lumbar surgical fusion is seen. Decrease in height of body of T4 vertebra has not changed. No recent fracture is seen. IMPRESSION: New extensive patchy infiltrates are seen in both lungs suggesting multifocal pneumonia. Minimal bilateral pleural effusions. Coronary artery disease. 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 Recommend annual imaging followup by CTA or MRA. This recommendation follows 2010 ACCF/AHA/AATS/ACR/ASA/SCA/SCAI/SIR/STS/SVM Guidelines for the Diagnosis and Management of Patients with Thoracic Aortic Disease. Circulation. 2010; 121: Q947-M546. Aortic aneurysm NOS (ICD10-I71.9) There is no evidence of intestinal obstruction or pneumoperitoneum. Appendix is not dilated. There is no hydronephrosis. Fatty liver. Small hiatal hernia. Splenomegaly. There is parapelvic cyst in the left kidney. Diverticulosis of colon without signs of focal diverticulitis. Other findings as described in the body of the report. Electronically Signed   By: Elmer Picker M.D.   On: 02/05/2022 13:26    Procedures Procedures   Medications Ordered in ED Medications  lactated ringers bolus 1,000 mL (0 mLs Intravenous Stopped 02/05/22 1454)  ondansetron (ZOFRAN) injection 4 mg (4 mg Intravenous Given 02/05/22 1220)  iohexol (OMNIPAQUE) 300 MG/ML solution 100 mL (100 mLs Intravenous Contrast Given 02/05/22 1301)    ED Course/ Medical Decision Making/ A&P Clinical Course as of 02/05/22 1632  Wed Feb 05, 2022  1419 But this is a 71 year old male presented to emergency department with fatigue and weakness.  He reports he tested positive for COVID about 2 weeks ago, with his wife positive in the house 3 weeks ago.  He has some shortness of breath.  Does not wear oxygen at home.  Also has had a very poor appetite.  He has a complicated GI history and has a  GI appointment coming up.  On exam today the patient was requiring 1 to 2 L nasal cannula.  His COVID PCR is negative today, which may be a false negative for an accurate swab.  He is likely no longer contagious due to the timeline of his COVID symptoms, but anticipate this is the cause of  his pulmonary difficulties.  CT scan shows a "multifocal pneumonia pattern" which is consistent with viral COVID illness.  Doubt sepsis or bacterial infection.  Patient is also on Xarelto for history of blood clots.  He has been compliant with this medicine.  Hemoccult was positive today (lab reports there was an error documented as negative).  His hemoglobin count does show an approximate 3 g drop from his baseline levels, and also from his recent track 9 days ago.  Anticipate he would likely need observation admission for hemoglobin trending.  I do not see an indication for antibiotics at this time.  Please note that there was some gallbladder distention on the CT abdomen, and he endorses some generalized epigastric and right-sided abdominal pain, with a negative Murphy sign on exam.  I think it is reasonable to obtain a gallbladder ultrasound as well. [MT]  1421 Supplemental history is provided by the patient's daughter and wife at bedside. [MT]    Clinical Course User Index [MT] Trifan, Carola Rhine, MD                           Medical Decision Making Amount and/or Complexity of Data Reviewed Labs: ordered. Radiology: ordered.  Risk Prescription drug management. Decision regarding hospitalization.   71 year old male presents emerged department for evaluation of vomiting, fatigue, and generalized weakness.  Differential diagnosis includes but is not limited to dehydration, electrolyte abnormality, viral illness, anemia, COVID, gastroenteritis, small bowel obstruction, gastroparesis.  Vital signs show slight elevation of blood pressure and patient satting 88% with no increased work of breathing.  Physical exam as  noted above.  Given the patient has trauma to his chest and is hypoxic at 88%, will order CT with chest and add on CT abdomen pelvis as well.  I independently reviewed and interpreted the patient's labs.  Urinalysis shows ketones but concentrated urine otherwise normal.  Normal lipase.  BMP shows slightly decreased potassium at 3.4 otherwise no electrolyte abnormality.  Normal creatinine function.  CBC shows mild leukopenia at 3.3.  Patient has had a greater than 3 g drop in the past 9 days with his hemoglobin.  Platelets show thrombocytopenia although this is known problem for patient.  Hepatic function panel shows mild increase in AST and ALT.  Mild decrease in total protein and albumin.  Charting error with the fecal occult blood.  This was positive. COVID negative.  CT of the chest, abdomen, and pelvis  shows New extensive patchy infiltrates are seen in both lungs suggesting multifocal pneumonia. Minimal bilateral pleural effusions. Coronary artery disease. 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 There is no evidence of intestinal obstruction or pneumoperitoneum. Appendix is not dilated. There is no hydronephrosis. Fatty liver. Small hiatal hernia. Splenomegaly. There is parapelvic cyst in the left kidney. Diverticulosis of colon without signs of focal diverticulitis.  My attending reports that this multifocal pneumonia is likely viral given the patient's COVID in the past 2 weeks and does not recommended any antibiotics.  Given his gallbladder distention as well as tenderness to his right upper quadrant, will order right upper quadrant ultrasound.  Right upper quadrant ultrasound shows Suspected mild nodularity of the hepatic contour with preservation of the hepatic parenchymal echogenicity, nonspecific though could be seen in the setting of early cirrhotic change. Correlation with LFTs is advised.  Given the patient's labs and vital signs, he does not meet any criteria for  sepsis.  He was given a bag of  fluids and Zofran and is feeling much better and comfortable without any repeat emesis.  Given his 3 g drop in his hemoglobin as well as his right upper quadrant pain and positive Hemoccult, consult was placed to GI.  I spoke with Judson Roch, PA who reports that they will see him tomorrow.  Consulted hospitalist who agrees with admission.  I discussed this case with my attending physician who cosigned this note including patient's presenting symptoms, physical exam, and planned diagnostics and interventions. Attending physician stated agreement with plan or made changes to plan which were implemented.   Attending physician assessed patient at bedside.  Final Clinical Impression(s) / ED Diagnoses Final diagnoses:  Gastrointestinal hemorrhage, unspecified gastrointestinal hemorrhage type    Rx / DC Orders ED Discharge Orders     None         Sherrell Puller, PA-C 02/05/22 1946    Wyvonnia Dusky, MD 02/06/22 [id number]

## 2022-02-05 NOTE — ED Triage Notes (Signed)
Pt reports feeling weak and dehydrated for a few days. Also throwing up for a couple days. Seen at [facility] recently for a fall and rib pain.

## 2022-02-06 DIAGNOSIS — I2510 Atherosclerotic heart disease of native coronary artery without angina pectoris: Secondary | ICD-10-CM | POA: Diagnosis present

## 2022-02-06 DIAGNOSIS — J9601 Acute respiratory failure with hypoxia: Secondary | ICD-10-CM | POA: Diagnosis not present

## 2022-02-06 DIAGNOSIS — K922 Gastrointestinal hemorrhage, unspecified: Secondary | ICD-10-CM | POA: Diagnosis present

## 2022-02-06 DIAGNOSIS — K2270 Barrett's esophagus without dysplasia: Secondary | ICD-10-CM | POA: Diagnosis not present

## 2022-02-06 DIAGNOSIS — I480 Paroxysmal atrial fibrillation: Secondary | ICD-10-CM | POA: Diagnosis not present

## 2022-02-06 DIAGNOSIS — J90 Pleural effusion, not elsewhere classified: Secondary | ICD-10-CM | POA: Diagnosis present

## 2022-02-06 DIAGNOSIS — E7800 Pure hypercholesterolemia, unspecified: Secondary | ICD-10-CM | POA: Diagnosis present

## 2022-02-06 DIAGNOSIS — I10 Essential (primary) hypertension: Secondary | ICD-10-CM | POA: Diagnosis present

## 2022-02-06 DIAGNOSIS — D638 Anemia in other chronic diseases classified elsewhere: Secondary | ICD-10-CM | POA: Diagnosis not present

## 2022-02-06 DIAGNOSIS — Z20822 Contact with and (suspected) exposure to covid-19: Secondary | ICD-10-CM | POA: Diagnosis not present

## 2022-02-06 DIAGNOSIS — K5791 Diverticulosis of intestine, part unspecified, without perforation or abscess with bleeding: Secondary | ICD-10-CM | POA: Diagnosis present

## 2022-02-06 DIAGNOSIS — J189 Pneumonia, unspecified organism: Secondary | ICD-10-CM | POA: Diagnosis present

## 2022-02-06 DIAGNOSIS — D61818 Other pancytopenia: Secondary | ICD-10-CM | POA: Diagnosis not present

## 2022-02-06 DIAGNOSIS — G9341 Metabolic encephalopathy: Secondary | ICD-10-CM | POA: Diagnosis present

## 2022-02-06 DIAGNOSIS — Z8616 Personal history of COVID-19: Secondary | ICD-10-CM | POA: Diagnosis not present

## 2022-02-06 DIAGNOSIS — E860 Dehydration: Secondary | ICD-10-CM | POA: Diagnosis present

## 2022-02-06 DIAGNOSIS — K3184 Gastroparesis: Secondary | ICD-10-CM | POA: Diagnosis present

## 2022-02-06 DIAGNOSIS — Z87891 Personal history of nicotine dependence: Secondary | ICD-10-CM | POA: Diagnosis not present

## 2022-02-06 DIAGNOSIS — Z8572 Personal history of non-Hodgkin lymphomas: Secondary | ICD-10-CM | POA: Diagnosis not present

## 2022-02-06 DIAGNOSIS — Z6833 Body mass index (BMI) 33.0-33.9, adult: Secondary | ICD-10-CM | POA: Diagnosis not present

## 2022-02-06 DIAGNOSIS — R112 Nausea with vomiting, unspecified: Secondary | ICD-10-CM | POA: Diagnosis not present

## 2022-02-06 DIAGNOSIS — K219 Gastro-esophageal reflux disease without esophagitis: Secondary | ICD-10-CM | POA: Diagnosis not present

## 2022-02-06 DIAGNOSIS — I7121 Aneurysm of the ascending aorta, without rupture: Secondary | ICD-10-CM | POA: Diagnosis present

## 2022-02-06 DIAGNOSIS — K2961 Other gastritis with bleeding: Secondary | ICD-10-CM | POA: Diagnosis present

## 2022-02-06 DIAGNOSIS — U071 COVID-19: Secondary | ICD-10-CM | POA: Diagnosis not present

## 2022-02-06 DIAGNOSIS — E2740 Unspecified adrenocortical insufficiency: Secondary | ICD-10-CM | POA: Diagnosis not present

## 2022-02-06 DIAGNOSIS — E441 Mild protein-calorie malnutrition: Secondary | ICD-10-CM | POA: Diagnosis present

## 2022-02-06 DIAGNOSIS — K760 Fatty (change of) liver, not elsewhere classified: Secondary | ICD-10-CM | POA: Diagnosis present

## 2022-02-06 DIAGNOSIS — F419 Anxiety disorder, unspecified: Secondary | ICD-10-CM | POA: Diagnosis present

## 2022-02-06 DIAGNOSIS — I4891 Unspecified atrial fibrillation: Secondary | ICD-10-CM | POA: Diagnosis not present

## 2022-02-06 LAB — HEPATIC FUNCTION PANEL
ALT: 48 U/L — ABNORMAL HIGH (ref 0–44)
AST: 44 U/L — ABNORMAL HIGH (ref 15–41)
Albumin: 3.2 g/dL — ABNORMAL LOW (ref 3.5–5.0)
Alkaline Phosphatase: 49 U/L (ref 38–126)
Bilirubin, Direct: 0.3 mg/dL — ABNORMAL HIGH (ref 0.0–0.2)
Indirect Bilirubin: 0.8 mg/dL (ref 0.3–0.9)
Total Bilirubin: 1.1 mg/dL (ref 0.3–1.2)
Total Protein: 6.1 g/dL — ABNORMAL LOW (ref 6.5–8.1)

## 2022-02-06 LAB — BASIC METABOLIC PANEL
Anion gap: 10 (ref 5–15)
BUN: 12 mg/dL (ref 8–23)
CO2: 24 mmol/L (ref 22–32)
Calcium: 8.5 mg/dL — ABNORMAL LOW (ref 8.9–10.3)
Chloride: 106 mmol/L (ref 98–111)
Creatinine, Ser: 1.22 mg/dL (ref 0.61–1.24)
GFR, Estimated: >60 mL/min (ref >60)
Glucose, Bld: 87 mg/dL (ref 70–99)
Potassium: 3.3 mmol/L — ABNORMAL LOW (ref 3.5–5.1)
Sodium: 140 mmol/L (ref 135–145)

## 2022-02-06 LAB — CBC
HCT: 34.4 % — ABNORMAL LOW (ref 39.0–52.0)
Hemoglobin: 11.8 g/dL — ABNORMAL LOW (ref 13.0–17.0)
MCH: 32 pg (ref 26.0–34.0)
MCHC: 34.3 g/dL (ref 30.0–36.0)
MCV: 93.2 fL (ref 80.0–100.0)
Platelets: 124 10*3/uL — ABNORMAL LOW (ref 150–400)
RBC: 3.69 MIL/uL — ABNORMAL LOW (ref 4.22–5.81)
RDW: 12.6 % (ref 11.5–15.5)
WBC: 4.4 10*3/uL (ref 4.0–10.5)
nRBC: 0 % (ref 0.0–0.2)

## 2022-02-06 LAB — IRON AND TIBC
Iron: 19 ug/dL — ABNORMAL LOW (ref 45–182)
Saturation Ratios: 18 % (ref 17.9–39.5)
TIBC: 105 ug/dL — ABNORMAL LOW (ref 250–450)
UIBC: 86 ug/dL

## 2022-02-06 LAB — AMMONIA: Ammonia: 24 umol/L (ref 9–35)

## 2022-02-06 LAB — PROTIME-INR
INR: 1.3 — ABNORMAL HIGH (ref 0.8–1.2)
Prothrombin Time: 15.9 seconds — ABNORMAL HIGH (ref 11.4–15.2)

## 2022-02-06 LAB — FERRITIN: Ferritin: 451 ng/mL — ABNORMAL HIGH (ref 24–336)

## 2022-02-06 LAB — PROCALCITONIN: Procalcitonin: <0.1 ng/mL

## 2022-02-06 LAB — VITAMIN B12: Vitamin B-12: 1168 pg/mL — ABNORMAL HIGH (ref 180–914)

## 2022-02-06 MED ORDER — HYDROCORTISONE 10 MG PO TABS
10.0000 mg | ORAL_TABLET | Freq: Every evening | ORAL | Status: DC
  Administered 2022-02-06 – 2022-02-09 (×3): 10 mg via ORAL
  Filled 2022-02-06 (×9): qty 1

## 2022-02-06 MED ORDER — HYDROCORTISONE 20 MG PO TABS
20.0000 mg | ORAL_TABLET | Freq: Every day | ORAL | Status: DC
  Administered 2022-02-07 – 2022-02-09 (×3): 20 mg via ORAL
  Filled 2022-02-06 (×8): qty 1

## 2022-02-06 MED ORDER — HYDROCORTISONE 10 MG PO TABS: 10.0000 mg | ORAL_TABLET | ORAL | Status: DC

## 2022-02-06 MED ORDER — ALBUTEROL SULFATE (2.5 MG/3ML) 0.083% IN NEBU
2.5000 mg | INHALATION_SOLUTION | Freq: Two times a day (BID) | RESPIRATORY_TRACT | Status: DC
  Administered 2022-02-07 – 2022-02-11 (×8): 2.5 mg via RESPIRATORY_TRACT
  Filled 2022-02-06 (×9): qty 3

## 2022-02-06 MED ORDER — PANTOPRAZOLE SODIUM 40 MG IV SOLR
40.0000 mg | Freq: Two times a day (BID) | INTRAVENOUS | Status: DC
  Administered 2022-02-06 – 2022-02-11 (×9): 40 mg via INTRAVENOUS
  Filled 2022-02-06 (×12): qty 10

## 2022-02-06 MED ORDER — ALBUTEROL SULFATE (2.5 MG/3ML) 0.083% IN NEBU: 2.5000 mg | INHALATION_SOLUTION | Freq: Four times a day (QID) | RESPIRATORY_TRACT | Status: DC | PRN

## 2022-02-06 MED ORDER — POTASSIUM CHLORIDE CRYS ER 20 MEQ PO TBCR
40.0000 meq | EXTENDED_RELEASE_TABLET | Freq: Once | ORAL | Status: AC
  Administered 2022-02-06: 40 meq via ORAL
  Filled 2022-02-06: qty 2

## 2022-02-06 MED ORDER — CYCLOBENZAPRINE HCL 5 MG PO TABS
10.0000 mg | ORAL_TABLET | Freq: Every day | ORAL | Status: DC
  Administered 2022-02-06 – 2022-02-10 (×5): 10 mg via ORAL
  Filled 2022-02-06: qty 1
  Filled 2022-02-06 (×4): qty 2

## 2022-02-06 NOTE — ED Notes (Signed)
The pt keeps c/o not being able to relax constant movement  the wife  at the bedside reports that he needed med for this

## 2022-02-06 NOTE — ED Notes (Signed)
Pt sitting at edge of bed screaming in for help. Pt states "I am sick, I am nauseated and in pain." Patient redirected back in bed and this RN told patient they would be back in with medications.

## 2022-02-06 NOTE — ED Notes (Signed)
The pt s;ept maybe one hour  now hes awake and moaning  he reported no pain earlier.  His sats are in the high 80 and the low 90s  he will not keep nasal 02 in his nose

## 2022-02-06 NOTE — Consult Note (Addendum)
Consultation  Referring Provider:   [facility] Primary Care Physician:  Redmond School, MD Primary Gastroenterologist:  Dr. Briant Cedar       Reason for Consultation:     GI bleeding    Attending physician's note  I have taken a history, reviewed the chart and examined the patient. I performed a substantive portion of this encounter, including complete performance of at least one of the key components, in conjunction with the APP. I agree with the APP's note, impression and recommendations.   71 year old male admitted with acute respiratory failure with hypoxia secondary to multifocal pneumonia, recent COVID infection  He was noted to have pancytopenia, decline in hemoglobin.  He has history of lymphoma proliferative disorder s/p treatment in 2022  Fecal Hemoccult negative.  No history of any overt GI bleeding in the recent past  History of nausea and pill dysphagia, will obtain modified barium swallow to exclude oropharyngeal dysphagia and also to assess for possible aspirational risk No plan for diagnostic endoscopic evaluation at this point   The patient was provided an opportunity to ask questions and all were answered. The patient agreed with the plan and demonstrated an understanding of the instructions.  [name] , MD [phone]      Impression    Nausea, vomiting, poor oral intake leading to malnutrition Long history of nausea vomiting, follows with Dr. Briant Cedar outpatient Last EGD 12/31/2021 for nausea/vomiting showed negative H. pylori gastritis with stigmata of recent bleeding, known short Barrett's esophagus 2019 colonoscopy with 4 diminutive tubular adenomas, tics and hemorrhoids Known Gastroparesis history on Reglan outpatient. On Zofran, [place] outpatient.  Normocytic anemia on Xarelto currently on hold Positive Hemoccult per provider with rectal exam however fecal occult blood sent to the lab is negative. Baseline hemoglobin 15, currently at 12.4, 11.8 on  admission. BUN 05/24 was 19 current admission 13, no elevation of BUN Patient did admit to taking 1 goody powder 1 week ago for HA  Hypokalemia  Potassium 3.3 Check magnesium Replace per primary  Acute respiratory failure with hypoxia secondary multifocal pneumonia, recent COVID infection, possible aspiration On 2 L nasal cannula, no oxygen dependence at home. 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  Elevated LFTs with history of fatty liver Right upper quadrant abdominal ultrasound suspected mild nodularity hepatic contour nonspecific could be early cirrhotic changes. CT shows fatty infiltration, gallbladder distended no wall thickening no ductal dilatation does show splenomegaly.  Diverticulosis without diverticulitis. 06/14/2020 elastography with median K PA 3.1 high probability of being normal. With nodularity, splenomegaly, thrombocytopenia possible this could be early cirrhosis.     Plan   -We will check PT/INR to evaluate for possible coagulopathy with early cirrhotic changes, splenomegaly and thrombocytopenia, check ammonia.  -We will need outpatient follow-up with his primary GI possible repeat elastography work-up -Consider modified barium swallow for symptoms and possible aspiration pneumonia. -Currently with oxygen requirement and multifocal pneumonia, higher risk for endoscopic procedures. -Suggest checking iron, ferritin, B12 with pancytopenia, infection, could be possible bone marrow suppression contributing, normal BUN -Supportive care currently with PPI twice daily, Reglan, Zofran -Will advance diet to clear liquids at this time. -Continue Motegrity milligrams daily, MiraLAX as needed -Can consider endoscopic evaluation especially with history of goody powder use but just once, if respiratory status improves  and/or if he continues to have transfusion dependent anemia or gross GI bleeding.  Thank you for your kind consultation, we will continue to follow.         HPI:   [name] is a 71 y.o. male with past medical history significant for thrombocytopenia/poor p.o. intake, poor oral intake and dehydration.  Follows Dr. Laural Golden, now Dr. Briant Cedar.  EGD/Colon 2019- Colon 4 small polyps TA Patient had recent 12/31/2021 EGD for AB pain, vomiting, neg H pylori gastritis, stigmata of recent bleeding, known short segment Barrett's esophagus. States he continued to have symptoms after this.  Wife [name] is with him gives provides some of the history. Patient was on Reglan 3 times a day in the last years cut down to twice a day. Since August has had worsening nausea vomiting, had EGD that showed gastritis, stigmata of recent bleeding, known Barrett's esophagus. Patient does better on sublingual Zofran, states with pills in general patient will have gagging, nausea 5 minutes after taking medication. Can have dysphagia with food. Does have GERD/reflux despite Dexilant 60 mg daily. Denies odynophagia, coughing with foods. Patient's had decreased oral intake for 3 weeks, down 23 pounds. Patient's had soft stools but every other day has some improvement with Motegrity and fiber. Has seen very small volume bright red blood on toilet paper last 2 weeks, no melena no hematochezia.  Bowel movement day before yesterday. Patient did take 1 Goody powder a week ago for headache. Denies any alcohol use.  Last 3 weeks worsening dehydration and weakness.  Was tested positive for COVID 2 weeks ago, prescribed Paxlovid but his pharmacy did not have it so he could not take it.   Has had nightly fever. 08/28 mechanical fall right-sided rib pain negative imaging in the ER.  Patient's on Dexilant outpatient 60 mg daily, patient takes Reglan 10 mg 3 times daily.  Patient has Zofran 8 mg every 8 hours as needed. Patient is on  Motegrity 2 mg daily, MiraLAX once daily Patient is on hydrocortisone 10 mg tablet, 10 to 20 mg.  On arrival patient afebrile, tachycardic, hypoxic on room air with O2 in the 80s, improved with 2 L [place]. No leukocytosis white blood cell count 3.3, hemoglobin 12.4, platelets 123. AST/ALT slightly elevated, known fatty liver. Pending urine culture. Hemoccult positive with rectal exam, lab send off negative.  COVID PCR negative. CT abdomen chest and pelvis showed new extensive patchy infiltrates in both lungs suggesting multifocal pneumonia minimal bilateral pleural effusions [place] ultrasound showed no acute process, normal gallbladder no gallstones CBD 4 mm size.  40-pack-year smoking history quit 41 years ago.  Denies alcohol use or drug use.   12/31/2021 EGD salmon-colored mucosa consistent with short segment Barrett's esophagus, erosive gastropathy with stigmata of recent bleeding, duodenal diverticulum 08/29/2020 EGD for follow-up Barrett's showed short segment Barrett's esophagus, 2 cm hiatal hernia, loose fundal wrap, normal duodenal bulb  2019 EGD/Colon- Colon 4 small polyps TA, reticular, external hemorrhoids  Abnormal ED labs: Abnormal Labs Reviewed  BASIC METABOLIC PANEL - Abnormal; Notable for the following components:      Result Value   Potassium 3.4 (*)    All other components within normal limits  CBC - Abnormal; Notable for the following components:   WBC 3.3 (*)    RBC 3.94 (*)    Hemoglobin 12.4 (*)    HCT 36.0 (*)    Platelets 123 (*)    All other components within normal limits  URINALYSIS, ROUTINE W REFLEX MICROSCOPIC - Abnormal; Notable for the following components:   Specific Gravity, Urine 1.036 (*)  Ketones, ur 20 (*)    All other components within normal limits  HEPATIC FUNCTION PANEL - Abnormal; Notable for the following components:   Total Protein 6.3 (*)    Albumin 3.2 (*)    AST 53 (*)    ALT 52 (*)    All other components within normal limits   BASIC METABOLIC PANEL - Abnormal; Notable for the following components:   Potassium 3.3 (*)    Calcium 8.5 (*)    All other components within normal limits  CBC - Abnormal; Notable for the following components:   RBC 3.69 (*)    Hemoglobin 11.8 (*)    HCT 34.4 (*)    Platelets 124 (*)    All other components within normal limits  HEPATIC FUNCTION PANEL - Abnormal; Notable for the following components:   Total Protein 6.1 (*)    Albumin 3.2 (*)    AST 44 (*)    ALT 48 (*)    Bilirubin, Direct 0.3 (*)    All other components within normal limits     Past Medical History:  Diagnosis Date   Anxiety    patient denies   Aortic aneurysm ([place])    ascending   Arthritis    Barrett esophagus    Blood transfusion    Cancer ([place]) 2007   melanoma stage 3 stomach bx   Cancer ([place])    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   Coronary artery disease    mild by 09/08/13 cath [facility]   GERD (gastroesophageal reflux disease)    barretts esophagus   H/O hiatal hernia    Headache(784.0)    Hypercholesteremia    Hypertension    PONV (postoperative nausea and vomiting)    trouble breathing after last surgery (at [facility])   Shingles    10   Sleep apnea    not tested   Tumor liver    no problems    Surgical History:  He  has a past surgical history that includes Spinal cord stimulator implant; Melanoma excision; Shoulder arthroscopy; Thyroid lobectomy; Leg Surgery; Knee Arthroplasty; Abdominal surgery; Chest surgery; Lithotripsy; Hemorrhoid surgery; Joint replacement; Hernia repair; Hiatal hernia repair; Posterior cervical fusion/foraminotomy (08/18/2011); back surgeries; Back surgery; Neck surgery (2012); Esophagogastroduodenoscopy (egd) with propofol (N/A, 03/28/2013); biopsy (N/A, 03/28/2013); Esophagogastroduodenoscopy (egd) with propofol (N/A, 05/30/2016); biopsy (05/30/2016); Revision total hip arthroplasty (Left); Knee arthroscopy (Right); Spinal cord stimulator insertion (N/A, 11/03/2016);  Colonoscopy with propofol (N/A, 02/19/2018); Esophagogastroduodenoscopy (egd) with propofol (N/A, 02/19/2018); biopsy (02/19/2018); polypectomy (02/19/2018); Cardiac catheterization (2020); Coronary angioplasty with stent (2020); Esophagogastroduodenoscopy (egd) with propofol (N/A, 08/29/2020); biopsy (08/29/2020); Esophagogastroduodenoscopy (egd) with propofol (N/A, 12/31/2021); and biopsy (12/31/2021). Family History:  His family history includes Aneurysm in his maternal grandmother, mother, sister, and another family member; Heart disease in his brother, father, maternal grandfather, paternal grandfather, and paternal grandmother; Lymphoma in his brother; Skin cancer in an other family member. Social History:   reports that he quit smoking about 41 years ago. His smoking use included cigarettes. He has a 40.00 pack-year smoking history. He has never used smokeless tobacco. He reports that he does not currently use alcohol. He reports that he does not use drugs.  Prior to Admission medications   Medication Sig Start Date End Date Taking? Authorizing Provider  Bioflavonoid Products (ESTER-C) 500-550 MG TABS Take 1 tablet by mouth daily.   Yes [provider]  Calcium Carbonate-Vitamin D 600-10 MG-MCG TABS Take 1 tablet by mouth 2 (two) times daily.  Yes [provider]  cyclobenzaprine (FLEXERIL) 10 MG tablet Take 1 tablet (10 mg total) by mouth 3 (three) times daily as needed for muscle spasms. Patient taking differently: Take 10 mg by mouth daily. 11/05/16  Yes Newman Pies, MD  dexlansoprazole (DEXILANT) 60 MG capsule Take 60 mg by mouth daily.    Yes [provider]  diazepam (VALIUM) 10 MG tablet Take 10 mg by mouth 4 (four) times daily as needed (for back pain).   Yes [provider]  fexofenadine (ALLEGRA) 180 MG tablet Take 180 mg by mouth every evening.   Yes [provider]  fish oil-omega-3 fatty acids 1000 MG capsule Take 1 g by mouth 2 (two) times  daily.   Yes [provider]  hydrocortisone (CORTEF) 10 MG tablet Take 10-20 mg by mouth See admin instructions. Take 10 mg every evening and 20 mg every morning 12/04/20  Yes [provider]  lisinopril (PRINIVIL,ZESTRIL) 10 MG tablet Take 10 mg by mouth daily.   Yes [provider]  Metamucil Fiber CHEW Chew 1 each by mouth daily.   Yes [provider]  metoCLOPramide (REGLAN) 10 MG tablet Take 1 tablet by mouth in the morning and at bedtime. 08/03/20  Yes [provider]  metoprolol succinate (TOPROL-XL) 25 MG 24 hr tablet Take 1 tablet (25 mg total) by mouth daily. 01/27/22  Yes Noemi Chapel, MD  montelukast (SINGULAIR) 10 MG tablet Take 10 mg by mouth daily.    Yes [provider]  Multiple Vitamin (MULITIVITAMIN WITH MINERALS) TABS Take 1 tablet by mouth daily.   Yes [provider]  ondansetron (ZOFRAN) 8 MG tablet Take 1 tablet (8 mg total) by mouth every 8 (eight) hours as needed for vomiting or nausea. 11/18/21  Yes Harvel Quale, MD  oxyCODONE-acetaminophen (PERCOCET) 10-325 MG tablet Take 1 tablet by mouth every 6 (six) hours as needed for pain.   Yes [provider]  Prucalopride Succinate (MOTEGRITY) 2 MG TABS Take 1 tablet (2 mg total) by mouth daily. 12/31/21  Yes Harvel Quale, MD  ranolazine (RANEXA) 500 MG 12 hr tablet Take 500 mg by mouth 2 (two) times daily. 04/23/20  Yes [provider]  rivaroxaban (XARELTO) 20 MG TABS tablet Take 1 tablet (20 mg total) by mouth daily with supper. 05/29/21  Yes Shah, Pratik D, DO  rosuvastatin (CRESTOR) 10 MG tablet Take 5 mg by mouth every evening.   Yes [provider]  sildenafil (VIAGRA) 100 MG tablet Take 50 mg by mouth as needed for erectile dysfunction. 02/10/20  Yes [provider]  PAXLOVID, 300/100, 20 x 150 MG & 10 x '100MG'$  TBPK Take by mouth. 01/24/22   [provider]  Plecanatide (TRULANCE) 3 MG TABS Take 1  tablet by mouth daily. Patient not taking: Reported on 12/20/2021 11/24/21   Montez Morita, Quillian Quince, MD  polyethylene glycol powder [facility]) 17 GM/SCOOP powder Take 17 g by mouth daily. Patient not taking: Reported on 11/18/2021 06/25/21   Rogene Houston, MD    Current Facility-Administered Medications  Medication Dose Route Frequency Provider Last Rate Last Admin   0.9 %  sodium chloride infusion   Intravenous Continuous Dahal, Marlowe Aschoff, MD 75 mL/hr at 02/05/22 2150 New Bag at 02/05/22 2150   acetaminophen (TYLENOL) tablet 650 mg  650 mg Oral Q6H PRN Dahal, Marlowe Aschoff, MD       Or   acetaminophen (TYLENOL) suppository 650 mg  650 mg Rectal Q6H PRN Dahal, Marlowe Aschoff,  MD       albuterol (PROVENTIL) (2.5 MG/3ML) 0.083% nebulizer solution 2.5 mg  2.5 mg Nebulization Q6H Dahal, Binaya, MD   2.5 mg at 02/06/22 0318   Ampicillin-Sulbactam (UNASYN) 3 g in sodium chloride 0.9 % 100 mL IVPB  3 g Intravenous Q6H Dahal, Binaya, MD   Stopped at 02/06/22 0444   bisacodyl (DULCOLAX) EC tablet 5 mg  5 mg Oral Daily PRN Dahal, Marlowe Aschoff, MD       diazepam (VALIUM) tablet 10 mg  10 mg Oral QID PRN Terrilee Croak, MD   10 mg at 02/06/22 0318   hydrALAZINE (APRESOLINE) injection 10 mg  10 mg Intravenous Q6H PRN Dahal, Marlowe Aschoff, MD       methocarbamol (ROBAXIN) 500 mg in dextrose 5 % 50 mL IVPB  500 mg Intravenous Q6H PRN Dahal, Binaya, MD       metoCLOPramide (REGLAN) injection 10 mg  10 mg Intravenous Q8H Dahal, Binaya, MD   10 mg at 02/06/22 4235   metoprolol succinate (TOPROL-XL) 24 hr tablet 25 mg  25 mg Oral Daily Dahal, Binaya, MD       montelukast (SINGULAIR) tablet 10 mg  10 mg Oral Daily Dahal, Binaya, MD       ondansetron (ZOFRAN) injection 4 mg  4 mg Intravenous Q6H PRN Dahal, Marlowe Aschoff, MD   4 mg at 02/05/22 2151   oxyCODONE (Oxy IR/ROXICODONE) immediate release tablet 5 mg  5 mg Oral Q4H PRN Dahal, Marlowe Aschoff, MD   5 mg at 02/06/22 0422   potassium chloride SA (KLOR-CON M) CR tablet 40 mEq  40 mEq Oral Once  Dahal, Marlowe Aschoff, MD       Prucalopride Succinate TABS 2 mg  1 each Oral Daily Dahal, Binaya, MD       psyllium (HYDROCIL/METAMUCIL) 1 packet  1 packet Oral Daily Dahal, Binaya, MD       ranolazine (RANEXA) 12 hr tablet 500 mg  500 mg Oral BID Dahal, Binaya, MD       senna (SENOKOT) tablet 8.6 mg  1 tablet Oral BID Dahal, Binaya, MD       Current Outpatient Medications  Medication Sig Dispense Refill   Bioflavonoid Products (ESTER-C) 500-550 MG TABS Take 1 tablet by mouth daily.     Calcium Carbonate-Vitamin D 600-10 MG-MCG TABS Take 1 tablet by mouth 2 (two) times daily.      cyclobenzaprine (FLEXERIL) 10 MG tablet Take 1 tablet (10 mg total) by mouth 3 (three) times daily as needed for muscle spasms. (Patient taking differently: Take 10 mg by mouth daily.) 50 tablet 1   dexlansoprazole (DEXILANT) 60 MG capsule Take 60 mg by mouth daily.      diazepam (VALIUM) 10 MG tablet Take 10 mg by mouth 4 (four) times daily as needed (for back pain).     fexofenadine (ALLEGRA) 180 MG tablet Take 180 mg by mouth every evening.     fish oil-omega-3 fatty acids 1000 MG capsule Take 1 g by mouth 2 (two) times daily.     hydrocortisone (CORTEF) 10 MG tablet Take 10-20 mg by mouth See admin instructions. Take 10 mg every evening and 20 mg every morning     lisinopril (PRINIVIL,ZESTRIL) 10 MG tablet Take 10 mg by mouth daily.     Metamucil Fiber CHEW Chew 1 each by mouth daily.     metoCLOPramide (REGLAN) 10 MG tablet Take 1 tablet by mouth in the morning and at bedtime.     metoprolol succinate (TOPROL-XL) 25  MG 24 hr tablet Take 1 tablet (25 mg total) by mouth daily. 30 tablet 1   montelukast (SINGULAIR) 10 MG tablet Take 10 mg by mouth daily.      Multiple Vitamin (MULITIVITAMIN WITH MINERALS) TABS Take 1 tablet by mouth daily.     ondansetron (ZOFRAN) 8 MG tablet Take 1 tablet (8 mg total) by mouth every 8 (eight) hours as needed for vomiting or nausea. 30 tablet 2   oxyCODONE-acetaminophen (PERCOCET) 10-325  MG tablet Take 1 tablet by mouth every 6 (six) hours as needed for pain.     Prucalopride Succinate (MOTEGRITY) 2 MG TABS Take 1 tablet (2 mg total) by mouth daily. 90 tablet 3   ranolazine (RANEXA) 500 MG 12 hr tablet Take 500 mg by mouth 2 (two) times daily.     rivaroxaban (XARELTO) 20 MG TABS tablet Take 1 tablet (20 mg total) by mouth daily with supper. 30 tablet 2   rosuvastatin (CRESTOR) 10 MG tablet Take 5 mg by mouth every evening.     sildenafil (VIAGRA) 100 MG tablet Take 50 mg by mouth as needed for erectile dysfunction.     PAXLOVID, 300/100, 20 x 150 MG & 10 x '100MG'$  TBPK Take by mouth.     Plecanatide (TRULANCE) 3 MG TABS Take 1 tablet by mouth daily. (Patient not taking: Reported on 12/20/2021) 90 tablet 3   polyethylene glycol powder (GLYCOLAX/MIRALAX) 17 GM/SCOOP powder Take 17 g by mouth daily. (Patient not taking: Reported on 11/18/2021) 255 g 0    Allergies as of 02/05/2022 - Review Complete 02/05/2022  Allergen Reaction Noted   Demerol Other (See Comments) 08/01/2011    Review of Systems:    Constitutional: No weight loss, fever, chills, weakness or fatigue HEENT: Eyes: No change in vision               Ears, Nose, Throat:  No change in hearing or congestion Skin: No rash or itching Cardiovascular: No chest pain, chest pressure or palpitations   Respiratory: No SOB or cough Gastrointestinal: See HPI and otherwise negative Genitourinary: No dysuria or change in urinary frequency Neurological: No headache, dizziness or syncope Musculoskeletal: No new muscle or joint pain Hematologic: No bleeding or bruising Psychiatric: No history of depression or anxiety     Physical Exam:  Vital signs in last 24 hours: Temp:  [97.8 F (36.6 C)-99.9 F (37.7 C)] 98 F (36.7 C) (09/07 0651) Pulse Rate:  [28-108] 102 (09/07 0730) Resp:  [15-26] 15 (09/07 0715) BP: (113-149)/(63-105) 136/105 (09/07 0730) SpO2:  [77 %-97 %] 92 % (09/07 0730) Weight:  [932 kg] 113 kg (09/06  0954)   Last BM recorded by nurses in past 5 days No data recorded  General: Morbidly obese, chronically ill-appearing male  Head:  Normocephalic and atraumatic. Eyes: sclerae anicteric,conjunctive pink  Heart:  regular rate and rhythm, systolic murmur Pulm: Clear anteriorly, diffusely decreased breath sounds, 2 L nasal cannula, decreased breath sounds at the bases. Abdomen:  Soft, Obese AB, large vertical scar, Sluggish bowel sounds. No tenderness . Without guarding and Without rebound, No organomegaly appreciated. Extremities:  With mild bilateral edema. Msk:  Symmetrical without gross deformities. Peripheral pulses intact.  Neurologic:  Alert and  oriented x4; some mild confusion during exam,.  Slowed reflexes, had positive asterixis, no focal deficits.  Skin:   Dry and intact without significant lesions or rashes.  Ecchymosis bilateral arms Psychiatric:  Cooperative.  Flat affect.  LAB RESULTS: Recent Labs    02/05/22  1023 02/06/22 0408  WBC 3.3* 4.4  HGB 12.4* 11.8*  HCT 36.0* 34.4*  PLT 123* 124*   BMET Recent Labs    02/05/22 1023 02/06/22 0408  NA 141 140  K 3.4* 3.3*  CL 106 106  CO2 22 24  GLUCOSE 94 87  BUN 13 12  CREATININE 1.19 1.22  CALCIUM 9.0 8.5*   LFT Recent Labs    02/06/22 0408  PROT 6.1*  ALBUMIN 3.2*  AST 44*  ALT 48*  ALKPHOS 49  BILITOT 1.1  BILIDIR 0.3*  IBILI 0.8   PT/INR No results for input(s): "LABPROT", "INR" in the last 72 hours.  STUDIES: US Abdomen Limited RUQ (LIVER/GB)  Result Date: 02/05/2022 CLINICAL DATA:  Right upper quadrant abdominal pain. History of cirrhosis. EXAM: ULTRASOUND ABDOMEN LIMITED RIGHT UPPER QUADRANT COMPARISON:  CT the chest, abdomen and pelvis-earlier same day FINDINGS: Gallbladder: Normal sonographic appearance of the gallbladder. No gallbladder wall thickening or pericholecystic stranding. No echogenic gallstones or biliary sludge. Negative sonographic Murphy's sign. Common bile duct: Diameter: Normal  in size measuring 4 mm in diameter Liver: Normal echogenicity of the hepatic parenchyma however there is questioned mild nodularity of the hepatic contour (representative image 26). No discrete hepatic lesions. No intrahepatic biliary dilatation. Portal vein is patent on color Doppler imaging with normal direction of blood flow towards the liver. Other: None. IMPRESSION: Suspected mild nodularity of the hepatic contour with preservation of the hepatic parenchymal echogenicity, nonspecific though could be seen in the setting of early cirrhotic change. Correlation with LFTs is advised. Electronically Signed   By: Sandi Mariscal M.D.   On: 02/05/2022 15:20   CT CHEST ABDOMEN PELVIS W CONTRAST  Result Date: 02/05/2022 CLINICAL DATA:  Abdominal pain, weakness EXAM: CT CHEST, ABDOMEN, AND PELVIS WITH CONTRAST TECHNIQUE: Multidetector CT imaging of the chest, abdomen and pelvis was performed following the standard protocol during bolus administration of intravenous contrast. RADIATION DOSE REDUCTION: This exam was performed according to the departmental dose-optimization program which includes automated exposure control, adjustment of the mA and/or kV according to patient size and/or use of iterative reconstruction technique. CONTRAST:  176m OMNIPAQUE IOHEXOL 300 MG/ML  SOLN COMPARISON:  Previous studies including the CT abdomen and pelvis done on 01/27/2022 FINDINGS: CT CHEST FINDINGS Cardiovascular: There is homogeneous enhancement in thoracic aorta. 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 There are no intraluminal filling defects in central pulmonary artery branches. Peripheral pulmonary artery branches are not adequately visualized for evaluation. Coronary artery calcifications are seen. Mediastinum/Nodes: Unremarkable. Lungs/Pleura: Fairly extensive new patchy ground-glass infiltrates are noted in both lungs. There is minimal bilateral pleural effusions. There is no pneumothorax.  Musculoskeletal: There is surgical fusion in cervical and upper thoracic spine. Pain control lead is seen in thoracic spinal canal. CT ABDOMEN PELVIS FINDINGS Hepatobiliary: There is fatty infiltration. Gallbladder is distended. There is no wall thickening. There is no fluid around the gallbladder. There is no dilation of bile ducts. Pancreas: No focal abnormalities are seen. Spleen: Spleen is enlarged measuring 16 cm in maximum diameter. Adrenals/Urinary Tract: Adrenals are unremarkable. There is no hydronephrosis. There is 4.3 cm parapelvic cyst in the upper pole of left kidney. No follow-up imaging is recommended. There are no renal or ureteral stones. Urinary bladder is unremarkable. Stomach/Bowel: Small hiatal hernia is seen. There are surgical clips in the posterior mediastinum adjacent to the hiatal hernia. Small bowel loops are not dilated. Appendix is not dilated. Scattered diverticula are seen in colon without  signs of focal acute diverticulitis. Vascular/Lymphatic: Arterial calcifications are seen. Reproductive: Unremarkable. Other: There is no ascites or pneumoperitoneum. There is previous ventral hernia repair. Umbilical hernia containing fat is seen. Bilateral inguinal hernias containing fat are seen, larger on the right side. Musculoskeletal: There is previous left hip arthroplasty. Lumbar surgical fusion is seen. Decrease in height of body of T4 vertebra has not changed. No recent fracture is seen. IMPRESSION: New extensive patchy infiltrates are seen in both lungs suggesting multifocal pneumonia. Minimal bilateral pleural effusions. Coronary artery disease. 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 Recommend annual imaging followup by CTA or MRA. This recommendation follows 2010 ACCF/AHA/AATS/ACR/ASA/SCA/SCAI/SIR/STS/SVM Guidelines for the Diagnosis and Management of Patients with Thoracic Aortic Disease. Circulation. 2010; 121: Y503-T465. Aortic aneurysm NOS (ICD10-I71.9)  There is no evidence of intestinal obstruction or pneumoperitoneum. Appendix is not dilated. There is no hydronephrosis. Fatty liver. Small hiatal hernia. Splenomegaly. There is parapelvic cyst in the left kidney. Diverticulosis of colon without signs of focal diverticulitis. Other findings as described in the body of the report. Electronically Signed   By: Elmer Picker M.D.   On: 02/05/2022 13:26     Vladimir Crofts  02/06/2022, 8:13 AM

## 2022-02-06 NOTE — Progress Notes (Signed)
  Transition of Care [facility]) Screening Note   Patient Details  Name: ZUBIN PONTILLO Date of Birth: [birth year]   Transition of Care [facility]) CM/SW Contact:    Cyndi Bender, RN Phone Number: 02/06/2022, 4:17 PM    Transition of Care Department [facility]) has reviewed patient and no TOC needs have been identified at this time. We will continue to monitor patient advancement through interdisciplinary progression rounds. If new patient transition needs arise, please place a TOC consult.

## 2022-02-06 NOTE — Progress Notes (Signed)
PROGRESS NOTE  [name]  DOB: [birth year]  PCP: Redmond School, MD UDJ:[id number]  DOA: 02/05/2022  LOS: 0 days  Hospital Day: 2  Brief narrative:  [name] is a 71 y.o. 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 Patient has had Nissen's fundoplication complicated by Barrett's esophagus, gastroparesis.  He has chronic intermittent nausea and vomiting for which he is on Reglan twice a day. For the last few months, he has more pronounced nausea and vomiting.  He would gag after eating or drinking anything.  He has been slowly getting dehydrated.  Lost about 23 pounds in 3 weeks.  He was recently seen by GI Dr. Jenetta Cochran and underwent EGD on 8/1.  He was found to have known short segment Barrett's esophagus, erosive gastropathy with stigmata of recent bleeding.  He continued to have symptoms after that as well.  Over the period of last 3 weeks, patient has become significantly dehydrated and weak.   About 2 weeks ago, patient tested positive for COVID.  He had mild symptoms only.  He was prescribed Paxlovid but his pharmacy did not have it so he could not take it.   Over the last several days, patient has had nightly fever as well.   8/28, he had a fall sustaining right-sided rib pain.  He was seen in the ED.  Imaging at that time was negative and hence patient was discharged home.  9/6, patient returned to the ED with complaint of worsening symptoms.  In the ED, patient was afebrile, heart rate 108, blood pressure 144/90.  Hemoccult positive with hemoglobin at 12.4 less than his baseline of 15. CT chest, abdomen pelvis showed new extensive patchy infiltrates are seen in both lungs suggesting  multifocal pneumonia, minimal bilateral pleural effusions. Ultrasound abdomen showed early acute changes, normal gallbladder, no gallstones.  CBD 4 mm size. Admitted to [facility]. GI consult obtained.  Subjective: Patient was seen and examined this afternoon.  Lying on bed.  Daughter was at bedside.  Patient has not tried any food this morning.  He is afraid that he will be gagging again. GI consult appreciated.  Assessment/Plan: Principal Problem:   GI bleeding  Intractable nausea, vomiting History of Nissen fundoplication complicated by Barrett's esophagus Patient seems to be having a flareup of his chronic intermittent nausea vomiting.  He is significantly weak and dehydrated. He is on IV fluid, IV Reglan, IV Protonix. GI consult obtained. Defer to GI for further studies. Patient to try clear liquid diet this afternoon.  Normocytic anemia on Xarelto Suspect upper GI bleeding Baseline hemoglobin over 15.  Presented with hemoglobin 12.4.  Positive Hemoccult test in ED. Xarelto on hold.  Patient on PPI. Continue to monitor Recent Labs    05/05/21 1024 05/05/21 2115 05/06/21 0406 06/19/21 0850 10/23/21 0952 01/27/22 1056 02/05/22 1023 02/06/22 0408  HGB 15.2 14.0 13.9 15.5 15.6 15.7 12.4* 11.8*   Acute respiratory failure Multifocal pneumonia -suspect aspiration Recent COVID infection I suspect patient has chronic aspiration due to chronic abdominal nausea vomiting.  Wife reported nightly fever last few days. CT chest read as multifocal pneumonia Suspected aspiration pneumonia.  The radiology finding may be also partially secondary to recent COVID infection.   Procalcitonin level is not elevated however. Currently on short course  of IV Unasyn. Required supplemental oxygen on admission.  Currently not requiring at rest.  Check on ambulation. Recent Labs  Lab 02/05/22 1023 02/06/22 0408  WBC 3.3* 4.4  PROCALCITON  --  <0.10   Essential hypertension PTA on metoprolol,  lisinopril 10 mg daily Continue metoprolol.  Continue to hold lisinopril.  mild CAD, ascending aortic aneurysm HLD PTA on metoprolol, Ranexa, Xarelto, Crestor Resume metoprolol and Ranexa.  Keep child Entresto on hold  History of lymphoma, melanoma With oncologist at [facility]  Last chemotherapy was in 2022.  Recently followed-up.  Currently remission. PTA, patient seems to be on hydrocortisone 20 g in a.m. and 10 mg in p.m. we will resume the same.  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 Currently on hold because of GI bleeding.  fatty liver Ultrasound abdomen showed early acute changes, normal gallbladder, no gallstones.  CBD 4 mm size. Mildly elevated LFTs.  Continue to trend. Recent Labs  Lab 02/05/22 1023 02/06/22 0408  AST 53* 44*  ALT 52* 48*  ALKPHOS 46 49  BILITOT 1.1 1.1  PROT 6.3* 6.1*  ALBUMIN 3.2* 3.2*   IBS-C PTA on Motegrity, MiraLAX, Metamucil  Anxiety Valium as needed  Mobility -Encourage ambulation  Goals of care - -  Code Status: Full Code    Diet:  Diet Order             Diet clear liquid Room service appropriate? Yes; Fluid consistency: Thin  Diet effective now                  DVT prophylaxis:  SCDs Start: 02/05/22 1729   Antimicrobials: Unasyn Fluid: NS at 35 mill per hour Consultants: GI Family Communication: Wife at bedside  Status is: Observation  Continue in-hospital care because: Still unable to eat, requires IV Protonix, IV Zofran.  Further GI studies pending Level of care: Telemetry Medical   Dispo: The patient is from: Home              Anticipated d/c is to: Pending clinical course              Patient currently is not medically stable to d/c.   Difficult to place patient No     Infusions:   sodium chloride 75 mL/hr at 02/06/22 0927   ampicillin-sulbactam (UNASYN) IV Stopped (02/06/22 0957)   methocarbamol (ROBAXIN) IV      Scheduled Meds:   albuterol  2.5 mg Nebulization Q6H   cyclobenzaprine  10 mg Oral Daily   hydrocortisone  10 mg Oral QPM   [START ON 02/07/2022] hydrocortisone  20 mg Oral Daily   metoCLOPramide (REGLAN) injection  10 mg Intravenous Q8H   metoprolol succinate  25 mg Oral Daily   montelukast  10 mg Oral Daily   pantoprazole (PROTONIX) IV  40 mg Intravenous Q12H   Prucalopride Succinate  1 each Oral Daily   psyllium  1 packet Oral Daily   ranolazine  500 mg Oral BID   senna  1 tablet Oral BID    PRN meds: acetaminophen **OR** acetaminophen, bisacodyl, diazepam, hydrALAZINE, methocarbamol (ROBAXIN) IV, ondansetron (ZOFRAN) IV, oxyCODONE   Antimicrobials: Anti-infectives (From admission, onward)    Start     Dose/Rate Route Frequency Ordered Stop   02/06/22 0400  Ampicillin-Sulbactam (UNASYN) 3 g in sodium chloride 0.9 % 100 mL IVPB        3 g 200 mL/hr over 30 Minutes  Intravenous Every 6 hours 02/05/22 2214     02/05/22 1830  Ampicillin-Sulbactam (UNASYN) 3 g in sodium chloride 0.9 % 100 mL IVPB  Status:  Discontinued        3 g 200 mL/hr over 30 Minutes Intravenous Every 6 hours 02/05/22 1822 02/05/22 2214       Objective: Vitals:   02/06/22 1315 02/06/22 1355  BP: (!) 88/71 130/72  Pulse: 88 78  Resp:    Temp:    SpO2: 95% 92%   No intake or output data in the 24 hours ending 02/06/22 1422 Filed Weights   02/05/22 0954  Weight: 113 kg   Weight change:  Body mass index is 33.79 kg/m.   Physical Exam: General exam: Pleasant, elderly Caucasian male. Skin: No rashes, lesions or ulcers. HEENT: Atraumatic, normocephalic, no obvious bleeding Lungs: Diminished air entry in both bases.  No crackles or wheezing CVS: Regular rate and rhythm, no murmur GI/Abd soft, distended from obesity, nontender, bowel sound present CNS: Alert, awake, oriented x3 Psychiatry: Sad affect Extremities: No pedal edema, no calf tenderness  Data Review: I have personally reviewed the laboratory data and  studies available.  F/u labs ordered Unresulted Labs (From admission, onward)     Start     Ordered   02/06/22 0500  Procalcitonin  Daily at 5am,   R      02/05/22 1709   02/05/22 1710  Procalcitonin - Baseline  Add-on,   AD        02/05/22 1709            Signed, Terrilee Croak, MD [facility] 02/06/2022

## 2022-02-07 DIAGNOSIS — K922 Gastrointestinal hemorrhage, unspecified: Secondary | ICD-10-CM | POA: Diagnosis not present

## 2022-02-07 DIAGNOSIS — U071 COVID-19: Secondary | ICD-10-CM | POA: Diagnosis not present

## 2022-02-07 DIAGNOSIS — R112 Nausea with vomiting, unspecified: Secondary | ICD-10-CM | POA: Diagnosis not present

## 2022-02-07 DIAGNOSIS — K3184 Gastroparesis: Secondary | ICD-10-CM | POA: Diagnosis not present

## 2022-02-07 LAB — HEMOGLOBIN AND HEMATOCRIT, BLOOD
HCT: 33.4 % — ABNORMAL LOW (ref 39.0–52.0)
Hemoglobin: 11.4 g/dL — ABNORMAL LOW (ref 13.0–17.0)

## 2022-02-07 LAB — PROCALCITONIN: Procalcitonin: <0.1 ng/mL

## 2022-02-07 MED ORDER — ONDANSETRON HCL 4 MG/2ML IJ SOLN
4.0000 mg | Freq: Four times a day (QID) | INTRAMUSCULAR | Status: DC
  Administered 2022-02-07 – 2022-02-14 (×14): 4 mg via INTRAVENOUS
  Filled 2022-02-07 (×15): qty 2

## 2022-02-07 MED ORDER — POLYETHYLENE GLYCOL 3350 17 G PO PACK
17.0000 g | PACK | Freq: Two times a day (BID) | ORAL | Status: DC
  Administered 2022-02-08 – 2022-02-12 (×8): 17 g via ORAL
  Filled 2022-02-07 (×12): qty 1

## 2022-02-07 NOTE — Progress Notes (Signed)
PROGRESS NOTE  [name]  DOB: [birth year]  PCP: Redmond School, MD YDX:[id number]  DOA: 02/05/2022  LOS: 1 day  Hospital Day: 3  Brief narrative:  [name] is a 71 y.o. 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 Patient has had Nissen's fundoplication complicated by Barrett's esophagus, gastroparesis.  He has chronic intermittent nausea and vomiting for which he is on Reglan twice a day. For the last few months, he has more pronounced nausea and vomiting.  He would gag after eating or drinking anything.  He has been slowly getting dehydrated.  Lost about 23 pounds in 3 weeks.  He was recently seen by GI Dr. Jenetta Cochran and underwent EGD on 8/1.  He was found to have known short segment Barrett's esophagus, erosive gastropathy with stigmata of recent bleeding.  He continued to have symptoms after that as well.  Over the period of last 3 weeks, patient has become significantly dehydrated and weak.   About 2 weeks ago, patient tested positive for COVID.  He had mild symptoms only.  He was prescribed Paxlovid but his pharmacy did not have it so he could not take it.   Over the last several days, patient has had nightly fever as well.   8/28, he had a fall sustaining right-sided rib pain.  He was seen in the ED.  Imaging at that time was negative and hence patient was discharged home.  9/6, patient returned to the ED with complaint of worsening symptoms.  In the ED, patient was afebrile, heart rate 108, blood pressure 144/90.  Hemoccult positive with hemoglobin at 12.4 less than his baseline of 15. CT chest, abdomen pelvis showed new extensive patchy infiltrates are seen in both lungs suggesting  multifocal pneumonia, minimal bilateral pleural effusions. Ultrasound abdomen showed early acute changes, normal gallbladder, no gallstones.  CBD 4 mm size. Admitted to [facility]. GI consult obtained.  Subjective: Patient was seen and examined this morning.  Lying on bed.  Not in distress. Noted a plan from GI for barium swallow. On clear liquid diet at this time  Assessment/Plan: Principal Problem:   GI bleeding  Intractable nausea, vomiting History of Nissen fundoplication complicated by Barrett's esophagus Patient seems to be having a flareup of his chronic intermittent nausea vomiting.  He is significantly weak and dehydrated. He is on IV fluid, IV Reglan, IV Protonix. GI consult obtained.  Noted a plan of barium swallow. Clear liquid diet at this time.  Normocytic anemia on Xarelto Suspect upper GI bleeding Baseline hemoglobin over 15.  Presented with hemoglobin 12.4.   Xarelto on hold.  Patient on PPI. Continue to monitor Recent Labs    05/05/21 1024 05/05/21 2115 05/06/21 0406 06/19/21 0850 10/23/21 0952 01/27/22 1056 02/05/22 1023 02/06/22 0408  HGB 15.2 14.0 13.9 15.5 15.6 15.7 12.4* 11.8*    Acute respiratory failure Multifocal pneumonia -suspect aspiration Recent COVID infection I suspect patient has chronic aspiration due to chronic abdominal nausea vomiting.  Wife reported nightly fever last few days. CT chest read as multifocal pneumonia Suspected aspiration pneumonia.  The radiology finding may be also partially secondary to recent COVID infection.   Procalcitonin level is not elevated however. Currently on short course of IV Unasyn. Required supplemental oxygen on admission.  Currently not requiring  at rest.  Check on ambulation. Recent Labs  Lab 02/05/22 1023 02/06/22 0408 02/07/22 0257  WBC 3.3* 4.4  --   PROCALCITON  --  <0.10 <0.10    Essential hypertension PTA on metoprolol, lisinopril 10 mg daily Continue metoprolol.  Continue to hold  lisinopril.  mild CAD, ascending aortic aneurysm HLD PTA on metoprolol, Ranexa, Xarelto, Crestor Resume metoprolol and Ranexa.  Keep child Entresto on hold  History of lymphoma, melanoma With oncologist at [facility].  Last chemotherapy was in 2022.  Recently followed-up.  Currently remission. PTA, patient seems to be on hydrocortisone 20 g in a.m. and 10 mg in p.m. Same has been resumed.  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 Currently on hold because of GI bleeding.  Fatty liver Ultrasound abdomen showed early acute changes, normal gallbladder, no gallstones.  CBD 4 mm size. Mildly elevated LFTs.  Continue to trend. Recent Labs  Lab 02/05/22 1023 02/06/22 0408  AST 53* 44*  ALT 52* 48*  ALKPHOS 46 49  BILITOT 1.1 1.1  PROT 6.3* 6.1*  ALBUMIN 3.2* 3.2*    IBS-C PTA on Motegrity, MiraLAX, Metamucil  Anxiety Valium as needed  Mobility Encourage ambulation  Goals of care - -  Code Status: Full Code    Diet:  Diet Order             Diet clear liquid Room service appropriate? Yes; Fluid consistency: Thin  Diet effective now                  DVT prophylaxis:  SCDs Start: 02/05/22 1729   Antimicrobials: Unasyn Fluid: NS at 47 mill per hour Consultants: GI Family Communication: Wife at bedside  Status is: Observation  Continue in-hospital care because: Still unable to eat, requires IV Protonix, IV Zofran.  Further GI studies pending Level of care: Telemetry Medical   Dispo: The patient is from: Home              Anticipated d/c is to: Pending clinical course              Patient currently is not medically stable to d/c.   Difficult to place patient No     Infusions:   sodium chloride 75 mL/hr at 02/06/22 2224   ampicillin-sulbactam (UNASYN) IV 3 g (02/07/22 0847)   methocarbamol (ROBAXIN) IV      Scheduled Meds:  albuterol  2.5 mg Nebulization BID   cyclobenzaprine  10 mg Oral  Daily   hydrocortisone  10 mg Oral QPM   hydrocortisone  20 mg Oral Daily   metoCLOPramide (REGLAN) injection  10 mg Intravenous Q8H   metoprolol succinate  25 mg Oral Daily   montelukast  10 mg Oral Daily   pantoprazole (PROTONIX) IV  40 mg Intravenous Q12H   psyllium  1 packet Oral Daily   ranolazine  500 mg Oral BID   senna  1 tablet Oral BID    PRN meds: acetaminophen **OR** acetaminophen, albuterol, bisacodyl, diazepam, hydrALAZINE, methocarbamol (ROBAXIN) IV, ondansetron (ZOFRAN) IV, oxyCODONE   Antimicrobials: Anti-infectives (From admission, onward)    Start     Dose/Rate Route Frequency Ordered Stop   02/06/22 0400  Ampicillin-Sulbactam (UNASYN) 3 g in sodium chloride 0.9 % 100 mL IVPB        3 g 200 mL/hr over 30 Minutes Intravenous Every 6 hours 02/05/22 2214 02/11/22 0359   02/05/22 1830  Ampicillin-Sulbactam (UNASYN)  3 g in sodium chloride 0.9 % 100 mL IVPB  Status:  Discontinued        3 g 200 mL/hr over 30 Minutes Intravenous Every 6 hours 02/05/22 1822 02/05/22 2214       Objective: Vitals:   02/07/22 0842 02/07/22 1148  BP: (!) 154/88 93/79  Pulse: 97 80  Resp: 17 14  Temp:  99.3 F (37.4 C)  SpO2: 91% 90%   No intake or output data in the 24 hours ending 02/07/22 1324 Filed Weights   02/05/22 0954 02/06/22 1600  Weight: 113 kg 112 kg   Weight change: -1 kg Body mass index is 33.49 kg/m.   Physical Exam: General exam: Pleasant, elderly Caucasian male.  Looks sad.  Not in pain at rest Skin: No rashes, lesions or ulcers. HEENT: Atraumatic, normocephalic, no obvious bleeding Lungs: Diminished air entry in both bases.  No crackles or wheezing CVS: Regular rate and rhythm, no murmur GI/Abd soft, distended from obesity, nontender, bowel sound present CNS: Alert, awake, oriented x3 Psychiatry: Sad affect Extremities: No pedal edema, no calf tenderness  Data Review: I have personally reviewed the laboratory data and studies available.  F/u labs  ordered Unresulted Labs (From admission, onward)     Start     Ordered   02/08/22 0500  CBC with Differential/Platelet  Tomorrow morning,   R        02/07/22 0730   02/08/22 1610  Basic metabolic panel  Tomorrow morning,   R        02/07/22 0730   02/08/22 0500  Magnesium  Tomorrow morning,   R        02/07/22 0730   02/08/22 0500  Phosphorus  Tomorrow morning,   R        02/07/22 0730            Signed, Terrilee Croak, MD [facility] 02/07/2022

## 2022-02-07 NOTE — Progress Notes (Addendum)
Progress Note  Primary GI: Dr. Briant Cedar   Subjective  Chief Complaint: GI bleeding  Patient lying in bed, wife at bedside. Patient still on nasal cannula 2 L.  Still complaining of weakness and shortness of breath. Continues to have nausea but reports no further vomiting. No abdominal pain. Patient is has not had a bowel movement, not uncommon for him due to history of constipation.    Objective   Vital signs in last 24 hours: Temp:  [98.5 F (36.9 C)-99.6 F (37.6 C)] 99.3 F (37.4 C) (09/08 1148) Pulse Rate:  [77-101] 80 (09/08 1148) Resp:  [14-20] 14 (09/08 1148) BP: (93-158)/(65-88) 93/79 (09/08 1148) SpO2:  [90 %-94 %] 90 % (09/08 1148) Weight:  [778 kg] 112 kg (09/07 1600) Last BM Date : 02/06/22 Last BM recorded by nurses in past 5 days No data recorded  General: Morbidly obese, chronically ill-appearing male  Heart:  regular rate and rhythm, systolic murmur Pulm: Clear anteriorly, 2 L nasal cannula, decreased breath sounds at the bases. Abdomen:  Soft, Obese AB, large vertical scar, Sluggish to hypoactive bowel sounds. No tenderness . Without guarding and Without rebound, No organomegaly appreciated. Extremities:  With mild bilateral edema. Msk:  Symmetrical without gross deformities. Peripheral pulses intact.  Neurologic:  Alert and  oriented x4;  Skin:   Dry and intact without significant lesions or rashes.  Ecchymosis bilateral arms Psychiatric:  Cooperative.  Flat affect.  Intake/Output from previous day: No intake/output data recorded. Intake/Output this shift: No intake/output data recorded.  Studies/Results: No results found.  Lab Results: Recent Labs    02/05/22 1023 02/06/22 0408  WBC 3.3* 4.4  HGB 12.4* 11.8*  HCT 36.0* 34.4*  PLT 123* 124*   BMET Recent Labs    02/05/22 1023 02/06/22 0408  NA 141 140  K 3.4* 3.3*  CL 106 106  CO2 22 24  GLUCOSE 94 87  BUN 13 12  CREATININE 1.19 1.22  CALCIUM 9.0 8.5*   LFT Recent Labs     02/06/22 0408  PROT 6.1*  ALBUMIN 3.2*  AST 44*  ALT 48*  ALKPHOS 72  BILITOT 1.1  BILIDIR 0.3*  IBILI 0.8   PT/INR Recent Labs    02/06/22 0958  LABPROT 15.9*  INR 1.3*       Impression/Plan:   Nausea, vomiting, poor oral intake leading to malnutrition Long history of nausea vomiting, follows with Dr. Briant Cedar outpatient Last EGD 12/31/2021 for nausea/vomiting showed negative H. pylori gastritis with stigmata of recent bleeding, known short Barrett's esophagus 2019 colonoscopy with 4 diminutive tubular adenomas, tics and hemorrhoids Known history of gastroparesis Current infection with multifocal pneumonia, recent COVID  With recent EGD and current pneumonia requiring oxygen and weakness would suggest continuing conservative management. Continues to have nausea but states he has not vomited anymore. Continue Protonix 40 mg twice daily IV, Reglan 10 mg every 8 hours, getting Zofran as needed every 4 hours will change as scheduled. Pending barium swallow   Normocytic anemia on Xarelto currently on hold HGB 11.8 Platelets 124 Positive Hemoccult per provider with rectal exam however fecal occult blood sent to the lab is negative, like positive rectal FOBT due to trauma or false positive Baseline hemoglobin 15, currently 11.8, no elevation of BUN. Patient did admit to taking 1 goody powder 1 week ago for HA No labs for today, will get hemoglobin hematocrit today and CBC tomorrow.  Adrenal insufficiency Current infections, weakness, nausea and vomiting, hypokalemia On Cortef 10 mg  in the evening 20 in the morning, will suggest early morning cortisol to see if this is sufficient enough during infection  Chronic idiopathic Constipation Long history, was on [place] outpatient, currently not on formulary. Has just been on Senokot 2 times daily We will add on MiraLAX twice daily Dulcolax suppository as needed  Hypokalemia  Potassium 3.3  Magnesium 2.1  Check  magnesium Replace per primary, no labs today, rechecking tomorrow   Acute respiratory failure with hypoxia secondary multifocal pneumonia, recent COVID infection, possible aspiration On 2 L nasal cannula, no oxygen dependence at home. 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   Elevated LFTs with history of fatty liver Right upper quadrant abdominal ultrasound suspected mild nodularity hepatic contour nonspecific could be early cirrhotic changes. CT shows fatty infiltration, gallbladder distended no wall thickening no ductal dilatation does show splenomegaly.  Diverticulosis without diverticulitis. 06/14/2020 elastography with median K PA 3.1 high probability of being normal. With nodularity, splenomegaly, thrombocytopenia possible this could be early cirrhosis. INR 1.3, ammonia low. We will need outpatient follow-up.    LOS: 1 day   Vladimir Crofts  02/07/2022, 2:54 PM    Attending physician's note   I have taken a history, reviewed the chart and examined the patient. I performed a substantive portion of this encounter, including complete performance of at least one of the key components, in conjunction with the APP. I agree with the APP's note, impression and recommendations.    Continues to have poor p.o. intake He has nausea but no vomiting. He remains on oxygen and has extreme fatigue s/p recent COVID infection and pneumonia  Decreased appetite, nausea and vomiting likely secondary to known gastroparesis Will obtain modified barium swallow and barium esophagram to exclude any significant aspiration risk or esophageal stricture or erosive esophagitis  Okay to resume Xarelto, hemoglobin remained stable No plan for endoscopic evaluation at this point  GI will continue to follow along     [name] , MD [phone]

## 2022-02-08 ENCOUNTER — Inpatient Hospital Stay (HOSPITAL_COMMUNITY): Payer: Medicare Other

## 2022-02-08 DIAGNOSIS — I4891 Unspecified atrial fibrillation: Secondary | ICD-10-CM | POA: Diagnosis not present

## 2022-02-08 DIAGNOSIS — J189 Pneumonia, unspecified organism: Secondary | ICD-10-CM

## 2022-02-08 DIAGNOSIS — K2270 Barrett's esophagus without dysplasia: Secondary | ICD-10-CM | POA: Diagnosis not present

## 2022-02-08 DIAGNOSIS — K922 Gastrointestinal hemorrhage, unspecified: Secondary | ICD-10-CM | POA: Diagnosis not present

## 2022-02-08 DIAGNOSIS — U071 COVID-19: Secondary | ICD-10-CM | POA: Diagnosis not present

## 2022-02-08 DIAGNOSIS — K3184 Gastroparesis: Secondary | ICD-10-CM | POA: Diagnosis not present

## 2022-02-08 DIAGNOSIS — K219 Gastro-esophageal reflux disease without esophagitis: Secondary | ICD-10-CM | POA: Diagnosis not present

## 2022-02-08 LAB — CBC WITH DIFFERENTIAL/PLATELET
Abs Immature Granulocytes: 0.11 10*3/uL — ABNORMAL HIGH (ref 0.00–0.07)
Basophils Absolute: 0 10*3/uL (ref 0.0–0.1)
Basophils Relative: 0 %
Eosinophils Absolute: 0 10*3/uL (ref 0.0–0.5)
Eosinophils Relative: 0 %
HCT: 28.8 % — ABNORMAL LOW (ref 39.0–52.0)
Hemoglobin: 9.9 g/dL — ABNORMAL LOW (ref 13.0–17.0)
Immature Granulocytes: 2 %
Lymphocytes Relative: 7 %
Lymphs Abs: 0.4 10*3/uL — ABNORMAL LOW (ref 0.7–4.0)
MCH: 31.9 pg (ref 26.0–34.0)
MCHC: 34.4 g/dL (ref 30.0–36.0)
MCV: 92.9 fL (ref 80.0–100.0)
Monocytes Absolute: 0.6 10*3/uL (ref 0.1–1.0)
Monocytes Relative: 11 %
Neutro Abs: 4.4 10*3/uL (ref 1.7–7.7)
Neutrophils Relative %: 80 %
Platelets: 103 10*3/uL — ABNORMAL LOW (ref 150–400)
RBC: 3.1 MIL/uL — ABNORMAL LOW (ref 4.22–5.81)
RDW: 12.8 % (ref 11.5–15.5)
WBC: 5.5 10*3/uL (ref 4.0–10.5)
nRBC: 0 % (ref 0.0–0.2)

## 2022-02-08 LAB — ECHOCARDIOGRAM COMPLETE
Area-P 1/2: 3.42 cm2
Height: 72 in
S' Lateral: 4 cm
Weight: 3950.64 oz

## 2022-02-08 LAB — BASIC METABOLIC PANEL
Anion gap: 8 (ref 5–15)
BUN: 10 mg/dL (ref 8–23)
CO2: 22 mmol/L (ref 22–32)
Calcium: 7 mg/dL — ABNORMAL LOW (ref 8.9–10.3)
Chloride: 110 mmol/L (ref 98–111)
Creatinine, Ser: 0.95 mg/dL (ref 0.61–1.24)
GFR, Estimated: >60 mL/min (ref >60)
Glucose, Bld: 86 mg/dL (ref 70–99)
Potassium: 3.3 mmol/L — ABNORMAL LOW (ref 3.5–5.1)
Sodium: 140 mmol/L (ref 135–145)

## 2022-02-08 LAB — TROPONIN I (HIGH SENSITIVITY)
Troponin I (High Sensitivity): 13 ng/L (ref <18)
Troponin I (High Sensitivity): 34 ng/L — ABNORMAL HIGH (ref <18)

## 2022-02-08 LAB — MAGNESIUM: Magnesium: 1.2 mg/dL — ABNORMAL LOW (ref 1.7–2.4)

## 2022-02-08 LAB — PHOSPHORUS: Phosphorus: 1.4 mg/dL — ABNORMAL LOW (ref 2.5–4.6)

## 2022-02-08 LAB — D-DIMER, QUANTITATIVE: D-Dimer, Quant: 4.02 ug/mL-FEU — ABNORMAL HIGH (ref 0.00–0.50)

## 2022-02-08 LAB — CORTISOL-AM, BLOOD: Cortisol - AM: 24 ug/dL — ABNORMAL HIGH (ref 6.7–22.6)

## 2022-02-08 MED ORDER — METOPROLOL TARTRATE 5 MG/5ML IV SOLN
2.5000 mg | Freq: Once | INTRAVENOUS | Status: AC
Start: 2022-02-08 — End: 2022-02-08
  Administered 2022-02-08: 2.5 mg via INTRAVENOUS
  Filled 2022-02-08: qty 5

## 2022-02-08 MED ORDER — PROCHLORPERAZINE EDISYLATE 10 MG/2ML IJ SOLN
5.0000 mg | Freq: Four times a day (QID) | INTRAMUSCULAR | Status: DC | PRN
Start: 2022-02-08 — End: 2022-02-14
  Administered 2022-02-08: 5 mg via INTRAVENOUS
  Filled 2022-02-08: qty 2

## 2022-02-08 MED ORDER — POTASSIUM CHLORIDE 20 MEQ PO PACK
40.0000 meq | PACK | ORAL | Status: AC
  Administered 2022-02-08 (×2): 40 meq via ORAL
  Filled 2022-02-08 (×2): qty 2

## 2022-02-08 MED ORDER — MAGNESIUM SULFATE 4 GM/100ML IV SOLN
4.0000 g | Freq: Once | INTRAVENOUS | Status: AC
  Administered 2022-02-08: 4 g via INTRAVENOUS
  Filled 2022-02-08: qty 100

## 2022-02-08 MED ORDER — SODIUM CHLORIDE 0.9 % IV BOLUS
500.0000 mL | Freq: Once | INTRAVENOUS | Status: AC
  Administered 2022-02-08: 500 mL via INTRAVENOUS

## 2022-02-08 MED ORDER — LINACLOTIDE 145 MCG PO CAPS
290.0000 ug | ORAL_CAPSULE | Freq: Every day | ORAL | Status: DC
  Administered 2022-02-09 – 2022-02-14 (×6): 290 ug via ORAL
  Filled 2022-02-08 (×6): qty 2

## 2022-02-08 MED ORDER — RIVAROXABAN 20 MG PO TABS: 20.0000 mg | ORAL_TABLET | Freq: Every day | ORAL | Status: DC

## 2022-02-08 NOTE — Progress Notes (Signed)
Patient noted to be febrile with a temp of 100.9 and tachycardia. Upon review, it appeared that patient was going in and out of Afib. 12-lead obtained and confirmed that patient was in Afib. Tylenol also given for patient's temp. MD notified. See patient's chart for additional orders.   This RN called patient's daughter per request and updated her on patient's condition.

## 2022-02-08 NOTE — Progress Notes (Addendum)
HOSPITAL MEDICINE OVERNIGHT EVENT NOTE    Notified by nursing via secure chat that patient has been complaining of right-sided chest discomfort.  Patient has been stating that this chest discomfort is "crushing" and severe.  Both time we are able to discuss the patient's complaints further upon repeat nursing assessment the patient is now denying any chest discomfort once however.  That being said patient is also notably somewhat confused after receiving his last dose of Valium approximately 1 hour ago.  Considering patient's known history of coronary artery disease and stent placement will obtain a stat EKG and obtain serial troponins.  Continue to monitor patient on telemetry.  Vernelle Emerald  MD [facility]    ADDENDUM [phone] 4:45AM)  Notified by nursing that patient suddenly began to complain of "feeling bad."  Nursing checked patient's vitals and found him to be febrile at 100.9 F.  Telemetry monitoring exhibited rapid atrial fibrillation with heart rates in excess of 120 beats a minute confirmed via twelve-lead EKG.  Per my review of the patient's older medical records, it seems that patient had other episodes of atrial fibrillation in the past with an episode of postoperative atrial fibrillation in 2012 and again another short bout of atrial fibrillation upon being evaluated in the emergency department at [facility] in late August.  Electrolytes this morning reveal multiple electrolyte derangements including a magnesium of 1.2 and potassium of 3.3.  Advised nursing administer Tylenol for the patient's fever which is presumably secondary to pneumonia the patient is being treated for.  We will additionally provide patient with 4 g of intravenous magnesium sulfate as well as 40 mill equivalents of oral potassium chloride every 4 hours x2 doses.  Additionally, we will provide the patient with 2.5 mg of intravenous metoprolol in addition to patient's regularly scheduled oral metoprolol  regimen.  Concerning the etiology of the patient's atrial fibrillation (which seems to be paroxysmal) the onset of this episode of atrial fibrillation is likely secondary to underlying infection and electrolyte derangements.  We will additionally obtain TSH, D-dimer ( in the setting of holding Xarelto for the past 2 days) and echocardiogram later this morning.  Finally, it is noted from the day provider's notes that the patient's anticoagulation is being held due to concerns for gastrointestinal bleeding.  We will defer this to her on potential initiation of anticoagulation to day team upon arrival patient appears to have a [place] VASC score of 4.   Sherryll Burger Rivaan Kendall

## 2022-02-08 NOTE — Progress Notes (Signed)
PROGRESS NOTE  [name]  DOB: 1950/10/15  PCP: Redmond School, MD UXN:[id number]  DOA: 02/05/2022  LOS: 2 days  Hospital Day: 4  Brief narrative:  [name] is a 71 y.o. 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 Patient has had Nissen's fundoplication complicated by Barrett's esophagus, gastroparesis.  He has chronic intermittent nausea and vomiting for which he is on Reglan twice a day. For the last few months, he has more pronounced nausea and vomiting.  He would gag after eating or drinking anything.  He has been slowly getting dehydrated.  Lost about 23 pounds in 3 weeks.  He was recently seen by GI Dr. Jenetta Downer and underwent EGD on 8/1.  He was found to have known short segment Barrett's esophagus, erosive gastropathy with stigmata of recent bleeding.  He continued to have symptoms after that as well.  Over the period of last 3 weeks, patient has become significantly dehydrated and weak.   About 2 weeks ago, patient tested positive for COVID.  He had mild symptoms only.  He was prescribed Paxlovid but his pharmacy did not have it so he could not take it.   Over the last several days, patient has had nightly fever as well.   8/28, he had a fall sustaining right-sided rib pain.  He was seen in the ED.  Imaging at that time was negative and hence patient was discharged home.  9/6, patient returned to the ED with complaint of worsening symptoms.  In the ED, patient was afebrile, heart rate 108, blood pressure 144/90.  Hemoccult positive with hemoglobin at 12.4 less than his baseline of 15. CT chest, abdomen pelvis showed new extensive patchy infiltrates are seen in both lungs suggesting  multifocal pneumonia, minimal bilateral pleural effusions. Ultrasound abdomen showed early acute changes, normal gallbladder, no gallstones.  CBD 4 mm size. Admitted to [facility]. GI consult obtained.  Subjective: Patient was seen and examined this morning.  Lying on bed.  Not in distress. Pending barium studies. No chest pain at this time.  Even from last night noted.  Patient had 1 episode of low-grade temperature, RVR in low 100s and also had chest pain.  Troponin mildly elevated this morning. Wife at bedside.  Assessment/Plan: Principal Problem:   GI bleeding Active Problems:   COVID-19  Intractable nausea, vomiting History of Nissen fundoplication complicated by Barrett's esophagus Patient seems to be having a flareup of his chronic intermittent nausea vomiting.  He is significantly weak and dehydrated. He is on IV fluid, IV Reglan, IV Protonix. GI consult obtained.  Noted a plan of barium swallow. Clear liquid diet at this time but he barely has any appetite.  Normocytic anemia on Xarelto Suspect upper GI bleeding Baseline hemoglobin over 15.  Presented with hemoglobin 12.4.   Xarelto was kept on hold for any possible GI intervention.  GI consult obtained.  Hemoglobin seems gradually dropping.  9.9 this morning. Continue PPI Recent Labs    05/05/21 1024 05/05/21 2115 05/06/21 0406 06/19/21 0850 10/23/21 0952 01/27/22 1056 02/05/22 1023 02/06/22 0408 02/07/22 1509 02/08/22 0146  HGB 15.2 14.0 13.9 15.5 15.6 15.7 12.4* 11.8* 11.4* 9.9*   New onset A-fib Patient had a feeling of pounding of heart and chest pain last night.  EKG showed  A-fib at a rate of 102 bpm.  New onset A-fib.  Previously on Xarelto for DVT/PE.  Currently on hold because of drop in hemoglobin.  Continue telemetry monitoring Blood pressure in 90s.  I will hold off on increasing the dose of metoprolol at this time.  Elevated troponin History of mild CAD, ascending aortic aneurysm, HLD Troponin was  mildly elevated this morning in the setting of A-fib but had chest pain as well. Obtain echocardiogram. PTA on metoprolol, Ranexa, Xarelto, Crestor Continue metoprolol and Ranexa.  Resume Crestor when oral intake is improved.  Xarelto on hold because of drop in hemoglobin Recent Labs    02/08/22 0146 02/08/22 0803  TROPONINIHS 13 34*   Acute respiratory failure Multifocal pneumonia -suspect aspiration Recent COVID infection I suspect patient has chronic aspiration due to chronic abdominal nausea vomiting.  Wife reported nightly fever last few days. CT chest read as multifocal pneumonia Suspected aspiration pneumonia.  The radiology finding may be also partially secondary to recent COVID infection.   Procalcitonin level is not elevated however. Currently on all short course of IV Unasyn. Patient had low-grade temperature last night.  Only 1 episode of temperature 100.9.  Suspect stress related.  Continue to monitor Required supplemental oxygen on admission.  Currently not requiring at rest.  Check on ambulation. Recent Labs  Lab 02/05/22 1023 02/06/22 0408 02/07/22 0257 02/08/22 0146  WBC 3.3* 4.4  --  5.5  PROCALCITON  --  <0.10 <0.10  --    Essential hypertension PTA on metoprolol, lisinopril 10 mg daily Continue metoprolol.  Continue to hold lisinopril.  History of lymphoma, melanoma With oncologist at [facility].  Last chemotherapy was in 2022.  Recently followed-up.  Currently remission. PTA, patient seems to be on hydrocortisone 20 g in a.m. and 10 mg in p.m. Same has been resumed.  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 Currently on hold because of drop in hemoglobin  Fatty liver Ultrasound abdomen showed early acute changes, normal gallbladder, no gallstones.  CBD 4 mm size. Mildly elevated LFTs.  Continue to trend. Recent Labs  Lab 02/05/22 1023 02/06/22 0408  AST 53* 44*  ALT 52* 48*  ALKPHOS 46 49   BILITOT 1.1 1.1  PROT 6.3* 6.1*  ALBUMIN 3.2* 3.2*   IBS-C PTA on Motegrity, MiraLAX, Metamucil  Anxiety Valium as needed to continue  Mobility Encourage ambulation.  PT eval  Goals of care - -  Code Status: Full Code    Diet:  Diet Order             Diet clear liquid Room service appropriate? Yes; Fluid consistency: Thin  Diet effective now                  DVT prophylaxis:  SCDs Start: 02/05/22 1729   Antimicrobials: Unasyn Fluid: NS at 75 mill per hour to continue for now Consultants: GI Family Communication: Wife at bedside  Status is: Observation  Continue in-hospital care because: Still unable to eat, requires IV Protonix, IV Zofran.  Further GI studies pending Level of care: Telemetry Medical   Dispo: The patient is from: Home              Anticipated d/c is to: Pending clinical course              Patient currently is not medically stable to d/c.   Difficult to place patient No  Infusions:   sodium chloride 75 mL/hr at 02/07/22 2009   ampicillin-sulbactam (UNASYN) IV 3 g (02/08/22 0402)   methocarbamol (ROBAXIN) IV      Scheduled Meds:  albuterol  2.5 mg Nebulization BID   cyclobenzaprine  10 mg Oral Daily   hydrocortisone  10 mg Oral QPM   hydrocortisone  20 mg Oral Daily   metoCLOPramide (REGLAN) injection  10 mg Intravenous Q8H   metoprolol succinate  25 mg Oral Daily   montelukast  10 mg Oral Daily   ondansetron (ZOFRAN) IV  4 mg Intravenous Q6H   pantoprazole (PROTONIX) IV  40 mg Intravenous Q12H   polyethylene glycol  17 g Oral BID   potassium chloride  40 mEq Oral Q4H   psyllium  1 packet Oral Daily   ranolazine  500 mg Oral BID   senna  1 tablet Oral BID    PRN meds: acetaminophen **OR** acetaminophen, albuterol, bisacodyl, diazepam, hydrALAZINE, methocarbamol (ROBAXIN) IV, oxyCODONE   Antimicrobials: Anti-infectives (From admission, onward)    Start     Dose/Rate Route Frequency Ordered Stop   02/06/22 0400   Ampicillin-Sulbactam (UNASYN) 3 g in sodium chloride 0.9 % 100 mL IVPB        3 g 200 mL/hr over 30 Minutes Intravenous Every 6 hours 02/05/22 2214 02/11/22 0359   02/05/22 1830  Ampicillin-Sulbactam (UNASYN) 3 g in sodium chloride 0.9 % 100 mL IVPB  Status:  Discontinued        3 g 200 mL/hr over 30 Minutes Intravenous Every 6 hours 02/05/22 1822 02/05/22 2214       Objective: Vitals:   02/08/22 0527 02/08/22 0728  BP: 99/68 (!) 98/53  Pulse: (!) 102 74  Resp: 20 15  Temp: 98.8 F (37.1 C) 98.3 F (36.8 C)  SpO2: 94% 90%   No intake or output data in the 24 hours ending 02/08/22 1144 Filed Weights   02/05/22 0954 02/06/22 1600  Weight: 113 kg 112 kg   Weight change:  Body mass index is 33.49 kg/m.   Physical Exam: General exam: Pleasant, elderly Caucasian male.  Looks sad.  Not in pain at rest Skin: No rashes, lesions or ulcers. HEENT: Atraumatic, normocephalic, no obvious bleeding Lungs: Diminished air entry in both bases.  No crackles or wheezing CVS: Regular rate and rhythm, no murmur GI/Abd soft, distended from obesity, nontender, bowel sound present CNS: Alert, awake, oriented x3 Psychiatry: Sad affect Extremities: No pedal edema, no calf tenderness  Data Review: I have personally reviewed the laboratory data and studies available.  F/u labs ordered Unresulted Labs (From admission, onward)     Start     Ordered   02/09/22 0500  CBC with Differential/Platelet  Tomorrow morning,   R        02/08/22 1144   02/09/22 8416  Basic metabolic panel  Tomorrow morning,   R        02/08/22 1144            Signed, Terrilee Croak, MD [facility] 02/08/2022

## 2022-02-08 NOTE — Progress Notes (Signed)
Modified Barium Swallow Progress Note  Patient Details  Name: [name] MRN: [id number] Date of Birth: 1950-07-02  Today's Date: 02/08/2022  Modified Barium Swallow completed.  Full report located under Chart Review in the Imaging Section.  Brief recommendations include the following:  Clinical Impression  Pt participated in MBS. Unfortunately, after the exam, it was revealed that fluoroscopic images did not record due to equipment malfunction.  Pt's oral phase was WNL. There was no aspiration, only occasional high penetration of thin liquids (Penetration/aspiration scale score of 3 - trace amounts of penetrant lined inferior epiglottis, did not reach vocal folds).  Pt swallowed a 61m pill without incident; no aspiration. There was presence of prevertebral vs. posterior pharyngeal wall tissue at the level of the larynx that was viewed in real time but could not be identified after the study given absence of image recording. Overall, there was good mobility of the hyolaryngeal complex, adequate transition of POs through UES, and no noted aspiration on today's exam. Doubt prandial aspiration as contributor to current condition. Recommend advancing diet per GI.   Swallow Evaluation Recommendations       SLP Diet Recommendations: Other (Comment) (advnce per MD)   Liquid Administration via: Cup;Straw   Medication Administration: Whole meds with liquid   Supervision: Patient able to self feed           Oral Care Recommendations: Oral care BID      Stephen Baruch L. CTivis Ringer MA CCC/SLP Clinical Specialist - Acute Care SLP Acute Rehabilitation Services Office number [phone]  [name] 02/08/2022,1:19 PM

## 2022-02-08 NOTE — Progress Notes (Signed)
  Echocardiogram 2D Echocardiogram has been performed.  [name] 02/08/2022, 3:58 PM

## 2022-02-08 NOTE — Progress Notes (Addendum)
Progress Note  Primary GI: Dr. Briant Cedar   Subjective  Chief Complaint: GI bleeding  Patient lying in bed, wife at bedside. Overnight patient had episode of A-fib with RVR precipitated by fever.  Results pending. Patient states once he got fever down patient feels much better. Still on 2 L nasal cannula with shortness of breath however states his nausea and vomiting have improved greatly and he is wanting to try food. Denies abdominal pain. Patient is has not had a bowel movement, not uncommon for him due to history of constipation.    Objective   Vital signs in last 24 hours: Temp:  [98.1 F (36.7 C)-100.9 F (38.3 C)] 98.5 F (36.9 C) (09/09 1209) Pulse Rate:  [73-103] 73 (09/09 1209) Resp:  [15-20] 17 (09/09 1209) BP: (90-124)/(50-80) 90/60 (09/09 1209) SpO2:  [90 %-99 %] 93 % (09/09 1209) Last BM Date : 02/06/22 Last BM recorded by nurses in past 5 days No data recorded  General: Morbidly obese, chronically ill-appearing male  Heart:  regular rate and rhythm, systolic murmur Pulm: Clear anteriorly, 2 L nasal cannula, decreased breath sounds at the bases. Abdomen:  Soft, Obese AB, large vertical scar, Sluggish to hypoactive bowel sounds. No tenderness . Without guarding and Without rebound, No organomegaly appreciated. Extremities:  With mild bilateral edema. Msk:  Symmetrical without gross deformities. Peripheral pulses intact.  Neurologic:  Alert and  oriented x4;  Skin:   Dry and intact without significant lesions or rashes.  Ecchymosis bilateral arms Psychiatric:  Cooperative.  Normal affect.  Intake/Output from previous day: No intake/output data recorded. Intake/Output this shift: No intake/output data recorded.  Studies/Results: No results found.  Lab Results: Recent Labs    02/06/22 0408 02/07/22 1509 02/08/22 0146  WBC 4.4  --  5.5  HGB 11.8* 11.4* 9.9*  HCT 34.4* 33.4* 28.8*  PLT 124*  --  103*   BMET Recent Labs    02/06/22 0408  02/08/22 0146  NA 140 140  K 3.3* 3.3*  CL 106 110  CO2 24 22  GLUCOSE 87 86  BUN 12 10  CREATININE 1.22 0.95  CALCIUM 8.5* 7.0*   LFT Recent Labs    02/06/22 0408  PROT 6.1*  ALBUMIN 3.2*  AST 44*  ALT 48*  ALKPHOS 49  BILITOT 1.1  BILIDIR 0.3*  IBILI 0.8   PT/INR Recent Labs    02/06/22 0958  LABPROT 15.9*  INR 1.3*       Impression/Plan:   Nausea, vomiting, poor oral intake leading to malnutrition Long history of nausea vomiting, follows with Dr. Briant Cedar outpatient Last EGD 12/31/2021 for nausea/vomiting showed negative H. pylori gastritis with stigmata of recent bleeding, known short Barrett's esophagus 2019 colonoscopy with 4 diminutive tubular adenomas, tics and hemorrhoids Known history of gastroparesis Current infection with multifocal pneumonia, recent COVID Status has improved greatly, patient has not had any vomiting Kozma nausea improved, has increased appetite. We will advance diet and see how patient does over night and tomorrow  With recent EGD and current pneumonia requiring oxygen and weakness would suggest continuing conservative management. Continue Protonix 40 mg twice daily IV, Reglan 10 mg every 8 hours, Zofran scheduled. Unremarkable MBS, tablet passed without issues   Normocytic anemia on Xarelto currently on hold HGB 11.8 Platelets 124 Positive Hemoccult per provider with rectal exam however fecal occult blood sent to the lab is negative, like positive rectal FOBT due to trauma or false positive Baseline hemoglobin 15, currently 11.8, no elevation of  BUN. Patient did admit to taking 1 goody powder 1 week ago for HA No active GI bleeding, no BM Continue to monitor Transfuse if below 7  Chronic idiopathic Constipation Long history, was on [place] outpatient, currently not on formulary. Has just been on Senokot 2 times daily We will add on MiraLAX twice daily We will add on Linzess to 90 mcg daily Dulcolax suppository as  needed  Atrial fibrillation with RVR Patient has history of paroxysmal atrial fibrillation previously Pending echocardiogram   Acute respiratory failure with hypoxia secondary multifocal pneumonia, recent COVID infection, possible aspiration On 2 L nasal cannula, no oxygen dependence at home. 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   Elevated LFTs with history of fatty liver Right upper quadrant abdominal ultrasound suspected mild nodularity hepatic contour nonspecific could be early cirrhotic changes. CT shows fatty infiltration, gallbladder distended no wall thickening no ductal dilatation does show splenomegaly.  Diverticulosis without diverticulitis. 06/14/2020 elastography with median K PA 3.1 high probability of being normal. With nodularity, splenomegaly, thrombocytopenia possible this could be early cirrhosis. INR 1.3, ammonia low. We will need outpatient follow-up.    LOS: 2 days   Vladimir Crofts  02/08/2022, 2:57 PM    Attending physician's note   I have taken a history, reviewed the chart and examined the patient. I performed a substantive portion of this encounter, including complete performance of at least one of the key components, in conjunction with the APP. I agree with the APP's note, impression and recommendations.    Overall feels better, he wants to eat, feels his appetite is back Modified barium swallow with no significant pathology or aspiration risk  Advance diet as tolerated Small frequent meals with high-protein diet He has known history of Barrett's esophagus with no dysplasia and gastroparesis, is followed by GI in [place] at [facility]  Chronic idiopathic constipation, will order Linzess 290 mcg daily  Noted slight decline in hemoglobin, no signs of overt GI bleeding, possible hemodilution Continue to  monitor No plan for endoscopic evaluation at this point  GI will sign off but is available if needed, please call with any questions  The patient was provided an opportunity to ask questions and all were answered. The patient agreed with the plan and demonstrated an understanding of the instructions.  [name] , MD [phone]

## 2022-02-08 NOTE — Progress Notes (Addendum)
Patient states that he is having right sided chest pain and he is very anxious. He describes the chest pain as crushing and says that it feels differently than the pain he had about 2 weeks ago after his fall where he injured his ribs on the right side. MD notified. EKG obtained. Upon additional assessment, patient denies chest pain.  0000- Patient's daughter called stating that he called her and he "was talking out of his head." Patient's wife at the bedside and states that he has been saying some unusual things. Upon assessment, patient found to be oriented only to self. There was no extremity weakness, patient's speech clear, and no facial droop noted. MD notified. Of note, patient had been given valium about an hour prior.

## 2022-02-09 ENCOUNTER — Inpatient Hospital Stay (HOSPITAL_COMMUNITY): Payer: Medicare Other

## 2022-02-09 DIAGNOSIS — K922 Gastrointestinal hemorrhage, unspecified: Secondary | ICD-10-CM | POA: Diagnosis not present

## 2022-02-09 LAB — BASIC METABOLIC PANEL
Anion gap: 7 (ref 5–15)
BUN: 13 mg/dL (ref 8–23)
CO2: 22 mmol/L (ref 22–32)
Calcium: 7.7 mg/dL — ABNORMAL LOW (ref 8.9–10.3)
Chloride: 108 mmol/L (ref 98–111)
Creatinine, Ser: 1.13 mg/dL (ref 0.61–1.24)
GFR, Estimated: >60 mL/min (ref >60)
Glucose, Bld: 110 mg/dL — ABNORMAL HIGH (ref 70–99)
Potassium: 4.2 mmol/L (ref 3.5–5.1)
Sodium: 137 mmol/L (ref 135–145)

## 2022-02-09 LAB — BPAM RBC
Blood Product Expiration Date: 202309272359
Blood Product Expiration Date: 202309292359
Unit Type and Rh: 6200
Unit Type and Rh: 6200

## 2022-02-09 LAB — TYPE AND SCREEN
ABO/RH(D): A POS
Antibody Screen: POSITIVE
Unit division: 0
Unit division: 0

## 2022-02-09 LAB — CBC WITH DIFFERENTIAL/PLATELET
Abs Immature Granulocytes: 0.04 10*3/uL (ref 0.00–0.07)
Basophils Absolute: 0 10*3/uL (ref 0.0–0.1)
Basophils Relative: 0 %
Eosinophils Absolute: 0 10*3/uL (ref 0.0–0.5)
Eosinophils Relative: 0 %
HCT: 31.4 % — ABNORMAL LOW (ref 39.0–52.0)
Hemoglobin: 10.5 g/dL — ABNORMAL LOW (ref 13.0–17.0)
Immature Granulocytes: 1 %
Lymphocytes Relative: 9 %
Lymphs Abs: 0.3 10*3/uL — ABNORMAL LOW (ref 0.7–4.0)
MCH: 31.1 pg (ref 26.0–34.0)
MCHC: 33.4 g/dL (ref 30.0–36.0)
MCV: 92.9 fL (ref 80.0–100.0)
Monocytes Absolute: 0.6 10*3/uL (ref 0.1–1.0)
Monocytes Relative: 17 %
Neutro Abs: 2.7 10*3/uL (ref 1.7–7.7)
Neutrophils Relative %: 73 %
Platelets: 113 10*3/uL — ABNORMAL LOW (ref 150–400)
RBC: 3.38 MIL/uL — ABNORMAL LOW (ref 4.22–5.81)
RDW: 12.9 % (ref 11.5–15.5)
WBC: 3.8 10*3/uL — ABNORMAL LOW (ref 4.0–10.5)
nRBC: 0 % (ref 0.0–0.2)

## 2022-02-09 MED ORDER — TRAMADOL HCL 50 MG PO TABS
50.0000 mg | ORAL_TABLET | Freq: Four times a day (QID) | ORAL | Status: DC | PRN
  Administered 2022-02-09 – 2022-02-10 (×3): 50 mg via ORAL
  Filled 2022-02-09 (×4): qty 1

## 2022-02-09 MED ORDER — LIDOCAINE 5 % EX PTCH
1.0000 | MEDICATED_PATCH | CUTANEOUS | Status: DC
  Administered 2022-02-09 – 2022-02-10 (×2): 1 via TRANSDERMAL
  Filled 2022-02-09 (×4): qty 1

## 2022-02-09 MED ORDER — ROSUVASTATIN CALCIUM 5 MG PO TABS
5.0000 mg | ORAL_TABLET | Freq: Every evening | ORAL | Status: DC
  Administered 2022-02-09 – 2022-02-13 (×5): 5 mg via ORAL
  Filled 2022-02-09 (×5): qty 1

## 2022-02-09 MED ORDER — AMOXICILLIN-POT CLAVULANATE 875-125 MG PO TABS
1.0000 | ORAL_TABLET | Freq: Two times a day (BID) | ORAL | Status: DC
  Administered 2022-02-09 – 2022-02-12 (×6): 1 via ORAL
  Filled 2022-02-09 (×6): qty 1

## 2022-02-09 MED ORDER — RIVAROXABAN 20 MG PO TABS
20.0000 mg | ORAL_TABLET | Freq: Every day | ORAL | Status: DC
  Administered 2022-02-09 – 2022-02-13 (×5): 20 mg via ORAL
  Filled 2022-02-09 (×5): qty 1

## 2022-02-09 NOTE — Progress Notes (Signed)
PROGRESS NOTE  [name]  DOB: [birth year]  PCP: Redmond School, MD POE:[id number]  DOA: 02/05/2022  LOS: 3 days  Hospital Day: 5  Brief narrative:  [name] is a 71 y.o. 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 Patient has had Nissen's fundoplication complicated by Barrett's esophagus, gastroparesis.  He has chronic intermittent nausea and vomiting for which he is on Reglan twice a day. For the last few months, he has more pronounced nausea and vomiting.  He would gag after eating or drinking anything.  He has been slowly getting dehydrated.  Lost about 23 pounds in 3 weeks.  He was recently seen by GI Dr. Jenetta Downer and underwent EGD on 8/1.  He was found to have known short segment Barrett's esophagus, erosive gastropathy with stigmata of recent bleeding.  He continued to have symptoms after that as well.  Over the period of last 3 weeks, patient has become significantly dehydrated and weak.   About 2 weeks ago, patient tested positive for COVID.  He had mild symptoms only.  He was prescribed Paxlovid but his pharmacy did not have it so he could not take it.   Over the last several days, patient has had nightly fever as well.   8/28, he had a fall sustaining right-sided rib pain.  He was seen in the ED.  Imaging at that time was negative and hence patient was discharged home.  9/6, patient returned to the ED with complaint of worsening symptoms.  In the ED, patient was afebrile, heart rate 108, blood pressure 144/90.  Hemoccult positive with hemoglobin at 12.4 less than his baseline of 15. CT chest, abdomen pelvis showed new extensive patchy infiltrates are seen in both lungs suggesting  multifocal pneumonia, minimal bilateral pleural effusions. Ultrasound abdomen showed early acute changes, normal gallbladder, no gallstones.  CBD 4 mm size. Admitted to [facility]. GI consult obtained.  Subjective: Patient was seen and examined this morning.  At the time of my evaluation this morning, patient just had a panic attack.  He had tremors his heart rate was elevated.  Wife at bedside.  He also have pain on the right side of the chest especially with movement and coughing.  Assessment/Plan: Principal Problem:   GI bleeding Active Problems:   COVID-19   Multifocal pneumonia  Intractable nausea, vomiting History of Nissen fundoplication complicated by Barrett's esophagus Patient seems to be having a flareup of his chronic intermittent nausea vomiting.  He is significantly weak and dehydrated. He is on IV fluid, IV Reglan, IV Protonix. GI consult was obtained.  Modified barium swallow without any significant pathology or aspiration risk.. Able to tolerate a consistent diet last night  Normocytic anemia on Xarelto Suspect upper GI bleeding Baseline hemoglobin over 15.  Presented with hemoglobin 12.4.   Xarelto was kept on hold for any possible GI intervention.  GI consult obtained.  Hemoglobin seems gradually dropping and hence Xarelto was kept on hold.  Stable hemoglobin today.  Resume Xarelto. Continue PPI Recent Labs    05/05/21 2115 05/06/21 0406 06/19/21 0850 10/23/21 0952 01/27/22 1056 02/05/22 1023 02/06/22 0408 02/07/22 1509 02/08/22 0146 02/09/22 0233  HGB 14.0 13.9 15.5 15.6 15.7 12.4* 11.8* 11.4* 9.9* 10.5*   New onset A-fib Patient had a feeling of  pounding of heart and chest pain intermittently.  9/9, EKG showed A-fib at a rate of 102 bpm.  New onset A-fib.  Previously on Xarelto for DVT/PE.  Resume Xarelto today Continue telemetry monitoring Elevated troponin  Right-sided chest wall pain Because of fall last week at home.  Lidocaine patch ordered.   Patient also willing to try tramadol today  Anxiety Valium as needed to continue  History of mild CAD, ascending aortic aneurysm, HLD Troponin was mildly elevated this morning in the setting of A-fib but had chest pain as well. Obtain echocardiogram. PTA on metoprolol, Ranexa, Xarelto, Crestor Continue metoprolol and Ranexa.  Resume both Crestor and Xarelto Recent Labs    02/08/22 0146 02/08/22 0803  TROPONINIHS 13 34*   Acute respiratory failure Multifocal pneumonia -suspect aspiration Recent COVID infection I suspect patient has chronic aspiration due to chronic abdominal nausea vomiting.  Wife reported nightly fever last few days. CT chest read as multifocal pneumonia Suspected aspiration pneumonia.  The radiology finding may be also partially secondary to recent COVID infection.   Procalcitonin level is not elevated however. Currently on all short course of IV Unasyn. No fever in last 24 hours.  WBC count normal. Required supplemental oxygen on admission.  Currently not requiring at rest.  Check on ambulation. Recent Labs  Lab 02/05/22 1023 02/06/22 0408 02/07/22 0257 02/08/22 0146 02/09/22 0233  WBC 3.3* 4.4  --  5.5 3.8*  PROCALCITON  --  <0.10 <0.10  --   --    Essential hypertension PTA on metoprolol, lisinopril 10 mg daily Continue metoprolol.  Continue to hold lisinopril.  History of lymphoma, melanoma With oncologist at [facility].  Last chemotherapy was in 2022.  Recently followed-up.  Currently remission. PTA, patient seems to be on hydrocortisone 20 g in a.m. and 10 mg in p.m. Same has been resumed.  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 Currently on hold because of drop in hemoglobin  Fatty liver Ultrasound abdomen showed early acute changes, normal gallbladder, no gallstones.  CBD 4 mm size. Mildly elevated LFTs.  Continue to trend. Recent Labs  Lab 02/05/22 1023 02/06/22 0408  AST 53*  44*  ALT 52* 48*  ALKPHOS 46 49  BILITOT 1.1 1.1  PROT 6.3* 6.1*  ALBUMIN 3.2* 3.2*   IBS-C PTA on Motegrity, MiraLAX, Metamucil  Mobility Encourage ambulation.  PT eval  Goals of care - -  Code Status: Full Code    Diet:  Diet Order             Diet regular Room service appropriate? Yes; Fluid consistency: Thin  Diet effective now                  DVT prophylaxis:  SCDs Start: 02/05/22 1729 rivaroxaban (XARELTO) tablet 20 mg   Antimicrobials: Unasyn Fluid: Stop IV fluid Consultants: GI Family Communication: Wife at bedside  Status is: Inpatient  Continue in-hospital care because: Panic attack this morning.     Level of care: Telemetry Medical   Dispo: The patient is from: Home              Anticipated d/c is to: Pending clinical course              Patient currently is not medically stable to d/c.   Difficult to place patient No     Infusions:   ampicillin-sulbactam (UNASYN) IV 3 g (02/09/22 1159)   methocarbamol (  ROBAXIN) IV      Scheduled Meds:  albuterol  2.5 mg Nebulization BID   cyclobenzaprine  10 mg Oral Daily   hydrocortisone  10 mg Oral QPM   hydrocortisone  20 mg Oral Daily   lidocaine  1 patch Transdermal Q24H   linaclotide  290 mcg Oral QAC breakfast   metoprolol succinate  25 mg Oral Daily   montelukast  10 mg Oral Daily   ondansetron (ZOFRAN) IV  4 mg Intravenous Q6H   pantoprazole (PROTONIX) IV  40 mg Intravenous Q12H   polyethylene glycol  17 g Oral BID   psyllium  1 packet Oral Daily   ranolazine  500 mg Oral BID   rivaroxaban  20 mg Oral Q supper   rosuvastatin  5 mg Oral QPM   senna  1 tablet Oral BID    PRN meds: acetaminophen **OR** acetaminophen, albuterol, bisacodyl, diazepam, hydrALAZINE, methocarbamol (ROBAXIN) IV, prochlorperazine, traMADol   Antimicrobials: Anti-infectives (From admission, onward)    Start     Dose/Rate Route Frequency Ordered Stop   02/06/22 0400  Ampicillin-Sulbactam (UNASYN) 3 g in sodium  chloride 0.9 % 100 mL IVPB        3 g 200 mL/hr over 30 Minutes Intravenous Every 6 hours 02/05/22 2214 02/11/22 0359   02/05/22 1830  Ampicillin-Sulbactam (UNASYN) 3 g in sodium chloride 0.9 % 100 mL IVPB  Status:  Discontinued        3 g 200 mL/hr over 30 Minutes Intravenous Every 6 hours 02/05/22 1822 02/05/22 2214       Objective: Vitals:   02/09/22 0400 02/09/22 0924  BP: 119/74   Pulse: 68   Resp: 19   Temp: 97.7 F (36.5 C)   SpO2: 96% 92%    Intake/Output Summary (Last 24 hours) at 02/09/2022 1427 Last data filed at 02/09/2022 0600 Gross per 24 hour  Intake --  Output 600 ml  Net -600 ml   Filed Weights   02/05/22 0954 02/06/22 1600  Weight: 113 kg 112 kg   Weight change:  Body mass index is 33.49 kg/m.   Physical Exam: General exam: Pleasant, elderly Caucasian male.  Looks sad.  He was going through panic attack with tremors, tachycardia, sweating Skin: No rashes, lesions or ulcers. HEENT: Atraumatic, normocephalic, no obvious bleeding Lungs: Diminished air entry in both bases.  No crackles or wheezing CVS: Regular rate and rhythm, no murmur GI/Abd soft, distended from obesity, nontender, bowel sound present CNS: Alert, awake, oriented x3 Psychiatry: Mood appropriate Extremities: No pedal edema, no calf tenderness  Data Review: I have personally reviewed the laboratory data and studies available.  F/u labs ordered Unresulted Labs (From admission, onward)    None       Signed, Terrilee Croak, MD [facility] 02/09/2022

## 2022-02-09 NOTE — Evaluation (Signed)
Physical Therapy Evaluation Patient Details Name: [name] MRN: [id number] DOB: May 12, 1951 Today's Date: 02/09/2022  History of Present Illness  Pt is a 71 y.o. male admitted 9/6 with dx of GI bleed and PNA. Recent Covid+ in August. He presented to the ED with c/o N&V, and R rib pain following 8/28 fall at home. Xray negative for rib fxs. PMH:  Nissen's fundoplication complicated by Barrett's esophagus, gastroparesis, chronic intermittent nausea/vomiting HTN, HLD, mild CAD, ascending aortic aneurysm, splenic lymphoma, DVT/PE on Xarelto since 05/2021, fatty liver, anxiety, multiple abdominal surgeries, multi back sx, chronic back pain   Clinical Impression  Pt admitted with above diagnosis. PTA pt lived at home with his wife, I/mod I mobility and ADLs. No home O2 at baseline. Pt currently with functional limitations due to the deficits listed below (see PT Problem List). On eval, he required mod assist bed mobility, mod assist sit to stand, and mod assist sidestepping with RW 3' bedside. Mobility limited by fatigue and tachycardia. Pt on 4L O2 throughout session with SpO2 90-92%. Resting HR in 100s. Up to 140s during mobility. Pt will benefit from skilled PT to increase their independence and safety with mobility to allow discharge to the venue listed below.  As medical symptoms are managed, suspect pt will progress well with mobility.  Wife present in room and agreeable with plan.         Recommendations for follow up therapy are one component of a multi-disciplinary discharge planning process, led by the attending physician.  Recommendations may be updated based on patient status, additional functional criteria and insurance authorization.  Follow Up Recommendations Home health PT      Assistance Recommended at Discharge Frequent or constant Supervision/Assistance  Patient can return home with the following  A lot of help with walking and/or transfers;A lot of help with  bathing/dressing/bathroom;Assistance with cooking/housework;Help with stairs or ramp for entrance;Assist for transportation;Direct supervision/assist for medications management    Equipment Recommendations None recommended by PT  Recommendations for Other Services       Functional Status Assessment Patient has had a recent decline in their functional status and demonstrates the ability to make significant improvements in function in a reasonable and predictable amount of time.     Precautions / Restrictions Precautions Precautions: Fall;Other (comment) Precaution Comments: watch vitals      Mobility  Bed Mobility Overal bed mobility: Needs Assistance Bed Mobility: Rolling, Sidelying to Sit, Sit to Sidelying Rolling: Modified independent (Device/Increase time) Sidelying to sit: Mod assist, HOB elevated     Sit to sidelying: Mod assist, HOB elevated General bed mobility comments: increased time, +rail, cues for sequencing    Transfers Overall transfer level: Needs assistance Equipment used: Rolling walker (2 wheels) Transfers: Sit to/from Stand Sit to Stand: Mod assist           General transfer comment: cues for hand placement and sequencing, assist to power up    Ambulation/Gait Ambulation/Gait assistance: Mod assist Gait Distance (Feet): 3 Feet Assistive device: Rolling walker (2 wheels) Gait Pattern/deviations: Step-to pattern       General Gait Details: sidestepping bedside, pt pushing RW too far in front, inconsistently following directions for sequencing, fatigues quickly  Stairs            Wheelchair Mobility    Modified Rankin (Stroke Patients Only)       Balance Overall balance assessment: Needs assistance Sitting-balance support: No upper extremity supported, Feet supported Sitting balance-Leahy Scale: Fair  Standing balance support: During functional activity, Bilateral upper extremity supported, Reliant on assistive device for  balance Standing balance-Leahy Scale: Poor                               Pertinent Vitals/Pain Pain Assessment Pain Assessment: Faces Faces Pain Scale: Hurts little more Pain Location: generalized with mobility-primarily chronic back pain Pain Descriptors / Indicators: Grimacing, Guarding, Discomfort, Moaning Pain Intervention(s): Monitored during session, Repositioned    Home Living Family/patient expects to be discharged to:: Private residence Living Arrangements: Spouse/significant other Available Help at Discharge: Family;Available 24 hours/day Type of Home: House Home Access: Stairs to enter Entrance Stairs-Rails: Left Entrance Stairs-Number of Steps: 4 Alternate Level Stairs-Number of Steps: 2 steps into den, flight of steps to second level Home Layout: Two level;Able to live on main level with bedroom/bathroom Home Equipment: Rolling Walker (2 wheels);Cane - single point      Prior Function Prior Level of Function : Independent/Modified Independent             Mobility Comments: cane for amb outside the home, uses the motorized scooters at the grocery store       Plummer Hand: Right    Extremity/Trunk Assessment   Upper Extremity Assessment Upper Extremity Assessment: Generalized weakness    Lower Extremity Assessment Lower Extremity Assessment: Generalized weakness    Cervical / Trunk Assessment Cervical / Trunk Assessment: Other exceptions Cervical / Trunk Exceptions: h/o back surgeries  Communication   Communication: No difficulties  Cognition Arousal/Alertness: Awake/alert Behavior During Therapy: WFL for tasks assessed/performed Overall Cognitive Status: Impaired/Different from baseline Area of Impairment: Attention, Memory, Following commands, Safety/judgement, Problem solving, Awareness                   Current Attention Level: Sustained Memory: Decreased short-term memory Following Commands: Follows  one step commands with increased time Safety/Judgement: Decreased awareness of safety Awareness: Emergent Problem Solving: Slow processing, Difficulty sequencing, Requires verbal cues General Comments: very witty. Likes to joke        General Comments General comments (skin integrity, edema, etc.): SpO2 90-92% on 4L during session. Resting HR in 100s. Up to 140s during mobility.    Exercises     Assessment/Plan    PT Assessment Patient needs continued PT services  PT Problem List Decreased strength;Decreased mobility;Decreased safety awareness;Decreased knowledge of precautions;Decreased activity tolerance;Decreased cognition;Pain;Decreased balance       PT Treatment Interventions DME instruction;Therapeutic activities;Cognitive remediation;Gait training;Therapeutic exercise;Patient/family education;Balance training;Functional mobility training;Stair training    PT Goals (Current goals can be found in the Care Plan section)  Acute Rehab PT Goals Patient Stated Goal: home PT Goal Formulation: With patient/family Time For Goal Achievement: 02/23/22 Potential to Achieve Goals: Good    Frequency Min 3X/week     Co-evaluation               AM-PAC PT "6 Clicks" Mobility  Outcome Measure Help needed turning from your back to your side while in a flat bed without using bedrails?: A Little Help needed moving from lying on your back to sitting on the side of a flat bed without using bedrails?: A Lot Help needed moving to and from a bed to a chair (including a wheelchair)?: A Lot Help needed standing up from a chair using your arms (e.g., wheelchair or bedside chair)?: A Lot Help needed to walk in hospital room?: A Lot Help needed climbing  3-5 steps with a railing? : Total 6 Click Score: 12    End of Session Equipment Utilized During Treatment: Gait belt;Oxygen Activity Tolerance: Patient limited by fatigue;Treatment limited secondary to medical complications (Comment)  (tachycardia) Patient left: in bed;with call bell/phone within reach;with family/visitor present Nurse Communication: Mobility status PT Visit Diagnosis: Muscle weakness (generalized) (M62.81);Other abnormalities of gait and mobility (R26.89);Pain    Time: 9449-6759 PT Time Calculation (min) (ACUTE ONLY): 25 min   Charges:   PT Evaluation $PT Eval Moderate Complexity: 1 Mod PT Treatments $Gait Training: 8-22 mins        Lorrin Goodell, PT  Office # [phone] Pager [phone]   Lorriane Shire 02/09/2022, 2:45 PM

## 2022-02-09 NOTE — Progress Notes (Signed)
Another consult was placed to IV Therapy for new access;  pt has had multiple restarts:  1 on Saturday, and 2 new ivs today that have both infiltrated;  pt with very poor peripheral access;  pt has had a PICC in the past, placed at [facility] approx a year ago;  secure chat sent to Dr Pietro Cassis explaining situation;  pt to be placed on PO antibiotic, and will leave IV out;  Georgina Peer, RN aware.  Orders to be placed.  Thank you.

## 2022-02-10 ENCOUNTER — Inpatient Hospital Stay (HOSPITAL_COMMUNITY): Payer: Medicare Other

## 2022-02-10 DIAGNOSIS — K922 Gastrointestinal hemorrhage, unspecified: Secondary | ICD-10-CM | POA: Diagnosis not present

## 2022-02-10 MED ORDER — HYDROCODONE-ACETAMINOPHEN 10-325 MG PO TABS: 1.0000 | ORAL_TABLET | Freq: Four times a day (QID) | ORAL | Status: DC | PRN

## 2022-02-10 MED ORDER — CYCLOBENZAPRINE HCL 5 MG PO TABS
10.0000 mg | ORAL_TABLET | Freq: Two times a day (BID) | ORAL | Status: DC
  Administered 2022-02-10: 10 mg via ORAL
  Filled 2022-02-10: qty 2

## 2022-02-10 NOTE — Care Management Important Message (Signed)
Important Message  Patient Details  Name: [name] MRN: [id number] Date of Birth: 02-21-1951   Medicare Important Message Given:  Yes     Janiyha Montufar Montine Circle 02/10/2022, 3:52 PM

## 2022-02-10 NOTE — Progress Notes (Signed)
Physical Therapy Treatment Patient Details Name: [name] MRN: [id number] DOB: 12/22/1950 Today's Date: 02/10/2022   History of Present Illness Pt is a 71 y.o. male admitted 9/6 with dx of GI bleed and PNA. Recent Covid+ in August. He presented to the ED with c/o N&V, and R rib pain following 8/28 fall at home. Xray negative for rib fxs. PMH:  Nissen's fundoplication complicated by Barrett's esophagus, gastroparesis, chronic intermittent nausea/vomiting HTN, HLD, mild CAD, ascending aortic aneurysm, splenic lymphoma, DVT/PE on Xarelto since 05/2021, fatty liver, anxiety, multiple abdominal surgeries, multi back sx, chronic back pain    PT Comments    Pt sleeping upon arrival to room, initially disoriented to location and reason for admission upon waking and complaining of significant back pain. PT encouraged mobility to improve back pain, pt difficult to motivate even with wife encouraging as well. Pt requiring min assist for transfer into standing at EOB, refused further mobility given back pain. Will continue to follow.     Recommendations for follow up therapy are one component of a multi-disciplinary discharge planning process, led by the attending physician.  Recommendations may be updated based on patient status, additional functional criteria and insurance authorization.  Follow Up Recommendations  Home health PT     Assistance Recommended at Discharge Frequent or constant Supervision/Assistance  Patient can return home with the following A lot of help with walking and/or transfers;A lot of help with bathing/dressing/bathroom;Assistance with cooking/housework;Help with stairs or ramp for entrance;Assist for transportation;Direct supervision/assist for medications management   Equipment Recommendations  None recommended by PT    Recommendations for Other Services       Precautions / Restrictions Precautions Precautions: Fall;Other (comment) Precaution Comments: watch  vitals Restrictions Weight Bearing Restrictions: No     Mobility  Bed Mobility Overal bed mobility: Needs Assistance Bed Mobility: Rolling, Sidelying to Sit, Sit to Sidelying Rolling: Min assist Sidelying to sit: Min assist     Sit to sidelying: Min assist General bed mobility comments: assist for trunk elevation/lowering, increased time and only open to rolling towards L given back pain    Transfers Overall transfer level: Needs assistance Equipment used: Rolling walker (2 wheels) Transfers: Sit to/from Stand Sit to Stand: Min assist           General transfer comment: assist to steady only, pt able to semi-rise to standing before sitting back down given fatigue and back pain    Ambulation/Gait                   Stairs             Wheelchair Mobility    Modified Rankin (Stroke Patients Only)       Balance Overall balance assessment: Needs assistance Sitting-balance support: No upper extremity supported, Feet supported Sitting balance-Leahy Scale: Fair     Standing balance support: During functional activity, Bilateral upper extremity supported, Reliant on assistive device for balance Standing balance-Leahy Scale: Poor                              Cognition Arousal/Alertness: Awake/alert Behavior During Therapy: WFL for tasks assessed/performed Overall Cognitive Status: Impaired/Different from baseline Area of Impairment: Attention, Memory, Following commands, Safety/judgement, Problem solving, Awareness                   Current Attention Level: Sustained Memory: Decreased short-term memory Following Commands: Follows one step commands with increased time Safety/Judgement:  Decreased awareness of safety Awareness: Emergent Problem Solving: Slow processing, Difficulty sequencing, Requires verbal cues General Comments: drowsy, difficult to motivate this date. Pt states he is at home, is oriented to hospital by PT         Exercises      General Comments General comments (skin integrity, edema, etc.): 3LO2 via  - pt took it off during transfer and SPO2 maintained 88% and greater      Pertinent Vitals/Pain Pain Assessment Pain Assessment: Faces Faces Pain Scale: Hurts even more Pain Location: back, chronic Pain Descriptors / Indicators: Grimacing, Guarding, Discomfort, Moaning Pain Intervention(s): Limited activity within patient's tolerance, Monitored during session, Repositioned    Home Living                          Prior Function            PT Goals (current goals can now be found in the care plan section) Acute Rehab PT Goals Patient Stated Goal: home PT Goal Formulation: With patient/family Time For Goal Achievement: 02/23/22 Potential to Achieve Goals: Good Progress towards PT goals: Progressing toward goals    Frequency    Min 3X/week      PT Plan Current plan remains appropriate    Co-evaluation              AM-PAC PT "6 Clicks" Mobility   Outcome Measure  Help needed turning from your back to your side while in a flat bed without using bedrails?: A Little Help needed moving from lying on your back to sitting on the side of a flat bed without using bedrails?: A Lot Help needed moving to and from a bed to a chair (including a wheelchair)?: A Lot Help needed standing up from a chair using your arms (e.g., wheelchair or bedside chair)?: A Lot Help needed to walk in hospital room?: A Lot Help needed climbing 3-5 steps with a railing? : Total 6 Click Score: 12    End of Session Equipment Utilized During Treatment: Oxygen Activity Tolerance: Patient limited by fatigue Patient left: in bed;with call bell/phone within reach;with family/visitor present;with nursing/sitter in room Nurse Communication: Mobility status PT Visit Diagnosis: Muscle weakness (generalized) (M62.81);Other abnormalities of gait and mobility (R26.89);Pain     Time:  9147-8295 PT Time Calculation (min) (ACUTE ONLY): 15 min  Charges:  $Therapeutic Activity: 8-22 mins                     Stacie Glaze, PT DPT Acute Rehabilitation Services Pager [phone]  Office [phone]    Roxine Caddy E Ruffin Pyo 02/10/2022, 2:06 PM

## 2022-02-10 NOTE — Progress Notes (Signed)
PROGRESS NOTE  [name]  DOB: 05/31/1951  PCP: Redmond School, MD VWU:[id number]  DOA: 02/05/2022  LOS: 4 days  Hospital Day: 6  Brief narrative:  [name] is a 71 y.o. 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 Patient has had Nissen's fundoplication complicated by Barrett's esophagus, gastroparesis.  He has chronic intermittent nausea and vomiting for which he is on Reglan twice a day. For the last few months, he has more pronounced nausea and vomiting.  He would gag after eating or drinking anything.  He has been slowly getting dehydrated.  Lost about 23 pounds in 3 weeks.  He was recently seen by GI Dr. Jenetta Downer and underwent EGD on 8/1.  He was found to have known short segment Barrett's esophagus, erosive gastropathy with stigmata of recent bleeding.  He continued to have symptoms after that as well.  Over the period of last 3 weeks, patient has become significantly dehydrated and weak.   About 2 weeks ago, patient tested positive for COVID.  He had mild symptoms only.  He was prescribed Paxlovid but his pharmacy did not have it so he could not take it.   Over the last several days, patient has had nightly fever as well.   8/28, he had a fall sustaining right-sided rib pain.  He was seen in the ED.  Imaging at that time was negative and hence patient was discharged home.  9/6, patient returned to the ED with complaint of worsening symptoms.  In the ED, patient was afebrile, heart rate 108, blood pressure 144/90.  Hemoccult positive with hemoglobin at 12.4 less than his baseline of 15. CT chest, abdomen pelvis showed new extensive patchy infiltrates are seen in both lungs suggesting  multifocal pneumonia, minimal bilateral pleural effusions. Ultrasound abdomen showed early acute changes, normal gallbladder, no gallstones.  CBD 4 mm size. Admitted to [facility]. GI consult obtained.  Subjective: Patient was seen and examined this morning.  Lying on bed.  On low-flow oxygen.  Complains of uncontrolled back pain.  Wife at bedside.   Able to tolerate regular diet which is much better compared to presentation. Wheezing audibly  Assessment/Plan: Principal Problem:   GI bleeding Active Problems:   COVID-19   Multifocal pneumonia  Intractable nausea, vomiting History of Nissen fundoplication complicated by Barrett's esophagus Patient was having a flareup of his chronic intermittent nausea vomiting.  He was significantly weak and dehydrated.  Adequate IV hydration given.  Symptoms controlled with IV Reglan, IV Protonix. GI consult was obtained.  Modified barium swallow without any significant pathology or aspiration risk.. For last 24 hours, he has been able to tolerate regular consistency diet.  Normocytic anemia on Xarelto Suspect upper GI bleeding Baseline hemoglobin over 15.  Presented with hemoglobin 12.4.   Xarelto was kept on hold for any possible GI intervention.  No intervention required.  It was resumed on 9/10. Continue PPI.  Hemoglobin stable. Recent Labs    05/05/21 2115 05/06/21 0406 06/19/21 0850 10/23/21 0952 01/27/22 1056 02/05/22 1023 02/06/22 0408 02/07/22 1509 02/08/22 0146 02/09/22 0233  HGB 14.0 13.9 15.5 15.6 15.7 12.4* 11.8* 11.4* 9.9* 10.5*   New onset A-fib Patient had a feeling of pounding of heart and chest pain intermittently.  9/9, EKG showed A-fib at a  rate of 102 bpm.  New onset A-fib.  PTA, he was already on Xarelto for DVT/PE.  Continue Xarelto Continue telemetry monitoring  Right-sided chest wall pain Because of fall last week at home.  Patient continues to have significant chest pain and limited mobility because of that.  He was  reluctant to try any pain medicines.  Agreed to try tramadol yesterday.  No significant pain control.  I switched him to Norco 10 mg 3 times daily as needed.  Switch Flexeril to twice daily.  Continue lidocaine patch.    Anxiety PRN Valium to continue as before.  History of mild CAD, ascending aortic aneurysm, HLD Troponin was mildly elevated this morning in the setting of A-fib but had chest pain as well. 9/9, echocardiogram showed EF 55 to 60%, did not show any wall motion abnormality. PTA on metoprolol, Ranexa, Xarelto, Crestor Continue metoprolol and Ranexa.  Continue both Crestor and Xarelto Recent Labs    02/08/22 0146 02/08/22 0803  TROPONINIHS 13 34*   Acute respiratory failure Multifocal pneumonia -suspect aspiration Recent COVID infection I suspect patient has chronic aspiration due to chronic abdominal nausea vomiting.  Wife reported nightly fever last few days. CT chest read as multifocal pneumonia Suspected aspiration pneumonia.  The radiology finding may be also partially secondary to recent COVID infection.   Procalcitonin level is not elevated however. Intermittently spikes low-grade fever.  WBC count normal. Initially started on IV Unasyn.  Subsequently switched to oral Augmentin.  Patient is wheezing today.  I will obtain chest x-ray.  Not on IV fluid currently. Recent Labs  Lab 02/05/22 1023 02/06/22 0408 02/07/22 0257 02/08/22 0146 02/09/22 0233  WBC 3.3* 4.4  --  5.5 3.8*  PROCALCITON  --  <0.10 <0.10  --   --    Essential hypertension PTA on metoprolol, lisinopril 10 mg daily Continue metoprolol.  Continue to hold lisinopril.  History of lymphoma, melanoma With oncologist at [facility].  Last chemotherapy was in 2022.  Recently followed-up. Currently in remission. PTA, patient seems to be on hydrocortisone 20 g in a.m. and 10 mg in p.m. Continue the same.  DVT/PE December 2022  PTA on Xarelto, I presume he is on it for lifelong in the setting of  malignancy. Currently on hold because of drop in hemoglobin.  Fatty liver Ultrasound abdomen showed early acute changes, normal gallbladder, no gallstones.  CBD 4 mm size. Mildly elevated LFTs.  Continue to trend. Recent Labs  Lab 02/05/22 1023 02/06/22 0408  AST 53* 44*  ALT 52* 48*  ALKPHOS 46 49  BILITOT 1.1 1.1  PROT 6.3* 6.1*  ALBUMIN 3.2* 3.2*   IBS-C PTA on Motegrity, MiraLAX, Metamucil  Mobility Encourage ambulation.  PT eval  Goals of care - -  Code Status: Full Code    Diet:  Diet Order             Diet regular Room service appropriate? Yes; Fluid consistency: Thin  Diet effective now                  DVT prophylaxis:  SCDs Start: 02/05/22 1729 rivaroxaban (XARELTO) tablet 20 mg   Antimicrobials: Oral Augmentin Fluid: Not on IV fluid Consultants: GI Family Communication: Wife at bedside  Status is: Inpatient  Continue in-hospital care because: Still looks tired, difficulty get out of bed.  Pain adequately controlled.  Not ready for discharge  Level of care: Telemetry Medical   Dispo: The patient is from: Home  Anticipated d/c is to: Pending clinical course              Patient currently is not medically stable to d/c.   Difficult to place patient No     Infusions:   methocarbamol (ROBAXIN) IV      Scheduled Meds:  albuterol  2.5 mg Nebulization BID   amoxicillin-clavulanate  1 tablet Oral Q12H   cyclobenzaprine  10 mg Oral BID   hydrocortisone  10 mg Oral QPM   hydrocortisone  20 mg Oral Daily   lidocaine  1 patch Transdermal Q24H   linaclotide  290 mcg Oral QAC breakfast   metoprolol succinate  25 mg Oral Daily   montelukast  10 mg Oral Daily   ondansetron (ZOFRAN) IV  4 mg Intravenous Q6H   pantoprazole (PROTONIX) IV  40 mg Intravenous Q12H   polyethylene glycol  17 g Oral BID   psyllium  1 packet Oral Daily   ranolazine  500 mg Oral BID   rivaroxaban  20 mg Oral Q supper   rosuvastatin  5 mg Oral QPM   senna  1  tablet Oral BID    PRN meds: acetaminophen **OR** acetaminophen, albuterol, bisacodyl, diazepam, hydrALAZINE, HYDROcodone-acetaminophen, methocarbamol (ROBAXIN) IV, prochlorperazine   Antimicrobials: Anti-infectives (From admission, onward)    Start     Dose/Rate Route Frequency Ordered Stop   02/09/22 2200  amoxicillin-clavulanate (AUGMENTIN) 875-125 MG per tablet 1 tablet        1 tablet Oral Every 12 hours 02/09/22 1714     02/06/22 0400  Ampicillin-Sulbactam (UNASYN) 3 g in sodium chloride 0.9 % 100 mL IVPB  Status:  Discontinued        3 g 200 mL/hr over 30 Minutes Intravenous Every 6 hours 02/05/22 2214 02/09/22 1714   02/05/22 1830  Ampicillin-Sulbactam (UNASYN) 3 g in sodium chloride 0.9 % 100 mL IVPB  Status:  Discontinued        3 g 200 mL/hr over 30 Minutes Intravenous Every 6 hours 02/05/22 1822 02/05/22 2214       Objective: Vitals:   02/10/22 0926 02/10/22 0941  BP: 109/82 106/76  Pulse: 81 71  Resp:    Temp: 97.7 F (36.5 C)   SpO2: 95%     Intake/Output Summary (Last 24 hours) at 02/10/2022 1147 Last data filed at 02/09/2022 1516 Gross per 24 hour  Intake 1024.11 ml  Output 550 ml  Net 474.11 ml   Filed Weights   02/05/22 0954 02/06/22 1600  Weight: 113 kg 112 kg   Weight change:  Body mass index is 33.49 kg/m.   Physical Exam: General exam: Pleasant, elderly Caucasian male.  Looks sad.  Intermittent episodes of back pain  Skin: No rashes, lesions or ulcers. HEENT: Atraumatic, normocephalic, no obvious bleeding Lungs: Diminished air entry in both bases.  No crackles or wheezing CVS: Regular rate and rhythm, no murmur GI/Abd soft, distended from obesity, nontender, bowel sound present CNS: Alert, awake, oriented x3 Psychiatry: Mood appropriate Extremities: No pedal edema, no calf tenderness  Data Review: I have personally reviewed the laboratory data and studies available.  F/u labs ordered Unresulted Labs (From admission, onward)    None        Signed, Terrilee Croak, MD [facility] 02/10/2022

## 2022-02-10 NOTE — Progress Notes (Signed)
Pt is confused at this time.  Will hold off on CPAP for now. Pt is on 2LNC, no distress noted.\

## 2022-02-10 NOTE — Progress Notes (Signed)
Consult received for PIV placement. Secure chat sent to Dr. Pietro Cassis and Marlowe Aschoff RN. Requesting they refer to the progress note written by Raynelle Fanning RN VAST. Fran Lowes, RN VAST

## 2022-02-10 NOTE — TOC Initial Note (Signed)
Transition of Care [facility]) - Initial/Assessment Note    Patient Details  Name: [name] MRN: [id number] Date of Birth: [birth year]  Transition of Care [facility]) CM/SW Contact:    Cyndi Bender, RN Phone Number: 02/10/2022, 1:29 PM  Clinical Narrative:                 Spoke to patient and wife at bedside. Patient very sleepy.  Wife states patient has walker, shower chair and BSC. Wife, Manuela Schwartz, states patient has used [facility] in the past and would like to use them again. Caryl Pina with [facility] accepted referral. Patient uses CPAP at night. Wife transports patient to appointments. TOC will continue to follow for needs.  Expected Discharge Plan: [place] Barriers to Discharge: Continued Medical Work up   Patient Goals and CMS Choice Patient states their goals for this hospitalization and ongoing recovery are:: return home CMS Medicare.gov Compare Post Acute Care list provided to:: Patient Represenative (must comment) Choice offered to / list presented to : Patient, Spouse  Expected Discharge Plan and Services Expected Discharge Plan: [place]   Discharge Planning Services: CM Consult Post Acute Care Choice: [place] arrangements for the past 2 months: Single Family Home                           HH Arranged: PT HH Agency: [place] ([place]) Date HH Agency Contacted: 02/10/22 Time [place]: 3300 Representative spoke with at [place]: Caryl Pina  Prior Living Arrangements/Services Living arrangements for the past 2 months: [place] Lives with:: Spouse Patient language and need for interpreter reviewed:: Yes Do you feel safe going back to the place where you live?: Yes      Need for Family Participation in Patient Care: Yes (Comment) Care giver support system in place?: Yes (comment) Current home services: DME (walker, shower chair, BSC) Criminal Activity/Legal Involvement Pertinent to  Current Situation/Hospitalization: No - Comment as needed  Activities of Daily Living      Permission Sought/Granted Permission sought to share information with : Case Manager       Permission granted to share info w AGENCY: HH        Emotional Assessment Appearance:: Appears stated age       Alcohol / Substance Use: Not Applicable Psych Involvement: No (comment)  Admission diagnosis:  GI bleeding [K92.2] Gastrointestinal hemorrhage, unspecified gastrointestinal hemorrhage type [K92.2] Patient Active Problem List   Diagnosis Date Noted   Multifocal pneumonia    COVID-19    GI bleeding 02/05/2022   IBS (irritable bowel syndrome) 11/18/2021   Acute pulmonary embolism ([place]) 05/05/2021   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   Fatty liver 08/14/2020   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   Constipation 05/24/2019   Pancreatic calcification 05/24/2019   Gastroparesis 01/04/2019   N&V (nausea and vomiting) 11/17/2018   Weakness 11/17/2018   Special screening for malignant neoplasms, colon 01/26/2018   Barrett's esophagus without dysplasia 01/26/2018   Thrombocytopenia ([place]) 04/02/2017   Lumbar stenosis with neurogenic claudication 11/03/2016   Intractable vomiting with nausea 05/29/2016   OSA (obstructive sleep apnea) 11/18/2013   Liver lesion 03/01/2013   Barrett's esophagus 03/01/2013   GERD (gastroesophageal reflux disease) 03/01/2013   Essential hypertension, benign 03/01/2013   S/P lumbar fusion 03/25/2012   Back pain without radiation 03/25/2012   PCP:  Redmond School, MD Pharmacy:   [facility] [phone] -  [place], [place] [address] [place] [address] Phone: [phone] Fax: [phone]  [place], [place] [address] Phone: [phone] Fax: [place], [place] - [place] [address] [place]  [address] Phone: [phone] Fax: [phone]     Social Determinants of Health (SDOH) Interventions    Readmission Risk Interventions     No data to display

## 2022-02-11 ENCOUNTER — Inpatient Hospital Stay (HOSPITAL_COMMUNITY): Payer: Medicare Other

## 2022-02-11 DIAGNOSIS — K922 Gastrointestinal hemorrhage, unspecified: Secondary | ICD-10-CM | POA: Diagnosis not present

## 2022-02-11 LAB — CBC
HCT: 32.7 % — ABNORMAL LOW (ref 39.0–52.0)
Hemoglobin: 10.8 g/dL — ABNORMAL LOW (ref 13.0–17.0)
MCH: 30.9 pg (ref 26.0–34.0)
MCHC: 33 g/dL (ref 30.0–36.0)
MCV: 93.7 fL (ref 80.0–100.0)
Platelets: 128 10*3/uL — ABNORMAL LOW (ref 150–400)
RBC: 3.49 MIL/uL — ABNORMAL LOW (ref 4.22–5.81)
RDW: 13.2 % (ref 11.5–15.5)
WBC: 4.3 10*3/uL (ref 4.0–10.5)
nRBC: 0 % (ref 0.0–0.2)

## 2022-02-11 LAB — BASIC METABOLIC PANEL
Anion gap: 8 (ref 5–15)
BUN: 22 mg/dL (ref 8–23)
CO2: 23 mmol/L (ref 22–32)
Calcium: 7.9 mg/dL — ABNORMAL LOW (ref 8.9–10.3)
Chloride: 108 mmol/L (ref 98–111)
Creatinine, Ser: 1.05 mg/dL (ref 0.61–1.24)
GFR, Estimated: >60 mL/min (ref >60)
Glucose, Bld: 80 mg/dL (ref 70–99)
Potassium: 4.1 mmol/L (ref 3.5–5.1)
Sodium: 139 mmol/L (ref 135–145)

## 2022-02-11 MED ORDER — SODIUM CHLORIDE 0.9 % IV SOLN: INTRAVENOUS | Status: DC

## 2022-02-11 MED ORDER — HYDROCODONE-ACETAMINOPHEN 5-325 MG PO TABS
1.0000 | ORAL_TABLET | Freq: Four times a day (QID) | ORAL | Status: DC | PRN
  Administered 2022-02-11 – 2022-02-13 (×3): 1 via ORAL
  Filled 2022-02-11 (×3): qty 1

## 2022-02-11 MED ORDER — PANTOPRAZOLE SODIUM 40 MG PO TBEC
40.0000 mg | DELAYED_RELEASE_TABLET | Freq: Two times a day (BID) | ORAL | Status: DC
  Administered 2022-02-12 – 2022-02-14 (×6): 40 mg via ORAL
  Filled 2022-02-11 (×6): qty 1

## 2022-02-11 MED ORDER — CYCLOBENZAPRINE HCL 5 MG PO TABS
10.0000 mg | ORAL_TABLET | Freq: Every day | ORAL | Status: DC
  Administered 2022-02-12: 10 mg via ORAL
  Filled 2022-02-11: qty 2

## 2022-02-11 MED ORDER — LIDOCAINE HCL 1 % IJ SOLN
INTRAMUSCULAR | Status: AC
  Filled 2022-02-11: qty 20

## 2022-02-11 MED ORDER — ONDANSETRON 4 MG PO TBDP: 4.0000 mg | ORAL_TABLET | Freq: Three times a day (TID) | ORAL | Status: DC | PRN

## 2022-02-11 MED ORDER — ONDANSETRON 4 MG PO TBDP
4.0000 mg | ORAL_TABLET | Freq: Three times a day (TID) | ORAL | Status: DC | PRN
  Administered 2022-02-12 (×2): 4 mg via ORAL
  Filled 2022-02-11 (×2): qty 1

## 2022-02-11 NOTE — Progress Notes (Signed)
PROGRESS NOTE  [name]  DOB: 1951-05-16  PCP: [employer]mond [employer], MD TDD:[id number]  DOA: 02/05/2022  LOS: 5 days  Hospital Day: 7  Brief narrative:  [name] is a 71 y.o. 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 Patient has had Nissen's fundoplication complicated by Barrett's esophagus, gastroparesis.  He has chronic intermittent nausea and vomiting for which he is on Reglan twice a day. For the last few months, he has more pronounced nausea and vomiting.  He would gag after eating or drinking anything.  He has been slowly getting dehydrated.  Lost about 23 pounds in 3 weeks.  He was recently seen by GI Dr. Jenetta Downer and underwent EGD on 8/1.  He was found to have known short segment Barrett's esophagus, erosive gastropathy with stigmata of recent bleeding.  He continued to have symptoms after that as well.  Over the period of last 3 weeks, patient has become significantly dehydrated and weak.   About 2 weeks ago, patient tested positive for COVID.  He had mild symptoms only.  He was prescribed Paxlovid but his pharmacy did not have it so he could not take it.   Over the last several days, patient has had nightly fever as well.   8/28, he had a fall sustaining right-sided rib pain.  He was seen in the ED.  Imaging at that time was negative and hence patient was discharged home.  9/6, patient returned to the ED with complaint of worsening symptoms.  In the ED, patient was afebrile, heart rate 108, blood pressure 144/90.  Hemoccult positive with hemoglobin at 12.4 less than his baseline of 15. CT chest, abdomen pelvis showed new extensive patchy infiltrates are seen in both lungs suggesting  multifocal pneumonia, minimal bilateral pleural effusions. Ultrasound abdomen showed early acute changes, normal gallbladder, no gallstones.  CBD 4 mm size. Admitted to [facility]. GI consult obtained.  Subjective: Patient was seen and examined this morning.  Lying down on bed.  More awake alert today.  Wife at bedside.  Daughter on the phone. Wife states that patient slept all day and all night yesterday and is much awake today.  Looks dehydrated and he has not been eating or drinking enough he wants to be up and walking.  Pending PT eval Daughter is concerned about dehydration, pain medicines  Assessment/Plan: Principal Problem:   GI bleeding Active Problems:   COVID-19   Multifocal pneumonia  Intractable nausea, vomiting History of Nissen fundoplication complicated by Barrett's esophagus Patient was having a flareup of his chronic intermittent nausea vomiting.  He was significantly weak and dehydrated.  Adequate IV hydration given.  Symptoms controlled with IV Reglan, IV Protonix. GI consult was obtained.  Modified barium swallow without any significant pathology or aspiration risk.. Patient's GI symptoms gradually improved.  Able to tolerate regular diet.  Encouraged oral hydration as well.  Multifocal pneumonia -suspect aspiration Reported nightly fever for few days prior to presentation.  Based on his history nausea and vomiting, aspiration pneumonia was suspected. Procalcitonin was negative but CT chest read as multifocal pneumonia Patient was started on IV Unasyn.  Subsequently switched to oral Augmentin. Recent Labs  Lab 02/05/22 1023 02/06/22 0408 02/07/22 0257 02/08/22 0146 02/09/22 0233 02/11/22 0820  WBC 3.3* 4.4  --  5.5 3.8* 4.3  PROCALCITON  --  <0.10 <0.10  --   --   --    Right-sided chest wall pain Because of fall last week at home.  Patient continues to have significant chest pain and limited mobility because of that.  He was reluctant to try any pain medicines.   Agreed to try tramadol yesterday.  No significant pain control.  I switched him to Norco 5 mg 3 times daily as needed.  Continue Flexeril at bedtime.  Continue lidocaine patch.  Acute respiratory failure Previously not on supplemental oxygen.  Macro no wheezing Patient is wheezing today.  I will obtain chest x-ray.  Not on IV fluid currently.  Dehydration Dry skin today.  IV fluid was stopped on 9/10 as his oral appetite improved.  I will start him on normal at 50 mill per hour for next 24 hours. Continue to monitor urine output  Normocytic anemia on Xarelto Suspect upper GI bleeding Baseline hemoglobin over 15.  Presented with hemoglobin 12.4.   Xarelto was kept on hold for any possible GI intervention.  No intervention required.  It was resumed on 9/10. Continue PPI.  Hemoglobin stable. Recent Labs    05/06/21 0406 06/19/21 0850 10/23/21 0952 01/27/22 1056 02/05/22 1023 02/06/22 0408 02/07/22 1509 02/08/22 0146 02/09/22 0233 02/11/22 0820  HGB 13.9 15.5 15.6 15.7 12.4* 11.8* 11.4* 9.9* 10.5* 10.8*   New onset A-fib 9/9, patient had a feeling of pounding of heart and chest pain intermittently. EKG showed A-fib at a rate of 102 bpm.  New onset A-fib.  He was started on metoprolol 25 mg daily. PTA, he was already on Xarelto for DVT/PE.  Continue Xarelto Continue telemetry monitoring  History of mild CAD, ascending aortic aneurysm, HLD Troponin was mildly elevated this morning in the setting of A-fib but had chest pain as well. 9/9, echocardiogram showed EF 55 to 60%, did not show any wall motion abnormality. PTA on metoprolol, Ranexa, Xarelto, Crestor Continue metoprolol and Ranexa.  Continue both Crestor and Xarelto  Essential hypertension PTA on metoprolol, lisinopril 10 mg daily Continue metoprolol.  Continue to hold lisinopril.  Anxiety PRN Valium to continue as before.  Chronic pain Percocet as needed  History of lymphoma, melanoma With oncologist at [facility].  Last  chemotherapy was in 2022.  Recently followed-up. Currently in remission. PTA, patient seems to be on hydrocortisone 20 g in a.m. and 10 mg in p.m. Continue the same.  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 Currently on hold because of drop in hemoglobin.  Fatty liver Ultrasound abdomen showed early acute changes, normal gallbladder, no gallstones.  CBD 4 mm size. Mildly elevated LFTs.  Continue to trend. Recent Labs  Lab 02/05/22 1023 02/06/22 0408  AST 53* 44*  ALT 52* 48*  ALKPHOS 46 49  BILITOT 1.1 1.1  PROT 6.3* 6.1*  ALBUMIN 3.2* 3.2*   IBS-C PTA on Motegrity, MiraLAX, Metamucil  Generalized weakness PT eval obtained.  Only able to to walk few feet.  Feeling better today. PT to see again today  Goals of care -  Code Status: Full Code    Diet:  Diet Order             Diet regular Room service appropriate? Yes; Fluid consistency: Thin  Diet effective now                  DVT prophylaxis:  SCDs  Start: 02/05/22 1729 rivaroxaban (XARELTO) tablet 20 mg   Antimicrobials: Oral Augmentin Fluid: NS at 50 ml/hr Consultants: GI Family Communication: Wife at bedside.  Daughter on the phone  Status is: Inpatient  Continue in-hospital care because: Still looks tired, difficulty get out of bed.  Pain adequately controlled.  Not ready for discharge  Level of care: Telemetry Medical   Dispo: The patient is from: Home              Anticipated d/c is to: Pending clinical course              Patient currently is not medically stable to d/c.   Difficult to place patient No     Infusions:   sodium chloride     methocarbamol (ROBAXIN) IV      Scheduled Meds:  albuterol  2.5 mg Nebulization BID   amoxicillin-clavulanate  1 tablet Oral Q12H   [START ON 02/12/2022] cyclobenzaprine  10 mg Oral QHS   hydrocortisone  10 mg Oral QPM   hydrocortisone  20 mg Oral Daily   lidocaine  1 patch Transdermal Q24H    lidocaine       linaclotide  290 mcg Oral QAC breakfast   metoprolol succinate  25 mg Oral Daily   montelukast  10 mg Oral Daily   ondansetron (ZOFRAN) IV  4 mg Intravenous Q6H   pantoprazole (PROTONIX) IV  40 mg Intravenous Q12H   polyethylene glycol  17 g Oral BID   psyllium  1 packet Oral Daily   ranolazine  500 mg Oral BID   rivaroxaban  20 mg Oral Q supper   rosuvastatin  5 mg Oral QPM   senna  1 tablet Oral BID    PRN meds: acetaminophen **OR** acetaminophen, albuterol, bisacodyl, diazepam, hydrALAZINE, HYDROcodone-acetaminophen, lidocaine, methocarbamol (ROBAXIN) IV, prochlorperazine   Antimicrobials: Anti-infectives (From admission, onward)    Start     Dose/Rate Route Frequency Ordered Stop   02/09/22 2200  amoxicillin-clavulanate (AUGMENTIN) 875-125 MG per tablet 1 tablet        1 tablet Oral Every 12 hours 02/09/22 1714     02/06/22 0400  Ampicillin-Sulbactam (UNASYN) 3 g in sodium chloride 0.9 % 100 mL IVPB  Status:  Discontinued        3 g 200 mL/hr over 30 Minutes Intravenous Every 6 hours 02/05/22 2214 02/09/22 1714   02/05/22 1830  Ampicillin-Sulbactam (UNASYN) 3 g in sodium chloride 0.9 % 100 mL IVPB  Status:  Discontinued        3 g 200 mL/hr over 30 Minutes Intravenous Every 6 hours 02/05/22 1822 02/05/22 2214       Objective: Vitals:   02/11/22 0400 02/11/22 0800  BP: 112/80 122/75  Pulse: 85 80  Resp: 14 14  Temp: 98.5 F (36.9 C) 97.6 F (36.4 C)  SpO2: 95% 98%    Intake/Output Summary (Last 24 hours) at 02/11/2022 1424 Last data filed at 02/11/2022 0400 Gross per 24 hour  Intake 240 ml  Output 430 ml  Net -190 ml   Filed Weights   02/05/22 0954 02/06/22 1600  Weight: 113 kg 112 kg   Weight change:  Body mass index is 33.49 kg/m.   Physical Exam: General exam: Pleasant, elderly Caucasian male.  Looks sad.  Intermittent episodes of back pain  Skin: No rashes, lesions or ulcers. HEENT: Atraumatic, normocephalic, no obvious  bleeding Lungs: Diminished air entry in both bases.  No crackles or wheezing CVS: Regular rate and rhythm, no  murmur GI/Abd soft, distended from obesity, nontender, bowel sound present CNS: More awake, alert, oriented x3. Psychiatry: Mood appropriate Extremities: No pedal edema, no calf tenderness  Data Review: I have personally reviewed the laboratory data and studies available.  F/u labs ordered Unresulted Labs (From admission, onward)    None       Signed, Terrilee Croak, MD [facility] 02/11/2022

## 2022-02-11 NOTE — Progress Notes (Signed)
Patient declined CPAP for the night. Patient wearing oxygen set at 3lpm with Sp02=95%.

## 2022-02-11 NOTE — Progress Notes (Signed)
Pt's wife called for RN to come into room d/t pt's AMS. Pt is stating that the RN is "trying to sell him pills and he wants to get back to his hospital room". Pt is in his hospital room. Pt's wife called daughter to try to calm pt down and daughter requested to speak to RN. Daughter is concerned that pt may have a UTI or is dehydrated. RN expressed daughter's concerns to MD. MD placed new orders. Pt's vital are stable and Per MD it is okay to leave IV out.

## 2022-02-12 DIAGNOSIS — R5381 Other malaise: Secondary | ICD-10-CM

## 2022-02-12 DIAGNOSIS — R112 Nausea with vomiting, unspecified: Secondary | ICD-10-CM

## 2022-02-12 DIAGNOSIS — J189 Pneumonia, unspecified organism: Secondary | ICD-10-CM

## 2022-02-12 DIAGNOSIS — I10 Essential (primary) hypertension: Secondary | ICD-10-CM

## 2022-02-12 DIAGNOSIS — I4891 Unspecified atrial fibrillation: Secondary | ICD-10-CM

## 2022-02-12 DIAGNOSIS — J9601 Acute respiratory failure with hypoxia: Secondary | ICD-10-CM

## 2022-02-12 DIAGNOSIS — K922 Gastrointestinal hemorrhage, unspecified: Secondary | ICD-10-CM | POA: Diagnosis not present

## 2022-02-12 DIAGNOSIS — K760 Fatty (change of) liver, not elsewhere classified: Secondary | ICD-10-CM

## 2022-02-12 DIAGNOSIS — K3184 Gastroparesis: Secondary | ICD-10-CM

## 2022-02-12 DIAGNOSIS — E869 Volume depletion, unspecified: Secondary | ICD-10-CM

## 2022-02-12 DIAGNOSIS — I480 Paroxysmal atrial fibrillation: Secondary | ICD-10-CM | POA: Diagnosis not present

## 2022-02-12 LAB — CBC WITH DIFFERENTIAL/PLATELET
Abs Immature Granulocytes: 0.16 10*3/uL — ABNORMAL HIGH (ref 0.00–0.07)
Basophils Absolute: 0 10*3/uL (ref 0.0–0.1)
Basophils Relative: 0 %
Eosinophils Absolute: 0 10*3/uL (ref 0.0–0.5)
Eosinophils Relative: 0 %
HCT: 32 % — ABNORMAL LOW (ref 39.0–52.0)
Hemoglobin: 10.8 g/dL — ABNORMAL LOW (ref 13.0–17.0)
Immature Granulocytes: 3 %
Lymphocytes Relative: 12 %
Lymphs Abs: 0.6 10*3/uL — ABNORMAL LOW (ref 0.7–4.0)
MCH: 30.9 pg (ref 26.0–34.0)
MCHC: 33.8 g/dL (ref 30.0–36.0)
MCV: 91.7 fL (ref 80.0–100.0)
Monocytes Absolute: 0.7 10*3/uL (ref 0.1–1.0)
Monocytes Relative: 13 %
Neutro Abs: 3.6 10*3/uL (ref 1.7–7.7)
Neutrophils Relative %: 72 %
Platelets: 129 10*3/uL — ABNORMAL LOW (ref 150–400)
RBC: 3.49 MIL/uL — ABNORMAL LOW (ref 4.22–5.81)
RDW: 13.1 % (ref 11.5–15.5)
WBC: 5 10*3/uL (ref 4.0–10.5)
nRBC: 0 % (ref 0.0–0.2)

## 2022-02-12 LAB — URINALYSIS, ROUTINE W REFLEX MICROSCOPIC
Bacteria, UA: NONE SEEN
Bilirubin Urine: NEGATIVE
Glucose, UA: NEGATIVE mg/dL
Hgb urine dipstick: NEGATIVE
Ketones, ur: 20 mg/dL — AB
Leukocytes,Ua: NEGATIVE
Nitrite: NEGATIVE
Protein, ur: 30 mg/dL — AB
Specific Gravity, Urine: 1.02 (ref 1.005–1.030)
pH: 5 (ref 5.0–8.0)

## 2022-02-12 LAB — BASIC METABOLIC PANEL
Anion gap: 8 (ref 5–15)
BUN: 19 mg/dL (ref 8–23)
CO2: 23 mmol/L (ref 22–32)
Calcium: 7.9 mg/dL — ABNORMAL LOW (ref 8.9–10.3)
Chloride: 103 mmol/L (ref 98–111)
Creatinine, Ser: 0.99 mg/dL (ref 0.61–1.24)
GFR, Estimated: >60 mL/min (ref >60)
Glucose, Bld: 101 mg/dL — ABNORMAL HIGH (ref 70–99)
Potassium: 3.6 mmol/L (ref 3.5–5.1)
Sodium: 134 mmol/L — ABNORMAL LOW (ref 135–145)

## 2022-02-12 MED ORDER — SODIUM CHLORIDE 0.9% FLUSH: 10.0000 mL | INTRAVENOUS | Status: DC | PRN

## 2022-02-12 MED ORDER — AMOXICILLIN-POT CLAVULANATE 875-125 MG PO TABS
1.0000 | ORAL_TABLET | Freq: Two times a day (BID) | ORAL | Status: AC
  Administered 2022-02-12: 1 via ORAL
  Filled 2022-02-12: qty 1

## 2022-02-12 MED ORDER — METOCLOPRAMIDE HCL 10 MG PO TABS
10.0000 mg | ORAL_TABLET | Freq: Two times a day (BID) | ORAL | Status: DC
  Administered 2022-02-12 – 2022-02-14 (×5): 10 mg via ORAL
  Filled 2022-02-12 (×5): qty 1

## 2022-02-12 MED ORDER — SODIUM CHLORIDE 0.9 % IV SOLN: INTRAVENOUS | Status: AC

## 2022-02-12 MED ORDER — SODIUM CHLORIDE 0.9% FLUSH
10.0000 mL | Freq: Two times a day (BID) | INTRAVENOUS | Status: DC
  Administered 2022-02-12 – 2022-02-14 (×5): 10 mL

## 2022-02-12 NOTE — Progress Notes (Signed)
Physical Therapy Treatment Patient Details Name: [name] MRN: [id number] DOB: 05/05/1951 Today's Date: 02/12/2022   History of Present Illness Pt is a 71 y.o. male admitted 9/6 with dx of GI bleed and PNA. Recent Covid+ in August. He presented to the ED with c/o N&V, and R rib pain following 8/28 fall at home. Xray negative for rib fxs. 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 Pt ambulatory in hallway with use of RW and min physical assist to steady, pt requiring MAX safety cues as pt running into multiple objects and seems unaware. Pt's daughter states pt is not at cognitive baseline, RN aware.   SpO2 when accurate 88-91% on RA, brief dip to 86% but quickly recovered to 88%.      Recommendations for follow up therapy are one component of a multi-disciplinary discharge planning process, led by the attending physician.  Recommendations may be updated based on patient status, additional functional criteria and insurance authorization.  Follow Up Recommendations  Home health PT     Assistance Recommended at Discharge Frequent or constant Supervision/Assistance  Patient can return home with the following Assistance with cooking/housework;Help with stairs or ramp for entrance;Assist for transportation;Direct supervision/assist for medications management;A little help with walking and/or transfers;A little help with bathing/dressing/bathroom   Equipment Recommendations  None recommended by PT    Recommendations for Other Services       Precautions / Restrictions  Precautions Precautions: Fall;Other (comment) Precaution Comments: watch vitals Restrictions Weight Bearing Restrictions: No     Mobility  Bed Mobility Overal bed mobility: Needs Assistance Bed Mobility: Supine to Sit, Sit to Supine     Supine to sit: Min assist Sit to supine: Mod assist   General bed mobility comments: assist for LE lifting into bed upon return to supine and truncal assist into and out of bed.    Transfers Overall transfer level: Needs assistance Equipment used: Rolling walker (2 wheels) Transfers: Sit to/from Stand Sit to Stand: Min assist           General transfer comment: assist for steadying, STS x3 from EOB.    Ambulation/Gait Ambulation/Gait assistance: Min assist Gait Distance (Feet): 100 Feet (+60) Assistive device: Rolling walker (2 wheels) Gait Pattern/deviations: Step-through pattern, Decreased stride length, Staggering left, Staggering right, Trunk flexed Gait velocity: decr     General Gait Details: assist to steady, correct pt running into several objects, maintain straight trajectory in hallway. MAX cuing for form, safety, and navigation in hallway but pt minimally listens to PT. SpO2 when accurate 88-91% on RA, brief dip to 86% but quickly recovered to 88.   Stairs             Wheelchair Mobility    Modified Rankin (Stroke Patients Only)       Balance Overall balance assessment: Needs assistance Sitting-balance support: No upper extremity supported, Feet supported Sitting balance-Leahy Scale: Fair     Standing balance support: During functional activity, Bilateral upper extremity supported, Reliant on assistive device for balance Standing balance-Leahy Scale: Poor  Cognition Arousal/Alertness: Awake/alert Behavior During Therapy: Impulsive Overall Cognitive Status: Impaired/Different from baseline Area of Impairment: Attention, Memory, Following commands,  Safety/judgement, Problem solving, Awareness, Orientation                 Orientation Level: Disoriented to, Place Current Attention Level: Sustained Memory: Decreased short-term memory Following Commands: Follows one step commands with increased time Safety/Judgement: Decreased awareness of safety Awareness: Emergent Problem Solving: Slow processing, Difficulty sequencing, Requires verbal cues General Comments: pt states he is in [place], when PT attempted to orient pt to location pt states "yeah, that's [place]". Pt with no safety awareness during mobility, hiting multiple obstacles with no attempt to address or correct. Pt with biting sense of humor and is irritable, per pt's daughter at bedside this is unlike pt.        Exercises      General Comments        Pertinent Vitals/Pain Pain Assessment Pain Assessment: Faces Faces Pain Scale: Hurts even more Pain Location: back, chronic Pain Descriptors / Indicators: Grimacing, Guarding, Discomfort, Moaning Pain Intervention(s): Limited activity within patient's tolerance, Monitored during session, Repositioned    Home Living                          Prior Function            PT Goals (current goals can now be found in the care plan section) Acute Rehab PT Goals Patient Stated Goal: home PT Goal Formulation: With patient/family Time For Goal Achievement: 02/23/22 Potential to Achieve Goals: Good Progress towards PT goals: Progressing toward goals    Frequency    Min 3X/week      PT Plan Current plan remains appropriate    Co-evaluation              AM-PAC PT "6 Clicks" Mobility   Outcome Measure  Help needed turning from your back to your side while in a flat bed without using bedrails?: A Little Help needed moving from lying on your back to sitting on the side of a flat bed without using bedrails?: A Little Help needed moving to and from a bed to a chair (including a  wheelchair)?: A Little Help needed standing up from a chair using your arms (e.g., wheelchair or bedside chair)?: A Little Help needed to walk in hospital room?: A Little Help needed climbing 3-5 steps with a railing? : A Lot 6 Click Score: 17    End of Session   Activity Tolerance: Patient limited by fatigue Patient left: in bed;with call bell/phone within reach;with family/visitor present;with nursing/sitter in room;with bed alarm set Nurse Communication: Mobility status PT Visit Diagnosis: Muscle weakness (generalized) (M62.81);Other abnormalities of gait and mobility (R26.89);Pain     Time: 1610-9604 PT Time Calculation (min) (ACUTE ONLY): 34 min  Charges:  $Gait Training: 8-22 mins $Therapeutic Activity: 8-22 mins                     Stacie Glaze, PT DPT Acute Rehabilitation Services Pager [phone]  Office [phone]   [place] 02/12/2022, 5:07 PM

## 2022-02-12 NOTE — Progress Notes (Signed)
PROGRESS NOTE    [name]  ZOX:[id number] DOB: [birth year] DOA: 02/05/2022 PCP: Redmond School, MD    Brief Narrative:  [name] is a 71 y.o. male with past medical history of  Nissen's fundoplication complicated by Barrett's esophagus, gastroparesis, chronic intermittent nausea/vomiting, hypertension, hyperlipidemia, CAD, ascending aortic aneurysm, splenic lymphoma, DVT/PE on Xarelto since 05/2021, fatty liver, anxiety, multiple abdominal surgeries in the past presented to hospital with increasing nausea and vomiting for the last few months with weight loss of 23 pounds.  Patient was recently seen by GI Dr. Jenetta Downer and underwent EGD on 12/31/21.  Patient was noted to have short segment Barrett's esophagus erosive gastritis 50 and stigmata of recent bleeding but he did continue to have symptoms after that and was significantly dehydrated.  2 weeks prior to presentation he also tested positive for COVID and was prescribed Paxil but but he could not take it.  Several days prior to this presentation patient had fever and had a fall with right-sided chest pain.  Patient then presented to the hospital.  In the ED patient was mildly tachycardic.  Hemoccult was positive but his hemoglobin was 12.4.  CT scan of the chest abdomen pelvis showed extensive patchy infiltrate suggestive of multifocal pneumonia with minimal pleural effusion.  Patient was then considered for admission to the hospital..  Assessment and plan  Principal Problem:   Intractable vomiting with nausea Active Problems:   Essential hypertension, benign   Debility   Gastroparesis   Fatty liver   GI bleeding   Multifocal pneumonia   Volume depletion   Acute respiratory failure with hypoxia (HCC)   A-fib (HCC)   Intractable nausea, vomiting History of Nissen fundoplication complicated by Barrett's esophagus Nausea and vomiting has improved.  Patient did have a flareup of his chronic intermittent nausea and vomiting and was  fairly dehydrated.  Patient does have history of gastroparesis and is on Reglan as outpatient.  Received IV fluids and Reglan Protonix while in the hospital.  Resume home Reglan.  Continue PPI.  GI saw the patient and modified barium swallow was performed without any risk for aspiration.  GI symptoms subsequently improved and patient has been able to tolerate regular diet at this time.  Continues to have nausea.   Multifocal pneumonia -suspect aspiration Due to his history of nausea and vomiting.  CT chest with multifocal infiltrates.  Patient was on IV Unasyn which was changed to oral Augmentin at this time.  Procalcitonin has been negative.  Temperature max of 100.5.  Fahrenheit and has completed 7-day course of antibiotic.  We will monitor off antibiotic at this time.  Right-sided chest wall pain Status post fall.  Reluctant to try pain medication and participate with physical therapy but subsequently has been initiated on Norco, Flexeril and Lidoderm patch.  Incentive spirometry and deep breathing.  Acute respiratory failure Not on oxygen at home.  Currently on 3 L of oxygen likely secondary to pneumonia.    Volume depletion Will insert midline since did not have IV access.  Oral cavity very dry with diminished urinary output.  We will continue normal saline 100 mill per hour for 1 day and then reassess..  Normocytic anemia on Xarelto Suspect upper GI bleeding  Presented with hemoglobin 12.4.  Xarelto was kept on hold for any possible GI intervention but resumed on 02/09/22 since hemoglobin was stable.  Continue PPI.Marland Kitchen    New onset A-fib Diagnosed on 02/08/2022.  Has been started on metoprolol.  Already on  Xarelto.  Rate controlled at this time.  History of mild CAD, ascending aortic aneurysm, hyperlipidemia. Troponin was slightly elevated in the context of A-fib.  No chest pain. 2D echocardiogram on 9/ 9/23 showed  EF 55 to 60%, did not show any wall motion abnormality.  Patient is on  metoprolol, Ranexa Xarelto and Crestor at home.  This has been continued while in the hospital.   Essential hypertension Patient is on metoprolol, lisinopril at home.  Lisinopril on hold.  Continue metoprolol.   Anxiety On as needed Valium.  Chronic pain Percocet as needed   History of lymphoma, melanoma Patient follows up with oncology at  at [facility].  Last chemotherapy was in 2022.  Currently in remission as per the patient and had a recent follow-up. Continue hydrocortisone 20 g in a.m. and 10 mg in p.m from home.  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   Fatty liver Ultrasound showing  early fatty changes.  Mild elevated LFT.  Irritable bowel syndrome constipation predominant.  Continue Senokot Metamucil Linzess   Generalized weakness Patient was reluctant to participate with physical therapy but is willing to continue.  PT has recommended home health PT.    DVT prophylaxis: SCDs Start: 02/05/22 1729 rivaroxaban (XARELTO) tablet 20 mg   Code Status:     Code Status: Full Code  Disposition: Home with home health PT in 1 to 2 days.  Status is: Inpatient Remains inpatient appropriate because: Fever, requiring supplemental oxygen, volume depletion requiring IV fluids,   Family Communication: Spoke with the patient's wife bedside and daughter on the phone and updated about the clinical condition of the patient.  Consultants:  GI  Procedures:  None  Antimicrobials:  Augmentin.    Anti-infectives (From admission, onward)    Start     Dose/Rate Route Frequency Ordered Stop   02/09/22 2200  amoxicillin-clavulanate (AUGMENTIN) 875-125 MG per tablet 1 tablet        1 tablet Oral Every 12 hours 02/09/22 1714     02/06/22 0400  Ampicillin-Sulbactam (UNASYN) 3 g in sodium chloride 0.9 % 100 mL IVPB  Status:  Discontinued        3 g 200 mL/hr over 30 Minutes Intravenous Every 6 hours 02/05/22 2214 02/09/22 1714   02/05/22 1830   Ampicillin-Sulbactam (UNASYN) 3 g in sodium chloride 0.9 % 100 mL IVPB  Status:  Discontinued        3 g 200 mL/hr over 30 Minutes Intravenous Every 6 hours 02/05/22 1822 02/05/22 2214      Subjective: Today, patient was seen and examined at bedside.  Patient's family at bedside.  Patient stated that he feels terrible has nausea and poor oral intake.  No vomiting though.  Was able to tolerate regular diet.  Has some shortness of breath with clear phlegm production.  Noted mild grade fever.  Has diminished urinary output.  Objective: Vitals:   02/11/22 2309 02/12/22 0013 02/12/22 0348 02/12/22 1026  BP:  108/64 124/70 133/78  Pulse: 77 81 77 (!) 103  Resp: (!) '22 17 19   '$ Temp:  (!) 97.3 F (36.3 C) 98 F (36.7 C) 99.4 F (37.4 C)  TempSrc:  Oral Oral Oral  SpO2: 91% 94% 91% 94%  Weight:      Height:        Intake/Output Summary (Last 24 hours) at 02/12/2022 1236 Last data filed at 02/12/2022 1000 Gross per 24 hour  Intake --  Output 876 ml  Net -876 ml   Filed Weights   02/05/22 0954 02/06/22 1600  Weight: 113 kg 112 kg    Physical Examination: Body mass index is 33.49 kg/m.   General: Obese built, not in obvious distress, on nasal cannula oxygen, appears weak and deconditioned, HENT:   No scleral pallor or icterus noted. Oral mucosa is dry Chest:  .  Diminished breath sounds bilaterally.  Coarse breath sounds noted CVS: S1 &S2 heard. No murmur.  Regular rate and rhythm. Abdomen: Soft, nontender, obese abdomen bowel sounds are heard.   Extremities: No cyanosis, clubbing or edema.  Peripheral pulses are palpable. Psych: Alert, awake and oriented, flat affect CNS:  No cranial nerve deficits.  Generalized weakness noted Skin: Warm and dry.  Skin turgor diminished  Data Reviewed:   CBC: Recent Labs  Lab 02/06/22 0408 02/07/22 1509 02/08/22 0146 02/09/22 0233 02/11/22 0820 02/12/22 0310  WBC 4.4  --  5.5 3.8* 4.3 5.0  NEUTROABS  --   --  4.4 2.7  --  3.6  HGB  11.8* 11.4* 9.9* 10.5* 10.8* 10.8*  HCT 34.4* 33.4* 28.8* 31.4* 32.7* 32.0*  MCV 93.2  --  92.9 92.9 93.7 91.7  PLT 124*  --  103* 113* 128* 129*    Basic Metabolic Panel: Recent Labs  Lab 02/06/22 0408 02/08/22 0146 02/09/22 0233 02/11/22 0820 02/12/22 0310  NA 140 140 137 139 134*  K 3.3* 3.3* 4.2 4.1 3.6  CL 106 110 108 108 103  CO2 '24 22 22 23 23  '$ GLUCOSE 87 86 110* 80 101*  BUN '12 10 13 22 19  '$ CREATININE 1.22 0.95 1.13 1.05 0.99  CALCIUM 8.5* 7.0* 7.7* 7.9* 7.9*  MG  --  1.2*  --   --   --   PHOS  --  1.4*  --   --   --     Liver Function Tests: Recent Labs  Lab 02/06/22 0408  AST 44*  ALT 48*  ALKPHOS 49  BILITOT 1.1  PROT 6.1*  ALBUMIN 3.2*     Radiology Studies: CT HEAD WO CONTRAST (5MM)  Result Date: 02/11/2022 CLINICAL DATA:  Altered mental status. EXAM: CT HEAD WITHOUT CONTRAST TECHNIQUE: Contiguous axial images were obtained from the base of the skull through the vertex without intravenous contrast. RADIATION DOSE REDUCTION: This exam was performed according to the departmental dose-optimization program which includes automated exposure control, adjustment of the mA and/or kV according to patient size and/or use of iterative reconstruction technique. COMPARISON:  March 06, 2015 FINDINGS: Brain: There is mild cerebral atrophy with widening of the extra-axial spaces and ventricular dilatation. There are areas of decreased attenuation within the white matter tracts of the supratentorial brain, consistent with microvascular disease changes. Vascular: No hyperdense vessel or unexpected calcification. Skull: Normal. Negative for fracture or focal lesion. Sinuses/Orbits: No acute finding. Other: None. IMPRESSION: Generalized cerebral atrophy and chronic white matter small vessel ischemic changes without evidence of an acute intracranial abnormality. Electronically Signed   By: Virgina Norfolk M.D.   On: 02/11/2022 20:08   IR US CHEST  Result Date:  02/11/2022 CLINICAL DATA:  Possible pleural effusion EXAM: CHEST ULTRASOUND COMPARISON:  Chest x-ray 02/10/2022 FINDINGS: Sonographic evaluation of the abdomen demonstrates no evidence of ascites or pleural effusion. Sonographic evaluation of the chest demonstrates no evidence of pleural effusion. IMPRESSION: Negative for pleural effusion and ascites. Electronically Signed   By: Jacqulynn Cadet M.D.   On: 02/11/2022 15:49      LOS: 6  days    Flora Lipps, MD [facility] Available via Epic secure chat 7am-7pm After these hours, please refer to coverage provider listed on [email or web address] 02/12/2022, 12:36 PM

## 2022-02-12 NOTE — Hospital Course (Addendum)
[name] is a 71 y.o. male with past medical history of  Nissen's fundoplication complicated by Barrett's esophagus, gastroparesis, chronic intermittent nausea/vomiting, hypertension, hyperlipidemia, CAD, ascending aortic aneurysm, splenic lymphoma, DVT/PE on Xarelto since 05/2021, fatty liver, anxiety, multiple abdominal surgeries in the past presented to hospital with increasing nausea and vomiting for the last few months with weight loss of 23 pounds.  Patient was recently seen by GI Dr. Jenetta Downer and underwent EGD on 12/31/21.  Patient was noted to have short segment Barrett's esophagus erosive gastritis 50 and stigmata of recent bleeding but he did continue to have symptoms after that and was significantly dehydrated.  2 weeks prior to presentation he also tested positive for COVID and was prescribed Paxil but but he could not take it.  Several days prior to this presentation patient had fever and had a fall with right-sided chest pain.  Patient then presented to the hospital.  In the ED patient was mildly tachycardic.  Hemoccult was positive but his hemoglobin was 12.4.  CT scan of the chest abdomen pelvis showed extensive patchy infiltrate suggestive of multifocal pneumonia with minimal pleural effusion.  Patient was then considered for admission to the hospital..

## 2022-02-13 DIAGNOSIS — R112 Nausea with vomiting, unspecified: Secondary | ICD-10-CM | POA: Diagnosis not present

## 2022-02-13 LAB — BASIC METABOLIC PANEL
Anion gap: 8 (ref 5–15)
BUN: 14 mg/dL (ref 8–23)
CO2: 24 mmol/L (ref 22–32)
Calcium: 7.9 mg/dL — ABNORMAL LOW (ref 8.9–10.3)
Chloride: 108 mmol/L (ref 98–111)
Creatinine, Ser: 0.95 mg/dL (ref 0.61–1.24)
GFR, Estimated: >60 mL/min (ref >60)
Glucose, Bld: 96 mg/dL (ref 70–99)
Potassium: 3.5 mmol/L (ref 3.5–5.1)
Sodium: 140 mmol/L (ref 135–145)

## 2022-02-13 LAB — MAGNESIUM: Magnesium: 2 mg/dL (ref 1.7–2.4)

## 2022-02-13 LAB — CBC
HCT: 28.3 % — ABNORMAL LOW (ref 39.0–52.0)
Hemoglobin: 9.1 g/dL — ABNORMAL LOW (ref 13.0–17.0)
MCH: 30.4 pg (ref 26.0–34.0)
MCHC: 32.2 g/dL (ref 30.0–36.0)
MCV: 94.6 fL (ref 80.0–100.0)
Platelets: 112 10*3/uL — ABNORMAL LOW (ref 150–400)
RBC: 2.99 MIL/uL — ABNORMAL LOW (ref 4.22–5.81)
RDW: 13.1 % (ref 11.5–15.5)
WBC: 5.4 10*3/uL (ref 4.0–10.5)
nRBC: 0 % (ref 0.0–0.2)

## 2022-02-13 NOTE — Progress Notes (Signed)
RT asked pt if he needed help with cpap, pt declined.

## 2022-02-13 NOTE — Progress Notes (Signed)
Mobility Specialist Progress Note:   02/13/22 1035  Mobility  Activity Transferred to/from [facility]  Level of Assistance Moderate assist, patient does 50-74%  Assistive Device Front wheel walker  Distance Ambulated (ft) 5 ft  Activity Response Tolerated well  $Mobility charge 1 Mobility   Pt requesting to transfer to [facility] d/t bed soaked in urine. Required modA for bed mobility, minA for simple transfers. Linens changed, pt left sitting EOB. RN and NT in room for bath.   Nelta Numbers Acute Rehab Secure Chat or Office Phone: [phone]

## 2022-02-13 NOTE — Progress Notes (Addendum)
PROGRESS NOTE    [name]  BJY:[id number] DOB: [birth year] DOA: 02/05/2022 PCP: Redmond School, MD    Brief Narrative:  [name] is a 71 y.o. 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  Patient was recently seen by GI Dr. Jenetta Cochran and underwent EGD on 12/31/21.  Patient was noted to have short segment Barrett's esophagus erosive gastritis and stigmata of recent bleeding but he did continue to have symptoms and was significantly dehydrated.   2 weeks prior to presentation he also tested positive for COVID and was prescribed Paxil but but he could not take it.  Several days prior to this presentation patient had fever and had a fall with right-sided chest pain.  Patient then presented to the hospital.    In the ED, patient was mildly tachycardic.  Hemoccult was positive but his hemoglobin was 12.4.  CT scan of the chest abdomen pelvis showed extensive patchy infiltrate suggestive of multifocal pneumonia with minimal pleural effusion.  Patient was then considered for admission to the hospital.  Completed antibiotic therapy.  Hospital course complicated by generalized weakness and delirium.  He was assessed by PT OT with recommendation for home health PT OT.  02/13/2022: The patient was seen and examined at his bedside.  Has.  Of confusions.  States he feels weak.  Per his daughter at bedside simply not himself.  Assessment and plan  Principal Problem:   Intractable vomiting with nausea Active Problems:   Essential hypertension, benign   Debility    Gastroparesis   Fatty liver   GI bleeding   Multifocal pneumonia   Volume depletion   Acute respiratory failure with hypoxia (HCC)   A-fib (HCC)   Improved, intractable nausea, vomiting History of Nissen fundoplication complicated by Barrett's esophagus Nausea and vomiting has improved.  Patient did have a flareup of his chronic intermittent nausea and vomiting and was fairly dehydrated.  Patient does have history of gastroparesis and is on Reglan as outpatient.  Received IV fluids and Reglan Protonix while in the hospital.  Resume home Reglan.  Continue PPI.  GI saw the patient and modified barium swallow was performed.  GI symptoms subsequently improved and patient has been able to tolerate regular diet at this time.  Continue supportive care as needed.   Multifocal pneumonia -suspect aspiration Due to his history of nausea and vomiting.  CT chest with multifocal infiltrates.  Patient was on IV Unasyn which was changed to oral Augmentin.   The patient completed a 7-day course of antibiotics.  Afebrile with no leukocytosis  Acute metabolic encephalopathy, delirium CT head done on 02/11/2022 was non acute Delirium in the setting of acute illness Avoid sedative agents Regulate sleep and wake cycle. Reorient as needed Fall precautions Continue to mobilize as tolerated  Right-sided chest wall pain, improving. Wean off muscle relaxant Analgesics as needed Continue incentive spirometer Continue to mobilize as tolerated.  Acute hypoxic respiratory failure, improved Currently on room air with O2 saturation of 92% at rest. Obtain home O2 evaluation. Maintain O2 saturation greater than 92%.  Volume depletion, resolved 02/12/2022 midline since did not have IV access.   Oral cavity was very dry with diminished urinary  output.   He received normal saline 100 mill per hour for 1 day yesterday with plan to reassess. Currently euvolemic on exam.  Normocytic anemia on Xarelto Suspect upper  GI bleeding  Presented with hemoglobin 12.4.  Xarelto was kept on hold for any possible GI intervention but resumed on 02/09/22 since hemoglobin was stable.   Continue PPI p.o. twice daily   New onset A-fib Diagnosed on 02/08/2022.  Has been started on metoprolol.  Already on Xarelto.  Rate controlled at this time.  History of mild CAD, ascending aortic aneurysm, hyperlipidemia. Troponin was slightly elevated in the context of A-fib.  No chest pain. 2D echocardiogram on 02/08/22 showed  EF 55 to 60%, did not show any wall motion abnormality.  Patient is on metoprolol, Ranexa, Xarelto and Crestor at home.  Continue home cardiac medications.   Essential hypertension BP is at goal 122/69. Patient is on metoprolol, lisinopril at home.  Home lisinopril on hold to avoid hypotension. Continue to monitor vital signs.   Chronic anxiety On as needed Valium.  Chronic pain Percocet as needed   History of lymphoma, melanoma Patient follows up with oncology at [facility].  Last chemotherapy was in 2022.  Currently in remission as per the patient and had a recent follow-up. Continue hydrocortisone 20 g in a.m. and 10 mg in p.m from home.  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   Fatty liver Ultrasound showing  early fatty changes.  Mild elevated LFT.  Irritable bowel syndrome constipation predominant.  Continue Senokot Metamucil Linzess   Generalized weakness Patient was reluctant to participate with physical therapy but is willing to continue.  PT has recommended home health PT. Continue therapies Continue fall precautions    DVT prophylaxis: SCDs Start: 02/05/22 1729 rivaroxaban (XARELTO) tablet 20 mg   Code Status:     Code Status: Full Code  Disposition: Home with home health PT in 1 to 2 days.  Status is: Inpatient Remains inpatient appropriate because: Fever, requiring supplemental oxygen, volume depletion requiring IV fluids,   Family Communication:  Updated the patient's daughter at bedside.  Consultants:  GI  Procedures:  None  Antimicrobials:  Unasyn Augmentin.    Anti-infectives (From admission, onward)    Start     Dose/Rate Route Frequency Ordered Stop   02/12/22 2200  amoxicillin-clavulanate (AUGMENTIN) 875-125 MG per tablet 1 tablet        1 tablet Oral Every 12 hours 02/12/22 1256 02/12/22 2220   02/09/22 2200  amoxicillin-clavulanate (AUGMENTIN) 875-125 MG per tablet 1 tablet  Status:  Discontinued        1 tablet Oral Every 12 hours 02/09/22 1714 02/12/22 1256   02/06/22 0400  Ampicillin-Sulbactam (UNASYN) 3 g in sodium chloride 0.9 % 100 mL IVPB  Status:  Discontinued        3 g 200 mL/hr over 30 Minutes Intravenous Every 6 hours 02/05/22 2214 02/09/22 1714   02/05/22 1830  Ampicillin-Sulbactam (UNASYN) 3 g in sodium chloride 0.9 % 100 mL IVPB  Status:  Discontinued        3 g 200 mL/hr over 30 Minutes Intravenous Every 6 hours 02/05/22 1822 02/05/22 2214        Objective: Vitals:   02/12/22 2339 02/13/22 0258 02/13/22 0326 02/13/22 0844  BP: (!) 145/77  128/86 122/69  Pulse: 80  84 70  Resp: '19  20 16  '$ Temp: 98 F (36.7 C) 99.5 F (37.5 C) 99 F (37.2 C)  TempSrc: Oral Oral Oral   SpO2: 92%  92% 92%  Weight:      Height:        Intake/Output Summary (Last 24 hours) at 02/13/2022 1110 Last data filed at 02/13/2022 0600 Gross per 24 hour  Intake 106.94 ml  Output 1350 ml  Net -1243.06 ml   Filed Weights   02/05/22 0954 02/06/22 1600  Weight: 113 kg 112 kg    Physical Examination: Body mass index is 33.49 kg/m.   General: Obese in no acute stress.  Alert and minimally interactive. Cardiac: Regular rate and rhythm no rubs or gallops. Pulmonary: Clear to auscultation no wheeze or rales.  Poor inspiratory effort. Abdomen: Obese nontender bowel sounds present. Extremities: Trace lower extremity edema bilaterally. Psych: Flat affect.   Data Reviewed:   CBC: Recent Labs  Lab  02/08/22 0146 02/09/22 0233 02/11/22 0820 02/12/22 0310 02/13/22 0343  WBC 5.5 3.8* 4.3 5.0 5.4  NEUTROABS 4.4 2.7  --  3.6  --   HGB 9.9* 10.5* 10.8* 10.8* 9.1*  HCT 28.8* 31.4* 32.7* 32.0* 28.3*  MCV 92.9 92.9 93.7 91.7 94.6  PLT 103* 113* 128* 129* 112*    Basic Metabolic Panel: Recent Labs  Lab 02/08/22 0146 02/09/22 0233 02/11/22 0820 02/12/22 0310 02/13/22 0343  NA 140 137 139 134* 140  K 3.3* 4.2 4.1 3.6 3.5  CL 110 108 108 103 108  CO2 '22 22 23 23 24  '$ GLUCOSE 86 110* 80 101* 96  BUN '10 13 22 19 14  '$ CREATININE 0.95 1.13 1.05 0.99 0.95  CALCIUM 7.0* 7.7* 7.9* 7.9* 7.9*  MG 1.2*  --   --   --  2.0  PHOS 1.4*  --   --   --   --     Liver Function Tests: No results for input(s): "AST", "ALT", "ALKPHOS", "BILITOT", "PROT", "ALBUMIN" in the last 168 hours.    Radiology Studies: CT HEAD WO CONTRAST (5MM)  Result Date: 02/11/2022 CLINICAL DATA:  Altered mental status. EXAM: CT HEAD WITHOUT CONTRAST TECHNIQUE: Contiguous axial images were obtained from the base of the skull through the vertex without intravenous contrast. RADIATION DOSE REDUCTION: This exam was performed according to the departmental dose-optimization program which includes automated exposure control, adjustment of the mA and/or kV according to patient size and/or use of iterative reconstruction technique. COMPARISON:  March 06, 2015 FINDINGS: Brain: There is mild cerebral atrophy with widening of the extra-axial spaces and ventricular dilatation. There are areas of decreased attenuation within the white matter tracts of the supratentorial brain, consistent with microvascular disease changes. Vascular: No hyperdense vessel or unexpected calcification. Skull: Normal. Negative for fracture or focal lesion. Sinuses/Orbits: No acute finding. Other: None. IMPRESSION: Generalized cerebral atrophy and chronic white matter small vessel ischemic changes without evidence of an acute intracranial abnormality.  Electronically Signed   By: Virgina Norfolk M.D.   On: 02/11/2022 20:08   IR US CHEST  Result Date: 02/11/2022 CLINICAL DATA:  Possible pleural effusion EXAM: CHEST ULTRASOUND COMPARISON:  Chest x-ray 02/10/2022 FINDINGS: Sonographic evaluation of the abdomen demonstrates no evidence of ascites or pleural effusion. Sonographic evaluation of the chest demonstrates no evidence of pleural effusion. IMPRESSION: Negative for pleural effusion and ascites. Electronically Signed   By: Jacqulynn Cadet M.D.   On: 02/11/2022 15:49      LOS: 7 days    Kayleen Memos, MD [facility] Available via Epic secure chat 7am-7pm After these hours, please refer to coverage provider listed on  [email or web address] 02/13/2022, 11:10 AM

## 2022-02-13 NOTE — Progress Notes (Signed)
Physical Therapy Treatment Patient Details Name: [name] MRN: [id number] DOB: December 11, 1950 Today's Date: 02/13/2022   History of Present Illness Pt is a 71 y.o. male admitted 9/6 with dx of GI bleed and PNA. Recent Covid+ in August. He presented to the ED with c/o N&V, and R rib pain following 8/28 fall at home. Xray negative for rib fxs. PMH:  Nissen's fundoplication complicated by Barrett's esophagus, gastroparesis, chronic intermittent nausea/vomiting HTN, HLD, mild CAD, ascending aortic aneurysm, splenic lymphoma, DVT/PE on Xarelto since 05/2021, fatty liver, anxiety, multiple abdominal surgeries, multi back sx, chronic back pain    PT Comments    pt sleeping upon arrival to room satting at 70% on RA, placed on 4LO2 and woke pt, cues for deep breathing and pt recovered sats within 1.5 minutes, RN aware of incident and pt with history of OSA on CPAP. Pt ambulatory in hallway with use of RW and RA, maintained SpO2 91% and greater when ambulating. PT educated pt and family on need for cpap when sleeping, pt and family express understanding. Pt requiring more physical assist for mobility today, per pt's wife she can manage assisting him at home. Will continue to follow.      Recommendations for follow up therapy are one component of a multi-disciplinary discharge planning process, led by the attending physician.  Recommendations may be updated based on patient status, additional functional criteria and insurance authorization.  Follow Up Recommendations  Home health PT     Assistance Recommended at Discharge Frequent or constant Supervision/Assistance  Patient can return home with the following Assistance with cooking/housework;Help with stairs or ramp for entrance;Assist for transportation;Direct supervision/assist for medications management;A little help with walking and/or transfers;A little help with bathing/dressing/bathroom   Equipment Recommendations  None recommended by PT     Recommendations for Other Services       Precautions / Restrictions Precautions Precautions: Fall;Other (comment) Precaution Comments: watch vitals Restrictions Weight Bearing Restrictions: No     Mobility  Bed Mobility Overal bed mobility: Needs Assistance Bed Mobility: Supine to Sit, Sit to Supine     Supine to sit: Mod assist, +2 for physical assistance Sit to supine: Min assist, HOB elevated   General bed mobility comments: assist for LE lift, trunk elevation from supine, and scooting to EOB.    Transfers Overall transfer level: Needs assistance Equipment used: Rolling walker (2 wheels) Transfers: Sit to/from Stand Sit to Stand: Mod assist           General transfer comment: assist to power up, rise, steady    Ambulation/Gait Ambulation/Gait assistance: Min assist Gait Distance (Feet): 75 Feet Assistive device: Rolling walker (2 wheels) Gait Pattern/deviations: Step-through pattern, Decreased stride length, Staggering left, Staggering right, Trunk flexed Gait velocity: decr     General Gait Details: assist to steady, cues for upright posture, placeemtn in RW, and watching out for lines/leads. sPO2 91% and greater on RA during gait with SpO2 monitor on ear.   Stairs             Wheelchair Mobility    Modified Rankin (Stroke Patients Only)       Balance Overall balance assessment: Needs assistance, History of Falls Sitting-balance support: No upper extremity supported, Feet supported Sitting balance-Leahy Scale: Fair     Standing balance support: During functional activity, Bilateral upper extremity supported, Reliant on assistive device for balance Standing balance-Leahy Scale: Poor  Cognition Arousal/Alertness: Lethargic Behavior During Therapy: Flat affect Overall Cognitive Status: Impaired/Different from baseline Area of Impairment: Attention, Memory, Following commands, Safety/judgement,  Problem solving, Awareness                   Current Attention Level: Sustained Memory: Decreased short-term memory Following Commands: Follows one step commands with increased time Safety/Judgement: Decreased awareness of safety Awareness: Emergent Problem Solving: Slow processing, Difficulty sequencing, Requires verbal cues General Comments: pt sleeping upon arrival to room satting at 70% on RA, placed on 4LO2 and woke pt, cues for deep breathing and pt recovered sats within 1.5 minutes. pt less impulsive today because more lethargic, still altered per report of wife and daughter        Exercises      General Comments General comments (skin integrity, edema, etc.): pt soiled in urine upon PT arrival to room      Pertinent Vitals/Pain Pain Assessment Pain Assessment: Faces Faces Pain Scale: Hurts little more Pain Location: back, chronic Pain Descriptors / Indicators: Grimacing, Guarding, Discomfort Pain Intervention(s): Monitored during session, Limited activity within patient's tolerance, Repositioned    Home Living                          Prior Function            PT Goals (current goals can now be found in the care plan section) Acute Rehab PT Goals Patient Stated Goal: home PT Goal Formulation: With patient/family Time For Goal Achievement: 02/23/22 Potential to Achieve Goals: Good Progress towards PT goals: Progressing toward goals    Frequency    Min 3X/week      PT Plan Current plan remains appropriate    Co-evaluation              AM-PAC PT "6 Clicks" Mobility   Outcome Measure  Help needed turning from your back to your side while in a flat bed without using bedrails?: A Lot Help needed moving from lying on your back to sitting on the side of a flat bed without using bedrails?: A Lot Help needed moving to and from a bed to a chair (including a wheelchair)?: A Lot Help needed standing up from a chair using your arms (e.g.,  wheelchair or bedside chair)?: A Lot Help needed to walk in hospital room?: A Little Help needed climbing 3-5 steps with a railing? : A Lot 6 Click Score: 13    End of Session Equipment Utilized During Treatment: Gait belt Activity Tolerance: Patient limited by fatigue Patient left: in bed;with family/visitor present;with nursing/sitter in room;with call bell/phone within reach (per family, they will be at his bedside and he will not get up without support of staff, RN okayed this.) Nurse Communication: Mobility status PT Visit Diagnosis: Muscle weakness (generalized) (M62.81);Other abnormalities of gait and mobility (R26.89);Pain     Time: 1432-1500 PT Time Calculation (min) (ACUTE ONLY): 28 min  Charges:  $Gait Training: 8-22 mins $Therapeutic Activity: 8-22 mins                     Stacie Glaze, PT DPT Acute Rehabilitation Services Pager [phone]  Office [phone]   Louis Matte 02/13/2022, 4:33 PM

## 2022-02-14 DIAGNOSIS — R112 Nausea with vomiting, unspecified: Secondary | ICD-10-CM | POA: Diagnosis not present

## 2022-02-14 LAB — PHOSPHORUS: Phosphorus: 3 mg/dL (ref 2.5–4.6)

## 2022-02-14 NOTE — Discharge Summary (Addendum)
Discharge Summary  [name] UVO:[id number] DOB: 02/14/1951  PCP: Redmond School, MD  Admit date: 02/05/2022 Discharge date: 02/14/2022  Time spent: 35 minutes.  Recommendations for Outpatient Follow-up:  Follow-up with your primary care provider in 1 to 2 days. Follow-up with your cardiologist in 1 to 2 weeks. Take your medication as prescribed. Continue PT OT with assistance and fall precautions.  Discharge Diagnoses:  Active Hospital Problems   Diagnosis Date Noted   Intractable vomiting with nausea 05/29/2016   Volume depletion 02/12/2022   Acute respiratory failure with hypoxia (HCC) 02/12/2022   A-fib ([place]) 02/12/2022   Multifocal pneumonia    GI bleeding 02/05/2022   Fatty liver 08/14/2020   Gastroparesis 01/04/2019   Debility 11/17/2018   Essential hypertension, benign 03/01/2013    Resolved Hospital Problems  No resolved problems to display.    Discharge Condition: Stable.  Diet recommendation: Resume previous diet.  Vitals:   02/14/22 0818 02/14/22 1159  BP: 113/77 124/69  Pulse: 75 71  Resp: 17 18  Temp: 97.8 F (36.6 C) 97.8 F (36.6 C)  SpO2: 96% 92%    History of present illness:  [name] is a 71 y.o. 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  Patient was recently seen by GI Dr. Jenetta Downer and underwent EGD on 12/31/21.  Patient was noted to have short segment Barrett's esophagus erosive gastritis and stigmata of recent bleeding but he did continue to have symptoms and was significantly dehydrated.   2 weeks prior to  presentation he also tested positive for COVID and was prescribed Paxil but but he could not take it.  Several days prior to this presentation patient had fever and had a fall with right-sided chest pain.  Patient then presented to the hospital.     In the ED, patient was mildly tachycardic.  Hemoccult was positive but his hemoglobin was 12.4.  CT scan of the chest abdomen pelvis showed extensive patchy infiltrate suggestive of multifocal pneumonia with minimal pleural effusion.  Patient was then considered for admission to the hospital.   Completed antibiotic therapy.   Hospital course complicated by generalized weakness and delirium.  He was assessed by PT OT with recommendation for home health PT OT.  Delirium has resolved.   02/14/2022: The patient was seen and examined at his bedside.  His wife is with him in the room.  There were no acute events overnight.  He has no complaints.  He was able to walk with a walker.  Denies any dyspnea with ambulation.  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  Patient did have a flareup of his chronic intermittent nausea and vomiting and was fairly dehydrated.  Patient does have history of gastroparesis and is on Reglan as outpatient.  Received IV fluids and Reglan Protonix while in the  hospital.   Resume home regimen. Follow-up with GI as needed.  Treated multifocal pneumonia -suspect aspiration Due to his history of nausea and vomiting.  CT chest with multifocal infiltrates.  Patient was on IV Unasyn which was changed to oral Augmentin.   The patient completed a 7-day  course of antibiotics.  Currently afebrile with no leukocytosis   Resolved acute metabolic encephalopathy, delirium CT head done on 02/11/2022 was non acute Delirium in the setting of acute illness Continue to regulate sleep and wake cycle. Continue to reorient as needed Continue fall precautions Continue to mobilize as tolerated   Resolved right-sided chest wall pain. Continue incentive spirometer Continue to mobilize as tolerated.  Resolved acute hypoxic respiratory failure   Volume depletion, resolved   Normocytic anemia on Xarelto   New onset A-fib Diagnosed on 02/08/2022.   Rate controlled on metoprolol Continue home Xarelto  Follow-up with cardiology h   History of mild CAD, ascending aortic aneurysm, hyperlipidemia. Troponin was slightly elevated in the context of A-fib.  No chest pain. 2D echocardiogram on 02/08/22 showed  EF 55 to 60%, did not show any wall motion abnormality.   Patient is on metoprolol, Ranexa, Xarelto and Crestor at home.  Continue home cardiac medications.   Essential hypertension BP stable. Continue home oral antihypertensives.   Chronic anxiety On as needed Valium.   Chronic pain Percocet as needed For which PCP.   History of lymphoma, melanoma Patient follows up with oncology at [facility].  Last chemotherapy was in 2022.  Currently in remission as per the patient and had a recent follow-up. Continue hydrocortisone 20 g in a.m. and 10 mg in p.m from home.   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   Fatty liver Ultrasound showing  early fatty changes.  Mild elevated LFT.   Irritable bowel syndrome constipation predominant.  Continue Senokot Metamucil Linzess   Generalized weakness Continue PT OT with fall precautions.    DVT prophylaxis: SCDs Start: 02/05/22 1729 rivaroxaban (XARELTO) tablet 20 mg    Code Status:     Code Status: Full Code      Family Communication: Updated wife at bedside.    Consultants:  GI   Procedures:  None   Antimicrobials:  Unasyn Augmentin.     Anti-infectives (From admission, onward)        Start     Dose/Rate Route Frequency Ordered Stop    02/12/22 2200   amoxicillin-clavulanate (AUGMENTIN) 875-125 MG per tablet 1 tablet        1 tablet Oral Every 12 hours 02/12/22 1256 02/12/22 2220    02/09/22 2200   amoxicillin-clavulanate (AUGMENTIN) 875-125 MG per tablet 1 tablet  Status:  Discontinued        1 tablet Oral Every 12 hours 02/09/22 1714 02/12/22 1256    02/06/22 0400   Ampicillin-Sulbactam (UNASYN) 3 g in sodium chloride 0.9 % 100 mL IVPB  Status:  Discontinued        3 g 200 mL/hr over 30 Minutes Intravenous Every 6 hours 02/05/22 2214 02/09/22 1714    02/05/22 1830   Ampicillin-Sulbactam (UNASYN) 3 g in sodium chloride 0.9 % 100 mL IVPB  Status:  Discontinued        3 g 200 mL/hr over 30 Minutes Intravenous Every 6 hours 02/05/22 1822 02/05/22 2214              Discharge Exam: BP 124/69   Pulse 71   Temp 97.8 F (  36.6 C) (Axillary)   Resp 18   Ht 6' (1.829 m)   Wt 112 kg   SpO2 92%   BMI 33.49 kg/m  General: 71 y.o. year-old male well developed well nourished in no acute distress.  Alert and oriented x3. Cardiovascular: Irregular rate and rhythm with no rubs or gallops.  No thyromegaly or JVD noted.   Respiratory: Clear to auscultation with no wheezes or rales. Good inspiratory effort. Abdomen: Soft nontender nondistended with normal bowel sounds x4 quadrants. Musculoskeletal: No lower extremity edema. 2/4 pulses in all 4 extremities. Skin: No ulcerative lesions noted or rashes, Psychiatry: Mood is appropriate for condition and setting  Discharge Instructions You were cared for by a hospitalist during your hospital stay. If you have any questions about your discharge medications or the care you received while you were in the hospital after you are discharged, you can call the unit and asked to speak with the hospitalist  on call if the hospitalist that took care of you is not available. Once you are discharged, your primary care physician will handle any further medical issues. Please note that NO REFILLS for any discharge medications will be authorized once you are discharged, as it is imperative that you return to your primary care physician (or establish a relationship with a primary care physician if you do not have one) for your aftercare needs so that they can reassess your need for medications and monitor your lab values.   Allergies as of 02/14/2022       Reactions   Demerol Other (See Comments)   "Makes me crazy."        Medication List     STOP taking these medications    cyclobenzaprine 10 MG tablet Commonly known as: FLEXERIL   lisinopril 10 MG tablet Commonly known as: ZESTRIL   Paxlovid (300/100) 20 x 150 MG & 10 x '100MG'$  Tbpk Generic drug: nirmatrelvir & ritonavir   polyethylene glycol powder 17 GM/SCOOP powder Commonly known as: GLYCOLAX/MIRALAX   Trulance 3 MG Tabs Generic drug: Plecanatide       TAKE these medications    Calcium Carbonate-Vitamin D 600-10 MG-MCG Tabs Take 1 tablet by mouth 2 (two) times daily.   Dexilant 60 MG capsule Generic drug: dexlansoprazole Take 60 mg by mouth daily.   diazepam 10 MG tablet Commonly known as: VALIUM Take 10 mg by mouth 4 (four) times daily as needed (for back pain).   Ester-C 500-550 MG Tabs Take 1 tablet by mouth daily.   fexofenadine 180 MG tablet Commonly known as: ALLEGRA Take 180 mg by mouth every evening.   fish oil-omega-3 fatty acids 1000 MG capsule Take 1 g by mouth 2 (two) times daily.   hydrocortisone 10 MG tablet Commonly known as: CORTEF Take 10-20 mg by mouth See admin instructions. Take 10 mg every evening and 20 mg every morning   Metamucil Fiber Chew Chew 1 each by mouth daily.   metoCLOPramide 10 MG tablet Commonly known as: REGLAN Take 1 tablet by mouth in the morning and at bedtime.    metoprolol succinate 25 MG 24 hr tablet Commonly known as: TOPROL-XL Take 1 tablet (25 mg total) by mouth daily.   montelukast 10 MG tablet Commonly known as: SINGULAIR Take 10 mg by mouth daily.   Motegrity 2 MG Tabs Generic drug: Prucalopride Succinate Take 1 tablet (2 mg total) by mouth daily.   multivitamin with minerals Tabs tablet Take 1 tablet by mouth daily.   ondansetron 8  MG tablet Commonly known as: ZOFRAN Take 1 tablet (8 mg total) by mouth every 8 (eight) hours as needed for vomiting or nausea.   oxyCODONE-acetaminophen 10-325 MG tablet Commonly known as: PERCOCET Take 1 tablet by mouth every 6 (six) hours as needed for pain.   ranolazine 500 MG 12 hr tablet Commonly known as: RANEXA Take 500 mg by mouth 2 (two) times daily.   rivaroxaban 20 MG Tabs tablet Commonly known as: XARELTO Take 1 tablet (20 mg total) by mouth daily with supper.   rosuvastatin 10 MG tablet Commonly known as: CRESTOR Take 5 mg by mouth every evening.   sildenafil 100 MG tablet Commonly known as: VIAGRA Take 50 mg by mouth as needed for erectile dysfunction.       Allergies  Allergen Reactions   Demerol Other (See Comments)    "Makes me crazy."    Follow-up [place] Follow up.   Why: Home health has been arranged. They will call you to schedule apt within 48hrs post discharge. Contact information: [place] [address] [phone]         Redmond School, MD Follow up.   Specialty: Internal Medicine Contact information: [address] [place] [address] [phone]                  The results of significant diagnostics from this hospitalization (including imaging, microbiology, ancillary and laboratory) are listed below for reference.    Significant Diagnostic Studies: CT HEAD WO CONTRAST (5MM)  Result Date: 02/11/2022 CLINICAL DATA:  Altered mental status. EXAM: CT  HEAD WITHOUT CONTRAST TECHNIQUE: Contiguous axial images were obtained from the base of the skull through the vertex without intravenous contrast. RADIATION DOSE REDUCTION: This exam was performed according to the departmental dose-optimization program which includes automated exposure control, adjustment of the mA and/or kV according to patient size and/or use of iterative reconstruction technique. COMPARISON:  March 06, 2015 FINDINGS: Brain: There is mild cerebral atrophy with widening of the extra-axial spaces and ventricular dilatation. There are areas of decreased attenuation within the white matter tracts of the supratentorial brain, consistent with microvascular disease changes. Vascular: No hyperdense vessel or unexpected calcification. Skull: Normal. Negative for fracture or focal lesion. Sinuses/Orbits: No acute finding. Other: None. IMPRESSION: Generalized cerebral atrophy and chronic white matter small vessel ischemic changes without evidence of an acute intracranial abnormality. Electronically Signed   By: Virgina Norfolk M.D.   On: 02/11/2022 20:08   IR US CHEST  Result Date: 02/11/2022 CLINICAL DATA:  Possible pleural effusion EXAM: CHEST ULTRASOUND COMPARISON:  Chest x-ray 02/10/2022 FINDINGS: Sonographic evaluation of the abdomen demonstrates no evidence of ascites or pleural effusion. Sonographic evaluation of the chest demonstrates no evidence of pleural effusion. IMPRESSION: Negative for pleural effusion and ascites. Electronically Signed   By: Jacqulynn Cadet M.D.   On: 02/11/2022 15:49   DG Chest Port 1 View  Result Date: 02/10/2022 CLINICAL DATA:  Dyspnea EXAM: PORTABLE CHEST 1 VIEW COMPARISON:  January 27, 2022, February 05, 2022 FINDINGS: Limited chest radiograph due to patient positioning and motion artifact. Partially visualized cervical and thoracic fusion hardware and spinal stimulator device. Widened mediastinal and cardiac contours, likely positional. Multifocal bilateral  heterogeneous pulmonary opacities. Moderate left and small right pleural effusions. No large pneumothorax. No acute osseous abnormality. The visualized upper abdomen is unremarkable. IMPRESSION: 1. Markedly limited chest radiograph due to patient positioning and motion artifact. Within this limitation,  there is multifocal bilateral heterogeneous pulmonary opacities, concerning for multifocal infection. 2. Moderate left and small right pleural effusions. Electronically Signed   By: Beryle Flock M.D.   On: 02/10/2022 12:26   DG Abd Portable 1V  Result Date: 02/09/2022 CLINICAL DATA:  Abdominal pain EXAM: PORTABLE ABDOMEN - 1 VIEW COMPARISON:  Previous studies including the CT done on 03-Feb-2022 FINDINGS: Bowel gas pattern is nonspecific. There is residual contrast in the colon from previous studies. There is no significant small bowel dilation. Stomach is unremarkable. There is previous surgical fusion in lumbar spine. There is previous left hip arthroplasty. Pain control lead is seen in thoracic spinal canal. IMPRESSION: Nonspecific bowel gas pattern. Electronically Signed   By: Elmer Picker M.D.   On: 02/09/2022 12:32   ECHOCARDIOGRAM COMPLETE  Result Date: 02/08/2022    ECHOCARDIOGRAM REPORT   Patient Name:   CARLYN MULLENBACH [facility] Date of Exam: 02/08/2022 Medical Rec #:  [id number]      Height:       72.0 in Accession #:    [id number]     Weight:       246.9 lb Date of Birth:  05-Feb-1951      BSA:          2.330 m Patient Age:    62 years       BP:           90/60 mmHg Patient Gender: M              HR:           74 bpm. Exam Location:  Inpatient Procedure: 2D Echo Indications:    Atrial fibrillation  History:        Patient has prior history of Echocardiogram examinations, most                 recent 05/06/2021. Risk Factors:Hypertension and Sleep Apnea.  Sonographer:    Johny Chess RDCS Referring Phys: [id number] Mark  1. Left ventricular ejection fraction, by estimation, is 55  to 60%. The left ventricle has normal function. The left ventricle has no regional wall motion abnormalities. There is mild left ventricular hypertrophy. Left ventricular diastolic parameters are indeterminate.  2. Right ventricular systolic function is normal. The right ventricular size is normal. Tricuspid regurgitation signal is inadequate for assessing PA pressure.  3. The mitral valve is normal in structure. Trivial mitral valve regurgitation. No evidence of mitral stenosis.  4. The aortic valve is tricuspid. There is mild calcification of the aortic valve. There is mild thickening of the aortic valve. Aortic valve regurgitation is mild.  5. Aortic dilatation noted. There is moderate dilatation of the aortic root, measuring 44 mm. There is moderate dilatation of the ascending aorta, measuring 43 mm.  6. The inferior vena cava is normal in size with greater than 50% respiratory variability, suggesting right atrial pressure of 3 mmHg. FINDINGS  Left Ventricle: Left ventricular ejection fraction, by estimation, is 55 to 60%. The left ventricle has normal function. The left ventricle has no regional wall motion abnormalities. The left ventricular internal cavity size was normal in size. There is  mild left ventricular hypertrophy. Left ventricular diastolic parameters are indeterminate. Right Ventricle: The right ventricular size is normal. Right vetricular wall thickness was not well visualized. Right ventricular systolic function is normal. Tricuspid regurgitation signal is inadequate for assessing PA pressure. Left Atrium: Left atrial size was normal in size. Right Atrium: Right atrial size was normal in  size. Pericardium: There is no evidence of pericardial effusion. Mitral Valve: The mitral valve is normal in structure. Trivial mitral valve regurgitation. No evidence of mitral valve stenosis. Tricuspid Valve: The tricuspid valve is not well visualized. Tricuspid valve regurgitation is trivial. No evidence of  tricuspid stenosis. Aortic Valve: The aortic valve is tricuspid. There is mild calcification of the aortic valve. There is mild thickening of the aortic valve. There is mild aortic valve annular calcification. Aortic valve regurgitation is mild. Pulmonic Valve: The pulmonic valve was not well visualized. Pulmonic valve regurgitation is not visualized. No evidence of pulmonic stenosis. Aorta: Aortic dilatation noted. There is moderate dilatation of the aortic root, measuring 44 mm. There is moderate dilatation of the ascending aorta, measuring 43 mm. Venous: The inferior vena cava is normal in size with greater than 50% respiratory variability, suggesting right atrial pressure of 3 mmHg. IAS/Shunts: The interatrial septum was not well visualized.  LEFT VENTRICLE PLAX 2D LVIDd:         5.40 cm   Diastology LVIDs:         4.00 cm   LV e' medial:    5.87 cm/s LV PW:         1.20 cm   LV E/e' medial:  11.0 LV IVS:        1.40 cm   LV e' lateral:   8.05 cm/s LVOT diam:     2.60 cm   LV E/e' lateral: 8.0 LV SV:         63 LV SV Index:   27 LVOT Area:     5.31 cm  RIGHT VENTRICLE             IVC RV S prime:     14.30 cm/s  IVC diam: 2.70 cm TAPSE (M-mode): 2.0 cm LEFT ATRIUM             Index        RIGHT ATRIUM           Index LA diam:        4.70 cm 2.02 cm/m   RA Area:     16.40 cm LA Vol (A2C):   57.9 ml 24.85 ml/m  RA Volume:   38.70 ml  16.61 ml/m LA Vol (A4C):   79.0 ml 33.90 ml/m LA Biplane Vol: 71.3 ml 30.60 ml/m  AORTIC VALVE LVOT Vmax:   63.90 cm/s LVOT Vmean:  41.900 cm/s LVOT VTI:    0.119 m  AORTA Ao Root diam: 4.40 cm Ao Asc diam:  4.30 cm MITRAL VALVE MV Area (PHT): 3.42 cm    SHUNTS MV Decel Time: 222 msec    Systemic VTI:  0.12 m MV E velocity: 64.50 cm/s  Systemic Diam: 2.60 cm MV A velocity: 61.30 cm/s MV E/A ratio:  1.05 Ruben Dolly MD Electronically signed by Ruben Dolly MD Signature Date/Time: 02/08/2022/4:55:02 PM    Final    US Abdomen Limited RUQ (LIVER/GB)  Result Date:  02/05/2022 CLINICAL DATA:  Right upper quadrant abdominal pain. History of cirrhosis. EXAM: ULTRASOUND ABDOMEN LIMITED RIGHT UPPER QUADRANT COMPARISON:  CT the chest, abdomen and pelvis-earlier same day FINDINGS: Gallbladder: Normal sonographic appearance of the gallbladder. No gallbladder wall thickening or pericholecystic stranding. No echogenic gallstones or biliary sludge. Negative sonographic Murphy's sign. Common bile duct: Diameter: Normal in size measuring 4 mm in diameter Liver: Normal echogenicity of the hepatic parenchyma however there is questioned mild nodularity of the hepatic contour (representative image 26). No discrete hepatic  lesions. No intrahepatic biliary dilatation. Portal vein is patent on color Doppler imaging with normal direction of blood flow towards the liver. Other: None. IMPRESSION: Suspected mild nodularity of the hepatic contour with preservation of the hepatic parenchymal echogenicity, nonspecific though could be seen in the setting of early cirrhotic change. Correlation with LFTs is advised. Electronically Signed   By: Sandi Mariscal M.D.   On: 02/05/2022 15:20   CT CHEST ABDOMEN PELVIS W CONTRAST  Result Date: 02/05/2022 CLINICAL DATA:  Abdominal pain, weakness EXAM: CT CHEST, ABDOMEN, AND PELVIS WITH CONTRAST TECHNIQUE: Multidetector CT imaging of the chest, abdomen and pelvis was performed following the standard protocol during bolus administration of intravenous contrast. RADIATION DOSE REDUCTION: This exam was performed according to the departmental dose-optimization program which includes automated exposure control, adjustment of the mA and/or kV according to patient size and/or use of iterative reconstruction technique. CONTRAST:  145m OMNIPAQUE IOHEXOL 300 MG/ML  SOLN COMPARISON:  Previous studies including the CT abdomen and pelvis done on 01/27/2022 FINDINGS: CT CHEST FINDINGS Cardiovascular: There is homogeneous enhancement in thoracic aorta. There is aneurysmal dilation  of ascending thoracic aorta measuring 4.3 cm. There are no intraluminal filling defects in central pulmonary artery branches. Peripheral pulmonary artery branches are not adequately visualized for evaluation. Coronary artery calcifications are seen. Mediastinum/Nodes: Unremarkable. Lungs/Pleura: Fairly extensive new patchy ground-glass infiltrates are noted in both lungs. There is minimal bilateral pleural effusions. There is no pneumothorax. Musculoskeletal: There is surgical fusion in cervical and upper thoracic spine. Pain control lead is seen in thoracic spinal canal. CT ABDOMEN PELVIS FINDINGS Hepatobiliary: There is fatty infiltration. Gallbladder is distended. There is no wall thickening. There is no fluid around the gallbladder. There is no dilation of bile ducts. Pancreas: No focal abnormalities are seen. Spleen: Spleen is enlarged measuring 16 cm in maximum diameter. Adrenals/Urinary Tract: Adrenals are unremarkable. There is no hydronephrosis. There is 4.3 cm parapelvic cyst in the upper pole of left kidney. No follow-up imaging is recommended. There are no renal or ureteral stones. Urinary bladder is unremarkable. Stomach/Bowel: Small hiatal hernia is seen. There are surgical clips in the posterior mediastinum adjacent to the hiatal hernia. Small bowel loops are not dilated. Appendix is not dilated. Scattered diverticula are seen in colon without signs of focal acute diverticulitis. Vascular/Lymphatic: Arterial calcifications are seen. Reproductive: Unremarkable. Other: There is no ascites or pneumoperitoneum. There is previous ventral hernia repair. Umbilical hernia containing fat is seen. Bilateral inguinal hernias containing fat are seen, larger on the right side. Musculoskeletal: There is previous left hip arthroplasty. Lumbar surgical fusion is seen. Decrease in height of body of T4 vertebra has not changed. No recent fracture is seen. IMPRESSION: New extensive patchy infiltrates are seen in both  lungs suggesting multifocal pneumonia. Minimal bilateral pleural effusions. Coronary artery disease. 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 Recommend annual imaging followup by CTA or MRA. This recommendation follows 2010 ACCF/AHA/AATS/ACR/ASA/SCA/SCAI/SIR/STS/SVM Guidelines for the Diagnosis and Management of Patients with Thoracic Aortic Disease. Circulation. 2010; 121:: Z660-Y301 Aortic aneurysm NOS (ICD10-I71.9) There is no evidence of intestinal obstruction or pneumoperitoneum. Appendix is not dilated. There is no hydronephrosis. Fatty liver. Small hiatal hernia. Splenomegaly. There is parapelvic cyst in the left kidney. Diverticulosis of colon without signs of focal diverticulitis. Other findings as described in the body of the report. Electronically Signed   By: PElmer PickerM.D.   On: 02/05/2022 13:26   CT ABDOMEN PELVIS W CONTRAST  Result Date: 01/27/2022 CLINICAL DATA:  Generalized  abdominal pain, fell at 0430 hours this morning going to the bathroom, weakness, RIGHT rib pain EXAM: CT ABDOMEN AND PELVIS WITH CONTRAST TECHNIQUE: Multidetector CT imaging of the abdomen and pelvis was performed using the standard protocol following bolus administration of intravenous contrast. RADIATION DOSE REDUCTION: This exam was performed according to the departmental dose-optimization program which includes automated exposure control, adjustment of the mA and/or kV according to patient size and/or use of iterative reconstruction technique. CONTRAST:  186m OMNIPAQUE IOHEXOL 300 MG/ML SOLN IV. No oral contrast. COMPARISON:  PET-CT 06/05/2020 FINDINGS: Lower chest: Minimal LEFT basilar atelectasis. Hepatobiliary: Gallbladder and liver normal appearance Pancreas: Multiple calcifications within or adjacent to uncinate process of pancreas without soft tissue mass or cyst. Otherwise normal appearance. Spleen: Normal appearance Adrenals/Urinary Tract: Adrenal glands normal appearance.  Peripelvic cyst LEFT kidney 4.3 x 3.1 cm image 31 unchanged, simple features by CT; no follow-up imaging recommended. Small nonobstructing renal calculi. No hydronephrosis or hydroureter. Bladder unremarkable. Stomach/Bowel: Descending and sigmoid diverticulosis without evidence of diverticulitis. Normal appendix. Stomach and remaining bowel loops unremarkable. Vascular/Lymphatic: Atherosclerotic calcifications aorta, iliac arteries, coronary arteries. Aorta normal caliber. No adenopathy. Reproductive: Minimal prostatic enlargement. Seminal vesicles unremarkable. Other: BILATERAL hernias containing fat.  No free air or free fluid. Musculoskeletal: LEFT hip prosthesis. Osseous demineralization. Prior lumbosacral fusion L1-S1. Intraspinal stimulator. No acute fractures. IMPRESSION: BILATERAL hernias containing fat. Distal colonic diverticulosis without evidence of diverticulitis. Small nonobstructing renal calculi. No acute intra-abdominal or intrapelvic abnormalities. Aortic Atherosclerosis (ICD10-I70.0). Electronically Signed   By: MLavonia DanaM.D.   On: 01/27/2022 13:46   DG Ribs Unilateral W/Chest Right  Result Date: 01/27/2022 CLINICAL DATA:  Fall with right rib pain EXAM: RIGHT RIBS AND CHEST - 3+ VIEW COMPARISON:  None Available. FINDINGS: No fracture or other bone lesions are seen involving the ribs. There is no evidence of pneumothorax or pleural effusion. Both lungs are clear. Heart size and mediastinal contours are within normal limits. IMPRESSION: Negative. Electronically Signed   By: MNelson ChimesM.D.   On: 01/27/2022 11:44   DG Shoulder Right  Result Date: 01/27/2022 CLINICAL DATA:  FGolden Circletoday with shoulder pain EXAM: RIGHT SHOULDER - 2+ VIEW COMPARISON:  None Available. FINDINGS: Glenohumeral joint is normal. No evidence of regional fracture. Mild widening of the [facility] Center[facility] which could be chronic or indicate a minor AC joint injury. IMPRESSION: No fracture or dislocation. Question mild widening of  the [facility] Hospital[facility] which could be chronic or could indicate a ligamentous injury. Electronically Signed   By: MNelson ChimesM.D.   On: 01/27/2022 11:43    Microbiology: Recent Results (from the past 240 hour(s))  SARS Coronavirus 2 by RT PCR (hospital order[facility]) *cepheid single result test* Anterior Nasal Swab     Status: None   Collection Time: 02/05/22 12:30 PM   Specimen: Anterior Nasal Swab  Result Value Ref Range Status   SARS Coronavirus 2 by RT PCR NEGATIVE NEGATIVE Final    Comment: (NOTE) SARS-CoV-2 target nucleic acids are NOT DETECTED.  The SARS-CoV-2 RNA is generally detectable in upper and lower respiratory specimens during the acute phase of infection. The lowest concentration of SARS-CoV-2 viral copies this assay can detect is 250 copies / mL. A negative result does not preclude SARS-CoV-2 infection and should not be used as the sole basis for treatment or other patient management decisions.  A negative result may occur with improper specimen collection / handling, submission of specimen other than nasopharyngeal swab,  presence of viral mutation(s) within the areas targeted by this assay, and inadequate number of viral copies (<250 copies / mL). A negative result must be combined with clinical observations, patient history, and epidemiological information.  Fact Sheet for Patients:   https://www.patel.info/  Fact Sheet for Healthcare Providers: https://Altagracia Rone.com/  This test is not yet approved or  cleared by the [place] FDA and has been authorized for detection and/or diagnosis of SARS-CoV-2 by FDA under an Emergency Use Authorization (EUA).  This EUA will remain in effect (meaning this test can be used) for the duration of the COVID-19 declaration under Section 564(b)(1) of the Act, 21 U.S.C. section 360bbb-3(b)(1), unless the authorization is terminated or revoked sooner.  Performed at  [facility], [place] [address] [place], [place] [address]      Labs: Basic Metabolic Panel: Recent Labs  Lab 02/08/22 0146 02/09/22 0233 02/11/22 0820 02/12/22 0310 02/13/22 0343  NA 140 137 139 134* 140  K 3.3* 4.2 4.1 3.6 3.5  CL 110 108 108 103 108  CO2 '22 22 23 23 24  '$ GLUCOSE 86 110* 80 101* 96  BUN '10 13 22 19 14  '$ CREATININE 0.95 1.13 1.05 0.99 0.95  CALCIUM 7.0* 7.7* 7.9* 7.9* 7.9*  MG 1.2*  --   --   --  2.0  PHOS 1.4*  --   --   --  3.0   Liver Function Tests: No results for input(s): "AST", "ALT", "ALKPHOS", "BILITOT", "PROT", "ALBUMIN" in the last 168 hours. No results for input(s): "LIPASE", "AMYLASE" in the last 168 hours. No results for input(s): "AMMONIA" in the last 168 hours. CBC: Recent Labs  Lab 02/08/22 0146 02/09/22 0233 02/11/22 0820 02/12/22 0310 02/13/22 0343  WBC 5.5 3.8* 4.3 5.0 5.4  NEUTROABS 4.4 2.7  --  3.6  --   HGB 9.9* 10.5* 10.8* 10.8* 9.1*  HCT 28.8* 31.4* 32.7* 32.0* 28.3*  MCV 92.9 92.9 93.7 91.7 94.6  PLT 103* 113* 128* 129* 112*   Cardiac Enzymes: No results for input(s): "CKTOTAL", "CKMB", "CKMBINDEX", "TROPONINI" in the last 168 hours. BNP: BNP (last 3 results) No results for input(s): "BNP" in the last 8760 hours.  ProBNP (last 3 results) No results for input(s): "PROBNP" in the last 8760 hours.  CBG: No results for input(s): "GLUCAP" in the last 168 hours.     Signed:  Kayleen Memos, MD [facility] 02/14/2022, 5:27 PM

## 2022-02-14 NOTE — Care Management Important Message (Signed)
Important Message  Patient Details  Name: [name] MRN: [id number] Date of Birth: Apr 16, 1951   Medicare Important Message Given:  Yes     Hannah Beat 02/14/2022, 2:29 PM

## 2022-02-14 NOTE — TOC Transition Note (Signed)
Transition of Care [facility]) - CM/SW Discharge Note   Patient Details  Name: [name] MRN: [id number] Date of Birth: 1951/02/07  [facility] CM/SW Contact:  Verdell Carmine, RN Phone Number: 02/14/2022, 1:26 PM   Clinical Narrative:    Coralee Pesa of [facility] orders PT OT. She has already looked at the DC order.     Final next level of care: [place] Barriers to Discharge: No Barriers Identified   Patient Goals and CMS Choice Patient states their goals for this hospitalization and ongoing recovery are:: return home CMS Medicare.gov Compare Post Acute Care list provided to:: Patient Represenative (must comment) Choice offered to / list presented to : Patient, Spouse  Discharge Placement                 Home with [facility]      Discharge Plan and Services   Discharge Planning Services: CM Consult Post Acute Care Choice: [place]: PT [place]: [place] ([facility]) Date HH Agency Contacted: 02/10/22 Time [place]: 1829 Representative spoke with at [place]: [place] ([place]) Interventions     Readmission Risk Interventions     No data to display

## 2022-02-23 ENCOUNTER — Other Ambulatory Visit: Payer: Self-pay

## 2022-02-23 ENCOUNTER — Emergency Department (HOSPITAL_COMMUNITY): Payer: Medicare Other

## 2022-02-23 ENCOUNTER — Encounter (HOSPITAL_COMMUNITY): Payer: Self-pay

## 2022-02-23 ENCOUNTER — Inpatient Hospital Stay (HOSPITAL_COMMUNITY)
Admission: EM | Admit: 2022-02-23 | Discharge: 2022-02-28 | DRG: 871 | Disposition: A | Discharge status: Home Health | Payer: Medicare Other | Attending: Internal Medicine | Admitting: Internal Medicine

## 2022-02-23 DIAGNOSIS — I10 Essential (primary) hypertension: Secondary | ICD-10-CM | POA: Diagnosis present

## 2022-02-23 DIAGNOSIS — R627 Adult failure to thrive: Secondary | ICD-10-CM | POA: Diagnosis present

## 2022-02-23 DIAGNOSIS — Z7901 Long term (current) use of anticoagulants: Secondary | ICD-10-CM

## 2022-02-23 DIAGNOSIS — G9341 Metabolic encephalopathy: Secondary | ICD-10-CM

## 2022-02-23 DIAGNOSIS — F1120 Opioid dependence, uncomplicated: Secondary | ICD-10-CM | POA: Diagnosis present

## 2022-02-23 DIAGNOSIS — Z85820 Personal history of malignant melanoma of skin: Secondary | ICD-10-CM | POA: Diagnosis not present

## 2022-02-23 DIAGNOSIS — Z8616 Personal history of COVID-19: Secondary | ICD-10-CM | POA: Diagnosis not present

## 2022-02-23 DIAGNOSIS — G8929 Other chronic pain: Secondary | ICD-10-CM | POA: Diagnosis present

## 2022-02-23 DIAGNOSIS — Z86711 Personal history of pulmonary embolism: Secondary | ICD-10-CM

## 2022-02-23 DIAGNOSIS — K760 Fatty (change of) liver, not elsewhere classified: Secondary | ICD-10-CM | POA: Diagnosis present

## 2022-02-23 DIAGNOSIS — N390 Urinary tract infection, site not specified: Secondary | ICD-10-CM | POA: Diagnosis present

## 2022-02-23 DIAGNOSIS — R410 Disorientation, unspecified: Secondary | ICD-10-CM

## 2022-02-23 DIAGNOSIS — F419 Anxiety disorder, unspecified: Secondary | ICD-10-CM | POA: Diagnosis present

## 2022-02-23 DIAGNOSIS — I2510 Atherosclerotic heart disease of native coronary artery without angina pectoris: Secondary | ICD-10-CM | POA: Diagnosis present

## 2022-02-23 DIAGNOSIS — G9340 Encephalopathy, unspecified: Secondary | ICD-10-CM | POA: Diagnosis present

## 2022-02-23 DIAGNOSIS — K3184 Gastroparesis: Secondary | ICD-10-CM | POA: Diagnosis not present

## 2022-02-23 DIAGNOSIS — Z86718 Personal history of other venous thrombosis and embolism: Secondary | ICD-10-CM | POA: Diagnosis not present

## 2022-02-23 DIAGNOSIS — J189 Pneumonia, unspecified organism: Secondary | ICD-10-CM | POA: Diagnosis present

## 2022-02-23 DIAGNOSIS — I480 Paroxysmal atrial fibrillation: Secondary | ICD-10-CM

## 2022-02-23 DIAGNOSIS — E7800 Pure hypercholesterolemia, unspecified: Secondary | ICD-10-CM | POA: Diagnosis present

## 2022-02-23 DIAGNOSIS — A419 Sepsis, unspecified organism: Secondary | ICD-10-CM | POA: Diagnosis present

## 2022-02-23 DIAGNOSIS — J9601 Acute respiratory failure with hypoxia: Principal | ICD-10-CM | POA: Diagnosis present

## 2022-02-23 DIAGNOSIS — R6520 Severe sepsis without septic shock: Secondary | ICD-10-CM | POA: Diagnosis present

## 2022-02-23 DIAGNOSIS — Z807 Family history of other malignant neoplasms of lymphoid, hematopoietic and related tissues: Secondary | ICD-10-CM

## 2022-02-23 DIAGNOSIS — K589 Irritable bowel syndrome without diarrhea: Secondary | ICD-10-CM | POA: Diagnosis present

## 2022-02-23 DIAGNOSIS — D696 Thrombocytopenia, unspecified: Secondary | ICD-10-CM | POA: Diagnosis present

## 2022-02-23 DIAGNOSIS — K219 Gastro-esophageal reflux disease without esophagitis: Secondary | ICD-10-CM | POA: Diagnosis present

## 2022-02-23 DIAGNOSIS — C8307 Small cell B-cell lymphoma, spleen: Secondary | ICD-10-CM | POA: Diagnosis present

## 2022-02-23 DIAGNOSIS — Z87891 Personal history of nicotine dependence: Secondary | ICD-10-CM | POA: Diagnosis not present

## 2022-02-23 DIAGNOSIS — Z9861 Coronary angioplasty status: Secondary | ICD-10-CM

## 2022-02-23 DIAGNOSIS — Z8249 Family history of ischemic heart disease and other diseases of the circulatory system: Secondary | ICD-10-CM | POA: Diagnosis not present

## 2022-02-23 DIAGNOSIS — Z808 Family history of malignant neoplasm of other organs or systems: Secondary | ICD-10-CM

## 2022-02-23 LAB — CBC WITH DIFFERENTIAL/PLATELET
Abs Immature Granulocytes: 0.11 10*3/uL — ABNORMAL HIGH (ref 0.00–0.07)
Basophils Absolute: 0 10*3/uL (ref 0.0–0.1)
Basophils Relative: 0 %
Eosinophils Absolute: 0 10*3/uL (ref 0.0–0.5)
Eosinophils Relative: 0 %
HCT: 38.9 % — ABNORMAL LOW (ref 39.0–52.0)
Hemoglobin: 13.2 g/dL (ref 13.0–17.0)
Immature Granulocytes: 1 %
Lymphocytes Relative: 3 %
Lymphs Abs: 0.5 10*3/uL — ABNORMAL LOW (ref 0.7–4.0)
MCH: 31.4 pg (ref 26.0–34.0)
MCHC: 33.9 g/dL (ref 30.0–36.0)
MCV: 92.6 fL (ref 80.0–100.0)
Monocytes Absolute: 0.9 10*3/uL (ref 0.1–1.0)
Monocytes Relative: 7 %
Neutro Abs: 13 10*3/uL — ABNORMAL HIGH (ref 1.7–7.7)
Neutrophils Relative %: 89 %
Platelets: 113 10*3/uL — ABNORMAL LOW (ref 150–400)
RBC: 4.2 MIL/uL — ABNORMAL LOW (ref 4.22–5.81)
RDW: 14.7 % (ref 11.5–15.5)
WBC: 14.6 10*3/uL — ABNORMAL HIGH (ref 4.0–10.5)
nRBC: 0 % (ref 0.0–0.2)

## 2022-02-23 LAB — LACTIC ACID, PLASMA
Lactic Acid, Venous: 1.4 mmol/L (ref 0.5–1.9)
Lactic Acid, Venous: 1.3 mmol/L (ref 0.5–1.9)

## 2022-02-23 LAB — BLOOD GAS, ARTERIAL
Acid-Base Excess: 0.3 mmol/L (ref 0.0–2.0)
Bicarbonate: 23.3 mmol/L (ref 20.0–28.0)
Drawn by: 10555
FIO2: 36 %
O2 Saturation: 98.5 %
Patient temperature: 37
pCO2 arterial: 32 mmHg (ref 32–48)
pH, Arterial: 7.47 — ABNORMAL HIGH (ref 7.35–7.45)
pO2, Arterial: 87 mmHg (ref 83–108)

## 2022-02-23 LAB — COMPREHENSIVE METABOLIC PANEL
ALT: 17 U/L (ref 0–44)
AST: 24 U/L (ref 15–41)
Albumin: 4 g/dL (ref 3.5–5.0)
Alkaline Phosphatase: 68 U/L (ref 38–126)
Anion gap: 12 (ref 5–15)
BUN: 17 mg/dL (ref 8–23)
CO2: 23 mmol/L (ref 22–32)
Calcium: 9.2 mg/dL (ref 8.9–10.3)
Chloride: 100 mmol/L (ref 98–111)
Creatinine, Ser: 1.19 mg/dL (ref 0.61–1.24)
GFR, Estimated: >60 mL/min (ref >60)
Glucose, Bld: 110 mg/dL — ABNORMAL HIGH (ref 70–99)
Potassium: 3.9 mmol/L (ref 3.5–5.1)
Sodium: 135 mmol/L (ref 135–145)
Total Bilirubin: 1.2 mg/dL (ref 0.3–1.2)
Total Protein: 7.9 g/dL (ref 6.5–8.1)

## 2022-02-23 LAB — URINALYSIS, ROUTINE W REFLEX MICROSCOPIC
Bacteria, UA: NONE SEEN
Bilirubin Urine: NEGATIVE
Glucose, UA: NEGATIVE mg/dL
Ketones, ur: 20 mg/dL — AB
Nitrite: NEGATIVE
Protein, ur: 100 mg/dL — AB
RBC / HPF: >50 RBC/hpf — ABNORMAL HIGH (ref 0–5)
Specific Gravity, Urine: 1.014 (ref 1.005–1.030)
WBC, UA: >50 WBC/hpf — ABNORMAL HIGH (ref 0–5)
pH: 8 (ref 5.0–8.0)

## 2022-02-23 LAB — SARS CORONAVIRUS 2 BY RT PCR: SARS Coronavirus 2 by RT PCR: NEGATIVE

## 2022-02-23 LAB — TROPONIN I (HIGH SENSITIVITY)
Troponin I (High Sensitivity): 8 ng/L (ref <18)
Troponin I (High Sensitivity): 8 ng/L (ref <18)

## 2022-02-23 LAB — AMMONIA: Ammonia: 13 umol/L (ref 9–35)

## 2022-02-23 LAB — LIPASE, BLOOD: Lipase: 53 U/L — ABNORMAL HIGH (ref 11–51)

## 2022-02-23 LAB — TSH: TSH: 0.841 u[IU]/mL (ref 0.350–4.500)

## 2022-02-23 MED ORDER — OYSTER SHELL CALCIUM/D3 500-5 MG-MCG PO TABS
1.0000 | ORAL_TABLET | Freq: Two times a day (BID) | ORAL | Status: DC
  Administered 2022-02-23 – 2022-02-28 (×10): 1 via ORAL
  Filled 2022-02-23 (×10): qty 1

## 2022-02-23 MED ORDER — METOCLOPRAMIDE HCL 10 MG PO TABS
10.0000 mg | ORAL_TABLET | Freq: Three times a day (TID) | ORAL | Status: DC
  Administered 2022-02-24 – 2022-02-28 (×14): 10 mg via ORAL
  Filled 2022-02-23 (×14): qty 1

## 2022-02-23 MED ORDER — ACETAMINOPHEN 650 MG RE SUPP
650.0000 mg | Freq: Four times a day (QID) | RECTAL | Status: DC | PRN
  Filled 2022-02-23: qty 1

## 2022-02-23 MED ORDER — HYDROCORTISONE SOD SUC (PF) 100 MG IJ SOLR
100.0000 mg | Freq: Two times a day (BID) | INTRAMUSCULAR | Status: DC
  Administered 2022-02-23 – 2022-02-25 (×4): 100 mg via INTRAVENOUS
  Filled 2022-02-23 (×4): qty 2

## 2022-02-23 MED ORDER — ACETAMINOPHEN 325 MG PO TABS
650.0000 mg | ORAL_TABLET | Freq: Four times a day (QID) | ORAL | Status: DC | PRN
  Administered 2022-02-23: 650 mg via ORAL

## 2022-02-23 MED ORDER — ADULT MULTIVITAMIN W/MINERALS CH
1.0000 | ORAL_TABLET | Freq: Every day | ORAL | Status: DC
  Administered 2022-02-24 – 2022-02-28 (×5): 1 via ORAL
  Filled 2022-02-23 (×5): qty 1

## 2022-02-23 MED ORDER — VANCOMYCIN HCL 1500 MG/300ML IV SOLN: 1500.0000 mg | INTRAVENOUS | Status: DC

## 2022-02-23 MED ORDER — METOPROLOL SUCCINATE ER 25 MG PO TB24
25.0000 mg | ORAL_TABLET | Freq: Every day | ORAL | Status: DC
  Administered 2022-02-24: 25 mg via ORAL
  Filled 2022-02-23: qty 1

## 2022-02-23 MED ORDER — METRONIDAZOLE 500 MG/100ML IV SOLN
500.0000 mg | Freq: Two times a day (BID) | INTRAVENOUS | Status: DC
  Administered 2022-02-23 – 2022-02-25 (×4): 500 mg via INTRAVENOUS
  Filled 2022-02-23 (×4): qty 100

## 2022-02-23 MED ORDER — VANCOMYCIN HCL IN DEXTROSE 1-5 GM/200ML-% IV SOLN: 1000.0000 mg | Freq: Once | INTRAVENOUS | Status: DC

## 2022-02-23 MED ORDER — PANTOPRAZOLE SODIUM 40 MG PO TBEC
40.0000 mg | DELAYED_RELEASE_TABLET | Freq: Every day | ORAL | Status: DC
  Administered 2022-02-23 – 2022-02-28 (×6): 40 mg via ORAL
  Filled 2022-02-23 (×7): qty 1

## 2022-02-23 MED ORDER — SODIUM CHLORIDE 0.9 % IV SOLN
1.0000 g | Freq: Once | INTRAVENOUS | Status: AC
  Administered 2022-02-23: 1 g via INTRAVENOUS
  Filled 2022-02-23: qty 10

## 2022-02-23 MED ORDER — OMEGA-3-ACID ETHYL ESTERS 1 G PO CAPS
1.0000 g | ORAL_CAPSULE | Freq: Two times a day (BID) | ORAL | Status: DC
  Administered 2022-02-23 – 2022-02-28 (×10): 1 g via ORAL
  Filled 2022-02-23 (×10): qty 1

## 2022-02-23 MED ORDER — PSYLLIUM 95 % PO PACK
1.0000 | PACK | Freq: Every day | ORAL | Status: DC
  Filled 2022-02-23 (×3): qty 1

## 2022-02-23 MED ORDER — ROSUVASTATIN CALCIUM 10 MG PO TABS
5.0000 mg | ORAL_TABLET | Freq: Every evening | ORAL | Status: DC
  Administered 2022-02-23 – 2022-02-27 (×5): 5 mg via ORAL
  Filled 2022-02-23 (×6): qty 1

## 2022-02-23 MED ORDER — SODIUM CHLORIDE 0.9 % IV SOLN
2.0000 g | Freq: Three times a day (TID) | INTRAVENOUS | Status: DC
  Administered 2022-02-24 – 2022-02-28 (×14): 2 g via INTRAVENOUS
  Filled 2022-02-23 (×14): qty 12.5

## 2022-02-23 MED ORDER — ACETAMINOPHEN 650 MG RE SUPP: 650.0000 mg | Freq: Once | RECTAL | Status: DC

## 2022-02-23 MED ORDER — VANCOMYCIN HCL 2000 MG/400ML IV SOLN
2000.0000 mg | Freq: Once | INTRAVENOUS | Status: AC
  Administered 2022-02-23: 2000 mg via INTRAVENOUS
  Filled 2022-02-23: qty 400

## 2022-02-23 MED ORDER — ONDANSETRON HCL 4 MG/2ML IJ SOLN: 4.0000 mg | Freq: Four times a day (QID) | INTRAMUSCULAR | Status: DC | PRN

## 2022-02-23 MED ORDER — RIVAROXABAN 20 MG PO TABS
20.0000 mg | ORAL_TABLET | Freq: Every day | ORAL | Status: DC
  Administered 2022-02-23 – 2022-02-27 (×5): 20 mg via ORAL
  Filled 2022-02-23 (×6): qty 1

## 2022-02-23 MED ORDER — SODIUM CHLORIDE 0.9 % IV SOLN
2.0000 g | Freq: Once | INTRAVENOUS | Status: AC
  Administered 2022-02-23: 2 g via INTRAVENOUS
  Filled 2022-02-23: qty 12.5

## 2022-02-23 MED ORDER — SODIUM CHLORIDE 0.9 % IV BOLUS
1000.0000 mL | Freq: Once | INTRAVENOUS | Status: AC
  Administered 2022-02-23: 1000 mL via INTRAVENOUS

## 2022-02-23 MED ORDER — PRUCALOPRIDE SUCCINATE 2 MG PO TABS: 1.0000 | ORAL_TABLET | Freq: Every day | ORAL | Status: DC

## 2022-02-23 MED ORDER — ONDANSETRON HCL 4 MG PO TABS: 4.0000 mg | ORAL_TABLET | Freq: Four times a day (QID) | ORAL | Status: DC | PRN

## 2022-02-23 MED ORDER — LACTATED RINGERS IV SOLN: INTRAVENOUS | Status: AC

## 2022-02-23 MED ORDER — RANOLAZINE ER 500 MG PO TB12
500.0000 mg | ORAL_TABLET | Freq: Two times a day (BID) | ORAL | Status: DC
  Administered 2022-02-23 – 2022-02-28 (×10): 500 mg via ORAL
  Filled 2022-02-23 (×10): qty 1

## 2022-02-23 NOTE — ED Notes (Signed)
Ice packs placed on pt

## 2022-02-23 NOTE — Assessment & Plan Note (Signed)
Paroxysmal Continue metoprolol for rate control Continue Xarelto as primary prophylaxis for an acute stroke

## 2022-02-23 NOTE — Assessment & Plan Note (Signed)
Continue Protonix °

## 2022-02-23 NOTE — Assessment & Plan Note (Signed)
Patient is oriented only to person and place which is not his baseline Acute encephalopathy appears to be secondary to delirium Unclear etiology for his delirium and most likely from an infectious process Hold benzodiazepines and opioids for now Monitor patient closely Place patient on fall precautions He would likely need a sitter if his wife leaves

## 2022-02-23 NOTE — ED Notes (Signed)
PO meds held at this time due to pt not A&O, not able to sit up, not able to follow commands. Agb[place], MD made aware.

## 2022-02-23 NOTE — Progress Notes (Signed)
Pharmacy Antibiotic Note  [name] is a 71 y.o. male admitted on 02/23/2022 with  unknown source of infection .  Pharmacy has been consulted for vancomycin and cefepime dosing.  Plan: Vancomycin 2000 mg IV x 1 dose Vancomycin 1500 mg IV every 24 hours Cefepime 2000 mg IV every 8 hours. Monitor labs, c/s, and vanco level as indicated.  Height: 6' (182.9 cm) Weight: 111.6 kg (246 lb) IBW/kg (Calculated) : 77.6  Temp (24hrs), Avg:99.7 F (37.6 C), Min:99.7 F (37.6 C), Max:99.7 F (37.6 C)  Recent Labs  Lab 02/23/22 1631 02/23/22 1903  WBC 14.6*  --   CREATININE 1.19  --   LATICACIDVEN 1.4 1.3    Estimated Creatinine Clearance: 74.5 mL/min (by C-G formula based on SCr of 1.19 mg/dL).    Allergies  Allergen Reactions   Demerol Other (See Comments)    "Makes me crazy."    Antimicrobials this admission: Vanco 9/24 >> Cefepime 9/24 >> Flagyl 9/24 >>  Microbiology results: 9/24 BCx: pending 9/24 UCx: pending    Thank you for allowing pharmacy to be a part of this patient's care.  Ramond Craver 02/23/2022 7:54 PM

## 2022-02-23 NOTE — Assessment & Plan Note (Signed)
Patient noted to have increased work of breathing, tachypnea with room air pulse oximetry of 88% and is currently on 3 L of oxygen to maintain pulse oximetry greater than 92%. Follow-up results of CT scan of the chest for further evaluation Will need assessment for home oxygen prior to discharge

## 2022-02-23 NOTE — ED Provider Notes (Signed)
Emergency Department Provider Note   I have reviewed the triage vital signs and the nursing notes.   HISTORY  Chief Complaint Weakness   HPI [name] is a 71 y.o. male with past history of high cholesterol, hypertension, ascending aortic aneurysm, Barrett's esophagitis, and recent admit for pneumonia and delirium returns with generalized weakness and confusion.  Family at bedside describe patient confused over at least the last 3 weeks.  They report he left the hospital confused and weak.  He completed antibiotics and has not started any other new medications.  He remains anticoagulated.  No falls or head injuries.  He does not eat or drink much and states that this seemed to occur around the time of his COVID-19 infection.  Apparently attempted antiviral treatment but could not tolerate.  He does have chronic gastritis and nausea/vomiting which is continued and family note has worsened.    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  Cardiovascular: Denies chest pain. Respiratory: Denies shortness of breath. Gastrointestinal: No abdominal pain. Ongoing nausea and vomiting.  No diarrhea.  No constipation. Genitourinary: Urine is dark and less frequent.  Musculoskeletal: Negative for back pain. Skin:  Negative for rash. Neurological: Negative for headaches.  ____________________________________________   PHYSICAL EXAM:  VITAL SIGNS: Vitals:   02/23/22 1730 02/23/22 1830  BP:  138/77  Pulse:  90  Resp:  (!) 30  Temp: 99.7 F (37.6 C)   SpO2:  97%  Initial RA saturation with EMS 88%.   Constitutional: Alert but confused. Oriented to person only. Conversational but confused.  Eyes: Conjunctivae are normal.  Head: Atraumatic. Nose: No congestion/rhinnorhea. Mouth/Throat: Mucous membranes are dry.  Neck: No stridor.   Cardiovascular: Tachycardia. Good peripheral circulation. Grossly normal heart sounds.   Respiratory: Normal respiratory effort.  No retractions. Lungs CTAB. Gastrointestinal: Soft and nontender. No distention.  Musculoskeletal: No lower extremity tenderness nor edema. No gross deformities of extremities. Neurologic:  Normal speech and language. Moving all extremities equally. No apparent unilateral weakness/numbness.  Skin:  Skin is warm, dry and intact. No rash noted.  ____________________________________________   LABS (all labs ordered are listed, but only abnormal results are displayed)  Labs Reviewed  COMPREHENSIVE METABOLIC PANEL - Abnormal; Notable for the following components:      Result Value   Glucose, Bld 110 (*)    All other components within normal limits  LIPASE, BLOOD - Abnormal; Notable for the following components:   Lipase 53 (*)    All other components within normal limits  CBC WITH DIFFERENTIAL/PLATELET - Abnormal; Notable for the following components:  WBC 14.6 (*)    RBC 4.20 (*)    HCT 38.9 (*)    Platelets 113 (*)    Neutro Abs 13.0 (*)    Lymphs Abs 0.5 (*)    Abs Immature Granulocytes 0.11 (*)    All other components within normal limits  BLOOD GAS, ARTERIAL - Abnormal; Notable for the following components:   pH, Arterial 7.47 (*)    All other components within normal limits  CULTURE, BLOOD (ROUTINE X 2)  CULTURE,  BLOOD (ROUTINE X 2)  URINE CULTURE  SARS CORONAVIRUS 2 BY RT PCR  LACTIC ACID, PLASMA  LACTIC ACID, PLASMA  AMMONIA  TSH  URINALYSIS, ROUTINE W REFLEX MICROSCOPIC  PROTIME-INR  CORTISOL-AM, BLOOD  PROCALCITONIN  CBC  BASIC METABOLIC PANEL  TROPONIN I (HIGH SENSITIVITY)  TROPONIN I (HIGH SENSITIVITY)   ____________________________________________  EKG   EKG Interpretation  Date/Time:  Sunday February 23 2022 16:29:10 EDT Ventricular Rate:  113 PR Interval:  162 QRS Duration: 91 QT Interval:  343 QTC Calculation: 471 R Axis:   -56 Text Interpretation: Sinus tachycardia Ventricular premature complex Left anterior fascicular block Abnormal R-wave progression, late transition Borderline T wave abnormalities Confirmed by Nanda Quinton [phone]) on 02/23/2022 7:49:28 PM        ____________________________________________  RADIOLOGY  DG Chest Portable 1 View  Result Date: 02/23/2022 CLINICAL DATA:  Shortness of breath EXAM: PORTABLE CHEST 1 VIEW COMPARISON:  CT 02/05/2022, chest x-ray 02/11/2017 FINDINGS: Spinal rods and fixating screws in the cervical and thoracic spine. Partially visualized ascending thoracic stimulator leads at the lower thoracic spine. Mild heterogeneous airspace opacities, appears slightly improved as compared with chest CT from September 6. Cardiomegaly. Possible tiny pleural effusions. No visible pneumothorax IMPRESSION: 1. Cardiomegaly with possible tiny effusions 2. Residual heterogeneous bilateral airspace opacity corresponding to history of pneumonia, these appear slightly improved as compared to the chest CT from September 6. Electronically Signed   By: Donavan Foil M.D.   On: 02/23/2022 17:39    ____________________________________________   PROCEDURES  Procedure(s) performed:   Procedures  CRITICAL CARE Performed by: Margette Fast Total critical care time: 35 minutes Critical care time was exclusive of separately billable procedures and  treating other patients. Critical care was necessary to treat or prevent imminent or life-threatening deterioration. Critical care was time spent personally by me on the following activities: development of treatment plan with patient and/or surrogate as well as nursing, discussions with consultants, evaluation of patient's response to treatment, examination of patient, obtaining history from patient or surrogate, ordering and performing treatments and interventions, ordering and review of laboratory studies, ordering and review of radiographic studies, pulse oximetry and re-evaluation of patient's condition.  Nanda Quinton, MD Emergency Medicine  ____________________________________________   INITIAL IMPRESSION / ASSESSMENT AND PLAN / ED COURSE  Pertinent labs & imaging results that were available during my care of the patient were reviewed by me and considered in my medical decision making (see chart for details).   This patient is Presenting for Evaluation of AMS, which does require a range of treatment options, and is a complaint that involves a high risk of morbidity and mortality.  The Differential Diagnoses includes but is not exclusive to alcohol, illicit or prescription medications, intracranial pathology such as stroke, intracerebral hemorrhage, fever or infectious causes including sepsis, hypoxemia, uremia, trauma, endocrine related disorders such as diabetes, hypoglycemia, thyroid-related diseases, etc.   Critical Interventions-    Medications  metoprolol succinate (TOPROL-XL) 24 hr tablet 25 mg (has  no administration in time range)  ranolazine (RANEXA) 12 hr tablet 500 mg (has no administration in time range)  rosuvastatin (CRESTOR) tablet 5 mg (has no administration in time range)  pantoprazole (PROTONIX) EC tablet 40 mg (has no administration in time range)  psyllium (HYDROCIL/METAMUCIL) 1 packet (has no administration in time range)  metoCLOPramide (REGLAN) tablet 10 mg (has  no administration in time range)  Prucalopride Succinate TABS 2 mg (has no administration in time range)  rivaroxaban (XARELTO) tablet 20 mg (has no administration in time range)  calcium-vitamin D (OSCAL WITH D) 500-5 MG-MCG per tablet 1 tablet (has no administration in time range)  omega-3 acid ethyl esters (LOVAZA) capsule 1 g (has no administration in time range)  multivitamin with minerals tablet 1 tablet (has no administration in time range)  lactated ringers infusion (has no administration in time range)  ceFEPIme (MAXIPIME) 2 g in sodium chloride 0.9 % 100 mL IVPB (has no administration in time range)  metroNIDAZOLE (FLAGYL) IVPB 500 mg (has no administration in time range)  vancomycin (VANCOCIN) IVPB 1000 mg/200 mL premix (has no administration in time range)  hydrocortisone sodium succinate (SOLU-CORTEF) 100 MG injection 100 mg (has no administration in time range)  acetaminophen (TYLENOL) tablet 650 mg (has no administration in time range)    Or  acetaminophen (TYLENOL) suppository 650 mg (has no administration in time range)  ondansetron (ZOFRAN) tablet 4 mg (has no administration in time range)    Or  ondansetron (ZOFRAN) injection 4 mg (has no administration in time range)  sodium chloride 0.9 % bolus 1,000 mL (0 mLs Intravenous Stopped 02/23/22 1805)  cefTRIAXone (ROCEPHIN) 1 g in sodium chloride 0.9 % 100 mL IVPB (0 g Intravenous Stopped 02/23/22 1911)    Reassessment after intervention:  HR improved. Remains confused.    I did obtain Additional Historical Information from family at bedside.  I decided to review pertinent External Data, and in summary patient discharged on 9./15. Discharge summary and hospital course reviewed.    Clinical Laboratory Tests Ordered, included patient with leukocytosis to 14.6 which is new from his discharge labs.  Ammonia normal.  No acute kidney injury.  Lipase mildly elevated at 53.  Normal LFTs.  Troponin normal. Lactic acid normal.    Radiologic Tests Ordered, included CXR. I independently interpreted the images and agree with radiology interpretation.   Cardiac Monitor Tracing which shows tachycardia.    Social Determinants of Health Risk patient is a non-smoker.   Consult complete with Hospitalist. Plan for admit. Asking for ABG which they will follow.   Medical Decision Making: Summary:  Patient presents emergency department with altered mental status somewhat delirious presentation.  He does have a new oxygen requirement. Plan for screening labs and reassess.   Reevaluation with update and discussion with patient.  With new oxygen requirement, leukocytosis, altered mental status, continued infiltrates on chest x-ray will cover initially with Rocephin and broaden as needed.  I did call the [facility] transfer center as patient's family is requesting that he be admitted to [facility] if possible.  They do not have the capacity to accept this patient and declined my transfer request. Patient updated and understands.   Disposition: admit  ____________________________________________  FINAL CLINICAL IMPRESSION(S) / ED DIAGNOSES  Final diagnoses:  Acute respiratory failure with hypoxia ([place])  Community acquired pneumonia, unspecified laterality  Delirium    Note:  This document was prepared using Systems analyst and may include unintentional dictation errors.  Nanda Quinton, MD,  [facility] Emergency Medicine    Verenise Moulin, Wonda Old[facility], MD 02/23/22 [id number]

## 2022-02-23 NOTE — Assessment & Plan Note (Addendum)
As evidenced by low-grade fever with a Tmax of 99, tachycardia, tachypnea and leukocytosis. Lactic acid is within normal levels Unclear source of sepsis at this time, chest x-ray shows bilateral airspace opacity but patient was recently treated for multifocal pneumonia and completed antibiotic therapy. Place patient on stress dose steroids and hold oral hydrocortisone. Awaiting results of UA We will start patient empirically on vancomycin, cefepime and Flagyl Aggressive IV fluid resuscitation Follow-up results of blood and urine culture

## 2022-02-23 NOTE — ED Triage Notes (Signed)
[facility] reports pt coming from home for complaint of fall, also c/o weakness. Pt was recently in [facility] for 10 days for some type of infection.Pt denies any pain or discomfort.

## 2022-02-23 NOTE — H&P (Addendum)
History and Physical    Patient: [name] UXL:[id number] DOB: 1950-12-11 DOA: 02/23/2022 DOS: the patient was seen and examined on 02/23/2022 PCP: Redmond School, MD  Patient coming from: Home  Chief Complaint:  Chief Complaint  Patient presents with   Weakness   Most of the history was obtained from patient's wife and daughter at the bedside. HPI: [name] is a 71 y.o. male with medical history significant for Barrett's esophagus, gastroparesis, hypertension, coronary artery disease, ascending aortic aneurysm, splenic marginal zone lymphoma, DVT/PE on Xarelto, A-fib, recent COVID-19 viral infection, recent hospitalization for multifocal pneumonia concerning for possible aspiration, intractable nausea and vomiting as well as delirium (patient was discharged on 02/14/22). Patient's wife states that he was discharged about 10 days ago and was still slightly confused upon discharge and weak but has been stable at home until 2 days ago when he started becoming confused again.  She notes increased weakness and a fall on the day of admission.  She also notes that patient has frequency of urination as well as chills.  Patient's daughter notes that he is increasingly weak and has issues with his balance. They state that his appetite has been good and he has not had any further episodes of nausea and vomiting. In the ER he was noted to have room air pulse oximetry of 88% and is currently on 3 L of oxygen. Chest x-ray shows residual heterogeneous bilateral airspace opacity corresponding to history of pneumonia, these appear slightly improved as compared to the chest CT from September 6. UA still pending at the time of this H&P He received a dose of IV Rocephin and will be admitted to the hospital for further evaluation     Review of Systems: As mentioned in the history of present illness. All other systems reviewed and are negative. 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   Coronary artery disease    mild by 09/08/13 cath [facility]   GERD (gastroesophageal reflux disease)    barretts esophagus   H/O hiatal hernia    Headache(784.0)    Hypercholesteremia    Hypertension    PONV (postoperative nausea and vomiting)    trouble breathing after last surgery (at [facility])   Shingles    10   Sleep apnea    not tested   Tumor liver    no problems   Past Surgical History:  Procedure Laterality Date   ABDOMINAL SURGERY     x3   back surgeries     x 9   BACK SURGERY     x8   BIOPSY N/A 03/28/2013   Procedure: Distal Esophageal Biopsy;  Surgeon: Rogene Houston, MD;  Location: [facility];  Service: Endoscopy;  Laterality: N/A;   BIOPSY  05/30/2016   Procedure: BIOPSY;  Surgeon: Rogene Houston, MD;  [facility];  Service: Endoscopy;;   BIOPSY  02/19/2018   Procedure: BIOPSY;  Surgeon: Rogene Houston, MD;  [facility];  Service: Endoscopy;;  Barret's esophagus   BIOPSY  08/29/2020   Procedure: BIOPSY;  Surgeon: Rogene Houston, MD;  [facility];  Service: Endoscopy;;  BIOPSY  12/31/2021   Procedure: BIOPSY;  Surgeon: Montez Morita, Quillian Quince, MD;  [facility];  Service: Gastroenterology;;   CARDIAC CATHETERIZATION  2020   CHEST SURGERY     lung incisional hernia for hernia   COLONOSCOPY WITH PROPOFOL N/A 02/19/2018   Procedure: COLONOSCOPY WITH PROPOFOL;  Surgeon: Rogene Houston, MD;  [facility];  Service: Endoscopy;  Laterality: N/A;  1:00   CORONARY ANGIOPLASTY WITH STENT PLACEMENT  2020   2 blockages: 1 stent   ESOPHAGOGASTRODUODENOSCOPY (EGD) WITH PROPOFOL N/A 03/28/2013   Procedure: ESOPHAGOGASTRODUODENOSCOPY (EGD) WITH PROPOFOL;  Surgeon: Rogene Houston, MD;  Location: [facility];   Service: Endoscopy;  Laterality: N/A;  GE junction @ 38, proximal  margin '@37'$    ESOPHAGOGASTRODUODENOSCOPY (EGD) WITH PROPOFOL N/A 05/30/2016   Procedure: ESOPHAGOGASTRODUODENOSCOPY (EGD) WITH PROPOFOL;  Surgeon: Rogene Houston, MD;  [facility];  Service: Endoscopy;  Laterality: N/A;  955   ESOPHAGOGASTRODUODENOSCOPY (EGD) WITH PROPOFOL N/A 02/19/2018   Procedure: ESOPHAGOGASTRODUODENOSCOPY (EGD) WITH PROPOFOL;  Surgeon: Rogene Houston, MD;  [facility];  Service: Endoscopy;  Laterality: N/A;   ESOPHAGOGASTRODUODENOSCOPY (EGD) WITH PROPOFOL N/A 08/29/2020   Procedure: ESOPHAGOGASTRODUODENOSCOPY (EGD) WITH PROPOFOL;  Surgeon: Rogene Houston, MD;  [facility];  Service: Endoscopy;  Laterality: N/A;  AM   ESOPHAGOGASTRODUODENOSCOPY (EGD) WITH PROPOFOL N/A 12/31/2021   Procedure: ESOPHAGOGASTRODUODENOSCOPY (EGD) WITH PROPOFOL;  Surgeon: Harvel Quale, MD;  [facility];  Service: Gastroenterology;  Laterality: N/A;  100 ASA 2   HEMORRHOID SURGERY     x2   HERNIA REPAIR     HIATAL HERNIA REPAIR     JOINT REPLACEMENT     lft knee   KNEE ARTHROPLASTY     lft   KNEE ARTHROSCOPY Right    LEG SURGERY     x4 crushed    LITHOTRIPSY     MELANOMA EXCISION     lft abdomen   NECK SURGERY  2012   POLYPECTOMY  02/19/2018   Procedure: POLYPECTOMY;  Surgeon: Rogene Houston, MD;  [facility];  Service: Endoscopy;;  proximal transverse colon (CB x1), descending colon (CBx3)   POSTERIOR CERVICAL FUSION/FORAMINOTOMY  08/18/2011   Procedure: POSTERIOR CERVICAL FUSION/FORAMINOTOMY LEVEL 3;  Surgeon: Ophelia Charter, MD;  [facility];  Service: Neurosurgery;  Laterality: N/A;  Cervical three-four, four-five, five-six posterior cervical  fusion with instrumentation; Right cervical three-four, four-five laminotomy   REVISION TOTAL HIP ARTHROPLASTY Left    SHOULDER ARTHROSCOPY     rt bith defect , spur, rotator cuff   SPINAL CORD  STIMULATOR IMPLANT     rt hip   SPINAL CORD STIMULATOR INSERTION N/A 11/03/2016   Procedure: LUMBAR SPINAL CORD STIMULATOR REVISION;  Surgeon: Newman Pies, MD;  Location: [facility];  Service: Neurosurgery;  Laterality: N/A;   THYROID LOBECTOMY     rt   Social History:  reports that he quit smoking about 41 years ago. His smoking use included cigarettes. He has a 40.00 pack-year smoking history. He has never used smokeless tobacco. He reports that he does not currently use alcohol. He reports that he does not use drugs.  Allergies  Allergen Reactions   Demerol Other (See Comments)    "Makes me crazy."    Family History  Problem Relation Age of Onset   Heart disease Father    Aneurysm Mother    Aneurysm Sister    Heart disease Paternal Grandmother  Heart disease Paternal Grandfather    Heart disease Maternal Grandfather    Heart disease Brother        heart transplant   Aneurysm Other        pt states several members have aortic aneurysm   Skin cancer Other        several members   Lymphoma Brother    Aneurysm Maternal Grandmother     Prior to Admission medications   Medication Sig Start Date End Date Taking? Authorizing Provider  Bioflavonoid Products (ESTER-C) 500-550 MG TABS Take 1 tablet by mouth daily.    [provider]  Calcium Carbonate-Vitamin D 600-10 MG-MCG TABS Take 1 tablet by mouth 2 (two) times daily.     [provider]  dexlansoprazole (DEXILANT) 60 MG capsule Take 60 mg by mouth daily.     [provider]  diazepam (VALIUM) 10 MG tablet Take 10 mg by mouth 4 (four) times daily as needed (for back pain).    [provider]  fexofenadine (ALLEGRA) 180 MG tablet Take 180 mg by mouth every evening.    [provider]  fish oil-omega-3 fatty acids 1000 MG capsule Take 1 g by mouth 2 (two) times daily.    [provider]  hydrocortisone (CORTEF) 10 MG tablet Take 10-20 mg by mouth See admin instructions. Take 10  mg every evening and 20 mg every morning 12/04/20   [provider]  Metamucil Fiber CHEW Chew 1 each by mouth daily.    [provider]  metoCLOPramide (REGLAN) 10 MG tablet Take 1 tablet by mouth in the morning and at bedtime. 08/03/20   [provider]  metoprolol succinate (TOPROL-XL) 25 MG 24 hr tablet Take 1 tablet (25 mg total) by mouth daily. 01/27/22   Noemi Chapel, MD  montelukast (SINGULAIR) 10 MG tablet Take 10 mg by mouth daily.     [provider]  Multiple Vitamin (MULITIVITAMIN WITH MINERALS) TABS Take 1 tablet by mouth daily.    [provider]  ondansetron (ZOFRAN) 8 MG tablet Take 1 tablet (8 mg total) by mouth every 8 (eight) hours as needed for vomiting or nausea. 11/18/21   Harvel Quale, MD  oxyCODONE-acetaminophen (PERCOCET) 10-325 MG tablet Take 1 tablet by mouth every 6 (six) hours as needed for pain.    [provider]  Prucalopride Succinate (MOTEGRITY) 2 MG TABS Take 1 tablet (2 mg total) by mouth daily. 12/31/21   Harvel Quale, MD  ranolazine (RANEXA) 500 MG 12 hr tablet Take 500 mg by mouth 2 (two) times daily. 04/23/20   [provider]  rivaroxaban (XARELTO) 20 MG TABS tablet Take 1 tablet (20 mg total) by mouth daily with supper. 05/29/21   Manuella Ghazi, Pratik D, DO  rosuvastatin (CRESTOR) 10 MG tablet Take 5 mg by mouth every evening.    [provider]  sildenafil (VIAGRA) 100 MG tablet Take 50 mg by mouth as needed for erectile dysfunction. 02/10/20   [provider]    Physical Exam: Vitals:   02/23/22 1612 02/23/22 1700 02/23/22 1730 02/23/22 1830  BP:  123/89  138/77  Pulse:  (!) 113  90  Resp:  (!) 33  (!) 30  Temp:   99.7 F (37.6 C)   TempSrc:   Oral   SpO2:  92%  97%  Weight: 111.6 kg     Height: 6' (1.829 m)      Physical Exam Vitals and nursing note reviewed.  Constitutional:  Comments: Patient is oriented to person and place but not to time.   Needs frequent reorientation  HENT:     Head: Normocephalic and atraumatic.     Nose: Nose normal.     Mouth/Throat:     Mouth: Mucous membranes are dry.  Eyes:     Conjunctiva/sclera: Conjunctivae normal.  Cardiovascular:     Rate and Rhythm: Tachycardia present.  Pulmonary:     Comments: Tachypnea Abdominal:     General: Bowel sounds are normal.     Palpations: Abdomen is soft.  Musculoskeletal:        General: Normal range of motion.     Cervical back: Normal range of motion and neck supple.  Skin:    General: Skin is warm.  Neurological:     Motor: Weakness present.     Comments: Moves all extremities.  Oriented to person and place  Psychiatric:     Comments: Confused.  Unable to assess     Data Reviewed: Relevant notes from primary care and specialist visits, past discharge summaries as available in EHR, including Care Everywhere. Prior diagnostic testing as pertinent to current admission diagnoses Updated medications and problem lists for reconciliation ED course, including vitals, labs, imaging, treatment and response to treatment Triage notes, nursing and pharmacy notes and ED provider's notes Notable results as noted in HPI Labs reviewed.  Lactic acid 1.3, TSH 0.84, sodium 135, potassium 3.9, chloride 100, bicarb 23, glucose 110, BUN 17, creatinine 1.19, calcium 9.2, total protein 7.9, albumin 4.0, AST 24, ALT 17, alkaline phosphatase 60, total bilirubin 1.2, lipase 53, white count 14.6, hemoglobin 13.2, hematocrit 38.9, platelet count 113, ammonia level 13 Chest x-ray reviewed by me shows cardiomegaly with possible tiny effusions. Residual heterogeneous bilateral airspace opacity corresponding to history of pneumonia, these appear slightly improved as compared to the chest CT from September 6. Twelve-lead EKG reviewed by me shows sinus tachycardia, PVCs, nonspecific T wave changes There are no new results to review at this time.  Assessment and Plan: * Acute  encephalopathy Patient is oriented only to person and place which is not his baseline Acute encephalopathy appears to be secondary to delirium Unclear etiology for his delirium and most likely from an infectious process Hold benzodiazepines and opioids for now Monitor patient closely Place patient on fall precautions He would likely need a sitter if his wife leaves  Sepsis ([place]) As evidenced by low-grade fever with a Tmax of 99, tachycardia, tachypnea and leukocytosis. Lactic acid is within normal levels Unclear source of sepsis at this time, chest x-ray shows bilateral airspace opacity but patient was recently treated for multifocal pneumonia and completed antibiotic therapy. Place patient on stress dose steroids and hold oral hydrocortisone. Awaiting results of UA We will start patient empirically on vancomycin, cefepime and Flagyl Aggressive IV fluid resuscitation Follow-up results of blood and urine culture  A-fib ([facility]) Paroxysmal Continue metoprolol for rate control Continue Xarelto as primary prophylaxis for an acute stroke  Acute respiratory failure with hypoxia ([place]) Patient noted to have increased work of breathing, tachypnea with room air pulse oximetry of 88% and is currently on 3 L of oxygen to maintain pulse oximetry greater than 92%. Follow-up results of CT scan of the chest for further evaluation Will need assessment for home oxygen prior to discharge  GERD (gastroesophageal reflux disease) Continue Protonix      Advance Care Planning:   Code Status: Full Code   Consults: None  Family Communication: Greater than 50% of time  was spent discussing patient's condition and plan of care with his wife and daughter at the bedside.  All questions and concerns have been addressed.  They verbalized understanding and agree with the plan.  Severity of Illness: The appropriate patient status for this patient is INPATIENT. Inpatient status is judged to be reasonable and  necessary in order to provide the required intensity of service to ensure the patient's safety. The patient's presenting symptoms, physical exam findings, and initial radiographic and laboratory data in the context of their chronic comorbidities is felt to place them at high risk for further clinical deterioration. Furthermore, it is not anticipated that the patient will be medically stable for discharge from the hospital within 2 midnights of admission.   * I certify that at the point of admission it is my clinical judgment that the patient will require inpatient hospital care spanning beyond 2 midnights from the point of admission due to high intensity of service, high risk for further deterioration and high frequency of surveillance required.*  Author: Collier Bullock, MD 02/23/2022 8:23 PM  For on call review [email or web address].

## 2022-02-24 ENCOUNTER — Encounter (HOSPITAL_COMMUNITY): Payer: Self-pay | Admitting: Hematology and Oncology

## 2022-02-24 DIAGNOSIS — G9341 Metabolic encephalopathy: Secondary | ICD-10-CM

## 2022-02-24 LAB — PROCALCITONIN: Procalcitonin: 0.56 ng/mL

## 2022-02-24 LAB — CBC
HCT: 35 % — ABNORMAL LOW (ref 39.0–52.0)
Hemoglobin: 11.4 g/dL — ABNORMAL LOW (ref 13.0–17.0)
MCH: 31.2 pg (ref 26.0–34.0)
MCHC: 32.6 g/dL (ref 30.0–36.0)
MCV: 95.9 fL (ref 80.0–100.0)
Platelets: 87 10*3/uL — ABNORMAL LOW (ref 150–400)
RBC: 3.65 MIL/uL — ABNORMAL LOW (ref 4.22–5.81)
RDW: 14.8 % (ref 11.5–15.5)
WBC: 14.8 10*3/uL — ABNORMAL HIGH (ref 4.0–10.5)
nRBC: 0 % (ref 0.0–0.2)

## 2022-02-24 LAB — BASIC METABOLIC PANEL
Anion gap: 8 (ref 5–15)
BUN: 18 mg/dL (ref 8–23)
CO2: 23 mmol/L (ref 22–32)
Calcium: 8.8 mg/dL — ABNORMAL LOW (ref 8.9–10.3)
Chloride: 105 mmol/L (ref 98–111)
Creatinine, Ser: 1.13 mg/dL (ref 0.61–1.24)
GFR, Estimated: >60 mL/min (ref >60)
Glucose, Bld: 119 mg/dL — ABNORMAL HIGH (ref 70–99)
Potassium: 5 mmol/L (ref 3.5–5.1)
Sodium: 136 mmol/L (ref 135–145)

## 2022-02-24 LAB — PROTIME-INR
INR: 2.1 — ABNORMAL HIGH (ref 0.8–1.2)
Prothrombin Time: 23.6 seconds — ABNORMAL HIGH (ref 11.4–15.2)

## 2022-02-24 LAB — VITAMIN B12: Vitamin B-12: 865 pg/mL (ref 180–914)

## 2022-02-24 LAB — FOLATE: Folate: 17.9 ng/mL (ref >5.9)

## 2022-02-24 LAB — CK: Total CK: 23 U/L — ABNORMAL LOW (ref 49–397)

## 2022-02-24 LAB — CORTISOL-AM, BLOOD: Cortisol - AM: 22 ug/dL (ref 6.7–22.6)

## 2022-02-24 MED ORDER — NOREPINEPHRINE 4 MG/250ML-% IV SOLN: 0.0000 ug/min | INTRAVENOUS | Status: DC

## 2022-02-24 MED ORDER — SENNA 8.6 MG PO TABS
1.0000 | ORAL_TABLET | Freq: Every day | ORAL | Status: DC
  Administered 2022-02-24 – 2022-02-28 (×5): 8.6 mg via ORAL
  Filled 2022-02-24 (×5): qty 1

## 2022-02-24 MED ORDER — POLYETHYLENE GLYCOL 3350 17 G PO PACK
17.0000 g | PACK | Freq: Every day | ORAL | Status: DC
  Administered 2022-02-24 – 2022-02-28 (×5): 17 g via ORAL
  Filled 2022-02-24 (×5): qty 1

## 2022-02-24 MED ORDER — SODIUM CHLORIDE 0.9 % IV SOLN: 250.0000 mL | INTRAVENOUS | Status: DC

## 2022-02-24 MED ORDER — CHLORHEXIDINE GLUCONATE CLOTH 2 % EX PADS
6.0000 | MEDICATED_PAD | Freq: Every day | CUTANEOUS | Status: DC
  Administered 2022-02-24 – 2022-02-27 (×4): 6 via TOPICAL

## 2022-02-24 MED ORDER — NOREPINEPHRINE 4 MG/250ML-% IV SOLN
2.0000 ug/min | INTRAVENOUS | Status: DC
  Administered 2022-02-24: 2 ug/min via INTRAVENOUS
  Filled 2022-02-24: qty 250

## 2022-02-24 NOTE — Hospital Course (Addendum)
71 year old male with a history of paroxysmal atrial fibrillation, NASH, hypertension, hyperlipidemia, chronic intermittent nausea and vomiting attributable to gastroparesis status post Nissen fundoplication, Barrett's esophagus, ascending aortic aneurysm, DVT/PE on Xarelto since 05/2021, coronary artery disease status post PCI, melanoma stage III, and marginal zone lymphoma with pulmonary involvement presenting with generalized weakness, decreased oral intake, and worsening confusion. Notably, the patient had a recent hospitalization at [facility] from 02/05/2022 to 02/14/2022 when he initially presented for worsening nausea and vomiting and weight loss.  It was felt the patient had a flareup of his intermittent nausea and vomiting due to his gastroparesis.  His hospitalization was complicated by aspiration pneumonia.  The patient was treated with Unasyn and discharged home with oral amox/clav which he finished the course.  His hospitalization was prolonged secondary to hospital delirium and metabolic encephalopathy as well as new onset atrial fibrillation.  He was discharged home in stable condition with home health. According to the patient's spouse, the patient had some mild confusion at the time of discharge.  However his confusion had worsened 2 days after discharge home.  He has had increasing generalized weakness and decreased oral intake.  He had a fall on the day prior to this admission, but partly attributable to his impulsiveness.  No new meds or supplements since d/c.  Pt continued to have his usual "gagging" after eating.  Review of the medical record shows that the patient recently saw [facility], Dr. 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  Plan was continued observation.  The patient has completed treatment with 4 cycles of rituximab May 2022.  He has scheduled follow-up for 3 months. In the ED, the patient  had a fever up to 103.2 F with tachycardia.  He was hemodynamically stable.  Oxygen saturation was 97% 2 L.  Lactic acid peaked at 1.4.  UA showed >50WBC.  Chest x-ray showed improving bilateral infiltrates.  CT of the chest showed groundglass opacities in the perilobular and subpleural regions primarily peripherally.  There is GGO and consolidations.  Improved when compared to 02/05/2022.  There is chronic severe T4 compression fracture.  Blood cultures and urine cultures were obtained.  The patient was started on cefepime and vancomycin.

## 2022-02-24 NOTE — Progress Notes (Signed)
PROGRESS NOTE  ARLYNN MCDERMID IDP:[id number] DOB: 05-17-1951 DOA: 02/23/2022 PCP: Redmond School, MD  Brief History:  71 year old male with a history of paroxysmal atrial fibrillation, NASH, hypertension, hyperlipidemia, chronic intermittent nausea and vomiting attributable to gastroparesis status post Nissen fundoplication, Barrett's esophagus, ascending aortic aneurysm, DVT/PE on Xarelto since 05/2021, coronary artery disease status post PCI, melanoma stage III, and marginal zone lymphoma with pulmonary involvement presenting with generalized weakness, decreased oral intake, and worsening confusion. Notably, the patient had a recent hospitalization at [facility] from 02/05/2022 to 02/14/2022 when he initially presented for worsening nausea and vomiting and weight loss.  It was felt the patient had a flareup of his intermittent nausea and vomiting due to his gastroparesis.  His hospitalization was complicated by aspiration pneumonia.  The patient was treated with Unasyn and discharged home with oral amox/clav which he finished the course.  His hospitalization was prolonged secondary to hospital delirium and metabolic encephalopathy as well as new onset atrial fibrillation.  He was discharged home in stable condition with home health. According to the patient's spouse, the patient had some mild confusion at the time of discharge.  However his confusion had worsened 2 days after discharge home.  He has had increasing generalized weakness and decreased oral intake.  He had a fall on the day prior to this admission, but partly attributable to his impulsiveness.  No new meds or supplements since d/c.  Pt continued to have his usual "gagging" after eating.  Review of the medical record shows that the patient recently saw [facility], Dr. 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  Plan was continued observation.  The  patient has completed treatment with 4 cycles of rituximab May 2022.  He has scheduled follow-up for 3 months. In the ED, the patient had a fever up to 103.2 F with tachycardia.  He was hemodynamically stable.  Oxygen saturation was 97% 2 L.  Lactic acid peaked at 1.4.  UA showed >50WBC.  Chest x-ray showed improving bilateral infiltrates.  CT of the chest showed groundglass opacities in the perilobular and subpleural regions primarily peripherally.  There is GGO and consolidations.  Improved when compared to 02/05/2022.  There is chronic severe T4 compression fracture.  Blood cultures and urine cultures were obtained.  The patient was started on cefepime and vancomycin.     Assessment and Plan: Sepsis -Presented with fever, tachycardia, leukocytosis, and altered mental status -Lactic acid.  1.4>> 1.3 -Secondary to UTI although PNA and bacteremia cannot be ruled out presently -PCT 0.56 -Continue IV fluids -Vancomycin and cefepime -Follow blood and urine cultures -02/23/22 CT chest-GGO primary peripheral with minimal consolidation LLL, LUL, although these have improved compared to CT 02/05/2022  Acute respiratory failure with hypoxia -Presented with tachypnea and hypoxia with oxygen saturation 88% on room air -02/23/2022 ABG 7.4 7/32/87/23 on 4 L -Wean oxygen as tolerated -Certainly, the patient may have continued component of aspiration pneumonitis -Concern about possible component of BOOP -Low threshold for initiating steroids if no improvement or if clinical worsening -stable on 3L presently -wean to RA  Pulmonary infiltrates -Concern about possible component of BOOP -Low threshold for initiating steroids if no improvement or if clinical worsening -Speech therapy evaluation -Start dysphagia 3 diet pending speech eval -Patient to sit upright with oral care prior to eating -PCT 0.56 -Continue empiric vancomycin and cefepime for  now -MRSA screen  Pyuria -UA >50 WBC -Follow urine  culture  Acute metabolic encephalopathy -Secondary to infectious process -Further work-up if no significant improvement -TSH 0.841 -Ammonia 13 -Minimize hypnotic medications  Failure to thrive -Multifactorial including the patient's recent hospitalizations, deconditioning, complex medical issues and poor baseline functioning -TSH 1.75 -Z02 -Folic acid -PT evaluation  New onset A-fib, paroxysmal Diagnosed on 02/08/2022.  Currently in sinus  Rate controlled on metoprolol Continue home Xarelto  Follow-up with cardiology   Gastroparesis/inttermittent N/V History of Nissen fundoplication complicated by Barrett's esophagus -on Dexilant at home>>pantoprazole while in hospital -continue metoclopramide 10 mg tid   History CAD, ascending aortic aneurysm, hyperlipidemia. No chest pain. Troponins 8>>8 02/08/22 echo EF 55 to 60%, no WMA, trivial MR  Patient is on metoprolol, Ranexa, Xarelto and Crestor at home.  Continue home cardiac medications.   Essential hypertension BP stable. Continue metoprolol succinate   Chronic anxiety Continue PRN valium PDMP reviewed--receives intermittent diazepam '10mg'$ , last refill #90 01/01/22--single provider--[facility]   Chronic pain/Opioid dependence Percocet as needed PDMP reviewed--receives intermittent percocet 10/325, last refill #40 11/08/21--single provider--[facility]   History of marginal zone lymphoma, stage III melanoma Patient follows up with oncology at [facility], Dr. Caprice Beaver. Finished 4 cycles rituximab May 2022 Currently in remission as per the patient and had a recent follow-up. Continue hydrocortisone 20 g in a.m. and 10 mg in p.m from home.   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   Fatty liver 02/05/22 RUQ US--mild nodularity of the hepatic contour with preservation of the hepatic parenchymal echogenicity, nonspecific though could be seen in the setting of early cirrhotic change   Irritable bowel syndrome  constipation predominant.  Continue Senokot Metamucil Linzess      Family Communication:   spouse updated at bedside 9/25  Consultants:  none  Code Status:  FULL   DVT Prophylaxis:  xarelto   Procedures: As Listed in Progress Note Above  Antibiotics: Vanc 9/24>> Cefepime 9/24>>     Subjective: Patient denies fevers, chills, headache, chest pain, dyspnea, nausea, vomiting, diarrhea, abdominal pain, dysuria, hematuria, hematochezia, and melena.   Objective: Vitals:   02/23/22 2231 02/24/22 0500 02/24/22 0700 02/24/22 0714  BP: 132/67  100/62   Pulse: 82  73   Resp: (!) 27  (!) 23   Temp: (!) 100.7 F (38.2 C)   98.5 F (36.9 C)  TempSrc: Oral   Oral  SpO2: 96%  97%   Weight: 110.8 kg 110.8 kg    Height:        Intake/Output Summary (Last 24 hours) at 02/24/2022 0847 Last data filed at 02/24/2022 0452 Gross per 24 hour  Intake 874.48 ml  Output 900 ml  Net -25.52 ml   Weight change:  Exam:  General:  Pt is alert, follows commands appropriately, not in acute distress HEENT: No icterus, No thrush, No neck mass, [place]/AT Cardiovascular: RRR, S1/S2, no rubs, no gallops Respiratory: fine bibasilar rales. No wheeze Abdomen: Soft/+BS, non tender, non distended, no guarding Extremities: No edema, No lymphangitis, No petechiae, No rashes, no synovitis   Data Reviewed: I have personally reviewed following labs and imaging studies Basic Metabolic Panel: Recent Labs  Lab 02/23/22 1631 02/24/22 0449  NA 135 136  K 3.9 5.0  CL 100 105  CO2 23 23  GLUCOSE 110* 119*  BUN 17 18  CREATININE 1.19 1.13  CALCIUM 9.2 8.8*   Liver Function Tests: Recent Labs  Lab 02/23/22 1631  AST 24  ALT 17  ALKPHOS 68  BILITOT 1.2  PROT 7.9  ALBUMIN 4.0   Recent Labs  Lab 02/23/22 1631  LIPASE 53*   Recent Labs  Lab 02/23/22 1632  AMMONIA 13   Coagulation Profile: Recent Labs  Lab 02/24/22 0449  INR 2.1*   CBC: Recent Labs  Lab 02/23/22 1631  02/24/22 0449  WBC 14.6* 14.8*  NEUTROABS 13.0*  --   HGB 13.2 11.4*  HCT 38.9* 35.0*  MCV 92.6 95.9  PLT 113* 87*   Cardiac Enzymes: No results for input(s): "CKTOTAL", "CKMB", "CKMBINDEX", "TROPONINI" in the last 168 hours. BNP: Invalid input(s): "POCBNP" CBG: No results for input(s): "GLUCAP" in the last 168 hours. HbA1C: No results for input(s): "HGBA1C" in the last 72 hours. Urine analysis:    Component Value Date/Time   COLORURINE YELLOW 02/23/2022 1734   APPEARANCEUR HAZY (A) 02/23/2022 1734   LABSPEC 1.014 02/23/2022 1734   PHURINE 8.0 02/23/2022 1734   GLUCOSEU NEGATIVE 02/23/2022 1734   HGBUR MODERATE (A) 02/23/2022 1734   BILIRUBINUR NEGATIVE 02/23/2022 1734   KETONESUR 20 (A) 02/23/2022 1734   PROTEINUR 100 (A) 02/23/2022 1734   UROBILINOGEN 0.2 07/16/2012 1829   NITRITE NEGATIVE 02/23/2022 1734   LEUKOCYTESUR SMALL (A) 02/23/2022 1734   Sepsis Labs: '@LABRCNTIP'$ (procalcitonin:4,lacticidven:4) ) Recent Results (from the past 240 hour(s))  Culture, blood (routine x 2)     Status: None (Preliminary result)   Collection Time: 02/23/22  4:37 PM   Specimen: Left Antecubital; Blood  Result Value Ref Range Status   Specimen Description   Final    LEFT ANTECUBITAL BOTTLES DRAWN AEROBIC AND ANAEROBIC   Special Requests Blood Culture adequate volume  Final   Culture   Final    NO GROWTH < 12 HOURS Performed at [facility], [address], [place], [place] [address]    Report Status PENDING  Incomplete  Culture, blood (routine x 2)     Status: None (Preliminary result)   Collection Time: 02/23/22  5:14 PM   Specimen: BLOOD LEFT HAND  Result Value Ref Range Status   Specimen Description BLOOD LEFT HAND BOTTLES DRAWN AEROBIC ONLY  Final   Special Requests   Final    Blood Culture results may not be optimal due to an inadequate volume of blood received in culture bottles   Culture   Final    NO GROWTH < 12 HOURS Performed at [facility], [address],  [place], [place] [address]    Report Status PENDING  Incomplete  SARS Coronavirus 2 by RT PCR (hospital order, performed in [place] hospital lab) *cepheid single result test* Anterior Nasal Swab     Status: None   Collection Time: 02/23/22  7:29 PM   Specimen: Anterior Nasal Swab  Result Value Ref Range Status   SARS Coronavirus 2 by RT PCR NEGATIVE NEGATIVE Final    Comment: (NOTE) SARS-CoV-2 target nucleic acids are NOT DETECTED.  The SARS-CoV-2 RNA is generally detectable in upper and lower respiratory specimens during the acute phase of infection. The lowest concentration of SARS-CoV-2 viral copies this assay can detect is 250 copies / mL. A negative result does not preclude SARS-CoV-2 infection and should not be used as the sole basis for treatment or other patient management decisions.  A negative result may occur with improper specimen collection / handling, submission of specimen other than nasopharyngeal swab, presence of viral mutation(s) within the areas targeted by this assay, and inadequate number of viral copies (<250 copies /  mL). A negative result must be combined with clinical observations, patient history, and epidemiological information.  Fact Sheet for Patients:   https://www.patel.info/  Fact Sheet for Healthcare Providers: https://hall.com/  This test is not yet approved or  cleared by the [place] FDA and has been authorized for detection and/or diagnosis of SARS-CoV-2 by FDA under an Emergency Use Authorization (EUA).  This EUA will remain in effect (meaning this test can be used) for the duration of the COVID-19 declaration under Section 564(b)(1) of the Act, 21 U.S.C. section 360bbb-3(b)(1), unless the authorization is terminated or revoked sooner.  Performed at [facility], [address] [place], [place] [address]      Scheduled Meds:  acetaminophen  650 mg Rectal Once   calcium-vitamin D  1 tablet  Oral BID   Chlorhexidine Gluconate Cloth  6 each Topical Daily   hydrocortisone sod succinate (SOLU-CORTEF) inj  100 mg Intravenous Q12H   metoCLOPramide  10 mg Oral TID AC   metoprolol succinate  25 mg Oral Daily   multivitamin with minerals  1 tablet Oral Daily   omega-3 acid ethyl esters  1 g Oral BID   pantoprazole  40 mg Oral Daily   Prucalopride Succinate  1 each Oral Daily   psyllium  1 packet Oral Daily   ranolazine  500 mg Oral BID   rivaroxaban  20 mg Oral Q supper   rosuvastatin  5 mg Oral QPM   Continuous Infusions:  ceFEPime (MAXIPIME) IV 2 g (02/24/22 0411)   lactated ringers 125 mL/hr at 02/24/22 0705   metronidazole 500 mg (02/24/22 0756)    Procedures/Studies: CT CHEST WO CONTRAST  Result Date: 02/23/2022 CLINICAL DATA:  Pneumonia, complication suspected, xray done. Fall. Weakness. EXAM: CT CHEST WITHOUT CONTRAST TECHNIQUE: Multidetector CT imaging of the chest was performed following the standard protocol without IV contrast. RADIATION DOSE REDUCTION: This exam was performed according to the departmental dose-optimization program which includes automated exposure control, adjustment of the mA and/or kV according to patient size and/or use of iterative reconstruction technique. COMPARISON:  02/05/2022 CT chest, abdomen and pelvis. Chest radiograph from earlier today. FINDINGS: Substantially motion degraded scan, limiting assessment. Cardiovascular: Stable mild cardiomegaly. No significant pericardial effusion/thickening. Three-vessel coronary atherosclerosis. Atherosclerotic thoracic aorta with dilated 4.3 cm ascending thoracic aorta. Normal caliber pulmonary arteries. Mediastinum/Nodes: Hypodense 1.3 cm right thyroid nodule, unchanged. Not clinically significant; no follow-up imaging recommended (ref: J Am Coll Radiol. 2015 Feb;12(2): 143-50). Mildly patulous thoracic esophagus with fluid in the mid to lower thoracic esophagus. No pathologically enlarged axillary, mediastinal  or hilar lymph nodes, noting limited sensitivity for the detection of hilar adenopathy on this noncontrast study. Lungs/Pleura: No pneumothorax. No pleural effusion. Mild-to-moderate patchy peripheral predominantly perilobular and subpleural regions of ground-glass opacity, reticulation and minimal consolidation in both lungs with some associated parenchymal distortion, most prominent in left upper and left lower lobes. No significant regions of bronchiectasis. The ground-glass opacities and consolidation have improved since 02/05/2022 chest CT. No lung masses or significant pulmonary nodules in the aerated portions of the lungs. Upper abdomen: Small to moderate hiatal hernia. Partially visualize simple 3.9 cm upper left renal cyst, for which no follow-up imaging is recommended. Musculoskeletal: No aggressive appearing focal osseous lesions. Inferior approach spinal stimulator with lead terminating in the posterior midthoracic spinal canal. Partially visualized bilateral posterior spinal fusion hardware in the lower cervical spine. Bilateral posterior spinal fusion T2-T6 with chronic severe T4 vertebral compression fracture. Marked thoracic spondylosis. Partially visualized bilateral posterior spinal  fusion hardware in the upper lumbar spine. IMPRESSION: 1. Substantially motion degraded scan, limiting assessment. 2. Spectrum of findings compatible with improving mild-to-moderate multilobar organizing pneumonia compared to 02/05/2022 chest CT, with early evolving postinfectious scarring. 3. Stable mild cardiomegaly. Three-vessel coronary atherosclerosis. 4. Small to moderate hiatal hernia. Mildly patulous thoracic esophagus with fluid in the mid to lower thoracic esophagus, suggesting esophageal dysmotility and/or gastroesophageal reflux. 5. Dilated 4.3 cm ascending thoracic aorta. Recommend annual imaging followup by CTA or MRA. This recommendation follows 2010 ACCF/AHA/AATS/ACR/ASA/SCA/SCAI/SIR/STS/SVM Guidelines  for the Diagnosis and Management of Patients with Thoracic Aortic Disease. Circulation. 2010; 121: L976-B341. Aortic aneurysm NOS (ICD10-I71.9). 6. Chronic severe T4 vertebral compression fracture. 7. Aortic Atherosclerosis (ICD10-I70.0). Electronically Signed   By: Ilona Sorrel M.D.   On: 02/23/2022 20:00   DG Chest Portable 1 View  Result Date: 02/23/2022 CLINICAL DATA:  Shortness of breath EXAM: PORTABLE CHEST 1 VIEW COMPARISON:  CT 02/05/2022, chest x-ray 02/11/2017 FINDINGS: Spinal rods and fixating screws in the cervical and thoracic spine. Partially visualized ascending thoracic stimulator leads at the lower thoracic spine. Mild heterogeneous airspace opacities, appears slightly improved as compared with chest CT from September 6. Cardiomegaly. Possible tiny pleural effusions. No visible pneumothorax IMPRESSION: 1. Cardiomegaly with possible tiny effusions 2. Residual heterogeneous bilateral airspace opacity corresponding to history of pneumonia, these appear slightly improved as compared to the chest CT from September 6. Electronically Signed   By: Donavan Foil M.D.   On: 02/23/2022 17:39   CT HEAD WO CONTRAST (5MM)  Result Date: 02/11/2022 CLINICAL DATA:  Altered mental status. EXAM: CT HEAD WITHOUT CONTRAST TECHNIQUE: Contiguous axial images were obtained from the base of the skull through the vertex without intravenous contrast. RADIATION DOSE REDUCTION: This exam was performed according to the departmental dose-optimization program which includes automated exposure control, adjustment of the mA and/or kV according to patient size and/or use of iterative reconstruction technique. COMPARISON:  March 06, 2015 FINDINGS: Brain: There is mild cerebral atrophy with widening of the extra-axial spaces and ventricular dilatation. There are areas of decreased attenuation within the white matter tracts of the supratentorial brain, consistent with microvascular disease changes. Vascular: No hyperdense  vessel or unexpected calcification. Skull: Normal. Negative for fracture or focal lesion. Sinuses/Orbits: No acute finding. Other: None. IMPRESSION: Generalized cerebral atrophy and chronic white matter small vessel ischemic changes without evidence of an acute intracranial abnormality. Electronically Signed   By: Virgina Norfolk M.D.   On: 02/11/2022 20:08   IR US CHEST  Result Date: 02/11/2022 CLINICAL DATA:  Possible pleural effusion EXAM: CHEST ULTRASOUND COMPARISON:  Chest x-ray 02/10/2022 FINDINGS: Sonographic evaluation of the abdomen demonstrates no evidence of ascites or pleural effusion. Sonographic evaluation of the chest demonstrates no evidence of pleural effusion. IMPRESSION: Negative for pleural effusion and ascites. Electronically Signed   By: Jacqulynn Cadet M.D.   On: 02/11/2022 15:49   DG Chest Port 1 View  Result Date: 02/10/2022 CLINICAL DATA:  Dyspnea EXAM: PORTABLE CHEST 1 VIEW COMPARISON:  January 27, 2022, February 05, 2022 FINDINGS: Limited chest radiograph due to patient positioning and motion artifact. Partially visualized cervical and thoracic fusion hardware and spinal stimulator device. Widened mediastinal and cardiac contours, likely positional. Multifocal bilateral heterogeneous pulmonary opacities. Moderate left and small right pleural effusions. No large pneumothorax. No acute osseous abnormality. The visualized upper abdomen is unremarkable. IMPRESSION: 1. Markedly limited chest radiograph due to patient positioning and motion artifact. Within this limitation, there is multifocal bilateral heterogeneous pulmonary opacities, concerning for  multifocal infection. 2. Moderate left and small right pleural effusions. Electronically Signed   By: Beryle Flock M.D.   On: 02/10/2022 12:26   DG Abd Portable 1V  Result Date: 02/09/2022 CLINICAL DATA:  Abdominal pain EXAM: PORTABLE ABDOMEN - 1 VIEW COMPARISON:  Previous studies including the CT done on 02/12/22 FINDINGS:  Bowel gas pattern is nonspecific. There is residual contrast in the colon from previous studies. There is no significant small bowel dilation. Stomach is unremarkable. There is previous surgical fusion in lumbar spine. There is previous left hip arthroplasty. Pain control lead is seen in thoracic spinal canal. IMPRESSION: Nonspecific bowel gas pattern. Electronically Signed   By: Elmer Picker M.D.   On: 02/09/2022 12:32   ECHOCARDIOGRAM COMPLETE  Result Date: 02/08/2022    ECHOCARDIOGRAM REPORT   Patient Name:   DANNY YACKLEY [facility] Date of Exam: 02/08/2022 Medical Rec #:  [id number]      Height:       72.0 in Accession #:    [id number]     Weight:       246.9 lb Date of Birth:  11-10-1950      BSA:          2.330 m Patient Age:    79 years       BP:           90/60 mmHg Patient Gender: M              HR:           74 bpm. Exam Location:  Inpatient Procedure: 2D Echo Indications:    Atrial fibrillation  History:        Patient has prior history of Echocardiogram examinations, most                 recent 05/06/2021. Risk Factors:Hypertension and Sleep Apnea.  Sonographer:    Johny Chess RDCS Referring Phys: [id number] [place]  1. Left ventricular ejection fraction, by estimation, is 55 to 60%. The left ventricle has normal function. The left ventricle has no regional wall motion abnormalities. There is mild left ventricular hypertrophy. Left ventricular diastolic parameters are indeterminate.  2. Right ventricular systolic function is normal. The right ventricular size is normal. Tricuspid regurgitation signal is inadequate for assessing PA pressure.  3. The mitral valve is normal in structure. Trivial mitral valve regurgitation. No evidence of mitral stenosis.  4. The aortic valve is tricuspid. There is mild calcification of the aortic valve. There is mild thickening of the aortic valve. Aortic valve regurgitation is mild.  5. Aortic dilatation noted. There is moderate dilatation of the  aortic root, measuring 44 mm. There is moderate dilatation of the ascending aorta, measuring 43 mm.  6. The inferior vena cava is normal in size with greater than 50% respiratory variability, suggesting right atrial pressure of 3 mmHg. FINDINGS  Left Ventricle: Left ventricular ejection fraction, by estimation, is 55 to 60%. The left ventricle has normal function. The left ventricle has no regional wall motion abnormalities. The left ventricular internal cavity size was normal in size. There is  mild left ventricular hypertrophy. Left ventricular diastolic parameters are indeterminate. Right Ventricle: The right ventricular size is normal. Right vetricular wall thickness was not well visualized. Right ventricular systolic function is normal. Tricuspid regurgitation signal is inadequate for assessing PA pressure. Left Atrium: Left atrial size was normal in size. Right Atrium: Right atrial size was normal in size. Pericardium: There is no evidence of pericardial  effusion. Mitral Valve: The mitral valve is normal in structure. Trivial mitral valve regurgitation. No evidence of mitral valve stenosis. Tricuspid Valve: The tricuspid valve is not well visualized. Tricuspid valve regurgitation is trivial. No evidence of tricuspid stenosis. Aortic Valve: The aortic valve is tricuspid. There is mild calcification of the aortic valve. There is mild thickening of the aortic valve. There is mild aortic valve annular calcification. Aortic valve regurgitation is mild. Pulmonic Valve: The pulmonic valve was not well visualized. Pulmonic valve regurgitation is not visualized. No evidence of pulmonic stenosis. Aorta: Aortic dilatation noted. There is moderate dilatation of the aortic root, measuring 44 mm. There is moderate dilatation of the ascending aorta, measuring 43 mm. Venous: The inferior vena cava is normal in size with greater than 50% respiratory variability, suggesting right atrial pressure of 3 mmHg. IAS/Shunts: The  interatrial septum was not well visualized.  LEFT VENTRICLE PLAX 2D LVIDd:         5.40 cm   Diastology LVIDs:         4.00 cm   LV e' medial:    5.87 cm/s LV PW:         1.20 cm   LV E/e' medial:  11.0 LV IVS:        1.40 cm   LV e' lateral:   8.05 cm/s LVOT diam:     2.60 cm   LV E/e' lateral: 8.0 LV SV:         63 LV SV Index:   27 LVOT Area:     5.31 cm  RIGHT VENTRICLE             IVC RV S prime:     14.30 cm/s  IVC diam: 2.70 cm TAPSE (M-mode): 2.0 cm LEFT ATRIUM             Index        RIGHT ATRIUM           Index LA diam:        4.70 cm 2.02 cm/m   RA Area:     16.40 cm LA Vol (A2C):   57.9 ml 24.85 ml/m  RA Volume:   38.70 ml  16.61 ml/m LA Vol (A4C):   79.0 ml 33.90 ml/m LA Biplane Vol: 71.3 ml 30.60 ml/m  AORTIC VALVE LVOT Vmax:   63.90 cm/s LVOT Vmean:  41.900 cm/s LVOT VTI:    0.119 m  AORTA Ao Root diam: 4.40 cm Ao Asc diam:  4.30 cm MITRAL VALVE MV Area (PHT): 3.42 cm    SHUNTS MV Decel Time: 222 msec    Systemic VTI:  0.12 m MV E velocity: 64.50 cm/s  Systemic Diam: 2.60 cm MV A velocity: 61.30 cm/s MV E/A ratio:  1.05 Carlyle Dolly MD Electronically signed by Carlyle Dolly MD Signature Date/Time: 02/08/2022/4:55:02 PM    Final    US Abdomen Limited RUQ (LIVER/GB)  Result Date: 02/05/2022 CLINICAL DATA:  Right upper quadrant abdominal pain. History of cirrhosis. EXAM: ULTRASOUND ABDOMEN LIMITED RIGHT UPPER QUADRANT COMPARISON:  CT the chest, abdomen and pelvis-earlier same day FINDINGS: Gallbladder: Normal sonographic appearance of the gallbladder. No gallbladder wall thickening or pericholecystic stranding. No echogenic gallstones or biliary sludge. Negative sonographic Murphy's sign. Common bile duct: Diameter: Normal in size measuring 4 mm in diameter Liver: Normal echogenicity of the hepatic parenchyma however there is questioned mild nodularity of the hepatic contour (representative image 26). No discrete hepatic lesions. No intrahepatic biliary dilatation. Portal vein is patent  on  color Doppler imaging with normal direction of blood flow towards the liver. Other: None. IMPRESSION: Suspected mild nodularity of the hepatic contour with preservation of the hepatic parenchymal echogenicity, nonspecific though could be seen in the setting of early cirrhotic change. Correlation with LFTs is advised. Electronically Signed   By: Sandi Mariscal M.D.   On: 02/05/2022 15:20   CT CHEST ABDOMEN PELVIS W CONTRAST  Result Date: 02/05/2022 CLINICAL DATA:  Abdominal pain, weakness EXAM: CT CHEST, ABDOMEN, AND PELVIS WITH CONTRAST TECHNIQUE: Multidetector CT imaging of the chest, abdomen and pelvis was performed following the standard protocol during bolus administration of intravenous contrast. RADIATION DOSE REDUCTION: This exam was performed according to the departmental dose-optimization program which includes automated exposure control, adjustment of the mA and/or kV according to patient size and/or use of iterative reconstruction technique. CONTRAST:  172m OMNIPAQUE IOHEXOL 300 MG/ML  SOLN COMPARISON:  Previous studies including the CT abdomen and pelvis done on 01/27/2022 FINDINGS: CT CHEST FINDINGS Cardiovascular: There is homogeneous enhancement in thoracic aorta. 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 There are no intraluminal filling defects in central pulmonary artery branches. Peripheral pulmonary artery branches are not adequately visualized for evaluation. Coronary artery calcifications are seen. Mediastinum/Nodes: Unremarkable. Lungs/Pleura: Fairly extensive new patchy ground-glass infiltrates are noted in both lungs. There is minimal bilateral pleural effusions. There is no pneumothorax. Musculoskeletal: There is surgical fusion in cervical and upper thoracic spine. Pain control lead is seen in thoracic spinal canal. CT ABDOMEN PELVIS FINDINGS Hepatobiliary: There is fatty infiltration. Gallbladder is distended. There is no wall thickening. There is no  fluid around the gallbladder. There is no dilation of bile ducts. Pancreas: No focal abnormalities are seen. Spleen: Spleen is enlarged measuring 16 cm in maximum diameter. Adrenals/Urinary Tract: Adrenals are unremarkable. There is no hydronephrosis. There is 4.3 cm parapelvic cyst in the upper pole of left kidney. No follow-up imaging is recommended. There are no renal or ureteral stones. Urinary bladder is unremarkable. Stomach/Bowel: Small hiatal hernia is seen. There are surgical clips in the posterior mediastinum adjacent to the hiatal hernia. Small bowel loops are not dilated. Appendix is not dilated. Scattered diverticula are seen in colon without signs of focal acute diverticulitis. Vascular/Lymphatic: Arterial calcifications are seen. Reproductive: Unremarkable. Other: There is no ascites or pneumoperitoneum. There is previous ventral hernia repair. Umbilical hernia containing fat is seen. Bilateral inguinal hernias containing fat are seen, larger on the right side. Musculoskeletal: There is previous left hip arthroplasty. Lumbar surgical fusion is seen. Decrease in height of body of T4 vertebra has not changed. No recent fracture is seen. IMPRESSION: New extensive patchy infiltrates are seen in both lungs suggesting multifocal pneumonia. Minimal bilateral pleural effusions. Coronary artery disease. 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 Recommend annual imaging followup by CTA or MRA. This recommendation follows 2010 ACCF/AHA/AATS/ACR/ASA/SCA/SCAI/SIR/STS/SVM Guidelines for the Diagnosis and Management of Patients with Thoracic Aortic Disease. Circulation. 2010; 121:: [id number] Aortic aneurysm NOS (ICD10-I71.9) There is no evidence of intestinal obstruction or pneumoperitoneum. Appendix is not dilated. There is no hydronephrosis. Fatty liver. Small hiatal hernia. Splenomegaly. There is parapelvic cyst in the left kidney. Diverticulosis of colon without signs of focal  diverticulitis. Other findings as described in the body of the report. Electronically Signed   By: PElmer PickerM.D.   On: 02/05/2022 13:26   CT ABDOMEN PELVIS W CONTRAST  Result Date: 01/27/2022 CLINICAL DATA:  Generalized abdominal pain, fell at 0430 hours this morning  going to the bathroom, weakness, RIGHT rib pain EXAM: CT ABDOMEN AND PELVIS WITH CONTRAST TECHNIQUE: Multidetector CT imaging of the abdomen and pelvis was performed using the standard protocol following bolus administration of intravenous contrast. RADIATION DOSE REDUCTION: This exam was performed according to the departmental dose-optimization program which includes automated exposure control, adjustment of the mA and/or kV according to patient size and/or use of iterative reconstruction technique. CONTRAST:  115m OMNIPAQUE IOHEXOL 300 MG/ML SOLN IV. No oral contrast. COMPARISON:  PET-CT 06/05/2020 FINDINGS: Lower chest: Minimal LEFT basilar atelectasis. Hepatobiliary: Gallbladder and liver normal appearance Pancreas: Multiple calcifications within or adjacent to uncinate process of pancreas without soft tissue mass or cyst. Otherwise normal appearance. Spleen: Normal appearance Adrenals/Urinary Tract: Adrenal glands normal appearance. Peripelvic cyst LEFT kidney 4.3 x 3.1 cm image 31 unchanged, simple features by CT; no follow-up imaging recommended. Small nonobstructing renal calculi. No hydronephrosis or hydroureter. Bladder unremarkable. Stomach/Bowel: Descending and sigmoid diverticulosis without evidence of diverticulitis. Normal appendix. Stomach and remaining bowel loops unremarkable. Vascular/Lymphatic: Atherosclerotic calcifications aorta, iliac arteries, coronary arteries. Aorta normal caliber. No adenopathy. Reproductive: Minimal prostatic enlargement. Seminal vesicles unremarkable. Other: BILATERAL hernias containing fat.  No free air or free fluid. Musculoskeletal: LEFT hip prosthesis. Osseous demineralization. Prior  lumbosacral fusion L1-S1. Intraspinal stimulator. No acute fractures. IMPRESSION: BILATERAL hernias containing fat. Distal colonic diverticulosis without evidence of diverticulitis. Small nonobstructing renal calculi. No acute intra-abdominal or intrapelvic abnormalities. Aortic Atherosclerosis (ICD10-I70.0). Electronically Signed   By: MLavonia DanaM.D.   On: 01/27/2022 13:46   DG Ribs Unilateral W/Chest Right  Result Date: 01/27/2022 CLINICAL DATA:  Fall with right rib pain EXAM: RIGHT RIBS AND CHEST - 3+ VIEW COMPARISON:  None Available. FINDINGS: No fracture or other bone lesions are seen involving the ribs. There is no evidence of pneumothorax or pleural effusion. Both lungs are clear. Heart size and mediastinal contours are within normal limits. IMPRESSION: Negative. Electronically Signed   By: MNelson ChimesM.D.   On: 01/27/2022 11:44   DG Shoulder Right  Result Date: 01/27/2022 CLINICAL DATA:  FGolden Circletoday with shoulder pain EXAM: RIGHT SHOULDER - 2+ VIEW COMPARISON:  None Available. FINDINGS: Glenohumeral joint is normal. No evidence of regional fracture. Mild widening of the [facility] which could be chronic or indicate a minor AC joint injury. IMPRESSION: No fracture or dislocation. Question mild widening of the [facility] which could be chronic or could indicate a ligamentous injury. Electronically Signed   By: MNelson ChimesM.D.   On: 01/27/2022 11:43    DOrson Eva DO  [facility]  If 7PM-7AM, please contact night-coverage [email or web address] Password [facility]9/25/2023, 8:47 AM   LOS: 1 day

## 2022-02-24 NOTE — Evaluation (Signed)
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   Coronary artery disease    mild by 09/08/13 cath [facility]   GERD (gastroesophageal reflux disease)    barretts esophagus   H/O hiatal hernia    Headache(784.0)    Hypercholesteremia    Hypertension    PONV (postoperative nausea and vomiting)    trouble breathing after last surgery (at [facility])   Shingles    10   Sleep apnea    not tested   Tumor liver    no problems   Past Surgical History:  Past Surgical History:  Procedure Laterality Date   ABDOMINAL SURGERY     x3   back surgeries     x 9   BACK SURGERY     x8   BIOPSY N/A 03/28/2013   Procedure: Distal Esophageal Biopsy;  Surgeon: Rogene Houston, MD;  [facility]: [facility];  Service: Endoscopy;  Laterality: N/A;   BIOPSY  05/30/2016   Procedure: BIOPSY;  Surgeon: Rogene Houston, MD;  [facility];  Service: Endoscopy;;   BIOPSY  02/19/2018   Procedure: BIOPSY;  Surgeon: Rogene Houston, MD;  [facility];  Service: Endoscopy;;  Barret's esophagus   BIOPSY  08/29/2020   Procedure: BIOPSY;  Surgeon: Rogene Houston, MD;  [facility];  Service: Endoscopy;;   BIOPSY  12/31/2021   Procedure: BIOPSY;  Surgeon: Harvel Quale, MD;  [facility];  Service: Gastroenterology;;   CARDIAC CATHETERIZATION  2020   CHEST SURGERY     lung incisional hernia for hernia   COLONOSCOPY WITH PROPOFOL N/A 02/19/2018   Procedure: COLONOSCOPY  WITH PROPOFOL;  Surgeon: Rogene Houston, MD;  [facility];  Service: Endoscopy;  Laterality: N/A;  1:00   CORONARY ANGIOPLASTY WITH STENT PLACEMENT  2020   2 blockages: 1 stent   ESOPHAGOGASTRODUODENOSCOPY (EGD) WITH PROPOFOL N/A 03/28/2013   Procedure: ESOPHAGOGASTRODUODENOSCOPY (EGD) WITH PROPOFOL;  Surgeon: Rogene Houston, MD;  [facility]: [facility];  Service: Endoscopy;  Laterality: N/A;  GE junction @ 38, proximal  margin '@37'$    ESOPHAGOGASTRODUODENOSCOPY (EGD) WITH PROPOFOL N/A 05/30/2016   Procedure: ESOPHAGOGASTRODUODENOSCOPY (EGD) WITH PROPOFOL;  Surgeon: Rogene Houston, MD;  [facility];  Service: Endoscopy;  Laterality: N/A;  955   ESOPHAGOGASTRODUODENOSCOPY (EGD) WITH PROPOFOL N/A 02/19/2018   Procedure: ESOPHAGOGASTRODUODENOSCOPY (EGD) WITH PROPOFOL;  Surgeon: Rogene Houston, MD;  [facility];  Service: Endoscopy;  Laterality: N/A;   ESOPHAGOGASTRODUODENOSCOPY (EGD) WITH PROPOFOL N/A 08/29/2020   Procedure: ESOPHAGOGASTRODUODENOSCOPY (EGD) WITH PROPOFOL;  Surgeon: Rogene Houston, MD;  [facility];  Service: Endoscopy;  Laterality: N/A;  AM   ESOPHAGOGASTRODUODENOSCOPY (EGD) WITH PROPOFOL N/A 12/31/2021   Procedure: ESOPHAGOGASTRODUODENOSCOPY (EGD) WITH PROPOFOL;  Surgeon: Harvel Quale, MD;  [facility];  Service: Gastroenterology;  Laterality: N/A;  100 ASA 2  HEMORRHOID SURGERY     x2   HERNIA REPAIR     HIATAL HERNIA REPAIR     JOINT REPLACEMENT     lft knee   KNEE ARTHROPLASTY     lft   KNEE ARTHROSCOPY Right    LEG SURGERY     x4 crushed    LITHOTRIPSY     MELANOMA EXCISION     lft abdomen   NECK SURGERY  2012   POLYPECTOMY  02/19/2018   Procedure: POLYPECTOMY;  Surgeon: Rogene Houston, MD;  [facility];  Service: Endoscopy;;  proximal transverse colon (CB x1), descending colon (CBx3)   POSTERIOR CERVICAL FUSION/FORAMINOTOMY  08/18/2011   Procedure: POSTERIOR CERVICAL  FUSION/FORAMINOTOMY LEVEL 3;  Surgeon: Ophelia Charter, MD;  Location: [place] [facility];  Service: Neurosurgery;  Laterality: N/A;  Cervical three-four, four-five, five-six posterior cervical  fusion with instrumentation; Right cervical three-four, four-five laminotomy   REVISION TOTAL HIP ARTHROPLASTY Left    SHOULDER ARTHROSCOPY     rt bith defect , spur, rotator cuff   SPINAL CORD STIMULATOR IMPLANT     rt hip   SPINAL CORD STIMULATOR INSERTION N/A 11/03/2016   Procedure: LUMBAR SPINAL CORD STIMULATOR REVISION;  Surgeon: Newman Pies, MD;  Location: [place];  Service: Neurosurgery;  Laterality: N/A;   THYROID LOBECTOMY     rt   HPI:  71 year old male with a history of paroxysmal atrial fibrillation, NASH, hypertension, hyperlipidemia, chronic intermittent nausea and vomiting attributable to gastroparesis status post Nissen fundoplication, Barrett's esophagus, ascending aortic aneurysm, DVT/PE on Xarelto since 05/2021, coronary artery disease status post PCI, melanoma stage III, and marginal zone lymphoma with pulmonary involvement presenting with generalized weakness, decreased oral intake, and worsening confusion.  Notably, the patient had a recent hospitalization at [facility] from 02/05/2022 to 02/14/2022 when he initially presented for worsening nausea and vomiting and weight loss.  It was felt the patient had a flareup of his intermittent nausea and vomiting due to his gastroparesis.  His hospitalization was complicated by aspiration pneumonia.  The patient was treated with Unasyn and discharged home with oral amox/clav which he finished the course.  His hospitalization was prolonged secondary to hospital delirium and metabolic encephalopathy as well as new onset atrial fibrillation.  He was discharged home in stable condition with home health.  According to the patient's spouse, the patient had some mild confusion at the time of discharge.  However his confusion had worsened 2 days after discharge  home.  He has had increasing generalized weakness and decreased oral intake.  He had a fall on the day prior to this admission, but partly attributable to his impulsiveness.  No new meds or supplements since d/c.  Pt continued to have his usual "gagging" after eating.Chest x-ray showed improving bilateral infiltrates.  CT of the chest showed groundglass opacities in the perilobular and subpleural regions primarily peripherally.  There is GGO and consolidations.  Improved when compared to 02/05/2022.  There is chronic severe T4 compression fracture.  Blood cultures and urine cultures were obtained.  The patient was started on cefepime and vancomycin. 02/23/22 CT chest-GGO primary peripheral with minimal consolidation LLL, LUL, although these have improved compared to CT 02/05/2022. Pt had MBSS at [facility] earlier this month and oropharyngeal swallow was grossly WNL. BSE requested this admission.   MBSS September 2023: <<Pt participated in [facility]. Unfortunately, after the exam, it was revealed that fluoroscopic images did not record due to equipment malfunction.  Pt's oral phase  was WNL. There was no aspiration, only occasional high penetration of thin liquids (Penetration/aspiration scale score of 3 - trace amounts of penetrant lined inferior epiglottis, did not reach vocal folds).  Pt swallowed a 78m pill without incident; no aspiration. There was presence of prevertebral vs. posterior pharyngeal wall tissue at the level of the larynx that was viewed in real time but could not be identified after the study given absence of image recording. Overall, there was good mobility of the hyolaryngeal complex, adequate transition of POs through UES, and no noted aspiration on today's exam. Doubt prandial aspiration as contributor to current condition. Recommend advancing diet per GI.>>  Gastric Emptying Study 2020: <<Findings represent markedly delayed gastric emptying. Calculated emptying of 9% is unchanged since the prior  study of 06/05/2016.>>    Assessment / Plan / Recommendation  Clinical Impression  Clinical swallow evaluation completed in room with breakfast meal. Oral motor examination is WNL. [facility] and oropharyngeal swallow noted to be grossly WNL. Pt reports a long standing history of GERD. He had a gastric emptying study in 2020 which showed delayed emptying of 9%. He also reports h/o Nissen Fundoplication, but unsure of date. Pt consumed regular textures and thin liquids without incident today while seated upright in chair. Pt indicates that at home, he often eats and then reclines, which increases his risk for post prandial aspiration especially in setting of known delayed gastric emptying and GERD. Pt and spouse provided esophageal swallow precautions and strict recommendation for remaining upright for at least 30 minutes post meals and to elevate head of bed at night. He generally sleep flat on the bed or couch. Recommend regular textures and thin liquids with implementation of reflux precautions. Results of MBSS are above. No further SLP services indicated at this time.   SLP Visit Diagnosis: Dysphagia, unspecified (R13.10)    Aspiration Risk  No limitations    Diet Recommendation Regular;Thin liquid   Liquid Administration via: Cup;Straw Medication Administration: Whole meds with liquid Supervision: Patient able to self feed Postural Changes: Seated upright at 90 degrees;Remain upright for at least 30 minutes after po intake    Other  Recommendations Oral Care Recommendations: Oral care BID Other Recommendations: Clarify dietary restrictions    Recommendations for follow up therapy are one component of a multi-disciplinary discharge planning process, led by the attending physician.  Recommendations may be updated based on patient status, additional functional criteria and insurance  authorization.  Follow up Recommendations No SLP follow up      Assistance Recommended at Discharge None  Functional Status Assessment Patient has not had a recent decline in their functional status  Frequency and Duration            Prognosis        Swallow Study   General Date of Onset: 02/23/22 HPI: 71year old male with a history of paroxysmal atrial fibrillation, NASH, hypertension, hyperlipidemia, chronic intermittent nausea and vomiting attributable to gastroparesis status post Nissen fundoplication, Barrett's esophagus, ascending aortic aneurysm, DVT/PE on Xarelto since 05/2021, coronary artery disease status post PCI, melanoma stage III, and marginal zone lymphoma with pulmonary involvement presenting with generalized weakness, decreased oral intake, and worsening confusion.  Notably[facility] when he initially presented for worsening nausea and vomiting and weight loss.  It was felt the patient had a flareup of his intermittent nausea  and vomiting due to his gastroparesis.  His hospitalization was complicated by aspiration pneumonia.  The patient was treated with Unasyn and discharged home with oral amox/clav which he finished the course.  His hospitalization was prolonged secondary to hospital delirium and metabolic encephalopathy as well as new onset atrial fibrillation.  He was discharged home in stable condition with home health.  According to the patient's spouse, the patient had some mild confusion at the time of discharge.  However his confusion had worsened 2 days after discharge home.  He has had increasing generalized weakness and decreased oral intake.  He had a fall on the day prior to this admission, but partly attributable to his impulsiveness.  No new meds or supplements since d/c.  Pt continued to have his usual "gagging" after eating.Chest x-ray showed improving bilateral infiltrates.  CT of the  chest showed groundglass opacities in the perilobular and subpleural regions primarily peripherally.  There is GGO and consolidations.  Improved when compared to 02/05/2022.  There is chronic severe T4 compression fracture.  Blood cultures and urine cultures were obtained.  The patient was started on cefepime and vancomycin. 02/23/22 CT chest-GGO primary peripheral with minimal consolidation LLL, LUL, although these have improved compared to CT 02/05/2022. Pt had MBSS at [facility] earlier this month and oropharyngeal swallow was grossly WNL. BSE requested this admission. Type of Study: Bedside Swallow Evaluation Previous Swallow Assessment: MBSS Diet Prior to this Study: Dysphagia 3 (soft);Thin liquids Temperature Spikes Noted: No Respiratory Status: Nasal cannula History of Recent Intubation: No Behavior/Cognition: Alert;Cooperative Oral Cavity Assessment: Within Functional Limits Oral Care Completed by SLP: Recent completion by staff Oral Cavity - Dentition: Adequate natural dentition Vision: Functional for self-feeding Self-Feeding Abilities: Able to feed self Patient Positioning: Upright in chair Baseline Vocal Quality: Normal Volitional Cough: Strong Volitional Swallow: Able to elicit    Oral/Motor/Sensory Function Overall Oral Motor/Sensory Function: Within functional limits   Ice Chips Ice chips: Not tested   Thin Liquid Thin Liquid: Within functional limits Presentation: Cup;Straw;Self Fed    Nectar Thick Nectar Thick Liquid: Not tested   Honey Thick Honey Thick Liquid: Not tested   Puree Puree: Within functional limits Presentation: Spoon   Solid     Solid: Within functional limits Presentation: Self Fed     Thank you,  Genene Churn, Wink  Greenleigh Kauth 02/24/2022,1:59 PM

## 2022-02-24 NOTE — Progress Notes (Signed)
Dr Tat has been made aware of patient's blood pressures and new orders placed and started.

## 2022-02-24 NOTE — TOC Initial Note (Signed)
Transition of Care [facility]) - Initial/Assessment Note    Patient Details  Name: [name] MRN: [id number] Date of Birth: 22-Feb-1951  Transition [facility] Care [facility]) CM/SW Contact:    Shade Flood, LCSW Phone Number: 02/24/2022, 2:03 PM  Clinical Narrative:                  Pt admitted from home. He lives with his wife and is active with [facility] for PT/OT in the home. Anticipating pt will return home with HH at dc. TOC will follow and continue to assess and assist with dc planning.  Expected Discharge Plan: [place] Barriers to Discharge: Continued Medical Work up   Patient Goals and CMS Choice Patient states their goals for this hospitalization and ongoing recovery are:: return home      Expected Discharge Plan and Services Expected Discharge Plan: [place] In-house Referral: Clinical Social Work   Post Acute Care Choice: Resumption of Svcs/PTA Provider Living arrangements for the past 2 months: Single Family Home                                      Prior Living Arrangements/Services Living arrangements for the past 2 months: Single Family Home Lives with:: Spouse Patient language and need for interpreter reviewed:: Yes Do you feel safe going back to the place where you live?: Yes      Need for Family Participation in Patient Care: Yes (Comment) Care giver support system in place?: Yes (comment) Current home services: DME, Home PT, Home OT Criminal Activity/Legal Involvement Pertinent to Current Situation/Hospitalization: No - Comment as needed  Activities of Daily Living Home Assistive Devices/Equipment: Bedside commode/3-in-1, Eyeglasses, Cane (specify quad or straight), Walker (specify type), Wheelchair, BIPAP ADL Screening (condition at time of admission) Patient's cognitive ability adequate to safely complete daily activities?: Yes Is the patient deaf or have difficulty hearing?: No Does the patient have difficulty seeing,  even when wearing glasses/contacts?: No Does the patient have difficulty concentrating, remembering, or making decisions?: Yes Patient able to express need for assistance with ADLs?: Yes Does the patient have difficulty dressing or bathing?: No Independently performs ADLs?: Yes (appropriate for developmental age) Does the patient have difficulty walking or climbing stairs?: No Weakness of Legs: Both Weakness of Arms/Hands: Both  Permission Sought/Granted                  Emotional Assessment       Orientation: : Oriented to Self, Oriented to Place, Oriented to Situation, Oriented to  Time Alcohol / Substance Use: Not Applicable Psych Involvement: No (comment)  Admission diagnosis:  Delirium [R41.0] Acute respiratory failure with hypoxia ([place]) [J96.01] Acute encephalopathy [G93.40] Sepsis ([place]) [A41.9] Community acquired pneumonia, unspecified laterality [J18.9] Patient Active Problem List   Diagnosis Date Noted   Acute metabolic encephalopathy 89/38/1017   Acute encephalopathy 02/23/2022   Sepsis due to undetermined organism ([place]) 02/23/2022   Volume depletion 02/12/2022   Acute respiratory failure with hypoxia ([place]) 02/12/2022   Paroxysmal atrial fibrillation ([place]) 02/12/2022   Multifocal pneumonia    GI bleeding 02/05/2022   IBS (irritable bowel syndrome) 11/18/2021   Acute pulmonary embolism ([place]) 05/05/2021   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   Fatty liver 08/14/2020   Acute ITP ([place]) 05/09/2020   Constipation 05/24/2019   Pancreatic calcification 05/24/2019   Gastroparesis 01/04/2019   N&V (nausea  and vomiting) 11/17/2018   Debility 11/17/2018   Special screening for malignant neoplasms, colon 01/26/2018   Barrett's esophagus without dysplasia 01/26/2018   Thrombocytopenia ([place]) 04/02/2017   Lumbar stenosis with neurogenic claudication 11/03/2016   Intractable vomiting with nausea 05/29/2016   OSA (obstructive sleep apnea) 11/18/2013    Liver lesion 03/01/2013   Barrett's esophagus 03/01/2013   GERD (gastroesophageal reflux disease) 03/01/2013   Essential hypertension, benign 03/01/2013   S/P lumbar fusion 03/25/2012   Back pain without radiation 03/25/2012   PCP:  Redmond School, MD Pharmacy:   [facility], [place] [address] [place] [address] Phone: [phone] Fax: [place], [place] [address] Phone: [phone] Fax: [place], [place] - [place] [address] [place] [address] Phone: [phone] Fax: [phone]     Social Determinants of Health (SDOH) Interventions    Readmission Risk Interventions    02/24/2022    2:01 PM  Readmission Risk Prevention Plan  Transportation Screening Complete  HRI or [place] Complete  Social Work Consult for [place] Planning/Counseling Complete  Palliative Care Screening Not Applicable  Medication Review Press [employer]) Complete

## 2022-02-25 DIAGNOSIS — I480 Paroxysmal atrial fibrillation: Secondary | ICD-10-CM

## 2022-02-25 DIAGNOSIS — J9601 Acute respiratory failure with hypoxia: Secondary | ICD-10-CM

## 2022-02-25 DIAGNOSIS — R410 Disorientation, unspecified: Secondary | ICD-10-CM | POA: Diagnosis not present

## 2022-02-25 DIAGNOSIS — K3184 Gastroparesis: Secondary | ICD-10-CM

## 2022-02-25 DIAGNOSIS — D696 Thrombocytopenia, unspecified: Secondary | ICD-10-CM

## 2022-02-25 DIAGNOSIS — A419 Sepsis, unspecified organism: Secondary | ICD-10-CM

## 2022-02-25 DIAGNOSIS — G9341 Metabolic encephalopathy: Secondary | ICD-10-CM | POA: Diagnosis not present

## 2022-02-25 LAB — CBC
HCT: 30.5 % — ABNORMAL LOW (ref 39.0–52.0)
Hemoglobin: 9.9 g/dL — ABNORMAL LOW (ref 13.0–17.0)
MCH: 30.7 pg (ref 26.0–34.0)
MCHC: 32.5 g/dL (ref 30.0–36.0)
MCV: 94.7 fL (ref 80.0–100.0)
Platelets: 110 10*3/uL — ABNORMAL LOW (ref 150–400)
RBC: 3.22 MIL/uL — ABNORMAL LOW (ref 4.22–5.81)
RDW: 14.6 % (ref 11.5–15.5)
WBC: 13.3 10*3/uL — ABNORMAL HIGH (ref 4.0–10.5)
nRBC: 0 % (ref 0.0–0.2)

## 2022-02-25 LAB — BASIC METABOLIC PANEL
Anion gap: 5 (ref 5–15)
BUN: 23 mg/dL (ref 8–23)
CO2: 23 mmol/L (ref 22–32)
Calcium: 8.4 mg/dL — ABNORMAL LOW (ref 8.9–10.3)
Chloride: 110 mmol/L (ref 98–111)
Creatinine, Ser: 1.05 mg/dL (ref 0.61–1.24)
GFR, Estimated: >60 mL/min (ref >60)
Glucose, Bld: 150 mg/dL — ABNORMAL HIGH (ref 70–99)
Potassium: 4.5 mmol/L (ref 3.5–5.1)
Sodium: 138 mmol/L (ref 135–145)

## 2022-02-25 LAB — MAGNESIUM: Magnesium: 2 mg/dL (ref 1.7–2.4)

## 2022-02-25 LAB — PROCALCITONIN: Procalcitonin: 0.61 ng/mL

## 2022-02-25 LAB — URINE CULTURE: Culture: 20000 — AB

## 2022-02-25 LAB — MRSA NEXT GEN BY PCR, NASAL: MRSA by PCR Next Gen: NOT DETECTED

## 2022-02-25 LAB — GLUCOSE, CAPILLARY: Glucose-Capillary: 151 mg/dL — ABNORMAL HIGH (ref 70–99)

## 2022-02-25 MED ORDER — VANCOMYCIN HCL 2000 MG/400ML IV SOLN
2000.0000 mg | Freq: Once | INTRAVENOUS | Status: AC
  Administered 2022-02-25: 2000 mg via INTRAVENOUS
  Filled 2022-02-25: qty 400

## 2022-02-25 MED ORDER — CHLORHEXIDINE GLUCONATE CLOTH 2 % EX PADS
6.0000 | MEDICATED_PAD | Freq: Every day | CUTANEOUS | Status: DC
  Administered 2022-02-26 – 2022-02-27 (×2): 6 via TOPICAL

## 2022-02-25 MED ORDER — HYDROCORTISONE SOD SUC (PF) 100 MG IJ SOLR
50.0000 mg | Freq: Two times a day (BID) | INTRAMUSCULAR | Status: DC
  Administered 2022-02-25 – 2022-02-28 (×6): 50 mg via INTRAVENOUS
  Filled 2022-02-25 (×6): qty 2

## 2022-02-25 MED ORDER — MUPIROCIN 2 % EX OINT: 1.0000 | TOPICAL_OINTMENT | Freq: Two times a day (BID) | CUTANEOUS | Status: DC

## 2022-02-25 MED ORDER — VANCOMYCIN HCL 1500 MG/300ML IV SOLN
1500.0000 mg | INTRAVENOUS | Status: DC
  Administered 2022-02-26: 1500 mg via INTRAVENOUS
  Filled 2022-02-25: qty 300

## 2022-02-25 NOTE — Evaluation (Signed)
Physical Therapy Evaluation Patient Details Name: [name] MRN: [id number] DOB: 26-Aug-1950 Today's Date: 02/25/2022  History of Present Illness  [name] is a 71 y.o. male with medical history significant for Barrett's esophagus, gastroparesis, hypertension, coronary artery disease, ascending aortic aneurysm, splenic marginal zone lymphoma, DVT/PE on Xarelto, A-fib, recent COVID-19 viral infection, recent hospitalization for multifocal pneumonia concerning for possible aspiration, intractable nausea and vomiting as well as delirium (patient was discharged on 02/14/22).  Patient's wife states that he was discharged about 10 days ago and was still slightly confused upon discharge and weak but has been stable at home until 2 days ago when he started becoming confused again.  She notes increased weakness and a fall on the day of admission.  She also notes that patient has frequency of urination as well as chills.  Patient's daughter notes that he is increasingly weak and has issues with his balance.  They state that his appetite has been good and he has not had any further episodes of nausea and vomiting.  In the ER he was noted to have room air pulse oximetry of 88% and is currently on 3 L of oxygen.  Chest x-ray shows residual heterogeneous bilateral airspace opacity corresponding  to history of pneumonia, these appear slightly improved as compared to the chest CT from September 6.  UA still pending at the time of this H&P  He received a dose of IV Rocephin and will be admitted to the hospital for further evaluation   Clinical Impression  Patient demonstrates labored movement and required use of bed rail for sitting up at bedside, fair/good balance for walking in room and hallway without use of an AD and no loss of balance.  Patient tolerated sitting up in chair after therapy with his spouse present in room.  Patient will benefit from continued skilled physical therapy in hospital and recommended  venue below to increase strength, balance, endurance for safe ADLs and gait.         Recommendations for follow up therapy are one component of a multi-disciplinary discharge planning process, led by the attending physician.  Recommendations may be updated based on patient status, additional functional criteria and insurance authorization.  Follow Up Recommendations Home health PT      Assistance Recommended at Discharge Set up Supervision/Assistance  Patient can return home with the following  A little help with walking and/or transfers;A little help with bathing/dressing/bathroom;Help with stairs or ramp for entrance;Assistance with cooking/housework    Equipment Recommendations None recommended by PT  Recommendations for Other Services       Functional Status Assessment Patient has had a recent decline in their functional status and demonstrates the ability to make significant improvements in function in a reasonable and predictable amount of time.     Precautions / Restrictions Precautions Precautions: Fall Restrictions Weight Bearing Restrictions: No      Mobility  Bed Mobility Overal bed mobility: Needs Assistance Bed Mobility: Rolling, Sidelying to Sit Rolling: Supervision Sidelying to sit: Supervision, Min guard       General bed mobility comments: slow labored movement requiring use of bed rail for sitting up at bedside with HOB slightly raised    Transfers Overall transfer level: Needs assistance Equipment used: None Transfers: Sit to/from Stand, Bed to chair/wheelchair/BSC Sit to Stand: Supervision   Step pivot transfers: Supervision       General transfer comment: slightly labored movement    Ambulation/Gait Ambulation/Gait assistance: Supervision Gait Distance (Feet): 70 Feet  Assistive device: None Gait Pattern/deviations: Decreased step length - right, Decreased step length - left, Decreased stride length Gait velocity: decr     General Gait  Details: slightly labored cadence without loss of balance or need for an AD  Stairs            Wheelchair Mobility    Modified Rankin (Stroke Patients Only)       Balance Overall balance assessment: Mild deficits observed, not formally tested                                           Pertinent Vitals/Pain Pain Assessment Pain Assessment: No/denies pain    Home Living Family/patient expects to be discharged to:: Private residence Living Arrangements: Spouse/significant other Available Help at Discharge: Family;Available 24 hours/day Type of Home: House Home Access: Stairs to enter Entrance Stairs-Rails: Left Entrance Stairs-Number of Steps: 4 Alternate Level Stairs-Number of Steps: 2 steps into den, flight of steps to second level Home Layout: Two level;Able to live on main level with bedroom/bathroom Home Equipment: Rolling Walker (2 wheels);Cane - single point      Prior Function Prior Level of Function : Independent/Modified Independent             Mobility Comments: Patient was community ambulator without AD, drives a few weeks ago, recently has been household ambulator without AD ADLs Comments: Assisted by family     Hand Dominance   Dominant Hand: Right    Extremity/Trunk Assessment   Upper Extremity Assessment Upper Extremity Assessment: Overall WFL for tasks assessed    Lower Extremity Assessment Lower Extremity Assessment: Generalized weakness    Cervical / Trunk Assessment Cervical / Trunk Assessment: Normal  Communication   Communication: No difficulties  Cognition Arousal/Alertness: Awake/alert Behavior During Therapy: WFL for tasks assessed/performed Overall Cognitive Status: Within Functional Limits for tasks assessed                                          General Comments      Exercises     Assessment/Plan    PT Assessment Patient needs continued PT services  PT Problem List Decreased  strength;Decreased activity tolerance;Decreased balance;Decreased mobility       PT Treatment Interventions DME instruction;Gait training;Stair training;Functional mobility training;Therapeutic activities;Therapeutic exercise;Patient/family education;Balance training    PT Goals (Current goals can be found in the Care Plan section)  Acute Rehab PT Goals Patient Stated Goal: return home with family to assist PT Goal Formulation: With patient/family Time For Goal Achievement: 03/04/22 Potential to Achieve Goals: Good    Frequency Min 3X/week     Co-evaluation               AM-PAC PT "6 Clicks" Mobility  Outcome Measure Help needed turning from your back to your side while in a flat bed without using bedrails?: None Help needed moving from lying on your back to sitting on the side of a flat bed without using bedrails?: A Little Help needed moving to and from a bed to a chair (including a wheelchair)?: A Little Help needed standing up from a chair using your arms (e.g., wheelchair or bedside chair)?: A Little Help needed to walk in hospital room?: A Little Help needed climbing 3-5 steps with a railing? : A Little  6 Click Score: 19    End of Session   Activity Tolerance: Patient tolerated treatment well;Patient limited by fatigue Patient left: in chair;with call bell/phone within reach;with family/visitor present Nurse Communication: Mobility status PT Visit Diagnosis: Unsteadiness on feet (R26.81);Other abnormalities of gait and mobility (R26.89);Muscle weakness (generalized) (M62.81)    Time: 0211-1552 PT Time Calculation (min) (ACUTE ONLY): 27 min   Charges:   PT Evaluation $PT Eval Moderate Complexity: 1 Mod PT Treatments $Therapeutic Activity: 23-37 mins        12:10 PM, 02/25/22 Lonell Grandchild, MPT Physical Therapist with [facility] 336 [phone] office [phone] mobile phone

## 2022-02-25 NOTE — Progress Notes (Addendum)
PROGRESS NOTE  JAISE MOSER QJJ:[id number] DOB: [birth year] DOA: 02/23/2022 PCP: [employer]mond [employer], MD  Brief History:  71 year old male with a history of paroxysmal atrial fibrillation, NASH, hypertension, hyperlipidemia, chronic intermittent nausea and vomiting attributable to gastroparesis status post Nissen fundoplication, Barrett's esophagus, ascending aortic aneurysm, DVT/PE on Xarelto since 05/2021, coronary artery disease status post PCI, melanoma stage III, and marginal zone lymphoma with pulmonary involvement presenting with generalized weakness, decreased oral intake, and worsening confusion. Notably, the patient had a recent hospitalization at [facility] from 02/05/2022 to 02/14/2022 when he initially presented for worsening nausea and vomiting and weight loss.  It was felt the patient had a flareup of his intermittent nausea and vomiting due to his gastroparesis.  His hospitalization was complicated by aspiration pneumonia.  The patient was treated with Unasyn and discharged home with oral amox/clav which he finished the course.  His hospitalization was prolonged secondary to hospital delirium and metabolic encephalopathy as well as new onset atrial fibrillation.  He was discharged home in stable condition with home health. According to the patient's spouse, the patient had some mild confusion at the time of discharge.  However his confusion had worsened 2 days after discharge home.  He has had increasing generalized weakness and decreased oral intake.  He had a fall on the day prior to this admission, but partly attributable to his impulsiveness.  No new meds or supplements since d/c.  Pt continued to have his usual "gagging" after eating.  Review of the medical record shows that the patient recently saw [facility], Dr. 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  Plan was continued observation.  The  patient has completed treatment with 4 cycles of rituximab May 2022.  He has scheduled follow-up for 3 months. In the ED, the patient had a fever up to 103.2 F with tachycardia.  He was hemodynamically stable.  Oxygen saturation was 97% 2 L.  Lactic acid peaked at 1.4.  UA showed >50WBC.  Chest x-ray showed improving bilateral infiltrates.  CT of the chest showed groundglass opacities in the perilobular and subpleural regions primarily peripherally.  There is GGO and consolidations.  Improved when compared to 02/05/2022.  There is chronic severe T4 compression fracture.  Blood cultures and urine cultures were obtained.  The patient was started on cefepime and vancomycin.    Assessment and Plan: Sepsis -Presented with fever, tachycardia, leukocytosis, and altered mental status -Lactic acid.  1.4>> 1.3 -Secondary to UTI although PNA and bacteremia cannot be ruled out presently -PCT 0.56 -Continue IV fluids -Vancomycin and cefepime -blood culture--neg -urine culture--mixed organism -02/23/22 CT chest-GGO primary peripheral with minimal consolidation LLL, LUL, although these have improved compared to CT 02/05/2022 -on low dose levophed>>wean off -wean IV hydrocortisone   Acute respiratory failure with hypoxia -Presented with tachypnea and hypoxia with oxygen saturation 88% on room air -02/23/2022 ABG 7.4 7/32/87/23 on 4 L -Wean oxygen as tolerated -Certainly, the patient may have continued component of aspiration pneumonitis -Concern about possible component of BOOP -Low threshold for initiating steroids if no improvement or if clinical worsening -stable on 3L presently -wean to RA   Pulmonary infiltrates -Concern about possible component of BOOP -Low threshold for initiating steroids if no improvement or if clinical worsening -Speech therapy evaluation -Patient to sit upright with oral care prior to eating -PCT 0.56 -Continue empiric vancomycin and  cefepime for now -MRSA screen    Pyuria -UA >50 WBC -Follow urine culture--mix organisms -will treat as UTI as pt had dysuria and altered mental status   Acute metabolic encephalopathy -Secondary to infectious process -Further work-up if no significant improvement -TSH 0.841 -Ammonia 13 -Minimize hypnotic medications -02/25/22--spouse states mentation is improved, near baseline   Failure to thrive -Multifactorial including the patient's recent hospitalizations, deconditioning, complex medical issues and poor baseline functioning -TSH 9.38 -B01--751 -Folic WCHE--52.7 -PT evaluation>>HHPT   New onset A-fib, paroxysmal Diagnosed on 02/08/2022.  Currently in sinus  Rate controlled on metoprolol Continue home Xarelto  Follow-up with cardiology    Gastroparesis/inttermittent N/V History of Nissen fundoplication complicated by Barrett's esophagus -on Dexilant at home>>pantoprazole while in hospital -continue metoclopramide 10 mg tid -tolerating diet presently   History CAD, ascending aortic aneurysm, hyperlipidemia. No chest pain. Troponins 8>>8 02/08/22 echo EF 55 to 60%, no WMA, trivial MR  Patient is on metoprolol, Ranexa, Xarelto and Crestor at home.  Continue home cardiac medications.   Essential hypertension BP stable. Holding metoprolol succinate due to hypotension   Chronic anxiety Continue PRN valium PDMP reviewed--receives intermittent diazepam '10mg'$ , last refill #90 01/01/22--single provider--[facility]   Chronic pain/Opioid dependence Percocet as needed PDMP reviewed--receives intermittent percocet 10/325, last refill #40 11/08/21--single provider--[facility]   History of marginal zone lymphoma, stage III melanoma Patient follows up with oncology at [facility], Dr. Caprice Beaver. Finished 4 cycles rituximab May 2022 Currently in remission as per the patient and had a recent follow-up.  On hydrocortisone 20 g in a.m. 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  Continue Senokot Metamucil Linzess           Family Communication:   spouse updated at bedside 9/26   Consultants:  none   Code Status:  FULL    DVT Prophylaxis:  xarelto     Procedures: As Listed in Progress Note Above   Antibiotics: Vanc 9/24>> Cefepime 9/24>>      The patient is critically ill with multiple organ systems failure and requires high complexity decision making for assessment and support, frequent evaluation and titration of therapies, application of advanced monitoring technologies and extensive interpretation of multiple databases.  Critical care time - 35 mins.      Subjective: Patient denies fevers, chills, headache, chest pain, dyspnea, nausea, vomiting, diarrhea, abdominal pain, dysuria, hematuria, hematochezia, and melena.   Objective: Vitals:   02/25/22 0500 02/25/22 0800 02/25/22 0900 02/25/22 1000  BP: 123/68  (!) 92/51 (!) 89/36  Pulse: 62  76 62  Resp: '20  19 19  '$ Temp:  97.6 F (36.4 C)    TempSrc:  Oral    SpO2: 95%  98% 100%  Weight:      Height:        Intake/Output Summary (Last 24 hours) at 02/25/2022 1100 Last data filed at 02/25/2022 0300 Gross per 24 hour  Intake 1768.72 ml  Output 950 ml  Net 818.72 ml   Weight change: 0.515 kg Exam:  General:  Pt is alert, follows commands appropriately, not in acute distress HEENT: No icterus, No thrush, No neck mass, [place]/AT Cardiovascular: RRR, S1/S2, no rubs, no gallops  Respiratory: fine bibasilar rales. No wheeze Abdomen: Soft/+BS, non tender, non distended, no guarding Extremities: No edema, No lymphangitis, No petechiae, No rashes, no synovitis   Data Reviewed: I have  personally reviewed following labs and imaging studies Basic Metabolic Panel: Recent Labs  Lab 02/23/22 1631 02/24/22 0449 02/25/22 0324  NA 135 136 138  K 3.9 5.0 4.5  CL 100 105 110  CO2 '23 23 23  '$ GLUCOSE 110* 119* 150*  BUN '17 18 23  '$ CREATININE 1.19 1.13 1.05  CALCIUM 9.2 8.8* 8.4*  MG  --   --  2.0   Liver Function Tests: Recent Labs  Lab 02/23/22 1631  AST 24  ALT 17  ALKPHOS 68  BILITOT 1.2  PROT 7.9  ALBUMIN 4.0   Recent Labs  Lab 02/23/22 1631  LIPASE 53*   Recent Labs  Lab 02/23/22 1632  AMMONIA 13   Coagulation Profile: Recent Labs  Lab 02/24/22 0449  INR 2.1*   CBC: Recent Labs  Lab 02/23/22 1631 02/24/22 0449 02/25/22 0324  WBC 14.6* 14.8* 13.3*  NEUTROABS 13.0*  --   --   HGB 13.2 11.4* 9.9*  HCT 38.9* 35.0* 30.5*  MCV 92.6 95.9 94.7  PLT 113* 87* 110*   Cardiac Enzymes: Recent Labs  Lab 02/24/22 0927  CKTOTAL 23*   BNP: Invalid input(s): "POCBNP" CBG: Recent Labs  Lab 02/25/22 0102  GLUCAP 151*   HbA1C: No results for input(s): "HGBA1C" in the last 72 hours. Urine analysis:    Component Value Date/Time   COLORURINE YELLOW 02/23/2022 1734   APPEARANCEUR HAZY (A) 02/23/2022 1734   LABSPEC 1.014 02/23/2022 1734   PHURINE 8.0 02/23/2022 1734   GLUCOSEU NEGATIVE 02/23/2022 1734   HGBUR MODERATE (A) 02/23/2022 1734   BILIRUBINUR NEGATIVE 02/23/2022 1734   KETONESUR 20 (A) 02/23/2022 1734   PROTEINUR 100 (A) 02/23/2022 1734   UROBILINOGEN 0.2 07/16/2012 1829   NITRITE NEGATIVE 02/23/2022 1734   LEUKOCYTESUR SMALL (A) 02/23/2022 1734   Sepsis Labs: '@LABRCNTIP'$ (procalcitonin:4,lacticidven:4) ) Recent Results (from the past 240 hour(s))  Culture, blood (routine x 2)     Status: None (Preliminary result)   Collection Time: 02/23/22  4:37 PM   Specimen: Left Antecubital; Blood  Result Value Ref Range Status   Specimen Description   Final    LEFT ANTECUBITAL BOTTLES DRAWN AEROBIC AND ANAEROBIC   Special Requests Blood  Culture adequate volume  Final   Culture   Final    NO GROWTH < 12 HOURS Performed at [facility], [address], [place], [place] [address]    Report Status PENDING  Incomplete  Culture, blood (routine x 2)     Status: None (Preliminary result)   Collection Time: 02/23/22  5:14 PM   Specimen: BLOOD LEFT HAND  Result Value Ref Range Status   Specimen Description BLOOD LEFT HAND BOTTLES DRAWN AEROBIC ONLY  Final   Special Requests   Final    Blood Culture results may not be optimal due to an inadequate volume of blood received in culture bottles   Culture   Final    NO GROWTH < 12 HOURS Performed at [facility], [address] [place], [place] [address]    Report Status PENDING  Incomplete  Urine Culture     Status: Abnormal   Collection Time: 02/23/22  5:34 PM   Specimen: Urine, Clean Catch  Result Value Ref Range Status   Specimen Description   Final    URINE, CLEAN CATCH Performed at [facility], [address], [place], [place] [address]    Special Requests   Final    NONE Performed at [facility], [address], [place], [place] [address]    Culture (A)  Final    20,000 COLONIES/mL MULTIPLE SPECIES PRESENT, SUGGEST RECOLLECTION   Report Status 02/25/2022 FINAL  Final  SARS Coronavirus 2 by RT PCR (hospital order, performed in [facility] hospital lab) *cepheid single result test* Anterior Nasal Swab     Status: None   Collection Time: 02/23/22  7:29 PM   Specimen: Anterior Nasal Swab  Result Value Ref Range Status   SARS Coronavirus 2 by RT PCR NEGATIVE NEGATIVE Final    Comment: (NOTE) SARS-CoV-2 target nucleic acids are NOT DETECTED.  The SARS-CoV-2 RNA is generally detectable in upper and lower respiratory specimens during the acute phase of infection. The lowest concentration of SARS-CoV-2 viral copies this assay can detect is 250 copies / mL. A negative result does not preclude SARS-CoV-2 infection and should not be used as the sole basis for treatment or  other patient management decisions.  A negative result may occur with improper specimen collection / handling, submission of specimen other than nasopharyngeal swab, presence of viral mutation(s) within the areas targeted by this assay, and inadequate number of viral copies (<250 copies / mL). A negative result must be combined with clinical observations, patient history, and epidemiological information.  Fact Sheet for Patients:   https://www.patel.info/  Fact Sheet for Healthcare Providers: https://hall.com/  This test is not yet approved or  cleared by the [place] FDA and has been authorized for detection and/or diagnosis of SARS-CoV-2 by FDA under an Emergency Use Authorization (EUA).  This EUA will remain in effect (meaning this test can be used) for the duration of the COVID-19 declaration under Section 564(b)(1) of the Act, 21 U.S.C. section 360bbb-3(b)(1), unless the authorization is terminated or revoked sooner.  Performed at [facility], [address], [place], [place] [address]      Scheduled Meds:  acetaminophen  650 mg Rectal Once   calcium-vitamin D  1 tablet Oral BID   Chlorhexidine Gluconate Cloth  6 each Topical Daily   hydrocortisone sod succinate (SOLU-CORTEF) inj  100 mg Intravenous Q12H   metoCLOPramide  10 mg Oral TID AC   multivitamin with minerals  1 tablet Oral Daily   omega-3 acid ethyl esters  1 g Oral BID   pantoprazole  40 mg Oral Daily   polyethylene glycol  17 g Oral Daily   Prucalopride Succinate  1 each Oral Daily   ranolazine  500 mg Oral BID   rivaroxaban  20 mg Oral Q supper   rosuvastatin  5 mg Oral QPM   senna  1 tablet Oral Daily   Continuous Infusions:  sodium chloride     ceFEPime (MAXIPIME) IV 2 g (02/25/22 0431)   lactated ringers 125 mL/hr at 02/24/22 1817   metronidazole 500 mg (02/25/22 0730)   norepinephrine (LEVOPHED) Adult infusion 2 mcg/min (02/25/22 0612)     Procedures/Studies: CT CHEST WO CONTRAST  Result Date: 02/23/2022 CLINICAL DATA:  Pneumonia, complication suspected, xray done. Fall. Weakness. EXAM: CT CHEST WITHOUT CONTRAST TECHNIQUE: Multidetector CT imaging of the chest was performed following the standard protocol without IV contrast. RADIATION DOSE REDUCTION: This exam was performed according to the departmental dose-optimization program which includes automated exposure control, adjustment of the mA and/or kV according to patient size and/or use of iterative reconstruction technique. COMPARISON:  02/05/2022 CT chest,  abdomen and pelvis. Chest radiograph from earlier today. FINDINGS: Substantially motion degraded scan, limiting assessment. Cardiovascular: Stable mild cardiomegaly. No significant pericardial effusion/thickening. Three-vessel coronary atherosclerosis. Atherosclerotic thoracic aorta with dilated 4.3 cm ascending thoracic aorta. Normal caliber pulmonary arteries. Mediastinum/Nodes: Hypodense 1.3 cm right thyroid nodule, unchanged. Not clinically significant; no follow-up imaging recommended (ref: J Am Coll Radiol. 2015 Feb;12(2): 143-50). Mildly patulous thoracic esophagus with fluid in the mid to lower thoracic esophagus. No pathologically enlarged axillary, mediastinal or hilar lymph nodes, noting limited sensitivity for the detection of hilar adenopathy on this noncontrast study. Lungs/Pleura: No pneumothorax. No pleural effusion. Mild-to-moderate patchy peripheral predominantly perilobular and subpleural regions of ground-glass opacity, reticulation and minimal consolidation in both lungs with some associated parenchymal distortion, most prominent in left upper and left lower lobes. No significant regions of bronchiectasis. The ground-glass opacities and consolidation have improved since 02/05/2022 chest CT. No lung masses or significant pulmonary nodules in the aerated portions of the lungs. Upper abdomen: Small to moderate hiatal  hernia. Partially visualize simple 3.9 cm upper left renal cyst, for which no follow-up imaging is recommended. Musculoskeletal: No aggressive appearing focal osseous lesions. Inferior approach spinal stimulator with lead terminating in the posterior midthoracic spinal canal. Partially visualized bilateral posterior spinal fusion hardware in the lower cervical spine. Bilateral posterior spinal fusion T2-T6 with chronic severe T4 vertebral compression fracture. Marked thoracic spondylosis. Partially visualized bilateral posterior spinal fusion hardware in the upper lumbar spine. IMPRESSION: 1. Substantially motion degraded scan, limiting assessment. 2. Spectrum of findings compatible with improving mild-to-moderate multilobar organizing pneumonia compared to 02/05/2022 chest CT, with early evolving postinfectious scarring. 3. Stable mild cardiomegaly. Three-vessel coronary atherosclerosis. 4. Small to moderate hiatal hernia. Mildly patulous thoracic esophagus with fluid in the mid to lower thoracic esophagus, suggesting esophageal dysmotility and/or gastroesophageal reflux. 5. Dilated 4.3 cm ascending thoracic aorta. Recommend annual imaging followup by CTA or MRA. This recommendation follows 2010 ACCF/AHA/AATS/ACR/ASA/SCA/SCAI/SIR/STS/SVM Guidelines for the Diagnosis and Management of Patients with Thoracic Aortic Disease. Circulation. 2010; 121: G626-R485. Aortic aneurysm NOS (ICD10-I71.9). 6. Chronic severe T4 vertebral compression fracture. 7. Aortic Atherosclerosis (ICD10-I70.0). Electronically Signed   By: Ilona Sorrel M.D.   On: 02/23/2022 20:00   DG Chest Portable 1 View  Result Date: 02/23/2022 CLINICAL DATA:  Shortness of breath EXAM: PORTABLE CHEST 1 VIEW COMPARISON:  CT 02/05/2022, chest x-ray 02/11/2017 FINDINGS: Spinal rods and fixating screws in the cervical and thoracic spine. Partially visualized ascending thoracic stimulator leads at the lower thoracic spine. Mild heterogeneous airspace  opacities, appears slightly improved as compared with chest CT from September 6. Cardiomegaly. Possible tiny pleural effusions. No visible pneumothorax IMPRESSION: 1. Cardiomegaly with possible tiny effusions 2. Residual heterogeneous bilateral airspace opacity corresponding to history of pneumonia, these appear slightly improved as compared to the chest CT from September 6. Electronically Signed   By: Donavan Foil M.D.   On: 02/23/2022 17:39   CT HEAD WO CONTRAST (5MM)  Result Date: 02/11/2022 CLINICAL DATA:  Altered mental status. EXAM: CT HEAD WITHOUT CONTRAST TECHNIQUE: Contiguous axial images were obtained from the base of the skull through the vertex without intravenous contrast. RADIATION DOSE REDUCTION: This exam was performed according to the departmental dose-optimization program which includes automated exposure control, adjustment of the mA and/or kV according to patient size and/or use of iterative reconstruction technique. COMPARISON:  March 06, 2015 FINDINGS: Brain: There is mild cerebral atrophy with widening of the extra-axial spaces and ventricular dilatation. There are areas of decreased attenuation within the white  matter tracts of the supratentorial brain, consistent with microvascular disease changes. Vascular: No hyperdense vessel or unexpected calcification. Skull: Normal. Negative for fracture or focal lesion. Sinuses/Orbits: No acute finding. Other: None. IMPRESSION: Generalized cerebral atrophy and chronic white matter small vessel ischemic changes without evidence of an acute intracranial abnormality. Electronically Signed   By: Virgina Norfolk M.D.   On: 02/11/2022 20:08   IR US CHEST  Result Date: 02/11/2022 CLINICAL DATA:  Possible pleural effusion EXAM: CHEST ULTRASOUND COMPARISON:  Chest x-ray 02/10/2022 FINDINGS: Sonographic evaluation of the abdomen demonstrates no evidence of ascites or pleural effusion. Sonographic evaluation of the chest demonstrates no evidence of  pleural effusion. IMPRESSION: Negative for pleural effusion and ascites. Electronically Signed   By: Jacqulynn Cadet M.D.   On: 02/11/2022 15:49   DG Chest Port 1 View  Result Date: 02/10/2022 CLINICAL DATA:  Dyspnea EXAM: PORTABLE CHEST 1 VIEW COMPARISON:  2022/02/11, February 05, 2022 FINDINGS: Limited chest radiograph due to patient positioning and motion artifact. Partially visualized cervical and thoracic fusion hardware and spinal stimulator device. Widened mediastinal and cardiac contours, likely positional. Multifocal bilateral heterogeneous pulmonary opacities. Moderate left and small right pleural effusions. No large pneumothorax. No acute osseous abnormality. The visualized upper abdomen is unremarkable. IMPRESSION: 1. Markedly limited chest radiograph due to patient positioning and motion artifact. Within this limitation, there is multifocal bilateral heterogeneous pulmonary opacities, concerning for multifocal infection. 2. Moderate left and small right pleural effusions. Electronically Signed   By: Beryle Flock M.D.   On: 02/10/2022 12:26   DG Abd Portable 1V  Result Date: 02/09/2022 CLINICAL DATA:  Abdominal pain EXAM: PORTABLE ABDOMEN - 1 VIEW COMPARISON:  Previous studies including the CT done on 02-11-22 FINDINGS: Bowel gas pattern is nonspecific. There is residual contrast in the colon from previous studies. There is no significant small bowel dilation. Stomach is unremarkable. There is previous surgical fusion in lumbar spine. There is previous left hip arthroplasty. Pain control lead is seen in thoracic spinal canal. IMPRESSION: Nonspecific bowel gas pattern. Electronically Signed   By: Elmer Picker M.D.   On: 02/09/2022 12:32   ECHOCARDIOGRAM COMPLETE  Result Date: 02/08/2022    ECHOCARDIOGRAM REPORT   Patient Name:   ROURKE [facility]QUIT[facility] Date of Exam: 02/08/2022 Medical Rec #:  [id number]      Height:       72.0 in Accession #:    [id number]     Weight:       246.9 lb  Date of Birth:  09-11-1950      BSA:          2.330 m Patient Age:    54 years       BP:           90/60 mmHg Patient Gender: M              HR:           74 bpm. Exam Location:  Inpatient Procedure: 2D Echo Indications:    Atrial fibrillation  History:        Patient has prior history of Echocardiogram examinations, most                 recent 05/06/2021. Risk Factors:Hypertension and Sleep Apnea.  Sonographer:    Johny Chess RDCS Referring Phys: [id number] [place]  1. Left ventricular ejection fraction, by estimation, is 55 to 60%. The left ventricle has normal function. The left ventricle has no regional wall  motion abnormalities. There is mild left ventricular hypertrophy. Left ventricular diastolic parameters are indeterminate.  2. Right ventricular systolic function is normal. The right ventricular size is normal. Tricuspid regurgitation signal is inadequate for assessing PA pressure.  3. The mitral valve is normal in structure. Trivial mitral valve regurgitation. No evidence of mitral stenosis.  4. The aortic valve is tricuspid. There is mild calcification of the aortic valve. There is mild thickening of the aortic valve. Aortic valve regurgitation is mild.  5. Aortic dilatation noted. There is moderate dilatation of the aortic root, measuring 44 mm. There is moderate dilatation of the ascending aorta, measuring 43 mm.  6. The inferior vena cava is normal in size with greater than 50% respiratory variability, suggesting right atrial pressure of 3 mmHg. FINDINGS  Left Ventricle: Left ventricular ejection fraction, by estimation, is 55 to 60%. The left ventricle has normal function. The left ventricle has no regional wall motion abnormalities. The left ventricular internal cavity size was normal in size. There is  mild left ventricular hypertrophy. Left ventricular diastolic parameters are indeterminate. Right Ventricle: The right ventricular size is normal. Right vetricular wall  thickness was not well visualized. Right ventricular systolic function is normal. Tricuspid regurgitation signal is inadequate for assessing PA pressure. Left Atrium: Left atrial size was normal in size. Right Atrium: Right atrial size was normal in size. Pericardium: There is no evidence of pericardial effusion. Mitral Valve: The mitral valve is normal in structure. Trivial mitral valve regurgitation. No evidence of mitral valve stenosis. Tricuspid Valve: The tricuspid valve is not well visualized. Tricuspid valve regurgitation is trivial. No evidence of tricuspid stenosis. Aortic Valve: The aortic valve is tricuspid. There is mild calcification of the aortic valve. There is mild thickening of the aortic valve. There is mild aortic valve annular calcification. Aortic valve regurgitation is mild. Pulmonic Valve: The pulmonic valve was not well visualized. Pulmonic valve regurgitation is not visualized. No evidence of pulmonic stenosis. Aorta: Aortic dilatation noted. There is moderate dilatation of the aortic root, measuring 44 mm. There is moderate dilatation of the ascending aorta, measuring 43 mm. Venous: The inferior vena cava is normal in size with greater than 50% respiratory variability, suggesting right atrial pressure of 3 mmHg. IAS/Shunts: The interatrial septum was not well visualized.  LEFT VENTRICLE PLAX 2D LVIDd:         5.40 cm   Diastology LVIDs:         4.00 cm   LV e' medial:    5.87 cm/s LV PW:         1.20 cm   LV E/e' medial:  11.0 LV IVS:        1.40 cm   LV e' lateral:   8.05 cm/s LVOT diam:     2.60 cm   LV E/e' lateral: 8.0 LV SV:         63 LV SV Index:   27 LVOT Area:     5.31 cm  RIGHT VENTRICLE             IVC RV S prime:     14.30 cm/s  IVC diam: 2.70 cm TAPSE (M-mode): 2.0 cm LEFT ATRIUM             Index        RIGHT ATRIUM           Index LA diam:        4.70 cm 2.02 cm/m   RA Area:  16.40 cm LA Vol (A2C):   57.9 ml 24.85 ml/m  RA Volume:   38.70 ml  16.61 ml/m LA Vol (A4C):    79.0 ml 33.90 ml/m LA Biplane Vol: 71.3 ml 30.60 ml/m  AORTIC VALVE LVOT Vmax:   63.90 cm/s LVOT Vmean:  41.900 cm/s LVOT VTI:    0.119 m  AORTA Ao Root diam: 4.40 cm Ao Asc diam:  4.30 cm MITRAL VALVE MV Area (PHT): 3.42 cm    SHUNTS MV Decel Time: 222 msec    Systemic VTI:  0.12 m MV E velocity: 64.50 cm/s  Systemic Diam: 2.60 cm MV A velocity: 61.30 cm/s MV E/A ratio:  1.05 Carlyle Dolly MD Electronically signed by Carlyle Dolly MD Signature Date/Time: 02/08/2022/4:55:02 PM    Final    US Abdomen Limited RUQ (LIVER/GB)  Result Date: 02/05/2022 CLINICAL DATA:  Right upper quadrant abdominal pain. History of cirrhosis. EXAM: ULTRASOUND ABDOMEN LIMITED RIGHT UPPER QUADRANT COMPARISON:  CT the chest, abdomen and pelvis-earlier same day FINDINGS: Gallbladder: Normal sonographic appearance of the gallbladder. No gallbladder wall thickening or pericholecystic stranding. No echogenic gallstones or biliary sludge. Negative sonographic Murphy's sign. Common bile duct: Diameter: Normal in size measuring 4 mm in diameter Liver: Normal echogenicity of the hepatic parenchyma however there is questioned mild nodularity of the hepatic contour (representative image 26). No discrete hepatic lesions. No intrahepatic biliary dilatation. Portal vein is patent on color Doppler imaging with normal direction of blood flow towards the liver. Other: None. IMPRESSION: Suspected mild nodularity of the hepatic contour with preservation of the hepatic parenchymal echogenicity, nonspecific though could be seen in the setting of early cirrhotic change. Correlation with LFTs is advised. Electronically Signed   By: Sandi Mariscal M.D.   On: 02/05/2022 15:20   CT CHEST ABDOMEN PELVIS W CONTRAST  Result Date: 02/05/2022 CLINICAL DATA:  Abdominal pain, weakness EXAM: CT CHEST, ABDOMEN, AND PELVIS WITH CONTRAST TECHNIQUE: Multidetector CT imaging of the chest, abdomen and pelvis was performed following the standard protocol during bolus  administration of intravenous contrast. RADIATION DOSE REDUCTION: This exam was performed according to the departmental dose-optimization program which includes automated exposure control, adjustment of the mA and/or kV according to patient size and/or use of iterative reconstruction technique. CONTRAST:  130m OMNIPAQUE IOHEXOL 300 MG/ML  SOLN COMPARISON:  Previous studies including the CT abdomen and pelvis done on 01/27/2022 FINDINGS: CT CHEST FINDINGS Cardiovascular: There is homogeneous enhancement in thoracic aorta. 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 There are no intraluminal filling defects in central pulmonary artery branches. Peripheral pulmonary artery branches are not adequately visualized for evaluation. Coronary artery calcifications are seen. Mediastinum/Nodes: Unremarkable. Lungs/Pleura: Fairly extensive new patchy ground-glass infiltrates are noted in both lungs. There is minimal bilateral pleural effusions. There is no pneumothorax. Musculoskeletal: There is surgical fusion in cervical and upper thoracic spine. Pain control lead is seen in thoracic spinal canal. CT ABDOMEN PELVIS FINDINGS Hepatobiliary: There is fatty infiltration. Gallbladder is distended. There is no wall thickening. There is no fluid around the gallbladder. There is no dilation of bile ducts. Pancreas: No focal abnormalities are seen. Spleen: Spleen is enlarged measuring 16 cm in maximum diameter. Adrenals/Urinary Tract: Adrenals are unremarkable. There is no hydronephrosis. There is 4.3 cm parapelvic cyst in the upper pole of left kidney. No follow-up imaging is recommended. There are no renal or ureteral stones. Urinary bladder is unremarkable. Stomach/Bowel: Small hiatal hernia is seen. There are surgical clips in the posterior mediastinum  adjacent to the hiatal hernia. Small bowel loops are not dilated. Appendix is not dilated. Scattered diverticula are seen in colon without signs of  focal acute diverticulitis. Vascular/Lymphatic: Arterial calcifications are seen. Reproductive: Unremarkable. Other: There is no ascites or pneumoperitoneum. There is previous ventral hernia repair. Umbilical hernia containing fat is seen. Bilateral inguinal hernias containing fat are seen, larger on the right side. Musculoskeletal: There is previous left hip arthroplasty. Lumbar surgical fusion is seen. Decrease in height of body of T4 vertebra has not changed. No recent fracture is seen. IMPRESSION: New extensive patchy infiltrates are seen in both lungs suggesting multifocal pneumonia. Minimal bilateral pleural effusions. Coronary artery disease. 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 Recommend annual imaging followup by CTA or MRA. This recommendation follows 2010 ACCF/AHA/AATS/ACR/ASA/SCA/SCAI/SIR/STS/SVM Guidelines for the Diagnosis and Management of Patients with Thoracic Aortic Disease. Circulation. 2010; 121: P809-X833. Aortic aneurysm NOS (ICD10-I71.9) There is no evidence of intestinal obstruction or pneumoperitoneum. Appendix is not dilated. There is no hydronephrosis. Fatty liver. Small hiatal hernia. Splenomegaly. There is parapelvic cyst in the left kidney. Diverticulosis of colon without signs of focal diverticulitis. Other findings as described in the body of the report. Electronically Signed   By: Elmer Picker M.D.   On: 02/05/2022 13:26   CT ABDOMEN PELVIS W CONTRAST  Result Date: 01/27/2022 CLINICAL DATA:  Generalized abdominal pain, fell at 0430 hours this morning going to the bathroom, weakness, RIGHT rib pain EXAM: CT ABDOMEN AND PELVIS WITH CONTRAST TECHNIQUE: Multidetector CT imaging of the abdomen and pelvis was performed using the standard protocol following bolus administration of intravenous contrast. RADIATION DOSE REDUCTION: This exam was performed according to the departmental dose-optimization program which includes automated exposure control,  adjustment of the mA and/or kV according to patient size and/or use of iterative reconstruction technique. CONTRAST:  130m OMNIPAQUE IOHEXOL 300 MG/ML SOLN IV. No oral contrast. COMPARISON:  PET-CT 06/05/2020 FINDINGS: Lower chest: Minimal LEFT basilar atelectasis. Hepatobiliary: Gallbladder and liver normal appearance Pancreas: Multiple calcifications within or adjacent to uncinate process of pancreas without soft tissue mass or cyst. Otherwise normal appearance. Spleen: Normal appearance Adrenals/Urinary Tract: Adrenal glands normal appearance. Peripelvic cyst LEFT kidney 4.3 x 3.1 cm image 31 unchanged, simple features by CT; no follow-up imaging recommended. Small nonobstructing renal calculi. No hydronephrosis or hydroureter. Bladder unremarkable. Stomach/Bowel: Descending and sigmoid diverticulosis without evidence of diverticulitis. Normal appendix. Stomach and remaining bowel loops unremarkable. Vascular/Lymphatic: Atherosclerotic calcifications aorta, iliac arteries, coronary arteries. Aorta normal caliber. No adenopathy. Reproductive: Minimal prostatic enlargement. Seminal vesicles unremarkable. Other: BILATERAL hernias containing fat.  No free air or free fluid. Musculoskeletal: LEFT hip prosthesis. Osseous demineralization. Prior lumbosacral fusion L1-S1. Intraspinal stimulator. No acute fractures. IMPRESSION: BILATERAL hernias containing fat. Distal colonic diverticulosis without evidence of diverticulitis. Small nonobstructing renal calculi. No acute intra-abdominal or intrapelvic abnormalities. Aortic Atherosclerosis (ICD10-I70.0). Electronically Signed   By: MLavonia DanaM.D.   On: 01/27/2022 13:46   DG Ribs Unilateral W/Chest Right  Result Date: 01/27/2022 CLINICAL DATA:  Fall with right rib pain EXAM: RIGHT RIBS AND CHEST - 3+ VIEW COMPARISON:  None Available. FINDINGS: No fracture or other bone lesions are seen involving the ribs. There is no evidence of pneumothorax or pleural effusion. Both  lungs are clear. Heart size and mediastinal contours are within normal limits. IMPRESSION: Negative. Electronically Signed   By: MNelson ChimesM.D.   On: 01/27/2022 11:44   DG Shoulder Right  Result Date: 01/27/2022 CLINICAL DATA:  FGolden Circletoday with shoulder  pain EXAM: RIGHT SHOULDER - 2+ VIEW COMPARISON:  None Available. FINDINGS: Glenohumeral joint is normal. No evidence of regional fracture. Mild widening of the [facility] joint which could be chronic or indicate a minor AC joint injury. IMPRESSION: No fracture or dislocation. Question mild widening of the [facility] joint which could be chronic or could indicate a ligamentous injury. Electronically Signed   By: Nelson Chimes M.D.   On: 01/27/2022 11:43    Orson Eva, DO  [facility]  If 7PM-7AM, please contact night-coverage [email or web address] Password [email or web address] 02/25/2022, 11:00 AM   LOS: 2 days

## 2022-02-25 NOTE — Plan of Care (Signed)
  Problem: Acute Rehab PT Goals(only PT should resolve) Goal: Pt Will Go Supine/Side To Sit Outcome: Progressing Flowsheets (Taken 02/25/2022 1211) Pt will go Supine/Side to Sit: with modified independence Goal: Patient Will Transfer Sit To/From Stand Outcome: Progressing Flowsheets (Taken 02/25/2022 1211) Patient will transfer sit to/from stand: with modified independence Goal: Pt Will Transfer Bed To Chair/Chair To Bed Outcome: Progressing Flowsheets (Taken 02/25/2022 1211) Pt will Transfer Bed to Chair/Chair to Bed: with modified independence Goal: Pt Will Ambulate Outcome: Progressing Flowsheets (Taken 02/25/2022 1211) Pt will Ambulate:  100 feet  with least restrictive assistive device   12:11 PM, 02/25/22 Lonell Grandchild, MPT Physical Therapist with [facility] [phone] office [phone] mobile phone

## 2022-02-26 DIAGNOSIS — R410 Disorientation, unspecified: Secondary | ICD-10-CM | POA: Diagnosis not present

## 2022-02-26 DIAGNOSIS — K3184 Gastroparesis: Secondary | ICD-10-CM | POA: Diagnosis not present

## 2022-02-26 DIAGNOSIS — K219 Gastro-esophageal reflux disease without esophagitis: Secondary | ICD-10-CM

## 2022-02-26 DIAGNOSIS — J9601 Acute respiratory failure with hypoxia: Secondary | ICD-10-CM | POA: Diagnosis not present

## 2022-02-26 DIAGNOSIS — C8307 Small cell B-cell lymphoma, spleen: Secondary | ICD-10-CM

## 2022-02-26 DIAGNOSIS — G9341 Metabolic encephalopathy: Secondary | ICD-10-CM | POA: Diagnosis not present

## 2022-02-26 LAB — CBC
HCT: 30.3 % — ABNORMAL LOW (ref 39.0–52.0)
Hemoglobin: 9.6 g/dL — ABNORMAL LOW (ref 13.0–17.0)
MCH: 30.1 pg (ref 26.0–34.0)
MCHC: 31.7 g/dL (ref 30.0–36.0)
MCV: 95 fL (ref 80.0–100.0)
Platelets: 97 10*3/uL — ABNORMAL LOW (ref 150–400)
RBC: 3.19 MIL/uL — ABNORMAL LOW (ref 4.22–5.81)
RDW: 14.5 % (ref 11.5–15.5)
WBC: 5.9 10*3/uL (ref 4.0–10.5)
nRBC: 0 % (ref 0.0–0.2)

## 2022-02-26 LAB — BASIC METABOLIC PANEL
Anion gap: 5 (ref 5–15)
BUN: 18 mg/dL (ref 8–23)
CO2: 25 mmol/L (ref 22–32)
Calcium: 8.4 mg/dL — ABNORMAL LOW (ref 8.9–10.3)
Chloride: 111 mmol/L (ref 98–111)
Creatinine, Ser: 0.89 mg/dL (ref 0.61–1.24)
GFR, Estimated: >60 mL/min (ref >60)
Glucose, Bld: 130 mg/dL — ABNORMAL HIGH (ref 70–99)
Potassium: 3.7 mmol/L (ref 3.5–5.1)
Sodium: 141 mmol/L (ref 135–145)

## 2022-02-26 MED ORDER — METOPROLOL SUCCINATE ER 25 MG PO TB24
12.5000 mg | ORAL_TABLET | Freq: Every day | ORAL | Status: DC
  Administered 2022-02-26 – 2022-02-28 (×3): 12.5 mg via ORAL
  Filled 2022-02-26 (×3): qty 1

## 2022-02-26 NOTE — Progress Notes (Signed)
Physical Therapy Treatment Patient Details Name: [name] MRN: [id number] DOB: [birth year] Today's Date: 02/26/2022   History of Present Illness [name] is a 71 y.o. male with medical history significant for Barrett's esophagus, gastroparesis, hypertension, coronary artery disease, ascending aortic aneurysm, splenic marginal zone lymphoma, DVT/PE on Xarelto, A-fib, recent COVID-19 viral infection, recent hospitalization for multifocal pneumonia concerning for possible aspiration, intractable nausea and vomiting as well as delirium (patient was discharged on 02/14/22).  Patient's wife states that he was discharged about 10 days ago and was still slightly confused upon discharge and weak but has been stable at home until 2 days ago when he started becoming confused again.  She notes increased weakness and a fall on the day of admission.  She also notes that patient has frequency of urination as well as chills.  Patient's daughter notes that he is increasingly weak and has issues with his balance.  They state that his appetite has been good and he has not had any further episodes of nausea and vomiting.  In the ER he was noted to have room air pulse oximetry of 88% and is currently on 3 L of oxygen.  Chest x-ray shows residual heterogeneous bilateral airspace opacity corresponding  to history of pneumonia, these appear slightly improved as compared to the chest CT from September 6.  UA still pending at the time of this H&P  He received a dose of IV Rocephin and will be admitted to the hospital for further evaluation    PT Comments    Making excellent progress, ambulating 300 feet today with one standing rest break to complete distance at supervision level for safety. No loss of balance while pushing IV pole, therapist managing lines/leads. HR to 130 at peak, avg 120s during ambulation, SpO2 92-93% during session on room air. Patient will continue to benefit from skilled physical therapy services  to further improve independence with functional mobility.    Recommendations for follow up therapy are one component of a multi-disciplinary discharge planning process, led by the attending physician.  Recommendations may be updated based on patient status, additional functional criteria and insurance authorization.  Follow Up Recommendations  Home health PT     Assistance Recommended at Discharge Set up Supervision/Assistance  Patient can return home with the following A little help with walking and/or transfers;A little help with bathing/dressing/bathroom;Help with stairs or ramp for entrance;Assistance with cooking/housework   Equipment Recommendations  None recommended by PT    Recommendations for Other Services       Precautions / Restrictions Precautions Precautions: Fall Precaution Comments: watch vitals Restrictions Weight Bearing Restrictions: No     Mobility  Bed Mobility               General bed mobility comments: in recliner    Transfers Overall transfer level: Needs assistance Equipment used: None Transfers: Sit to/from Stand Sit to Stand: Supervision   Step pivot transfers: Supervision       General transfer comment: Stood from chair 3 times during session, no physical assist, managed lines/leads for patient. no AD required.    Ambulation/Gait Ambulation/Gait assistance: Supervision Gait Distance (Feet): 300 Feet Assistive device: IV Pole Gait Pattern/deviations: Decreased step length - right, Decreased step length - left, Decreased stride length Gait velocity: decr Gait velocity interpretation: <1.8 ft/sec, indicate of risk for recurrent falls   General Gait Details: Completed 300 feet with no AD, pushing IV pole, therapist mangaged lines/leads. No buckling noted, mild instability but able  to self correct. HR to 130 at peak, avg 120, SpO2 92-93% on room air. Denies dizziness or dyspnea. One standing rest break.   Stairs              Wheelchair Mobility    Modified Rankin (Stroke Patients Only)       Balance Overall balance assessment: Mild deficits observed, not formally tested Sitting-balance support: No upper extremity supported, Feet supported Sitting balance-Leahy Scale: Fair     Standing balance support: No upper extremity supported Standing balance-Leahy Scale: Fair                              Cognition Arousal/Alertness: Awake/alert Behavior During Therapy: WFL for tasks assessed/performed Overall Cognitive Status: Impaired/Different from baseline Area of Impairment: Orientation                 Orientation Level: Disoriented to, Time (One month off)             General Comments: Reports it is october, able to recall at end of session after correcting earlier in treatment.        Exercises      General Comments General comments (skin integrity, edema, etc.): SpO2 92-93% on RA throughout session. HR up to 130, avg 120s during ambulation.      Pertinent Vitals/Pain Pain Assessment Pain Assessment: No/denies pain Pain Intervention(s): Monitored during session    Home Living                          Prior Function            PT Goals (current goals can now be found in the care plan section) Acute Rehab PT Goals Patient Stated Goal: return home with family to assist PT Goal Formulation: With patient/family Time For Goal Achievement: 03/04/22 Potential to Achieve Goals: Good Progress towards PT goals: Progressing toward goals    Frequency    Min 3X/week      PT Plan Current plan remains appropriate    Co-evaluation              AM-PAC PT "6 Clicks" Mobility   Outcome Measure  Help needed turning from your back to your side while in a flat bed without using bedrails?: None Help needed moving from lying on your back to sitting on the side of a flat bed without using bedrails?: A Little Help needed moving to and from a bed to a  chair (including a wheelchair)?: A Little Help needed standing up from a chair using your arms (e.g., wheelchair or bedside chair)?: A Little Help needed to walk in hospital room?: A Little Help needed climbing 3-5 steps with a railing? : A Little 6 Click Score: 19    End of Session Equipment Utilized During Treatment: Gait belt Activity Tolerance: Patient tolerated treatment well Patient left: in chair;with call bell/phone within reach;with family/visitor present;with nursing/sitter in room Nurse Communication: Mobility status PT Visit Diagnosis: Unsteadiness on feet (R26.81);Other abnormalities of gait and mobility (R26.89);Muscle weakness (generalized) (M62.81)     Time: 2706-2376 PT Time Calculation (min) (ACUTE ONLY): 17 min  Charges:  $Gait Training: 8-22 mins                     Candie Mile, PT, DPT Physical Therapist Acute Rehabilitation Services [place]  Ellouise Newer 02/26/2022, 11:31 AM

## 2022-02-26 NOTE — Progress Notes (Signed)
Spoke with patient's primary RN. Endorsed that patient is no longer on Levophed since 02/26/22. No USGPIV necessary.

## 2022-02-26 NOTE — Progress Notes (Signed)
PROGRESS NOTE  [name] DJT:[id number] DOB: Dec 30, 1950 DOA: 02/23/2022 PCP: Redmond School, MD  Brief History:  71 year old male with a history of paroxysmal atrial fibrillation, NASH, hypertension, hyperlipidemia, chronic intermittent nausea and vomiting attributable to gastroparesis status post Nissen fundoplication, Barrett's esophagus, ascending aortic aneurysm, DVT/PE on Xarelto since 05/2021, coronary artery disease status post PCI, melanoma stage III, and marginal zone lymphoma with pulmonary involvement presenting with generalized weakness, decreased oral intake, and worsening confusion. Notably, the patient had a recent hospitalization at [facility] from 02/05/2022 to 02/14/2022 when he initially presented for worsening nausea and vomiting and weight loss.  It was felt the patient had a flareup of his intermittent nausea and vomiting due to his gastroparesis.  His hospitalization was complicated by aspiration pneumonia.  The patient was treated with Unasyn and discharged home with oral amox/clav which he finished the course.  His hospitalization was prolonged secondary to hospital delirium and metabolic encephalopathy as well as new onset atrial fibrillation.  He was discharged home in stable condition with home health. According to the patient's spouse, the patient had some mild confusion at the time of discharge.  However his confusion had worsened 2 days after discharge home.  He has had increasing generalized weakness and decreased oral intake.  He had a fall on the day prior to this admission, but partly attributable to his impulsiveness.  No new meds or supplements since d/c.  Pt continued to have his usual "gagging" after eating.  Review of the medical record shows that the patient recently saw [facility] onc, Dr. 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  Plan was continued observation.  The  patient has completed treatment with 4 cycles of rituximab May 2022.  He has scheduled follow-up for 3 months. In the ED, the patient had a fever up to 103.2 F with tachycardia.  He was hemodynamically stable.  Oxygen saturation was 97% 2 L.  Lactic acid peaked at 1.4.  UA showed >50WBC.  Chest x-ray showed improving bilateral infiltrates.  CT of the chest showed groundglass opacities in the perilobular and subpleural regions primarily peripherally.  There is GGO and consolidations.  Improved when compared to 02/05/2022.  There is chronic severe T4 compression fracture.  Blood cultures and urine cultures were obtained.  The patient was started on cefepime and vancomycin.    Assessment and Plan: Sepsis -Presented with fever, tachycardia, leukocytosis, and altered mental status -Lactic acid.  1.4>> 1.3 -Secondary to UTI although PNA and bacteremia cannot be ruled out presently -PCT 0.56 -Continue IV fluids -Vancomycin and cefepime -blood culture--neg -urine culture--mixed organism -02/23/22 CT chest-GGO primary peripheral with minimal consolidation LLL, LUL, although these have improved compared to CT 02/05/2022 -Continue weaning of hydrocortisone; patient is off Levophed -Continue current antibiotics and follow sensitivity -Will transfer to telemetry bed.   Acute respiratory failure with hypoxia -Presented with tachypnea and hypoxia with oxygen saturation 88% on room air -02/23/2022 ABG 7.4 7/32/87/23 on 4 L -Wean oxygen as tolerated -Certainly, the patient may have continued component of aspiration pneumonitis -Concern about possible component of BOOP -Low threshold for initiating steroids if no improvement or if clinical worsening -Currently with good saturation on room air.   Pulmonary infiltrates -Concern about possible component of BOOP -Low threshold for initiating steroids if no improvement or if clinical worsening -Speech therapy evaluation -Patient to sit upright with  oral care  prior to eating -PCT 0.56 -Continue empiric vancomycin and cefepime for now -MRSA screen   Pyuria -UA >50 WBC -Follow urine culture--mix organisms -will treat as UTI as pt had dysuria and altered mental status at time of admission. -Improved mentation currently appreciated on exam.   Acute metabolic encephalopathy -Secondary to infectious process -Further work-up if no significant improvement -TSH 0.841 -Ammonia 13 -Minimize hypnotic medications -02/25/22--spouse states mentation is improved, near baseline   Failure to thrive -Multifactorial including the patient's recent hospitalizations, deconditioning, complex medical issues and poor baseline functioning. -TSH 0.62 -B76--283 -Folic TDVV--61.6 -PT evaluation>>HHPT -Body mass index is 33.52 kg/m. -Advised to follow adequate nutrition and hydration.   New onset A-fib, paroxysmal -Diagnosed on 02/08/2022.  -Currently rate controlled; sinus bradycardia appreciated. -Continue metoprolol and Xarelto -Outpatient Follow-up with cardiology recommended.   Gastroparesis/inttermittent N/V History of Nissen fundoplication complicated by Barrett's esophagus -on Dexilant at home>>pantoprazole while in hospital -continue metoclopramide 10 mg tid -tolerating diet presently -Continue patient follow-up with GI service.   History CAD, ascending aortic aneurysm, hyperlipidemia. -Patient denies chest pain or shortness of breath -Troponins 8>>8 -02/08/22 echo EF 55 to 60%, no WMA, trivial MR  -Patient is on metoprolol, Ranexa, Xarelto and Crestor at home.  Continue medications at adjusted doses.   Essential hypertension -BP stable currently. -Low-dose metoprolol will be initiated to prevent rebound tachycardia/uncontrolled arrhythmia. -Follow-up vital signs.   Chronic anxiety -Continue PRN valium -PDMP reviewed--receives intermittent diazepam '10mg'$ , last refill #90 01/01/22--single provider--[place] -Stable mood.   Chronic pain/Opioid  dependence -Continue Percocet as needed -PDMP reviewed--receives intermittent percocet 10/325, last refill #40 11/08/21--single provider--[place]   History of marginal zone lymphoma, stage III melanoma -Patient follows up with oncology at [facility], Dr. Caprice Beaver. -Finished 4 cycles rituximab May 2022 -Currently in remission as per patient report. -On hydrocortisone 20 g in a.m. and 10 mg in p.m from home (currently receiving stress IV steroids) -Follow-up vital signs.   DVT/PE December 2022  -Continue Xarelto.   Fatty liver -02/05/22 RUQ US--mild nodularity of the hepatic contour with preservation of the hepatic parenchymal echogenicity, nonspecific though could be seen in the setting of early cirrhotic changes. -Outpatient follow-up with GI service recommended.   Irritable bowel syndrome constipation predominant. -Stable overall -Continue Senokot Metamucil Linzess -Advised to maintain adequate hydration.      Family Communication:   spouse updated at bedside 9/27   Consultants:  none   Code Status:  FULL    DVT Prophylaxis:  xarelto     Procedures: As Listed in Progress Note Above   Antibiotics: Vanc 9/24>> Cefepime 9/24>>   Subjective: No fever, no chest pain, no nausea, no vomiting.  Reports feeling better.  Patient off Levophed and demonstrating good rate control.   Objective: Vitals:   02/26/22 0600 02/26/22 0831 02/26/22 1136 02/26/22 1545  BP: (!) 153/69     Pulse: 75     Resp: (!) 21     Temp:  98 F (36.7 C) 97.6 F (36.4 C) 97.8 F (36.6 C)  TempSrc:  Oral Oral Oral  SpO2: 97%     Weight:      Height:        Intake/Output Summary (Last 24 hours) at 02/26/2022 1617 Last data filed at 02/26/2022 1122 Gross per 24 hour  Intake --  Output 875 ml  Net -875 ml   Weight change:  Exam: General exam: Alert, awake, oriented x 3, no chest pain, no shortness of breath, good saturation on room air.  Denies palpitation.  Feeling better.  Patient is  afebrile. Respiratory system: Good air movement bilaterally; no using accessory muscles. Cardiovascular system: Rate controlled, no rubs, no gallops, no JVD. Gastrointestinal system: Abdomen is obese, nondistended, soft and nontender. No organomegaly or masses felt. Normal bowel sounds heard. Central nervous system: Alert and oriented. No focal neurological deficits. Extremities: No cyanosis, clubbing or edema. Skin: No petechiae. Psychiatry: Judgement and insight appear normal. Mood & affect appropriate.   Data Reviewed: I have personally reviewed following labs and imaging studies  Basic Metabolic Panel: Recent Labs  Lab 02/23/22 1631 02/24/22 0449 02/25/22 0324 02/26/22 0900  NA 135 136 138 141  K 3.9 5.0 4.5 3.7  CL 100 105 110 111  CO2 '23 23 23 25  '$ GLUCOSE 110* 119* 150* 130*  BUN '17 18 23 18  '$ CREATININE 1.19 1.13 1.05 0.89  CALCIUM 9.2 8.8* 8.4* 8.4*  MG  --   --  2.0  --    Liver Function Tests: Recent Labs  Lab 02/23/22 1631  AST 24  ALT 17  ALKPHOS 68  BILITOT 1.2  PROT 7.9  ALBUMIN 4.0   Recent Labs  Lab 02/23/22 1631  LIPASE 53*   Recent Labs  Lab 02/23/22 1632  AMMONIA 13   Coagulation Profile: Recent Labs  Lab 02/24/22 0449  INR 2.1*   CBC: Recent Labs  Lab 02/23/22 1631 02/24/22 0449 02/25/22 0324 02/26/22 0900  WBC 14.6* 14.8* 13.3* 5.9  NEUTROABS 13.0*  --   --   --   HGB 13.2 11.4* 9.9* 9.6*  HCT 38.9* 35.0* 30.5* 30.3*  MCV 92.6 95.9 94.7 95.0  PLT 113* 87* 110* 97*   Cardiac Enzymes: Recent Labs  Lab 02/24/22 0927  CKTOTAL 23*   BNP: Invalid input(s): "POCBNP" CBG: Recent Labs  Lab 02/25/22 0102  GLUCAP 151*   HbA1C: No results for input(s): "HGBA1C" in the last 72 hours. Urine analysis:    Component Value Date/Time   COLORURINE YELLOW 02/23/2022 1734   APPEARANCEUR HAZY (A) 02/23/2022 1734   LABSPEC 1.014 02/23/2022 1734   PHURINE 8.0 02/23/2022 1734   GLUCOSEU NEGATIVE 02/23/2022 1734   HGBUR MODERATE  (A) 02/23/2022 1734   BILIRUBINUR NEGATIVE 02/23/2022 1734   KETONESUR 20 (A) 02/23/2022 1734   PROTEINUR 100 (A) 02/23/2022 1734   UROBILINOGEN 0.2 07/16/2012 1829   NITRITE NEGATIVE 02/23/2022 1734   LEUKOCYTESUR SMALL (A) 02/23/2022 1734   Sepsis Labs: '@LABRCNTIP'$ (procalcitonin:4,lacticidven:4) ) Recent Results (from the past 240 hour(s))  Culture, blood (routine x 2)     Status: None (Preliminary result)   Collection Time: 02/23/22  4:37 PM   Specimen: Left Antecubital; Blood  Result Value Ref Range Status   Specimen Description   Final    LEFT ANTECUBITAL BOTTLES DRAWN AEROBIC AND ANAEROBIC   Special Requests Blood Culture adequate volume  Final   Culture   Final    NO GROWTH 3 DAYS Performed at [facility], [address] [place], [place] [address]    Report Status PENDING  Incomplete  Culture, blood (routine x 2)     Status: None (Preliminary result)   Collection Time: 02/23/22  5:14 PM   Specimen: BLOOD LEFT HAND  Result Value Ref Range Status   Specimen Description BLOOD LEFT HAND BOTTLES DRAWN AEROBIC ONLY  Final   Special Requests   Final    Blood Culture results may not be optimal due to an inadequate volume of blood received in culture bottles   Culture  Final    NO GROWTH 3 DAYS Performed at [facility], [address], [place], [place] [address]    Report Status PENDING  Incomplete  Urine Culture     Status: Abnormal   Collection Time: 02/23/22  5:34 PM   Specimen: Urine, Clean Catch  Result Value Ref Range Status   Specimen Description   Final    URINE, CLEAN CATCH Performed at [facility], [address], [place], [place] [address]    Special Requests   Final    NONE Performed at [facility], [address] [place], [place] [address]    Culture (A)  Final    20,000 COLONIES/mL MULTIPLE SPECIES PRESENT, SUGGEST RECOLLECTION   Report Status 02/25/2022 FINAL  Final  SARS Coronavirus 2 by RT PCR (hospital order, performed in [facility] hospital lab)  *cepheid single result test* Anterior Nasal Swab     Status: None   Collection Time: 02/23/22  7:29 PM   Specimen: Anterior Nasal Swab  Result Value Ref Range Status   SARS Coronavirus 2 by RT PCR NEGATIVE NEGATIVE Final    Comment: (NOTE) SARS-CoV-2 target nucleic acids are NOT DETECTED.  The SARS-CoV-2 RNA is generally detectable in upper and lower respiratory specimens during the acute phase of infection. The lowest concentration of SARS-CoV-2 viral copies this assay can detect is 250 copies / mL. A negative result does not preclude SARS-CoV-2 infection and should not be used as the sole basis for treatment or other patient management decisions.  A negative result may occur with improper specimen collection / handling, submission of specimen other than nasopharyngeal swab, presence of viral mutation(s) within the areas targeted by this assay, and inadequate number of viral copies (<250 copies / mL). A negative result must be combined with clinical observations, patient history, and epidemiological information.  Fact Sheet for Patients:   https://www.patel.info/  Fact Sheet for Healthcare Providers: https://hall.com/  This test is not yet approved or  cleared by the [place] FDA and has been authorized for detection and/or diagnosis of SARS-CoV-2 by FDA under an Emergency Use Authorization (EUA).  This EUA will remain in effect (meaning this test can be used) for the duration of the COVID-19 declaration under Section 564(b)(1) of the Act, 21 U.S.C. section 360bbb-3(b)(1), unless the authorization is terminated or revoked sooner.  Performed at [facility], [address] [place], [place] [address]   MRSA Next Gen by PCR, Nasal     Status: None   Collection Time: 02/25/22 11:31 AM   Specimen: Nasal Mucosa; Nasal Swab  Result Value Ref Range Status   MRSA by PCR Next Gen NOT DETECTED NOT DETECTED Final    Comment: (NOTE) The  GeneXpert MRSA Assay (FDA approved for NASAL specimens only), is one component of a comprehensive MRSA colonization surveillance program. It is not intended to diagnose MRSA infection nor to guide or monitor treatment for MRSA infections. Test performance is not FDA approved in patients less than 19 years old. Performed at [facility], [address] [place],  [address]      Scheduled Meds:  acetaminophen  650 mg Rectal Once   calcium-vitamin D  1 tablet Oral BID   Chlorhexidine Gluconate Cloth  6 each Topical Daily   Chlorhexidine Gluconate Cloth  6 each Topical Q0600   hydrocortisone sod succinate (SOLU-CORTEF) inj  50 mg Intravenous Q12H   metoCLOPramide  10 mg Oral TID AC   metoprolol succinate  12.5 mg Oral Daily   multivitamin  with minerals  1 tablet Oral Daily   omega-3 acid ethyl esters  1 g Oral BID   pantoprazole  40 mg Oral Daily   polyethylene glycol  17 g Oral Daily   Prucalopride Succinate  1 each Oral Daily   ranolazine  500 mg Oral BID   rivaroxaban  20 mg Oral Q supper   rosuvastatin  5 mg Oral QPM   senna  1 tablet Oral Daily   Continuous Infusions:  sodium chloride     ceFEPime (MAXIPIME) IV 2 g (02/26/22 1127)   lactated ringers 125 mL/hr at 02/24/22 1817   norepinephrine (LEVOPHED) Adult infusion 2 mcg/min (02/25/22 0612)   vancomycin 1,500 mg (02/26/22 1211)    Procedures/Studies: CT CHEST WO CONTRAST  Result Date: 02/23/2022 CLINICAL DATA:  Pneumonia, complication suspected, xray done. Fall. Weakness. EXAM: CT CHEST WITHOUT CONTRAST TECHNIQUE: Multidetector CT imaging of the chest was performed following the standard protocol without IV contrast. RADIATION DOSE REDUCTION: This exam was performed according to the departmental dose-optimization program which includes automated exposure control, adjustment of the mA and/or kV according to patient size and/or use of iterative reconstruction technique. COMPARISON:  02/05/2022 CT chest, abdomen and  pelvis. Chest radiograph from earlier today. FINDINGS: Substantially motion degraded scan, limiting assessment. Cardiovascular: Stable mild cardiomegaly. No significant pericardial effusion/thickening. Three-vessel coronary atherosclerosis. Atherosclerotic thoracic aorta with dilated 4.3 cm ascending thoracic aorta. Normal caliber pulmonary arteries. Mediastinum/Nodes: Hypodense 1.3 cm right thyroid nodule, unchanged. Not clinically significant; no follow-up imaging recommended (ref: J Am Coll Radiol. 2015 Feb;12(2): 143-50). Mildly patulous thoracic esophagus with fluid in the mid to lower thoracic esophagus. No pathologically enlarged axillary, mediastinal or hilar lymph nodes, noting limited sensitivity for the detection of hilar adenopathy on this noncontrast study. Lungs/Pleura: No pneumothorax. No pleural effusion. Mild-to-moderate patchy peripheral predominantly perilobular and subpleural regions of ground-glass opacity, reticulation and minimal consolidation in both lungs with some associated parenchymal distortion, most prominent in left upper and left lower lobes. No significant regions of bronchiectasis. The ground-glass opacities and consolidation have improved since 02/05/2022 chest CT. No lung masses or significant pulmonary nodules in the aerated portions of the lungs. Upper abdomen: Small to moderate hiatal hernia. Partially visualize simple 3.9 cm upper left renal cyst, for which no follow-up imaging is recommended. Musculoskeletal: No aggressive appearing focal osseous lesions. Inferior approach spinal stimulator with lead terminating in the posterior midthoracic spinal canal. Partially visualized bilateral posterior spinal fusion hardware in the lower cervical spine. Bilateral posterior spinal fusion T2-T6 with chronic severe T4 vertebral compression fracture. Marked thoracic spondylosis. Partially visualized bilateral posterior spinal fusion hardware in the upper lumbar spine. IMPRESSION: 1.  Substantially motion degraded scan, limiting assessment. 2. Spectrum of findings compatible with improving mild-to-moderate multilobar organizing pneumonia compared to 02/05/2022 chest CT, with early evolving postinfectious scarring. 3. Stable mild cardiomegaly. Three-vessel coronary atherosclerosis. 4. Small to moderate hiatal hernia. Mildly patulous thoracic esophagus with fluid in the mid to lower thoracic esophagus, suggesting esophageal dysmotility and/or gastroesophageal reflux. 5. Dilated 4.3 cm ascending thoracic aorta. Recommend annual imaging followup by CTA or MRA. This recommendation follows 2010 ACCF/AHA/AATS/ACR/ASA/SCA/SCAI/SIR/STS/SVM Guidelines for the Diagnosis and Management of Patients with Thoracic Aortic Disease. Circulation. 2010; 121: I338-S505. Aortic aneurysm NOS (ICD10-I71.9). 6. Chronic severe T4 vertebral compression fracture. 7. Aortic Atherosclerosis (ICD10-I70.0). Electronically Signed   By: Ilona Sorrel M.D.   On: 02/23/2022 20:00   DG Chest Portable 1 View  Result Date: 02/23/2022 CLINICAL DATA:  Shortness of breath EXAM: PORTABLE CHEST 1  VIEW COMPARISON:  CT 02/05/2022, chest x-ray 02/11/2017 FINDINGS: Spinal rods and fixating screws in the cervical and thoracic spine. Partially visualized ascending thoracic stimulator leads at the lower thoracic spine. Mild heterogeneous airspace opacities, appears slightly improved as compared with chest CT from September 6. Cardiomegaly. Possible tiny pleural effusions. No visible pneumothorax IMPRESSION: 1. Cardiomegaly with possible tiny effusions 2. Residual heterogeneous bilateral airspace opacity corresponding to history of pneumonia, these appear slightly improved as compared to the chest CT from September 6. Electronically Signed   By: Donavan Foil M.D.   On: 02/23/2022 17:39   CT HEAD WO CONTRAST (5MM)  Result Date: 02/11/2022 CLINICAL DATA:  Altered mental status. EXAM: CT HEAD WITHOUT CONTRAST TECHNIQUE: Contiguous axial  images were obtained from the base of the skull through the vertex without intravenous contrast. RADIATION DOSE REDUCTION: This exam was performed according to the departmental dose-optimization program which includes automated exposure control, adjustment of the mA and/or kV according to patient size and/or use of iterative reconstruction technique. COMPARISON:  March 06, 2015 FINDINGS: Brain: There is mild cerebral atrophy with widening of the extra-axial spaces and ventricular dilatation. There are areas of decreased attenuation within the white matter tracts of the supratentorial brain, consistent with microvascular disease changes. Vascular: No hyperdense vessel or unexpected calcification. Skull: Normal. Negative for fracture or focal lesion. Sinuses/Orbits: No acute finding. Other: None. IMPRESSION: Generalized cerebral atrophy and chronic white matter small vessel ischemic changes without evidence of an acute intracranial abnormality. Electronically Signed   By: Virgina Norfolk M.D.   On: 02/11/2022 20:08   IR US CHEST  Result Date: 02/11/2022 CLINICAL DATA:  Possible pleural effusion EXAM: CHEST ULTRASOUND COMPARISON:  Chest x-ray 02/10/2022 FINDINGS: Sonographic evaluation of the abdomen demonstrates no evidence of ascites or pleural effusion. Sonographic evaluation of the chest demonstrates no evidence of pleural effusion. IMPRESSION: Negative for pleural effusion and ascites. Electronically Signed   By: Jacqulynn Cadet M.D.   On: 02/11/2022 15:49   DG Chest Port 1 View  Result Date: 02/10/2022 CLINICAL DATA:  Dyspnea EXAM: PORTABLE CHEST 1 VIEW COMPARISON:  21-Feb-2022, February 05, 2022 FINDINGS: Limited chest radiograph due to patient positioning and motion artifact. Partially visualized cervical and thoracic fusion hardware and spinal stimulator device. Widened mediastinal and cardiac contours, likely positional. Multifocal bilateral heterogeneous pulmonary opacities. Moderate left  and small right pleural effusions. No large pneumothorax. No acute osseous abnormality. The visualized upper abdomen is unremarkable. IMPRESSION: 1. Markedly limited chest radiograph due to patient positioning and motion artifact. Within this limitation, there is multifocal bilateral heterogeneous pulmonary opacities, concerning for multifocal infection. 2. Moderate left and small right pleural effusions. Electronically Signed   By: Beryle Flock M.D.   On: 02/10/2022 12:26   DG Abd Portable 1V  Result Date: 02/09/2022 CLINICAL DATA:  Abdominal pain EXAM: PORTABLE ABDOMEN - 1 VIEW COMPARISON:  Previous studies including the CT done on 2022-02-21 FINDINGS: Bowel gas pattern is nonspecific. There is residual contrast in the colon from previous studies. There is no significant small bowel dilation. Stomach is unremarkable. There is previous surgical fusion in lumbar spine. There is previous left hip arthroplasty. Pain control lead is seen in thoracic spinal canal. IMPRESSION: Nonspecific bowel gas pattern. Electronically Signed   By: Elmer Picker M.D.   On: 02/09/2022 12:32   ECHOCARDIOGRAM COMPLETE  Result Date: 02/08/2022    ECHOCARDIOGRAM REPORT   Patient Name:   RUE TINNEL [facility] Date of Exam: 02/08/2022 Medical Rec #:  [id number]  Height:       72.0 in Accession #:    [id number]     Weight:       246.9 lb Date of Birth:  1950-12-26      BSA:          2.330 m Patient Age:    46 years       BP:           90/60 mmHg Patient Gender: M              HR:           74 bpm. Exam Location:  Inpatient Procedure: 2D Echo Indications:    Atrial fibrillation  History:        Patient has prior history of Echocardiogram examinations, most                 recent 05/06/2021. Risk Factors:Hypertension and Sleep Apnea.  Sonographer:    Johny Chess RDCS Referring Phys: [id number] [place]  1. Left ventricular ejection fraction, by estimation, is 55 to 60%. The left ventricle has normal function.  The left ventricle has no regional wall motion abnormalities. There is mild left ventricular hypertrophy. Left ventricular diastolic parameters are indeterminate.  2. Right ventricular systolic function is normal. The right ventricular size is normal. Tricuspid regurgitation signal is inadequate for assessing PA pressure.  3. The mitral valve is normal in structure. Trivial mitral valve regurgitation. No evidence of mitral stenosis.  4. The aortic valve is tricuspid. There is mild calcification of the aortic valve. There is mild thickening of the aortic valve. Aortic valve regurgitation is mild.  5. Aortic dilatation noted. There is moderate dilatation of the aortic root, measuring 44 mm. There is moderate dilatation of the ascending aorta, measuring 43 mm.  6. The inferior vena cava is normal in size with greater than 50% respiratory variability, suggesting right atrial pressure of 3 mmHg. FINDINGS  Left Ventricle: Left ventricular ejection fraction, by estimation, is 55 to 60%. The left ventricle has normal function. The left ventricle has no regional wall motion abnormalities. The left ventricular internal cavity size was normal in size. There is  mild left ventricular hypertrophy. Left ventricular diastolic parameters are indeterminate. Right Ventricle: The right ventricular size is normal. Right vetricular wall thickness was not well visualized. Right ventricular systolic function is normal. Tricuspid regurgitation signal is inadequate for assessing PA pressure. Left Atrium: Left atrial size was normal in size. Right Atrium: Right atrial size was normal in size. Pericardium: There is no evidence of pericardial effusion. Mitral Valve: The mitral valve is normal in structure. Trivial mitral valve regurgitation. No evidence of mitral valve stenosis. Tricuspid Valve: The tricuspid valve is not well visualized. Tricuspid valve regurgitation is trivial. No evidence of tricuspid stenosis. Aortic Valve: The aortic  valve is tricuspid. There is mild calcification of the aortic valve. There is mild thickening of the aortic valve. There is mild aortic valve annular calcification. Aortic valve regurgitation is mild. Pulmonic Valve: The pulmonic valve was not well visualized. Pulmonic valve regurgitation is not visualized. No evidence of pulmonic stenosis. Aorta: Aortic dilatation noted. There is moderate dilatation of the aortic root, measuring 44 mm. There is moderate dilatation of the ascending aorta, measuring 43 mm. Venous: The inferior vena cava is normal in size with greater than 50% respiratory variability, suggesting right atrial pressure of 3 mmHg. IAS/Shunts: The interatrial septum was not well visualized.  LEFT VENTRICLE PLAX 2D LVIDd:  5.40 cm   Diastology LVIDs:         4.00 cm   LV e' medial:    5.87 cm/s LV PW:         1.20 cm   LV E/e' medial:  11.0 LV IVS:        1.40 cm   LV e' lateral:   8.05 cm/s LVOT diam:     2.60 cm   LV E/e' lateral: 8.0 LV SV:         63 LV SV Index:   27 LVOT Area:     5.31 cm  RIGHT VENTRICLE             IVC RV S prime:     14.30 cm/s  IVC diam: 2.70 cm TAPSE (M-mode): 2.0 cm LEFT ATRIUM             Index        RIGHT ATRIUM           Index LA diam:        4.70 cm 2.02 cm/m   RA Area:     16.40 cm LA Vol (A2C):   57.9 ml 24.85 ml/m  RA Volume:   38.70 ml  16.61 ml/m LA Vol (A4C):   79.0 ml 33.90 ml/m LA Biplane Vol: 71.3 ml 30.60 ml/m  AORTIC VALVE LVOT Vmax:   63.90 cm/s LVOT Vmean:  41.900 cm/s LVOT VTI:    0.119 m  AORTA Ao Root diam: 4.40 cm Ao Asc diam:  4.30 cm MITRAL VALVE MV Area (PHT): 3.42 cm    SHUNTS MV Decel Time: 222 msec    Systemic VTI:  0.12 m MV E velocity: 64.50 cm/s  Systemic Diam: 2.60 cm MV A velocity: 61.30 cm/s MV E/A ratio:  1.05 Carlyle Dolly MD Electronically signed by Carlyle Dolly MD Signature Date/Time: 02/08/2022/4:55:02 PM    Final    US Abdomen Limited RUQ (LIVER/GB)  Result Date: 02/05/2022 CLINICAL DATA:  Right upper quadrant  abdominal pain. History of cirrhosis. EXAM: ULTRASOUND ABDOMEN LIMITED RIGHT UPPER QUADRANT COMPARISON:  CT the chest, abdomen and pelvis-earlier same day FINDINGS: Gallbladder: Normal sonographic appearance of the gallbladder. No gallbladder wall thickening or pericholecystic stranding. No echogenic gallstones or biliary sludge. Negative sonographic Murphy's sign. Common bile duct: Diameter: Normal in size measuring 4 mm in diameter Liver: Normal echogenicity of the hepatic parenchyma however there is questioned mild nodularity of the hepatic contour (representative image 26). No discrete hepatic lesions. No intrahepatic biliary dilatation. Portal vein is patent on color Doppler imaging with normal direction of blood flow towards the liver. Other: None. IMPRESSION: Suspected mild nodularity of the hepatic contour with preservation of the hepatic parenchymal echogenicity, nonspecific though could be seen in the setting of early cirrhotic change. Correlation with LFTs is advised. Electronically Signed   By: Sandi Mariscal M.D.   On: 02/05/2022 15:20   CT CHEST ABDOMEN PELVIS W CONTRAST  Result Date: 02/05/2022 CLINICAL DATA:  Abdominal pain, weakness EXAM: CT CHEST, ABDOMEN, AND PELVIS WITH CONTRAST TECHNIQUE: Multidetector CT imaging of the chest, abdomen and pelvis was performed following the standard protocol during bolus administration of intravenous contrast. RADIATION DOSE REDUCTION: This exam was performed according to the departmental dose-optimization program which includes automated exposure control, adjustment of the mA and/or kV according to patient size and/or use of iterative reconstruction technique. CONTRAST:  146m OMNIPAQUE IOHEXOL 300 MG/ML  SOLN COMPARISON:  Previous studies including the CT abdomen and pelvis done on  01/27/2022 FINDINGS: CT CHEST FINDINGS Cardiovascular: There is homogeneous enhancement in thoracic aorta. 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  There are no intraluminal filling defects in central pulmonary artery branches. Peripheral pulmonary artery branches are not adequately visualized for evaluation. Coronary artery calcifications are seen. Mediastinum/Nodes: Unremarkable. Lungs/Pleura: Fairly extensive new patchy ground-glass infiltrates are noted in both lungs. There is minimal bilateral pleural effusions. There is no pneumothorax. Musculoskeletal: There is surgical fusion in cervical and upper thoracic spine. Pain control lead is seen in thoracic spinal canal. CT ABDOMEN PELVIS FINDINGS Hepatobiliary: There is fatty infiltration. Gallbladder is distended. There is no wall thickening. There is no fluid around the gallbladder. There is no dilation of bile ducts. Pancreas: No focal abnormalities are seen. Spleen: Spleen is enlarged measuring 16 cm in maximum diameter. Adrenals/Urinary Tract: Adrenals are unremarkable. There is no hydronephrosis. There is 4.3 cm parapelvic cyst in the upper pole of left kidney. No follow-up imaging is recommended. There are no renal or ureteral stones. Urinary bladder is unremarkable. Stomach/Bowel: Small hiatal hernia is seen. There are surgical clips in the posterior mediastinum adjacent to the hiatal hernia. Small bowel loops are not dilated. Appendix is not dilated. Scattered diverticula are seen in colon without signs of focal acute diverticulitis. Vascular/Lymphatic: Arterial calcifications are seen. Reproductive: Unremarkable. Other: There is no ascites or pneumoperitoneum. There is previous ventral hernia repair. Umbilical hernia containing fat is seen. Bilateral inguinal hernias containing fat are seen, larger on the right side. Musculoskeletal: There is previous left hip arthroplasty. Lumbar surgical fusion is seen. Decrease in height of body of T4 vertebra has not changed. No recent fracture is seen. IMPRESSION: New extensive patchy infiltrates are seen in both lungs suggesting multifocal pneumonia. Minimal  bilateral pleural effusions. Coronary artery disease. 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 Recommend annual imaging followup by CTA or MRA. This recommendation follows 2010 ACCF/AHA/AATS/ACR/ASA/SCA/SCAI/SIR/STS/SVM Guidelines for the Diagnosis and Management of Patients with Thoracic Aortic Disease. Circulation. 2010; 121: C944-H675. Aortic aneurysm NOS (ICD10-I71.9) There is no evidence of intestinal obstruction or pneumoperitoneum. Appendix is not dilated. There is no hydronephrosis. Fatty liver. Small hiatal hernia. Splenomegaly. There is parapelvic cyst in the left kidney. Diverticulosis of colon without signs of focal diverticulitis. Other findings as described in the body of the report. Electronically Signed   By: Elmer Picker M.D.   On: 02/05/2022 13:26    Barton Dubois, MD  [facility]  If 7PM-7AM, please contact night-coverage [email or web address] Password [facility] 02/26/2022, 4:17 PM   LOS: 3 days

## 2022-02-27 DIAGNOSIS — K3184 Gastroparesis: Secondary | ICD-10-CM | POA: Diagnosis not present

## 2022-02-27 DIAGNOSIS — J9601 Acute respiratory failure with hypoxia: Secondary | ICD-10-CM | POA: Diagnosis not present

## 2022-02-27 DIAGNOSIS — G9341 Metabolic encephalopathy: Secondary | ICD-10-CM | POA: Diagnosis not present

## 2022-02-27 DIAGNOSIS — R410 Disorientation, unspecified: Secondary | ICD-10-CM | POA: Diagnosis not present

## 2022-02-27 NOTE — Progress Notes (Signed)
PROGRESS NOTE  [name] GXQ:[id number] DOB: 1951/04/22 DOA: 02/23/2022 PCP: Redmond School, MD  Brief History:  71 year old male with a history of paroxysmal atrial fibrillation, NASH, hypertension, hyperlipidemia, chronic intermittent nausea and vomiting attributable to gastroparesis status post Nissen fundoplication, Barrett's esophagus, ascending aortic aneurysm, DVT/PE on Xarelto since 05/2021, coronary artery disease status post PCI, melanoma stage III, and marginal zone lymphoma with pulmonary involvement presenting with generalized weakness, decreased oral intake, and worsening confusion. Notably, the patient had a recent hospitalization at [facility] from 02/05/2022 to 02/14/2022 when he initially presented for worsening nausea and vomiting and weight loss.  It was felt the patient had a flareup of his intermittent nausea and vomiting due to his gastroparesis.  His hospitalization was complicated by aspiration pneumonia.  The patient was treated with Unasyn and discharged home with oral amox/clav which he finished the course.  His hospitalization was prolonged secondary to hospital delirium and metabolic encephalopathy as well as new onset atrial fibrillation.  He was discharged home in stable condition with home health. According to the patient's spouse, the patient had some mild confusion at the time of discharge.  However his confusion had worsened 2 days after discharge home.  He has had increasing generalized weakness and decreased oral intake.  He had a fall on the day prior to this admission, but partly attributable to his impulsiveness.  No new meds or supplements since d/c.  Pt continued to have his usual "gagging" after eating.  Review of the medical record shows that the patient recently saw [facility], Dr. 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  Plan was continued observation.  The  patient has completed treatment with 4 cycles of rituximab May 2022.  He has scheduled follow-up for 3 months. In the ED, the patient had a fever up to 103.2 F with tachycardia.  He was hemodynamically stable.  Oxygen saturation was 97% 2 L.  Lactic acid peaked at 1.4.  UA showed >50WBC.  Chest x-ray showed improving bilateral infiltrates.  CT of the chest showed groundglass opacities in the perilobular and subpleural regions primarily peripherally.  There is GGO and consolidations.  Improved when compared to 02/05/2022.  There is chronic severe T4 compression fracture.  Blood cultures and urine cultures were obtained.  The patient was started on cefepime and vancomycin.    Assessment and Plan: Sepsis -Presented with fever, tachycardia, leukocytosis, and altered mental status -Lactic acid.  1.4>> 1.3 -Secondary to UTI although PNA and bacteremia cannot be ruled out presently -PCT 0.56 -Continue to maintain adequate hydration (orally. -Continue cefepime. -blood culture--neg -urine culture--mixed organism -02/23/22 CT chest-GGO primary peripheral with minimal consolidation LLL, LUL, although these have improved compared to CT 02/05/2022 -Continue weaning of hydrocortisone; patient is off Levophed -Continue current antibiotics and follow sensitivity -Will hopefully discharge home in am.   Acute respiratory failure with hypoxia -Presented with tachypnea and hypoxia with oxygen saturation 88% on room air -02/23/2022 ABG 7.4 7/32/87/23 on 4 L -Wean oxygen as tolerated -Certainly, the patient may have continued component of aspiration pneumonitis -Concern about possible component of BOOP -Low threshold for initiating steroids if no improvement or if clinical worsening -Currently with good saturation on room air. -Continue treatment with current antibiotics (cefepime); in the setting of negative MRSA screen and further improvement vancomycin discontinued.   Pulmonary infiltrates -Concern about  possible component  of BOOP -Low threshold for initiating steroids if no improvement or if clinical worsening -Speech therapy evaluation -Patient to sit upright with oral care prior to eating -PCT 0.56 -Continue empiric cefepime. -MRSA screen negative. -Discontinue vancomycin.   Pyuria -UA >50 WBC -Follow urine culture--mix organisms -will treat as UTI as pt had dysuria and altered mental status at time of admission. -Improved mentation currently appreciated on exam. -Patient reporting no dysuria -Continue to maintain adequate hydration -Continue another 24 hours of IV cefepime; discontinue vancomycin.   Acute metabolic encephalopathy -Secondary to infectious process -Further work-up if no significant improvement -TSH 0.841 -Ammonia 13 -Continue to minimize hypnotic medications -02/25/22--spouse states mentation is improved, near baseline   Failure to thrive -Multifactorial including the patient's recent hospitalizations, deconditioning, complex medical issues and poor baseline functioning. -TSH 0.62 -I94--854 -Folic OEVO--35.0 -PT evaluation>>HHPT -Body mass index is 33.52 kg/m. -Advised to follow adequate nutrition and hydration.   New onset A-fib, paroxysmal -Diagnosed on 02/08/2022.  -Currently rate controlled; continue adjusted dose of metoprolol. -Continue metoprolol and Xarelto -Outpatient Follow-up with cardiology recommended.   Gastroparesis/inttermittent N/V History of Nissen fundoplication complicated by Barrett's esophagus -on Dexilant at home>>pantoprazole while in hospital -continue metoclopramide 10 mg tid -tolerating diet presently -Continue outpatient follow-up with GI service.   History CAD, ascending aortic aneurysm, hyperlipidemia. -Patient denies chest pain or shortness of breath -Troponins 8>>8 -02/08/22 echo EF 55 to 60%, no WMA, trivial MR  -Patient is on metoprolol, Ranexa, Xarelto and Crestor at home.  Continue medications at adjusted  doses. -Continue patient follow-up with cardiology service.   Essential hypertension -BP stable currently and rising. -Continue adjusted dose of metoprolol. -Follow-up vital signs. -Heart healthy diet discussed with patient.   Chronic anxiety -Continue PRN valium -PDMP reviewed--receives intermittent diazepam '10mg'$ , last refill #90 01/01/22--single provider--[place] -Stable mood.   Chronic pain/Opioid dependence -Continue Percocet as needed -PDMP reviewed--receives intermittent percocet 10/325, last refill #40 11/08/21--single provider--[place]   History of marginal zone lymphoma, stage III melanoma -Patient follows up with oncology at [facility], Dr. Caprice Beaver. -Finished 4 cycles rituximab May 2022 -Currently in remission as per patient report. -On hydrocortisone 20 g in a.m. and 10 mg in p.m from home (currently receiving stress IV steroids); planning to wean down soon home hydrocortisone dose at discharge. -Continue to follow-up vital signs.   DVT/PE December 2022  -Continue Xarelto.   Fatty liver -02/05/22 RUQ US--mild nodularity of the hepatic contour with preservation of the hepatic parenchymal echogenicity, nonspecific though could be seen in the setting of early cirrhotic changes. -Outpatient follow-up with GI service recommended.   Irritable bowel syndrome constipation predominant. -Stable overall -Continue Senokot Metamucil Linzess -Advised to maintain adequate hydration.      Family Communication:   spouse updated at bedside 9/27   Consultants:  none   Code Status:  FULL    DVT Prophylaxis:  xarelto     Procedures: As Listed in Progress Note Above   Antibiotics: Vanc 9/24>> 02/27/2022 Cefepime 9/24>>   Subjective: Patient hemodynamically stable, rate controlled and not reporting any nausea or vomiting.  Patient has remained afebrile.   Objective: Vitals:   02/26/22 2000 02/26/22 2032 02/27/22 0053 02/27/22 0515  BP: (!) 106/50 (!) 145/75 (!) 105/57 123/67  Pulse:  69 73 66 66  Resp: '20 18 18 18  '$ Temp:  (!) 97.5 F (36.4 C) 98.2 F (36.8 C) 98.5 F (36.9 C)  TempSrc:  Oral    SpO2: 92% 94% 92% 95%  Weight:  Height:        Intake/Output Summary (Last 24 hours) at 02/27/2022 1326 Last data filed at 02/27/2022 1235 Gross per 24 hour  Intake 1280 ml  Output 1000 ml  Net 280 ml   Weight change:   Exam: General exam: Alert, awake, oriented x 3; no overnight events.  Reports feeling much better today and expressed no dysuria. Respiratory system: Clear to auscultation. Respiratory effort normal.  Good saturation on room air. Cardiovascular system: Rate controlled, no rubs, no gallops, no JVD. Gastrointestinal system: Abdomen is nondistended, soft and nontender. No organomegaly or masses felt. Normal bowel sounds heard. Central nervous system: Alert and oriented. No focal neurological deficits. Extremities: No cyanosis or clubbing; trace pedal edema appreciated bilaterally. Skin: No petechiae. Psychiatry: Judgement and insight appear normal. Mood & affect appropriate.   Data Reviewed: I have personally reviewed following labs and imaging studies  Basic Metabolic Panel: Recent Labs  Lab 02/23/22 1631 02/24/22 0449 02/25/22 0324 02/26/22 0900  NA 135 136 138 141  K 3.9 5.0 4.5 3.7  CL 100 105 110 111  CO2 '23 23 23 25  '$ GLUCOSE 110* 119* 150* 130*  BUN '17 18 23 18  '$ CREATININE 1.19 1.13 1.05 0.89  CALCIUM 9.2 8.8* 8.4* 8.4*  MG  --   --  2.0  --    Liver Function Tests: Recent Labs  Lab 02/23/22 1631  AST 24  ALT 17  ALKPHOS 68  BILITOT 1.2  PROT 7.9  ALBUMIN 4.0   Recent Labs  Lab 02/23/22 1631  LIPASE 53*   Recent Labs  Lab 02/23/22 1632  AMMONIA 13   Coagulation Profile: Recent Labs  Lab 02/24/22 0449  INR 2.1*   CBC: Recent Labs  Lab 02/23/22 1631 02/24/22 0449 02/25/22 0324 02/26/22 0900  WBC 14.6* 14.8* 13.3* 5.9  NEUTROABS 13.0*  --   --   --   HGB 13.2 11.4* 9.9* 9.6*  HCT 38.9* 35.0* 30.5*  30.3*  MCV 92.6 95.9 94.7 95.0  PLT 113* 87* 110* 97*   Cardiac Enzymes: Recent Labs  Lab 02/24/22 0927  CKTOTAL 23*   CBG: Recent Labs  Lab 02/25/22 0102  GLUCAP 151*   Urine analysis:    Component Value Date/Time   COLORURINE YELLOW 02/23/2022 1734   APPEARANCEUR HAZY (A) 02/23/2022 1734   LABSPEC 1.014 02/23/2022 1734   PHURINE 8.0 02/23/2022 1734   GLUCOSEU NEGATIVE 02/23/2022 1734   HGBUR MODERATE (A) 02/23/2022 1734   BILIRUBINUR NEGATIVE 02/23/2022 1734   KETONESUR 20 (A) 02/23/2022 1734   PROTEINUR 100 (A) 02/23/2022 1734   UROBILINOGEN 0.2 07/16/2012 1829   NITRITE NEGATIVE 02/23/2022 1734   LEUKOCYTESUR SMALL (A) 02/23/2022 1734   Sepsis Labs:  Recent Results (from the past 240 hour(s))  Culture, blood (routine x 2)     Status: None (Preliminary result)   Collection Time: 02/23/22  4:37 PM   Specimen: Left Antecubital; Blood  Result Value Ref Range Status   Specimen Description   Final    LEFT ANTECUBITAL BOTTLES DRAWN AEROBIC AND ANAEROBIC   Special Requests Blood Culture adequate volume  Final   Culture   Final    NO GROWTH 4 DAYS Performed at [facility], [address] [place], [place] [address]    Report Status PENDING  Incomplete  Culture, blood (routine x 2)     Status: None (Preliminary result)   Collection Time: 02/23/22  5:14 PM   Specimen: BLOOD LEFT HAND  Result Value Ref Range  Status   Specimen Description BLOOD LEFT HAND BOTTLES DRAWN AEROBIC ONLY  Final   Special Requests   Final    Blood Culture results may not be optimal due to an inadequate volume of blood received in culture bottles   Culture   Final    NO GROWTH 4 DAYS Performed at [facility], [address], [place], [place] [address]    Report Status PENDING  Incomplete  Urine Culture     Status: Abnormal   Collection Time: 02/23/22  5:34 PM   Specimen: Urine, Clean Catch  Result Value Ref Range Status   Specimen Description   Final    URINE, CLEAN CATCH Performed at  [facility], [address], [place], [place] [address]    Special Requests   Final    NONE Performed at [facility], [address] [place], [place] [address]    Culture (A)  Final    20,000 COLONIES/mL MULTIPLE SPECIES PRESENT, SUGGEST RECOLLECTION   Report Status 02/25/2022 FINAL  Final  SARS Coronavirus 2 by RT PCR (hospital order, performed in [facility] hospital lab) *cepheid single result test* Anterior Nasal Swab     Status: None   Collection Time: 02/23/22  7:29 PM   Specimen: Anterior Nasal Swab  Result Value Ref Range Status   SARS Coronavirus 2 by RT PCR NEGATIVE NEGATIVE Final    Comment: (NOTE) SARS-CoV-2 target nucleic acids are NOT DETECTED.  The SARS-CoV-2 RNA is generally detectable in upper and lower respiratory specimens during the acute phase of infection. The lowest concentration of SARS-CoV-2 viral copies this assay can detect is 250 copies / mL. A negative result does not preclude SARS-CoV-2 infection and should not be used as the sole basis for treatment or other patient management decisions.  A negative result may occur with improper specimen collection / handling, submission of specimen other than nasopharyngeal swab, presence of viral mutation(s) within the areas targeted by this assay, and inadequate number of viral copies (<250 copies / mL). A negative result must be combined with clinical observations, patient history, and epidemiological information.  Fact Sheet for Patients:   https://www.patel.info/  Fact Sheet for Healthcare Providers: https://hall.com/  This test is not yet approved or  cleared by the [place] FDA and has been authorized for detection and/or diagnosis of SARS-CoV-2 by FDA under an Emergency Use Authorization (EUA).  This EUA will remain in effect (meaning this test can be used) for the duration of the COVID-19 declaration under Section 564(b)(1) of the Act, 21 U.S.C. section  360bbb-3(b)(1), unless the authorization is terminated or revoked sooner.  Performed at [facility], [address] [place], [place] [address]   MRSA Next Gen by PCR, Nasal     Status: None   Collection Time: 02/25/22 11:31 AM   Specimen: Nasal Mucosa; Nasal Swab  Result Value Ref Range Status   MRSA by PCR Next Gen NOT DETECTED NOT DETECTED Final    Comment: (NOTE) The GeneXpert MRSA Assay (FDA approved for NASAL specimens only), is one component of a comprehensive MRSA colonization surveillance program. It is not intended to diagnose MRSA infection nor to guide or monitor treatment for MRSA infections. Test performance is not FDA approved in patients less than 46 years old. Performed at [facility], [address], [place], [place] [address]      Scheduled Meds:  acetaminophen  650 mg Rectal Once   calcium-vitamin D  1 tablet Oral BID   Chlorhexidine Gluconate Cloth  6 each Topical Daily   Chlorhexidine Gluconate Cloth  6 each Topical Q0600   hydrocortisone sod succinate (SOLU-CORTEF) inj  50 mg Intravenous Q12H   metoCLOPramide  10 mg Oral TID AC   metoprolol succinate  12.5 mg Oral Daily   multivitamin with minerals  1 tablet Oral Daily   omega-3 acid ethyl esters  1 g Oral BID   pantoprazole  40 mg Oral Daily   polyethylene glycol  17 g Oral Daily   Prucalopride Succinate  1 each Oral Daily   ranolazine  500 mg Oral BID   rivaroxaban  20 mg Oral Q supper   rosuvastatin  5 mg Oral QPM   senna  1 tablet Oral Daily   Continuous Infusions:  sodium chloride     ceFEPime (MAXIPIME) IV 2 g (02/27/22 1235)    Procedures/Studies: CT CHEST WO CONTRAST  Result Date: 02/23/2022 CLINICAL DATA:  Pneumonia, complication suspected, xray done. Fall. Weakness. EXAM: CT CHEST WITHOUT CONTRAST TECHNIQUE: Multidetector CT imaging of the chest was performed following the standard protocol without IV contrast. RADIATION DOSE REDUCTION: This exam was performed according to the  departmental dose-optimization program which includes automated exposure control, adjustment of the mA and/or kV according to patient size and/or use of iterative reconstruction technique. COMPARISON:  02/05/2022 CT chest, abdomen and pelvis. Chest radiograph from earlier today. FINDINGS: Substantially motion degraded scan, limiting assessment. Cardiovascular: Stable mild cardiomegaly. No significant pericardial effusion/thickening. Three-vessel coronary atherosclerosis. Atherosclerotic thoracic aorta with dilated 4.3 cm ascending thoracic aorta. Normal caliber pulmonary arteries. Mediastinum/Nodes: Hypodense 1.3 cm right thyroid nodule, unchanged. Not clinically significant; no follow-up imaging recommended (ref: J Am Coll Radiol. 2015 Feb;12(2): 143-50). Mildly patulous thoracic esophagus with fluid in the mid to lower thoracic esophagus. No pathologically enlarged axillary, mediastinal or hilar lymph nodes, noting limited sensitivity for the detection of hilar adenopathy on this noncontrast study. Lungs/Pleura: No pneumothorax. No pleural effusion. Mild-to-moderate patchy peripheral predominantly perilobular and subpleural regions of ground-glass opacity, reticulation and minimal consolidation in both lungs with some associated parenchymal distortion, most prominent in left upper and left lower lobes. No significant regions of bronchiectasis. The ground-glass opacities and consolidation have improved since 02/05/2022 chest CT. No lung masses or significant pulmonary nodules in the aerated portions of the lungs. Upper abdomen: Small to moderate hiatal hernia. Partially visualize simple 3.9 cm upper left renal cyst, for which no follow-up imaging is recommended. Musculoskeletal: No aggressive appearing focal osseous lesions. Inferior approach spinal stimulator with lead terminating in the posterior midthoracic spinal canal. Partially visualized bilateral posterior spinal fusion hardware in the lower cervical spine.  Bilateral posterior spinal fusion T2-T6 with chronic severe T4 vertebral compression fracture. Marked thoracic spondylosis. Partially visualized bilateral posterior spinal fusion hardware in the upper lumbar spine. IMPRESSION: 1. Substantially motion degraded scan, limiting assessment. 2. Spectrum of findings compatible with improving mild-to-moderate multilobar organizing pneumonia compared to 02/05/2022 chest CT, with early evolving postinfectious scarring. 3. Stable mild cardiomegaly. Three-vessel coronary atherosclerosis. 4. Small to moderate hiatal hernia. Mildly patulous thoracic esophagus with fluid in the mid to lower thoracic esophagus, suggesting esophageal dysmotility and/or gastroesophageal reflux. 5. Dilated 4.3 cm ascending thoracic aorta. Recommend annual imaging followup by CTA or MRA. This recommendation follows 2010 ACCF/AHA/AATS/ACR/ASA/SCA/SCAI/SIR/STS/SVM Guidelines for the Diagnosis and Management of Patients with Thoracic Aortic Disease. Circulation. 2010; 121: L875-I433. Aortic aneurysm NOS (ICD10-I71.9). 6. Chronic severe T4 vertebral compression fracture. 7. Aortic Atherosclerosis (ICD10-I70.0). Electronically Signed   By: Ilona Sorrel M.D.   On: 02/23/2022 20:00  DG Chest Portable 1 View  Result Date: 02/23/2022 CLINICAL DATA:  Shortness of breath EXAM: PORTABLE CHEST 1 VIEW COMPARISON:  CT 02/05/2022, chest x-ray 02/11/2017 FINDINGS: Spinal rods and fixating screws in the cervical and thoracic spine. Partially visualized ascending thoracic stimulator leads at the lower thoracic spine. Mild heterogeneous airspace opacities, appears slightly improved as compared with chest CT from September 6. Cardiomegaly. Possible tiny pleural effusions. No visible pneumothorax IMPRESSION: 1. Cardiomegaly with possible tiny effusions 2. Residual heterogeneous bilateral airspace opacity corresponding to history of pneumonia, these appear slightly improved as compared to the chest CT from September 6.  Electronically Signed   By: Donavan Foil M.D.   On: 02/23/2022 17:39   CT HEAD WO CONTRAST (5MM)  Result Date: 02/11/2022 CLINICAL DATA:  Altered mental status. EXAM: CT HEAD WITHOUT CONTRAST TECHNIQUE: Contiguous axial images were obtained from the base of the skull through the vertex without intravenous contrast. RADIATION DOSE REDUCTION: This exam was performed according to the departmental dose-optimization program which includes automated exposure control, adjustment of the mA and/or kV according to patient size and/or use of iterative reconstruction technique. COMPARISON:  March 06, 2015 FINDINGS: Brain: There is mild cerebral atrophy with widening of the extra-axial spaces and ventricular dilatation. There are areas of decreased attenuation within the white matter tracts of the supratentorial brain, consistent with microvascular disease changes. Vascular: No hyperdense vessel or unexpected calcification. Skull: Normal. Negative for fracture or focal lesion. Sinuses/Orbits: No acute finding. Other: None. IMPRESSION: Generalized cerebral atrophy and chronic white matter small vessel ischemic changes without evidence of an acute intracranial abnormality. Electronically Signed   By: Virgina Norfolk M.D.   On: 02/11/2022 20:08   IR US CHEST  Result Date: 02/11/2022 CLINICAL DATA:  Possible pleural effusion EXAM: CHEST ULTRASOUND COMPARISON:  Chest x-ray 02/10/2022 FINDINGS: Sonographic evaluation of the abdomen demonstrates no evidence of ascites or pleural effusion. Sonographic evaluation of the chest demonstrates no evidence of pleural effusion. IMPRESSION: Negative for pleural effusion and ascites. Electronically Signed   By: Jacqulynn Cadet M.D.   On: 02/11/2022 15:49   DG Chest Port 1 View  Result Date: 02/10/2022 CLINICAL DATA:  Dyspnea EXAM: PORTABLE CHEST 1 VIEW COMPARISON:  01/30/22, February 05, 2022 FINDINGS: Limited chest radiograph due to patient positioning and motion  artifact. Partially visualized cervical and thoracic fusion hardware and spinal stimulator device. Widened mediastinal and cardiac contours, likely positional. Multifocal bilateral heterogeneous pulmonary opacities. Moderate left and small right pleural effusions. No large pneumothorax. No acute osseous abnormality. The visualized upper abdomen is unremarkable. IMPRESSION: 1. Markedly limited chest radiograph due to patient positioning and motion artifact. Within this limitation, there is multifocal bilateral heterogeneous pulmonary opacities, concerning for multifocal infection. 2. Moderate left and small right pleural effusions. Electronically Signed   By: Beryle Flock M.D.   On: 02/10/2022 12:26   DG Abd Portable 1V  Result Date: 02/09/2022 CLINICAL DATA:  Abdominal pain EXAM: PORTABLE ABDOMEN - 1 VIEW COMPARISON:  Previous studies including the CT done on 2022-01-30 FINDINGS: Bowel gas pattern is nonspecific. There is residual contrast in the colon from previous studies. There is no significant small bowel dilation. Stomach is unremarkable. There is previous surgical fusion in lumbar spine. There is previous left hip arthroplasty. Pain control lead is seen in thoracic spinal canal. IMPRESSION: Nonspecific bowel gas pattern. Electronically Signed   By: Elmer Picker M.D.   On: 02/09/2022 12:32   ECHOCARDIOGRAM COMPLETE  Result Date: 02/08/2022    ECHOCARDIOGRAM  REPORT   Patient Name:   ISRRAEL FLUCKIGER [facility] Date of Exam: 02/08/2022 Medical Rec #:  [id number]      Height:       72.0 in Accession #:    [id number]     Weight:       246.9 lb Date of Birth:  [birth year]      BSA:          2.330 m Patient Age:    56 years       BP:           90/60 mmHg Patient Gender: M              HR:           74 bpm. Exam Location:  Inpatient Procedure: 2D Echo Indications:    Atrial fibrillation  History:        Patient has prior history of Echocardiogram examinations, most                 recent 05/06/2021. Risk  Factors:Hypertension and Sleep Apnea.  Sonographer:    Johny Chess RDCS Referring Phys: [id number] [place]  1. Left ventricular ejection fraction, by estimation, is 55 to 60%. The left ventricle has normal function. The left ventricle has no regional wall motion abnormalities. There is mild left ventricular hypertrophy. Left ventricular diastolic parameters are indeterminate.  2. Right ventricular systolic function is normal. The right ventricular size is normal. Tricuspid regurgitation signal is inadequate for assessing PA pressure.  3. The mitral valve is normal in structure. Trivial mitral valve regurgitation. No evidence of mitral stenosis.  4. The aortic valve is tricuspid. There is mild calcification of the aortic valve. There is mild thickening of the aortic valve. Aortic valve regurgitation is mild.  5. Aortic dilatation noted. There is moderate dilatation of the aortic root, measuring 44 mm. There is moderate dilatation of the ascending aorta, measuring 43 mm.  6. The inferior vena cava is normal in size with greater than 50% respiratory variability, suggesting right atrial pressure of 3 mmHg. FINDINGS  Left Ventricle: Left ventricular ejection fraction, by estimation, is 55 to 60%. The left ventricle has normal function. The left ventricle has no regional wall motion abnormalities. The left ventricular internal cavity size was normal in size. There is  mild left ventricular hypertrophy. Left ventricular diastolic parameters are indeterminate. Right Ventricle: The right ventricular size is normal. Right vetricular wall thickness was not well visualized. Right ventricular systolic function is normal. Tricuspid regurgitation signal is inadequate for assessing PA pressure. Left Atrium: Left atrial size was normal in size. Right Atrium: Right atrial size was normal in size. Pericardium: There is no evidence of pericardial effusion. Mitral Valve: The mitral valve is normal in structure.  Trivial mitral valve regurgitation. No evidence of mitral valve stenosis. Tricuspid Valve: The tricuspid valve is not well visualized. Tricuspid valve regurgitation is trivial. No evidence of tricuspid stenosis. Aortic Valve: The aortic valve is tricuspid. There is mild calcification of the aortic valve. There is mild thickening of the aortic valve. There is mild aortic valve annular calcification. Aortic valve regurgitation is mild. Pulmonic Valve: The pulmonic valve was not well visualized. Pulmonic valve regurgitation is not visualized. No evidence of pulmonic stenosis. Aorta: Aortic dilatation noted. There is moderate dilatation of the aortic root, measuring 44 mm. There is moderate dilatation of the ascending aorta, measuring 43 mm. Venous: The inferior vena cava is normal in size with greater than 50% respiratory variability,  suggesting right atrial pressure of 3 mmHg. IAS/Shunts: The interatrial septum was not well visualized.  LEFT VENTRICLE PLAX 2D LVIDd:         5.40 cm   Diastology LVIDs:         4.00 cm   LV e' medial:    5.87 cm/s LV PW:         1.20 cm   LV E/e' medial:  11.0 LV IVS:        1.40 cm   LV e' lateral:   8.05 cm/s LVOT diam:     2.60 cm   LV E/e' lateral: 8.0 LV SV:         63 LV SV Index:   27 LVOT Area:     5.31 cm  RIGHT VENTRICLE             IVC RV S prime:     14.30 cm/s  IVC diam: 2.70 cm TAPSE (M-mode): 2.0 cm LEFT ATRIUM             Index        RIGHT ATRIUM           Index LA diam:        4.70 cm 2.02 cm/m   RA Area:     16.40 cm LA Vol (A2C):   57.9 ml 24.85 ml/m  RA Volume:   38.70 ml  16.61 ml/m LA Vol (A4C):   79.0 ml 33.90 ml/m LA Biplane Vol: 71.3 ml 30.60 ml/m  AORTIC VALVE LVOT Vmax:   63.90 cm/s LVOT Vmean:  41.900 cm/s LVOT VTI:    0.119 m  AORTA Ao Root diam: 4.40 cm Ao Asc diam:  4.30 cm MITRAL VALVE MV Area (PHT): 3.42 cm    SHUNTS MV Decel Time: 222 msec    Systemic VTI:  0.12 m MV E velocity: 64.50 cm/s  Systemic Diam: 2.60 cm MV A velocity: 61.30 cm/s MV  E/A ratio:  1.05 Carlyle Dolly MD Electronically signed by Carlyle Dolly MD Signature Date/Time: 02/08/2022/4:55:02 PM    Final    US Abdomen Limited RUQ (LIVER/GB)  Result Date: 02/05/2022 CLINICAL DATA:  Right upper quadrant abdominal pain. History of cirrhosis. EXAM: ULTRASOUND ABDOMEN LIMITED RIGHT UPPER QUADRANT COMPARISON:  CT the chest, abdomen and pelvis-earlier same day FINDINGS: Gallbladder: Normal sonographic appearance of the gallbladder. No gallbladder wall thickening or pericholecystic stranding. No echogenic gallstones or biliary sludge. Negative sonographic Murphy's sign. Common bile duct: Diameter: Normal in size measuring 4 mm in diameter Liver: Normal echogenicity of the hepatic parenchyma however there is questioned mild nodularity of the hepatic contour (representative image 26). No discrete hepatic lesions. No intrahepatic biliary dilatation. Portal vein is patent on color Doppler imaging with normal direction of blood flow towards the liver. Other: None. IMPRESSION: Suspected mild nodularity of the hepatic contour with preservation of the hepatic parenchymal echogenicity, nonspecific though could be seen in the setting of early cirrhotic change. Correlation with LFTs is advised. Electronically Signed   By: Sandi Mariscal M.D.   On: 02/05/2022 15:20   CT CHEST ABDOMEN PELVIS W CONTRAST  Result Date: 02/05/2022 CLINICAL DATA:  Abdominal pain, weakness EXAM: CT CHEST, ABDOMEN, AND PELVIS WITH CONTRAST TECHNIQUE: Multidetector CT imaging of the chest, abdomen and pelvis was performed following the standard protocol during bolus administration of intravenous contrast. RADIATION DOSE REDUCTION: This exam was performed according to the departmental dose-optimization program which includes automated exposure control, adjustment of the mA and/or kV according to  patient size and/or use of iterative reconstruction technique. CONTRAST:  193m OMNIPAQUE IOHEXOL 300 MG/ML  SOLN COMPARISON:  Previous  studies including the CT abdomen and pelvis done on 01/27/2022 FINDINGS: CT CHEST FINDINGS Cardiovascular: There is homogeneous enhancement in thoracic aorta. 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 There are no intraluminal filling defects in central pulmonary artery branches. Peripheral pulmonary artery branches are not adequately visualized for evaluation. Coronary artery calcifications are seen. Mediastinum/Nodes: Unremarkable. Lungs/Pleura: Fairly extensive new patchy ground-glass infiltrates are noted in both lungs. There is minimal bilateral pleural effusions. There is no pneumothorax. Musculoskeletal: There is surgical fusion in cervical and upper thoracic spine. Pain control lead is seen in thoracic spinal canal. CT ABDOMEN PELVIS FINDINGS Hepatobiliary: There is fatty infiltration. Gallbladder is distended. There is no wall thickening. There is no fluid around the gallbladder. There is no dilation of bile ducts. Pancreas: No focal abnormalities are seen. Spleen: Spleen is enlarged measuring 16 cm in maximum diameter. Adrenals/Urinary Tract: Adrenals are unremarkable. There is no hydronephrosis. There is 4.3 cm parapelvic cyst in the upper pole of left kidney. No follow-up imaging is recommended. There are no renal or ureteral stones. Urinary bladder is unremarkable. Stomach/Bowel: Small hiatal hernia is seen. There are surgical clips in the posterior mediastinum adjacent to the hiatal hernia. Small bowel loops are not dilated. Appendix is not dilated. Scattered diverticula are seen in colon without signs of focal acute diverticulitis. Vascular/Lymphatic: Arterial calcifications are seen. Reproductive: Unremarkable. Other: There is no ascites or pneumoperitoneum. There is previous ventral hernia repair. Umbilical hernia containing fat is seen. Bilateral inguinal hernias containing fat are seen, larger on the right side. Musculoskeletal: There is previous left hip  arthroplasty. Lumbar surgical fusion is seen. Decrease in height of body of T4 vertebra has not changed. No recent fracture is seen. IMPRESSION: New extensive patchy infiltrates are seen in both lungs suggesting multifocal pneumonia. Minimal bilateral pleural effusions. Coronary artery disease. 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 Recommend annual imaging followup by CTA or MRA. This recommendation follows 2010 ACCF/AHA/AATS/ACR/ASA/SCA/SCAI/SIR/STS/SVM Guidelines for the Diagnosis and Management of Patients with Thoracic Aortic Disease. Circulation. 2010; 121:: S287-G811 Aortic aneurysm NOS (ICD10-I71.9) There is no evidence of intestinal obstruction or pneumoperitoneum. Appendix is not dilated. There is no hydronephrosis. Fatty liver. Small hiatal hernia. Splenomegaly. There is parapelvic cyst in the left kidney. Diverticulosis of colon without signs of focal diverticulitis. Other findings as described in the body of the report. Electronically Signed   By: PElmer PickerM.D.   On: 02/05/2022 13:26    CBarton Dubois MD  [facility]  If 7PM-7AM, please contact night-coverage [email or web address] Password [email or web address] 02/27/2022, 1:26 PM   LOS: 4 days

## 2022-02-27 NOTE — Progress Notes (Signed)
Pharmacy Antibiotic Note  [name] is a 71 y.o. male admitted on 02/23/2022 with  unknown source of infection .  Pharmacy has been consulted for  cefepime dosing.  Plan:  Cefepime 2000 mg IV every 8 hours. Likely transition to PO antibiotics on 9/29 Monitor labs, c/s, and vanco level as indicated.  Height: 6' (182.9 cm) Weight: 112.1 kg (247 lb 2.2 oz) IBW/kg (Calculated) : 77.6  Temp (24hrs), Avg:97.9 F (36.6 C), Min:97.5 F (36.4 C), Max:98.5 F (36.9 C)  Recent Labs  Lab 02/23/22 1631 02/23/22 1903 02/24/22 0449 02/25/22 0324 02/26/22 0900  WBC 14.6*  --  14.8* 13.3* 5.9  CREATININE 1.19  --  1.13 1.05 0.89  LATICACIDVEN 1.4 1.3  --   --   --      Estimated Creatinine Clearance: 99.8 mL/min (by C-G formula based on SCr of 0.89 mg/dL).    Allergies  Allergen Reactions   Demerol Other (See Comments)    "Makes me crazy."    Antimicrobials this admission: Vanco 9/24 >>9/26 Cefepime 9/24 >> Flagyl 9/24 >>9/26  Microbiology results: 9/24 BCx: ngtd 9/24 UCx: multiple species MRSA PCR: neg   Thank you for allowing pharmacy to be a part of this patient's care.  Ramond Craver 02/27/2022 10:11 AM

## 2022-02-27 NOTE — Care Management Important Message (Signed)
Important Message  Patient Details  Name: [name] MRN: [id number] Date of Birth: 03/28/1951   Medicare Important Message Given:  Yes     Tommy Medal 02/27/2022, 11:21 AM

## 2022-02-28 DIAGNOSIS — J189 Pneumonia, unspecified organism: Secondary | ICD-10-CM | POA: Diagnosis not present

## 2022-02-28 DIAGNOSIS — J9601 Acute respiratory failure with hypoxia: Secondary | ICD-10-CM | POA: Diagnosis not present

## 2022-02-28 DIAGNOSIS — G9341 Metabolic encephalopathy: Secondary | ICD-10-CM | POA: Diagnosis not present

## 2022-02-28 DIAGNOSIS — R410 Disorientation, unspecified: Secondary | ICD-10-CM | POA: Diagnosis not present

## 2022-02-28 LAB — CULTURE, BLOOD (ROUTINE X 2)
Culture: NO GROWTH
Culture: NO GROWTH
Special Requests: ADEQUATE

## 2022-02-28 LAB — CREATININE, SERUM
Creatinine, Ser: 0.92 mg/dL (ref 0.61–1.24)
GFR, Estimated: >60 mL/min (ref >60)

## 2022-02-28 MED ORDER — CEFDINIR 300 MG PO CAPS: 300.0000 mg | ORAL_CAPSULE | Freq: Two times a day (BID) | ORAL | 0 refills | Status: AC

## 2022-02-28 MED ORDER — HYDROCORTISONE 10 MG PO TABS: ORAL_TABLET | ORAL | 2 refills | Status: DC

## 2022-02-28 MED ORDER — METOPROLOL SUCCINATE ER 25 MG PO TB24: 12.5000 mg | ORAL_TABLET | Freq: Every day | ORAL | 1 refills | Status: DC

## 2022-02-28 NOTE — Progress Notes (Signed)
Patient discharged with all belongings. Verbally expressed understanding of discharge orders with spouse at bedside. Patient in stable condition at time of discharge.

## 2022-02-28 NOTE — Care Management Important Message (Signed)
Important Message  Patient Details  Name: [name] MRN: [id number] Date of Birth: April 08, 1951   Medicare Important Message Given:  Yes     Tommy Medal 02/28/2022, 11:19 AM

## 2022-02-28 NOTE — TOC Transition Note (Signed)
Transition of Care [facility]) - CM/SW Discharge Note   Patient Details  Name: [name] MRN: [id number] Date of Birth: 20-May-1951  Transition of Care [facility] CM/SW Contact:  Iona Beard, [place] Phone Number: 02/28/2022, 11:27 AM   Clinical Narrative:    CSW updated that pt is medically stable for D/C. CSW spoke with pts spouse to confirm pt is currently active with [facility] and they would like to continue these services. HH orders have been placed. CSW updated Vaughan Basta with [facility] of plan for D/C today. [place] agency info added to pts AVS. TOC signing off.   Final next level of care: [place] Barriers to Discharge: Barriers Resolved   Patient Goals and CMS Choice Patient states their goals for this hospitalization and ongoing recovery are:: return home      Discharge Placement                       Discharge Plan and Services In-house Referral: Clinical Social Work   Post Acute Care Choice: Resumption of Svcs/PTA Provider                    HH Arranged: PT [place] Agency: [place] ([place]) Date [place]: 02/28/22   Representative spoke with at [place]: [place] ([place]) Interventions     Readmission Risk Interventions    02/24/2022    2:01 PM  Readmission Risk Prevention Plan  Transportation Screening Complete  HRI or [place] Complete  Social Work Consult for [place] Planning/Counseling Complete  Palliative Care Screening Not Applicable  Medication Review [employer]) Complete

## 2022-02-28 NOTE — Discharge Summary (Signed)
Physician Discharge Summary   Patient: [name] MRN: [id number] DOB: 1951-03-09  Admit date:     02/23/2022  Discharge date: 02/28/22  Discharge Physician: Barton Dubois   PCP: Redmond School, MD   Recommendations at discharge:  Repeat basic metabolic panel to follow electrolytes renal function Reassess blood pressure and adjust antihypertensive treatment as needed Repeat chest x-ray in a weeks to assure complete resolution of infiltrates. Repeat CBC to follow hemoglobin/platelets count stability and trend.  Discharge Diagnoses: Principal Problem:   Acute encephalopathy Active Problems:   GERD (gastroesophageal reflux disease)   Thrombocytopenia (HCC)   Gastroparesis   Marginal zone lymphoma of spleen (HCC)   Acute respiratory failure with hypoxia (HCC)   Paroxysmal atrial fibrillation (HCC)   Sepsis due to undetermined organism ([place])   Acute metabolic encephalopathy   Delirium   Hospital Course: 71 year old male with a history of paroxysmal atrial fibrillation, NASH, hypertension, hyperlipidemia, chronic intermittent nausea and vomiting attributable to gastroparesis status post Nissen fundoplication, Barrett's esophagus, ascending aortic aneurysm, DVT/PE on Xarelto since 05/2021, coronary artery disease status post PCI, melanoma stage III, and marginal zone lymphoma with pulmonary involvement presenting with generalized weakness, decreased oral intake, and worsening confusion. Notably, the patient had a recent hospitalization at [facility] from 02/05/2022 to 02/14/2022 when he initially presented for worsening nausea and vomiting and weight loss.  It was felt the patient had a flareup of his intermittent nausea and vomiting due to his gastroparesis.  His hospitalization was complicated by aspiration pneumonia.  The patient was treated with Unasyn and discharged home with oral amox/clav which he finished the course.  His hospitalization was prolonged secondary to hospital delirium  and metabolic encephalopathy as well as new onset atrial fibrillation.  He was discharged home in stable condition with home health. According to the patient's spouse, the patient had some mild confusion at the time of discharge.  However his confusion had worsened 2 days after discharge home.  He has had increasing generalized weakness and decreased oral intake.  He had a fall on the day prior to this admission, but partly attributable to his impulsiveness.  No new meds or supplements since d/c.  Pt continued to have his usual "gagging" after eating.  Review of the medical record shows that the patient recently saw [facility], Dr. 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  Plan was continued observation.  The patient has completed treatment with 4 cycles of rituximab May 2022.  He has scheduled follow-up for 3 months. In the ED, the patient had a fever up to 103.2 F with tachycardia.  He was hemodynamically stable.  Oxygen saturation was 97% 2 L.  Lactic acid peaked at 1.4.  UA showed >50WBC.  Chest x-ray showed improving bilateral infiltrates.  CT of the chest showed groundglass opacities in the perilobular and subpleural regions primarily peripherally.  There is GGO and consolidations.  Improved when compared to 02/05/2022.  There is chronic severe T4 compression fracture.  Blood cultures and urine cultures were obtained.  The patient was started on cefepime and vancomycin.  Assessment and Plan: Sepsis -Presented with fever, tachycardia, leukocytosis, and altered mental status -Lactic acid.  1.4>> 1.3 -Secondary to UTI, although pneumonia/BOOP high concern. -PCT 0.56 -Continue IV fluids -Excellent response to treatment with vancomycin and cefepime -blood culture--neg -urine culture--mixed organism -02/23/22 CT chest-GGO primary peripheral with minimal consolidation LLL, LUL, although these have improved compared  to CT 02/05/2022 -Continue home dose hydrocortisone at discharge -Antibiotics transition to oral cefdinir twice a day for 5 more days at discharge. -Patient advised to maintain adequate hydration. -Repeat chest x-ray as an outpatient to follow complete resolution of infiltrates. -Overall sepsis features resolved at time of discharge   Acute respiratory failure with hypoxia -Presented with tachypnea and hypoxia with oxygen saturation 88% on room air -02/23/2022 ABG 7.4 7/32/87/23 on 4 L -Oxygen supplementation not required at time of discharge. -Certainly, the patient may have continued component of aspiration pneumonitis. -Concern about possible component of BOOP -Currently with good saturation on room air. -Complete antibiotic therapy as instructed.   Pulmonary infiltrates -Concern about possible component of BOOP -Speech therapy evaluation appreciated.  Patient tolerating well regular diet with thin liquids. -Excellent response to the use of cefepime and vancomycin throughout hospitalization -At discharge not requiring oxygen supplementation and will complete oral cefdinir twice a day as part of antibiotics regimen. -Repeat chest x-ray in 8 weeks to assure complete resolution of infiltrates.   Pyuria -UA >50 WBC -Follow urine culture--mix organisms -will treat as UTI as pt had dysuria and altered mental status at time of admission. -Improved mentation and currently back to baseline.   Acute metabolic encephalopathy -Secondary to infectious process -Further work-up if no significant improvement -TSH 0.841 -Ammonia 13 -Minimize hypnotic medications -02/25/22--spouse states mentation is improved, near baseline   Failure to thrive -Multifactorial including the patient's recent hospitalizations, deconditioning, complex medical issues and poor baseline functioning. -TSH 2.70 -W23--762 -Folic GBTD--17.6 -PT evaluation>>HHPT -Body mass index is 33.52 kg/m. -Advised to follow  adequate nutrition and hydration.   New onset A-fib, paroxysmal -Diagnosed on 02/08/2022.  -Currently rate controlled; -Continue metoprolol and Xarelto -Outpatient Follow-up with cardiology recommended. -Continue adjusted dose of metoprolol.   Gastroparesis/inttermittent N/V History of Nissen fundoplication complicated by Barrett's esophagus -Resume home Dexilant, Reglan and as needed antiemetics. -Continue patient follow-up with GI service.   History CAD, ascending aortic aneurysm, hyperlipidemia. -Patient denies chest pain or shortness of breath -Troponins 8>>8 -02/08/22 echo EF 55 to 60%, no WMA, trivial MR  -Patient is on metoprolol, Ranexa, Xarelto and Crestor at home.  Continue medications at adjusted doses. -Outpatient follow-up with cardiology service recommended.   Essential hypertension -BP stable and rising currently. -Resume home dose lisinopril and adjusted dose of metoprolol. -Heart healthy diet discussed with patient.   Chronic anxiety -Continue PRN valium -PDMP reviewed--receives intermittent diazepam '10mg'$ , last refill #90 01/01/22--single provider--[facility] -Stable mood.   Chronic pain/Opioid dependence -Continue Percocet as needed -PDMP reviewed--receives intermittent percocet 10/325, last refill #40 11/08/21--single provider--[facility]   History of marginal zone lymphoma, stage III melanoma -Patient follows up with oncology at [facility], Dr. Caprice Beaver. -Finished 4 cycles rituximab May 2022 -Currently in remission as per patient and family report. -Resume home hydrocortisone 20 g in a.m. and 10 mg in p.m   Consultants: None Procedures performed: See below for x-ray reports. Disposition: Home with home health services. Diet recommendation: Heart healthy diet.  DISCHARGE MEDICATION: Allergies as of 02/28/2022       Reactions   Demerol Other (See Comments)   "Makes me crazy."        Medication List     STOP taking these medications    cyclobenzaprine 10 MG  tablet Commonly known as: FLEXERIL       TAKE these medications    Calcium Carbonate-Vitamin D 600-10 MG-MCG Tabs Take 1 tablet by mouth 2 (two) times daily.   cefdinir 300 MG capsule Commonly  known as: OMNICEF Take 1 capsule (300 mg total) by mouth 2 (two) times daily for 5 days.   Dexilant 60 MG capsule Generic drug: dexlansoprazole Take 60 mg by mouth daily.   diazepam 10 MG tablet Commonly known as: VALIUM Take 10 mg by mouth 4 (four) times daily as needed (for back pain).   Ester-C 500-550 MG Tabs Take 1 tablet by mouth daily.   fexofenadine 180 MG tablet Commonly known as: ALLEGRA Take 180 mg by mouth every evening.   fish oil-omega-3 fatty acids 1000 MG capsule Take 1 g by mouth 2 (two) times daily.   hydrocortisone 10 MG tablet Commonly known as: CORTEF Take '20mg'$  in am and 10 mg PM.   lisinopril 10 MG tablet Commonly known as: ZESTRIL Take 10 mg by mouth daily.   Metamucil Fiber Chew Chew 1 each by mouth daily.   metoCLOPramide 10 MG tablet Commonly known as: REGLAN Take 1 tablet by mouth in the morning and at bedtime.   metoprolol succinate 25 MG 24 hr tablet Commonly known as: TOPROL-XL Take 0.5 tablets (12.5 mg total) by mouth daily. Start taking on: March 01, 2022 What changed: how much to take   montelukast 10 MG tablet Commonly known as: SINGULAIR Take 10 mg by mouth daily.   Motegrity 2 MG Tabs Generic drug: Prucalopride Succinate Take 1 tablet (2 mg total) by mouth daily.   multivitamin with minerals Tabs tablet Take 1 tablet by mouth daily.   ondansetron 8 MG tablet Commonly known as: ZOFRAN Take 1 tablet (8 mg total) by mouth every 8 (eight) hours as needed for vomiting or nausea.   oxyCODONE-acetaminophen 10-325 MG tablet Commonly known as: PERCOCET Take 1 tablet by mouth every 6 (six) hours as needed for pain.   PROBIOTIC PO Take 1 tablet by mouth daily. culterelle   ranolazine 500 MG 12 hr tablet Commonly known as:  RANEXA Take 500 mg by mouth 2 (two) times daily.   rivaroxaban 20 MG Tabs tablet Commonly known as: XARELTO Take 1 tablet (20 mg total) by mouth daily with supper.   rosuvastatin 10 MG tablet Commonly known as: CRESTOR Take 5 mg by mouth every evening.        Follow-up Information     [facility] Follow up.   Why: Home health agency will call to set up first visit.               Discharge Exam: Filed Weights   02/23/22 2231 02/24/22 0500 02/24/22 2300  Weight: 110.8 kg 110.8 kg 112.1 kg   General exam: Alert, awake, oriented x 3, no chest pain, no shortness of breath, good saturation on room air.  Denies palpitation.  Feeling better.  Patient is afebrile. Respiratory system: Good air movement bilaterally; no using accessory muscles. Cardiovascular system: Rate controlled, no rubs, no gallops, no JVD. Gastrointestinal system: Abdomen is obese, nondistended, soft and nontender. No organomegaly or masses felt. Normal bowel sounds heard. Central nervous system: Alert and oriented. No focal neurological deficits. Extremities: No cyanosis, clubbing or edema. Skin: No petechiae. Psychiatry: Judgement and insight appear normal. Mood & affect appropriate.   Condition at discharge: Stable and improved.  The results of significant diagnostics from this hospitalization (including imaging, microbiology, ancillary and laboratory) are listed below for reference.   Imaging Studies: CT CHEST WO CONTRAST  Result Date: 02/23/2022 CLINICAL DATA:  Pneumonia, complication suspected, xray done. Fall. Weakness. EXAM: CT CHEST WITHOUT CONTRAST TECHNIQUE: Multidetector CT imaging of the chest  was performed following the standard protocol without IV contrast. RADIATION DOSE REDUCTION: This exam was performed according to the departmental dose-optimization program which includes automated exposure control, adjustment of the mA and/or kV according to patient size and/or use of iterative  reconstruction technique. COMPARISON:  02/05/2022 CT chest, abdomen and pelvis. Chest radiograph from earlier today. FINDINGS: Substantially motion degraded scan, limiting assessment. Cardiovascular: Stable mild cardiomegaly. No significant pericardial effusion/thickening. Three-vessel coronary atherosclerosis. Atherosclerotic thoracic aorta with dilated 4.3 cm ascending thoracic aorta. Normal caliber pulmonary arteries. Mediastinum/Nodes: Hypodense 1.3 cm right thyroid nodule, unchanged. Not clinically significant; no follow-up imaging recommended (ref: J Am Coll Radiol. 2015 Feb;12(2): 143-50). Mildly patulous thoracic esophagus with fluid in the mid to lower thoracic esophagus. No pathologically enlarged axillary, mediastinal or hilar lymph nodes, noting limited sensitivity for the detection of hilar adenopathy on this noncontrast study. Lungs/Pleura: No pneumothorax. No pleural effusion. Mild-to-moderate patchy peripheral predominantly perilobular and subpleural regions of ground-glass opacity, reticulation and minimal consolidation in both lungs with some associated parenchymal distortion, most prominent in left upper and left lower lobes. No significant regions of bronchiectasis. The ground-glass opacities and consolidation have improved since 02/05/2022 chest CT. No lung masses or significant pulmonary nodules in the aerated portions of the lungs. Upper abdomen: Small to moderate hiatal hernia. Partially visualize simple 3.9 cm upper left renal cyst, for which no follow-up imaging is recommended. Musculoskeletal: No aggressive appearing focal osseous lesions. Inferior approach spinal stimulator with lead terminating in the posterior midthoracic spinal canal. Partially visualized bilateral posterior spinal fusion hardware in the lower cervical spine. Bilateral posterior spinal fusion T2-T6 with chronic severe T4 vertebral compression fracture. Marked thoracic spondylosis. Partially visualized bilateral  posterior spinal fusion hardware in the upper lumbar spine. IMPRESSION: 1. Substantially motion degraded scan, limiting assessment. 2. Spectrum of findings compatible with improving mild-to-moderate multilobar organizing pneumonia compared to 02/05/2022 chest CT, with early evolving postinfectious scarring. 3. Stable mild cardiomegaly. Three-vessel coronary atherosclerosis. 4. Small to moderate hiatal hernia. Mildly patulous thoracic esophagus with fluid in the mid to lower thoracic esophagus, suggesting esophageal dysmotility and/or gastroesophageal reflux. 5. Dilated 4.3 cm ascending thoracic aorta. Recommend annual imaging followup by CTA or MRA. This recommendation follows 2010 ACCF/AHA/AATS/ACR/ASA/SCA/SCAI/SIR/STS/SVM Guidelines for the Diagnosis and Management of Patients with Thoracic Aortic Disease. Circulation. 2010; 121: Z025-E527. Aortic aneurysm NOS (ICD10-I71.9). 6. Chronic severe T4 vertebral compression fracture. 7. Aortic Atherosclerosis (ICD10-I70.0). Electronically Signed   By: Ilona Sorrel M.D.   On: 02/23/2022 20:00   DG Chest Portable 1 View  Result Date: 02/23/2022 CLINICAL DATA:  Shortness of breath EXAM: PORTABLE CHEST 1 VIEW COMPARISON:  CT 02/05/2022, chest x-ray 02/11/2017 FINDINGS: Spinal rods and fixating screws in the cervical and thoracic spine. Partially visualized ascending thoracic stimulator leads at the lower thoracic spine. Mild heterogeneous airspace opacities, appears slightly improved as compared with chest CT from September 6. Cardiomegaly. Possible tiny pleural effusions. No visible pneumothorax IMPRESSION: 1. Cardiomegaly with possible tiny effusions 2. Residual heterogeneous bilateral airspace opacity corresponding to history of pneumonia, these appear slightly improved as compared to the chest CT from September 6. Electronically Signed   By: Donavan Foil M.D.   On: 02/23/2022 17:39   CT HEAD WO CONTRAST (5MM)  Result Date: 02/11/2022 CLINICAL DATA:  Altered  mental status. EXAM: CT HEAD WITHOUT CONTRAST TECHNIQUE: Contiguous axial images were obtained from the base of the skull through the vertex without intravenous contrast. RADIATION DOSE REDUCTION: This exam was performed according to the departmental dose-optimization program which includes  automated exposure control, adjustment of the mA and/or kV according to patient size and/or use of iterative reconstruction technique. COMPARISON:  March 06, 2015 FINDINGS: Brain: There is mild cerebral atrophy with widening of the extra-axial spaces and ventricular dilatation. There are areas of decreased attenuation within the white matter tracts of the supratentorial brain, consistent with microvascular disease changes. Vascular: No hyperdense vessel or unexpected calcification. Skull: Normal. Negative for fracture or focal lesion. Sinuses/Orbits: No acute finding. Other: None. IMPRESSION: Generalized cerebral atrophy and chronic white matter small vessel ischemic changes without evidence of an acute intracranial abnormality. Electronically Signed   By: Virgina Norfolk M.D.   On: 02/11/2022 20:08   IR US CHEST  Result Date: 02/11/2022 CLINICAL DATA:  Possible pleural effusion EXAM: CHEST ULTRASOUND COMPARISON:  Chest x-ray 02/10/2022 FINDINGS: Sonographic evaluation of the abdomen demonstrates no evidence of ascites or pleural effusion. Sonographic evaluation of the chest demonstrates no evidence of pleural effusion. IMPRESSION: Negative for pleural effusion and ascites. Electronically Signed   By: Jacqulynn Cadet M.D.   On: 02/11/2022 15:49   DG Chest Port 1 View  Result Date: 02/10/2022 CLINICAL DATA:  Dyspnea EXAM: PORTABLE CHEST 1 VIEW COMPARISON:  02-06-2022, February 05, 2022 FINDINGS: Limited chest radiograph due to patient positioning and motion artifact. Partially visualized cervical and thoracic fusion hardware and spinal stimulator device. Widened mediastinal and cardiac contours, likely  positional. Multifocal bilateral heterogeneous pulmonary opacities. Moderate left and small right pleural effusions. No large pneumothorax. No acute osseous abnormality. The visualized upper abdomen is unremarkable. IMPRESSION: 1. Markedly limited chest radiograph due to patient positioning and motion artifact. Within this limitation, there is multifocal bilateral heterogeneous pulmonary opacities, concerning for multifocal infection. 2. Moderate left and small right pleural effusions. Electronically Signed   By: Beryle Flock M.D.   On: 02/10/2022 12:26   DG Abd Portable 1V  Result Date: 02/09/2022 CLINICAL DATA:  Abdominal pain EXAM: PORTABLE ABDOMEN - 1 VIEW COMPARISON:  Previous studies including the CT done on 2022/02/06 FINDINGS: Bowel gas pattern is nonspecific. There is residual contrast in the colon from previous studies. There is no significant small bowel dilation. Stomach is unremarkable. There is previous surgical fusion in lumbar spine. There is previous left hip arthroplasty. Pain control lead is seen in thoracic spinal canal. IMPRESSION: Nonspecific bowel gas pattern. Electronically Signed   By: Elmer Picker M.D.   On: 02/09/2022 12:32   ECHOCARDIOGRAM COMPLETE  Result Date: 02/08/2022    ECHOCARDIOGRAM REPORT   Patient Name:   MARGUERITE JARBOE [facility] Date of Exam: 02/08/2022 Medical Rec #:  [id number]      Height:       72.0 in Accession #:    [id number]     Weight:       246.9 lb Date of Birth:  [birth year]      BSA:          2.330 m Patient Age:    71 years       BP:           90/60 mmHg Patient Gender: M              HR:           74 bpm. Exam Location:  Inpatient Procedure: 2D Echo Indications:    Atrial fibrillation  History:        Patient has prior history of Echocardiogram examinations, most  recent 05/06/2021. Risk Factors:Hypertension and Sleep Apnea.  Sonographer:    Johny Chess RDCS Referring Phys: [id number] [place]  1. Left ventricular  ejection fraction, by estimation, is 55 to 60%. The left ventricle has normal function. The left ventricle has no regional wall motion abnormalities. There is mild left ventricular hypertrophy. Left ventricular diastolic parameters are indeterminate.  2. Right ventricular systolic function is normal. The right ventricular size is normal. Tricuspid regurgitation signal is inadequate for assessing PA pressure.  3. The mitral valve is normal in structure. Trivial mitral valve regurgitation. No evidence of mitral stenosis.  4. The aortic valve is tricuspid. There is mild calcification of the aortic valve. There is mild thickening of the aortic valve. Aortic valve regurgitation is mild.  5. Aortic dilatation noted. There is moderate dilatation of the aortic root, measuring 44 mm. There is moderate dilatation of the ascending aorta, measuring 43 mm.  6. The inferior vena cava is normal in size with greater than 50% respiratory variability, suggesting right atrial pressure of 3 mmHg. FINDINGS  Left Ventricle: Left ventricular ejection fraction, by estimation, is 55 to 60%. The left ventricle has normal function. The left ventricle has no regional wall motion abnormalities. The left ventricular internal cavity size was normal in size. There is  mild left ventricular hypertrophy. Left ventricular diastolic parameters are indeterminate. Right Ventricle: The right ventricular size is normal. Right vetricular wall thickness was not well visualized. Right ventricular systolic function is normal. Tricuspid regurgitation signal is inadequate for assessing PA pressure. Left Atrium: Left atrial size was normal in size. Right Atrium: Right atrial size was normal in size. Pericardium: There is no evidence of pericardial effusion. Mitral Valve: The mitral valve is normal in structure. Trivial mitral valve regurgitation. No evidence of mitral valve stenosis. Tricuspid Valve: The tricuspid valve is not well visualized. Tricuspid valve  regurgitation is trivial. No evidence of tricuspid stenosis. Aortic Valve: The aortic valve is tricuspid. There is mild calcification of the aortic valve. There is mild thickening of the aortic valve. There is mild aortic valve annular calcification. Aortic valve regurgitation is mild. Pulmonic Valve: The pulmonic valve was not well visualized. Pulmonic valve regurgitation is not visualized. No evidence of pulmonic stenosis. Aorta: Aortic dilatation noted. There is moderate dilatation of the aortic root, measuring 44 mm. There is moderate dilatation of the ascending aorta, measuring 43 mm. Venous: The inferior vena cava is normal in size with greater than 50% respiratory variability, suggesting right atrial pressure of 3 mmHg. IAS/Shunts: The interatrial septum was not well visualized.  LEFT VENTRICLE PLAX 2D LVIDd:         5.40 cm   Diastology LVIDs:         4.00 cm   LV e' medial:    5.87 cm/s LV PW:         1.20 cm   LV E/e' medial:  11.0 LV IVS:        1.40 cm   LV e' lateral:   8.05 cm/s LVOT diam:     2.60 cm   LV E/e' lateral: 8.0 LV SV:         63 LV SV Index:   27 LVOT Area:     5.31 cm  RIGHT VENTRICLE             IVC RV S prime:     14.30 cm/s  IVC diam: 2.70 cm TAPSE (M-mode): 2.0 cm LEFT ATRIUM  Index        RIGHT ATRIUM           Index LA diam:        4.70 cm 2.02 cm/m   RA Area:     16.40 cm LA Vol (A2C):   57.9 ml 24.85 ml/m  RA Volume:   38.70 ml  16.61 ml/m LA Vol (A4C):   79.0 ml 33.90 ml/m LA Biplane Vol: 71.3 ml 30.60 ml/m  AORTIC VALVE LVOT Vmax:   63.90 cm/s LVOT Vmean:  41.900 cm/s LVOT VTI:    0.119 m  AORTA Ao Root diam: 4.40 cm Ao Asc diam:  4.30 cm MITRAL VALVE MV Area (PHT): 3.42 cm    SHUNTS MV Decel Time: 222 msec    Systemic VTI:  0.12 m MV E velocity: 64.50 cm/s  Systemic Diam: 2.60 cm MV A velocity: 61.30 cm/s MV E/A ratio:  1.05 Carlyle Dolly MD Electronically signed by Carlyle Dolly MD Signature Date/Time: 02/08/2022/4:55:02 PM    Final    US Abdomen Limited  RUQ (LIVER/GB)  Result Date: 02/05/2022 CLINICAL DATA:  Right upper quadrant abdominal pain. History of cirrhosis. EXAM: ULTRASOUND ABDOMEN LIMITED RIGHT UPPER QUADRANT COMPARISON:  CT the chest, abdomen and pelvis-earlier same day FINDINGS: Gallbladder: Normal sonographic appearance of the gallbladder. No gallbladder wall thickening or pericholecystic stranding. No echogenic gallstones or biliary sludge. Negative sonographic Murphy's sign. Common bile duct: Diameter: Normal in size measuring 4 mm in diameter Liver: Normal echogenicity of the hepatic parenchyma however there is questioned mild nodularity of the hepatic contour (representative image 26). No discrete hepatic lesions. No intrahepatic biliary dilatation. Portal vein is patent on color Doppler imaging with normal direction of blood flow towards the liver. Other: None. IMPRESSION: Suspected mild nodularity of the hepatic contour with preservation of the hepatic parenchymal echogenicity, nonspecific though could be seen in the setting of early cirrhotic change. Correlation with LFTs is advised. Electronically Signed   By: Sandi Mariscal M.D.   On: 02/05/2022 15:20   CT CHEST ABDOMEN PELVIS W CONTRAST  Result Date: 02/05/2022 CLINICAL DATA:  Abdominal pain, weakness EXAM: CT CHEST, ABDOMEN, AND PELVIS WITH CONTRAST TECHNIQUE: Multidetector CT imaging of the chest, abdomen and pelvis was performed following the standard protocol during bolus administration of intravenous contrast. RADIATION DOSE REDUCTION: This exam was performed according to the departmental dose-optimization program which includes automated exposure control, adjustment of the mA and/or kV according to patient size and/or use of iterative reconstruction technique. CONTRAST:  154m OMNIPAQUE IOHEXOL 300 MG/ML  SOLN COMPARISON:  Previous studies including the CT abdomen and pelvis done on 01/27/2022 FINDINGS: CT CHEST FINDINGS Cardiovascular: There is homogeneous enhancement in thoracic  aorta. 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 There are no intraluminal filling defects in central pulmonary artery branches. Peripheral pulmonary artery branches are not adequately visualized for evaluation. Coronary artery calcifications are seen. Mediastinum/Nodes: Unremarkable. Lungs/Pleura: Fairly extensive new patchy ground-glass infiltrates are noted in both lungs. There is minimal bilateral pleural effusions. There is no pneumothorax. Musculoskeletal: There is surgical fusion in cervical and upper thoracic spine. Pain control lead is seen in thoracic spinal canal. CT ABDOMEN PELVIS FINDINGS Hepatobiliary: There is fatty infiltration. Gallbladder is distended. There is no wall thickening. There is no fluid around the gallbladder. There is no dilation of bile ducts. Pancreas: No focal abnormalities are seen. Spleen: Spleen is enlarged measuring 16 cm in maximum diameter. Adrenals/Urinary Tract: Adrenals are unremarkable. There is no hydronephrosis. There  is 4.3 cm parapelvic cyst in the upper pole of left kidney. No follow-up imaging is recommended. There are no renal or ureteral stones. Urinary bladder is unremarkable. Stomach/Bowel: Small hiatal hernia is seen. There are surgical clips in the posterior mediastinum adjacent to the hiatal hernia. Small bowel loops are not dilated. Appendix is not dilated. Scattered diverticula are seen in colon without signs of focal acute diverticulitis. Vascular/Lymphatic: Arterial calcifications are seen. Reproductive: Unremarkable. Other: There is no ascites or pneumoperitoneum. There is previous ventral hernia repair. Umbilical hernia containing fat is seen. Bilateral inguinal hernias containing fat are seen, larger on the right side. Musculoskeletal: There is previous left hip arthroplasty. Lumbar surgical fusion is seen. Decrease in height of body of T4 vertebra has not changed. No recent fracture is seen. IMPRESSION: New extensive  patchy infiltrates are seen in both lungs suggesting multifocal pneumonia. Minimal bilateral pleural effusions. Coronary artery disease. 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 Recommend annual imaging followup by CTA or MRA. This recommendation follows 2010 ACCF/AHA/AATS/ACR/ASA/SCA/SCAI/SIR/STS/SVM Guidelines for the Diagnosis and Management of Patients with Thoracic Aortic Disease. Circulation. 2010; 121: G295-M841. Aortic aneurysm NOS (ICD10-I71.9) There is no evidence of intestinal obstruction or pneumoperitoneum. Appendix is not dilated. There is no hydronephrosis. Fatty liver. Small hiatal hernia. Splenomegaly. There is parapelvic cyst in the left kidney. Diverticulosis of colon without signs of focal diverticulitis. Other findings as described in the body of the report. Electronically Signed   By: Elmer Picker M.D.   On: 02/05/2022 13:26    Microbiology: Results for orders placed or performed during the hospital encounter of 02/23/22  Culture, blood (routine x 2)     Status: None   Collection Time: 02/23/22  4:37 PM   Specimen: Left Antecubital; Blood  Result Value Ref Range Status   Specimen Description   Final    LEFT ANTECUBITAL BOTTLES DRAWN AEROBIC AND ANAEROBIC   Special Requests Blood Culture adequate volume  Final   Culture   Final    NO GROWTH 5 DAYS Performed at [facility], [address], [place], [place] [address]    Report Status 02/28/2022 FINAL  Final  Culture, blood (routine x 2)     Status: None   Collection Time: 02/23/22  5:14 PM   Specimen: BLOOD LEFT HAND  Result Value Ref Range Status   Specimen Description BLOOD LEFT HAND BOTTLES DRAWN AEROBIC ONLY  Final   Special Requests   Final    Blood Culture results may not be optimal due to an inadequate volume of blood received in culture bottles   Culture   Final    NO GROWTH 5 DAYS Performed at [facility], [address], [place], [place] [address]    Report Status 02/28/2022 FINAL   Final  Urine Culture     Status: Abnormal   Collection Time: 02/23/22  5:34 PM   Specimen: Urine, Clean Catch  Result Value Ref Range Status   Specimen Description   Final    URINE, CLEAN CATCH Performed at [facility], [address] [place], [place] [address]    Special Requests   Final    NONE Performed at [facility], [address], [place], [place] [address]    Culture (A)  Final    20,000 COLONIES/mL MULTIPLE SPECIES PRESENT, SUGGEST RECOLLECTION   Report Status 02/25/2022 FINAL  Final  SARS Coronavirus 2 by RT PCR (hospital order, performed in [facility] hospital lab) *cepheid single result test* Anterior Nasal Swab  Status: None   Collection Time: 02/23/22  7:29 PM   Specimen: Anterior Nasal Swab  Result Value Ref Range Status   SARS Coronavirus 2 by RT PCR NEGATIVE NEGATIVE Final    Comment: (NOTE) SARS-CoV-2 target nucleic acids are NOT DETECTED.  The SARS-CoV-2 RNA is generally detectable in upper and lower respiratory specimens during the acute phase of infection. The lowest concentration of SARS-CoV-2 viral copies this assay can detect is 250 copies / mL. A negative result does not preclude SARS-CoV-2 infection and should not be used as the sole basis for treatment or other patient management decisions.  A negative result may occur with improper specimen collection / handling, submission of specimen other than nasopharyngeal swab, presence of viral mutation(s) within the areas targeted by this assay, and inadequate number of viral copies (<250 copies / mL). A negative result must be combined with clinical observations, patient history, and epidemiological information.  Fact Sheet for Patients:   https://www.patel.info/  Fact Sheet for Healthcare Providers: https://hall.com/  This test is not yet approved or  cleared by the [place] FDA and has been authorized for detection and/or diagnosis of SARS-CoV-2  by FDA under an Emergency Use Authorization (EUA).  This EUA will remain in effect (meaning this test can be used) for the duration of the COVID-19 declaration under Section 564(b)(1) of the Act, 21 U.S.C. section 360bbb-3(b)(1), unless the authorization is terminated or revoked sooner.  Performed at [facility], [address], [place], [place] [address]   MRSA Next Gen by PCR, Nasal     Status: None   Collection Time: 02/25/22 11:31 AM   Specimen: Nasal Mucosa; Nasal Swab  Result Value Ref Range Status   MRSA by PCR Next Gen NOT DETECTED NOT DETECTED Final    Comment: (NOTE) The GeneXpert MRSA Assay (FDA approved for NASAL specimens only), is one component of a comprehensive MRSA colonization surveillance program. It is not intended to diagnose MRSA infection nor to guide or monitor treatment for MRSA infections. Test performance is not FDA approved in patients less than 56 years old. Performed at [facility], [address], [place], [place] [address]     Labs: CBC: Recent Labs  Lab 02/23/22 1631 02/24/22 0449 02/25/22 0324 02/26/22 0900  WBC 14.6* 14.8* 13.3* 5.9  NEUTROABS 13.0*  --   --   --   HGB 13.2 11.4* 9.9* 9.6*  HCT 38.9* 35.0* 30.5* 30.3*  MCV 92.6 95.9 94.7 95.0  PLT 113* 87* 110* 97*   Basic Metabolic Panel: Recent Labs  Lab 02/23/22 1631 02/24/22 0449 02/25/22 0324 02/26/22 0900 02/28/22 0452  NA 135 136 138 141  --   K 3.9 5.0 4.5 3.7  --   CL 100 105 110 111  --   CO2 '23 23 23 25  '$ --   GLUCOSE 110* 119* 150* 130*  --   BUN '17 18 23 18  '$ --   CREATININE 1.19 1.13 1.05 0.89 0.92  CALCIUM 9.2 8.8* 8.4* 8.4*  --   MG  --   --  2.0  --   --    Liver Function Tests: Recent Labs  Lab 02/23/22 1631  AST 24  ALT 17  ALKPHOS 68  BILITOT 1.2  PROT 7.9  ALBUMIN 4.0   CBG: Recent Labs  Lab 02/25/22 0102  GLUCAP 151*    Discharge time spent: greater than 30 minutes.  Signed: Barton Dubois, MD [facility] 02/28/2022

## 2022-03-06 ENCOUNTER — Ambulatory Visit (HOSPITAL_COMMUNITY)
Admission: RE | Admit: 2022-03-06 | Discharge: 2022-03-06 | Disposition: A | Discharge status: Home / Self Care | Payer: Medicare Other | Source: Ambulatory Visit | Attending: Internal Medicine | Admitting: Internal Medicine

## 2022-03-06 ENCOUNTER — Other Ambulatory Visit (HOSPITAL_COMMUNITY): Payer: Self-pay | Admitting: Internal Medicine

## 2022-03-06 DIAGNOSIS — J189 Pneumonia, unspecified organism: Secondary | ICD-10-CM | POA: Diagnosis present

## 2022-03-26 ENCOUNTER — Ambulatory Visit: Payer: Medicare Other | Admitting: Neurology

## 2022-05-07 ENCOUNTER — Inpatient Hospital Stay: Payer: Medicare Other | Attending: Hematology

## 2022-05-07 DIAGNOSIS — Z79899 Other long term (current) drug therapy: Secondary | ICD-10-CM | POA: Diagnosis not present

## 2022-05-07 DIAGNOSIS — C884 Extranodal marginal zone B-cell lymphoma of mucosa-associated lymphoid tissue [MALT-lymphoma]: Secondary | ICD-10-CM | POA: Diagnosis present

## 2022-05-07 DIAGNOSIS — R519 Headache, unspecified: Secondary | ICD-10-CM | POA: Insufficient documentation

## 2022-05-07 DIAGNOSIS — R42 Dizziness and giddiness: Secondary | ICD-10-CM | POA: Diagnosis not present

## 2022-05-07 DIAGNOSIS — R0602 Shortness of breath: Secondary | ICD-10-CM | POA: Insufficient documentation

## 2022-05-07 DIAGNOSIS — K5900 Constipation, unspecified: Secondary | ICD-10-CM | POA: Insufficient documentation

## 2022-05-07 DIAGNOSIS — Z85820 Personal history of malignant melanoma of skin: Secondary | ICD-10-CM | POA: Diagnosis not present

## 2022-05-07 DIAGNOSIS — Z808 Family history of malignant neoplasm of other organs or systems: Secondary | ICD-10-CM | POA: Diagnosis not present

## 2022-05-07 DIAGNOSIS — Z8249 Family history of ischemic heart disease and other diseases of the circulatory system: Secondary | ICD-10-CM | POA: Insufficient documentation

## 2022-05-07 DIAGNOSIS — R61 Generalized hyperhidrosis: Secondary | ICD-10-CM | POA: Insufficient documentation

## 2022-05-07 DIAGNOSIS — E669 Obesity, unspecified: Secondary | ICD-10-CM | POA: Insufficient documentation

## 2022-05-07 DIAGNOSIS — Z807 Family history of other malignant neoplasms of lymphoid, hematopoietic and related tissues: Secondary | ICD-10-CM | POA: Insufficient documentation

## 2022-05-07 DIAGNOSIS — Z87891 Personal history of nicotine dependence: Secondary | ICD-10-CM | POA: Diagnosis not present

## 2022-05-07 DIAGNOSIS — D696 Thrombocytopenia, unspecified: Secondary | ICD-10-CM

## 2022-05-07 LAB — CBC WITH DIFFERENTIAL/PLATELET
Abs Immature Granulocytes: 0.01 10*3/uL (ref 0.00–0.07)
Basophils Absolute: 0 10*3/uL (ref 0.0–0.1)
Basophils Relative: 0 %
Eosinophils Absolute: 0 10*3/uL (ref 0.0–0.5)
Eosinophils Relative: 0 %
HCT: 40.7 % (ref 39.0–52.0)
Hemoglobin: 13.5 g/dL (ref 13.0–17.0)
Immature Granulocytes: 0 %
Lymphocytes Relative: 21 %
Lymphs Abs: 0.6 10*3/uL — ABNORMAL LOW (ref 0.7–4.0)
MCH: 31.3 pg (ref 26.0–34.0)
MCHC: 33.2 g/dL (ref 30.0–36.0)
MCV: 94.4 fL (ref 80.0–100.0)
Monocytes Absolute: 0.3 10*3/uL (ref 0.1–1.0)
Monocytes Relative: 10 %
Neutro Abs: 1.9 10*3/uL (ref 1.7–7.7)
Neutrophils Relative %: 69 %
Platelets: 78 10*3/uL — ABNORMAL LOW (ref 150–400)
RBC: 4.31 MIL/uL (ref 4.22–5.81)
RDW: 13.4 % (ref 11.5–15.5)
WBC: 2.7 10*3/uL — ABNORMAL LOW (ref 4.0–10.5)
nRBC: 0 % (ref 0.0–0.2)

## 2022-05-07 LAB — COMPREHENSIVE METABOLIC PANEL
ALT: 19 U/L (ref 0–44)
AST: 24 U/L (ref 15–41)
Albumin: 4.2 g/dL (ref 3.5–5.0)
Alkaline Phosphatase: 51 U/L (ref 38–126)
Anion gap: 8 (ref 5–15)
BUN: 20 mg/dL (ref 8–23)
CO2: 25 mmol/L (ref 22–32)
Calcium: 9 mg/dL (ref 8.9–10.3)
Chloride: 105 mmol/L (ref 98–111)
Creatinine, Ser: 1.59 mg/dL — ABNORMAL HIGH (ref 0.61–1.24)
GFR, Estimated: 46 mL/min — ABNORMAL LOW (ref >60)
Glucose, Bld: 104 mg/dL — ABNORMAL HIGH (ref 70–99)
Potassium: 4.1 mmol/L (ref 3.5–5.1)
Sodium: 138 mmol/L (ref 135–145)
Total Bilirubin: 1.1 mg/dL (ref 0.3–1.2)
Total Protein: 6.8 g/dL (ref 6.5–8.1)

## 2022-05-07 LAB — LACTATE DEHYDROGENASE: LDH: 115 U/L (ref 98–192)

## 2022-05-14 ENCOUNTER — Inpatient Hospital Stay (HOSPITAL_BASED_OUTPATIENT_CLINIC_OR_DEPARTMENT_OTHER): Payer: Medicare Other | Admitting: Hematology

## 2022-05-14 VITALS — BP 98/66 | HR 79 | Temp 98.1°F | Resp 19 | Ht 72.0 in | Wt 244.9 lb

## 2022-05-14 DIAGNOSIS — D696 Thrombocytopenia, unspecified: Secondary | ICD-10-CM | POA: Diagnosis not present

## 2022-05-14 DIAGNOSIS — D709 Neutropenia, unspecified: Secondary | ICD-10-CM | POA: Diagnosis not present

## 2022-05-14 DIAGNOSIS — C8307 Small cell B-cell lymphoma, spleen: Secondary | ICD-10-CM | POA: Diagnosis not present

## 2022-05-14 DIAGNOSIS — C884 Extranodal marginal zone B-cell lymphoma of mucosa-associated lymphoid tissue [MALT-lymphoma]: Secondary | ICD-10-CM | POA: Diagnosis not present

## 2022-05-14 NOTE — Patient Instructions (Addendum)
[name]  Discharge Instructions  You were seen and examined today by Dr. Delton Coombes.  Dr. Delton Coombes discussed your most recent lab work which revealed that everything looks good and stable.  Avoid NSAIDS and drink plenty of water 6 bottles daily.  Follow-up as scheduled in 6 months with labs.    Thank you for choosing [facility] to provide your oncology and hematology care.   To afford each patient quality time with our provider, please arrive at least 15 minutes before your scheduled appointment time. You may need to reschedule your appointment if you arrive late (10 or more minutes). Arriving late affects you and other patients whose appointments are after yours.  Also, if you miss three or more appointments without notifying the office, you may be dismissed from the clinic at the provider's discretion.    Again, thank you for choosing [facility].  Our hope is that these requests will decrease the amount of time that you wait before being seen by our physicians.   If you have a lab appointment with the [facility] please come in thru the Main Entrance and check in at the main information desk.           _____________________________________________________________  Should you have questions after your visit to [facility], please contact our office at [phone] and follow the prompts.  Our office hours are 8:00 a.m. to 4:30 p.m. Monday - Thursday and 8:00 a.m. to 2:30 p.m. Friday.  Please note that voicemails left after 4:00 p.m. may not be returned until the following business day.  We are closed weekends and all major holidays.  You do have access to a nurse 24-7, just call the main number to the clinic [phone] and do not press any options, hold on the line and a nurse will answer the phone.    For prescription refill requests, have your pharmacy contact our office and allow 72 hours.     Masks are optional in the cancer centers. If you would like for your care team to wear a mask while they are taking care of you, please let them know. You may have one support person who is at least 70 years old accompany you for your appointments.

## 2022-05-14 NOTE — Progress Notes (Signed)
[name], [name]   CLINIC:  Medical Oncology/Hematology  PCP:  [facility], [place] / [place] [address]  [phone]  REASON FOR VISIT:  Follow-up for splenic marginal zone lymphoma  PRIOR THERAPY: Prednisone 60 mg in 05/2020  CURRENT THERAPY: surveillance  INTERVAL HISTORY:  [name], a 71 y.o. male, seen for follow-up of lymphoma.  Denies any fevers, night sweats or weight loss.  Energy levels are 25%.  Had 2 admissions to the hospital recently with sepsis.  He is currently on full dose Xarelto 20 mg daily.  Reports occasional night sweats, once per week with no weight loss.  No fevers reported.  REVIEW OF SYSTEMS:  Review of Systems  Constitutional:  Negative for appetite change and fever.  HENT:   Negative for nosebleeds.   Respiratory:  Positive for shortness of breath. Negative for hemoptysis.   Gastrointestinal:  Positive for constipation. Negative for blood in stool.  Endocrine: Negative for hot flashes.  Genitourinary:  Negative for hematuria.   Neurological:  Positive for dizziness and headaches.  All other systems reviewed and are negative.   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   Coronary artery disease    mild by 09/08/13 cath [facility]   GERD (gastroesophageal reflux disease)    barretts esophagus   H/O hiatal hernia    Headache(784.0)    Hypercholesteremia    Hypertension    PONV (postoperative nausea and vomiting)    trouble breathing after last surgery (at [facility])   Shingles    10   Sleep apnea    not tested   Tumor liver    no problems   Past Surgical History:  Procedure Laterality Date   ABDOMINAL SURGERY      x3   back surgeries     x 9   BACK SURGERY     x8   BIOPSY N/A 03/28/2013   Procedure: Distal Esophageal Biopsy;  Surgeon: Rogene Houston, MD;  Location: [facility];  Service: Endoscopy;  Laterality: N/A;   BIOPSY  05/30/2016   Procedure: BIOPSY;  Surgeon: Rogene Houston, MD;  [facility];  Service: Endoscopy;;   BIOPSY  02/19/2018   Procedure: BIOPSY;  Surgeon: Rogene Houston, MD;  [facility];  Service: Endoscopy;;  Barret's esophagus   BIOPSY  08/29/2020   Procedure: BIOPSY;  Surgeon: Rogene Houston, MD;  [facility];  Service: Endoscopy;;   BIOPSY  12/31/2021   Procedure: BIOPSY;  Surgeon: Harvel Quale, MD;  [facility];  Service: Gastroenterology;;   CARDIAC CATHETERIZATION  2020   CHEST SURGERY     lung incisional hernia for hernia   COLONOSCOPY WITH PROPOFOL N/A 02/19/2018   Procedure: COLONOSCOPY WITH PROPOFOL;  Surgeon: Rogene Houston, MD;  [facility];  Service: Endoscopy;  Laterality: N/A;  1:00   CORONARY ANGIOPLASTY WITH STENT PLACEMENT  2020   2 blockages: 1 stent   ESOPHAGOGASTRODUODENOSCOPY (EGD) WITH PROPOFOL N/A 03/28/2013   Procedure: ESOPHAGOGASTRODUODENOSCOPY (EGD) WITH PROPOFOL;  Surgeon: Rogene Houston,  MD;  Location: [facility];  Service: Endoscopy;  Laterality: N/A;  GE junction @ 38, proximal  margin _0    ESOPHAGOGASTRODUODENOSCOPY (EGD) WITH PROPOFOL N/A 05/30/2016   Procedure: ESOPHAGOGASTRODUODENOSCOPY (EGD) WITH PROPOFOL;  Surgeon: Rogene Houston, MD;  [facility];  Service: Endoscopy;  Laterality: N/A;  955   ESOPHAGOGASTRODUODENOSCOPY (EGD) WITH PROPOFOL N/A 02/19/2018   Procedure: ESOPHAGOGASTRODUODENOSCOPY (EGD) WITH PROPOFOL;  Surgeon: Rogene Houston, MD;  [facility];  Service: Endoscopy;  Laterality: N/A;   ESOPHAGOGASTRODUODENOSCOPY (EGD) WITH PROPOFOL N/A 08/29/2020   Procedure: ESOPHAGOGASTRODUODENOSCOPY (EGD) WITH PROPOFOL;  Surgeon: Rogene Houston, MD;  [facility];  Service: Endoscopy;  Laterality: N/A;  AM   ESOPHAGOGASTRODUODENOSCOPY (EGD) WITH PROPOFOL N/A 12/31/2021   Procedure: ESOPHAGOGASTRODUODENOSCOPY (EGD) WITH PROPOFOL;  Surgeon: Harvel Quale, MD;  [facility];  Service: Gastroenterology;  Laterality: N/A;  100 ASA 2   HEMORRHOID SURGERY     x2   HERNIA REPAIR     HIATAL HERNIA REPAIR     JOINT REPLACEMENT     lft knee   KNEE ARTHROPLASTY     lft   KNEE ARTHROSCOPY Right    LEG SURGERY     x4 crushed    LITHOTRIPSY     MELANOMA EXCISION     lft abdomen   NECK SURGERY  2012   POLYPECTOMY  02/19/2018   Procedure: POLYPECTOMY;  Surgeon: Rogene Houston, MD;  [facility];  Service: Endoscopy;;  proximal transverse colon (CB x1), descending colon (CBx3)   POSTERIOR CERVICAL FUSION/FORAMINOTOMY  08/18/2011   Procedure: POSTERIOR CERVICAL FUSION/FORAMINOTOMY LEVEL 3;  Surgeon: Ophelia Charter, MD;  [facility];  Service: Neurosurgery;  Laterality: N/A;  Cervical three-four, four-five, five-six posterior cervical  fusion with instrumentation; Right cervical three-four, four-five laminotomy   REVISION TOTAL HIP ARTHROPLASTY Left    SHOULDER ARTHROSCOPY     rt bith defect , spur, rotator cuff   SPINAL CORD STIMULATOR IMPLANT     rt hip   SPINAL CORD STIMULATOR INSERTION N/A 11/03/2016   Procedure: LUMBAR SPINAL CORD STIMULATOR REVISION;  Surgeon: Newman Pies, MD;  Location: [facility];  Service: Neurosurgery;  Laterality: N/A;   THYROID LOBECTOMY     rt    SOCIAL HISTORY:  Social History   Socioeconomic History   Marital status: Married    Spouse name: Not on file   Number of children: Not on file   Years of education: Not on file   Highest education level: Not on file  Occupational History   Occupation: retired  Tobacco Use   Smoking status: Former    Packs/day: 4.00    Years: 10.00    Total pack years: 40.00    Types: Cigarettes    Quit date:  08/12/1980    Years since quitting: 41.7   Smokeless tobacco: Never   Tobacco comments:    occ wine  Vaping Use   Vaping Use: Never used  Substance and Sexual Activity   Alcohol use: Not Currently   Drug use: No   Sexual activity: Yes    Birth control/protection: None  Other Topics Concern   Not on file  Social History Narrative   Not on file   Social Determinants of Health   Financial Resource Strain: Low Risk  (05/09/2020)   Overall Financial Resource Strain (CARDIA)    Difficulty of Paying Living Expenses: Not hard at all  Food Insecurity: No Food Insecurity (02/23/2022)  Hunger Vital Sign    Worried About Running Out of Food in the Last Year: Never true    Ran Out of Food in the Last Year: Never true  Transportation Needs: No Transportation Needs (02/23/2022)   PRAPARE - Hydrologist (Medical): No    Lack of Transportation (Non-Medical): No  Physical Activity: Inactive (05/09/2020)   Exercise Vital Sign    Days of Exercise per Week: 0 days    Minutes of Exercise per Session: 0 min  Stress: No Stress Concern Present (05/09/2020)   [place]    Feeling of Stress : Not at all  Social Connections: Moderately Integrated (05/09/2020)   Social Connection and Isolation Panel [NHANES]    Frequency of Communication with Friends and Family: More than three times a week    Frequency of Social Gatherings with Friends and Family: Once a week    Attends Religious Services: More than 4 times per year    Active Member of [employer] or Organizations: No    Attends Archivist Meetings: Never    Marital Status: Married  Human resources officer Violence: Not At Risk (02/23/2022)   Humiliation, Afraid, Rape, and Kick questionnaire    Fear of Current or Ex-Partner: No    Emotionally Abused: No    Physically Abused: No    Sexually Abused: No    FAMILY HISTORY:  Family History  Problem Relation Age  of Onset   Heart disease Father    Aneurysm Mother    Aneurysm Sister    Heart disease Paternal Grandmother    Heart disease Paternal Grandfather    Heart disease Maternal Grandfather    Heart disease Brother        heart transplant   Aneurysm Other        pt states several members have aortic aneurysm   Skin cancer Other        several members   Lymphoma Brother    Aneurysm Maternal Grandmother     CURRENT MEDICATIONS:  Current Outpatient Medications  Medication Sig Dispense Refill   Bioflavonoid Products (ESTER-C) 500-550 MG TABS Take 1 tablet by mouth daily.     Calcium Carbonate-Vitamin D 600-10 MG-MCG TABS Take 1 tablet by mouth 2 (two) times daily.      dexlansoprazole (DEXILANT) 60 MG capsule Take 60 mg by mouth daily.      diazepam (VALIUM) 10 MG tablet Take 10 mg by mouth 4 (four) times daily as needed (for back pain).     fexofenadine (ALLEGRA) 180 MG tablet Take 180 mg by mouth every evening.     fish oil-omega-3 fatty acids 1000 MG capsule Take 1 g by mouth 2 (two) times daily.     hydrocortisone (CORTEF) 10 MG tablet Take 62m in am and 10 mg PM. 90 tablet 2   lisinopril (ZESTRIL) 10 MG tablet Take 10 mg by mouth daily.     Metamucil Fiber CHEW Chew 1 each by mouth daily.     metoCLOPramide (REGLAN) 10 MG tablet Take 1 tablet by mouth in the morning and at bedtime.     metoprolol succinate (TOPROL-XL) 25 MG 24 hr tablet Take 0.5 tablets (12.5 mg total) by mouth daily. 60 tablet 1   montelukast (SINGULAIR) 10 MG tablet Take 10 mg by mouth daily.      Multiple Vitamin (MULITIVITAMIN WITH MINERALS) TABS Take 1 tablet by mouth daily.     ondansetron (ZOFRAN) 8  MG tablet Take 1 tablet (8 mg total) by mouth every 8 (eight) hours as needed for vomiting or nausea. 30 tablet 2   oxyCODONE-acetaminophen (PERCOCET) 10-325 MG tablet Take 1 tablet by mouth every 6 (six) hours as needed for pain.     Probiotic Product (PROBIOTIC PO) Take 1 tablet by mouth daily. culterelle      ranolazine (RANEXA) 500 MG 12 hr tablet Take 500 mg by mouth 2 (two) times daily.     rivaroxaban (XARELTO) 20 MG TABS tablet Take 1 tablet (20 mg total) by mouth daily with supper. 30 tablet 2   rosuvastatin (CRESTOR) 10 MG tablet Take 5 mg by mouth every evening.     No current facility-administered medications for this visit.    ALLERGIES:  Allergies  Allergen Reactions   Demerol Other (See Comments)    "Makes me crazy."    PHYSICAL EXAM:  Performance status (ECOG): 1 - Symptomatic but completely ambulatory  Vitals:   05/14/22 1415  BP: 98/66  Pulse: 79  Resp: 19  Temp: 98.1 F (36.7 C)  SpO2: 95%   Wt Readings from Last 3 Encounters:  05/14/22 244 lb 14.4 oz (111.1 kg)  02/24/22 247 lb 2.2 oz (112.1 kg)  02/06/22 246 lb 14.6 oz (112 kg)   Physical Exam Vitals reviewed.  Constitutional:      Appearance: Normal appearance. He is obese.  Cardiovascular:     Rate and Rhythm: Normal rate and regular rhythm.     Pulses: Normal pulses.     Heart sounds: Normal heart sounds.  Pulmonary:     Effort: Pulmonary effort is normal.     Breath sounds: Normal breath sounds.  Abdominal:     Palpations: Abdomen is soft. There is no hepatomegaly, splenomegaly or mass.     Tenderness: There is no abdominal tenderness.  Lymphadenopathy:     Ruben Body: No right inguinal adenopathy. No left inguinal adenopathy.  Neurological:     General: No focal deficit present.     Mental Status: He is alert and oriented to person, place, and time.  Psychiatric:        Mood and Affect: Mood normal.        Behavior: Behavior normal.     LABORATORY DATA:  I have reviewed the labs as listed.     Latest Ref Rng & Units 05/07/2022    1:10 PM 02/26/2022    9:00 AM 02/25/2022    3:24 AM  CBC  WBC 4.0 - 10.5 K/uL 2.7  5.9  13.3   Hemoglobin 13.0 - 17.0 g/dL 13.5  9.6  9.9   Hematocrit 39.0 - 52.0 % 40.7  30.3  30.5   Platelets 150 - 400 K/uL 78  97  110       Latest Ref Rng & Units  05/07/2022    1:10 PM 02/28/2022    4:52 AM 02/26/2022    9:00 AM  CMP  Glucose 70 - 99 mg/dL 104   130   BUN 8 - 23 mg/dL 20   18   Creatinine 0.61 - 1.24 mg/dL 1.59  0.92  0.89   Sodium 135 - 145 mmol/L 138   141   Potassium 3.5 - 5.1 mmol/L 4.1   3.7   Chloride 98 - 111 mmol/L 105   111   CO2 22 - 32 mmol/L 25   25   Calcium 8.9 - 10.3 mg/dL 9.0   8.4   Total Protein 6.5 - 8.1  g/dL 6.8     Total Bilirubin 0.3 - 1.2 mg/dL 1.1     Alkaline Phos 38 - 126 U/L 51     AST 15 - 41 U/L 24     ALT 0 - 44 U/L 19         Component Value Date/Time   RBC 4.31 05/07/2022 1310   MCV 94.4 05/07/2022 1310   MCH 31.3 05/07/2022 1310   MCHC 33.2 05/07/2022 1310   RDW 13.4 05/07/2022 1310   LYMPHSABS 0.6 (L) 05/07/2022 1310   MONOABS 0.3 05/07/2022 1310   EOSABS 0.0 05/07/2022 1310   BASOSABS 0.0 05/07/2022 1310    DIAGNOSTIC IMAGING:  I have independently reviewed the scans and discussed with the patient. No results found.   ASSESSMENT:  1.  Moderate thrombocytopenia: -Seen as referral from Dr. Nolon Rod office for decreased platelet count of 80 on CBC on 10/21/2018. -Ex-smoker, quit in 1982, smoked 1 to 2 packs/day for 10 years.  He also spent 2 years at [facility]. -CT abdomen on 11/08/2018 showed normal size spleen with no abdominal or pelvic pathology. -BMBX on 03/01/2019 showed slightly hypercellular marrow for age with trilineage hematopoiesis including abundant megakaryocytes and predominantly normal morphology.  Overall myeloid changes are nonspecific and not diagnostic for MDS.  Chromosome analysis was normal.  MDS FISH panel was normal.  Core biopsy showed small lymphoid aggregates, features are not considered specific or diagnostic of lymphoproliferative disorder. -He was treated with pulsed dexamethasone around the 01/27/2020 with improvement of platelet count to 70 K.  He could not tolerate dexamethasone due to sleeplessness. -He was also treated with prednisone 60 mg daily on  05/22/2020.  He took it only for 2 to 3 days and stopped it due to inability to sleep. -Treated with immunoglobulin from 05/14/2020 through 05/18/2020. -PET scan on 06/05/2020 view shows no hypermetabolic lymph nodes in the chest, abdomen or pelvis.  Splenomegaly measuring 18 cm with normal metabolic activity. -Bone marrow biopsy on 06/26/2020 with mildly hypercellular marrow, no significant dysplasia.  Scattered large lymphoid aggregates with a predominance of B cells.  Flow cytometry was nondiagnostic.  There is no expression of CD5 or CD10 on the B cells. 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  However no clonality is demonstrated in the kappa light chain gene (IGK) -Myeloma FISH panel was negative.  Cytogenetics was 1, XY. -He was evaluated as second opinion by Dr. 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 - Received 4 cycles of rituximab from 09/05/2020 through 10/03/2020.   2.  Malignant melanoma: -Melanoma resected on the left side of the abdomen with left axillary sentinel lymph node biopsy at [facility] 14 years ago.  Reportedly stage III.     PLAN:  1.  Splenic marginal zone lymphoma: - He does not have any fevers or weight loss.  Occasional night sweats, once per week present. - Physical examination today did not reveal any palpable adenopathy or splenomegaly. - Reviewed labs from 05/07/2022 which showed normal LFTs.  LDH was normal.  CBC shows mild leukopenia with ANC normal.  Platelet count 78 K and stable.  Hemoglobin is normal. - He had 2 admissions with sepsis in the last few months.  He was recently evaluated by Dr. Caprice Beaver at [facility], quantitative immunoglobulins were normal. - Recommend follow-up in 6 months with repeat labs.  2.  Unprovoked pulmonary embolism (diagnosis 05/05/2021): - Continue  Xarelto indefinitely.  Orders placed  this encounter:  Orders Placed This Encounter  Procedures   CBC with Differential/Platelet   Comprehensive metabolic panel   Lactate dehydrogenase      Derek Jack, MD [place] [phone]

## 2022-07-15 ENCOUNTER — Encounter (HOSPITAL_COMMUNITY): Payer: Self-pay | Admitting: *Deleted

## 2022-07-15 ENCOUNTER — Other Ambulatory Visit: Payer: Self-pay

## 2022-07-15 ENCOUNTER — Emergency Department (HOSPITAL_COMMUNITY)
Admission: EM | Admit: 2022-07-15 | Discharge: 2022-07-15 | Disposition: A | Discharge status: Home / Self Care | Payer: Medicare Other | Attending: Emergency Medicine | Admitting: Emergency Medicine

## 2022-07-15 ENCOUNTER — Emergency Department (HOSPITAL_COMMUNITY): Payer: Medicare Other

## 2022-07-15 DIAGNOSIS — S299XXA Unspecified injury of thorax, initial encounter: Secondary | ICD-10-CM | POA: Diagnosis present

## 2022-07-15 DIAGNOSIS — S2231XA Fracture of one rib, right side, initial encounter for closed fracture: Secondary | ICD-10-CM

## 2022-07-15 DIAGNOSIS — W01198A Fall on same level from slipping, tripping and stumbling with subsequent striking against other object, initial encounter: Secondary | ICD-10-CM | POA: Diagnosis not present

## 2022-07-15 DIAGNOSIS — Z79899 Other long term (current) drug therapy: Secondary | ICD-10-CM | POA: Insufficient documentation

## 2022-07-15 DIAGNOSIS — Z7901 Long term (current) use of anticoagulants: Secondary | ICD-10-CM | POA: Insufficient documentation

## 2022-07-15 MED ORDER — HYDROCODONE-ACETAMINOPHEN 5-325 MG PO TABS
1.0000 | ORAL_TABLET | ORAL | Status: AC
  Administered 2022-07-15: 1 via ORAL
  Filled 2022-07-15: qty 1

## 2022-07-15 MED ORDER — LIDOCAINE 5 % EX PTCH
2.0000 | MEDICATED_PATCH | CUTANEOUS | Status: DC
  Administered 2022-07-15: 2 via TRANSDERMAL
  Filled 2022-07-15 (×2): qty 2

## 2022-07-15 MED ORDER — HYDROCODONE-ACETAMINOPHEN 5-325 MG PO TABS: 2.0000 | ORAL_TABLET | ORAL | 0 refills | Status: DC | PRN

## 2022-07-15 NOTE — ED Triage Notes (Signed)
Pt tripped and fell on concrete on his right ribs today.  Pt is on xarelto and took last dose yesterday. No bruising noted

## 2022-07-15 NOTE — ED Provider Notes (Signed)
[name] Provider Note   CSN: [id number] Arrival date & time: 07/15/22  1059     History {Add pertinent medical, surgical, social history, OB history to HPI:1} Chief Complaint  Patient presents with   [name] is a 72 y.o. male.  72 year old male presents emergency department with right rib pain.  Patient reports that he was walking to the store to get a CPAP machine and tripped over the curb.  Says that he fell on his right side and hit his ribs.  Denies any upper or lower extremity pain.  No head strike or LOC.  No preceding symptoms.  No neck pain.  Says that it hurts to take a deep breath and so he wanted to come into the emergency department for evaluation.       Home Medications Prior to Admission medications   Medication Sig Start Date End Date Taking? Authorizing Provider  Bioflavonoid Products (ESTER-C) 500-550 MG TABS Take 1 tablet by mouth daily.    [provider]  Calcium Carbonate-Vitamin D 600-10 MG-MCG TABS Take 1 tablet by mouth 2 (two) times daily.     [provider]  dexlansoprazole (DEXILANT) 60 MG capsule Take 60 mg by mouth daily.     [provider]  diazepam (VALIUM) 10 MG tablet Take 10 mg by mouth 4 (four) times daily as needed (for back pain).    [provider]  fexofenadine (ALLEGRA) 180 MG tablet Take 180 mg by mouth every evening.    [provider]  fish oil-omega-3 fatty acids 1000 MG capsule Take 1 g by mouth 2 (two) times daily.    [provider]  hydrocortisone (CORTEF) 10 MG tablet Take 58m in am and 10 mg PM. 02/28/22   MBarton Dubois MD  lisinopril (ZESTRIL) 10 MG tablet Take 10 mg by mouth daily.    [provider]  Metamucil Fiber CHEW Chew 1 each by mouth daily.    [provider]  metoCLOPramide (REGLAN) 10 MG tablet Take 1 tablet by mouth in the morning and at bedtime. 08/03/20   [provider]   metoprolol succinate (TOPROL-XL) 25 MG 24 hr tablet Take 0.5 tablets (12.5 mg total) by mouth daily. 03/01/22   MBarton Dubois MD  montelukast (SINGULAIR) 10 MG tablet Take 10 mg by mouth daily.     [provider]  Multiple Vitamin (MULITIVITAMIN WITH MINERALS) TABS Take 1 tablet by mouth daily.    [provider]  ondansetron (ZOFRAN) 8 MG tablet Take 1 tablet (8 mg total) by mouth every 8 (eight) hours as needed for vomiting or nausea. 11/18/21   CHarvel Quale MD  oxyCODONE-acetaminophen (PERCOCET) 10-325 MG tablet Take 1 tablet by mouth every 6 (six) hours as needed for pain.    [provider]  Probiotic Product (PROBIOTIC PO) Take 1 tablet by mouth daily. culterelle    [provider]  ranolazine (RANEXA) 500 MG 12 hr tablet Take 500 mg by mouth 2 (two) times daily. 04/23/20   [provider]  rivaroxaban (XARELTO) 20 MG TABS tablet Take 1 tablet (20 mg total) by mouth daily with supper. 05/29/21   SManuella Ghazi Pratik D, DO  rosuvastatin (CRESTOR) 10 MG tablet Take 5 mg by mouth every evening.    [provider]      Allergies    Demerol    Review of Systems   Review of Systems  Physical Exam  Updated Vital Signs BP 137/77 (BP Location: Left Arm)   Pulse 72   Temp 98.1 F (36.7 C) (Oral)   Resp 16   SpO2 93%  Physical Exam Constitutional:      General: He is not in acute distress.    Appearance: Normal appearance. He is not ill-appearing.  HENT:     Head: Normocephalic and atraumatic.     Right Ear: External ear normal.     Left Ear: External ear normal.     Mouth/Throat:     Mouth: Mucous membranes are moist.     Pharynx: Oropharynx is clear.  Eyes:     Extraocular Movements: Extraocular movements intact.     Conjunctiva/sclera: Conjunctivae normal.     Pupils: Pupils are equal, round, and reactive to light.  Neck:     Comments: No C-spine midline tenderness to palpation Cardiovascular:     Rate and Rhythm:  Normal rate and regular rhythm.     Pulses: Normal pulses.     Heart sounds: Normal heart sounds.  Pulmonary:     Effort: Pulmonary effort is normal. No respiratory distress.     Breath sounds: Normal breath sounds.  Abdominal:     General: Abdomen is flat. There is no distension.     Palpations: Abdomen is soft. There is no mass.     Tenderness: There is no abdominal tenderness. There is no guarding.  Musculoskeletal:        General: No deformity. Normal range of motion.     Cervical back: No rigidity or tenderness.     Comments: No tenderness to palpation of midline thoracic or lumbar spine.  No step-offs palpated.  Tenderness to palpation of right lateral chest wall.  No bruising noted to chest wall.  No tenderness to palpation of bilateral clavicles.  No tenderness to palpation, bruising, or deformities noted of bilateral shoulders, elbows, wrists, hips, knees, or ankles.  Neurological:     General: No focal deficit present.     Mental Status: He is alert and oriented to person, place, and time. Mental status is at baseline.     Cranial Nerves: No cranial nerve deficit.     Sensory: No sensory deficit.     Motor: No weakness.     ED Results / Procedures / Treatments   Labs (all labs ordered are listed, but only abnormal results are displayed) Labs Reviewed - No data to display  EKG None  Radiology DG Ribs Unilateral W/Chest Right  Result Date: 07/15/2022 CLINICAL DATA:  Fall. EXAM: RIGHT RIBS AND CHEST - 3+ VIEW COMPARISON:  Chest radiograph 03/06/2022. FINDINGS: Three views. Acute, mildly displaced fracture of the right anterior eighth rib. No pleural effusion or pneumothorax. Low lung volumes accentuate the pulmonary vasculature and cardiomediastinal silhouette. Partially visualized spinal cord stimulator and spinal fusion hardware. IMPRESSION: Acute, mildly displaced fracture of the right anterior eighth rib. Electronically Signed   By: Emmit Alexanders M.D.   On:  07/15/2022 12:04    Procedures Procedures  {Document cardiac monitor, telemetry assessment procedure when appropriate:1}  Medications Ordered in ED Medications - No data to display  ED Course/ Medical Decision Making/ A&P   {   Click here for ABCD2, HEART and other calculatorsREFRESH Note before signing :1}                          Medical Decision Making Amount and/or Complexity of Data Reviewed Radiology: ordered.   ***  {  Document critical care time when appropriate:1} {Document review of labs and clinical decision tools ie heart score, Chads2Vasc2 etc:1}  {Document your independent review of radiology images, and any outside records:1} {Document your discussion with family members, caretakers, and with consultants:1} {Document social determinants of health affecting pt's care:1} {Document your decision making why or why not admission, treatments were needed:1} Final Clinical Impression(s) / ED Diagnoses Final diagnoses:  None    Rx / DC Orders ED Discharge Orders     None

## 2022-07-15 NOTE — Discharge Instructions (Signed)
You were seen for your broken rib in the emergency department.   At home, please take tylenol and use over the counter lidocaine patches for your pain.  You may also take the norco we have prescribed you for any breakthrough pain that may have.  Do not take this before driving or operating heavy machinery.  Do not take this medication with alcohol.    Use the incentive spirometer we have given you every 2 hours to prevent development of pneumonia.  Follow-up with your primary doctor in 2-3 days regarding your visit.    Return immediately to the emergency department if you experience any of the following: Fevers, difficulty breathing, or any other concerning symptoms.    Thank you for visiting our Emergency Department. It was a pleasure taking care of you today.

## 2022-09-01 ENCOUNTER — Other Ambulatory Visit (HOSPITAL_COMMUNITY)
Admission: RE | Admit: 2022-09-01 | Discharge: 2022-09-01 | Disposition: A | Discharge status: Home / Self Care | Payer: Medicare Other | Source: Ambulatory Visit | Attending: Nurse Practitioner | Admitting: Nurse Practitioner

## 2022-09-01 DIAGNOSIS — C8307 Small cell B-cell lymphoma, spleen: Secondary | ICD-10-CM | POA: Insufficient documentation

## 2022-09-01 DIAGNOSIS — Z01812 Encounter for preprocedural laboratory examination: Secondary | ICD-10-CM | POA: Diagnosis not present

## 2022-09-01 LAB — PROTIME-INR
INR: 2.7 — ABNORMAL HIGH (ref 0.8–1.2)
Prothrombin Time: 28.1 seconds — ABNORMAL HIGH (ref 11.4–15.2)

## 2022-09-01 LAB — CBC
HCT: 40.3 % (ref 39.0–52.0)
Hemoglobin: 13.2 g/dL (ref 13.0–17.0)
MCH: 30.6 pg (ref 26.0–34.0)
MCHC: 32.8 g/dL (ref 30.0–36.0)
MCV: 93.5 fL (ref 80.0–100.0)
Platelets: 51 10*3/uL — ABNORMAL LOW (ref 150–400)
RBC: 4.31 MIL/uL (ref 4.22–5.81)
RDW: 13 % (ref 11.5–15.5)
WBC: 2 10*3/uL — ABNORMAL LOW (ref 4.0–10.5)
nRBC: 0 % (ref 0.0–0.2)

## 2022-09-18 ENCOUNTER — Other Ambulatory Visit: Payer: Self-pay

## 2022-09-18 ENCOUNTER — Emergency Department (HOSPITAL_COMMUNITY)
Admission: EM | Admit: 2022-09-18 | Discharge: 2022-09-18 | Discharge status: Against Medical Advice | Payer: Medicare Other | Attending: Emergency Medicine | Admitting: Emergency Medicine

## 2022-09-18 ENCOUNTER — Encounter (HOSPITAL_COMMUNITY): Payer: Self-pay | Admitting: Emergency Medicine

## 2022-09-18 DIAGNOSIS — K5900 Constipation, unspecified: Secondary | ICD-10-CM | POA: Diagnosis not present

## 2022-09-18 DIAGNOSIS — R531 Weakness: Secondary | ICD-10-CM | POA: Insufficient documentation

## 2022-09-18 DIAGNOSIS — Z1152 Encounter for screening for COVID-19: Secondary | ICD-10-CM | POA: Insufficient documentation

## 2022-09-18 DIAGNOSIS — E860 Dehydration: Secondary | ICD-10-CM | POA: Insufficient documentation

## 2022-09-18 DIAGNOSIS — Z981 Arthrodesis status: Secondary | ICD-10-CM | POA: Diagnosis not present

## 2022-09-18 DIAGNOSIS — I700 Atherosclerosis of aorta: Secondary | ICD-10-CM | POA: Diagnosis not present

## 2022-09-18 DIAGNOSIS — R509 Fever, unspecified: Secondary | ICD-10-CM | POA: Insufficient documentation

## 2022-09-18 DIAGNOSIS — R1310 Dysphagia, unspecified: Secondary | ICD-10-CM | POA: Insufficient documentation

## 2022-09-18 DIAGNOSIS — Z5321 Procedure and treatment not carried out due to patient leaving prior to being seen by health care provider: Secondary | ICD-10-CM | POA: Diagnosis not present

## 2022-09-18 DIAGNOSIS — R1110 Vomiting, unspecified: Secondary | ICD-10-CM | POA: Diagnosis present

## 2022-09-18 DIAGNOSIS — R161 Splenomegaly, not elsewhere classified: Secondary | ICD-10-CM | POA: Insufficient documentation

## 2022-09-18 NOTE — ED Triage Notes (Signed)
Pt arrives POV c/o emesis since he was put to sleep for his bone marrow biopsy at [facility] on 09/08/22. Pt states they scraped the side of his throat when they put him to sleep and he has been having problems since.

## 2022-09-19 ENCOUNTER — Other Ambulatory Visit: Payer: Self-pay

## 2022-09-19 ENCOUNTER — Emergency Department (HOSPITAL_COMMUNITY): Payer: Medicare Other

## 2022-09-19 ENCOUNTER — Emergency Department (HOSPITAL_COMMUNITY)
Admission: EM | Admit: 2022-09-19 | Discharge: 2022-09-19 | Disposition: A | Discharge status: Home / Self Care | Payer: Medicare Other | Source: Home / Self Care | Attending: Emergency Medicine | Admitting: Emergency Medicine

## 2022-09-19 ENCOUNTER — Encounter (HOSPITAL_COMMUNITY): Payer: Self-pay | Admitting: Emergency Medicine

## 2022-09-19 DIAGNOSIS — R531 Weakness: Secondary | ICD-10-CM

## 2022-09-19 DIAGNOSIS — R1110 Vomiting, unspecified: Secondary | ICD-10-CM | POA: Diagnosis not present

## 2022-09-19 DIAGNOSIS — R1310 Dysphagia, unspecified: Secondary | ICD-10-CM

## 2022-09-19 DIAGNOSIS — R509 Fever, unspecified: Secondary | ICD-10-CM

## 2022-09-19 DIAGNOSIS — E860 Dehydration: Secondary | ICD-10-CM

## 2022-09-19 LAB — PROTIME-INR
INR: 3.4 — ABNORMAL HIGH (ref 0.8–1.2)
Prothrombin Time: 33.8 seconds — ABNORMAL HIGH (ref 11.4–15.2)

## 2022-09-19 LAB — URINALYSIS, ROUTINE W REFLEX MICROSCOPIC
Bilirubin Urine: NEGATIVE
Glucose, UA: NEGATIVE mg/dL
Hgb urine dipstick: NEGATIVE
Ketones, ur: NEGATIVE mg/dL
Leukocytes,Ua: NEGATIVE
Nitrite: NEGATIVE
Protein, ur: NEGATIVE mg/dL
Specific Gravity, Urine: 1.017 (ref 1.005–1.030)
pH: 5 (ref 5.0–8.0)

## 2022-09-19 LAB — CBC WITH DIFFERENTIAL/PLATELET
Abs Immature Granulocytes: 0.03 10*3/uL (ref 0.00–0.07)
Basophils Absolute: 0 10*3/uL (ref 0.0–0.1)
Basophils Relative: 0 %
Eosinophils Absolute: 0 10*3/uL (ref 0.0–0.5)
Eosinophils Relative: 0 %
HCT: 40.1 % (ref 39.0–52.0)
Hemoglobin: 13.3 g/dL (ref 13.0–17.0)
Immature Granulocytes: 0 %
Lymphocytes Relative: 9 %
Lymphs Abs: 0.7 10*3/uL (ref 0.7–4.0)
MCH: 30.8 pg (ref 26.0–34.0)
MCHC: 33.2 g/dL (ref 30.0–36.0)
MCV: 92.8 fL (ref 80.0–100.0)
Monocytes Absolute: 1 10*3/uL (ref 0.1–1.0)
Monocytes Relative: 13 %
Neutro Abs: 6.1 10*3/uL (ref 1.7–7.7)
Neutrophils Relative %: 78 %
Platelets: 63 10*3/uL — ABNORMAL LOW (ref 150–400)
RBC: 4.32 MIL/uL (ref 4.22–5.81)
RDW: 12.9 % (ref 11.5–15.5)
WBC: 7.9 10*3/uL (ref 4.0–10.5)
nRBC: 0 % (ref 0.0–0.2)

## 2022-09-19 LAB — LACTIC ACID, PLASMA
Lactic Acid, Venous: 0.7 mmol/L (ref 0.5–1.9)
Lactic Acid, Venous: 0.9 mmol/L (ref 0.5–1.9)

## 2022-09-19 LAB — COMPREHENSIVE METABOLIC PANEL
ALT: 14 U/L (ref 0–44)
AST: 15 U/L (ref 15–41)
Albumin: 4.2 g/dL (ref 3.5–5.0)
Alkaline Phosphatase: 47 U/L (ref 38–126)
Anion gap: 11 (ref 5–15)
BUN: 26 mg/dL — ABNORMAL HIGH (ref 8–23)
CO2: 23 mmol/L (ref 22–32)
Calcium: 9 mg/dL (ref 8.9–10.3)
Chloride: 104 mmol/L (ref 98–111)
Creatinine, Ser: 1.47 mg/dL — ABNORMAL HIGH (ref 0.61–1.24)
GFR, Estimated: 51 mL/min — ABNORMAL LOW (ref >60)
Glucose, Bld: 116 mg/dL — ABNORMAL HIGH (ref 70–99)
Potassium: 3.7 mmol/L (ref 3.5–5.1)
Sodium: 138 mmol/L (ref 135–145)
Total Bilirubin: 1 mg/dL (ref 0.3–1.2)
Total Protein: 6.9 g/dL (ref 6.5–8.1)

## 2022-09-19 LAB — RESP PANEL BY RT-PCR (RSV, FLU A&B, COVID)  RVPGX2
Influenza A by PCR: NEGATIVE
Influenza B by PCR: NEGATIVE
Resp Syncytial Virus by PCR: NEGATIVE
SARS Coronavirus 2 by RT PCR: NEGATIVE

## 2022-09-19 LAB — CULTURE, BLOOD (ROUTINE X 2): Culture: NO GROWTH

## 2022-09-19 LAB — GROUP A STREP BY PCR: Group A Strep by PCR: NOT DETECTED

## 2022-09-19 MED ORDER — LACTATED RINGERS IV BOLUS
1000.0000 mL | Freq: Once | INTRAVENOUS | Status: AC
  Administered 2022-09-19: 1000 mL via INTRAVENOUS

## 2022-09-19 MED ORDER — IOHEXOL 350 MG/ML SOLN
100.0000 mL | Freq: Once | INTRAVENOUS | Status: AC | PRN
  Administered 2022-09-19: 100 mL via INTRAVENOUS

## 2022-09-19 NOTE — ED Triage Notes (Signed)
Pt POV from home c/o fever (up to 101 at home/last Tylenol  at 2100), emesis, and sore throat, pt reports s/s since put to sleep at [facility] for bone marrow biopsy on 4/8

## 2022-09-19 NOTE — ED Provider Notes (Signed)
[place] EMERGENCY DEPARTMENT AT [facility] Provider Note   CSN: [id number] Arrival date & time: 09/18/22  2345     History  Chief Complaint  Patient presents with   Fever    [name] is a 72 y.o. male.  72 year old male who presents the ER today secondary to multiple nonspecific complaints.  Patient had a bone marrow biopsy on the eighth of this month at [facility].  States that there was some type of complication with his ET tube causing some type of abrasion and hematoma to the back of his throat.  He has persistent throat pain because of it.  He had some nausea and vomiting for a while but this seems to have improved.  He has had some fevers of unclear etiology.  Feels generally weak.  Was ultimately diagnosed with lymphoma. Decreased urine. Constipated.    Fever      Home Medications Prior to Admission medications   Medication Sig Start Date End Date Taking? Authorizing Provider  Bioflavonoid Products (ESTER-C) 500-550 MG TABS Take 1 tablet by mouth daily.    [provider]  Calcium Carbonate-Vitamin D 600-10 MG-MCG TABS Take 1 tablet by mouth 2 (two) times daily.     [provider]  dexlansoprazole (DEXILANT) 60 MG capsule Take 60 mg by mouth daily.     [provider]  diazepam (VALIUM) 10 MG tablet Take 10 mg by mouth 4 (four) times daily as needed (for back pain).    [provider]  diltiazem (CARDIZEM) 30 MG tablet Take 30 mg by mouth 4 (four) times daily.    [provider]  fexofenadine (ALLEGRA) 180 MG tablet Take 180 mg by mouth every evening.    [provider]  fish oil-omega-3 fatty acids 1000 MG capsule Take 1 g by mouth 2 (two) times daily.    [provider]  HYDROcodone-acetaminophen (NORCO/VICODIN) 5-325 MG tablet Take 2 tablets by mouth every 4 (four) hours as needed. 07/15/22   Rondel Baton, MD  hydrocortisone (CORTEF) 10 MG tablet Take  in am and 10 mg PM. 02/28/22    Vassie Loll, MD  lisinopril (ZESTRIL) 10 MG tablet Take 10 mg by mouth daily.    [provider]  Metamucil Fiber CHEW Chew 1 each by mouth daily.    [provider]  metoCLOPramide (REGLAN) 10 MG tablet Take 1 tablet by mouth in the morning and at bedtime. 08/03/20   [provider]  metoprolol succinate (TOPROL-XL) 25 MG 24 hr tablet Take 0.5 tablets (12.5 mg total) by mouth daily. Patient not taking: Reported on 07/15/2022 03/01/22   Vassie Loll, MD  montelukast (SINGULAIR) 10 MG tablet Take 10 mg by mouth daily.     [provider]  Multiple Vitamin (MULITIVITAMIN WITH MINERALS) TABS Take 1 tablet by mouth daily.    [provider]  ondansetron (ZOFRAN) 8 MG tablet Take 1 tablet (8 mg total) by mouth every 8 (eight) hours as needed for vomiting or nausea. 11/18/21   Dolores Frame, MD  oxyCODONE-acetaminophen (PERCOCET) 10-325 MG tablet Take 1 tablet by mouth every 6 (six) hours as needed for pain.    [provider]  Probiotic Product (PROBIOTIC PO) Take 1 tablet by mouth daily. culterelle    [provider]  ranolazine (RANEXA) 500 MG 12 hr tablet Take 500 mg by mouth 2 (two) times daily. 04/23/20   [provider]  rivaroxaban (XARELTO) 20 MG TABS tablet Take 1  tablet (20 mg total) by mouth daily with supper. 05/29/21   Sherryll Burger, Pratik D, DO  rosuvastatin (CRESTOR) 10 MG tablet Take 10 mg by mouth every evening.    [provider]      Allergies    Demerol    Review of Systems   Review of Systems  Constitutional:  Positive for fever.    Physical Exam Updated Vital Signs BP 105/62   Pulse 68   Temp 98 F (36.7 C) (Oral)   Resp 20   Ht 6' (1.829 m)   Wt 110.7 kg   SpO2 97%   BMI 33.09 kg/m  Physical Exam Vitals and nursing note reviewed.  Constitutional:      Appearance: He is well-developed.  HENT:     Head: Normocephalic and atraumatic.  Eyes:     Pupils: Pupils are equal,  round, and reactive to light.  Cardiovascular:     Rate and Rhythm: Normal rate.  Pulmonary:     Effort: Pulmonary effort is normal. No respiratory distress.  Abdominal:     General: Abdomen is flat. There is no distension.  Musculoskeletal:        General: Normal range of motion.     Cervical back: Normal range of motion.  Skin:    General: Skin is warm and dry.  Neurological:     General: No focal deficit present.     Mental Status: He is alert.     ED Results / Procedures / Treatments   Labs (all labs ordered are listed, but only abnormal results are displayed) Labs Reviewed  COMPREHENSIVE METABOLIC PANEL - Abnormal; Notable for the following components:      Result Value   Glucose, Bld 116 (*)    BUN 26 (*)    Creatinine, Ser 1.47 (*)    GFR, Estimated 51 (*)    All other components within normal limits  CBC WITH DIFFERENTIAL/PLATELET - Abnormal; Notable for the following components:   Platelets 63 (*)    All other components within normal limits  PROTIME-INR - Abnormal; Notable for the following components:   Prothrombin Time 33.8 (*)    INR 3.4 (*)    All other components within normal limits  CULTURE, BLOOD (ROUTINE X 2)  CULTURE, BLOOD (ROUTINE X 2)  GROUP A STREP BY PCR  RESP PANEL BY RT-PCR (RSV, FLU A&B, COVID)  [id number]  LACTIC ACID, PLASMA  LACTIC ACID, PLASMA  URINALYSIS, ROUTINE W REFLEX MICROSCOPIC    EKG EKG Interpretation  Date/Time:  Friday September 19 2022 01:41:42 EDT Ventricular Rate:  66 PR Interval:  179 QRS Duration: 110 QT Interval:  386 QTC Calculation: 405 R Axis:   -49 Text Interpretation: Sinus rhythm Ventricular premature complex Left anterior fascicular block Low voltage, precordial leads Consider anterior infarct Confirmed by Marily Memos [phone]) on 09/19/2022 2:38:22 AM  Radiology CT Angio Chest/Abd/Pel for Dissection W and/or Wo Contrast  Result Date: 09/19/2022 CLINICAL DATA:  72 year old male with fever, vomiting, sore  throat. Recent bone marrow biopsy. History of splenomegaly, thrombocytopenia. EXAM: CT ANGIOGRAPHY CHEST, ABDOMEN AND PELVIS TECHNIQUE: Non-contrast CT of the chest was initially obtained. Multidetector CT imaging through the chest, abdomen and pelvis was performed using the standard protocol during bolus administration of intravenous contrast. Multiplanar reconstructed images and MIPs were obtained and reviewed to evaluate the vascular anatomy. RADIATION DOSE REDUCTION: This exam was performed according to the departmental dose-optimization program which includes automated exposure control, adjustment of the mA and/or kV according to  patient size and/or use of iterative reconstruction technique. CONTRAST:  OMNIPAQUE IOHEXOL 350 MG/ML SOLN COMPARISON:  CTA chest 02/23/2022. CT Chest, Abdomen, and Pelvis 02/05/2022. FINDINGS: CTA CHEST FINDINGS Cardiovascular: Extensive chronic calcified coronary artery plaque and/or stents. Calcified aortic atherosclerosis. Stable heart size, borderline to mild cardiomegaly. Negative for pericardial effusion. Negative for thoracic aortic aneurysm or dissection. Central pulmonary arteries are also well opacified and appear to be patent. Mediastinum/Nodes: Negative for mediastinal mass or lymphadenopathy. Lungs/Pleura: Improved lung volumes and less respiratory motion compared to last year. Bilateral confluent airspace opacity at that time has largely resolved. Chronic reduced left lung volume appears related to lingula and lower lobe atelectasis and scarring which are stable. Major airways are patent. No pleural effusion or new pulmonary opacity. Musculoskeletal: Extensive chronic spinal postoperative changes. Chronic upper thoracic posterior fusion hardware. Chronic thoracic spinal stimulator in place. Chronic T4 compression fracture and posterior decompression there. Mild chronic T7 compression fracture. Stable visualized osseous structures. Review of the MIP images confirms  the above findings. CTA ABDOMEN AND PELVIS FINDINGS VASCULAR Abdominal aortic aneurysm or dissection. Aortoiliac calcified atherosclerosis. Major arterial structures in the abdomen and pelvis are patent. Negative for Review of the MIP images confirms the above findings. NON-VASCULAR Hepatobiliary: Stable, negative liver and gallbladder. 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 Spleen: Stable splenomegaly (series 9, image 100, 17-18 cm spleen length). No discrete splenic lesion. Adrenals/Urinary Tract: Normal adrenal glands. Stable kidneys with left greater than right benign renal parapelvic cysts (no follow-up imaging recommended). Decompressed ureters. Unremarkable bladder. Stomach/Bowel: Redundant large bowel with retained stool, and diverticulosis of the descending and sigmoid segments. Diverticulosis also at the padded flexure. No evidence of large bowel obstruction or inflammation. Diminutive or absent appendix. Decompressed terminal ileum. No dilated small bowel. Chronic postoperative changes to the stomach and small to moderate gastric hiatal hernia appears stable. Intra-abdominal stomach is decompressed. No free air or free fluid. Lymphatic: No lymphadenopathy in the abdomen or pelvis. Reproductive: Stable mild fat containing inguinal hernias. Other: Streak artifact in the pelvis from left hip arthroplasty but no pelvis free fluid is identified. Musculoskeletal: Extensive chronic lumbar posterior and interbody fusion. Chronic mid lumbar decompression. Posterior right midline spinal stimulator generator device. Chronic left hip arthroplasty. No acute osseous abnormality identified. Review of the MIP images confirms the above findings. IMPRESSION: 1. Negative for aortic aneurysm or dissection. Aortic Atherosclerosis (ICD10-I70.0). 2. No superimposed acute or inflammatory process identified in the chest, abdomen, or pelvis. Chronic Splenomegaly. Chronic thoracic and lumbar spinal  fusion, decompression, spinal stimulator. And other chronic findings detailed above. Electronically Signed   By: Odessa Fleming M.D.   On: 09/19/2022 05:52   DG Chest Port 1 View  Result Date: 09/19/2022 CLINICAL DATA:  Suspected sepsis.  Fever, emesis, sore throat EXAM: PORTABLE CHEST 1 VIEW COMPARISON:  07/15/2022 FINDINGS: Stable cardiomediastinal silhouette. No focal consolidation or pneumothorax. Stable trace left pleural effusion or pleural thickening. Neurostimulator and spinal fusion hardware. No acute osseous abnormality. IMPRESSION: No active disease. Electronically Signed   By: Minerva Fester M.D.   On: 09/19/2022 01:07    Procedures Procedures    Medications Ordered in ED Medications  lactated ringers bolus 1,000 mL (0 mLs Intravenous Stopped 09/19/22 0335)  lactated ringers bolus 1,000 mL (0 mLs Intravenous Stopped 09/19/22 0721)  iohexol (OMNIPAQUE) 350 MG/ML injection 100 mL (100 mLs Intravenous Contrast Given 09/19/22 0502)    ED Course/ Medical Decision Making/ A&P  Medical Decision Making Amount and/or Complexity of Data Reviewed Labs: ordered. Radiology: ordered.  Risk Prescription drug management.  Will eval for infectious etiology vs metabolic vs dehydration. Workup overall reassuring.  I suspect dehydration related to creased intake from sore throat.  Patient did have some soft blood pressures which improved with fluid resuscitation and was observed for another couple hours after that without any recurrent hypotension.  Patient's color is better he feels better is making urine.  No infectious etiology at this time.  Discussed with him and wife and they feel comfortable with discharge with increased hydration and intake at home.  Will return here for any new or worsening symptoms.  Final Clinical Impression(s) / ED Diagnoses Final diagnoses:  Fever, unspecified fever cause  Weakness  Dehydration  Odynophagia    Rx / DC Orders ED  Discharge Orders     None         Witten Certain, Barbara Cower, MD 09/19/22 [phone]

## 2022-09-21 LAB — CULTURE, BLOOD (ROUTINE X 2): Special Requests: ADEQUATE

## 2022-09-23 LAB — CULTURE, BLOOD (ROUTINE X 2): Culture: NO GROWTH

## 2022-09-24 LAB — CULTURE, BLOOD (ROUTINE X 2): Special Requests: ADEQUATE

## 2022-10-22 ENCOUNTER — Other Ambulatory Visit: Payer: Self-pay

## 2022-11-12 ENCOUNTER — Inpatient Hospital Stay: Payer: Medicare Other | Attending: Hematology

## 2022-11-12 DIAGNOSIS — D709 Neutropenia, unspecified: Secondary | ICD-10-CM

## 2022-11-12 DIAGNOSIS — Z808 Family history of malignant neoplasm of other organs or systems: Secondary | ICD-10-CM | POA: Diagnosis not present

## 2022-11-12 DIAGNOSIS — C884 Extranodal marginal zone B-cell lymphoma of mucosa-associated lymphoid tissue [MALT-lymphoma]: Secondary | ICD-10-CM | POA: Insufficient documentation

## 2022-11-12 DIAGNOSIS — Z79899 Other long term (current) drug therapy: Secondary | ICD-10-CM | POA: Insufficient documentation

## 2022-11-12 DIAGNOSIS — Z86711 Personal history of pulmonary embolism: Secondary | ICD-10-CM | POA: Insufficient documentation

## 2022-11-12 DIAGNOSIS — G479 Sleep disorder, unspecified: Secondary | ICD-10-CM | POA: Insufficient documentation

## 2022-11-12 DIAGNOSIS — D696 Thrombocytopenia, unspecified: Secondary | ICD-10-CM | POA: Insufficient documentation

## 2022-11-12 DIAGNOSIS — R0602 Shortness of breath: Secondary | ICD-10-CM | POA: Insufficient documentation

## 2022-11-12 DIAGNOSIS — K5900 Constipation, unspecified: Secondary | ICD-10-CM | POA: Diagnosis not present

## 2022-11-12 DIAGNOSIS — Z8249 Family history of ischemic heart disease and other diseases of the circulatory system: Secondary | ICD-10-CM | POA: Diagnosis not present

## 2022-11-12 DIAGNOSIS — Z87891 Personal history of nicotine dependence: Secondary | ICD-10-CM | POA: Insufficient documentation

## 2022-11-12 DIAGNOSIS — Z807 Family history of other malignant neoplasms of lymphoid, hematopoietic and related tissues: Secondary | ICD-10-CM | POA: Diagnosis not present

## 2022-11-12 DIAGNOSIS — C8307 Small cell B-cell lymphoma, spleen: Secondary | ICD-10-CM

## 2022-11-12 DIAGNOSIS — R519 Headache, unspecified: Secondary | ICD-10-CM | POA: Diagnosis not present

## 2022-11-12 LAB — COMPREHENSIVE METABOLIC PANEL
ALT: 17 U/L (ref 0–44)
AST: 22 U/L (ref 15–41)
Albumin: 4.7 g/dL (ref 3.5–5.0)
Alkaline Phosphatase: 47 U/L (ref 38–126)
Anion gap: 11 (ref 5–15)
BUN: 20 mg/dL (ref 8–23)
CO2: 26 mmol/L (ref 22–32)
Calcium: 9.5 mg/dL (ref 8.9–10.3)
Chloride: 103 mmol/L (ref 98–111)
Creatinine, Ser: 1.43 mg/dL — ABNORMAL HIGH (ref 0.61–1.24)
GFR, Estimated: 52 mL/min — ABNORMAL LOW (ref >60)
Glucose, Bld: 103 mg/dL — ABNORMAL HIGH (ref 70–99)
Potassium: 3.8 mmol/L (ref 3.5–5.1)
Sodium: 140 mmol/L (ref 135–145)
Total Bilirubin: 1 mg/dL (ref 0.3–1.2)
Total Protein: 7.7 g/dL (ref 6.5–8.1)

## 2022-11-12 LAB — CBC WITH DIFFERENTIAL/PLATELET
Abs Immature Granulocytes: 0.01 10*3/uL (ref 0.00–0.07)
Basophils Absolute: 0 10*3/uL (ref 0.0–0.1)
Basophils Relative: 0 %
Eosinophils Absolute: 0 10*3/uL (ref 0.0–0.5)
Eosinophils Relative: 0 %
HCT: 45.6 % (ref 39.0–52.0)
Hemoglobin: 14.7 g/dL (ref 13.0–17.0)
Immature Granulocytes: 0 %
Lymphocytes Relative: 19 %
Lymphs Abs: 0.6 10*3/uL — ABNORMAL LOW (ref 0.7–4.0)
MCH: 30.4 pg (ref 26.0–34.0)
MCHC: 32.2 g/dL (ref 30.0–36.0)
MCV: 94.2 fL (ref 80.0–100.0)
Monocytes Absolute: 0.2 10*3/uL (ref 0.1–1.0)
Monocytes Relative: 8 %
Neutro Abs: 2.2 10*3/uL (ref 1.7–7.7)
Neutrophils Relative %: 73 %
Platelets: 61 10*3/uL — ABNORMAL LOW (ref 150–400)
RBC: 4.84 MIL/uL (ref 4.22–5.81)
RDW: 13.6 % (ref 11.5–15.5)
WBC: 3 10*3/uL — ABNORMAL LOW (ref 4.0–10.5)
nRBC: 0 % (ref 0.0–0.2)

## 2022-11-12 LAB — LACTATE DEHYDROGENASE: LDH: 123 U/L (ref 98–192)

## 2022-11-18 NOTE — Progress Notes (Signed)
[facility] [address], [place] [address]    Clinic Day:  11/19/2022  Referring physician: Elfredia Nevins, MD  Patient Care Team: Elfredia Nevins, MD as PCP - General (Internal Medicine) Little Ishikawa, MD as PCP - Cardiology (Cardiology) Doreatha Massed, MD as Consulting Physician (Hematology)   ASSESSMENT & PLAN:   Assessment: 1.  Moderate thrombocytopenia: -Seen as referral from Dr. Sharyon Medicus office for decreased platelet count of 80 on CBC on 10/21/2018. -Ex-smoker, quit in 1982, smoked 1 to 2 packs/day for 10 years.  He also spent 2 years at [facility]. -CT abdomen on 11/08/2018 showed normal size spleen with no abdominal or pelvic pathology. -BMBX on 03/01/2019 showed slightly hypercellular marrow for age with trilineage hematopoiesis including abundant megakaryocytes and predominantly normal morphology.  Overall myeloid changes are nonspecific and not diagnostic for MDS.  Chromosome analysis was normal.  MDS FISH panel was normal.  Core biopsy showed small lymphoid aggregates, features are not considered specific or diagnostic of lymphoproliferative disorder. -He was treated with pulsed dexamethasone around the 01/27/2020 with improvement of platelet count to 70 K.  He could not tolerate dexamethasone due to sleeplessness. -He was also treated with prednisone 60 mg daily on 05/22/2020.  He took it only for 2 to 3 days and stopped it due to inability to sleep. -Treated with immunoglobulin from 05/14/2020 through 05/18/2020. -PET scan on 06/05/2020 view shows no hypermetabolic lymph nodes in the chest, abdomen or pelvis.  Splenomegaly measuring 18 cm with normal metabolic activity. -Bone marrow biopsy on 06/26/2020 with mildly hypercellular marrow, no significant dysplasia.  Scattered large lymphoid aggregates with a predominance of B cells.  Flow cytometry was nondiagnostic.  There is no expression of CD5 or CD10 on the B cells. 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  However no clonality is demonstrated in the kappa light chain gene (IGK) -Myeloma FISH panel was negative.  Cytogenetics was 53, XY. -He was evaluated as second opinion by Dr. 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 - Received 4 cycles of rituximab from 09/05/2020 through 10/03/2020.   2.  Malignant melanoma: -Melanoma resected on the left side of the abdomen with left axillary sentinel lymph node biopsy at [facility] 14 years ago.  Reportedly stage III.    Plan: 1.  Splenic marginal zone lymphoma: - He does not report any fevers, night sweats or weight loss. - Physical exam today: No palpable splenomegaly.  Left axillary lymph node palpable. - Labs (11/12/2022): Creatinine 1.43.  LFTs are normal.  LDH normal at 123.  Platelet count decreased to 61,000. - He underwent repeat bone marrow biopsy at [facility] in April.  I have reviewed [facility] records.  This showed about 15% lymphoma cells and possible megakaryocytic dysplasia.  Myeloid NGS panel showed TET2 mutation. - CT CAP (09/09/2022): At [facility] showed no lymphadenopathy.  Splenomegaly slightly increased from prior measuring 17 cm. - Dr. Althea Charon is planning to start him on Brukinsa.  I have discussed some of the side effects including increased bleeding risk.  He will start it when he gets the medication. - RTC 4 months for follow-up with repeat labs.  2.  Unprovoked pulmonary embolism (diagnosis 05/05/2021): - Continue Xarelto.  No bleeding issues reported.    Orders Placed This Encounter  Procedures   CBC with Differential/Platelet    Standing Status:   Future  Standing Expiration Date:   11/19/2023    Order Specific Question:   Release to patient    Answer:   Immediate   Comprehensive metabolic panel    Standing Status:   Future    Standing Expiration Date:    11/19/2023    Order Specific Question:   Release to patient    Answer:   Immediate   Lactate dehydrogenase    Standing Status:   Future    Standing Expiration Date:   11/19/2023    Order Specific Question:   Release to patient    Answer:   Immediate      I,Katie Daubenspeck,acting as a scribe for Doreatha Massed, MD.,have documented all relevant documentation on the behalf of Doreatha Massed, MD,as directed by  Doreatha Massed, MD while in the presence of Doreatha Massed, MD.   I, Doreatha Massed MD, have reviewed the above documentation for accuracy and completeness, and I agree with the above.   Doreatha Massed, MD   6/19/20244:57 PM  CHIEF COMPLAINT:   Diagnosis: splenic marginal zone lymphoma    Cancer Staging  No matching staging information was found for the patient.   Prior Therapy: 1. pulsed dexamethasone around the 01/27/2020  2. Prednisone 60 mg in 05/2020  3. immunoglobulin from 05/14/2020 through 05/18/2020 4. Rituximab, 4 cycles, 09/05/2020 - 10/03/2020   Current Therapy:  surveillance   HISTORY OF PRESENT ILLNESS:   Oncology History   No history exists.     INTERVAL HISTORY:   [name] is a 72 y.o. male presenting to clinic today for follow up of splenic marginal zone lymphoma. He was last seen by me on 05/14/22.  Since his last visit, he underwent CT C/A/P at [facility] on 09/09/22 showing: no lymphadenopathy; splenomegaly, slightly increased from prior.  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 "Myeloid and lymphoid NGS panels showed two pathogenic variants in TET2, consistent with those reported previously for this individual ([id number]). With only TET2 mutations, definitive MDS can not be made at this time. "  Today, he states that he is doing  well overall. His appetite level is at 100%. His energy level is at 25%.  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   Coronary artery disease    mild by 09/08/13 cath [facility]   GERD (gastroesophageal reflux disease)    barretts esophagus   H/O hiatal hernia    Headache(784.0)    Hypercholesteremia    Hypertension    PONV (postoperative nausea and vomiting)    trouble breathing after last surgery (at [facility])   Shingles    10   Sleep apnea    not tested   Tumor liver    no problems    Surgical History: Past Surgical History:  Procedure Laterality Date   ABDOMINAL SURGERY     x3   back surgeries     x 9   BACK SURGERY     x8   BIOPSY N/A 03/28/2013  Procedure: Distal Esophageal Biopsy;  Surgeon: Malissa Hippo, MD;  Location: [facility];  Service: Endoscopy;  Laterality: N/A;   BIOPSY  05/30/2016   Procedure: BIOPSY;  Surgeon: Malissa Hippo, MD;  [facility]: [facility];  Service: Endoscopy;;   BIOPSY  02/19/2018   Procedure: BIOPSY;  Surgeon: Malissa Hippo, MD;  [facility];  Service: Endoscopy;;  Barret's esophagus   BIOPSY  08/29/2020   Procedure: BIOPSY;  Surgeon: Malissa Hippo, MD;  [facility];  Service: Endoscopy;;   BIOPSY  12/31/2021   Procedure: BIOPSY;  Surgeon: Dolores Frame, MD;  [facility];  Service: Gastroenterology;;   CARDIAC CATHETERIZATION  2020   CHEST SURGERY     lung incisional hernia for hernia   COLONOSCOPY WITH PROPOFOL N/A 02/19/2018   Procedure: COLONOSCOPY WITH PROPOFOL;  Surgeon: Malissa Hippo, MD;  [facility];  Service: Endoscopy;  Laterality: N/A;  1:00   CORONARY ANGIOPLASTY WITH STENT PLACEMENT  2020   2  blockages: 1 stent   ESOPHAGOGASTRODUODENOSCOPY (EGD) WITH PROPOFOL N/A 03/28/2013   Procedure: ESOPHAGOGASTRODUODENOSCOPY (EGD) WITH PROPOFOL;  Surgeon: Malissa Hippo, MD;  Location: [facility];  Service: Endoscopy;  Laterality: N/A;  GE junction @ 38, proximal  margin @37    ESOPHAGOGASTRODUODENOSCOPY (EGD) WITH PROPOFOL N/A 05/30/2016   Procedure: ESOPHAGOGASTRODUODENOSCOPY (EGD) WITH PROPOFOL;  Surgeon: Malissa Hippo, MD;  [facility];  Service: Endoscopy;  Laterality: N/A;  955   ESOPHAGOGASTRODUODENOSCOPY (EGD) WITH PROPOFOL N/A 02/19/2018   Procedure: ESOPHAGOGASTRODUODENOSCOPY (EGD) WITH PROPOFOL;  Surgeon: Malissa Hippo, MD;  [facility];  Service: Endoscopy;  Laterality: N/A;   ESOPHAGOGASTRODUODENOSCOPY (EGD) WITH PROPOFOL N/A 08/29/2020   Procedure: ESOPHAGOGASTRODUODENOSCOPY (EGD) WITH PROPOFOL;  Surgeon: Malissa Hippo, MD;  [facility];  Service: Endoscopy;  Laterality: N/A;  AM   ESOPHAGOGASTRODUODENOSCOPY (EGD) WITH PROPOFOL N/A 12/31/2021   Procedure: ESOPHAGOGASTRODUODENOSCOPY (EGD) WITH PROPOFOL;  Surgeon: Dolores Frame, MD;  [facility];  Service: Gastroenterology;  Laterality: N/A;  100 ASA 2   HEMORRHOID SURGERY     x2   HERNIA REPAIR     HIATAL HERNIA REPAIR     JOINT REPLACEMENT     lft knee   KNEE ARTHROPLASTY     lft   KNEE ARTHROSCOPY Right    LEG SURGERY     x4 crushed    LITHOTRIPSY     MELANOMA EXCISION     lft abdomen   NECK SURGERY  2012   POLYPECTOMY  02/19/2018   Procedure: POLYPECTOMY;  Surgeon: Malissa Hippo, MD;  [facility];  Service: Endoscopy;;  proximal transverse colon (CB x1), descending colon (CBx3)   POSTERIOR CERVICAL FUSION/FORAMINOTOMY  08/18/2011   Procedure: POSTERIOR CERVICAL FUSION/FORAMINOTOMY LEVEL 3;  Surgeon: Cristi Loron, MD;  Location: [facility];  Service: Neurosurgery;  Laterality: N/A;  Cervical three-four, four-five, five-six posterior cervical   fusion with instrumentation; Right cervical three-four, four-five laminotomy   REVISION TOTAL HIP ARTHROPLASTY Left    SHOULDER ARTHROSCOPY     rt bith defect , spur, rotator cuff   SPINAL CORD STIMULATOR IMPLANT     rt hip   SPINAL CORD STIMULATOR INSERTION N/A 11/03/2016   Procedure: LUMBAR SPINAL CORD STIMULATOR REVISION;  Surgeon: Tressie Stalker, MD;  Location: [facility] OR;  Service: Neurosurgery;  Laterality: N/A;   THYROID LOBECTOMY     rt    Social History: Social History  Socioeconomic History   Marital status: Married    Spouse name: Not on file   Number of children: Not on file   Years of education: Not on file   Highest education level: Not on file  Occupational History   Occupation: retired  Tobacco Use   Smoking status: Former    Packs/day: 4.00    Years: 10.00    Additional pack years: 0.00    Total pack years: 40.00    Types: Cigarettes    Quit date: 08/12/1980    Years since quitting: 42.2   Smokeless tobacco: Never   Tobacco comments:    occ wine  Vaping Use   Vaping Use: Never used  Substance and Sexual Activity   Alcohol use: Not Currently   Drug use: No   Sexual activity: Yes    Birth control/protection: None  Other Topics Concern   Not on file  Social History Narrative   Not on file   Social Determinants of Health   Financial Resource Strain: Low Risk  (05/09/2020)   Overall Financial Resource Strain (CARDIA)    Difficulty of Paying Living Expenses: Not hard at all  Food Insecurity: No Food Insecurity (02/23/2022)   Hunger Vital Sign    Worried About Running Out of Food in the Last Year: Never true    Ran Out of Food in the Last Year: Never true  Transportation Needs: No Transportation Needs (02/23/2022)   PRAPARE - Administrator, Civil Service (Medical): No    Lack of Transportation (Non-Medical): No  Physical Activity: Inactive (05/09/2020)   Exercise Vital Sign    Days of Exercise per Week: 0 days    Minutes of Exercise per  Session: 0 min  Stress: No Stress Concern Present (05/09/2020)   [employer] of Occupational Health - Occupational Stress Questionnaire    Feeling of Stress : Not at all  Social Connections: Moderately Integrated (05/09/2020)   Social Connection and Isolation Panel [NHANES]    Frequency of Communication with Friends and Family: More than three times a week    Frequency of Social Gatherings with Friends and Family: Once a week    Attends Religious Services: More than 4 times per year    Active Member of [employer] or Organizations: No    Attends Banker Meetings: Never    Marital Status: Married  Catering manager Violence: Not At Risk (02/23/2022)   Humiliation, Afraid, Rape, and Kick questionnaire    Fear of Current or Ex-Partner: No    Emotionally Abused: No    Physically Abused: No    Sexually Abused: No    Family History: Family History  Problem Relation Age of Onset   Heart disease Father    Aneurysm Mother    Aneurysm Sister    Heart disease Paternal Grandmother    Heart disease Paternal Grandfather    Heart disease Maternal Grandfather    Heart disease Brother        heart transplant   Aneurysm Other        pt states several members have aortic aneurysm   Skin cancer Other        several members   Lymphoma Brother    Aneurysm Maternal Grandmother     Current Medications:  Current Outpatient Medications:    Bioflavonoid Products (ESTER-C) 500-550 MG TABS, Take 1 tablet by mouth daily., Disp: , Rfl:    Calcium Carbonate-Vitamin D 600-10 MG-MCG TABS, Take 1 tablet by mouth 2 (two) times  daily. , Disp: , Rfl:    dexlansoprazole (DEXILANT) 60 MG capsule, Take 60 mg by mouth daily. , Disp: , Rfl:    diazepam (VALIUM) 10 MG tablet, Take 10 mg by mouth 4 (four) times daily as needed (for back pain)., Disp: , Rfl:    diltiazem (CARDIZEM) 30 MG tablet, Take 30 mg by mouth 4 (four) times daily., Disp: , Rfl:    fexofenadine (ALLEGRA) 180 MG tablet, Take 180 mg by  mouth every evening., Disp: , Rfl:    fish oil-omega-3 fatty acids 1000 MG capsule, Take 1 g by mouth 2 (two) times daily., Disp: , Rfl:    HYDROcodone-acetaminophen (NORCO/VICODIN) 5-325 MG tablet, Take 2 tablets by mouth every 4 (four) hours as needed., Disp: 15 tablet, Rfl: 0   hydrocortisone (CORTEF) 10 MG tablet, Take 20mg  in am and 10 mg PM., Disp: 90 tablet, Rfl: 2   lisinopril (ZESTRIL) 10 MG tablet, Take 10 mg by mouth daily., Disp: , Rfl:    Metamucil Fiber CHEW, Chew 1 each by mouth daily., Disp: , Rfl:    metoCLOPramide (REGLAN) 10 MG tablet, Take 1 tablet by mouth in the morning and at bedtime., Disp: , Rfl:    metoprolol succinate (TOPROL-XL) 25 MG 24 hr tablet, Take 0.5 tablets (12.5 mg total) by mouth daily., Disp: 60 tablet, Rfl: 1   montelukast (SINGULAIR) 10 MG tablet, Take 10 mg by mouth daily. , Disp: , Rfl:    Multiple Vitamin (MULITIVITAMIN WITH MINERALS) TABS, Take 1 tablet by mouth daily., Disp: , Rfl:    ondansetron (ZOFRAN) 8 MG tablet, Take 1 tablet (8 mg total) by mouth every 8 (eight) hours as needed for vomiting or nausea., Disp: 30 tablet, Rfl: 2   oxyCODONE-acetaminophen (PERCOCET) 10-325 MG tablet, Take 1 tablet by mouth every 6 (six) hours as needed for pain., Disp: , Rfl:    Probiotic Product (PROBIOTIC PO), Take 1 tablet by mouth daily. culterelle, Disp: , Rfl:    ranolazine (RANEXA) 500 MG 12 hr tablet, Take 500 mg by mouth 2 (two) times daily., Disp: , Rfl:    rivaroxaban (XARELTO) 20 MG TABS tablet, Take 1 tablet (20 mg total) by mouth daily with supper., Disp: 30 tablet, Rfl: 2   rosuvastatin (CRESTOR) 10 MG tablet, Take 10 mg by mouth every evening., Disp: , Rfl:    Allergies: Allergies  Allergen Reactions   Demerol Other (See Comments)    "Makes me crazy."    REVIEW OF SYSTEMS:   Review of Systems  Constitutional:  Negative for chills, fatigue and fever.  HENT:   Negative for lump/mass, mouth sores, nosebleeds, sore throat and trouble swallowing.    Eyes:  Negative for eye problems.  Respiratory:  Positive for shortness of breath. Negative for cough.   Cardiovascular:  Negative for chest pain, leg swelling and palpitations.  Gastrointestinal:  Positive for constipation. Negative for abdominal pain, diarrhea, nausea and vomiting.  Genitourinary:  Negative for bladder incontinence, difficulty urinating, dysuria, frequency, hematuria and nocturia.   Musculoskeletal:  Negative for arthralgias, back pain, flank pain, myalgias and neck pain.  Skin:  Negative for itching and rash.  Neurological:  Positive for headaches. Negative for dizziness and numbness.  Hematological:  Does not bruise/bleed easily.  Psychiatric/Behavioral:  Positive for sleep disturbance. Negative for depression and suicidal ideas. The patient is not nervous/anxious.   All other systems reviewed and are negative.    VITALS:   Blood pressure 128/85, pulse 64, temperature 98 F (36.7 C),  temperature source Oral, resp. rate 18, SpO2 96 %.  Wt Readings from Last 3 Encounters:  09/19/22 244 lb (110.7 kg)  09/18/22 244 lb 14.9 oz (111.1 kg)  05/14/22 244 lb 14.4 oz (111.1 kg)    There is no height or weight on file to calculate BMI.  Performance status (ECOG): 1 - Symptomatic but completely ambulatory  PHYSICAL EXAM:   Physical Exam Vitals and nursing note reviewed. Exam conducted with a chaperone present.  Constitutional:      Appearance: Normal appearance.  Cardiovascular:     Rate and Rhythm: Normal rate and regular rhythm.     Pulses: Normal pulses.     Heart sounds: Normal heart sounds.  Pulmonary:     Effort: Pulmonary effort is normal.     Breath sounds: Normal breath sounds.  Abdominal:     Palpations: Abdomen is soft. There is no hepatomegaly, splenomegaly or mass.     Tenderness: There is no abdominal tenderness.  Musculoskeletal:     Right lower leg: No edema.     Left lower leg: No edema.  Lymphadenopathy:     Cervical: No cervical adenopathy.      Right cervical: No superficial, deep or posterior cervical adenopathy.    Left cervical: No superficial, deep or posterior cervical adenopathy.     Upper Body:     Right upper body: No supraclavicular or axillary adenopathy.     Left upper body: No supraclavicular or axillary adenopathy.  Neurological:     General: No focal deficit present.     Mental Status: He is alert and oriented to person, place, and time.  Psychiatric:        Mood and Affect: Mood normal.        Behavior: Behavior normal.     LABS:      Latest Ref Rng & Units 11/12/2022    2:19 PM 09/19/2022   12:49 AM 09/01/2022   10:27 AM  CBC  WBC 4.0 - 10.5 K/uL 3.0  7.9  2.0   Hemoglobin 13.0 - 17.0 g/dL 40.9  81.1  91.4   Hematocrit 39.0 - 52.0 % 45.6  40.1  40.3   Platelets 150 - 400 K/uL 61  63  51       Latest Ref Rng & Units 11/12/2022    2:19 PM 09/19/2022   12:49 AM 05/07/2022    1:10 PM  CMP  Glucose 70 - 99 mg/dL 782  956  213   BUN 8 - 23 mg/dL 20  26  20    Creatinine 0.61 - 1.24 mg/dL 0.86  5.78  4.69   Sodium 135 - 145 mmol/L 140  138  138   Potassium 3.5 - 5.1 mmol/L 3.8  3.7  4.1   Chloride 98 - 111 mmol/L 103  104  105   CO2 22 - 32 mmol/L 26  23  25    Calcium 8.9 - 10.3 mg/dL 9.5  9.0  9.0   Total Protein 6.5 - 8.1 g/dL 7.7  6.9  6.8   Total Bilirubin 0.3 - 1.2 mg/dL 1.0  1.0  1.1   Alkaline Phos 38 - 126 U/L 47  47  51   AST 15 - 41 U/L 22  15  24    ALT 0 - 44 U/L 17  14  19       No results found for: "CEA1", "CEA" / No results found for: "CEA1", "CEA" No results found for: "PSA1" No results found for: "GEX528"  No results found for: "CAN125"  Lab Results  Component Value Date   TOTALPROTELP 6.9 06/09/2019   ALBUMINELP 4.4 06/09/2019   A1GS 0.2 06/09/2019   A2GS 0.5 06/09/2019   BETS 0.8 06/09/2019   GAMS 1.0 06/09/2019   MSPIKE Not Observed 06/09/2019   SPEI Comment 06/09/2019   Lab Results  Component Value Date   TIBC 105 (L) 02/06/2022   FERRITIN 451 (H) 02/06/2022    IRONPCTSAT 18 02/06/2022   Lab Results  Component Value Date   LDH 123 11/12/2022   LDH 115 05/07/2022   LDH 125 10/23/2021     STUDIES:   No results found.

## 2022-11-19 ENCOUNTER — Inpatient Hospital Stay (HOSPITAL_BASED_OUTPATIENT_CLINIC_OR_DEPARTMENT_OTHER): Payer: Medicare Other | Admitting: Hematology

## 2022-11-19 VITALS — BP 128/85 | HR 64 | Temp 98.0°F | Resp 18

## 2022-11-19 DIAGNOSIS — D696 Thrombocytopenia, unspecified: Secondary | ICD-10-CM | POA: Diagnosis not present

## 2022-11-19 DIAGNOSIS — C884 Extranodal marginal zone B-cell lymphoma of mucosa-associated lymphoid tissue [MALT-lymphoma]: Secondary | ICD-10-CM | POA: Diagnosis not present

## 2022-11-19 DIAGNOSIS — C8307 Small cell B-cell lymphoma, spleen: Secondary | ICD-10-CM

## 2022-11-19 NOTE — Patient Instructions (Addendum)
[facility]  Discharge Instructions  You were seen and examined today by Dr. Ellin Saba.  Dr. Ellin Saba discussed your most recent lab work which revealed that everything looks stable.  Brukinsa is the medication that you will be started on by [facility]. Just be careful of bleeding risk with combination of the Brukinsa and the Xarelto.  Follow-up as scheduled in 4 months.    Thank you for choosing [facility] to provide your oncology and hematology care.   To afford each patient quality time with our provider, please arrive at least 15 minutes before your scheduled appointment time. You may need to reschedule your appointment if you arrive late (10 or more minutes). Arriving late affects you and other patients whose appointments are after yours.  Also, if you miss three or more appointments without notifying the office, you may be dismissed from the clinic at the provider's discretion.    Again, thank you for choosing [facility].  Our hope is that these requests will decrease the amount of time that you wait before being seen by our physicians.   If you have a lab appointment with the Cancer Center - please note that after April 8th, all labs will be drawn in the cancer center.  You do not have to check in or register with the main entrance as you have in the past but will complete your check-in at the cancer center.            _____________________________________________________________  Should you have questions after your visit to [facility], please contact our office at [phone] and follow the prompts.  Our office hours are 8:00 a.m. to 4:30 p.m. Monday - Thursday and 8:00 a.m. to 2:30 p.m. Friday.  Please note that voicemails left after 4:00 p.m. may not be returned until the following business day.  We are closed weekends and all major holidays.  You do have access to a nurse 24-7, just call the main  number to the clinic [phone] and do not press any options, hold on the line and a nurse will answer the phone.    For prescription refill requests, have your pharmacy contact our office and allow 72 hours.    Masks are no longer required in the cancer centers. If you would like for your care team to wear a mask while they are taking care of you, please let them know. You may have one support person who is at least 71 years old accompany you for your appointments.

## 2023-01-08 ENCOUNTER — Other Ambulatory Visit: Payer: Self-pay

## 2023-01-08 ENCOUNTER — Observation Stay (HOSPITAL_COMMUNITY)
Admission: EM | Admit: 2023-01-08 | Discharge: 2023-01-09 | Disposition: A | Discharge status: Home / Self Care | Payer: Medicare Other | Attending: Family Medicine | Admitting: Family Medicine

## 2023-01-08 ENCOUNTER — Emergency Department (HOSPITAL_COMMUNITY): Payer: Medicare Other

## 2023-01-08 ENCOUNTER — Encounter (HOSPITAL_COMMUNITY): Payer: Self-pay | Admitting: Internal Medicine

## 2023-01-08 DIAGNOSIS — I480 Paroxysmal atrial fibrillation: Secondary | ICD-10-CM | POA: Diagnosis present

## 2023-01-08 DIAGNOSIS — M549 Dorsalgia, unspecified: Secondary | ICD-10-CM | POA: Diagnosis not present

## 2023-01-08 DIAGNOSIS — I714 Abdominal aortic aneurysm, without rupture, unspecified: Secondary | ICD-10-CM | POA: Diagnosis not present

## 2023-01-08 DIAGNOSIS — E785 Hyperlipidemia, unspecified: Secondary | ICD-10-CM | POA: Diagnosis present

## 2023-01-08 DIAGNOSIS — Z955 Presence of coronary angioplasty implant and graft: Secondary | ICD-10-CM | POA: Insufficient documentation

## 2023-01-08 DIAGNOSIS — Z96642 Presence of left artificial hip joint: Secondary | ICD-10-CM | POA: Insufficient documentation

## 2023-01-08 DIAGNOSIS — Z86711 Personal history of pulmonary embolism: Secondary | ICD-10-CM | POA: Diagnosis not present

## 2023-01-08 DIAGNOSIS — R319 Hematuria, unspecified: Secondary | ICD-10-CM | POA: Diagnosis present

## 2023-01-08 DIAGNOSIS — Z85828 Personal history of other malignant neoplasm of skin: Secondary | ICD-10-CM | POA: Insufficient documentation

## 2023-01-08 DIAGNOSIS — K219 Gastro-esophageal reflux disease without esophagitis: Secondary | ICD-10-CM | POA: Diagnosis present

## 2023-01-08 DIAGNOSIS — Z85830 Personal history of malignant neoplasm of bone: Secondary | ICD-10-CM | POA: Insufficient documentation

## 2023-01-08 DIAGNOSIS — I2510 Atherosclerotic heart disease of native coronary artery without angina pectoris: Secondary | ICD-10-CM | POA: Diagnosis not present

## 2023-01-08 DIAGNOSIS — N1831 Chronic kidney disease, stage 3a: Secondary | ICD-10-CM | POA: Diagnosis not present

## 2023-01-08 DIAGNOSIS — G8929 Other chronic pain: Secondary | ICD-10-CM | POA: Insufficient documentation

## 2023-01-08 DIAGNOSIS — Z96652 Presence of left artificial knee joint: Secondary | ICD-10-CM | POA: Insufficient documentation

## 2023-01-08 DIAGNOSIS — Z7901 Long term (current) use of anticoagulants: Secondary | ICD-10-CM

## 2023-01-08 DIAGNOSIS — Z79899 Other long term (current) drug therapy: Secondary | ICD-10-CM | POA: Insufficient documentation

## 2023-01-08 DIAGNOSIS — Z8505 Personal history of malignant neoplasm of liver: Secondary | ICD-10-CM | POA: Insufficient documentation

## 2023-01-08 DIAGNOSIS — D61818 Other pancytopenia: Secondary | ICD-10-CM | POA: Diagnosis not present

## 2023-01-08 DIAGNOSIS — Z87891 Personal history of nicotine dependence: Secondary | ICD-10-CM | POA: Insufficient documentation

## 2023-01-08 DIAGNOSIS — C8307 Small cell B-cell lymphoma, spleen: Secondary | ICD-10-CM | POA: Diagnosis present

## 2023-01-08 DIAGNOSIS — I10 Essential (primary) hypertension: Secondary | ICD-10-CM | POA: Diagnosis present

## 2023-01-08 DIAGNOSIS — I129 Hypertensive chronic kidney disease with stage 1 through stage 4 chronic kidney disease, or unspecified chronic kidney disease: Secondary | ICD-10-CM | POA: Diagnosis not present

## 2023-01-08 LAB — COMPREHENSIVE METABOLIC PANEL
ALT: 19 U/L (ref 0–44)
AST: 23 U/L (ref 15–41)
Albumin: 4.4 g/dL (ref 3.5–5.0)
Alkaline Phosphatase: 47 U/L (ref 38–126)
Anion gap: 9 (ref 5–15)
BUN: 24 mg/dL — ABNORMAL HIGH (ref 8–23)
CO2: 24 mmol/L (ref 22–32)
Calcium: 9.1 mg/dL (ref 8.9–10.3)
Chloride: 108 mmol/L (ref 98–111)
Creatinine, Ser: 1.38 mg/dL — ABNORMAL HIGH (ref 0.61–1.24)
GFR, Estimated: 55 mL/min — ABNORMAL LOW (ref >60)
Glucose, Bld: 89 mg/dL (ref 70–99)
Potassium: 4.1 mmol/L (ref 3.5–5.1)
Sodium: 141 mmol/L (ref 135–145)
Total Bilirubin: 1.3 mg/dL — ABNORMAL HIGH (ref 0.3–1.2)
Total Protein: 7.1 g/dL (ref 6.5–8.1)

## 2023-01-08 LAB — CBC WITH DIFFERENTIAL/PLATELET
Abs Immature Granulocytes: 0.02 10*3/uL (ref 0.00–0.07)
Basophils Absolute: 0 10*3/uL (ref 0.0–0.1)
Basophils Relative: 0 %
Eosinophils Absolute: 0 10*3/uL (ref 0.0–0.5)
Eosinophils Relative: 0 %
HCT: 42 % (ref 39.0–52.0)
Hemoglobin: 13.8 g/dL (ref 13.0–17.0)
Immature Granulocytes: 1 %
Lymphocytes Relative: 26 %
Lymphs Abs: 1 10*3/uL (ref 0.7–4.0)
MCH: 29.9 pg (ref 26.0–34.0)
MCHC: 32.9 g/dL (ref 30.0–36.0)
MCV: 91.1 fL (ref 80.0–100.0)
Monocytes Absolute: 0.3 10*3/uL (ref 0.1–1.0)
Monocytes Relative: 9 %
Neutro Abs: 2.4 10*3/uL (ref 1.7–7.7)
Neutrophils Relative %: 64 %
Platelets: 46 10*3/uL — ABNORMAL LOW (ref 150–400)
RBC: 4.61 MIL/uL (ref 4.22–5.81)
RDW: 13.3 % (ref 11.5–15.5)
WBC: 3.7 10*3/uL — ABNORMAL LOW (ref 4.0–10.5)
nRBC: 0 % (ref 0.0–0.2)

## 2023-01-08 LAB — URINALYSIS, MICROSCOPIC (REFLEX): RBC / HPF: >50 RBC/hpf (ref 0–5)

## 2023-01-08 LAB — URINALYSIS, ROUTINE W REFLEX MICROSCOPIC
Glucose, UA: NEGATIVE mg/dL
Ketones, ur: NEGATIVE mg/dL
Leukocytes,Ua: NEGATIVE
Nitrite: POSITIVE — AB
Protein, ur: 30 mg/dL — AB
Specific Gravity, Urine: >1.03 — ABNORMAL HIGH (ref 1.005–1.030)
pH: 6 (ref 5.0–8.0)

## 2023-01-08 LAB — URINALYSIS, W/ REFLEX TO CULTURE (INFECTION SUSPECTED)
Bacteria, UA: NONE SEEN
Bilirubin Urine: NEGATIVE
Glucose, UA: NEGATIVE mg/dL
Ketones, ur: NEGATIVE mg/dL
Leukocytes,Ua: NEGATIVE
Nitrite: NEGATIVE
Protein, ur: NEGATIVE mg/dL
RBC / HPF: >50 RBC/hpf (ref 0–5)
Specific Gravity, Urine: 1.011 (ref 1.005–1.030)
pH: 7 (ref 5.0–8.0)

## 2023-01-08 LAB — HEMOGLOBIN AND HEMATOCRIT, BLOOD
HCT: 36.9 % — ABNORMAL LOW (ref 39.0–52.0)
Hemoglobin: 12.3 g/dL — ABNORMAL LOW (ref 13.0–17.0)

## 2023-01-08 LAB — PROTIME-INR
INR: 2.2 — ABNORMAL HIGH (ref 0.8–1.2)
Prothrombin Time: 24.7 seconds — ABNORMAL HIGH (ref 11.4–15.2)

## 2023-01-08 MED ORDER — HYDROCORTISONE 10 MG PO TABS
10.0000 mg | ORAL_TABLET | Freq: Every day | ORAL | Status: DC
  Filled 2023-01-08: qty 1

## 2023-01-08 MED ORDER — CYCLOBENZAPRINE HCL 10 MG PO TABS
10.0000 mg | ORAL_TABLET | Freq: Two times a day (BID) | ORAL | Status: DC
  Administered 2023-01-08 – 2023-01-09 (×2): 10 mg via ORAL
  Filled 2023-01-08 (×2): qty 1

## 2023-01-08 MED ORDER — HYDRALAZINE HCL 20 MG/ML IJ SOLN: 10.0000 mg | INTRAMUSCULAR | Status: DC | PRN

## 2023-01-08 MED ORDER — SODIUM CHLORIDE 0.9 % IV SOLN
1.0000 g | INTRAVENOUS | Status: DC
  Administered 2023-01-08: 1 g via INTRAVENOUS
  Filled 2023-01-08: qty 10

## 2023-01-08 MED ORDER — ROSUVASTATIN CALCIUM 5 MG PO TABS
5.0000 mg | ORAL_TABLET | Freq: Every day | ORAL | Status: DC
  Administered 2023-01-09: 5 mg via ORAL
  Filled 2023-01-08: qty 1

## 2023-01-08 MED ORDER — ACETAMINOPHEN 650 MG RE SUPP: 650.0000 mg | Freq: Four times a day (QID) | RECTAL | Status: DC | PRN

## 2023-01-08 MED ORDER — HYDROCORTISONE 20 MG PO TABS
20.0000 mg | ORAL_TABLET | Freq: Every day | ORAL | Status: DC
  Administered 2023-01-09: 20 mg via ORAL
  Filled 2023-01-08: qty 1

## 2023-01-08 MED ORDER — SACCHAROMYCES BOULARDII 250 MG PO CAPS
250.0000 mg | ORAL_CAPSULE | Freq: Every day | ORAL | Status: DC
  Administered 2023-01-09: 250 mg via ORAL
  Filled 2023-01-08: qty 1

## 2023-01-08 MED ORDER — SODIUM CHLORIDE 0.9% FLUSH
3.0000 mL | Freq: Two times a day (BID) | INTRAVENOUS | Status: DC
  Administered 2023-01-08 – 2023-01-09 (×3): 3 mL via INTRAVENOUS

## 2023-01-08 MED ORDER — DIAZEPAM 2 MG PO TABS: 10.0000 mg | ORAL_TABLET | Freq: Three times a day (TID) | ORAL | Status: DC | PRN

## 2023-01-08 MED ORDER — PANTOPRAZOLE SODIUM 40 MG PO TBEC
80.0000 mg | DELAYED_RELEASE_TABLET | Freq: Every day | ORAL | Status: DC
  Administered 2023-01-09: 80 mg via ORAL
  Filled 2023-01-08: qty 2

## 2023-01-08 MED ORDER — METOCLOPRAMIDE HCL 5 MG PO TABS
10.0000 mg | ORAL_TABLET | Freq: Two times a day (BID) | ORAL | Status: DC
  Administered 2023-01-08 – 2023-01-09 (×2): 10 mg via ORAL
  Filled 2023-01-08 (×2): qty 2

## 2023-01-08 MED ORDER — ACETAMINOPHEN 325 MG PO TABS: 650.0000 mg | ORAL_TABLET | Freq: Four times a day (QID) | ORAL | Status: DC | PRN

## 2023-01-08 MED ORDER — HYDROCORTISONE 10 MG PO TABS: 10.0000 mg | ORAL_TABLET | ORAL | Status: DC

## 2023-01-08 MED ORDER — RIVAROXABAN 20 MG PO TABS
20.0000 mg | ORAL_TABLET | Freq: Every day | ORAL | Status: DC
  Administered 2023-01-08: 20 mg via ORAL
  Filled 2023-01-08: qty 1

## 2023-01-08 MED ORDER — DILTIAZEM HCL 30 MG PO TABS
30.0000 mg | ORAL_TABLET | Freq: Two times a day (BID) | ORAL | Status: DC
  Administered 2023-01-08 – 2023-01-09 (×2): 30 mg via ORAL
  Filled 2023-01-08 (×2): qty 1

## 2023-01-08 MED ORDER — ALBUTEROL SULFATE (2.5 MG/3ML) 0.083% IN NEBU: 2.5000 mg | INHALATION_SOLUTION | Freq: Four times a day (QID) | RESPIRATORY_TRACT | Status: DC | PRN

## 2023-01-08 MED ORDER — RANOLAZINE ER 500 MG PO TB12
500.0000 mg | ORAL_TABLET | Freq: Two times a day (BID) | ORAL | Status: DC
  Administered 2023-01-08 – 2023-01-09 (×2): 500 mg via ORAL
  Filled 2023-01-08 (×3): qty 1

## 2023-01-08 MED ORDER — LISINOPRIL 10 MG PO TABS
10.0000 mg | ORAL_TABLET | Freq: Every day | ORAL | Status: DC
  Administered 2023-01-09: 10 mg via ORAL
  Filled 2023-01-08: qty 1

## 2023-01-08 MED ORDER — MONTELUKAST SODIUM 10 MG PO TABS
10.0000 mg | ORAL_TABLET | Freq: Every day | ORAL | Status: DC
  Administered 2023-01-08 – 2023-01-09 (×2): 10 mg via ORAL
  Filled 2023-01-08 (×2): qty 1

## 2023-01-08 NOTE — ED Provider Notes (Signed)
[place] EMERGENCY DEPARTMENT AT [facility] Provider Note   CSN: [id number] Arrival date & time: 01/08/23  6440     History  Chief Complaint  Patient presents with   Hematuria    MONTARIO DEVOL is a 72 y.o. male.  HPI 72 year old male presents today complaining of hematuria.  He has had 2 episodes this morning.  He is not having any pain associated with this.  He is on Xarelto for a PE.  He has seen urology in the past for difficulty voiding.  He has not had previous known hematuria or kidney stones.  He is being treated for lymphoma and has had some recent medication changes.  He denies any fever, chills, nausea, vomiting, pain with urination, frequency of urination.  He did have low platelets in the past several months he states the lowest was 24,000 but they were recently up into around 80,000.  He has not noted any other bleeding.  Specifically he has not noted any blood in his stool or petechia.  He has some areas of his forearms that have some bruising that he states has been his skin has been for the past several years    Home Medications Prior to Admission medications   Medication Sig Start Date End Date Taking? Authorizing Provider  Bioflavonoid Products (ESTER-C) 500-550 MG TABS Take 1 tablet by mouth daily.    [provider]  Calcium Carbonate-Vitamin D 600-10 MG-MCG TABS Take 1 tablet by mouth 2 (two) times daily.     [provider]  dexlansoprazole (DEXILANT) 60 MG capsule Take 60 mg by mouth daily.     [provider]  diazepam (VALIUM) 10 MG tablet Take 10 mg by mouth 4 (four) times daily as needed (for back pain).    [provider]  diltiazem (CARDIZEM) 30 MG tablet Take 30 mg by mouth 4 (four) times daily.    [provider]  fexofenadine (ALLEGRA) 180 MG tablet Take 180 mg by mouth every evening.    [provider]  fish oil-omega-3 fatty acids 1000 MG capsule Take 1 g by mouth 2 (two) times daily.     [provider]  HYDROcodone-acetaminophen (NORCO/VICODIN) 5-325 MG tablet Take 2 tablets by mouth every 4 (four) hours as needed. 07/15/22   Rondel Baton, MD  hydrocortisone (CORTEF) 10 MG tablet Take 20mg  in am and 10 mg PM. 02/28/22   Vassie Loll, MD  lisinopril (ZESTRIL) 10 MG tablet Take 10 mg by mouth daily.    [provider]  Metamucil Fiber CHEW Chew 1 each by mouth daily.    [provider]  metoCLOPramide (REGLAN) 10 MG tablet Take 1 tablet by mouth in the morning and at bedtime. 08/03/20   [provider]  metoprolol succinate (TOPROL-XL) 25 MG 24 hr tablet Take 0.5 tablets (12.5 mg total) by mouth daily. 03/01/22   Vassie Loll, MD  montelukast (SINGULAIR) 10 MG tablet Take 10 mg by mouth daily.     [provider]  Multiple Vitamin (MULITIVITAMIN WITH MINERALS) TABS Take 1 tablet by mouth daily.    [provider]  ondansetron (ZOFRAN) 8 MG tablet Take 1 tablet (8 mg total) by mouth every 8 (eight) hours as needed for vomiting or nausea. 11/18/21   Dolores Frame, MD  oxyCODONE-acetaminophen (PERCOCET) 10-325 MG tablet Take 1 tablet by mouth every 6 (six) hours as needed for pain.    [provider]  Probiotic Product (PROBIOTIC PO) Take  1 tablet by mouth daily. culterelle    [provider]  ranolazine (RANEXA) 500 MG 12 hr tablet Take 500 mg by mouth 2 (two) times daily. 04/23/20   [provider]  rivaroxaban (XARELTO) 20 MG TABS tablet Take 1 tablet (20 mg total) by mouth daily with supper. 05/29/21   Sherryll Burger, Pratik D, DO  rosuvastatin (CRESTOR) 10 MG tablet Take 10 mg by mouth every evening.    [provider]      Allergies    Demerol    Review of Systems   Review of Systems  Physical Exam Updated Vital Signs BP (!) 148/78   Pulse (!) 57   Temp 98 F (36.7 C) (Oral)   Resp 18   Ht 1.829 m (6')   Wt 109.3 kg   SpO2 94%   BMI 32.69 kg/m  Physical Exam Vitals  reviewed.  Constitutional:      Appearance: Normal appearance.  HENT:     Head: Normocephalic.     Right Ear: External ear normal.     Left Ear: External ear normal.     Nose: Nose normal.     Mouth/Throat:     Pharynx: Oropharynx is clear.  Eyes:     Pupils: Pupils are equal, round, and reactive to light.  Cardiovascular:     Rate and Rhythm: Normal rate and regular rhythm.     Pulses: Normal pulses.  Pulmonary:     Effort: Pulmonary effort is normal.  Abdominal:     General: Bowel sounds are normal. There is no distension.     Palpations: Abdomen is soft.     Tenderness: There is no abdominal tenderness.  Musculoskeletal:        General: Normal range of motion.     Cervical back: Normal range of motion.  Skin:    General: Skin is warm.     Capillary Refill: Capillary refill takes less than 2 seconds.     Comments: Some bruising on forearms no petechia noted  Neurological:     General: No focal deficit present.     Mental Status: He is alert.  Psychiatric:        Mood and Affect: Mood normal.     ED Results / Procedures / Treatments   Labs (all labs ordered are listed, but only abnormal results are displayed) Labs Reviewed  URINALYSIS, ROUTINE W REFLEX MICROSCOPIC - Abnormal; Notable for the following components:      Result Value   Color, Urine BROWN (*)    APPearance CLOUDY (*)    Specific Gravity, Urine >1.030 (*)    Hgb urine dipstick LARGE (*)    Bilirubin Urine SMALL (*)    Protein, ur 30 (*)    Nitrite POSITIVE (*)    All other components within normal limits  CBC WITH DIFFERENTIAL/PLATELET - Abnormal; Notable for the following components:   WBC 3.7 (*)    Platelets 46 (*)    All other components within normal limits  COMPREHENSIVE METABOLIC PANEL - Abnormal; Notable for the following components:   BUN 24 (*)    Creatinine, Ser 1.38 (*)    Total Bilirubin 1.3 (*)    GFR, Estimated 55 (*)    All other components within normal limits  PROTIME-INR -  Abnormal; Notable for the following components:   Prothrombin Time 24.7 (*)    INR 2.2 (*)    All other components within normal limits  URINALYSIS, MICROSCOPIC (REFLEX) - Abnormal; Notable for the  following components:   Bacteria, UA RARE (*)    All other components within normal limits  URINALYSIS, W/ REFLEX TO CULTURE (INFECTION SUSPECTED)    EKG None  Radiology CT Renal Stone Study  Result Date: 01/08/2023 CLINICAL DATA:  Hematuria this morning.  History of kidney stones. EXAM: CT ABDOMEN AND PELVIS WITHOUT CONTRAST TECHNIQUE: Multidetector CT imaging of the abdomen and pelvis was performed following the standard protocol without IV contrast. RADIATION DOSE REDUCTION: This exam was performed according to the departmental dose-optimization program which includes automated exposure control, adjustment of the mA and/or kV according to patient size and/or use of iterative reconstruction technique. COMPARISON:  CT chest, abdomen, and pelvis dated September 19, 2022. FINDINGS: Lower chest: No acute abnormality. Hepatobiliary: No focal liver abnormality is seen. No gallstones, gallbladder wall thickening, or biliary dilatation. Pancreas: Unremarkable. No pancreatic ductal dilatation or surrounding inflammatory changes. Spleen: Unchanged splenomegaly.  No focal lesion. Adrenals/Urinary Tract: The adrenal glands are unremarkable. No renal calculi or hydronephrosis. Unchanged 4.1 cm left renal simple cyst. No follow-up imaging is recommended. The bladder is decompressed. Stomach/Bowel: Similar postsurgical changes at the gastroesophageal junction with unchanged small to moderate hiatal hernia. The stomach is otherwise within normal limits. No bowel wall thickening, distention, or surrounding inflammatory changes. Left-sided colonic diverticulosis. Diminutive or absent appendix. Vascular/Lymphatic: Aortic atherosclerosis. No enlarged abdominal or pelvic lymph nodes. Reproductive: Prostate is unremarkable. Other:  Unchanged small fat containing right greater than left inguinal hernias. Unchanged tiny fat containing umbilical hernia. Prior ventral hernia repair. No free fluid or pneumoperitoneum. Musculoskeletal: No acute or significant osseous findings. Prior lumbar fusion and left total hip arthroplasty. IMPRESSION: 1. No acute intra-abdominal process. No urolithiasis. 2. Unchanged splenomegaly. 3. Unchanged small to moderate hiatal hernia. 4.  Aortic Atherosclerosis (ICD10-I70.0). Electronically Signed   By: Obie Dredge M.D.   On: 01/08/2023 10:27    Procedures Procedures    Medications Ordered in ED Medications - No data to display  ED Course/ Medical Decision Making/ A&P Clinical Course as of 01/08/23 1224  Thu Jan 08, 2023  1027 Complete metabolic panel reviewed interprets and friendly for mildly elevated creatinine at 1.38 improved from first prior creatinines of 1 3 and 6 months ago [DR]  1038 CBC reviewed interpreted significant for mild leukopenia white count 3700, normal hemoglobin at 13.8 and thrombocytopenia with platelets 46,000 which is slightly decreased from 1 month ago when platelets were 61,000 [DR]  1039 CT abdomen pelvis without contrast obtained shows no acute intra-abdominal process, no ureterolithiasis, unchanged splenomegaly and small to moderate hiatal hernia without aortic atherosclerosis and no other acute abnormalities reported [DR]    Clinical Course User Index [DR] Margarita Grizzle, MD                                 Medical Decision Making Amount and/or Complexity of Data Reviewed Labs: ordered. Radiology: ordered.  Patient presents with painless hematuria on blood thinners DDX clues but is not limited to Medication side effect- patient on brukenza (sp?) Thrombocytopenia Other primary blood etiologies Urinary tract infection Urinary tract hemorrhage Oncologic etiology such as tumor Patient is being evaluated with CBC, chemistry, urinalysis, and CT scan Care  discussed with urology- Cam, PA Discussed with Dr. Morton Amy for admission       Final Clinical Impression(s) / ED Diagnoses Final diagnoses:  Hematuria, unspecified type  Chronic anticoagulation    Rx / DC Orders ED Discharge Orders  None         Margarita Grizzle, MD 01/08/23 1228

## 2023-01-08 NOTE — ED Triage Notes (Signed)
Pt. 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 I also take Xeralato blood thinner . I called [facility] that's treating my cancer and said to come here.

## 2023-01-08 NOTE — H&P (Addendum)
History and Physical    Patient: [name] MWN:[id number] DOB: [birth year] DOA: 01/08/2023 DOS: the patient was seen and examined on 01/08/2023 PCP: Elfredia Nevins, MD  Patient coming from: Home  Chief Complaint:  Chief Complaint  Patient presents with   Hematuria   HPI: [name] is a 72 y.o. male with medical history significant of hypertension, hyperlipidemia, CAD, paroxysmal atrial fibrillation on anticoagulation, pulmonary embolism , thrombocytopenia, splenic marginal zone B-cell lymphoma, melanoma, hiatal hernia s/p Elsen fundoplication, GERD who presents with complaints of blood in urine.  Symptoms started this morning.  When he initially urinated he stated that it was dark red blood.  However, since that time his pee has lightened in color although still reports being red.  Not having any pain or difficulty voiding.  Denies having any fever, lightheadedness, chest pain, nausea, vomiting, abdominal pain, blood in stools, or diarrhea.  Patient had just recently been started on Bruskinsa to treat his lymphoma.  His daughter makes note that during his previous hospitalization in in September of last year he had to be hospitalized back-to-back after being found to have severe sepsis.  During the hospitalizations he was found to be in atrial fibrillation and started on Xarelto.  In the emergency department patient noted to be afebrile with pulse 57-80, and all other vital signs maintained.  Labs significant for WBC 3.7, platelets 46, BUN 24, creatinine 1.38, and INR 2.2.  Renal CT scan study did not show any signs of kidney stones or acute intra-abdominal process.  Urology had been consulted, but did not recommend any surgical intervention.  Review of Systems: As mentioned in the history of present illness. All other systems reviewed and are negative. Past Medical History:  Diagnosis Date   Anxiety    patient denies   Aortic aneurysm (HCC)    ascending   Arthritis    Barrett  esophagus    Blood transfusion    Cancer (HCC) 2007   melanoma stage 3 stomach bx   Cancer (HCC)    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   Coronary artery disease    mild by 09/08/13 cath [facility]   GERD (gastroesophageal reflux disease)    barretts esophagus   H/O hiatal hernia    Headache(784.0)    Hypercholesteremia    Hypertension    PONV (postoperative nausea and vomiting)    trouble breathing after last surgery (at [facility])   Shingles    10   Sleep apnea    not tested   Tumor liver    no problems   Past Surgical History:  Procedure Laterality Date   ABDOMINAL SURGERY     x3   back surgeries     x 9   BACK SURGERY     x8   BIOPSY N/A 03/28/2013   Procedure: Distal Esophageal Biopsy;  Surgeon: Malissa Hippo, MD;  Location: [facility];  Service: Endoscopy;  Laterality: N/A;   BIOPSY  05/30/2016   Procedure: BIOPSY;  Surgeon: Malissa Hippo, MD;  Location: [facility];  Service: Endoscopy;;   BIOPSY  02/19/2018   Procedure: BIOPSY;  Surgeon: Malissa Hippo, MD;  Location: [facility];  Service: Endoscopy;;  Barret's esophagus   BIOPSY  08/29/2020   Procedure: BIOPSY;  Surgeon: Malissa Hippo, MD;  Location: [facility];  Service: Endoscopy;;   BIOPSY  12/31/2021   Procedure: BIOPSY;  Surgeon: Dolores Frame, MD;  Location: [facility];  Service: Gastroenterology;;  CARDIAC CATHETERIZATION  2020   CHEST SURGERY     lung incisional hernia for hernia   COLONOSCOPY WITH PROPOFOL N/A 02/19/2018   Procedure: COLONOSCOPY WITH PROPOFOL;  Surgeon: Malissa Hippo, MD;  [facility]: [facility];  Service: Endoscopy;  Laterality: N/A;  1:00   CORONARY ANGIOPLASTY WITH STENT PLACEMENT  2020   2 blockages: 1 stent   ESOPHAGOGASTRODUODENOSCOPY (EGD) WITH PROPOFOL N/A 03/28/2013   Procedure: ESOPHAGOGASTRODUODENOSCOPY (EGD) WITH PROPOFOL;  Surgeon: Malissa Hippo, MD;  Location: [facility];  Service: Endoscopy;  Laterality: N/A;  GE junction @ 38, proximal  margin @37     ESOPHAGOGASTRODUODENOSCOPY (EGD) WITH PROPOFOL N/A 05/30/2016   Procedure: ESOPHAGOGASTRODUODENOSCOPY (EGD) WITH PROPOFOL;  Surgeon: Malissa Hippo, MD;  [facility];  Service: Endoscopy;  Laterality: N/A;  955   ESOPHAGOGASTRODUODENOSCOPY (EGD) WITH PROPOFOL N/A 02/19/2018   Procedure: ESOPHAGOGASTRODUODENOSCOPY (EGD) WITH PROPOFOL;  Surgeon: Malissa Hippo, MD;  [facility];  Service: Endoscopy;  Laterality: N/A;   ESOPHAGOGASTRODUODENOSCOPY (EGD) WITH PROPOFOL N/A 08/29/2020   Procedure: ESOPHAGOGASTRODUODENOSCOPY (EGD) WITH PROPOFOL;  Surgeon: Malissa Hippo, MD;  [facility];  Service: Endoscopy;  Laterality: N/A;  AM   ESOPHAGOGASTRODUODENOSCOPY (EGD) WITH PROPOFOL N/A 12/31/2021   Procedure: ESOPHAGOGASTRODUODENOSCOPY (EGD) WITH PROPOFOL;  Surgeon: Dolores Frame, MD;  [facility];  Service: Gastroenterology;  Laterality: N/A;  100 ASA 2   HEMORRHOID SURGERY     x2   HERNIA REPAIR     HIATAL HERNIA REPAIR     JOINT REPLACEMENT     lft knee   KNEE ARTHROPLASTY     lft   KNEE ARTHROSCOPY Right    LEG SURGERY     x4 crushed    LITHOTRIPSY     MELANOMA EXCISION     lft abdomen   NECK SURGERY  2012   POLYPECTOMY  02/19/2018   Procedure: POLYPECTOMY;  Surgeon: Malissa Hippo, MD;  Location: [facility];  Service: Endoscopy;;  proximal transverse colon (CB x1), descending colon (CBx3)   POSTERIOR CERVICAL FUSION/FORAMINOTOMY  08/18/2011   Procedure: POSTERIOR CERVICAL FUSION/FORAMINOTOMY LEVEL 3;  Surgeon: Cristi Loron, MD;  Location: [facility];  Service: Neurosurgery;  Laterality: N/A;  Cervical three-four, four-five, five-six posterior cervical  fusion with instrumentation; Right cervical three-four, four-five laminotomy   REVISION TOTAL HIP ARTHROPLASTY Left    SHOULDER ARTHROSCOPY     rt bith defect , spur, rotator cuff   SPINAL CORD STIMULATOR IMPLANT     rt hip   SPINAL CORD STIMULATOR INSERTION N/A 11/03/2016    Procedure: LUMBAR SPINAL CORD STIMULATOR REVISION;  Surgeon: Tressie Stalker, MD;  Location: [facility] OR;  Service: Neurosurgery;  Laterality: N/A;   THYROID LOBECTOMY     rt   Social History:  reports that he quit smoking about 42 years ago. His smoking use included cigarettes. He started smoking about 52 years ago. He has a 40 pack-year smoking history. He has never used smokeless tobacco. He reports that he does not currently use alcohol. He reports that he does not use drugs.  Allergies  Allergen Reactions   Demerol Other (See Comments)    "Makes me crazy."    Family History  Problem Relation Age of Onset   Heart disease Father    Aneurysm Mother    Aneurysm Sister    Heart disease Paternal Grandmother    Heart disease Paternal Grandfather    Heart disease Maternal Grandfather    Heart  disease Brother        heart transplant   Aneurysm Other        pt states several members have aortic aneurysm   Skin cancer Other        several members   Lymphoma Brother    Aneurysm Maternal Grandmother     Prior to Admission medications   Medication Sig Start Date End Date Taking? Authorizing Provider  Bioflavonoid Products (ESTER-C) 500-550 MG TABS Take 1 tablet by mouth daily.    [provider]  Calcium Carbonate-Vitamin D 600-10 MG-MCG TABS Take 1 tablet by mouth 2 (two) times daily.     [provider]  dexlansoprazole (DEXILANT) 60 MG capsule Take 60 mg by mouth daily.     [provider]  diazepam (VALIUM) 10 MG tablet Take 10 mg by mouth 4 (four) times daily as needed (for back pain).    [provider]  diltiazem (CARDIZEM) 30 MG tablet Take 30 mg by mouth 4 (four) times daily.    [provider]  fexofenadine (ALLEGRA) 180 MG tablet Take 180 mg by mouth every evening.    [provider]  fish oil-omega-3 fatty acids 1000 MG capsule Take 1 g by mouth 2 (two) times daily.    [provider]  HYDROcodone-acetaminophen  (NORCO/VICODIN) 5-325 MG tablet Take 2 tablets by mouth every 4 (four) hours as needed. 07/15/22   Rondel Baton, MD  hydrocortisone (CORTEF) 10 MG tablet Take 20mg  in am and 10 mg PM. 02/28/22   Vassie Loll, MD  lisinopril (ZESTRIL) 10 MG tablet Take 10 mg by mouth daily.    [provider]  Metamucil Fiber CHEW Chew 1 each by mouth daily.    [provider]  metoCLOPramide (REGLAN) 10 MG tablet Take 1 tablet by mouth in the morning and at bedtime. 08/03/20   [provider]  metoprolol succinate (TOPROL-XL) 25 MG 24 hr tablet Take 0.5 tablets (12.5 mg total) by mouth daily. 03/01/22   Vassie Loll, MD  montelukast (SINGULAIR) 10 MG tablet Take 10 mg by mouth daily.     [provider]  Multiple Vitamin (MULITIVITAMIN WITH MINERALS) TABS Take 1 tablet by mouth daily.    [provider]  ondansetron (ZOFRAN) 8 MG tablet Take 1 tablet (8 mg total) by mouth every 8 (eight) hours as needed for vomiting or nausea. 11/18/21   Dolores Frame, MD  oxyCODONE-acetaminophen (PERCOCET) 10-325 MG tablet Take 1 tablet by mouth every 6 (six) hours as needed for pain.    [provider]  Probiotic Product (PROBIOTIC PO) Take 1 tablet by mouth daily. culterelle    [provider]  ranolazine (RANEXA) 500 MG 12 hr tablet Take 500 mg by mouth 2 (two) times daily. 04/23/20   [provider]  rivaroxaban (XARELTO) 20 MG TABS tablet Take 1 tablet (20 mg total) by mouth daily with supper. 05/29/21   Sherryll Burger, Pratik D, DO  rosuvastatin (CRESTOR) 10 MG tablet Take 10 mg by mouth every evening.    [provider]    Physical Exam: Vitals:   01/08/23 0854 01/08/23 0900 01/08/23 0930 01/08/23 1218  BP: (!) 140/98  (!) 140/92 (!) 148/78  Pulse: 80  63 (!) 57  Resp: 16   18  Temp: 98 F (36.7 C)     TempSrc: Oral     SpO2: 95%  95% 94%  Weight:  109.3 kg    Height:  6' (1.829 m)  Constitutional: Elderly male currently in  no acute distress Eyes: PERRL, lids and conjunctivae normal ENMT: Mucous membranes are moist. Posterior pharynx clear of any exudate or lesions.  Neck: normal, supple,   Respiratory: clear to auscultation bilaterally, no wheezing, no crackles. Normal respiratory effort. No accessory muscle use.  Cardiovascular: Regular rate and rhythm, no murmurs / rubs / gallops. No extremity edema.  Abdomen: no tenderness, no masses palpated.  Bowel sounds positive.  Musculoskeletal: no clubbing / cyanosis. No joint deformity upper and lower extremities. Good ROM, no contractures. Normal muscle tone.  Skin: Some bruising noted over the upper forearms. Neurologic: CN 2-12 grossly intact.  Strength 5/5 in all 4.  Psychiatric: Normal judgment and insight. Alert and oriented x 3. Normal mood.   Data Reviewed:   Assessment and Plan:  Hematuria Possible cystitis Acute.  Patient presents with complaints of acute onset of hematuria that is painless this morning. Urinalysis initially positive for large hemoglobin, positive nitrites, rare bacteria, greater than 50 RBCs.  Hemoglobin 13.8 with stable vital signs..  Patient had been advised to hold Bruskinsa as bleeding is a common side effect.  Urology recommended no need for any further intervention. -Admit to a telemetry bed -Monitor intake and output -Check postvoid residuals -Check urine culture -Recheck H&H this evening -Hold Bruskinsa -Empiric antibiotics with Rocephin IV  Paroxysmal atrial fibrillation on chronic anticoagulation History of pulmonary embolism Patient appears to be in sinus rhythm at this time.  Patient has been on Xarelto without any issues of bleeding since 01/2022 after initially being found to be in atrial fibrillation.  Patient also has a history of pulmonary embolism, but CT angiogram of the chest abdomen pelvis with contrast did not note any signs of a pulmonary embolism from/02/2023 on care everywhere records. -Continue  Cardizem -Initial orders were to hold Xarelto, but resumed after found to have clearing urine after discussions with family  Splenic marginal zone lymphoma Chronic.  Patient with known splenic marginal zone B-cell lymphoma treated in an outpatient setting at [facility].  Recent started on Bruskinsa.  Notified patient's oncologist at [facility] who recommended holding the Bruskinsa. -Continue outpatient follow-up with hematology oncology  Pancytopenia Chronic.  WBC 3.7 and platelet count 46. -Continue to monitor  Essential hypertension Blood pressures are maintained. -Continue home blood pressure regimen  Chronic kidney disease stage IIIa Creatinine 1.38 with BUN 24.  Baseline creatinine appears to be around 1.4. -Continue to monitor  Chronic back pain -Continue Diazepam and cyclobenzaprine  Hyperlipidemia -Continue Crestor  AAA Patient has known ascending thoracic aortic last measured 4.6 cm   Barrett's esophagus GERD -Continue pharmacy substitution of Protonix  DVT prophylaxis: Xarelto Advance Care Planning:   Code Status: Full Code    Consults: Neurology  Family Communication: Daughter updated at bedside  Severity of Illness: The appropriate patient status for this patient is OBSERVATION. Observation status is judged to be reasonable and necessary in order to provide the required intensity of service to ensure the patient's safety. The patient's presenting symptoms, physical exam findings, and initial radiographic and laboratory data in the context of their medical condition is felt to place them at decreased risk for further clinical deterioration. Furthermore, it is anticipated that the patient will be medically stable for discharge from the hospital within 2 midnights of admission.   Author: Clydie Braun, MD 01/08/2023 1:24 PM  For on call review [email or web address].

## 2023-01-08 NOTE — ED Notes (Signed)
ED TO INPATIENT HANDOFF REPORT  ED Nurse Name and Phone #: [phone]  S Name/Age/Gender [name] 72 y.o. male Room/Bed: 045C/045C  Code Status   Code Status: Prior  Home/SNF/Other Home Patient oriented to: self, place, time, and situation Is this baseline? Yes   Triage Complete: Triage complete  Chief Complaint Hematuria [R31.9]  Triage Note Pt. 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 I also take Xeralato blood thinner . I called [facility] that's treating my cancer and said to come here.    Allergies Allergies  Allergen Reactions   Demerol Other (See Comments)    "Makes me crazy."    Level of Care/Admitting Diagnosis ED Disposition     ED Disposition  Admit   Condition  --   Comment  Hospital Area: [facility] [[id number]]  Level of Care: Telemetry Medical [104]  May place patient in observation at [facility] or [facility] if equivalent level of care is available:: No  Covid Evaluation: Asymptomatic - no recent exposure (last 10 days) testing not required  Diagnosis: Hematuria [409811]  Admitting Physician: Clydie Braun [[id number]]  Attending Physician: Clydie Braun [[id number]]          B Medical/Surgery History Past Medical History:  Diagnosis Date   Anxiety    patient denies   Aortic aneurysm (HCC)    ascending   Arthritis    Barrett esophagus    Blood transfusion    Cancer (HCC) 2007   melanoma stage 3 stomach bx   Cancer (HCC)    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   Coronary artery disease    mild by 09/08/13 cath [facility]   GERD (gastroesophageal reflux disease)    barretts esophagus   H/O hiatal hernia    Headache(784.0)    Hypercholesteremia    Hypertension    PONV (postoperative nausea and vomiting)    trouble breathing after last surgery (at [facility])   Shingles    10   Sleep apnea    not  tested   Tumor liver    no problems   Past Surgical History:  Procedure Laterality Date   ABDOMINAL SURGERY     x3   back surgeries     x 9   BACK SURGERY     x8   BIOPSY N/A 03/28/2013   Procedure: Distal Esophageal Biopsy;  Surgeon: Malissa Hippo, MD;  Location: [facility];  Service: Endoscopy;  Laterality: N/A;   BIOPSY  05/30/2016   Procedure: BIOPSY;  Surgeon: Malissa Hippo, MD;  [facility];  Service: Endoscopy;;   BIOPSY  02/19/2018   Procedure: BIOPSY;  Surgeon: Malissa Hippo, MD;  [facility];  Service: Endoscopy;;  Barret's esophagus   BIOPSY  08/29/2020   Procedure: BIOPSY;  Surgeon: Malissa Hippo, MD;  [facility];  Service: Endoscopy;;   BIOPSY  12/31/2021   Procedure: BIOPSY;  Surgeon: Dolores Frame, MD;  [facility];  Service: Gastroenterology;;   CARDIAC CATHETERIZATION  2020   CHEST SURGERY     lung incisional hernia for hernia   COLONOSCOPY WITH PROPOFOL N/A 02/19/2018   Procedure: COLONOSCOPY WITH PROPOFOL;  Surgeon: Malissa Hippo, MD;  [facility];  Service: Endoscopy;  Laterality: N/A;  1:00   CORONARY  ANGIOPLASTY WITH STENT PLACEMENT  2020   2 blockages: 1 stent   ESOPHAGOGASTRODUODENOSCOPY (EGD) WITH PROPOFOL N/A 03/28/2013   Procedure: ESOPHAGOGASTRODUODENOSCOPY (EGD) WITH PROPOFOL;  Surgeon: Malissa Hippo, MD;  Location: [facility];  Service: Endoscopy;  Laterality: N/A;  GE junction @ 38, proximal  margin @37    ESOPHAGOGASTRODUODENOSCOPY (EGD) WITH PROPOFOL N/A 05/30/2016   Procedure: ESOPHAGOGASTRODUODENOSCOPY (EGD) WITH PROPOFOL;  Surgeon: Malissa Hippo, MD;  [facility];  Service: Endoscopy;  Laterality: N/A;  955   ESOPHAGOGASTRODUODENOSCOPY (EGD) WITH PROPOFOL N/A 02/19/2018   Procedure: ESOPHAGOGASTRODUODENOSCOPY (EGD) WITH PROPOFOL;  Surgeon: Malissa Hippo, MD;  [facility];  Service: Endoscopy;  Laterality: N/A;   ESOPHAGOGASTRODUODENOSCOPY (EGD)  WITH PROPOFOL N/A 08/29/2020   Procedure: ESOPHAGOGASTRODUODENOSCOPY (EGD) WITH PROPOFOL;  Surgeon: Malissa Hippo, MD;  [facility];  Service: Endoscopy;  Laterality: N/A;  AM   ESOPHAGOGASTRODUODENOSCOPY (EGD) WITH PROPOFOL N/A 12/31/2021   Procedure: ESOPHAGOGASTRODUODENOSCOPY (EGD) WITH PROPOFOL;  Surgeon: Dolores Frame, MD;  [facility];  Service: Gastroenterology;  Laterality: N/A;  100 ASA 2   HEMORRHOID SURGERY     x2   HERNIA REPAIR     HIATAL HERNIA REPAIR     JOINT REPLACEMENT     lft knee   KNEE ARTHROPLASTY     lft   KNEE ARTHROSCOPY Right    LEG SURGERY     x4 crushed    LITHOTRIPSY     MELANOMA EXCISION     lft abdomen   NECK SURGERY  2012   POLYPECTOMY  02/19/2018   Procedure: POLYPECTOMY;  Surgeon: Malissa Hippo, MD;  [facility];  Service: Endoscopy;;  proximal transverse colon (CB x1), descending colon (CBx3)   POSTERIOR CERVICAL FUSION/FORAMINOTOMY  08/18/2011   Procedure: POSTERIOR CERVICAL FUSION/FORAMINOTOMY LEVEL 3;  Surgeon: Cristi Loron, MD;  Location: [facility];  Service: Neurosurgery;  Laterality: N/A;  Cervical three-four, four-five, five-six posterior cervical  fusion with instrumentation; Right cervical three-four, four-five laminotomy   REVISION TOTAL HIP ARTHROPLASTY Left    SHOULDER ARTHROSCOPY     rt bith defect , spur, rotator cuff   SPINAL CORD STIMULATOR IMPLANT     rt hip   SPINAL CORD STIMULATOR INSERTION N/A 11/03/2016   Procedure: LUMBAR SPINAL CORD STIMULATOR REVISION;  Surgeon: Tressie Stalker, MD;  Location: [facility] OR;  Service: Neurosurgery;  Laterality: N/A;   THYROID LOBECTOMY     rt     A IV Location/Drains/Wounds Patient Lines/Drains/Airways Status     Active Line/Drains/Airways     Name Placement date Placement time Site Days   Peripheral IV 09/19/22 22 G 1" Right Hand 09/19/22  0146  Hand  111   Peripheral IV 09/19/22 20 G Anterior;Distal;Right;Upper Arm 09/19/22  0455  Arm   111   Peripheral IV 01/08/23 20 G Anterior;Distal;Left;Upper Arm 01/08/23  0931  Arm  less than 1            Intake/Output Last 24 hours No intake or output data in the 24 hours ending 01/08/23 1307  Labs/Imaging Results for orders placed or performed during the hospital encounter of 01/08/23 (from the past 48 hour(s))  Urinalysis, Routine w reflex microscopic -Urine, Random     Status: Abnormal   Collection Time: 01/08/23  9:00 AM  Result Value Ref Range   Color, Urine BROWN (A) YELLOW    Comment: BIOCHEMICALS MAY BE AFFECTED BY COLOR   APPearance CLOUDY (A) CLEAR  Specific Gravity, Urine >1.030 (H) 1.005 - 1.030   pH 6.0 5.0 - 8.0   Glucose, UA NEGATIVE NEGATIVE mg/dL   Hgb urine dipstick LARGE (A) NEGATIVE   Bilirubin Urine SMALL (A) NEGATIVE   Ketones, ur NEGATIVE NEGATIVE mg/dL   Protein, ur 30 (A) NEGATIVE mg/dL   Nitrite POSITIVE (A) NEGATIVE   Leukocytes,Ua NEGATIVE NEGATIVE    Comment: Performed at [facility], [address] [place], [place] [address]  Urinalysis, Microscopic (reflex)     Status: Abnormal   Collection Time: 01/08/23  9:00 AM  Result Value Ref Range   RBC / HPF >50 0 - 5 RBC/hpf   WBC, UA 0-5 0 - 5 WBC/hpf   Bacteria, UA RARE (A) NONE SEEN   Squamous Epithelial / HPF 0-5 0 - 5 /HPF    Comment: Performed at [facility], [address] [place], [place] [address]  CBC with Differential     Status: Abnormal   Collection Time: 01/08/23  9:31 AM  Result Value Ref Range   WBC 3.7 (L) 4.0 - 10.5 K/uL   RBC 4.61 4.22 - 5.81 MIL/uL   Hemoglobin 13.8 13.0 - 17.0 g/dL   HCT 01.0 93.2 - 35.5 %   MCV 91.1 80.0 - 100.0 fL   MCH 29.9 26.0 - 34.0 pg   MCHC 32.9 30.0 - 36.0 g/dL   RDW 73.2 20.2 - 54.2 %   Platelets 46 (L) 150 - 400 K/uL    Comment: Immature Platelet Fraction may be clinically indicated, consider ordering this additional test [id number] REPEATED TO VERIFY PLATELET COUNT CONFIRMED BY SMEAR    nRBC 0.0 0.0 - 0.2 %    Neutrophils Relative % 64 %   Neutro Abs 2.4 1.7 - 7.7 K/uL   Lymphocytes Relative 26 %   Lymphs Abs 1.0 0.7 - 4.0 K/uL   Monocytes Relative 9 %   Monocytes Absolute 0.3 0.1 - 1.0 K/uL   Eosinophils Relative 0 %   Eosinophils Absolute 0.0 0.0 - 0.5 K/uL   Basophils Relative 0 %   Basophils Absolute 0.0 0.0 - 0.1 K/uL   Immature Granulocytes 1 %   Abs Immature Granulocytes 0.02 0.00 - 0.07 K/uL    Comment: Performed at [facility], [address], [place], [place] [address]  Comprehensive metabolic panel     Status: Abnormal   Collection Time: 01/08/23  9:31 AM  Result Value Ref Range   Sodium 141 135 - 145 mmol/L   Potassium 4.1 3.5 - 5.1 mmol/L   Chloride 108 98 - 111 mmol/L   CO2 24 22 - 32 mmol/L   Glucose, Bld 89 70 - 99 mg/dL    Comment: Glucose reference range applies only to samples taken after fasting for at least 8 hours.   BUN 24 (H) 8 - 23 mg/dL   Creatinine, Ser 7.61 (H) 0.61 - 1.24 mg/dL   Calcium 9.1 8.9 - 60.7 mg/dL   Total Protein 7.1 6.5 - 8.1 g/dL   Albumin 4.4 3.5 - 5.0 g/dL   AST 23 15 - 41 U/L   ALT 19 0 - 44 U/L   Alkaline Phosphatase 47 38 - 126 U/L   Total Bilirubin 1.3 (H) 0.3 - 1.2 mg/dL   GFR, Estimated 55 (L) >60 mL/min    Comment: (NOTE) Calculated using the CKD-EPI Creatinine Equation (2021)    Anion gap 9 5 - 15    Comment: Performed at [facility],  [address] [place], [place] [address]  Protime-INR     Status: Abnormal   Collection Time: 01/08/23  9:31 AM  Result Value Ref Range   Prothrombin Time 24.7 (H) 11.4 - 15.2 seconds   INR 2.2 (H) 0.8 - 1.2    Comment: (NOTE) INR goal varies based on device and disease states. Performed at [facility], [address], [place], [place] [address]    CT Renal Stone Study  Result Date: 01/08/2023 CLINICAL DATA:  Hematuria this morning.  History of kidney stones. EXAM: CT ABDOMEN AND PELVIS WITHOUT CONTRAST TECHNIQUE: Multidetector CT imaging of the abdomen and pelvis was  performed following the standard protocol without IV contrast. RADIATION DOSE REDUCTION: This exam was performed according to the departmental dose-optimization program which includes automated exposure control, adjustment of the mA and/or kV according to patient size and/or use of iterative reconstruction technique. COMPARISON:  CT chest, abdomen, and pelvis dated September 19, 2022. FINDINGS: Lower chest: No acute abnormality. Hepatobiliary: No focal liver abnormality is seen. No gallstones, gallbladder wall thickening, or biliary dilatation. Pancreas: Unremarkable. No pancreatic ductal dilatation or surrounding inflammatory changes. Spleen: Unchanged splenomegaly.  No focal lesion. Adrenals/Urinary Tract: The adrenal glands are unremarkable. No renal calculi or hydronephrosis. Unchanged 4.1 cm left renal simple cyst. No follow-up imaging is recommended. The bladder is decompressed. Stomach/Bowel: Similar postsurgical changes at the gastroesophageal junction with unchanged small to moderate hiatal hernia. The stomach is otherwise within normal limits. No bowel wall thickening, distention, or surrounding inflammatory changes. Left-sided colonic diverticulosis. Diminutive or absent appendix. Vascular/Lymphatic: Aortic atherosclerosis. No enlarged abdominal or pelvic lymph nodes. Reproductive: Prostate is unremarkable. Other: Unchanged small fat containing right greater than left inguinal hernias. Unchanged tiny fat containing umbilical hernia. Prior ventral hernia repair. No free fluid or pneumoperitoneum. Musculoskeletal: No acute or significant osseous findings. Prior lumbar fusion and left total hip arthroplasty. IMPRESSION: 1. No acute intra-abdominal process. No urolithiasis. 2. Unchanged splenomegaly. 3. Unchanged small to moderate hiatal hernia. 4.  Aortic Atherosclerosis (ICD10-I70.0). Electronically Signed   By: Obie Dredge M.D.   On: 01/08/2023 10:27    Pending Labs Unresulted Labs (From admission,  onward)     Start     Ordered   01/08/23 1225  Urinalysis, w/ Reflex to Culture (Infection Suspected) -Urine, Clean Catch  (Urine Labs)  Once,   URGENT       Question:  Specimen Source  Answer:  Urine, Clean Catch   01/08/23 1224            Vitals/Pain Today's Vitals   01/08/23 0854 01/08/23 0900 01/08/23 0930 01/08/23 1218  BP: (!) 140/98  (!) 140/92 (!) 148/78  Pulse: 80  63 (!) 57  Resp: 16   18  Temp: 98 F (36.7 C)     TempSrc: Oral     SpO2: 95%  95% 94%  Weight:  109.3 kg    Height:  6' (1.829 m)    PainSc:  0-No pain      Isolation Precautions No active isolations  Medications Medications - No data to display  Mobility walks     Focused Assessments GU  R Recommendations: See Admitting Provider Note  Report given to:   Additional Notes:

## 2023-01-08 NOTE — Consult Note (Signed)
Urology Consult Note   Requesting Attending Physician:  Margarita Grizzle, MD Service Providing Consult: Urology  Consulting Attending: Dr. Lafonda Mosses   Reason for Consult: Hematuria  HPI: [name] is seen in consultation for reasons noted above at the request of Margarita Grizzle, MD  ------------------  Assessment:  72 y.o. male presenting to [facility] emergency department with frank blood in urine.  PMH significant for marginal zone lymphoma.  He is presently cared for by Dr. Nolen Mu of [facility].  He has received 4 cycles of rituximab and recently began Brukinsa for low platelet.  After taking a few doses of this he reported severe new onset hematuria, a rare but known side effect of this medication.  Additionally he is on full dose Xarelto.  He is known to our practice and has been seen previously for microhematuria, though never frank bleeding, as well as BPH.  He was cared for by Dr. Benancio Deeds who has recently transitioned his practice to the [place], where the patient previously got most of his care.  [facility] was consulted to speak to the hematuria.  On my arrival patient was alert, oriented, and in no distress.  He was accompanied by his daughter.  He was an excellent historian, reporting around 30 distinct surgeries, largely related to orthospine.  There was no catheter in place and no urine sample have been preserved so I have been unable to assess what level of hematuria he has at this time.    Recommendations: # Hematuria Recommend holding Brukinsa and Xarelto for the time being if possible.  Patient daughter is reaching out to his oncology navigator for further guidance.  Recommend string of bottles so that urology can chart the progression of his hematuria. Urinalysis not suggestive of hemorrhagic cystitis, though patient is notably dehydrated and volume resuscitation would de-concentrate his urine and increase probability that his hematuria will resolve without  intervention. Trend labs If patient begins to develop clot obstruction of urine, would recommend placement of three-way hematuria catheter, hand irrigation, and continuous bladder irrigation. Will reevaluate in the morning  Case and plan discussed with Dr. 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   Coronary artery disease    mild by 09/08/13 cath [facility]   GERD (gastroesophageal reflux disease)    barretts esophagus   H/O hiatal hernia    Headache(784.0)    Hypercholesteremia    Hypertension    PONV (postoperative nausea and vomiting)    trouble breathing after last surgery (at [facility])   Shingles    10   Sleep apnea    not tested   Tumor liver    no problems    Past Surgical History:  Past Surgical History:  Procedure Laterality Date   ABDOMINAL SURGERY     x3   back surgeries     x 9   BACK SURGERY     x8   BIOPSY N/A 03/28/2013   Procedure: Distal Esophageal Biopsy;  Surgeon: Malissa Hippo, MD;  Location: [facility];  Service: Endoscopy;  Laterality: N/A;   BIOPSY  05/30/2016   Procedure: BIOPSY;  Surgeon: Malissa Hippo,  MD;  [facility];  Service: Endoscopy;;   BIOPSY  02/19/2018   Procedure: BIOPSY;  Surgeon: Malissa Hippo, MD;  [facility];  Service: Endoscopy;;  Barret's esophagus   BIOPSY  08/29/2020   Procedure: BIOPSY;  Surgeon: Malissa Hippo, MD;  [facility];  Service: Endoscopy;;   BIOPSY  12/31/2021   Procedure: BIOPSY;  Surgeon: Dolores Frame, MD;  [facility]: [facility];  Service: Gastroenterology;;   CARDIAC CATHETERIZATION  2020   CHEST SURGERY     lung incisional hernia for hernia   COLONOSCOPY WITH PROPOFOL N/A 02/19/2018   Procedure: COLONOSCOPY  WITH PROPOFOL;  Surgeon: Malissa Hippo, MD;  [facility];  Service: Endoscopy;  Laterality: N/A;  1:00   CORONARY ANGIOPLASTY WITH STENT PLACEMENT  2020   2 blockages: 1 stent   ESOPHAGOGASTRODUODENOSCOPY (EGD) WITH PROPOFOL N/A 03/28/2013   Procedure: ESOPHAGOGASTRODUODENOSCOPY (EGD) WITH PROPOFOL;  Surgeon: Malissa Hippo, MD;  Location: [facility];  Service: Endoscopy;  Laterality: N/A;  GE junction @ 38, proximal  margin @37    ESOPHAGOGASTRODUODENOSCOPY (EGD) WITH PROPOFOL N/A 05/30/2016   Procedure: ESOPHAGOGASTRODUODENOSCOPY (EGD) WITH PROPOFOL;  Surgeon: Malissa Hippo, MD;  [facility];  Service: Endoscopy;  Laterality: N/A;  955   ESOPHAGOGASTRODUODENOSCOPY (EGD) WITH PROPOFOL N/A 02/19/2018   Procedure: ESOPHAGOGASTRODUODENOSCOPY (EGD) WITH PROPOFOL;  Surgeon: Malissa Hippo, MD;  [facility];  Service: Endoscopy;  Laterality: N/A;   ESOPHAGOGASTRODUODENOSCOPY (EGD) WITH PROPOFOL N/A 08/29/2020   Procedure: ESOPHAGOGASTRODUODENOSCOPY (EGD) WITH PROPOFOL;  Surgeon: Malissa Hippo, MD;  [facility];  Service: Endoscopy;  Laterality: N/A;  AM   ESOPHAGOGASTRODUODENOSCOPY (EGD) WITH PROPOFOL N/A 12/31/2021   Procedure: ESOPHAGOGASTRODUODENOSCOPY (EGD) WITH PROPOFOL;  Surgeon: Dolores Frame, MD;  [facility];  Service: Gastroenterology;  Laterality: N/A;  100 ASA 2   HEMORRHOID SURGERY     x2   HERNIA REPAIR     HIATAL HERNIA REPAIR     JOINT REPLACEMENT     lft knee   KNEE ARTHROPLASTY     lft   KNEE ARTHROSCOPY Right    LEG SURGERY     x4 crushed    LITHOTRIPSY     MELANOMA EXCISION     lft abdomen   NECK SURGERY  2012   POLYPECTOMY  02/19/2018   Procedure: POLYPECTOMY;  Surgeon: Malissa Hippo, MD;  [facility];  Service: Endoscopy;;  proximal transverse colon (CB x1), descending colon (CBx3)   POSTERIOR CERVICAL FUSION/FORAMINOTOMY  08/18/2011   Procedure: POSTERIOR CERVICAL  FUSION/FORAMINOTOMY LEVEL 3;  Surgeon: Cristi Loron, MD;  Location: [facility];  Service: Neurosurgery;  Laterality: N/A;  Cervical three-four, four-five, five-six posterior cervical  fusion with instrumentation; Right cervical three-four, four-five laminotomy   REVISION TOTAL HIP ARTHROPLASTY Left    SHOULDER ARTHROSCOPY     rt bith defect , spur, rotator cuff   SPINAL CORD STIMULATOR IMPLANT     rt hip   SPINAL CORD STIMULATOR INSERTION N/A 11/03/2016   Procedure: LUMBAR SPINAL CORD STIMULATOR REVISION;  Surgeon: Tressie Stalker, MD;  Location: [facility];  Service: Neurosurgery;  Laterality: N/A;   THYROID LOBECTOMY     rt    Medication: No current facility-administered medications for this encounter.   Current Outpatient Medications  Medication Sig Dispense Refill   Bioflavonoid Products (ESTER-C) 500-550 MG TABS Take 1 tablet by mouth daily.     Calcium Carbonate-Vitamin  D 600-10 MG-MCG TABS Take 1 tablet by mouth 2 (two) times daily.      dexlansoprazole (DEXILANT) 60 MG capsule Take 60 mg by mouth daily.      diazepam (VALIUM) 10 MG tablet Take 10 mg by mouth 4 (four) times daily as needed (for back pain).     diltiazem (CARDIZEM) 30 MG tablet Take 30 mg by mouth 4 (four) times daily.     fexofenadine (ALLEGRA) 180 MG tablet Take 180 mg by mouth every evening.     fish oil-omega-3 fatty acids 1000 MG capsule Take 1 g by mouth 2 (two) times daily.     HYDROcodone-acetaminophen (NORCO/VICODIN) 5-325 MG tablet Take 2 tablets by mouth every 4 (four) hours as needed. 15 tablet 0   hydrocortisone (CORTEF) 10 MG tablet Take 20mg  in am and 10 mg PM. 90 tablet 2   lisinopril (ZESTRIL) 10 MG tablet Take 10 mg by mouth daily.     Metamucil Fiber CHEW Chew 1 each by mouth daily.     metoCLOPramide (REGLAN) 10 MG tablet Take 1 tablet by mouth in the morning and at bedtime.     metoprolol succinate (TOPROL-XL) 25 MG 24 hr tablet Take 0.5 tablets (12.5 mg total) by mouth daily. 60 tablet 1    montelukast (SINGULAIR) 10 MG tablet Take 10 mg by mouth daily.      Multiple Vitamin (MULITIVITAMIN WITH MINERALS) TABS Take 1 tablet by mouth daily.     ondansetron (ZOFRAN) 8 MG tablet Take 1 tablet (8 mg total) by mouth every 8 (eight) hours as needed for vomiting or nausea. 30 tablet 2   oxyCODONE-acetaminophen (PERCOCET) 10-325 MG tablet Take 1 tablet by mouth every 6 (six) hours as needed for pain.     Probiotic Product (PROBIOTIC PO) Take 1 tablet by mouth daily. culterelle     ranolazine (RANEXA) 500 MG 12 hr tablet Take 500 mg by mouth 2 (two) times daily.     rivaroxaban (XARELTO) 20 MG TABS tablet Take 1 tablet (20 mg total) by mouth daily with supper. 30 tablet 2   rosuvastatin (CRESTOR) 10 MG tablet Take 10 mg by mouth every evening.      Allergies: Allergies  Allergen Reactions   Demerol Other (See Comments)    "Makes me crazy."    Social History: Social History   Tobacco Use   Smoking status: Former    Current packs/day: 0.00    Average packs/day: 4.0 packs/day for 10.0 years (40.0 ttl pk-yrs)    Types: Cigarettes    Start date: 08/13/1970    Quit date: 08/12/1980    Years since quitting: 42.4   Smokeless tobacco: Never   Tobacco comments:    occ wine  Vaping Use   Vaping status: Never Used  Substance Use Topics   Alcohol use: Not Currently   Drug use: No    Family History Family History  Problem Relation Age of Onset   Heart disease Father    Aneurysm Mother    Aneurysm Sister    Heart disease Paternal Grandmother    Heart disease Paternal Grandfather    Heart disease Maternal Grandfather    Heart disease Brother        heart transplant   Aneurysm Other        pt states several members have aortic aneurysm   Skin cancer Other        several members   Lymphoma Brother    Aneurysm Maternal Grandmother  Review of Systems  Genitourinary:  Positive for hematuria. Negative for dysuria, flank pain, frequency and urgency.     Objective   Vital  signs in last 24 hours: BP (!) 140/92   Pulse 63   Temp 98 F (36.7 C) (Oral)   Resp 16   Ht 6' (1.829 m)   Wt 109.3 kg   SpO2 95%   BMI 32.69 kg/m   Physical Exam General: NAD, A&O, resting, appropriate HEENT: [place]/AT Pulmonary: Normal work of breathing Cardiovascular: RRR, no cyanosis   Most Recent Labs: Lab Results  Component Value Date   WBC 3.7 (L) 01/08/2023   HGB 13.8 01/08/2023   HCT 42.0 01/08/2023   PLT 46 (L) 01/08/2023    Lab Results  Component Value Date   NA 141 01/08/2023   K 4.1 01/08/2023   CL 108 01/08/2023   CO2 24 01/08/2023   BUN 24 (H) 01/08/2023   CREATININE 1.38 (H) 01/08/2023   CALCIUM 9.1 01/08/2023   MG 2.0 02/25/2022   PHOS 3.0 02/13/2022    Lab Results  Component Value Date   INR 2.2 (H) 01/08/2023   APTT 33 09/02/2007     Urine Culture: @[id number] (laburin,org,[id number],[id number])@   IMAGING: CT Renal Stone Study  Result Date: 01/08/2023 CLINICAL DATA:  Hematuria this morning.  History of kidney stones. EXAM: CT ABDOMEN AND PELVIS WITHOUT CONTRAST TECHNIQUE: Multidetector CT imaging of the abdomen and pelvis was performed following the standard protocol without IV contrast. RADIATION DOSE REDUCTION: This exam was performed according to the departmental dose-optimization program which includes automated exposure control, adjustment of the mA and/or kV according to patient size and/or use of iterative reconstruction technique. COMPARISON:  CT chest, abdomen, and pelvis dated September 19, 2022. FINDINGS: Lower chest: No acute abnormality. Hepatobiliary: No focal liver abnormality is seen. No gallstones, gallbladder wall thickening, or biliary dilatation. Pancreas: Unremarkable. No pancreatic ductal dilatation or surrounding inflammatory changes. Spleen: Unchanged splenomegaly.  No focal lesion. Adrenals/Urinary Tract: The adrenal glands are unremarkable. No renal calculi or hydronephrosis. Unchanged 4.1 cm left renal simple cyst. No follow-up imaging  is recommended. The bladder is decompressed. Stomach/Bowel: Similar postsurgical changes at the gastroesophageal junction with unchanged small to moderate hiatal hernia. The stomach is otherwise within normal limits. No bowel wall thickening, distention, or surrounding inflammatory changes. Left-sided colonic diverticulosis. Diminutive or absent appendix. Vascular/Lymphatic: Aortic atherosclerosis. No enlarged abdominal or pelvic lymph nodes. Reproductive: Prostate is unremarkable. Other: Unchanged small fat containing right greater than left inguinal hernias. Unchanged tiny fat containing umbilical hernia. Prior ventral hernia repair. No free fluid or pneumoperitoneum. Musculoskeletal: No acute or significant osseous findings. Prior lumbar fusion and left total hip arthroplasty. IMPRESSION: 1. No acute intra-abdominal process. No urolithiasis. 2. Unchanged splenomegaly. 3. Unchanged small to moderate hiatal hernia. 4.  Aortic Atherosclerosis (ICD10-I70.0). Electronically Signed   By: Obie Dredge M.D.   On: 01/08/2023 10:27    ------  Elmon Kirschner, NP Pager: [phone]   Please contact the urology consult pager with any further questions/concerns.

## 2023-01-09 DIAGNOSIS — R319 Hematuria, unspecified: Secondary | ICD-10-CM | POA: Diagnosis not present

## 2023-01-09 MED ORDER — CEFADROXIL 500 MG PO CAPS: 500.0000 mg | ORAL_CAPSULE | Freq: Two times a day (BID) | ORAL | 0 refills | Status: AC

## 2023-01-09 NOTE — Discharge Summary (Signed)
**Note De-Identified vi Obfusction** Physicin Dischrge Summry  [name] WUJ:[id number] DOB: Mrch 25, 1952 DOA: 01/08/2023  PCP: Elfredi Nevins, MD  Admit dte: 01/08/2023 Dischrge dte: 01/09/2023  Time spent: 40 minutes  Recommendtions for Outptient Follow-up:  Follow outptient CBC/CMP  Holding xrelto nd brukins t dischrge - defer to oncology resumption Treting for UTI empiriclly with positive nitrite Follow with urology outptient for further evlution of hemturi   Dischrge Dignoses:  Principl Problem:   Hemturi Active Problems:   Proxysml tril fibrilltion (HCC)   History of pulmonry embolus (PE)   Mrginl zone lymphom of spleen (HCC)   Pncytopeni (HCC)   Essentil hypertension, benign   Chronic kidney disese, stge 3 (HCC)   Bck pin without rdition   Hyperlipidemi   AAA (bdominl ortic neurysm) (HCC)   GERD (gstroesophgel reflux disese)   Dischrge Condition: stble  Diet recommendtion: hert helthy  Filed Weights   01/08/23 0900 01/08/23 1447  Weight: 109.3 kg 110.2 kg    History of present illness:    [name] is  72 y.o. mle with medicl history significnt of hypertension, hyperlipidemi, CAD, proxysml tril fibrilltion on nticogultion, pulmonry embolism , thrombocytopeni, splenic mrginl zone B-cell lymphom, melnom, hitl herni s/p Elsen fundopliction, GERD who presents with complints of blood in urine.  Symptoms strted this morning.  When he initilly urinted he stted tht it ws drk red blood.  However, since tht time his pee hs lightened in color lthough still reports being red.  Not hving ny pin or difficulty voiding.  Denies hving ny fever, lighthededness, chest pin, nuse, vomiting, bdominl pin, blood in stools, or dirrhe.  Ptient hd just recently been strted on Bruskins to tret his lymphom.  His dughter mkes note tht during his previous hospitliztion in in September of lst yer he hd to be  hospitlized bck-to-bck fter being found to hve severe sepsis.  During the hospitliztions he ws found to be in tril fibrilltion nd strted on Xrelto.   In the emergency deprtment ptient noted to be febrile with pulse 57-80, nd ll other vitl signs mintined.  Lbs significnt for WBC 3.7, pltelets 46, BUN 24, cretinine 1.38, nd INR 2.2.  Renl CT scn study did not show ny signs of kidney stones or cute intr-bdominl process.  Urology hd been consulted, but did not recommend ny surgicl intervention.  His hemturi hs resolved on hospitl dy 1.  Recommending dischrge with outptient follow up with urology nd oncology.     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  Ptient hs been on Xrelto without ny issues of bleeding since 01/2022 fter initilly being found to be in tril fibrilltion.  Ptient lso hs  history of pulmonry embolism, but CT ngiogrm of the chest bdomen pelvis with contrst did not note ny signs of  pulmonry embolism from/02/2023 on cre everywhere records. -Continue Crdizem -hold xrelto with thrombocytopeni less thn 50,000    Splenic mrginl zone lymphom Chronic.  Ptient with known splenic mrginl zone B-cell lymphom treted in n outptient setting t [facility].  Recent strted on Bruskins.  Notified ptient's oncologist t [facility] who recommended holding the Brukins. -Continue outptient follow-up with hemtology oncology   Pncytopeni Chronic.  WBC 3.7 and platelet count 46. Follow outpatient Will hold xarelto with thrombocytopenia   Essential hypertension Continue home BP meds    Chronic kidney disease stage IIIa Relatively stable at discharge   Chronic back pain -Continue Diazepam and cyclobenzaprine   Hyperlipidemia -Continue Crestor   AAA Patient has known ascending thoracic aortic last measured 4.6 cm    Barrett's esophagus GERD PPI     Procedures: none   Consultations: urology  Discharge Exam: Vitals:   01/09/23 0512 01/09/23 0848  BP: (!) 130/58 (!) 140/78  Pulse: (!) 51 60  Resp: 16 18  Temp: (!) 97.4 F (36.3 C) (!) 97.5 F (36.4 C)  SpO2: 92% 95%   No complaints   General: No acute distress. Cardiovascular: RRR Lungs: unlabored Abdomen: Soft, nontender, nondistended  Neurological: Alert and oriented 3. Moves all extremities 4 with equal strength. Cranial nerves II through XII grossly intact. Extremities: No clubbing or cyanosis. No edema.  Discharge Instructions   Discharge Instructions     Call MD for:  difficulty breathing, headache or visual disturbances   Complete by: As directed    Call MD for:  extreme fatigue   Complete by: As directed    Call MD for:  hives   Complete by: As directed    Call MD for:  persistant dizziness or light-headedness   Complete by: As directed    Call MD for:  persistant nausea and vomiting   Complete by: As directed    Call MD for:  redness, tenderness, or signs of infection (pain, swelling, redness, odor or green/yellow discharge around incision site)   Complete by: As directed    Call MD for:  severe uncontrolled pain   Complete by: As directed    Call MD for:  temperature >100.4   Complete by: As directed    Diet - low sodium heart healthy   Complete by: As directed    Discharge instructions   Complete by: As directed    You were seen for blood in your urine (hematuria).    We'll hold the xarelto on discharge because of your low platelets (thrombocytopenia).  You should also hold the brukinsa.  Ask your oncologist (Dr. Nolen Mu) when you can resume either of these  medicines.  You should follow up with the urologists for the blood in your urine.  They'll let you know how they want to work this up (if you need any additional imaging or outpatient procedures).  Return for new, recurrent, or worsening symptoms.  Please ask your PCP to request records from this hospitalization so they know what was done and what the next steps will be.   Increase activity slowly   Complete by: As directed       Allergies as of 01/09/2023       Reactions   Demerol Other (See Comments)   "Makes me crazy."        Medication List     STOP taking these medications    metoprolol succinate 25 MG 24 hr tablet Commonly known as: TOPROL-XL   rivaroxaban 20 MG Tabs tablet Commonly known as: XARELTO   zanubrutinib 80 MG capsule Commonly known as: BRUKINSA       TAKE these medications    Calcium Carbonate-Vitamin D 600-10 MG-MCG Tabs Take 1 tablet by mouth 2 (two) times daily.   Dexilant 60 MG capsule Generic drug: dexlansoprazole Take 60 mg by mouth daily.   diazepam 10 MG tablet Commonly known as: VALIUM Take 10  mg by mouth in the morning and at bedtime.   Ester-C 500-550 MG Tabs Take 1 tablet by mouth daily.   fexofenadine 180 MG tablet Commonly known as: ALLEGRA Take 180 mg by mouth every evening.   fish oil-omega-3 fatty acids 1000 MG capsule Take 1 g by mouth 2 (two) times daily.   hydrocortisone 10 MG tablet Commonly known as: CORTEF Take 20mg  in am and 10 mg PM. What changed:  how much to take how to take this when to take this additional instructions   lisinopril 10 MG tablet Commonly known as: ZESTRIL Take 10 mg by mouth daily.   metoCLOPramide 10 MG tablet Commonly known as: REGLAN Take 1 tablet by mouth in the morning and at bedtime.   montelukast 10 MG tablet Commonly known as: SINGULAIR Take 10 mg by mouth daily.   multivitamin with minerals Tabs tablet Take 1 tablet by mouth daily.   ondansetron 8 MG  tablet Commonly known as: ZOFRAN Take 1 tablet (8 mg total) by mouth every 8 (eight) hours as needed for vomiting or nausea.   PROBIOTIC PO Take 1 tablet by mouth daily.   ranolazine 500 MG 12 hr tablet Commonly known as: RANEXA Take 500 mg by mouth 2 (two) times daily.   rosuvastatin 10 MG tablet Commonly known as: CRESTOR Take 5 mg by mouth daily.       Allergies  Allergen Reactions   Demerol Other (See Comments)    "Makes me crazy."      The results of significant diagnostics from this hospitalization (including imaging, microbiology, ancillary and laboratory) are listed below for reference.    Significant Diagnostic Studies: CT Renal Stone Study  Result Date: 01/08/2023 CLINICAL DATA:  Hematuria this morning.  History of kidney stones. EXAM: CT ABDOMEN AND PELVIS WITHOUT CONTRAST TECHNIQUE: Multidetector CT imaging of the abdomen and pelvis was performed following the standard protocol without IV contrast. RADIATION DOSE REDUCTION: This exam was performed according to the departmental dose-optimization program which includes automated exposure control, adjustment of the mA and/or kV according to patient size and/or use of iterative reconstruction technique. COMPARISON:  CT chest, abdomen, and pelvis dated September 19, 2022. FINDINGS: Lower chest: No acute abnormality. Hepatobiliary: No focal liver abnormality is seen. No gallstones, gallbladder wall thickening, or biliary dilatation. Pancreas: Unremarkable. No pancreatic ductal dilatation or surrounding inflammatory changes. Spleen: Unchanged splenomegaly.  No focal lesion. Adrenals/Urinary Tract: The adrenal glands are unremarkable. No renal calculi or hydronephrosis. Unchanged 4.1 cm left renal simple cyst. No follow-up imaging is recommended. The bladder is decompressed. Stomach/Bowel: Similar postsurgical changes at the gastroesophageal junction with unchanged small to moderate hiatal hernia. The stomach is otherwise within normal  limits. No bowel wall thickening, distention, or surrounding inflammatory changes. Left-sided colonic diverticulosis. Diminutive or absent appendix. Vascular/Lymphatic: Aortic atherosclerosis. No enlarged abdominal or pelvic lymph nodes. Reproductive: Prostate is unremarkable. Other: Unchanged small fat containing right greater than left inguinal hernias. Unchanged tiny fat containing umbilical hernia. Prior ventral hernia repair. No free fluid or pneumoperitoneum. Musculoskeletal: No acute or significant osseous findings. Prior lumbar fusion and left total hip arthroplasty. IMPRESSION: 1. No acute intra-abdominal process. No urolithiasis. 2. Unchanged splenomegaly. 3. Unchanged small to moderate hiatal hernia. 4.  Aortic Atherosclerosis (ICD10-I70.0). Electronically Signed   By: Obie Dredge M.D.   On: 01/08/2023 10:27    Microbiology: No results found for this or any previous visit (from the past 240 hour(s)).   Labs: Basic Metabolic Panel: Recent Labs  Lab 01/08/23  9562 01/08/23 2342  NA 141 137  K 4.1 3.5  CL 108 103  CO2 24 24  GLUCOSE 89 110*  BUN 24* 19  CREATININE 1.38* 1.51*  CALCIUM 9.1 8.4*   Liver Function Tests: Recent Labs  Lab 01/08/23 0931  AST 23  ALT 19  ALKPHOS 47  BILITOT 1.3*  PROT 7.1  ALBUMIN 4.4   No results for input(s): "LIPASE", "AMYLASE" in the last 168 hours. No results for input(s): "AMMONIA" in the last 168 hours. CBC: Recent Labs  Lab 01/08/23 0931 01/08/23 1941 01/08/23 2342  WBC 3.7*  --  2.8*  NEUTROABS 2.4  --   --   HGB 13.8 12.3* 12.1*  HCT 42.0 36.9* 36.1*  MCV 91.1  --  92.1  PLT 46*  --  42*   Cardiac Enzymes: No results for input(s): "CKTOTAL", "CKMB", "CKMBINDEX", "TROPONINI" in the last 168 hours. BNP: BNP (last 3 results) No results for input(s): "BNP" in the last 8760 hours.  ProBNP (last 3 results) No results for input(s): "PROBNP" in the last 8760 hours.  CBG: No results for input(s): "GLUCAP" in the last 168  hours.     Signed:  Lacretia Nicks MD.  [facility] 01/09/2023, 1:02 PM

## 2023-01-09 NOTE — Care Management Obs Status (Signed)
MEDICARE OBSERVATION STATUS NOTIFICATION   Patient Details  Name: [name] MRN: [id number] Date of Birth: April 06, 1951   Medicare Observation Status Notification Given:  Yes    Tom-Johnson, Hershal Coria, RN 01/09/2023, 1:35 PM

## 2023-01-09 NOTE — Progress Notes (Signed)
PIV removed. AVS reviewed with patient and his daughter. Both verbalized understanding of necessary medication regimen and follow up appointments

## 2023-01-09 NOTE — Progress Notes (Signed)
Subjective: No acute events overnight.  Patient resting in bed.  Accompanied by his daughter.  He reports that his hematuria has resolved.  Objective: Vital signs in last 24 hours: Temp:  [97.4 F (36.3 C)-98 F (36.7 C)] 97.5 F (36.4 C) (08/09 0848) Pulse Rate:  [51-68] 60 (08/09 0848) Resp:  [16-18] 18 (08/09 0848) BP: (120-152)/(58-97) 140/78 (08/09 0848) SpO2:  [92 %-97 %] 95 % (08/09 0848) Weight:  [110.2 kg] 110.2 kg (08/08 1447)  Assessment/Plan: # Hematuria Patient was able to speak with their oncology navigator who agreed in discontinuing the Brukinsa.  Patient has remained on Xarelto and has had a resolution of hematuria since last night.  No further urologic intervention indicated.  Somehow none of the samples of the string of bottles was saved overnight so I still do not have any direct visualization of his reported hematuria.  That being said, patient reports that hematuria has resolved at this point and is eager to discharge home.  I think that is reasonable from a urologic perspective and we can follow-up in clinic on the later date for cystoscopy and CT hematuria if indicated.  At this time, his symptoms seem to be due to a medication reaction.  Intake/Output from previous day: 08/08 0701 - 08/09 0700 In: 340 [P.O.:240; IV Piggyback:100] Out: 300 [Urine:300]  Intake/Output this shift: No intake/output data recorded.  Physical Exam:  General: Alert and oriented CV: No cyanosis Lungs: equal chest rise Abdomen: Soft, NTND, no rebound or guarding   Lab Results: Recent Labs    01/08/23 0931 01/08/23 1941 01/08/23 2342  HGB 13.8 12.3* 12.1*  HCT 42.0 36.9* 36.1*   BMET Recent Labs    01/08/23 0931 01/08/23 2342  NA 141 137  K 4.1 3.5  CL 108 103  CO2 24 24  GLUCOSE 89 110*  BUN 24* 19  CREATININE 1.38* 1.51*  CALCIUM 9.1 8.4*     Studies/Results: CT Renal Stone Study  Result Date: 01/08/2023 CLINICAL DATA:  Hematuria this morning.   History of kidney stones. EXAM: CT ABDOMEN AND PELVIS WITHOUT CONTRAST TECHNIQUE: Multidetector CT imaging of the abdomen and pelvis was performed following the standard protocol without IV contrast. RADIATION DOSE REDUCTION: This exam was performed according to the departmental dose-optimization program which includes automated exposure control, adjustment of the mA and/or kV according to patient size and/or use of iterative reconstruction technique. COMPARISON:  CT chest, abdomen, and pelvis dated September 19, 2022. FINDINGS: Lower chest: No acute abnormality. Hepatobiliary: No focal liver abnormality is seen. No gallstones, gallbladder wall thickening, or biliary dilatation. Pancreas: Unremarkable. No pancreatic ductal dilatation or surrounding inflammatory changes. Spleen: Unchanged splenomegaly.  No focal lesion. Adrenals/Urinary Tract: The adrenal glands are unremarkable. No renal calculi or hydronephrosis. Unchanged 4.1 cm left renal simple cyst. No follow-up imaging is recommended. The bladder is decompressed. Stomach/Bowel: Similar postsurgical changes at the gastroesophageal junction with unchanged small to moderate hiatal hernia. The stomach is otherwise within normal limits. No bowel wall thickening, distention, or surrounding inflammatory changes. Left-sided colonic diverticulosis. Diminutive or absent appendix. Vascular/Lymphatic: Aortic atherosclerosis. No enlarged abdominal or pelvic lymph nodes. Reproductive: Prostate is unremarkable. Other: Unchanged small fat containing right greater than left inguinal hernias. Unchanged tiny fat containing umbilical hernia. Prior ventral hernia repair. No free fluid or pneumoperitoneum. Musculoskeletal: No acute or significant osseous findings. Prior lumbar fusion and left total hip arthroplasty. IMPRESSION: 1. No acute intra-abdominal process. No urolithiasis. 2. Unchanged splenomegaly. 3. Unchanged small to moderate hiatal  hernia. 4.  Aortic Atherosclerosis  (ICD10-I70.0). Electronically Signed   By: Obie Dredge M.D.   On: 01/08/2023 10:27      LOS: 0 days   Elmon Kirschner, NP [facility] Pager: [phone]  01/09/2023, 11:28 AM

## 2023-01-09 NOTE — TOC Transition Note (Signed)
Transition of Care [facility]) - CM/SW Discharge Note   Patient Details  Name: [name] MRN: [id number] Date of Birth: 1950-08-03  Transition of Care [facility]) CM/SW Contact:  Tom-Johnson, Hershal Coria, RN Phone Number: 01/09/2023, 1:36 PM   Clinical Narrative:     Patient is scheduled for discharge today.  Outpatient f/u, hospital f/u and discharge instructions on AVS. No TOC needs or recommendations noted. Daughter, [name] to transport at discharge.  No further TOC needs noted.     Final next level of care: Home/Self Care Barriers to Discharge: Barriers Resolved   Patient Goals and CMS Choice CMS Medicare.gov Compare Post Acute Care list provided to:: Patient Choice offered to / list presented to : NA  Discharge Placement                  Patient to be transferred to facility by: Daughter Name of family member notified: [facility]    Discharge Plan and Services Additional resources added to the After Visit Summary for                  DME Arranged: N/A DME Agency: NA       HH Arranged: NA HH Agency: NA        Social Determinants of Health (SDOH) Interventions SDOH Screenings   Food Insecurity: No Food Insecurity (01/08/2023)  Housing: Low Risk  (01/08/2023)  Transportation Needs: No Transportation Needs (01/08/2023)  Utilities: Not At Risk (01/08/2023)  Alcohol Screen: Low Risk  (05/09/2020)  Depression (PHQ2-9): Low Risk  (05/09/2020)  Financial Resource Strain: Low Risk  (05/09/2020)  Physical Activity: Inactive (05/09/2020)  Social Connections: Unknown (10/15/2021)   Received from [facility]  Stress: No Stress Concern Present (05/09/2020)  Tobacco Use: Medium Risk (10/10/2022)   [facility] from [facility]     Readmission Risk Interventions    02/24/2022    2:01 PM  Readmission Risk Prevention Plan  Transportation Screening Complete  HRI or Home Care Consult Complete  Social Work Consult for Recovery  Care Planning/Counseling Complete  Palliative Care Screening Not Applicable  Medication Review Oceanographer) Complete

## 2023-01-19 ENCOUNTER — Encounter (INDEPENDENT_AMBULATORY_CARE_PROVIDER_SITE_OTHER): Payer: Self-pay | Admitting: *Deleted

## 2023-01-27 ENCOUNTER — Other Ambulatory Visit (HOSPITAL_COMMUNITY)
Admission: RE | Admit: 2023-01-27 | Discharge: 2023-01-27 | Disposition: A | Discharge status: Home / Self Care | Payer: Medicare Other | Source: Ambulatory Visit | Attending: Hematology and Oncology | Admitting: Hematology and Oncology

## 2023-01-27 DIAGNOSIS — C8307 Small cell B-cell lymphoma, spleen: Secondary | ICD-10-CM | POA: Diagnosis present

## 2023-01-27 LAB — CBC WITH DIFFERENTIAL/PLATELET
Abs Immature Granulocytes: 0.01 10*3/uL (ref 0.00–0.07)
Basophils Absolute: 0 10*3/uL (ref 0.0–0.1)
Basophils Relative: 0 %
Eosinophils Absolute: 0 10*3/uL (ref 0.0–0.5)
Eosinophils Relative: 0 %
HCT: 42.6 % (ref 39.0–52.0)
Hemoglobin: 14.1 g/dL (ref 13.0–17.0)
Immature Granulocytes: 0 %
Lymphocytes Relative: 32 %
Lymphs Abs: 1 10*3/uL (ref 0.7–4.0)
MCH: 30.3 pg (ref 26.0–34.0)
MCHC: 33.1 g/dL (ref 30.0–36.0)
MCV: 91.6 fL (ref 80.0–100.0)
Monocytes Absolute: 0.3 10*3/uL (ref 0.1–1.0)
Monocytes Relative: 10 %
Neutro Abs: 1.8 10*3/uL (ref 1.7–7.7)
Neutrophils Relative %: 58 %
Platelets: 62 10*3/uL — ABNORMAL LOW (ref 150–400)
RBC: 4.65 MIL/uL (ref 4.22–5.81)
RDW: 13.6 % (ref 11.5–15.5)
WBC: 3.1 10*3/uL — ABNORMAL LOW (ref 4.0–10.5)
nRBC: 0 % (ref 0.0–0.2)

## 2023-01-27 LAB — COMPREHENSIVE METABOLIC PANEL
ALT: 15 U/L (ref 0–44)
AST: 19 U/L (ref 15–41)
Albumin: 4.3 g/dL (ref 3.5–5.0)
Alkaline Phosphatase: 49 U/L (ref 38–126)
Anion gap: 11 (ref 5–15)
BUN: 20 mg/dL (ref 8–23)
CO2: 23 mmol/L (ref 22–32)
Calcium: 8.8 mg/dL — ABNORMAL LOW (ref 8.9–10.3)
Chloride: 104 mmol/L (ref 98–111)
Creatinine, Ser: 1.38 mg/dL — ABNORMAL HIGH (ref 0.61–1.24)
GFR, Estimated: 55 mL/min — ABNORMAL LOW (ref >60)
Glucose, Bld: 101 mg/dL — ABNORMAL HIGH (ref 70–99)
Potassium: 4 mmol/L (ref 3.5–5.1)
Sodium: 138 mmol/L (ref 135–145)
Total Bilirubin: 1 mg/dL (ref 0.3–1.2)
Total Protein: 6.9 g/dL (ref 6.5–8.1)

## 2023-01-27 LAB — LACTATE DEHYDROGENASE: LDH: 113 U/L (ref 98–192)

## 2023-03-23 ENCOUNTER — Inpatient Hospital Stay: Payer: Medicare Other | Attending: Hematology

## 2023-03-23 DIAGNOSIS — C884 Extranodal marginal zone b-cell lymphoma of mucosa-associated lymphoid tissue (malt-lymphoma) not having achieved remission: Secondary | ICD-10-CM | POA: Insufficient documentation

## 2023-03-23 DIAGNOSIS — Z85820 Personal history of malignant melanoma of skin: Secondary | ICD-10-CM | POA: Insufficient documentation

## 2023-03-23 DIAGNOSIS — Z885 Allergy status to narcotic agent status: Secondary | ICD-10-CM | POA: Insufficient documentation

## 2023-03-23 DIAGNOSIS — R319 Hematuria, unspecified: Secondary | ICD-10-CM | POA: Diagnosis not present

## 2023-03-23 DIAGNOSIS — Z8249 Family history of ischemic heart disease and other diseases of the circulatory system: Secondary | ICD-10-CM | POA: Insufficient documentation

## 2023-03-23 DIAGNOSIS — Z808 Family history of malignant neoplasm of other organs or systems: Secondary | ICD-10-CM | POA: Insufficient documentation

## 2023-03-23 DIAGNOSIS — R972 Elevated prostate specific antigen [PSA]: Secondary | ICD-10-CM | POA: Diagnosis not present

## 2023-03-23 DIAGNOSIS — Z79899 Other long term (current) drug therapy: Secondary | ICD-10-CM | POA: Insufficient documentation

## 2023-03-23 DIAGNOSIS — Z87891 Personal history of nicotine dependence: Secondary | ICD-10-CM | POA: Insufficient documentation

## 2023-03-23 DIAGNOSIS — Z7901 Long term (current) use of anticoagulants: Secondary | ICD-10-CM | POA: Insufficient documentation

## 2023-03-23 DIAGNOSIS — D696 Thrombocytopenia, unspecified: Secondary | ICD-10-CM | POA: Diagnosis present

## 2023-03-23 DIAGNOSIS — C8307 Small cell B-cell lymphoma, spleen: Secondary | ICD-10-CM

## 2023-03-23 DIAGNOSIS — Z807 Family history of other malignant neoplasms of lymphoid, hematopoietic and related tissues: Secondary | ICD-10-CM | POA: Insufficient documentation

## 2023-03-23 DIAGNOSIS — Z8719 Personal history of other diseases of the digestive system: Secondary | ICD-10-CM | POA: Diagnosis not present

## 2023-03-23 DIAGNOSIS — I10 Essential (primary) hypertension: Secondary | ICD-10-CM | POA: Insufficient documentation

## 2023-03-23 LAB — COMPREHENSIVE METABOLIC PANEL
ALT: 16 U/L (ref 0–44)
AST: 18 U/L (ref 15–41)
Albumin: 3.9 g/dL (ref 3.5–5.0)
Alkaline Phosphatase: 41 U/L (ref 38–126)
Anion gap: 6 (ref 5–15)
BUN: 18 mg/dL (ref 8–23)
CO2: 25 mmol/L (ref 22–32)
Calcium: 8.5 mg/dL — ABNORMAL LOW (ref 8.9–10.3)
Chloride: 105 mmol/L (ref 98–111)
Creatinine, Ser: 1.34 mg/dL — ABNORMAL HIGH (ref 0.61–1.24)
GFR, Estimated: 57 mL/min — ABNORMAL LOW (ref >60)
Glucose, Bld: 97 mg/dL (ref 70–99)
Potassium: 4.1 mmol/L (ref 3.5–5.1)
Sodium: 136 mmol/L (ref 135–145)
Total Bilirubin: 0.8 mg/dL (ref 0.3–1.2)
Total Protein: 6.5 g/dL (ref 6.5–8.1)

## 2023-03-23 LAB — CBC WITH DIFFERENTIAL/PLATELET
Abs Immature Granulocytes: 0.02 10*3/uL (ref 0.00–0.07)
Basophils Absolute: 0 10*3/uL (ref 0.0–0.1)
Basophils Relative: 0 %
Eosinophils Absolute: 0 10*3/uL (ref 0.0–0.5)
Eosinophils Relative: 0 %
HCT: 40.4 % (ref 39.0–52.0)
Hemoglobin: 13.1 g/dL (ref 13.0–17.0)
Immature Granulocytes: 1 %
Lymphocytes Relative: 19 %
Lymphs Abs: 0.6 10*3/uL — ABNORMAL LOW (ref 0.7–4.0)
MCH: 30.2 pg (ref 26.0–34.0)
MCHC: 32.4 g/dL (ref 30.0–36.0)
MCV: 93.1 fL (ref 80.0–100.0)
Monocytes Absolute: 0.3 10*3/uL (ref 0.1–1.0)
Monocytes Relative: 8 %
Neutro Abs: 2.3 10*3/uL (ref 1.7–7.7)
Neutrophils Relative %: 72 %
Platelets: 54 10*3/uL — ABNORMAL LOW (ref 150–400)
RBC: 4.34 MIL/uL (ref 4.22–5.81)
RDW: 13.1 % (ref 11.5–15.5)
WBC: 3.2 10*3/uL — ABNORMAL LOW (ref 4.0–10.5)
nRBC: 0 % (ref 0.0–0.2)

## 2023-03-23 LAB — LACTATE DEHYDROGENASE: LDH: 95 U/L — ABNORMAL LOW (ref 98–192)

## 2023-03-30 ENCOUNTER — Inpatient Hospital Stay (HOSPITAL_BASED_OUTPATIENT_CLINIC_OR_DEPARTMENT_OTHER): Payer: Medicare Other | Admitting: Hematology

## 2023-03-30 VITALS — BP 125/79 | HR 69 | Temp 97.7°F | Resp 18 | Ht 72.0 in | Wt 245.8 lb

## 2023-03-30 DIAGNOSIS — C8307 Small cell B-cell lymphoma, spleen: Secondary | ICD-10-CM

## 2023-03-30 DIAGNOSIS — D709 Neutropenia, unspecified: Secondary | ICD-10-CM | POA: Diagnosis not present

## 2023-03-30 DIAGNOSIS — D696 Thrombocytopenia, unspecified: Secondary | ICD-10-CM | POA: Diagnosis not present

## 2023-03-30 DIAGNOSIS — C884 Extranodal marginal zone b-cell lymphoma of mucosa-associated lymphoid tissue (malt-lymphoma) not having achieved remission: Secondary | ICD-10-CM | POA: Diagnosis not present

## 2023-03-30 NOTE — Patient Instructions (Addendum)
[facility]  Discharge Instructions  You were seen and examined today by Dr. Ellin Saba.  Dr. Ellin Saba discussed your most recent lab work which revealed that everything looks stable.  Continue taking the Brukinsa as prescribed.  Follow-up as scheduled in 6 months.    Thank you for choosing  [facility] to provide your oncology and hematology care.   To afford each patient quality time with our provider, please arrive at least 15 minutes before your scheduled appointment time. You may need to reschedule your appointment if you arrive late (10 or more minutes). Arriving late affects you and other patients whose appointments are after yours.  Also, if you miss three or more appointments without notifying the office, you may be dismissed from the clinic at the provider's discretion.    Again, thank you for choosing [facility].  Our hope is that these requests will decrease the amount of time that you wait before being seen by our physicians.   If you have a lab appointment with the Cancer Center - please note that after April 8th, all labs will be drawn in the cancer center.  You do not have to check in or register with the main entrance as you have in the past but will complete your check-in at the cancer center.            _____________________________________________________________  Should you have questions after your visit to [facility], please contact our office at [phone] and follow the prompts.  Our office hours are 8:00 a.m. to 4:30 p.m. Monday - Thursday and 8:00 a.m. to 2:30 p.m. Friday.  Please note that voicemails left after 4:00 p.m. may not be returned until the following business day.  We are closed weekends and all major holidays.  You do have access to a nurse 24-7, just call the main number to the clinic [phone] and do not press any options, hold on the line and a nurse will answer  the phone.    For prescription refill requests, have your pharmacy contact our office and allow 72 hours.    Masks are no longer required in the cancer centers. If you would like for your care team to wear a mask while they are taking care of you, please let them know. You may have one support person who is at least 71 years old accompany you for your appointments.

## 2023-03-30 NOTE — Progress Notes (Signed)
[name] [address] [place] [address]    Clinic Day:  03/30/2023  Referring physician: Elfredia Nevins, MD  Patient Care Team: [facility] Nevins, MD as PCP - General (Internal Medicine) [facility] Ishikawa, MD as PCP - Cardiology (Cardiology) [facility] Massed, MD as Consulting Physician (Hematology)   ASSESSMENT & PLAN:   Assessment: 1.  Moderate thrombocytopenia: -Seen as referral from [name] for decreased platelet count of 80 on CBC on 10/21/2018. -Ex-smoker, quit in 1982, smoked 1 to 2 packs/day for 10 years.  He also spent 2 years at [name]. -CT abdomen on 11/08/2018 showed normal size spleen with no abdominal or pelvic pathology. -BMBX on 03/01/2019 showed slightly hypercellular marrow for age with trilineage hematopoiesis including abundant megakaryocytes and predominantly normal morphology.  Overall myeloid changes are nonspecific and not diagnostic for MDS.  Chromosome analysis was normal.  MDS FISH panel was normal.  Core biopsy showed small lymphoid aggregates, features are not considered specific or diagnostic of lymphoproliferative disorder. -He was treated with pulsed dexamethasone around [facility] 01/27/2020 with improvement of platelet count to 70 K.  He could not tolerate dexamethasone due to sleeplessness. -He was also treated with prednisone 60 mg daily on 05/22/2020.  He took it only for 2 to 3 days and stopped it due to inability to sleep. -Treated with immunoglobulin from 05/14/2020 through 05/18/2020. -PET scan on 06/05/2020 view shows no hypermetabolic lymph nodes in Ruben chest, abdomen or pelvis.  Splenomegaly measuring 18 cm with normal metabolic activity. -Bone marrow biopsy on 06/26/2020 with mildly hypercellular marrow, no significant dysplasia.  Scattered large lymphoid aggregates with a predominance of B cells.  Flow cytometry was nondiagnostic.  There is no expression of CD5 or CD10 on [facility] B cells. 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  However no clonality is demonstrated in [facility] kappa light chain gene (IGK) -Myeloma FISH panel was negative.  Cytogenetics was 82, XY. -He was evaluated as second opinion by Dr. 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 - Received 4 cycles of rituximab from 09/05/2020 through 10/03/2020. - BMBx (April 2024): This showed about 15% lymphoma cells and possible megakaryocytic dysplasia.  Myeloid NGS panel showed TET2 mutation. - CT CAP (09/09/2022): At [name] showed no lymphadenopathy.  Splenomegaly slightly increased from prior measuring 17 cm. - Zanubrutinib 160 mg twice daily started on 12/17/2022   2.  Malignant melanoma: -Melanoma resected on Ruben left side of [facility] abdomen with left axillary sentinel lymph node biopsy at [name] 14 years ago.  Reportedly stage III.    Plan: 1.  Splenic marginal zone lymphoma: - He is started on Brukinsa 160 mg twice daily on 12/17/2022. - He is tolerating it very well.  Denies any major side effects. - Denies any B symptoms.  Physical exam: No palpable adenopathy. - He is having workup for elevated PSA of around 8.  He follows up with Dr. Cardell [facility] at [name]. - Reviewed labs from 03/23/2023: Creatinine 1.34.  LFTs normal.  LDH normal.  White count is 3.2 with ANC of 2.3.  Platelet count is normal at 54. - Continue Brukinsa 160 mg twice daily.  He will have follow-up with Dr. Nolen Cochran next month and likely scans following that. - He wants to continue follow-up with [place].  We will see him back in 6 months.  2.  Unprovoked pulmonary embolism (diagnosis 05/05/2021): -  He was taking Xarelto 20 mg daily.  Since Brukinsa was started, he developed hematuria.  Xarelto was dose reduced to 10 mg daily.    No orders of Ruben defined types were placed in this encounter.     [facility] Massed, MD    10/28/20243:30 PM  CHIEF COMPLAINT:   Diagnosis: splenic marginal zone lymphoma    Cancer Staging  No matching staging information was found for [facility] patient.    Prior Therapy: 1. pulsed dexamethasone around [facility] 01/27/2020  2. Prednisone 60 mg in 05/2020  3. immunoglobulin from 05/14/2020 through 05/18/2020 4. Rituximab, 4 cycles, 09/05/2020 - 10/03/2020   Current Therapy:  surveillance   HISTORY OF PRESENT ILLNESS:   Oncology History   No history exists.     INTERVAL HISTORY:   [name] is a 72 y.o. male seen for follow-up of splenic marginal zone lymphoma.  Since last visit, he was started on Brukinsa 160 mg twice daily which was started around 12/17/2022.  Xarelto dose was also decreased to 10 mg daily.  Reports appetite 100% and energy level 75%.  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   Coronary artery disease    mild by 09/08/13 cath [facility]   GERD (gastroesophageal reflux disease)    barretts esophagus   H/O hiatal hernia    Headache(784.0)    Hypercholesteremia    Hypertension    PONV (postoperative nausea and vomiting)    trouble breathing after last [facility] (at [facility])   Shingles    10   Sleep apnea    not tested   Tumor liver    no problems    Surgical History: Past Surgical History:  Procedure Laterality Date   ABDOMINAL [facility]     x3   back surgeries     x 9   BACK [facility]     x8   BIOPSY N/A 03/28/2013   Procedure: Distal Esophageal Biopsy;  Surgeon: [facility]o, MD;  Location: [facility];  Service: Endoscopy;  Laterality: N/A;   BIOPSY  05/30/2016   Procedure: BIOPSY;  Surgeon: [facility], MD;  [facility]: [facility];  Service: Endoscopy;;   BIOPSY  02/19/2018    Procedure: BIOPSY;  Surgeon: [facility]o, MD;  [facility]: [facility];  Service: Endoscopy;;  Barret's esophagus   BIOPSY  08/29/2020   Procedure: BIOPSY;  Surgeon: [facility] Hippo, MD;  [facility]: [facility];  Service: Endoscopy;;   BIOPSY  12/31/2021   Procedure: BIOPSY;  Surgeon: Dolores Frame, MD;  [facility]: [facility];  Service: Gastroenterology;;   CARDIAC CATHETERIZATION  2020   CHEST [facility]     lung incisional hernia for hernia   COLONOSCOPY WITH PROPOFOL N/A 02/19/2018   Procedure: COLONOSCOPY WITH PROPOFOL;  Surgeon: [facility] Hippo, MD;  [facility]: [facility];  Service: Endoscopy;  Laterality: N/A;  1:00   CORONARY ANGIOPLASTY WITH STENT PLACEMENT  2020   2 blockages: 1 stent   ESOPHAGOGASTRODUODENOSCOPY (EGD) WITH PROPOFOL N/A 03/28/2013   Procedure: ESOPHAGOGASTRODUODENOSCOPY (EGD) WITH PROPOFOL;  Surgeon: [facility]o, MD;  Location: [facility];  Service: Endoscopy;  Laterality: N/A;  GE junction @ 38, proximal  margin @37    ESOPHAGOGASTRODUODENOSCOPY (EGD) WITH PROPOFOL N/A 05/30/2016   Procedure: ESOPHAGOGASTRODUODENOSCOPY (EGD) WITH PROPOFOL;  Surgeon: [facility] Hippo, MD;  [facility];  Service: Endoscopy;  Laterality: N/A;  955   ESOPHAGOGASTRODUODENOSCOPY (EGD) WITH PROPOFOL N/A 02/19/2018   Procedure: ESOPHAGOGASTRODUODENOSCOPY (EGD) WITH PROPOFOL;  Surgeon: [facility] Hippo, MD;  [facility];  Service: Endoscopy;  Laterality: N/A;   ESOPHAGOGASTRODUODENOSCOPY (EGD) WITH PROPOFOL N/A 08/29/2020   Procedure: ESOPHAGOGASTRODUODENOSCOPY (EGD) WITH PROPOFOL;  Surgeon: [facility] Hippo, MD;  [facility];  Service: Endoscopy;  Laterality: N/A;  AM   ESOPHAGOGASTRODUODENOSCOPY (EGD) WITH PROPOFOL N/A 12/31/2021   Procedure: ESOPHAGOGASTRODUODENOSCOPY (EGD) WITH PROPOFOL;  Surgeon: Dolores Frame, MD;  [facility];  Service: Gastroenterology;  Laterality: N/A;  100 ASA 2   HEMORRHOID [facility]     x2    HERNIA REPAIR     HIATAL HERNIA REPAIR     JOINT REPLACEMENT     lft knee   KNEE ARTHROPLASTY     lft   KNEE ARTHROSCOPY Right    LEG [facility]     x4 crushed    LITHOTRIPSY     MELANOMA EXCISION     lft abdomen   NECK [facility]  2012   POLYPECTOMY  02/19/2018   Procedure: POLYPECTOMY;  Surgeon: [facility] Hippo, MD;  [facility]: [facility];  Service: Endoscopy;;  proximal transverse colon (CB x1), descending colon (CBx3)   POSTERIOR CERVICAL FUSION/FORAMINOTOMY  08/18/2011   Procedure: POSTERIOR CERVICAL FUSION/FORAMINOTOMY LEVEL 3;  Surgeon: Cristi Loron, MD;  Location: [facility];  Service: Neurosurgery;  Laterality: N/A;  Cervical three-four, four-five, five-six posterior cervical  fusion with instrumentation; Right cervical three-four, four-five laminotomy   REVISION TOTAL HIP ARTHROPLASTY Left    SHOULDER ARTHROSCOPY     rt bith defect , spur, rotator cuff   SPINAL CORD STIMULATOR IMPLANT     rt hip   SPINAL CORD STIMULATOR INSERTION N/A 11/03/2016   Procedure: LUMBAR SPINAL CORD STIMULATOR REVISION;  Surgeon: Tressie Stalker, MD;  [facility];  Service: Neurosurgery;  Laterality: N/A;   THYROID LOBECTOMY     rt    Social History: Social History   Socioeconomic History   Marital status: Married    Spouse name: Not on file   Number of children: Not on file   Years of education: Not on file   Highest education level: Not on file  Occupational History   Occupation: retired  Tobacco Use   Smoking status: Former    Current packs/day: 0.00    Average packs/day: 4.0 packs/day for 10.0 years (40.0 ttl pk-yrs)    Types: Cigarettes    Start date: 08/13/1970    Quit date: 08/12/1980    Years since quitting: 42.6   Smokeless tobacco: Never   Tobacco comments:    occ wine  Vaping Use   Vaping status: Never Used  Substance and Sexual Activity   Alcohol use: Not Currently   Drug use: No   Sexual activity: Yes    Birth control/protection: None  Other Topics Concern    Not on file  Social History Narrative   Not on file   Social Determinants of Health   Financial Resource Strain: Low Risk  (05/09/2020)   Overall Financial Resource Strain (CARDIA)    Difficulty of Paying Living Expenses: Not hard at all  Food Insecurity: No Food Insecurity (01/08/2023)  Hunger Vital Sign    Worried About Running Out of Food in [facility] Last Year: Never true    Ran Out of Food in [facility] Last Year: Never true  Transportation Needs: No Transportation Needs (01/08/2023)   PRAPARE - Administrator, Civil Service (Medical): No    Lack of Transportation (Non-Medical): No  Physical Activity: Inactive (05/09/2020)   Exercise Vital Sign    Days of Exercise per Week: 0 days    Minutes of Exercise per Session: 0 min  Stress: No Stress Concern Present (05/09/2020)   [employer] of Occupational Health - Occupational Stress Questionnaire    Feeling of Stress : Not at all  Social Connections: Unknown (10/15/2021)   Received from [facility]   Social Network    Social Network: Not on file  Intimate Partner Violence: Not At Risk (01/08/2023)   Humiliation, Afraid, Rape, and Kick questionnaire    Fear of Current or Ex-Partner: No    Emotionally Abused: No    Physically Abused: No    Sexually Abused: No    Family History: Family History  Problem Relation Age of Onset   Heart disease Father    Aneurysm Mother    Aneurysm Sister    Heart disease Paternal Grandmother    Heart disease Paternal Grandfather    Heart disease Maternal Grandfather    Heart disease Brother        heart transplant   Aneurysm Other        pt states several members have aortic aneurysm   Skin cancer Other        several members   Lymphoma Brother    Aneurysm Maternal Grandmother     Current Medications:  Current Outpatient Medications:    Bioflavonoid Products (ESTER-C) 500-550 MG TABS, Take 1 tablet by mouth daily., Disp: , Rfl:    Calcium Carbonate-Vitamin D 600-10  MG-MCG TABS, Take 1 tablet by mouth 2 (two) times daily. , Disp: , Rfl:    dexlansoprazole (DEXILANT) 60 MG capsule, Take 60 mg by mouth daily. , Disp: , Rfl:    diazepam (VALIUM) 10 MG tablet, Take 10 mg by mouth in Ruben morning and at bedtime., Disp: , Rfl:    fexofenadine (ALLEGRA) 180 MG tablet, Take 180 mg by mouth every evening., Disp: , Rfl:    fish oil-omega-3 fatty acids 1000 MG capsule, Take 1 g by mouth 2 (two) times daily., Disp: , Rfl:    hydrocortisone (CORTEF) 10 MG tablet, Take 20mg  in am and 10 mg PM. (Patient taking differently: Take 10-15 mg by mouth See admin instructions. Take 10 mg by mouth in Ruben morning, hen take 5 mg (split 10 mg in half) by mouth in Ruben evening per patient), Disp: 90 tablet, Rfl: 2   lisinopril (ZESTRIL) 10 MG tablet, Take 10 mg by mouth daily., Disp: , Rfl:    metoCLOPramide (REGLAN) 10 MG tablet, Take 1 tablet by mouth in Ruben morning and at bedtime., Disp: , Rfl:    montelukast (SINGULAIR) 10 MG tablet, Take 10 mg by mouth daily. , Disp: , Rfl:    Multiple Vitamin (MULITIVITAMIN WITH MINERALS) TABS, Take 1 tablet by mouth daily., Disp: , Rfl:    ondansetron (ZOFRAN) 8 MG tablet, Take 1 tablet (8 mg total) by mouth every 8 (eight) hours as needed for vomiting or nausea., Disp: 30 tablet, Rfl: 2   Probiotic Product (PROBIOTIC PO), Take 1 tablet by mouth daily., Disp: , Rfl:  ranolazine (RANEXA) 500 MG 12 hr tablet, Take 500 mg by mouth 2 (two) times daily., Disp: , Rfl:    rosuvastatin (CRESTOR) 10 MG tablet, Take 5 mg by mouth daily., Disp: , Rfl:    BRUKINSA 80 MG capsule, Take 160 mg by mouth 2 (two) times daily., Disp: , Rfl:    Allergies: Allergies  Allergen Reactions   Demerol Other (See Comments)    "Makes me crazy."    REVIEW OF SYSTEMS:   Review of Systems  Constitutional:  Negative for chills, fatigue and fever.  HENT:   Negative for lump/mass, mouth sores, nosebleeds, sore throat and trouble swallowing.   Eyes:  Negative for eye  problems.  Respiratory:  Positive for shortness of breath. Negative for cough.   Cardiovascular:  Negative for chest pain, leg swelling and palpitations.  Gastrointestinal:  Positive for constipation. Negative for abdominal pain, diarrhea, nausea and vomiting.  Genitourinary:  Negative for bladder incontinence, difficulty urinating, dysuria, frequency, hematuria and nocturia.   Musculoskeletal:  Negative for arthralgias, back pain, flank pain, myalgias and neck pain.  Skin:  Negative for itching and rash.  Neurological:  Negative for dizziness and numbness.  Hematological:  Does not bruise/bleed easily.  Psychiatric/Behavioral:  Negative for depression and suicidal ideas. Ruben patient is not nervous/anxious.   All other systems reviewed and are negative.    VITALS:   Blood pressure 125/79, pulse 69, temperature 97.7 F (36.5 C), temperature source Oral, resp. rate 18, height 6' (1.829 m), weight 245 lb 12.8 oz (111.5 kg), SpO2 96%.  Wt Readings from Last 3 Encounters:  03/30/23 245 lb 12.8 oz (111.5 kg)  01/08/23 242 lb 15.2 oz (110.2 kg)  09/19/22 244 lb (110.7 kg)    Body mass index is 33.34 kg/m.  Performance status (ECOG): 1 - Symptomatic but completely ambulatory  PHYSICAL EXAM:   Physical Exam Vitals and nursing note reviewed. Exam conducted with a chaperone present.  Constitutional:      Appearance: Normal appearance.  Cardiovascular:     Rate and Rhythm: Normal rate and regular rhythm.     Pulses: Normal pulses.     Heart sounds: Normal heart sounds.  Pulmonary:     Effort: Pulmonary effort is normal.     Breath sounds: Normal breath sounds.  Abdominal:     Palpations: Abdomen is soft. There is no hepatomegaly, splenomegaly or mass.     Tenderness: There is no abdominal tenderness.  Musculoskeletal:     Right lower leg: No edema.     Left lower leg: No edema.  Lymphadenopathy:     Cervical: No cervical adenopathy.     Right cervical: No superficial, deep or  posterior cervical adenopathy.    Left cervical: No superficial, deep or posterior cervical adenopathy.     Upper Body:     Right upper body: No supraclavicular or axillary adenopathy.     Left upper body: No supraclavicular or axillary adenopathy.  Neurological:     General: No focal deficit present.     Mental Status: He is alert and oriented to person, place, and time.  Psychiatric:        Mood and Affect: Mood normal.        Behavior: Behavior normal.     LABS:      Latest Ref Rng & Units 03/23/2023   12:03 PM 01/27/2023   11:33 AM 01/08/2023   11:42 PM  CBC  WBC 4.0 - 10.5 K/uL 3.2  3.1  2.8  Hemoglobin 13.0 - 17.0 g/dL 16.1  09.6  04.5   Hematocrit 39.0 - 52.0 % 40.4  42.6  36.1   Platelets 150 - 400 K/uL 54  62  42       Latest Ref Rng & Units 03/23/2023   12:03 PM 01/27/2023   11:33 AM 01/08/2023   11:42 PM  CMP  Glucose 70 - 99 mg/dL 97  409  811   BUN 8 - 23 mg/dL 18  20  19    Creatinine 0.61 - 1.24 mg/dL 9.14  7.82  9.56   Sodium 135 - 145 mmol/L 136  138  137   Potassium 3.5 - 5.1 mmol/L 4.1  4.0  3.5   Chloride 98 - 111 mmol/L 105  104  103   CO2 22 - 32 mmol/L 25  23  24    Calcium 8.9 - 10.3 mg/dL 8.5  8.8  8.4   Total Protein 6.5 - 8.1 g/dL 6.5  6.9    Total Bilirubin 0.3 - 1.2 mg/dL 0.8  1.0    Alkaline Phos 38 - 126 U/L 41  49    AST 15 - 41 U/L 18  19    ALT 0 - 44 U/L 16  15       No results found for: "CEA1", "CEA" / No results found for: "CEA1", "CEA" No results found for: "PSA1" No results found for: "CAN199" No results found for: "CAN125"  Lab Results  Component Value Date   TOTALPROTELP 6.9 06/09/2019   ALBUMINELP 4.4 06/09/2019   A1GS 0.2 06/09/2019   A2GS 0.5 06/09/2019   BETS 0.8 06/09/2019   GAMS 1.0 06/09/2019   MSPIKE Not Observed 06/09/2019   SPEI Comment 06/09/2019   Lab Results  Component Value Date   TIBC 105 (L) 02/06/2022   FERRITIN 451 (H) 02/06/2022   IRONPCTSAT 18 02/06/2022   Lab Results  Component Value Date    LDH 95 (L) 03/23/2023   LDH 113 01/27/2023   LDH 123 11/12/2022     STUDIES:   No results found.

## 2023-04-23 ENCOUNTER — Encounter (HOSPITAL_COMMUNITY): Payer: Self-pay

## 2023-04-23 ENCOUNTER — Emergency Department (HOSPITAL_COMMUNITY)
Admission: EM | Admit: 2023-04-23 | Discharge: 2023-04-23 | Disposition: A | Discharge status: Home / Self Care | Payer: Medicare Other | Attending: Emergency Medicine | Admitting: Emergency Medicine

## 2023-04-23 ENCOUNTER — Other Ambulatory Visit: Payer: Self-pay

## 2023-04-23 DIAGNOSIS — Z79899 Other long term (current) drug therapy: Secondary | ICD-10-CM | POA: Diagnosis not present

## 2023-04-23 DIAGNOSIS — I10 Essential (primary) hypertension: Secondary | ICD-10-CM | POA: Diagnosis not present

## 2023-04-23 DIAGNOSIS — N3000 Acute cystitis without hematuria: Secondary | ICD-10-CM | POA: Diagnosis not present

## 2023-04-23 DIAGNOSIS — I2510 Atherosclerotic heart disease of native coronary artery without angina pectoris: Secondary | ICD-10-CM | POA: Insufficient documentation

## 2023-04-23 DIAGNOSIS — Z7901 Long term (current) use of anticoagulants: Secondary | ICD-10-CM | POA: Diagnosis not present

## 2023-04-23 DIAGNOSIS — C8510 Unspecified B-cell lymphoma, unspecified site: Secondary | ICD-10-CM | POA: Insufficient documentation

## 2023-04-23 DIAGNOSIS — R339 Retention of urine, unspecified: Secondary | ICD-10-CM | POA: Diagnosis present

## 2023-04-23 LAB — URINALYSIS, ROUTINE W REFLEX MICROSCOPIC
Bilirubin Urine: NEGATIVE
Glucose, UA: NEGATIVE mg/dL
Ketones, ur: NEGATIVE mg/dL
Leukocytes,Ua: NEGATIVE
Nitrite: POSITIVE — AB
Protein, ur: NEGATIVE mg/dL
RBC / HPF: >50 RBC/hpf (ref 0–5)
Specific Gravity, Urine: 1.006 (ref 1.005–1.030)
pH: 6 (ref 5.0–8.0)

## 2023-04-23 MED ORDER — CEFADROXIL 500 MG PO CAPS: 500.0000 mg | ORAL_CAPSULE | Freq: Two times a day (BID) | ORAL | 0 refills | Status: AC

## 2023-04-23 MED ORDER — CEFADROXIL 500 MG PO CAPS
500.0000 mg | ORAL_CAPSULE | Freq: Once | ORAL | Status: AC
  Administered 2023-04-23: 500 mg via ORAL
  Filled 2023-04-23: qty 1

## 2023-04-23 NOTE — ED Notes (Signed)
Pt is on chemo pills of bone marrow CA

## 2023-04-23 NOTE — ED Triage Notes (Signed)
Last urination around 14:00 Only drips On Tuesday seen by urology and had a scope for enlarged prostate

## 2023-04-23 NOTE — Discharge Instructions (Addendum)
As we discussed, we have placed a Foley catheter in your bladder given that you are unable to urinate without it.  Please discuss management of this with your urologist at your appointment tomorrow.  Also the urine sample that we tested did show some findings concerning for UTI.  We have cultured this sample and if anything grows that is not treatable with your antibiotics, then you will receive a phone call from a pharmacist discussing alterations and management of this.  I have given you a prescription for an antibiotic for you to take as prescribed in its entirety.  We have given you the first dose this evening as well.  Return if development of any new or worsening symptoms.

## 2023-04-23 NOTE — ED Provider Notes (Signed)
[address] EMERGENCY DEPARTMENT AT [facility] Provider Note   CSN: [id number] Arrival date & time: 04/23/23  2111     History  Chief Complaint  Patient presents with   Urinary Retention    [name] is a 72 y.o. male.  Patient with history of anxiety, AAA, arthritis, Barrett's esophagus, B-cell lymphoma currently on oral chemotherapy, pulmonary embolism on Xarelto, CAD, hypertension presents today with urinary retention.  He states he had a cystoscopy with urology on Tuesday and was told he has an enlarged prostate.  States he has not been able to urinate since 2 PM today and he is having significant pain in his low pelvic region.  He has an appointment with his urologist for follow-up at 2 PM tomorrow.  States he has had to have a Foley catheter in the past.  Denies any dysuria.  No fevers or chills.  No flank pain.   The history is provided by the patient. No language interpreter was used.       Home Medications Prior to Admission medications   Medication Sig Start Date End Date Taking? Authorizing Provider  BRUKINSA 80 MG capsule Take 160 mg by mouth 2 (two) times daily.   Yes [provider]  Calcium Carbonate-Vitamin D 600-10 MG-MCG TABS Take 1 tablet by mouth 2 (two) times daily.    Yes [provider]  cefadroxil (DURICEF) 500 MG capsule Take 1 capsule (500 mg total) by mouth 2 (two) times daily for 7 days. 04/23/23 04/30/23 Yes Gerardine Peltz A, PA-C  cyclobenzaprine (FLEXERIL) 10 MG tablet Take 10 mg by mouth in the morning and at bedtime.   Yes [provider]  dexlansoprazole (DEXILANT) 60 MG capsule Take 60 mg by mouth daily.    Yes [provider]  diazepam (VALIUM) 10 MG tablet Take 10 mg by mouth in the morning and at bedtime.   Yes [provider]  diltiazem (CARDIZEM) 30 MG tablet Take 30 mg by mouth 2 (two) times daily.   Yes [provider]  fexofenadine (ALLEGRA) 180 MG tablet Take 180 mg by  mouth every evening.   Yes [provider]  fish oil-omega-3 fatty acids 1000 MG capsule Take 1 g by mouth 2 (two) times daily.   Yes [provider]  hydrocortisone (CORTEF) 10 MG tablet Take 20mg  in am and 10 mg PM. Patient taking differently: Take 10-15 mg by mouth See admin instructions. Take 10 mg by mouth in the morning, hen take 5 mg (split 10 mg in half) by mouth in the evening per patient 02/28/22  Yes Vassie Loll, MD  lisinopril (ZESTRIL) 10 MG tablet Take 10 mg by mouth daily.   Yes [provider]  metoCLOPramide (REGLAN) 10 MG tablet Take 1 tablet by mouth in the morning and at bedtime. 08/03/20  Yes [provider]  mirabegron ER (MYRBETRIQ) 25 MG TB24 tablet Take 25 mg by mouth daily.   Yes [provider]  montelukast (SINGULAIR) 10 MG tablet Take 10 mg by mouth daily.    Yes [provider]  Multiple Vitamin (MULITIVITAMIN WITH MINERALS) TABS Take 1 tablet by mouth daily.   Yes [provider]  Probiotic Product (PROBIOTIC PO) Take 1 tablet by mouth daily.   Yes [provider]  ranolazine (RANEXA) 500 MG 12 hr tablet Take 500 mg by mouth 2 (two) times daily. 04/23/20  Yes [provider]  rivaroxaban (XARELTO) 10 MG TABS tablet Take 10 mg by  mouth daily.   Yes [provider]  rosuvastatin (CRESTOR) 10 MG tablet Take 5 mg by mouth daily.   Yes [provider]  tamsulosin (FLOMAX) 0.4 MG CAPS capsule Take 0.4 mg by mouth daily after supper.   Yes [provider]      Allergies    Demerol    Review of Systems   Review of Systems  All other systems reviewed and are negative.   Physical Exam Updated Vital Signs BP 110/69   Pulse 76   Temp 97.9 F (36.6 C) (Oral)   Resp 18   Ht 5\' 11"  (1.803 m)   Wt 108 kg   SpO2 92%   BMI 33.19 kg/m  Physical Exam Vitals and nursing note reviewed.  Constitutional:      General: He is not in acute distress.    Appearance:  Normal appearance. He is normal weight. He is not ill-appearing, toxic-appearing or diaphoretic.  HENT:     Head: Normocephalic and atraumatic.  Cardiovascular:     Rate and Rhythm: Normal rate and regular rhythm.     Heart sounds: Normal heart sounds.  Pulmonary:     Effort: Pulmonary effort is normal. No respiratory distress.     Breath sounds: Normal breath sounds.  Abdominal:     General: Abdomen is flat.     Palpations: Abdomen is soft.     Tenderness: There is no abdominal tenderness.  Musculoskeletal:        General: Normal range of motion.     Cervical back: Normal range of motion.  Skin:    General: Skin is warm and dry.  Neurological:     General: No focal deficit present.     Mental Status: He is alert.  Psychiatric:        Mood and Affect: Mood normal.        Behavior: Behavior normal.     ED Results / Procedures / Treatments   Labs (all labs ordered are listed, but only abnormal results are displayed) Labs Reviewed  URINALYSIS, ROUTINE W REFLEX MICROSCOPIC - Abnormal; Notable for the following components:      Result Value   Color, Urine AMBER (*)    Hgb urine dipstick LARGE (*)    Nitrite POSITIVE (*)    Bacteria, UA RARE (*)    All other components within normal limits  URINE CULTURE    EKG None  Radiology No results found.  Procedures Procedures    Medications Ordered in ED Medications  cefadroxil (DURICEF) capsule 500 mg (500 mg Oral Given 04/23/23 2323)    ED Course/ Medical Decision Making/ A&P                                 Medical Decision Making Amount and/or Complexity of Data Reviewed Labs: ordered.  Risk Prescription drug management.   This patient is a 72 y.o. male who presents to the ED for concern of urinary retention, this involves an extensive number of treatment options, and is a complaint that carries with it a high risk of complications and morbidity. The emergent differential diagnosis prior to evaluation includes,  but is not limited to,  UTI, obstruction, BPH . This is not an exhaustive differential.   Past Medical History / Co-morbidities / Social History:  has a past medical history of Anxiety, Aortic aneurysm (HCC), Arthritis, Barrett esophagus, Blood transfusion, Cancer (HCC) (2007), Cancer [facility]), Coronary artery  disease, GERD (gastroesophageal reflux disease), H/O hiatal hernia, Headache(784.0), Hypercholesteremia, Hypertension, PONV (postoperative nausea and vomiting), Shingles, Sleep apnea, and Tumor liver.  Additional history: Chart reviewed. Pertinent results include: Patient currently on oral chemotherapy for B-cell lymphoma followed by [facility].  Most recent WBC count showed no leukopenia.  Physical Exam: Physical exam performed. The pertinent findings include: Seen and evaluated after Foley catheter placement.  Upon my evaluation, patient's abdomen is soft and nontender.  Lab Tests: I ordered, and personally interpreted labs.  The pertinent results include:  UA with blood likely from foley placement. Positive nitrites, rare bacteria. Culture pending   Imaging Studies: Bladder scan showed 700 mls    Medications: I ordered medication including duracef for UTI. Reevaluation of the patient after these medicines showed that the patient stayed the same. I have reviewed the patients home medicines and have made adjustments as needed.   Disposition: After consideration of the diagnostic results and the patients response to treatment, I feel that emergency department workup does not suggest an emergent condition requiring admission or immediate intervention beyond what has been performed at this time. The plan is: Discharge with close urology follow-up and return precautions.  After Foley catheter placement, patient's symptoms have completely resolved and he is ready to go home.  He has a urology appointment at 2 PM tomorrow.  His UA did have positive nitrates, some concern for infection, culture is  pending.  Patient is on chemotherapy, therefore did consider further evaluation with labs, imaging, admission, IV antibiotics, etc., however patient has reassuring vital signs and notes recent blood work showed no signs of neutropenia.  His UA also does not have leukocytes or WBCs. Shared decision making, plan to treat with duracef and close outpatient follow-up and return precautions.  First dose of same given this evening.  Evaluation and diagnostic testing in the emergency department does not suggest an emergent condition requiring admission or immediate intervention beyond what has been performed at this time.  Plan for discharge with close PCP follow-up.  Patient is understanding and amenable with plan, educated on red flag symptoms that would prompt immediate return.  Patient discharged in stable condition.  Final Clinical Impression(s) / ED Diagnoses Final diagnoses:  Urinary retention  Acute cystitis without hematuria    Rx / DC Orders ED Discharge Orders          Ordered    cefadroxil (DURICEF) 500 MG capsule  2 times daily        04/23/23 2319          An After Visit Summary was printed and given to the patient.     Vear Clock 04/24/23 Winfield Rast, MD 04/24/23 1259

## 2023-04-23 NOTE — ED Provider Notes (Incomplete)
[place] EMERGENCY DEPARTMENT AT [facility] Provider Note   CSN: [id number] Arrival date & time: 04/23/23  2111     History {Add pertinent medical, surgical, social history, OB history to HPI:1} Chief Complaint  Patient presents with  . Urinary Retention    [name] is a 72 y.o. male.  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         Home Medications Prior to Admission medications   Medication Sig Start Date End Date Taking? Authorizing Provider  BRUKINSA 80 MG capsule Take 160 mg by mouth 2 (two) times daily.   Yes [provider]  Calcium Carbonate-Vitamin D 600-10 MG-MCG TABS Take 1 tablet by mouth 2 (two) times daily.    Yes [provider]  cefadroxil (DURICEF) 500 MG capsule Take 1 capsule (500 mg total) by mouth 2 (two) times daily for 7 days. 04/23/23 04/30/23 Yes Journey Castonguay A, PA-C  cyclobenzaprine (FLEXERIL) 10 MG tablet Take 10 mg by mouth in the morning and at bedtime.   Yes [provider]  dexlansoprazole (DEXILANT) 60 MG capsule Take 60 mg by mouth daily.    Yes [provider]  diazepam (VALIUM) 10 MG tablet Take 10 mg by mouth in the morning and at bedtime.   Yes [provider]  diltiazem (CARDIZEM) 30 MG tablet Take 30 mg by mouth 2 (two) times daily.   Yes [provider]  fexofenadine (ALLEGRA) 180 MG tablet Take 180 mg by mouth every evening.   Yes [provider]  fish oil-omega-3 fatty acids 1000 MG capsule Take 1 g by mouth 2 (two)  times daily.   Yes [provider]  hydrocortisone (CORTEF) 10 MG tablet Take 20mg  in am and 10 mg PM. Patient taking differently: Take 10-15 mg by mouth See admin instructions. Take 10 mg by mouth in the morning, hen take 5 mg (split 10 mg in half) by mouth in the evening per patient 02/28/22  Yes Vassie Loll, MD  lisinopril (ZESTRIL) 10 MG tablet Take 10 mg by mouth daily.   Yes [provider]  metoCLOPramide (REGLAN) 10 MG tablet Take 1 tablet by mouth in the morning and at bedtime. 08/03/20  Yes [provider]  mirabegron ER (MYRBETRIQ) 25 MG TB24 tablet Take 25 mg by mouth daily.   Yes [provider]  montelukast (SINGULAIR) 10 MG tablet Take 10 mg by mouth daily.    Yes [provider]  Multiple Vitamin (MULITIVITAMIN WITH MINERALS) TABS Take 1 tablet by mouth daily.   Yes [provider]  Probiotic Product (PROBIOTIC PO) Take 1 tablet by mouth daily.   Yes [provider]  ranolazine (RANEXA) 500 MG 12 hr tablet Take 500 mg by mouth 2 (two) times daily. 04/23/20  Yes [provider]  rivaroxaban (XARELTO) 10 MG TABS tablet Take 10 mg by mouth daily.   Yes [provider]  rosuvastatin (CRESTOR) 10 MG tablet Take 5 mg by mouth daily.   Yes [provider]  tamsulosin (FLOMAX) 0.4 MG CAPS capsule Take 0.4 mg by mouth daily after supper.   Yes [provider]  Allergies    Demerol    Review of Systems   Review of Systems  Physical Exam Updated Vital Signs BP 110/69   Pulse 76   Temp 97.9 F (36.6 C) (Oral)   Resp 18   Ht 5\' 11"  (1.803 m)   Wt 108 kg   SpO2 92%   BMI 33.19 kg/m  Physical Exam  ED Results / Procedures / Treatments   Labs (all labs ordered are listed, but only abnormal results are displayed) Labs Reviewed  URINALYSIS, ROUTINE W REFLEX MICROSCOPIC - Abnormal; Notable for the following components:      Result Value   Color, Urine AMBER (*)    Hgb urine  dipstick LARGE (*)    Nitrite POSITIVE (*)    Bacteria, UA RARE (*)    All other components within normal limits  URINE CULTURE    EKG None  Radiology No results found.  Procedures Procedures  {Document cardiac monitor, telemetry assessment procedure when appropriate:1}  Medications Ordered in ED Medications  cefadroxil (DURICEF) capsule 500 mg (has no administration in time range)    ED Course/ Medical Decision Making/ A&P   {   Click here for ABCD2, HEART and other calculatorsREFRESH Note before signing :1}                              Medical Decision Making Amount and/or Complexity of Data Reviewed Labs: ordered.  Risk Prescription drug management.   ***  {Document critical care time when appropriate:1} {Document review of labs and clinical decision tools ie heart score, Chads2Vasc2 etc:1}  {Document your independent review of radiology images, and any outside records:1} {Document your discussion with family members, caretakers, and with consultants:1} {Document social determinants of health affecting pt's care:1} {Document your decision making why or why not admission, treatments were needed:1} Final Clinical Impression(s) / ED Diagnoses Final diagnoses:  None    Rx / DC Orders ED Discharge Orders          Ordered    cefadroxil (DURICEF) 500 MG capsule  2 times daily        04/23/23 2319

## 2023-04-25 LAB — URINE CULTURE: Culture: NO GROWTH

## 2023-05-03 ENCOUNTER — Emergency Department (HOSPITAL_COMMUNITY)
Admission: EM | Admit: 2023-05-03 | Discharge: 2023-05-03 | Disposition: A | Discharge status: Home / Self Care | Payer: Medicare Other | Attending: Emergency Medicine | Admitting: Emergency Medicine

## 2023-05-03 ENCOUNTER — Encounter (HOSPITAL_COMMUNITY): Payer: Self-pay

## 2023-05-03 ENCOUNTER — Other Ambulatory Visit: Payer: Self-pay

## 2023-05-03 DIAGNOSIS — R339 Retention of urine, unspecified: Secondary | ICD-10-CM | POA: Insufficient documentation

## 2023-05-03 DIAGNOSIS — Z7901 Long term (current) use of anticoagulants: Secondary | ICD-10-CM | POA: Diagnosis not present

## 2023-05-03 DIAGNOSIS — R319 Hematuria, unspecified: Secondary | ICD-10-CM | POA: Insufficient documentation

## 2023-05-03 DIAGNOSIS — Z86711 Personal history of pulmonary embolism: Secondary | ICD-10-CM | POA: Diagnosis not present

## 2023-05-03 NOTE — ED Provider Notes (Signed)
[place] EMERGENCY DEPARTMENT AT [facility] Provider Note   CSN: [id number] Arrival date & time: 05/03/23  1232     History  Chief Complaint  Patient presents with   remove foley    [name] is a 72 y.o. male.  72 year old male with history of PE on Xarelto and urinary retention who presents to the emergency department with request to have his Foley catheter removed.  On 04/23/23 he came to the emergency department for urinary retention.  Foley catheter was placed.  Did have nitrite positive urine and was sent home on cefadroxil but urine culture was negative.  Since going home he has tried to schedule an appointment with urology to have his Foley catheter removed but was unable to see them due to the holiday and is requesting to have his Foley catheter removed at this time.  No fevers or chills.  No significant pain.  Has had occasional hematuria.       Home Medications Prior to Admission medications   Medication Sig Start Date End Date Taking? Authorizing Provider  BRUKINSA 80 MG capsule Take 160 mg by mouth 2 (two) times daily.    [provider]  Calcium Carbonate-Vitamin D 600-10 MG-MCG TABS Take 1 tablet by mouth 2 (two) times daily.     [provider]  cyclobenzaprine (FLEXERIL) 10 MG tablet Take 10 mg by mouth in the morning and at bedtime.    [provider]  dexlansoprazole (DEXILANT) 60 MG capsule Take 60 mg by mouth daily.     [provider]  diazepam (VALIUM) 10 MG tablet Take 10 mg by mouth in the morning and at bedtime.    [provider]  diltiazem (CARDIZEM) 30 MG tablet Take 30 mg by mouth 2 (two) times daily.    [provider]  fexofenadine (ALLEGRA) 180 MG tablet Take 180 mg by mouth every evening.    [provider]  fish oil-omega-3 fatty acids 1000 MG capsule Take 1 g by mouth 2 (two) times daily.    [provider]  hydrocortisone (CORTEF) 10 MG tablet Take 20mg  in  am and 10 mg PM. Patient taking differently: Take 10-15 mg by mouth See admin instructions. Take 10 mg by mouth in the morning, hen take 5 mg (split 10 mg in half) by mouth in the evening per patient 02/28/22   Vassie Loll, MD  lisinopril (ZESTRIL) 10 MG tablet Take 10 mg by mouth daily.    [provider]  metoCLOPramide (REGLAN) 10 MG tablet Take 1 tablet by mouth in the morning and at bedtime. 08/03/20   [provider]  mirabegron ER (MYRBETRIQ) 25 MG TB24 tablet Take 25 mg by mouth daily.    [provider]  montelukast (SINGULAIR) 10 MG tablet Take 10 mg by mouth daily.     [provider]  Multiple Vitamin (MULITIVITAMIN WITH MINERALS) TABS Take 1 tablet by mouth daily.    [provider]  Probiotic Product (PROBIOTIC PO) Take 1 tablet by mouth daily.    [provider]  ranolazine (RANEXA) 500 MG 12 hr tablet Take 500 mg by mouth 2 (two) times daily. 04/23/20   [provider]  rivaroxaban (XARELTO) 10 MG TABS tablet Take 10 mg by mouth daily.    [provider]  rosuvastatin (CRESTOR) 10 MG tablet Take 5 mg by mouth daily.    [provider]  tamsulosin (FLOMAX) 0.4 MG CAPS capsule Take 0.4 mg by  mouth daily after supper.    [provider]      Allergies    Demerol    Review of Systems   Review of Systems  Physical Exam Updated Vital Signs BP 133/71 (BP Location: Right Arm)   Pulse 84   Temp (!) 97.3 F (36.3 C)   Resp 16   Ht 5\' 11"  (1.803 m)   Wt 108 kg   SpO2 94%   BMI 33.19 kg/m  Physical Exam Abdominal:     General: There is no distension.     Palpations: There is no mass.     Tenderness: There is no abdominal tenderness. There is no guarding.  Genitourinary:    Comments: Foley catheter in place with straw-colored urine    ED Results / Procedures / Treatments   Labs (all labs ordered are listed, but only abnormal results are displayed) Labs Reviewed - No data to  display  EKG None  Radiology No results found.  Procedures Procedures    Medications Ordered in ED Medications - No data to display  ED Course/ Medical Decision Making/ A&P Clinical Course as of 05/03/23 1747  Sun May 03, 2023  1335 Patient voided spontaneously with a postvoid residual 14 mL [RP]    Clinical Course User Index [RP] Rondel Baton, MD                                 Medical Decision Making  [name] is a 72 y.o. male with comorbidities that complicate the patient evaluation including PE on Xarelto and urinary retention who presents to the emergency department with request to have his Foley catheter removed.   Initial Ddx:  Urinary retention, UTI, BPH  MDM/Course:  Patient presents emergency department with request to have his Foley catheter removed.  Did appear to have some urinary retention and was already treated for a UTI.  He is also on Flomax.  Foley catheter was removed in the emergency department and patient was able to void spontaneously.  Did not have any significant retention.  Upon re-evaluation patient is feeling comfortable and requesting to go home which I feel is appropriate.  Will have him follow-up with his primary doctor and urology.  This patient presents to the ED for concern of complaints listed in HPI, this involves an extensive number of treatment options, and is a complaint that carries with it a high risk of complications and morbidity. Disposition including potential need for admission considered.   Dispo: DC Home. Return precautions discussed including, but not limited to, those listed in the AVS. Allowed pt time to ask questions which were answered fully prior to dc.  Additional history obtained from spouse Records reviewed Outpatient Clinic Notes I have reviewed the patients home medications and made adjustments as needed Social Determinants of health:  Elderly  Portions of this note were generated with Administrator, sports. Dictation errors may occur despite best attempts at proofreading.           Final Clinical Impression(s) / ED Diagnoses Final diagnoses:  Urinary retention    Rx / DC Orders ED Discharge Orders     None         Rondel Baton, MD 05/03/23 1747

## 2023-05-03 NOTE — ED Triage Notes (Signed)
Pt erports he has a catheter for retention and urology was going to remove it this week but they did not have anyone to do it because of the holiday.  Pt is now leaking around the catheter so he would like [place] to remove it.

## 2023-05-03 NOTE — Discharge Instructions (Signed)
You were seen for your urinary retention in the emergency department.   At home, please continue to take your Flomax.    Check your MyChart online for the results of any tests that had not resulted by the time you left the emergency department.   Follow-up with your primary doctor in 2-3 days regarding your visit.  Follow-up with urology as scheduled.  Return immediately to the emergency department if you experience any of the following: Urinary retention, flank pain, fevers, burning when you pee, or any other concerning symptoms.    Thank you for visiting our Emergency Department. It was a pleasure taking care of you today.

## 2023-09-21 ENCOUNTER — Inpatient Hospital Stay: Payer: Medicare Other | Attending: Hematology

## 2023-09-21 DIAGNOSIS — R519 Headache, unspecified: Secondary | ICD-10-CM | POA: Insufficient documentation

## 2023-09-21 DIAGNOSIS — Z79899 Other long term (current) drug therapy: Secondary | ICD-10-CM | POA: Insufficient documentation

## 2023-09-21 DIAGNOSIS — Z807 Family history of other malignant neoplasms of lymphoid, hematopoietic and related tissues: Secondary | ICD-10-CM | POA: Diagnosis not present

## 2023-09-21 DIAGNOSIS — Z85820 Personal history of malignant melanoma of skin: Secondary | ICD-10-CM | POA: Diagnosis not present

## 2023-09-21 DIAGNOSIS — C7951 Secondary malignant neoplasm of bone: Secondary | ICD-10-CM | POA: Diagnosis not present

## 2023-09-21 DIAGNOSIS — Z808 Family history of malignant neoplasm of other organs or systems: Secondary | ICD-10-CM | POA: Diagnosis not present

## 2023-09-21 DIAGNOSIS — K5900 Constipation, unspecified: Secondary | ICD-10-CM | POA: Diagnosis not present

## 2023-09-21 DIAGNOSIS — R079 Chest pain, unspecified: Secondary | ICD-10-CM | POA: Diagnosis not present

## 2023-09-21 DIAGNOSIS — G479 Sleep disorder, unspecified: Secondary | ICD-10-CM | POA: Insufficient documentation

## 2023-09-21 DIAGNOSIS — Z8249 Family history of ischemic heart disease and other diseases of the circulatory system: Secondary | ICD-10-CM | POA: Insufficient documentation

## 2023-09-21 DIAGNOSIS — R0602 Shortness of breath: Secondary | ICD-10-CM | POA: Insufficient documentation

## 2023-09-21 DIAGNOSIS — C61 Malignant neoplasm of prostate: Secondary | ICD-10-CM | POA: Diagnosis present

## 2023-09-21 DIAGNOSIS — C8307 Small cell B-cell lymphoma, spleen: Secondary | ICD-10-CM | POA: Diagnosis present

## 2023-09-21 DIAGNOSIS — R21 Rash and other nonspecific skin eruption: Secondary | ICD-10-CM | POA: Diagnosis not present

## 2023-09-21 DIAGNOSIS — Z87891 Personal history of nicotine dependence: Secondary | ICD-10-CM | POA: Diagnosis not present

## 2023-09-21 DIAGNOSIS — Z8719 Personal history of other diseases of the digestive system: Secondary | ICD-10-CM | POA: Diagnosis not present

## 2023-09-21 DIAGNOSIS — D696 Thrombocytopenia, unspecified: Secondary | ICD-10-CM | POA: Insufficient documentation

## 2023-09-21 DIAGNOSIS — Z885 Allergy status to narcotic agent status: Secondary | ICD-10-CM | POA: Diagnosis not present

## 2023-09-21 DIAGNOSIS — Z7901 Long term (current) use of anticoagulants: Secondary | ICD-10-CM | POA: Diagnosis not present

## 2023-09-21 DIAGNOSIS — R42 Dizziness and giddiness: Secondary | ICD-10-CM | POA: Insufficient documentation

## 2023-09-21 DIAGNOSIS — D709 Neutropenia, unspecified: Secondary | ICD-10-CM

## 2023-09-21 DIAGNOSIS — R110 Nausea: Secondary | ICD-10-CM | POA: Diagnosis not present

## 2023-09-21 LAB — CBC WITH DIFFERENTIAL/PLATELET
Abs Immature Granulocytes: 0.02 10*3/uL (ref 0.00–0.07)
Basophils Absolute: 0 10*3/uL (ref 0.0–0.1)
Basophils Relative: 0 %
Eosinophils Absolute: 0 10*3/uL (ref 0.0–0.5)
Eosinophils Relative: 0 %
HCT: 39.4 % (ref 39.0–52.0)
Hemoglobin: 13 g/dL (ref 13.0–17.0)
Immature Granulocytes: 1 %
Lymphocytes Relative: 28 %
Lymphs Abs: 0.7 10*3/uL (ref 0.7–4.0)
MCH: 31 pg (ref 26.0–34.0)
MCHC: 33 g/dL (ref 30.0–36.0)
MCV: 93.8 fL (ref 80.0–100.0)
Monocytes Absolute: 0.2 10*3/uL (ref 0.1–1.0)
Monocytes Relative: 9 %
Neutro Abs: 1.6 10*3/uL — ABNORMAL LOW (ref 1.7–7.7)
Neutrophils Relative %: 62 %
Platelets: 56 10*3/uL — ABNORMAL LOW (ref 150–400)
RBC: 4.2 MIL/uL — ABNORMAL LOW (ref 4.22–5.81)
RDW: 14.4 % (ref 11.5–15.5)
Smear Review: DECREASED
WBC: 2.6 10*3/uL — ABNORMAL LOW (ref 4.0–10.5)
nRBC: 0 % (ref 0.0–0.2)

## 2023-09-21 LAB — COMPREHENSIVE METABOLIC PANEL WITH GFR
ALT: 18 U/L (ref 0–44)
AST: 20 U/L (ref 15–41)
Albumin: 4.2 g/dL (ref 3.5–5.0)
Alkaline Phosphatase: 87 U/L (ref 38–126)
Anion gap: 9 (ref 5–15)
BUN: 27 mg/dL — ABNORMAL HIGH (ref 8–23)
CO2: 25 mmol/L (ref 22–32)
Calcium: 9.5 mg/dL (ref 8.9–10.3)
Chloride: 105 mmol/L (ref 98–111)
Creatinine, Ser: 1.21 mg/dL (ref 0.61–1.24)
GFR, Estimated: >60 mL/min (ref >60)
Glucose, Bld: 99 mg/dL (ref 70–99)
Potassium: 4.4 mmol/L (ref 3.5–5.1)
Sodium: 139 mmol/L (ref 135–145)
Total Bilirubin: 1.2 mg/dL (ref 0.0–1.2)
Total Protein: 6.9 g/dL (ref 6.5–8.1)

## 2023-09-21 LAB — LACTATE DEHYDROGENASE: LDH: 105 U/L (ref 98–192)

## 2023-09-28 ENCOUNTER — Inpatient Hospital Stay (HOSPITAL_BASED_OUTPATIENT_CLINIC_OR_DEPARTMENT_OTHER): Payer: Medicare Other | Admitting: Hematology

## 2023-09-28 VITALS — BP 133/79 | HR 57 | Temp 97.9°F | Resp 16 | Wt 239.9 lb

## 2023-09-28 DIAGNOSIS — D696 Thrombocytopenia, unspecified: Secondary | ICD-10-CM

## 2023-09-28 DIAGNOSIS — C7982 Secondary malignant neoplasm of genital organs: Secondary | ICD-10-CM | POA: Diagnosis not present

## 2023-09-28 DIAGNOSIS — C8307 Small cell B-cell lymphoma, spleen: Secondary | ICD-10-CM

## 2023-09-28 NOTE — Patient Instructions (Addendum)
 [facility] at [facility] Discharge Instructions   You were seen and examined today by Dr. Cheree Cords.  He reviewed the results of your lab work which are normal/stable. Your platelets are 56 today.   Continue Brukinsa as prescribed.   We will see you back in 6 months. We will repeat lab work prior to your next visit.   Return as scheduled.    Thank you for choosing [facility] at [facility] to provide your oncology and hematology care.  To afford each patient quality time with our provider, please arrive at least 15 minutes before your scheduled appointment time.   If you have a lab appointment with the Cancer Center please come in thru the Main Entrance and check in at the main information desk.  You need to re-schedule your appointment should you arrive 10 or more minutes late.  We strive to give you quality time with our providers, and arriving late affects you and other patients whose appointments are after yours.  Also, if you no show three or more times for appointments you may be dismissed from the clinic at the providers discretion.     Again, thank you for choosing [facility].  Our hope is that these requests will decrease the amount of time that you wait before being seen by our physicians.       _____________________________________________________________  Should you have questions after your visit to [facility], please contact our office at [phone] and follow the prompts.  Our office hours are 8:00 a.m. and 4:30 p.m. Monday - Friday.  Please note that voicemails left after 4:00 p.m. may not be returned until the following business day.  We are closed weekends and major holidays.  You do have access to a nurse 24-7, just call the main number to the clinic [phone] and do not press any options, hold on the line and a nurse will answer the phone.    For prescription refill requests, have your  pharmacy contact our office and allow 72 hours.    Due to Covid, you will need to wear a mask upon entering the hospital. If you do not have a mask, a mask will be given to you at the Main Entrance upon arrival. For doctor visits, patients may have 1 support person age 43 or older with them. For treatment visits, patients can not have anyone with them due to social distancing guidelines and our immunocompromised population.

## 2023-09-28 NOTE — Progress Notes (Signed)
Patient is taking Brukinsa as prescribed.  He has not missed any doses and reports no side effects at this time.   

## 2023-09-28 NOTE — Progress Notes (Signed)
 [facility] [address], [place] [address]    Clinic Day:  09/28/2023  Referring physician: Kathyleen Parkins, MD  Patient Care Team: Kathyleen Parkins, MD as PCP - General (Internal Medicine) Wendie Hamburg, MD as PCP - Cardiology (Cardiology) Paulett Boros, MD as Consulting Physician (Hematology)   ASSESSMENT & PLAN:   Assessment: 1.  Moderate thrombocytopenia: -Seen as referral from Dr. Alvin Axe office for decreased platelet count of 80 on CBC on 10/21/2018. -Ex-smoker, quit in 1982, smoked 1 to 2 packs/day for 10 years.  He also spent 2 years at [facility]. -CT abdomen on 11/08/2018 showed normal size spleen with no abdominal or pelvic pathology. -BMBX on 03/01/2019 showed slightly hypercellular marrow for age with trilineage hematopoiesis including abundant megakaryocytes and predominantly normal morphology.  Overall myeloid changes are nonspecific and not diagnostic for MDS.  Chromosome analysis was normal.  MDS FISH panel was normal.  Core biopsy showed small lymphoid aggregates, features are not considered specific or diagnostic of lymphoproliferative disorder. -He was treated with pulsed dexamethasone  around the 01/27/2020 with improvement of platelet count to 70 K.  He could not tolerate dexamethasone  due to sleeplessness. -He was also treated with prednisone  60 mg daily on 05/22/2020.  He took it only for 2 to 3 days and stopped it due to inability to sleep. -Treated with immunoglobulin from 05/14/2020 through 05/18/2020. -PET scan on 06/05/2020 view shows no hypermetabolic lymph nodes in the chest, abdomen or pelvis.  Splenomegaly measuring 18 cm with normal metabolic activity. -Bone marrow biopsy on 06/26/2020 with mildly hypercellular marrow, no significant dysplasia.  Scattered large lymphoid aggregates with a predominance of B cells.  Flow cytometry was nondiagnostic.  There is no expression of CD5 or CD10 on the B cells. 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  However no clonality is demonstrated in the kappa light chain gene (IGK) -Myeloma FISH panel was negative.  Cytogenetics was 89, XY. -He was evaluated as second opinion by Dr. 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 - Received 4 cycles of rituximab from 09/05/2020 through 10/03/2020. - BMBx (April 2024): This showed about 15% lymphoma cells and possible megakaryocytic dysplasia.  Myeloid NGS panel showed TET2 mutation. - CT CAP (09/09/2022): At [facility] showed no lymphadenopathy.  Splenomegaly slightly increased from prior measuring 17 cm. - Zanubrutinib 160 mg twice daily started on 12/17/2022   2.  Malignant melanoma: -Melanoma resected on the left side of the abdomen with left axillary sentinel lymph node biopsy at [facility] 14 years ago.  Reportedly stage III.    Plan: 1.  Splenic marginal zone lymphoma: - Brukinsa 160 mg twice daily started on 12/17/2022. - He is tolerating it well.  Denies any infections or B symptoms since last visit. - CT CAP from 07/08/2023: Spleen improved to 15 cm, previously 18 cm. - Dr. Harriet Limber has decreased his Brukinsa to 160 mg once daily as he was diagnosed with metastatic prostate cancer. - RTC 6 months for follow-up.  2.  Unprovoked pulmonary embolism (diagnosis 05/05/2021): - Continue Xarelto  10 mg daily.  He has easy bruising on the forearms.  No significant active bleeding.  3.  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 - Bone scan (07/08/2023): Increased uptake in the right proximal humerus,  right iliac, left ischium, left inferior sacroiliac joint region and right mandible. - Lupron started on 07/21/2023, PSA 18.69 - Nubeqa started in the  first week of March 2025 - Last PSA on 09/07/2023 improved to 0.56. - Continue Nubeqa 600 mg twice daily. - He developed erythematous macular rash/plaque on the forehead close to scalp involving the anterior third of the scalp.  He was started on prednisone  taper on 09/26/2023 with mild improvement.  He denies any itching/pain.  4.  Bone metastasis: - We have discussed role of bisphosphonates to decrease skeletal related events in the setting of metastatic prostate cancer.  He will talk to Dr. Phyllis Breeze at [facility]    Orders Placed This Encounter  Procedures   CBC with Differential    Standing Status:   Future    Expected Date:   03/28/2024    Expiration Date:   09/27/2024   Comprehensive metabolic panel    Standing Status:   Future    Expected Date:   03/28/2024    Expiration Date:   09/27/2024   Lactate dehydrogenase    Standing Status:   Future    Expected Date:   03/28/2024    Expiration Date:   09/27/2024   PSA    Standing Status:   Future    Expected Date:   03/28/2024    Expiration Date:   09/27/2024     Hurman Maiden R Teague,acting as a scribe for Paulett Boros, MD.,have documented all relevant documentation on the behalf of Paulett Boros, MD,as directed by  Paulett Boros, MD while in the presence of Paulett Boros, MD.  I, Paulett Boros MD, have reviewed the above documentation for accuracy and completeness, and I agree with the above.   Paulett Boros, MD   4/28/20253:43 PM  CHIEF COMPLAINT:   Diagnosis: splenic marginal zone lymphoma    Cancer Staging  No matching staging information was found for the patient.    Prior Therapy: 1. pulsed dexamethasone  around the 01/27/2020  2. Prednisone  60 mg in 05/2020  3. immunoglobulin from 05/14/2020 through 05/18/2020 4. Rituximab, 4 cycles, 09/05/2020 - 10/03/2020   Current Therapy:  surveillance   HISTORY OF PRESENT ILLNESS:   Oncology History   No history exists.     INTERVAL HISTORY:    [name] is a 73 y.o. male seen for follow-up of splenic marginal zone lymphoma.  He was last seen by me on 03/30/23.   Since his last visit, he underwent right mandibular biopsy on 06/23/23 after imaging on 06/02/23 showed abnormal findings on the right mandible. Pathology of the biopsy at the sit of tooth 31 revealed: metastatic carcinoma consistent with prostate primary. Tumor cells were diffusely positive for PSA and NKX3.1 with focal staining for CK7 and CD117. He then underwent NM while body scan on 07/08/23 that found: Scintigraphic findings concerning for osseous metastatic disease with increased radiotracer uptake at the right proximal humerus, right iliac, left ischium, left inferior sacroiliac joint region, and right mandible.  He had TRUS prostate biopsy on 07/20/23, which revealed: adenocarcinoma with ductal features in 11/12 cores and Gleason 4+4+=8. Bren was started on ADT + Darolutamide in March 2025 by his oncologist at [facility]. His most recent PSA from 09/07/23 was 0.56.   His CT CAP from 07/08/23 was stable regarding splenomegaly.   Today, he states that he is doing well overall. His appetite level is at 100%. His energy level is at 30%.   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   Coronary artery disease    mild by 09/08/13 cath [facility]   GERD (gastroesophageal reflux disease)    barretts esophagus   H/O hiatal hernia    Headache(784.0)    Hypercholesteremia    Hypertension    PONV (postoperative nausea and vomiting)    trouble breathing after last surgery (at [facility])   Shingles    10   Sleep apnea    not tested   Tumor liver    no problems    Surgical History: Past Surgical History:  Procedure  Laterality Date   ABDOMINAL SURGERY     x3   back surgeries     x 9   BACK SURGERY     x8   BIOPSY N/A 03/28/2013   Procedure: Distal Esophageal Biopsy;  Surgeon: Ruby Corporal, MD;  Location: [facility];  Service: Endoscopy;  Laterality: N/A;   BIOPSY  05/30/2016   Procedure: BIOPSY;  Surgeon: Ruby Corporal, MD;  Location: [facility];  Service: Endoscopy;;   BIOPSY  02/19/2018   Procedure: BIOPSY;  Surgeon: Ruby Corporal, MD;  Location: [facility];  Service: Endoscopy;;  Barret's esophagus   BIOPSY  08/29/2020   Procedure: BIOPSY;  Surgeon: Ruby Corporal, MD;  [facility]: [facility];  Service: Endoscopy;;   BIOPSY  12/31/2021   Procedure: BIOPSY;  Surgeon: Urban Garden, MD;  Location: [facility];  Service: Gastroenterology;;   CARDIAC CATHETERIZATION  2020   CHEST SURGERY     lung incisional hernia for hernia   COLONOSCOPY WITH PROPOFOL  N/A 02/19/2018   Procedure: COLONOSCOPY WITH PROPOFOL ;  Surgeon: Ruby Corporal, MD;  Location: [facility];  Service: Endoscopy;  Laterality: N/A;  1:00   CORONARY ANGIOPLASTY WITH STENT PLACEMENT  2020   2 blockages: 1 stent   ESOPHAGOGASTRODUODENOSCOPY (EGD) WITH PROPOFOL  N/A 03/28/2013   Procedure: ESOPHAGOGASTRODUODENOSCOPY (EGD) WITH PROPOFOL ;  Surgeon: Ruby Corporal, MD;  Location: [facility];  Service: Endoscopy;  Laterality: N/A;  GE junction @ 38, proximal  margin @37    ESOPHAGOGASTRODUODENOSCOPY (EGD) WITH PROPOFOL  N/A 05/30/2016   Procedure: ESOPHAGOGASTRODUODENOSCOPY (EGD) WITH PROPOFOL ;  Surgeon: Ruby Corporal, MD;  [facility];  Service: Endoscopy;  Laterality: N/A;  955   ESOPHAGOGASTRODUODENOSCOPY (EGD) WITH PROPOFOL  N/A 02/19/2018   Procedure: ESOPHAGOGASTRODUODENOSCOPY (EGD) WITH PROPOFOL ;  Surgeon: Ruby Corporal, MD;  [facility];  Service: Endoscopy;  Laterality: N/A;   ESOPHAGOGASTRODUODENOSCOPY (EGD) WITH PROPOFOL  N/A 08/29/2020   Procedure: ESOPHAGOGASTRODUODENOSCOPY (EGD)  WITH PROPOFOL ;  Surgeon: Ruby Corporal, MD;  [facility];  Service: Endoscopy;  Laterality: N/A;  AM   ESOPHAGOGASTRODUODENOSCOPY (EGD) WITH PROPOFOL  N/A 12/31/2021   Procedure: ESOPHAGOGASTRODUODENOSCOPY (EGD) WITH PROPOFOL ;  Surgeon: Urban Garden, MD;  [facility]: [facility];  Service: Gastroenterology;  Laterality: N/A;  100 ASA 2   HEMORRHOID SURGERY     x2   HERNIA REPAIR     HIATAL HERNIA REPAIR     JOINT REPLACEMENT     lft knee   KNEE ARTHROPLASTY     lft   KNEE ARTHROSCOPY Right    LEG SURGERY  x4 crushed    LITHOTRIPSY     MELANOMA EXCISION     lft abdomen   NECK SURGERY  2012   POLYPECTOMY  02/19/2018   Procedure: POLYPECTOMY;  Surgeon: Ruby Corporal, MD;  Location: [facility];  Service: Endoscopy;;  proximal transverse colon (CB x1), descending colon (CBx3)   POSTERIOR CERVICAL FUSION/FORAMINOTOMY  08/18/2011   Procedure: POSTERIOR CERVICAL FUSION/FORAMINOTOMY LEVEL 3;  Surgeon: Elder Greening, MD;  Location: [facility];  Service: Neurosurgery;  Laterality: N/A;  Cervical three-four, four-five, five-six posterior cervical  fusion with instrumentation; Right cervical three-four, four-five laminotomy   REVISION TOTAL HIP ARTHROPLASTY Left    SHOULDER ARTHROSCOPY     rt bith defect , spur, rotator cuff   SPINAL CORD STIMULATOR IMPLANT     rt hip   SPINAL CORD STIMULATOR INSERTION N/A 11/03/2016   Procedure: LUMBAR SPINAL CORD STIMULATOR REVISION;  Surgeon: Garry Kansas, MD;  Location: [facility];  Service: Neurosurgery;  Laterality: N/A;   THYROID  LOBECTOMY     rt    Social History: Social History   Socioeconomic History   Marital status: Married    Spouse name: Not on file   Number of children: Not on file   Years of education: Not on file   Highest education level: Not on file  Occupational History   Occupation: retired  Tobacco Use   Smoking status: Former    Current packs/day: 0.00    Average packs/day: 4.0 packs/day for  10.0 years (40.0 ttl pk-yrs)    Types: Cigarettes    Start date: 08/13/1970    Quit date: 08/12/1980    Years since quitting: 43.1   Smokeless tobacco: Never   Tobacco comments:    occ wine  Vaping Use   Vaping status: Never Used  Substance and Sexual Activity   Alcohol use: Not Currently   Drug use: No   Sexual activity: Yes    Birth control/protection: None  Other Topics Concern   Not on file  Social History Narrative   Not on file   Social Drivers of Health   Financial Resource Strain: Low Risk  (05/09/2020)   Overall Financial Resource Strain (CARDIA)    Difficulty of Paying Living Expenses: Not hard at all  Food Insecurity: No Food Insecurity (07/09/2023)   Received from [facility]   Hunger Vital Sign    Worried About Running Out of Food in the Last Year: Never true    Ran Out of Food in the Last Year: Never true  Transportation Needs: No Transportation Needs (01/08/2023)   PRAPARE - Administrator, Civil Service (Medical): No    Lack of Transportation (Non-Medical): No  Physical Activity: Inactive (05/09/2020)   Exercise Vital Sign    Days of Exercise per Week: 0 days    Minutes of Exercise per Session: 0 min  Stress: No Stress Concern Present (05/09/2020)   [employer] of Occupational Health - Occupational Stress Questionnaire    Feeling of Stress : Not at all  Social Connections: Unknown (10/15/2021)   Received from [facility]   Social Network    Social Network: Not on file  Intimate Partner Violence: Not At Risk (01/08/2023)   Humiliation, Afraid, Rape, and Kick questionnaire    Fear of Current or Ex-Partner: No    Emotionally Abused: No    Physically Abused: No    Sexually Abused: No    Family History: Family History  Problem Relation  Age of Onset   Heart disease Father    Aneurysm Mother    Aneurysm Sister    Heart disease Paternal Grandmother    Heart disease Paternal Grandfather    Heart disease  Maternal Grandfather    Heart disease Brother        heart transplant   Aneurysm Other        pt states several members have aortic aneurysm   Skin cancer Other        several members   Lymphoma Brother    Aneurysm Maternal Grandmother     Current Medications:  Current Outpatient Medications:    BRUKINSA 80 MG capsule, Take 160 mg by mouth 2 (two) times daily., Disp: , Rfl:    Calcium  Carbonate-Vitamin D  600-10 MG-MCG TABS, Take 1 tablet by mouth 2 (two) times daily. , Disp: , Rfl:    cyclobenzaprine  (FLEXERIL ) 10 MG tablet, Take 10 mg by mouth in the morning and at bedtime., Disp: , Rfl:    dexlansoprazole (DEXILANT) 60 MG capsule, Take 60 mg by mouth daily. , Disp: , Rfl:    diazepam  (VALIUM ) 10 MG tablet, Take 10 mg by mouth in the morning and at bedtime., Disp: , Rfl:    diltiazem  (CARDIZEM ) 30 MG tablet, Take 30 mg by mouth 2 (two) times daily., Disp: , Rfl:    fexofenadine (ALLEGRA) 180 MG tablet, Take 180 mg by mouth every evening., Disp: , Rfl:    fish oil-omega-3 fatty acids  1000 MG capsule, Take 1 g by mouth 2 (two) times daily., Disp: , Rfl:    hydrocortisone  (CORTEF ) 10 MG tablet, Take 20mg  in am and 10 mg PM. (Patient taking differently: Take 10-15 mg by mouth See admin instructions. Take 10 mg by mouth in the morning, hen take 5 mg (split 10 mg in half) by mouth in the evening per patient), Disp: 90 tablet, Rfl: 2   Lactobacillus (ACIDOPHILUS) TABS, Take by mouth., Disp: , Rfl:    lisinopril  (ZESTRIL ) 10 MG tablet, Take 10 mg by mouth daily., Disp: , Rfl:    metoCLOPramide  (REGLAN ) 10 MG tablet, Take 1 tablet by mouth in the morning and at bedtime., Disp: , Rfl:    mirabegron ER (MYRBETRIQ) 25 MG TB24 tablet, Take 25 mg by mouth daily., Disp: , Rfl:    montelukast  (SINGULAIR ) 10 MG tablet, Take 10 mg by mouth daily. , Disp: , Rfl:    Multiple Vitamin (MULITIVITAMIN WITH MINERALS) TABS, Take 1 tablet by mouth daily., Disp: , Rfl:    NUBEQA 300 MG tablet, Take 600 mg by mouth  2 (two) times daily., Disp: , Rfl:    ondansetron  (ZOFRAN ) 8 MG tablet, Take 8 mg by mouth as needed for nausea or vomiting., Disp: , Rfl:    oxyCODONE -acetaminophen  (PERCOCET) 10-325 MG tablet, Take by mouth., Disp: , Rfl:    Probiotic Product (PROBIOTIC PO), Take 1 tablet by mouth daily., Disp: , Rfl:    ranolazine  (RANEXA ) 500 MG 12 hr tablet, Take 500 mg by mouth 2 (two) times daily., Disp: , Rfl:    rivaroxaban  (XARELTO ) 20 MG TABS tablet, Take 10 mg by mouth daily., Disp: , Rfl:    rosuvastatin  (CRESTOR ) 10 MG tablet, Take 5 mg by mouth daily., Disp: , Rfl:    tamsulosin (FLOMAX) 0.4 MG CAPS capsule, Take 0.4 mg by mouth daily after supper., Disp: , Rfl:    Allergies: Allergies  Allergen Reactions   Demerol Other (See Comments)    "Makes me crazy."  REVIEW OF SYSTEMS:   Review of Systems  Constitutional:  Negative for chills, fatigue and fever.  HENT:   Negative for lump/mass, mouth sores, nosebleeds, sore throat and trouble swallowing.   Eyes:  Negative for eye problems.  Respiratory:  Positive for shortness of breath. Negative for cough.   Cardiovascular:  Positive for chest pain. Negative for leg swelling and palpitations.  Gastrointestinal:  Positive for constipation and nausea. Negative for abdominal pain, diarrhea and vomiting.  Genitourinary:  Negative for bladder incontinence, difficulty urinating, dysuria, frequency, hematuria and nocturia.   Musculoskeletal:  Negative for arthralgias, back pain, flank pain, myalgias and neck pain.  Skin:  Negative for itching and rash.  Neurological:  Positive for dizziness and headaches. Negative for numbness.  Hematological:  Does not bruise/bleed easily.  Psychiatric/Behavioral:  Positive for sleep disturbance. Negative for depression and suicidal ideas. The patient is not nervous/anxious.   All other systems reviewed and are negative.    VITALS:   Blood pressure 133/79, pulse (!) 57, temperature 97.9 F (36.6 C), temperature  source Oral, resp. rate 16, weight 239 lb 13.8 oz (108.8 kg), SpO2 97%.  Wt Readings from Last 3 Encounters:  09/28/23 239 lb 13.8 oz (108.8 kg)  05/03/23 238 lb (108 kg)  04/23/23 238 lb (108 kg)    Body mass index is 33.45 kg/m.  Performance status (ECOG): 1 - Symptomatic but completely ambulatory  PHYSICAL EXAM:   Physical Exam Vitals and nursing note reviewed. Exam conducted with a chaperone present.  Constitutional:      Appearance: Normal appearance.  Cardiovascular:     Rate and Rhythm: Normal rate and regular rhythm.     Pulses: Normal pulses.     Heart sounds: Normal heart sounds.  Pulmonary:     Effort: Pulmonary effort is normal.     Breath sounds: Normal breath sounds.  Abdominal:     Palpations: Abdomen is soft. There is no hepatomegaly, splenomegaly or mass.     Tenderness: There is no abdominal tenderness.  Musculoskeletal:     Right lower leg: No edema.     Left lower leg: No edema.  Lymphadenopathy:     Cervical: No cervical adenopathy.     Right cervical: No superficial, deep or posterior cervical adenopathy.    Left cervical: No superficial, deep or posterior cervical adenopathy.     Upper Body:     Right upper body: No supraclavicular or axillary adenopathy.     Left upper body: No supraclavicular or axillary adenopathy.  Skin:    Findings: Rash present.  Neurological:     General: No focal deficit present.     Mental Status: He is alert and oriented to person, place, and time.  Psychiatric:        Mood and Affect: Mood normal.        Behavior: Behavior normal.     LABS:      Latest Ref Rng & Units 09/21/2023    2:24 PM 03/23/2023   12:03 PM 01/27/2023   11:33 AM  CBC  WBC 4.0 - 10.5 K/uL 2.6  3.2  3.1   Hemoglobin 13.0 - 17.0 g/dL 40.9  81.1  91.4   Hematocrit 39.0 - 52.0 % 39.4  40.4  42.6   Platelets 150 - 400 K/uL 56  54  62       Latest Ref Rng & Units 09/21/2023    2:24 PM 03/23/2023   12:03 PM 01/27/2023   11:33 AM  CMP  Glucose 70 - 99 mg/dL 99  97  119   BUN 8 - 23 mg/dL 27  18  20    Creatinine 0.61 - 1.24 mg/dL 1.47  8.29  5.62   Sodium 135 - 145 mmol/L 139  136  138   Potassium 3.5 - 5.1 mmol/L 4.4  4.1  4.0   Chloride 98 - 111 mmol/L 105  105  104   CO2 22 - 32 mmol/L 25  25  23    Calcium  8.9 - 10.3 mg/dL 9.5  8.5  8.8   Total Protein 6.5 - 8.1 g/dL 6.9  6.5  6.9   Total Bilirubin 0.0 - 1.2 mg/dL 1.2  0.8  1.0   Alkaline Phos 38 - 126 U/L 87  41  49   AST 15 - 41 U/L 20  18  19    ALT 0 - 44 U/L 18  16  15       No results found for: "CEA1", "CEA" / No results found for: "CEA1", "CEA" No results found for: "PSA1" No results found for: "ZHY865" No results found for: "CAN125"  Lab Results  Component Value Date   TOTALPROTELP 6.9 06/09/2019   ALBUMINELP 4.4 06/09/2019   A1GS 0.2 06/09/2019   A2GS 0.5 06/09/2019   BETS 0.8 06/09/2019   GAMS 1.0 06/09/2019   MSPIKE Not Observed 06/09/2019   SPEI Comment 06/09/2019   Lab Results  Component Value Date   TIBC 105 (L) 02/06/2022   FERRITIN 451 (H) 02/06/2022   IRONPCTSAT 18 02/06/2022   Lab Results  Component Value Date   LDH 105 09/21/2023   LDH 95 (L) 03/23/2023   LDH 113 01/27/2023     STUDIES:   No results found.

## 2023-11-09 ENCOUNTER — Ambulatory Visit (HOSPITAL_COMMUNITY)
Admission: RE | Admit: 2023-11-09 | Discharge: 2023-11-09 | Disposition: A | Discharge status: Home / Self Care | Source: Ambulatory Visit | Attending: Internal Medicine | Admitting: Internal Medicine

## 2023-11-09 ENCOUNTER — Other Ambulatory Visit (HOSPITAL_COMMUNITY): Payer: Self-pay | Admitting: Internal Medicine

## 2023-11-09 DIAGNOSIS — R059 Cough, unspecified: Secondary | ICD-10-CM | POA: Diagnosis present

## 2023-11-30 ENCOUNTER — Ambulatory Visit (HOSPITAL_COMMUNITY)
Admission: RE | Admit: 2023-11-30 | Discharge: 2023-11-30 | Disposition: A | Discharge status: Home / Self Care | Source: Ambulatory Visit | Attending: Internal Medicine | Admitting: Internal Medicine

## 2023-11-30 ENCOUNTER — Other Ambulatory Visit (HOSPITAL_COMMUNITY): Payer: Self-pay | Admitting: Internal Medicine

## 2023-11-30 DIAGNOSIS — M79604 Pain in right leg: Secondary | ICD-10-CM

## 2023-12-09 ENCOUNTER — Other Ambulatory Visit (HOSPITAL_COMMUNITY): Payer: Self-pay | Admitting: Internal Medicine

## 2023-12-09 DIAGNOSIS — R42 Dizziness and giddiness: Secondary | ICD-10-CM

## 2023-12-11 ENCOUNTER — Ambulatory Visit (HOSPITAL_COMMUNITY)
Admission: RE | Admit: 2023-12-11 | Discharge: 2023-12-11 | Disposition: A | Discharge status: Home / Self Care | Source: Ambulatory Visit | Attending: Internal Medicine | Admitting: Internal Medicine

## 2023-12-11 DIAGNOSIS — R42 Dizziness and giddiness: Secondary | ICD-10-CM | POA: Insufficient documentation

## 2024-02-15 NOTE — Therapy (Unsigned)
 OUTPATIENT PHYSICAL THERAPY LOWER EXTREMITY EVALUATION   Patient Name: [name] MRN: [id number] DOB:[birth year], 73 y.o., male Today's Date: 02/17/2024  END OF SESSION:  PT End of Session - 02/16/24 1147     Visit Number 1    Number of Visits 16    Date for PT Re-Evaluation 03/15/24    Authorization Type Medicare Part A and B    Authorization Time Period no auth    Progress Note Due on Visit 10    PT Start Time 1148    PT Stop Time 1230    PT Time Calculation (min) 42 min    Activity Tolerance Patient tolerated treatment well;Patient limited by fatigue    Behavior During Therapy WFL for tasks assessed/performed          Past Medical History:  Diagnosis Date   Anxiety    patient denies   Aortic aneurysm (HCC)    ascending   Arthritis    Barrett esophagus    Blood transfusion    Cancer (HCC) 2007   melanoma stage 3 stomach bx   Cancer (HCC)    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   Coronary artery disease    mild by 09/08/13 cath [facility]   GERD (gastroesophageal reflux disease)    barretts esophagus   H/O hiatal hernia    Headache(784.0)    Hypercholesteremia    Hypertension    PONV (postoperative nausea and vomiting)    trouble breathing after last surgery (at [facility])   Shingles    10   Sleep apnea    not tested   Tumor liver    no problems   Past Surgical History:  Procedure Laterality Date   ABDOMINAL SURGERY     x3   back surgeries     x 9   BACK SURGERY     x8   BIOPSY N/A 03/28/2013   Procedure: Distal Esophageal Biopsy;  Surgeon: Claudis RAYMOND Rivet, MD;  Location: [facility];  Service: Endoscopy;  Laterality: N/A;   BIOPSY  05/30/2016   Procedure: BIOPSY;  Surgeon: Claudis RAYMOND Rivet, MD;  [facility]: [facility];  Service: Endoscopy;;   BIOPSY  02/19/2018   Procedure: BIOPSY;  Surgeon: Rivet Claudis RAYMOND, MD;  [facility]: [facility];  Service: Endoscopy;;  Barret's esophagus   BIOPSY  08/29/2020   Procedure: BIOPSY;  Surgeon: Rivet Claudis RAYMOND, MD;   [facility]: [facility];  Service: Endoscopy;;   BIOPSY  12/31/2021   Procedure: BIOPSY;  Surgeon: Eartha Angelia Sieving, MD;  [facility]: [facility];  Service: Gastroenterology;;   CARDIAC CATHETERIZATION  2020   CHEST SURGERY     lung incisional hernia for hernia   COLONOSCOPY WITH PROPOFOL  N/A 02/19/2018   Procedure: COLONOSCOPY WITH PROPOFOL ;  Surgeon: Rivet Claudis RAYMOND, MD;  [facility];  Service: Endoscopy;  Laterality: N/A;  1:00   CORONARY ANGIOPLASTY WITH STENT PLACEMENT  2020   2 blockages: 1 stent   ESOPHAGOGASTRODUODENOSCOPY (EGD) WITH PROPOFOL  N/A 03/28/2013   Procedure: ESOPHAGOGASTRODUODENOSCOPY (EGD) WITH PROPOFOL ;  Surgeon: Claudis RAYMOND Rivet, MD;  Location: [facility];  Service: Endoscopy;  Laterality: N/A;  GE junction @ 38, proximal  margin @37    ESOPHAGOGASTRODUODENOSCOPY (EGD) WITH PROPOFOL  N/A 05/30/2016   Procedure: ESOPHAGOGASTRODUODENOSCOPY (EGD) WITH PROPOFOL ;  Surgeon: Claudis RAYMOND Rivet, MD;  [facility];  Service: Endoscopy;  Laterality: N/A;  955   ESOPHAGOGASTRODUODENOSCOPY (EGD) WITH PROPOFOL  N/A 02/19/2018   Procedure: ESOPHAGOGASTRODUODENOSCOPY (EGD) WITH PROPOFOL ;  Surgeon: Golda Claudis PENNER, MD;  [facility];  Service: Endoscopy;  Laterality: N/A;   ESOPHAGOGASTRODUODENOSCOPY (EGD) WITH PROPOFOL  N/A 08/29/2020   Procedure: ESOPHAGOGASTRODUODENOSCOPY (EGD) WITH PROPOFOL ;  Surgeon: Golda Claudis PENNER, MD;  [facility];  Service: Endoscopy;  Laterality: N/A;  AM   ESOPHAGOGASTRODUODENOSCOPY (EGD) WITH PROPOFOL  N/A 12/31/2021   Procedure: ESOPHAGOGASTRODUODENOSCOPY (EGD) WITH PROPOFOL ;  Surgeon: Eartha Angelia Sieving, MD;  [facility];  Service: Gastroenterology;  Laterality: N/A;  100 ASA 2   HEMORRHOID SURGERY     x2   HERNIA REPAIR     HIATAL HERNIA REPAIR     JOINT REPLACEMENT     lft knee   KNEE ARTHROPLASTY     lft   KNEE ARTHROSCOPY Right    LEG SURGERY     x4 crushed    LITHOTRIPSY     MELANOMA  EXCISION     lft abdomen   NECK SURGERY  2012   POLYPECTOMY  02/19/2018   Procedure: POLYPECTOMY;  Surgeon: Golda Claudis PENNER, MD;  Location: [facility];  Service: Endoscopy;;  proximal transverse colon (CB x1), descending colon (CBx3)   POSTERIOR CERVICAL FUSION/FORAMINOTOMY  08/18/2011   Procedure: POSTERIOR CERVICAL FUSION/FORAMINOTOMY LEVEL 3;  Surgeon: Reyes JONETTA Budge, MD;  Location: [facility];  Service: Neurosurgery;  Laterality: N/A;  Cervical three-four, four-five, five-six posterior cervical  fusion with instrumentation; Right cervical three-four, four-five laminotomy   REVISION TOTAL HIP ARTHROPLASTY Left    SHOULDER ARTHROSCOPY     rt bith defect , spur, rotator cuff   SPINAL CORD STIMULATOR IMPLANT     rt hip   SPINAL CORD STIMULATOR INSERTION N/A 11/03/2016   Procedure: LUMBAR SPINAL CORD STIMULATOR REVISION;  Surgeon: Budge Reyes, MD;  [facility];  Service: Neurosurgery;  Laterality: N/A;   THYROID  LOBECTOMY     rt   Patient Active Problem List   Diagnosis Date Noted   Hematuria 01/08/2023   History of pulmonary embolus (PE) 01/08/2023   Pancytopenia (HCC) 01/08/2023   Chronic kidney disease, stage 3a (HCC) 01/08/2023   Hyperlipidemia 01/08/2023   AAA (abdominal aortic aneurysm) (HCC) 01/08/2023   Delirium    Acute metabolic encephalopathy 02/24/2022   Acute encephalopathy 02/23/2022   Sepsis due to undetermined organism (HCC) 02/23/2022   Volume depletion 02/12/2022   Acute respiratory failure with hypoxia (HCC) 02/12/2022   Paroxysmal atrial fibrillation (HCC) 02/12/2022   Multifocal pneumonia    GI bleeding 02/05/2022   IBS (irritable bowel syndrome) 11/18/2021   Acute pulmonary embolism (HCC) 05/05/2021   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   Fatty liver 08/14/2020   Acute ITP (HCC) 05/09/2020   Constipation 05/24/2019   Pancreatic calcification 05/24/2019   Gastroparesis 01/04/2019   N&V (nausea and vomiting) 11/17/2018    Debility 11/17/2018   Special screening for malignant neoplasms, colon 01/26/2018   Barrett's esophagus without dysplasia 01/26/2018   Thrombocytopenia (HCC) 04/02/2017   Lumbar stenosis with neurogenic claudication 11/03/2016   Intractable vomiting with nausea 05/29/2016   OSA (obstructive sleep apnea) 11/18/2013   Liver lesion 03/01/2013   Barrett's esophagus 03/01/2013   GERD (gastroesophageal reflux disease) 03/01/2013   Essential hypertension, benign 03/01/2013   S/P lumbar fusion 03/25/2012   Back pain without radiation 03/25/2012    PCP: Bertell Satterfield, MD  REFERRING PROVIDER: Deruyter, Recardo Browning, FNP  REFERRING DIAG:  cancer related pain   THERAPY DIAG:  Physical deconditioning  Muscle weakness (generalized)  Impaired functional mobility, balance,  gait, and endurance  Rationale for Evaluation and Treatment: Rehabilitation  ONSET DATE: First diagnosed 2007  SUBJECTIVE:   SUBJECTIVE STATEMENT: Patient arrives to PT for evaluation. Wife present throughout and contributes to history taking. Pt reports he has lymphoma and prostate cancer. Reports cancer has travelled to 5 bones at this point. Recent PSA score was 18. Did biopsies, revealed stage 4 cancer. Reports he just feels really weak, reports LE are really bad, got injection in L shoulder but hasn't helped as of yet. Reports he can't walk much, 1-2 minutes at most, some days are worse than others. Reports he also has back pain.   Pt actively taking chemo pills for cancer treatment. Pt allergic to demerol medication.   PERTINENT HISTORY: History of Present Illness  Tayten Bergdoll is a 73 y.o. 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 He is accompanied by his wife and his daughter.  He has been doing okay since his last visit. He has noticed ongoing fatigue, grade 1-2. He is not walking as much as before, though  he was not very active at baseline and has had a fall after his legs gave out on him. He has ongoing joint and bone pain (L shoulder, R hip, bilateral knees/shins), which was been going on for some time. These sites of pain do not completely correlate to known disease. He has good appetite, eating and drinking well. Denies nausea, vomiting, diarrhea, or constipation. He has ongoing hot flashes, but finds these manageable with a fan. He denies changes to urination. He still has discoloration to his forehead, previously seen by derm.    Spinal cord Stimulator, inserted 2018 R knee arthroscopy  Previous back surgeries Neck surgery 2012  PAIN:  Are you having pain? Yes: NPRS scale: 8/10 Pain location: back, legs, shoulders  Pain description: Achy, sharp Aggravating factors: Pushing up with LUE, walking  Relieving factors: laying flat, or against back of couch in semi reclined position  PRECAUTIONS: Other: Active CA mets to bone; Avoid excessive repetitive motion, heavy end range movement, high impact activity, caution with ther ex machines, no heat modalities   RED FLAGS: N/T in toes since back surgeries, reports recent constipation, MD aware   WEIGHT BEARING RESTRICTIONS: No  FALLS:  Has patient fallen in last 6 months? Yes. Number of falls 4. Most recent one where pt thinks he tripped, landed on face.  LIVING ENVIRONMENT: Lives with: lives with their spouse Lives in: House/apartment Stairs: Yes: External: 3-4 steps; can reach both Has following equipment at home: Single point cane, Walker - 2 wheeled, Wheelchair (manual), shower chair, bed side commode, Grab bars, and Scooter. Uses all as needed  OCCUPATION: N/A  PLOF: Needs assistance with ADLs, assist needed for drying after bathing, assist with iADLs.   PATIENT GOALS: : to work on leg strength, balance, decrease risk of falls, strengthen arms, and endurance  NEXT MD VISIT: every 3 months, may be around Nov/Dec 2025 fro next  follow up  OBJECTIVE:  Note: Objective measures were completed at Evaluation unless otherwise noted.  DIAGNOSTIC FINDINGS:  N/A  PATIENT SURVEYS:  LEFS  Extreme difficulty/unable (0), Quite a bit of difficulty (1), Moderate difficulty (2), Little difficulty (3), No difficulty (4) Survey date:    Any of your usual work, housework or school activities   2. Usual hobbies, recreational or sporting activities   3. Getting into/out of the bath   4. Walking between rooms  5. Putting on socks/shoes   6. Squatting    7. Lifting an object, like a bag of groceries from the floor   8. Performing light activities around your home   9. Performing heavy activities around your home   10. Getting into/out of a car   11. Walking 2 blocks   12. Walking 1 mile   13. Going up/down 10 stairs (1 flight)   14. Standing for 1 hour   15.  sitting for 1 hour   16. Running on even ground   17. Running on uneven ground   18. Making sharp turns while running fast   19. Hopping    20. Rolling over in bed   Score total:  Lower Extremity Functional Score: 27 / 80 = 33.8 %      COGNITION: Overall cognitive status: Within functional limits for tasks assessed     SENSATION: WFL   POSTURE: rounded shoulders, forward head, and increased thoracic kyphosis   LOWER EXTREMITY ROM:  Active ROM Right eval Left eval  Hip flexion    Hip extension    Hip abduction    Hip adduction    Hip internal rotation    Hip external rotation    Knee flexion    Knee extension    Ankle dorsiflexion    Ankle plantarflexion    Ankle inversion    Ankle eversion     (Blank rows = not tested)  LOWER EXTREMITY MMT:  MMT Right eval Left eval  Hip flexion 3+ 3+  Hip extension    Hip abduction 3+ 3+  Hip adduction 3+ 3+  Hip internal rotation    Hip external rotation    Knee flexion    Knee extension    Ankle dorsiflexion 4 4  Ankle plantarflexion    Ankle inversion    Ankle eversion     (Blank rows =  not tested)  LOWER EXTREMITY SPECIAL TESTS:    FUNCTIONAL TESTS:  30 seconds chair stand test Timed up and go (TUG): 14 2 minute walk test: Next Session   30 seconds: 3.5 STS, push off with LUE, very slow and labored movement   Tandem Balance: RLE leading-3, LLE leading-4  SLS: 2 bilaterally  GAIT: Distance walked: 75-100 ft in session  Assistive device utilized: None Level of assistance: Complete Independence Comments: Shuffling feet, slight in-toeing of LLE, dec hip ext/flex bilat causing dec step length bilat, slight bend in knee throughout entire gait cycle, Forward lean, inc thoracic kyphosis, dec arm swing                                                                                                                                TREATMENT DATE:  02/16/24: PT Eval and HEP    PATIENT EDUCATION:  Education details: PT evaluation, objective findings, POC, Importance of HEP, Precautions, Clinic policies Person educated: Patient Education method: Explanation and Demonstration Education comprehension: verbalized understanding and  returned demonstration  HOME EXERCISE PROGRAM: Access Code: [email or web address] URL: [email or web address] Date: 02/16/2024 Prepared by: Rosaria Powell-Butler  Exercises - Sit to Stand  - 2 x daily - 7 x weekly - 2 sets - 10 reps - Standing Hip Abduction with Counter Support  - 2 x daily - 7 x weekly - 2 sets - 10 reps - Standing Hip Extension with Counter Support  - 2 x daily - 7 x weekly - 2 sets - 10 reps - Seated Long Arc Quad  - 2 x daily - 7 x weekly - 2 sets - 10 reps  ASSESSMENT:  CLINICAL IMPRESSION: Patient is a 73 y.o. male who was seen today for physical therapy evaluation and treatment for  cancer related pain  . On this date, patient demonstrates decreased self perception of function, decreased LE strength, decreased endurance, impaired posture, decreased impaired balance, and general all over pain, all of which may be  contributing to patients altered gait pattern, difficulty with functional transfers, decreased activity tolerance, and increased fall risk. Patient will benefit from skilled physical therapy in order to address the above/below deficits in order to improve gait, balance, strength, endurance, transfers, and pain to improve quality of life and function.    OBJECTIVE IMPAIRMENTS: Abnormal gait, decreased activity tolerance, decreased balance, decreased coordination, decreased endurance, decreased mobility, difficulty walking, decreased ROM, decreased strength, impaired flexibility, improper body mechanics, postural dysfunction, and pain.   ACTIVITY LIMITATIONS: carrying, lifting, bending, sitting, standing, squatting, stairs, transfers, bed mobility, bathing, dressing, and reach over head  PARTICIPATION LIMITATIONS: meal prep, cleaning, laundry, community activity, and yard work  PERSONAL FACTORS: Time since onset of injury/illness/exacerbation and 1-2 comorbidities: See above are also affecting patient's functional outcome.   REHAB POTENTIAL: Fair    CLINICAL DECISION MAKING: Stable/uncomplicated  EVALUATION COMPLEXITY: Low   GOALS: Goals reviewed with patient? No  SHORT TERM GOALS: Target date: 03/08/24 Patient will be independent with performance of HEP to demonstrate adequate self management of symptoms.  Baseline:  Goal status: INITIAL  2.   Patient will report at least a 25% improvement with function and/or pain reduction overall since beginning PT. Baseline:  Goal status: INITIAL;   LONG TERM GOALS: Target date: 04/12/24 Patient will improve LEFS score by 9 points to demonstrate improved perceived function while meeting MCID.  Baseline: Goal status: INITIAL 2.  Patient will improve TUG score to 12 seconds or less in order to demonstrate decreased risk of falls.   Baseline:  Goal status: INITIAL 3.  Patient will improve  BLE MMT testing by at least 1/2 a grade to demonstrate  increased LE strength and/or power needed to improve functional tranfers. Baseline:  Goal status: INITIAL   4.  Patient will increase 2 minute walk test gait distance to 20 ft with least restrictive assistive device to demonstrate improved endurance and functional mobility needed for ambulating household and community distances.  Baseline: Goal status: INITIAL  5. Patient will report pain rating of 6/10 or less following performance of ADLs in order to demonstrate progress towards independence with everyday activity. Baseline: Goal status: INITIAL    PLAN:  PT FREQUENCY: 2x/week  PT DURATION: 8 weeks  PLANNED INTERVENTIONS: 97164- PT Re-evaluation, 97110-Therapeutic exercises, 97530- Therapeutic activity, 97112- Neuromuscular re-education, 97535- Self Care, [id number]- Manual therapy, [phone]- Gait training, [phone]- Traction (mechanical), Patient/Family education, Balance training, Stair training, Taping, Joint mobilization, Spinal mobilization, and Cryotherapy  PLAN FOR NEXT SESSION: Review HEP and goals, 2 minute walk test, general mobility, general strengthening, endurance,  address balance, pt wife mentioned possibly being trained on body mechanics when assisting patient on his bad days   12:08 PM, 02/17/24 Rosaria Settler, PT, DPT [facility] [place]

## 2024-02-16 ENCOUNTER — Ambulatory Visit (HOSPITAL_COMMUNITY): Attending: Medical Oncology

## 2024-02-16 ENCOUNTER — Encounter (HOSPITAL_COMMUNITY): Payer: Self-pay

## 2024-02-16 ENCOUNTER — Other Ambulatory Visit: Payer: Self-pay

## 2024-02-16 DIAGNOSIS — M6281 Muscle weakness (generalized): Secondary | ICD-10-CM | POA: Diagnosis present

## 2024-02-16 DIAGNOSIS — Z7409 Other reduced mobility: Secondary | ICD-10-CM | POA: Diagnosis present

## 2024-02-16 DIAGNOSIS — R5381 Other malaise: Secondary | ICD-10-CM | POA: Diagnosis present

## 2024-02-22 ENCOUNTER — Encounter (INDEPENDENT_AMBULATORY_CARE_PROVIDER_SITE_OTHER): Payer: Self-pay | Admitting: Gastroenterology

## 2024-02-22 ENCOUNTER — Telehealth (INDEPENDENT_AMBULATORY_CARE_PROVIDER_SITE_OTHER): Payer: Self-pay

## 2024-02-22 ENCOUNTER — Ambulatory Visit (INDEPENDENT_AMBULATORY_CARE_PROVIDER_SITE_OTHER): Admitting: Gastroenterology

## 2024-02-22 VITALS — BP 111/78 | HR 68 | Temp 98.2°F | Ht 71.0 in | Wt 234.1 lb

## 2024-02-22 DIAGNOSIS — R112 Nausea with vomiting, unspecified: Secondary | ICD-10-CM | POA: Diagnosis not present

## 2024-02-22 DIAGNOSIS — R1031 Right lower quadrant pain: Secondary | ICD-10-CM | POA: Diagnosis not present

## 2024-02-22 DIAGNOSIS — R109 Unspecified abdominal pain: Secondary | ICD-10-CM | POA: Insufficient documentation

## 2024-02-22 NOTE — Telephone Encounter (Signed)
 Spoke with patient and is wife and gave them CT appointment details.

## 2024-02-22 NOTE — Progress Notes (Addendum)
 Referring Provider: Bertell Satterfield, MD Primary Care Physician:  Maree Isles, MD Primary GI Physician: Dr. Eartha   Chief Complaint  Patient presents with   Abdominal Pain    Patient here today due to having nausea and RLQ pain ongoing for the last month.     HPI:   [name] is a 73 y.o. 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 Eartha, at that time continued on dexilant for GERD, relgan 10mg  prior to breakfast and evening meal, having recurrent vomiting, heartburn maybe once a week, chronic history of constipation, taking 4 metamucil gummies per day, BM every 2-3 days, miralax  in the past did not work, linzess  not covered by insurance   Notably, the patient was seen by [facility] (Dr. Tobie) on 01/04/2021 at the patient had a positive hepatitis B core antibody.  He was recommended to be started on tenofovir for prevention of reactivation of hepatitis B while on rituximab and to be continued on this up to a year after his last dose of rituximab. He only received it for one month and finished in May 2022.   Recommended to schedule EGD, start amitiza  8mcg BID, continue metamucil, read about G POEM  Patient was later referred to [facility] (never seen per patient)  Amitiza  was not covered by insurance, therefore Trulance  3mg  daily was sent, started on motegrity  in June 2023 after EGD  Present: Reports he has nausea everyday. He reports this has been going on for years. He has occasional vomiting, sometimes will spit up phlegm. He is taking dexilant for GERD, a probiotic daily. He is not taking reglan , taking zofran  which seems to  help but then nausea will return once this wears off. His wife states that nausea is much worse than baseline. Appetite is good. Does not feel that eating changes his pain. Denies GERD symptoms. He has had some mild weight loss. States RLQ pain come and goes, sometimes he has to grab his side, pain will last for a short period then dissipate. He does reports pain radiates some into R flank, does endorse some urinary incontinence, history of stage IV prostate cancer, history of lymphoma as well. He is currently on chemo pills.  He has some constipation, denies any diarrhea. States he has tried everything for constipation without improvement. Has a BM every 2-3 days, without rectal bleeding or melena.   Last labs with CMP on 8/28 with BUN 21, creat 1.4, CBC with WBC 2.5, hgb 12.3, plt 66k MPV 11.8, neutrophil 1.5  PSA 0.06 LDH 116    CT C/A/P with contrast w MIPS: 07/2023  Similar splenomegaly. Otherwise, no CT evidence of visceral metastatic  disease in the chest, abdomen, or pelvis.  2.  Please refer to forthcoming bone scan for additional osseous findings.  3.  Increased bibasilar reticulations and groundglass opacities, component  of which may be atelectatic, although findings are concerning for worsening  fibrotic changes within the lower lungs. Consider dedicated evaluation with  ILD protocol chest CT and/or pulmonology referral as symptoms warrant.  4.  Stable dilation of the mid ascending aorta measuring up to 4.5 cm.   Last Colonoscopy: 2019 - Four small polyps in the  descending colon and in                            the transverse colon. Biopsied.                           - Diverticulosis in the entire examined colon.                           - External hemorrhoids. (TAs)   Repeat Colonoscopy 5 years   Last Endoscopy: 12/2021 - Salmon-colored mucosa consistent with                            short-segment Barrett's esophagus.                           - Erosive gastropathy with  stigmata of recent                            bleeding. Biopsied.                           - Non-bleeding duodenal diverticulum.  Reactive gastropathy.  -    Negative for an inflammatory pattern predictive of Helicobacter  pylori infection.  -    Negative for intestinal metaplasia and malignancy.   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   Coronary artery disease    mild by 09/08/13 cath [facility]   GERD (gastroesophageal reflux disease)    barretts esophagus   H/O hiatal hernia    Headache(784.0)    Hypercholesteremia    Hypertension    PONV (postoperative nausea and vomiting)    trouble breathing after last surgery (at [facility])   Shingles    10   Sleep apnea    not tested   Tumor liver    no problems    Past Surgical History:  Procedure Laterality Date   ABDOMINAL SURGERY     x3   back surgeries     x 9   BACK SURGERY     x8   BIOPSY N/A 03/28/2013   Procedure: Distal Esophageal Biopsy;  Surgeon: Claudis RAYMOND Rivet, MD;  Location: [facility];  Service: Endoscopy;  Laterality: N/A;   BIOPSY  05/30/2016   Procedure: BIOPSY;  Surgeon: Claudis RAYMOND Rivet, MD;  [facility]: [facility];  Service: Endoscopy;;   BIOPSY  02/19/2018   Procedure: BIOPSY;  Surgeon: Rivet Claudis RAYMOND, MD;  [facility]: [facility];  Service: Endoscopy;;  Barret's esophagus   BIOPSY  08/29/2020   Procedure: BIOPSY;  Surgeon: Rivet Claudis RAYMOND, MD;  [facility]: [facility];  Service: Endoscopy;;   BIOPSY  12/31/2021   Procedure: BIOPSY;  Surgeon: Eartha Angelia Sieving, MD;  [facility];  Service: Gastroenterology;;   CARDIAC CATHETERIZATION  2020  CHEST SURGERY     lung incisional hernia for hernia    COLONOSCOPY WITH PROPOFOL  N/A 02/19/2018   Procedure: COLONOSCOPY WITH PROPOFOL ;  Surgeon: Golda Claudis PENNER, MD;  [facility];  Service: Endoscopy;  Laterality: N/A;  1:00   CORONARY ANGIOPLASTY WITH STENT PLACEMENT  2020   2 blockages: 1 stent   ESOPHAGOGASTRODUODENOSCOPY (EGD) WITH PROPOFOL  N/A 03/28/2013   Procedure: ESOPHAGOGASTRODUODENOSCOPY (EGD) WITH PROPOFOL ;  Surgeon: Claudis PENNER Golda, MD;  Location: [facility];  Service: Endoscopy;  Laterality: N/A;  GE junction @ 38, proximal  margin @37    ESOPHAGOGASTRODUODENOSCOPY (EGD) WITH PROPOFOL  N/A 05/30/2016   Procedure: ESOPHAGOGASTRODUODENOSCOPY (EGD) WITH PROPOFOL ;  Surgeon: Claudis PENNER Golda, MD;  [facility];  Service: Endoscopy;  Laterality: N/A;  955   ESOPHAGOGASTRODUODENOSCOPY (EGD) WITH PROPOFOL  N/A 02/19/2018   Procedure: ESOPHAGOGASTRODUODENOSCOPY (EGD) WITH PROPOFOL ;  Surgeon: Golda Claudis PENNER, MD;  [facility];  Service: Endoscopy;  Laterality: N/A;   ESOPHAGOGASTRODUODENOSCOPY (EGD) WITH PROPOFOL  N/A 08/29/2020   Procedure: ESOPHAGOGASTRODUODENOSCOPY (EGD) WITH PROPOFOL ;  Surgeon: Golda Claudis PENNER, MD;  [facility];  Service: Endoscopy;  Laterality: N/A;  AM   ESOPHAGOGASTRODUODENOSCOPY (EGD) WITH PROPOFOL  N/A 12/31/2021   Procedure: ESOPHAGOGASTRODUODENOSCOPY (EGD) WITH PROPOFOL ;  Surgeon: Eartha Angelia Sieving, MD;  [facility];  Service: Gastroenterology;  Laterality: N/A;  100 ASA 2   HEMORRHOID SURGERY     x2   HERNIA REPAIR     HIATAL HERNIA REPAIR     JOINT REPLACEMENT     lft knee   KNEE ARTHROPLASTY     lft   KNEE ARTHROSCOPY Right    LEG SURGERY     x4 crushed    LITHOTRIPSY     MELANOMA EXCISION     lft abdomen   NECK SURGERY  2012   POLYPECTOMY  02/19/2018   Procedure: POLYPECTOMY;  Surgeon: Golda Claudis PENNER, MD;  [facility];  Service: Endoscopy;;  proximal transverse colon (CB x1), descending colon (CBx3)   POSTERIOR CERVICAL  FUSION/FORAMINOTOMY  08/18/2011   Procedure: POSTERIOR CERVICAL FUSION/FORAMINOTOMY LEVEL 3;  Surgeon: Reyes JONETTA Budge, MD;  Location: [facility];  Service: Neurosurgery;  Laterality: N/A;  Cervical three-four, four-five, five-six posterior cervical  fusion with instrumentation; Right cervical three-four, four-five laminotomy   REVISION TOTAL HIP ARTHROPLASTY Left    SHOULDER ARTHROSCOPY     rt bith defect , spur, rotator cuff   SPINAL CORD STIMULATOR IMPLANT     rt hip   SPINAL CORD STIMULATOR INSERTION N/A 11/03/2016   Procedure: LUMBAR SPINAL CORD STIMULATOR REVISION;  Surgeon: Budge Reyes, MD;  Location: [facility];  Service: Neurosurgery;  Laterality: N/A;   THYROID  LOBECTOMY     rt    Current Outpatient Medications  Medication Sig Dispense Refill   Bioflavonoid Products (ESTER C PO) Take 60 mg by mouth in the morning and at bedtime.     BRUKINSA 80 MG capsule Take 160 mg by mouth 2 (two) times daily. (Patient taking differently: Take 80 mg by mouth 2 (two) times daily.)     Calcium  Carbonate-Vitamin D  600-10 MG-MCG TABS Take 1 tablet by mouth 2 (two) times daily.      cyclobenzaprine  (FLEXERIL ) 10 MG tablet Take 10 mg by mouth in the morning and at bedtime.     dexlansoprazole (DEXILANT) 60 MG capsule Take 60 mg by mouth daily.      diazepam  (VALIUM ) 10 MG tablet Take 10 mg by mouth in the  morning and at bedtime. (Patient taking differently: Take 10 mg by mouth 3 (three) times daily.)     diltiazem  (CARDIZEM ) 30 MG tablet Take 30 mg by mouth 2 (two) times daily.     fexofenadine (ALLEGRA) 180 MG tablet Take 180 mg by mouth every evening.     fish oil-omega-3 fatty acids  1000 MG capsule Take 1 g by mouth 2 (two) times daily.     Lactobacillus (ACIDOPHILUS) TABS Take by mouth. (Patient taking differently: Take by mouth every morning.)     lisinopril  (ZESTRIL ) 10 MG tablet Take 10 mg by mouth daily.     metoCLOPramide  (REGLAN ) 10 MG tablet Take 1 tablet by mouth in the morning and at  bedtime.     mirabegron ER (MYRBETRIQ) 25 MG TB24 tablet Take 25 mg by mouth daily. (Patient taking differently: Take 50 mg by mouth daily.)     montelukast  (SINGULAIR ) 10 MG tablet Take 10 mg by mouth daily.      Multiple Vitamin (MULITIVITAMIN WITH MINERALS) TABS Take 1 tablet by mouth daily.     NUBEQA 300 MG tablet Take 600 mg by mouth 2 (two) times daily.     ondansetron  (ZOFRAN ) 8 MG tablet Take 8 mg by mouth as needed for nausea or vomiting.     oxyCODONE -acetaminophen  (PERCOCET) 10-325 MG tablet Take by mouth. (Patient taking differently: Take by mouth as needed.)     Probiotic Product (PROBIOTIC PO) Take 1 tablet by mouth daily.     ranolazine  (RANEXA ) 500 MG 12 hr tablet Take 500 mg by mouth 2 (two) times daily.     rivaroxaban  (XARELTO ) 20 MG TABS tablet Take 10 mg by mouth daily.     rosuvastatin  (CRESTOR ) 10 MG tablet Take 5 mg by mouth daily.     tamsulosin (FLOMAX) 0.4 MG CAPS capsule Take 0.4 mg by mouth daily after supper.     No current facility-administered medications for this visit.    Allergies as of 02/22/2024 - Review Complete 02/22/2024  Allergen Reaction Noted   Demerol Other (See Comments) 08/01/2011    Social History   Socioeconomic History   Marital status: Married    Spouse name: Not on file   Number of children: Not on file   Years of education: Not on file   Highest education level: Not on file  Occupational History   Occupation: retired  Tobacco Use   Smoking status: Former    Current packs/day: 0.00    Average packs/day: 4.0 packs/day for 10.0 years (40.0 ttl pk-yrs)    Types: Cigarettes    Start date: 08/13/1970    Quit date: 08/12/1980    Years since quitting: 43.5   Smokeless tobacco: Never   Tobacco comments:    occ wine  Vaping Use   Vaping status: Never Used  Substance and Sexual Activity   Alcohol use: Not Currently   Drug use: No   Sexual activity: Yes    Birth control/protection: None  Other Topics Concern   Not on file   Social History Narrative   Not on file   Social Drivers of Health   Financial Resource Strain: Low Risk  (05/09/2020)   Overall Financial Resource Strain (CARDIA)    Difficulty of Paying Living Expenses: Not hard at all  Food Insecurity: No Food Insecurity (07/09/2023)   Received from [facility]   Hunger Vital Sign    Within the past 12 months, you worried that your food would run out before you  got the money to buy more.: Never true    Within the past 12 months, the food you bought just didn't last and you didn't have money to get more.: Never true  Transportation Needs: No Transportation Needs (01/08/2023)   PRAPARE - Administrator, Civil Service (Medical): No    Lack of Transportation (Non-Medical): No  Physical Activity: Inactive (05/09/2020)   Exercise Vital Sign    Days of Exercise per Week: 0 days    Minutes of Exercise per Session: 0 min  Stress: No Stress Concern Present (05/09/2020)   [employer] of Occupational Health - Occupational Stress Questionnaire    Feeling of Stress : Not at all  Social Connections: Unknown (10/15/2021)   [facility]   Social Network    Social Network: Not on file    Review of systems General: negative for malaise, night sweats, fever, chills+weight loss  Neck: Negative for lumps, goiter, pain and significant neck swelling Resp: Negative for cough, wheezing, dyspnea at rest CV: Negative for chest pain, leg swelling, palpitations, orthopnea GI: denies melena, hematochezia, diarrhea, dysphagia, odyonophagia, early satiety or unintentional weight loss. +R flank/RLQ pain +nausea +vomiting +constipation  MSK: Negative for joint pain or swelling, back pain, and muscle pain. Derm: Negative for itching or rash Psych: Denies depression, anxiety, memory loss, confusion. No homicidal or suicidal ideation.  Heme: Negative for prolonged bleeding, bruising easily, and swollen nodes. Endocrine: Negative for  cold or heat intolerance, polyuria, polydipsia and goiter. Neuro: negative for tremor, gait imbalance, syncope and seizures. The remainder of the review of systems is noncontributory.  Physical Exam: BP 111/78 (BP Location: Left Arm, Patient Position: Sitting, Cuff Size: Normal)   Pulse 68   Temp 98.2 F (36.8 C) (Oral)   Ht 5' 11 (1.803 m)   Wt 234 lb 1.6 oz (106.2 kg)   BMI 32.65 kg/m  General:   Alert and oriented. No distress noted. Pleasant and cooperative.  Head:  Normocephalic and atraumatic. Eyes:  Conjuctiva clear without scleral icterus. Mouth:  Oral mucosa pink and moist. Good dentition. No lesions. Heart: Normal rate and rhythm, s1 and s2 heart sounds present.  Lungs: Clear lung sounds in all lobes. Respirations equal and unlabored. Abdomen:  +BS, soft,and non-distended. Some Mild TTP of RLQ/R flank. No rebound or guarding. No HSM or masses noted. Derm: No palmar erythema or jaundice Msk:  Symmetrical without gross deformities. Normal posture. Extremities:  Without edema. Neurologic:  Alert and  oriented x4 Psych:  Alert and cooperative. Normal mood and affect.  Invalid input(s): 6 MONTHS   ASSESSMENT: [name] is a 73 y.o. male presenting today for RLQ/R flank pain and nausea  Patient with history of gastroparesis, more significantly history of splenic lymphoma and metastatic prostate cancer currently on PO chemo who presents with RLQ/R flank pain with worsening of his baseline nausea. Takes reglan  BID and taking zofran  PRN which seems to help. RLQ pain can occur at any time, seems almost colicky in nature and radiates into R flank. He denies hematuria or urinary symptoms other than incontinence at baseline. No fevers or chills. History of kidney stones in the past. No rectal bleeding, melena, diarrhea, has constipation at baseline. Pain is not influenced by eating. Unclear etiology of his symptoms, could be secondary to chronic constipation, =or renal etiology  such as kidney stone, could be some aspect of paraneoplastic syndrome. At this time, would recommend CT A/P with contrast for further evaluation, continue current anti  emetic regimen.    PLAN:  -continue reglan  BID -continue zofran  PRN -schedule CT A/P with contrast  All questions were answered, patient verbalized understanding and is in agreement with plan as outlined above.   Follow Up: 6 weeks   Asya Derryberry L. Mariette, MSN, APRN, AGNP-C Adult-Gerontology Nurse Practitioner [facility]  I have reviewed the note and agree with the APP's assessment as described in this progress note  Toribio Fortune, MD Gastroenterology and Hepatology [facility]

## 2024-02-22 NOTE — Patient Instructions (Signed)
 We will get you schedule for a CT scan to further evaluate your abdominal pain Please continue taking your reglan  and your zofran  for nausea  Follow up 6 weeks

## 2024-02-24 ENCOUNTER — Encounter (HOSPITAL_COMMUNITY): Payer: Self-pay

## 2024-02-24 ENCOUNTER — Ambulatory Visit (HOSPITAL_COMMUNITY)

## 2024-02-24 DIAGNOSIS — Z7409 Other reduced mobility: Secondary | ICD-10-CM | POA: Diagnosis not present

## 2024-02-24 DIAGNOSIS — R5381 Other malaise: Secondary | ICD-10-CM

## 2024-02-24 DIAGNOSIS — M6281 Muscle weakness (generalized): Secondary | ICD-10-CM

## 2024-02-24 NOTE — Therapy (Signed)
 OUTPATIENT PHYSICAL THERAPY LOWER EXTREMITY TREATMENT   Patient Name: ADEOLUWA SILVERS MRN: [id number] DOB:May 29, 1951, 73 y.o., male Today's Date: 02/24/2024  END OF SESSION:  PT End of Session - 02/24/24 1416     Visit Number 2    Number of Visits 16    Date for Recertification  03/15/24    Authorization Type Medicare Part A and B    Authorization Time Period no auth    Progress Note Due on Visit 10    PT Start Time 1417    PT Stop Time 1456    PT Time Calculation (min) 39 min    Activity Tolerance Patient tolerated treatment well;Patient limited by fatigue    Behavior During Therapy [facility] for tasks assessed/performed          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   Coronary artery disease    mild by 09/08/13 cath [facility]   GERD (gastroesophageal reflux disease)    barretts esophagus   H/O hiatal hernia    Headache(784.0)    Hypercholesteremia    Hypertension    PONV (postoperative nausea and vomiting)    trouble breathing after last surgery (at [facility])   Shingles    10   Sleep apnea    not tested   Tumor liver    no problems   Past Surgical History:  Procedure Laterality Date   ABDOMINAL SURGERY     x3   back surgeries     x 9   BACK SURGERY     x8   BIOPSY N/A 03/28/2013   Procedure: Distal Esophageal Biopsy;  Surgeon: Claudis RAYMOND Rivet, MD;  Location: [facility];  Service: Endoscopy;  Laterality: N/A;   BIOPSY  05/30/2016   Procedure: BIOPSY;  Surgeon: Claudis RAYMOND Rivet, MD;  [facility];  Service: Endoscopy;;   BIOPSY  02/19/2018   Procedure: BIOPSY;  Surgeon: Rivet Claudis RAYMOND, MD;  [facility];  Service: Endoscopy;;  Barret's esophagus   BIOPSY  08/29/2020   Procedure: BIOPSY;  Surgeon: Rivet Claudis RAYMOND, MD;  [facility];  Service: Endoscopy;;   BIOPSY  12/31/2021   Procedure: BIOPSY;  Surgeon: Eartha Angelia Sieving, MD;  [facility];  Service: Gastroenterology;;   CARDIAC CATHETERIZATION  2020   CHEST SURGERY     lung incisional hernia for hernia   COLONOSCOPY WITH PROPOFOL  N/A 02/19/2018   Procedure: COLONOSCOPY WITH PROPOFOL ;  Surgeon: Rivet Claudis RAYMOND, MD;  [facility];  Service: Endoscopy;  Laterality: N/A;  1:00   CORONARY ANGIOPLASTY WITH STENT PLACEMENT  2020   2 blockages: 1 stent   ESOPHAGOGASTRODUODENOSCOPY (EGD) WITH PROPOFOL  N/A 03/28/2013   Procedure: ESOPHAGOGASTRODUODENOSCOPY (EGD) WITH PROPOFOL ;  Surgeon: Claudis RAYMOND Rivet, MD;  Location: [facility];  Service: Endoscopy;  Laterality: N/A;  GE junction @ 38, proximal  margin @37    ESOPHAGOGASTRODUODENOSCOPY (EGD) WITH PROPOFOL  N/A 05/30/2016   Procedure: ESOPHAGOGASTRODUODENOSCOPY (EGD) WITH PROPOFOL ;  Surgeon: Claudis RAYMOND Rivet, MD;  [facility];  Service: Endoscopy;  Laterality: N/A;  955   ESOPHAGOGASTRODUODENOSCOPY (EGD) WITH PROPOFOL  N/A 02/19/2018   Procedure: ESOPHAGOGASTRODUODENOSCOPY (EGD) WITH PROPOFOL ;  Surgeon:  Golda Claudis PENNER, MD;  [facility];  Service: Endoscopy;  Laterality: N/A;   ESOPHAGOGASTRODUODENOSCOPY (EGD) WITH PROPOFOL  N/A 08/29/2020   Procedure: ESOPHAGOGASTRODUODENOSCOPY (EGD) WITH PROPOFOL ;  Surgeon: Golda Claudis PENNER, MD;  [facility];  Service: Endoscopy;  Laterality: N/A;  AM   ESOPHAGOGASTRODUODENOSCOPY (EGD) WITH PROPOFOL  N/A 12/31/2021   Procedure: ESOPHAGOGASTRODUODENOSCOPY (EGD) WITH PROPOFOL ;  Surgeon: Eartha Angelia Sieving, MD;  [facility];  Service: Gastroenterology;  Laterality: N/A;  100 ASA 2   HEMORRHOID SURGERY     x2   HERNIA REPAIR     HIATAL HERNIA REPAIR     JOINT REPLACEMENT     lft knee   KNEE ARTHROPLASTY     lft   KNEE ARTHROSCOPY Right    LEG SURGERY     x4 crushed    LITHOTRIPSY     MELANOMA EXCISION      lft abdomen   NECK SURGERY  2012   POLYPECTOMY  02/19/2018   Procedure: POLYPECTOMY;  Surgeon: Golda Claudis PENNER, MD;  [facility];  Service: Endoscopy;;  proximal transverse colon (CB x1), descending colon (CBx3)   POSTERIOR CERVICAL FUSION/FORAMINOTOMY  08/18/2011   Procedure: POSTERIOR CERVICAL FUSION/FORAMINOTOMY LEVEL 3;  Surgeon: Reyes JONETTA Budge, MD;  [facility];  Service: Neurosurgery;  Laterality: N/A;  Cervical three-four, four-five, five-six posterior cervical  fusion with instrumentation; Right cervical three-four, four-five laminotomy   REVISION TOTAL HIP ARTHROPLASTY Left    SHOULDER ARTHROSCOPY     rt bith defect , spur, rotator cuff   SPINAL CORD STIMULATOR IMPLANT     rt hip   SPINAL CORD STIMULATOR INSERTION N/A 11/03/2016   Procedure: LUMBAR SPINAL CORD STIMULATOR REVISION;  Surgeon: Budge Reyes, MD;  Location[facility];  Service: Neurosurgery;  Laterality: N/A;   THYROID  LOBECTOMY     rt   Patient Active Problem List   Diagnosis Date Noted   Right flank pain 02/22/2024   RLQ abdominal pain 02/22/2024   Hematuria 01/08/2023   History of pulmonary embolus (PE) 01/08/2023   Pancytopenia (HCC) 01/08/2023   Chronic kidney disease, stage 3a (HCC) 01/08/2023   Hyperlipidemia 01/08/2023   AAA (abdominal aortic aneurysm) 01/08/2023   Delirium    Acute metabolic encephalopathy 02/24/2022   Acute encephalopathy 02/23/2022   Sepsis due to undetermined organism (HCC) 02/23/2022   Volume depletion 02/12/2022   Acute respiratory failure with hypoxia (HCC) 02/12/2022   Paroxysmal atrial fibrillation (HCC) 02/12/2022   Multifocal pneumonia    GI bleeding 02/05/2022   IBS (irritable bowel syndrome) 11/18/2021   Acute pulmonary embolism (HCC) 05/05/2021   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   Fatty liver 08/14/2020   Acute ITP (HCC) 05/09/2020   Constipation 05/24/2019   Pancreatic calcification 05/24/2019   Gastroparesis 01/04/2019    N&V (nausea and vomiting) 11/17/2018   Debility 11/17/2018   Special screening for malignant neoplasms, colon 01/26/2018   Barrett's esophagus without dysplasia 01/26/2018   Thrombocytopenia 04/02/2017   Lumbar stenosis with neurogenic claudication 11/03/2016   Intractable vomiting with nausea 05/29/2016   OSA (obstructive sleep apnea) 11/18/2013   Liver lesion 03/01/2013   Barrett's esophagus 03/01/2013   GERD (gastroesophageal reflux disease) 03/01/2013   Essential hypertension, benign 03/01/2013   S/P lumbar fusion 03/25/2012   Back pain without radiation 03/25/2012    PCP: Bertell Satterfield, MD  REFERRING PROVIDER: Deruyter, Recardo Browning, FNP  REFERRING DIAG:  cancer related pain   THERAPY DIAG:  Physical deconditioning  Muscle weakness (generalized)  Impaired functional mobility, balance, gait, and endurance  Rationale for Evaluation and Treatment: Rehabilitation  ONSET DATE: First diagnosed 2007  SUBJECTIVE:   SUBJECTIVE STATEMENT: Pt reports 7/10 overall pain today. Reports HEP went well besides LAQ due to LLE pain, reports sit to stand rough on back. No questions or concerns otherwise.     EVAL: Patient arrives to PT for evaluation. Wife present throughout and contributes to history taking. Pt reports he has lymphoma and prostate cancer. Reports cancer has travelled to 5 bones at this point. Recent PSA score was 18. Did biopsies, revealed stage 4 cancer. Reports he just feels really weak, reports LE are really bad, got injection in L shoulder but hasn't helped as of yet. Reports he can't walk much, 1-2 minutes at most, some days are worse than others. Reports he also has back pain.   Pt actively taking chemo pills for cancer treatment. Pt allergic to demerol medication.   PERTINENT HISTORY: History of Present Illness  Branston Halsted is a 73 y.o. 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 He is accompanied by his wife and his daughter.  He has been doing okay since his last visit. He has noticed ongoing fatigue, grade 1-2. He is not walking as much as before, though he was not very active at baseline and has had a fall after his legs gave out on him. He has ongoing joint and bone pain (L shoulder, R hip, bilateral knees/shins), which was been going on for some time. These sites of pain do not completely correlate to known disease. He has good appetite, eating and drinking well. Denies nausea, vomiting, diarrhea, or constipation. He has ongoing hot flashes, but finds these manageable with a fan. He denies changes to urination. He still has discoloration to his forehead, previously seen by derm.    Spinal cord Stimulator, inserted 2018 R knee arthroscopy  Previous back surgeries Neck surgery 2012  PAIN:  Are you having pain? Yes: NPRS scale: 8/10 Pain location: back, legs, shoulders  Pain description: Achy, sharp Aggravating factors: Pushing up with LUE, walking  Relieving factors: laying flat, or against back of couch in semi reclined position  PRECAUTIONS: Other: Active CA mets to bone; Avoid excessive repetitive motion, heavy end range movement, high impact activity, caution with ther ex machines, no heat modalities   RED FLAGS: N/T in toes since back surgeries, reports recent constipation, MD aware   WEIGHT BEARING RESTRICTIONS: No  FALLS:  Has patient fallen in last 6 months? Yes. Number of falls 4. Most recent one where pt thinks he tripped, landed on face.  LIVING ENVIRONMENT: Lives with: lives with their spouse Lives in: House/apartment Stairs: Yes: External: 3-4 steps; can reach both Has following equipment at home: Single point cane, Walker - 2 wheeled, Wheelchair (manual), shower chair, bed side commode, Grab bars, and Scooter. Uses all as needed  OCCUPATION: N/A  PLOF: Needs assistance with ADLs, assist needed for drying after  bathing, assist with iADLs.   PATIENT GOALS: : to work on leg strength, balance, decrease risk of falls, strengthen arms, and endurance  NEXT MD VISIT: every 3 months, may be around Nov/Dec 2025 fro next follow up  OBJECTIVE:  Note: Objective measures were completed at Evaluation unless otherwise noted.  DIAGNOSTIC FINDINGS:  N/A  PATIENT SURVEYS:  LEFS  Extreme difficulty/unable (0), Quite a bit of difficulty (1), Moderate difficulty (2), Little difficulty (  3), No difficulty (4) Survey date:    Any of your usual work, housework or school activities   2. Usual hobbies, recreational or sporting activities   3. Getting into/out of the bath   4. Walking between rooms   5. Putting on socks/shoes   6. Squatting    7. Lifting an object, like a bag of groceries from the floor   8. Performing light activities around your home   9. Performing heavy activities around your home   10. Getting into/out of a car   11. Walking 2 blocks   12. Walking 1 mile   13. Going up/down 10 stairs (1 flight)   14. Standing for 1 hour   15.  sitting for 1 hour   16. Running on even ground   17. Running on uneven ground   18. Making sharp turns while running fast   19. Hopping    20. Rolling over in bed   Score total:  Lower Extremity Functional Score: 27 / 80 = 33.8 %      COGNITION: Overall cognitive status: Within functional limits for tasks assessed     SENSATION: WFL   POSTURE: rounded shoulders, forward head, and increased thoracic kyphosis   LOWER EXTREMITY ROM:  Active ROM Right eval Left eval  Hip flexion    Hip extension    Hip abduction    Hip adduction    Hip internal rotation    Hip external rotation    Knee flexion    Knee extension    Ankle dorsiflexion    Ankle plantarflexion    Ankle inversion    Ankle eversion     (Blank rows = not tested)  LOWER EXTREMITY MMT:  MMT Right eval Left eval  Hip flexion 3+ 3+  Hip extension    Hip abduction 3+ 3+  Hip  adduction 3+ 3+  Hip internal rotation    Hip external rotation    Knee flexion    Knee extension    Ankle dorsiflexion 4 4  Ankle plantarflexion    Ankle inversion    Ankle eversion     (Blank rows = not tested)  LOWER EXTREMITY SPECIAL TESTS:    FUNCTIONAL TESTS:  30 seconds chair stand test Timed up and go (TUG): 14 2 minute walk test: 308 ft, 3 standing breaks between, reports back pain 8-9/10.   30 seconds: 3.5 STS, push off with LUE, very slow and labored movement   Tandem Balance: RLE leading-3, LLE leading-4  SLS: 2 bilaterally  GAIT: Distance walked: 75-100 ft in session  Assistive device utilized: None Level of assistance: Complete Independence Comments: Shuffling feet, slight in-toeing of LLE, dec hip ext/flex bilat causing dec step length bilat, slight bend in knee throughout entire gait cycle, Forward lean, inc thoracic kyphosis, dec arm swing  TREATMENT DATE:  02/24/24: Review of goals 2 minute walk test, 3 standing rest breaks, reports inc back pain HR: 121 bpm HR after 5 min rest:104 bpm HR following 5 min rest Review of HEP: Standing hip ext, 10x each LE Standing hip abduction, 10x each LE Mini squats with counter support, 5x2 Cold pack donned around neck and seated rest at end of session, terminated early due to fatigue and HR (113 by end of seated rest)  02/16/24: PT Eval and HEP    PATIENT EDUCATION:  Education details: PT evaluation, objective findings, POC, Importance of HEP, Precautions, Clinic policies Person educated: Patient Education method: Explanation and Demonstration Education comprehension: verbalized understanding and returned demonstration  HOME EXERCISE PROGRAM: Access Code: [email or web address] URL: [email or web address] Date: 02/16/2024 Prepared by: Rosaria Powell-Butler  Exercises - Standing  Hip Abduction with Counter Support  - 2 x daily - 7 x weekly - 2 sets - 10 reps - Standing Hip Extension with Counter Support  - 2 x daily - 7 x weekly - 2 sets - 10 reps - Seated Long Arc Quad  - 2 x daily - 7 x weekly - 2 sets - 10 reps   - Mini Squat with Counter Support  - 2 x daily - 7 x weekly - 2 sets - 10 reps  ASSESSMENT:  CLINICAL IMPRESSION: Began session with review of goals. Pt and wife reports understanding and agreeable. Followed with 2 minute walk test. Pt has to take 3 standing rest breaks throughout, reports inc back pain and demo SOB, dyspnea. HR taken at end of test: 121 bpm. HR after a few minutes rest lowers to 104 bpm. Wife reports HR usually in low 90s. Reviewed HEP, standing hip ext and abduction. Sit to stand bothering low back and LAQ bothering L knee. Sit to stand taken out of HEP as of now. Replaced with mini squats. Pt required seated rest breaks between each set of exercise. About 7 minutes rest at end of session with cold pack around neck due to hot flash. HR: 113. Session ended due to elevated HR. Reports he goes to see cardiologist tomorrow. Encouraged to mention at MD appointment. Patient will benefit from continued skilled physical therapy in order to address strength, balance, gait, and endurance deficits to improve function/quality of life.       EVAL: Patient is a 73 y.o. male who was seen today for physical therapy evaluation and treatment for  cancer related pain  . On this date, patient demonstrates decreased self perception of function, decreased LE strength, decreased endurance, impaired posture, decreased impaired balance, and general all over pain, all of which may be contributing to patients altered gait pattern, difficulty with functional transfers, decreased activity tolerance, and increased fall risk. Patient will benefit from skilled physical therapy in order to address the above/below deficits in order to improve gait, balance, strength, endurance,  transfers, and pain to improve quality of life and function.    OBJECTIVE IMPAIRMENTS: Abnormal gait, decreased activity tolerance, decreased balance, decreased coordination, decreased endurance, decreased mobility, difficulty walking, decreased ROM, decreased strength, impaired flexibility, improper body mechanics, postural dysfunction, and pain.   ACTIVITY LIMITATIONS: carrying, lifting, bending, sitting, standing, squatting, stairs, transfers, bed mobility, bathing, dressing, and reach over head  PARTICIPATION LIMITATIONS: meal prep, cleaning, laundry, community activity, and yard work  PERSONAL FACTORS: Time since onset of injury/illness/exacerbation and 1-2 comorbidities: See above are also affecting patient's functional outcome.   REHAB POTENTIAL: Fair    CLINICAL DECISION MAKING: Stable/uncomplicated  EVALUATION COMPLEXITY: Low   GOALS: Goals reviewed with patient? Yes  SHORT TERM GOALS: Target date: 03/08/24 Patient will be independent with performance of HEP to demonstrate adequate self management of symptoms.  Baseline:  Goal status: INITIAL  2.   Patient will report at least a 25% improvement with function and/or pain reduction overall since beginning PT. Baseline:  Goal status: INITIAL;   LONG TERM GOALS: Target date: 04/12/24 Patient will improve LEFS score by 9 points to demonstrate improved perceived function while meeting MCID.  Baseline: Goal status: INITIAL 2.  Patient will improve TUG score to 12 seconds or less in order to demonstrate decreased risk of falls.   Baseline:  Goal status: INITIAL 3.  Patient will improve  BLE MMT testing by at least 1/2 a grade to demonstrate increased LE strength and/or power needed to improve functional tranfers. Baseline:  Goal status: INITIAL   4.  Patient will increase 2 minute walk test gait distance to 20 ft with least restrictive assistive device to demonstrate improved endurance and functional mobility needed for  ambulating household and community distances.  Baseline: Goal status: INITIAL  5. Patient will report pain rating of 6/10 or less following performance of ADLs in order to demonstrate progress towards independence with everyday activity. Baseline: Goal status: INITIAL    PLAN:  PT FREQUENCY: 2x/week  PT DURATION: 8 weeks  PLANNED INTERVENTIONS: 97164- PT Re-evaluation, 97110-Therapeutic exercises, 97530- Therapeutic activity, 97112- Neuromuscular re-education, 97535- Self Care, [id number]- Manual therapy, [phone]- Gait training, [phone]- Traction (mechanical), Patient/Family education, Balance training, Stair training, Taping, Joint mobilization, Spinal mobilization, and Cryotherapy  PLAN FOR NEXT SESSION: take HR and/or BP prior to start of session, general mobility, general strengthening, endurance, address balance, pt wife mentioned possibly being trained on body mechanics when assisting patient on his bad days   3:02 PM, 02/24/24 Marquetta Weiskopf Powell-Butler, PT, DPT [facility] [place]

## 2024-02-26 ENCOUNTER — Encounter (HOSPITAL_COMMUNITY): Payer: Self-pay

## 2024-02-26 ENCOUNTER — Ambulatory Visit (HOSPITAL_COMMUNITY)

## 2024-02-26 DIAGNOSIS — Z7409 Other reduced mobility: Secondary | ICD-10-CM | POA: Diagnosis not present

## 2024-02-26 DIAGNOSIS — M6281 Muscle weakness (generalized): Secondary | ICD-10-CM

## 2024-02-26 DIAGNOSIS — R5381 Other malaise: Secondary | ICD-10-CM

## 2024-02-26 NOTE — Therapy (Signed)
 OUTPATIENT PHYSICAL THERAPY LOWER EXTREMITY TREATMENT   Patient Name: [name] MRN: [id number] DOB:07/28/1950, 73 y.o., male Today's Date: 02/26/2024  END OF SESSION:  PT End of Session - 02/26/24 1120     Visit Number 3    Number of Visits 16    Date for Recertification  03/15/24    Authorization Type Medicare Part A and B    Authorization Time Period no auth    Progress Note Due on Visit 10    PT Start Time 1120    PT Stop Time 1201    PT Time Calculation (min) 41 min    Activity Tolerance Patient tolerated treatment well;Patient limited by fatigue    Behavior During Therapy [facility] for tasks assessed/performed          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   Coronary artery disease    mild by 09/08/13 cath [facility]   GERD (gastroesophageal reflux disease)    barretts esophagus   H/O hiatal hernia    Headache(784.0)    Hypercholesteremia    Hypertension    PONV (postoperative nausea and vomiting)    trouble breathing after last surgery (at [facility])   Shingles    10   Sleep apnea    not tested   Tumor liver    no problems   Past Surgical History:  Procedure Laterality Date   ABDOMINAL SURGERY     x3   back surgeries     x 9   BACK SURGERY     x8   BIOPSY N/A 03/28/2013   Procedure: Distal Esophageal Biopsy;  Surgeon: Claudis RAYMOND Rivet, MD;  Location: [facility];  Service: Endoscopy;  Laterality: N/A;   BIOPSY  05/30/2016   Procedure: BIOPSY;  Surgeon: Claudis RAYMOND Rivet, MD;  [facility];  Service: Endoscopy;;   BIOPSY  02/19/2018   Procedure: BIOPSY;  Surgeon: Rivet Claudis RAYMOND, MD;  [facility];  Service: Endoscopy;;  Barret's esophagus   BIOPSY  08/29/2020   Procedure: BIOPSY;  Surgeon: Rivet Claudis RAYMOND, MD;  [facility];  Service: Endoscopy;;   BIOPSY  12/31/2021   Procedure: BIOPSY;  Surgeon: Eartha Angelia Sieving, MD;  [facility];  Service: Gastroenterology;;   CARDIAC CATHETERIZATION  2020   CHEST SURGERY     lung incisional hernia for hernia   COLONOSCOPY WITH PROPOFOL  N/A 02/19/2018   Procedure: COLONOSCOPY WITH PROPOFOL ;  Surgeon: Rivet Claudis RAYMOND, MD;  [facility];  Service: Endoscopy;  Laterality: N/A;  1:00   CORONARY ANGIOPLASTY WITH STENT PLACEMENT  2020   2 blockages: 1 stent   ESOPHAGOGASTRODUODENOSCOPY (EGD) WITH PROPOFOL  N/A 03/28/2013   Procedure: ESOPHAGOGASTRODUODENOSCOPY (EGD) WITH PROPOFOL ;  Surgeon: Claudis RAYMOND Rivet, MD;  Location: [facility];  Service: Endoscopy;  Laterality: N/A;  GE junction @ 38, proximal  margin @37    ESOPHAGOGASTRODUODENOSCOPY (EGD) WITH PROPOFOL  N/A 05/30/2016   Procedure: ESOPHAGOGASTRODUODENOSCOPY (EGD) WITH PROPOFOL ;  Surgeon: Claudis RAYMOND Rivet, MD;  [facility];  Service: Endoscopy;  Laterality: N/A;  955   ESOPHAGOGASTRODUODENOSCOPY (EGD) WITH PROPOFOL  N/A 02/19/2018   Procedure: ESOPHAGOGASTRODUODENOSCOPY (EGD) WITH PROPOFOL ;  Surgeon:  Golda Claudis PENNER, MD;  [facility];  Service: Endoscopy;  Laterality: N/A;   ESOPHAGOGASTRODUODENOSCOPY (EGD) WITH PROPOFOL  N/A 08/29/2020   Procedure: ESOPHAGOGASTRODUODENOSCOPY (EGD) WITH PROPOFOL ;  Surgeon: Golda Claudis PENNER, MD;  [facility];  Service: Endoscopy;  Laterality: N/A;  AM   ESOPHAGOGASTRODUODENOSCOPY (EGD) WITH PROPOFOL  N/A 12/31/2021   Procedure: ESOPHAGOGASTRODUODENOSCOPY (EGD) WITH PROPOFOL ;  Surgeon: Eartha Angelia Sieving, MD;  [facility];  Service: Gastroenterology;  Laterality: N/A;  100 ASA 2   HEMORRHOID SURGERY     x2   HERNIA REPAIR     HIATAL HERNIA REPAIR     JOINT REPLACEMENT     lft knee   KNEE ARTHROPLASTY     lft   KNEE ARTHROSCOPY Right    LEG SURGERY     x4 crushed    LITHOTRIPSY     MELANOMA EXCISION      lft abdomen   NECK SURGERY  2012   POLYPECTOMY  02/19/2018   Procedure: POLYPECTOMY;  Surgeon: Golda Claudis PENNER, MD;  [facility];  Service: Endoscopy;;  proximal transverse colon (CB x1), descending colon (CBx3)   POSTERIOR CERVICAL FUSION/FORAMINOTOMY  08/18/2011   Procedure: POSTERIOR CERVICAL FUSION/FORAMINOTOMY LEVEL 3;  Surgeon: Reyes JONETTA Budge, MD;  [facility]: [facility];  Service: Neurosurgery;  Laterality: N/A;  Cervical three-four, four-five, five-six posterior cervical  fusion with instrumentation; Right cervical three-four, four-five laminotomy   REVISION TOTAL HIP ARTHROPLASTY Left    SHOULDER ARTHROSCOPY     rt bith defect , spur, rotator cuff   SPINAL CORD STIMULATOR IMPLANT     rt hip   SPINAL CORD STIMULATOR INSERTION N/A 11/03/2016   Procedure: LUMBAR SPINAL CORD STIMULATOR REVISION;  Surgeon: Budge Reyes, MD;  Location: [facility];  Service: Neurosurgery;  Laterality: N/A;   THYROID  LOBECTOMY     rt   Patient Active Problem List   Diagnosis Date Noted   Right flank pain 02/22/2024   RLQ abdominal pain 02/22/2024   Hematuria 01/08/2023   History of pulmonary embolus (PE) 01/08/2023   Pancytopenia (HCC) 01/08/2023   Chronic kidney disease, stage 3a (HCC) 01/08/2023   Hyperlipidemia 01/08/2023   AAA (abdominal aortic aneurysm) 01/08/2023   Delirium    Acute metabolic encephalopathy 02/24/2022   Acute encephalopathy 02/23/2022   Sepsis due to undetermined organism (HCC) 02/23/2022   Volume depletion 02/12/2022   Acute respiratory failure with hypoxia (HCC) 02/12/2022   Paroxysmal atrial fibrillation (HCC) 02/12/2022   Multifocal pneumonia    GI bleeding 02/05/2022   IBS (irritable bowel syndrome) 11/18/2021   Acute pulmonary embolism (HCC) 05/05/2021   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   Fatty liver 08/14/2020   Acute ITP (HCC) 05/09/2020   Constipation 05/24/2019   Pancreatic calcification 05/24/2019   Gastroparesis 01/04/2019    N&V (nausea and vomiting) 11/17/2018   Debility 11/17/2018   Special screening for malignant neoplasms, colon 01/26/2018   Barrett's esophagus without dysplasia 01/26/2018   Thrombocytopenia 04/02/2017   Lumbar stenosis with neurogenic claudication 11/03/2016   Intractable vomiting with nausea 05/29/2016   OSA (obstructive sleep apnea) 11/18/2013   Liver lesion 03/01/2013   Barrett's esophagus 03/01/2013   GERD (gastroesophageal reflux disease) 03/01/2013   Essential hypertension, benign 03/01/2013   S/P lumbar fusion 03/25/2012   Back pain without radiation 03/25/2012    PCP: Bertell Satterfield, MD  REFERRING PROVIDER: Deruyter, Recardo Browning, FNP  REFERRING DIAG:  cancer related pain   THERAPY DIAG:  Physical deconditioning  Muscle weakness (generalized)  Impaired functional mobility, balance, gait, and endurance  Rationale for Evaluation and Treatment: Rehabilitation  ONSET DATE: First diagnosed 2007  SUBJECTIVE:   SUBJECTIVE STATEMENT: Pt reports he got sick after last visit. Reports today pain is around 8/10.  Hadn't tried new HEP as of yet.     EVAL: Patient arrives to PT for evaluation. Wife present throughout and contributes to history taking. Pt reports he has lymphoma and prostate cancer. Reports cancer has travelled to 5 bones at this point. Recent PSA score was 18. Did biopsies, revealed stage 4 cancer. Reports he just feels really weak, reports LE are really bad, got injection in L shoulder but hasn't helped as of yet. Reports he can't walk much, 1-2 minutes at most, some days are worse than others. Reports he also has back pain.   Pt actively taking chemo pills for cancer treatment. Pt allergic to demerol medication.   PERTINENT HISTORY: History of Present Illness  Ashton Belote is a 73 y.o. 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 He is accompanied by his  wife and his daughter.  He has been doing okay since his last visit. He has noticed ongoing fatigue, grade 1-2. He is not walking as much as before, though he was not very active at baseline and has had a fall after his legs gave out on him. He has ongoing joint and bone pain (L shoulder, R hip, bilateral knees/shins), which was been going on for some time. These sites of pain do not completely correlate to known disease. He has good appetite, eating and drinking well. Denies nausea, vomiting, diarrhea, or constipation. He has ongoing hot flashes, but finds these manageable with a fan. He denies changes to urination. He still has discoloration to his forehead, previously seen by derm.    Spinal cord Stimulator, inserted 2018 R knee arthroscopy  Previous back surgeries Neck surgery 2012  PAIN:  Are you having pain? Yes: NPRS scale: 8/10 Pain location: back, legs, shoulders  Pain description: Achy, sharp Aggravating factors: Pushing up with LUE, walking  Relieving factors: laying flat, or against back of couch in semi reclined position  PRECAUTIONS: Other: Active CA mets to bone; Avoid excessive repetitive motion, heavy end range movement, high impact activity, caution with ther ex machines, no heat modalities   RED FLAGS: N/T in toes since back surgeries, reports recent constipation, MD aware   WEIGHT BEARING RESTRICTIONS: No  FALLS:  Has patient fallen in last 6 months? Yes. Number of falls 4. Most recent one where pt thinks he tripped, landed on face.  LIVING ENVIRONMENT: Lives with: lives with their spouse Lives in: House/apartment Stairs: Yes: External: 3-4 steps; can reach both Has following equipment at home: Single point cane, Walker - 2 wheeled, Wheelchair (manual), shower chair, bed side commode, Grab bars, and Scooter. Uses all as needed  OCCUPATION: N/A  PLOF: Needs assistance with ADLs, assist needed for drying after bathing, assist with iADLs.   PATIENT GOALS: : to  work on leg strength, balance, decrease risk of falls, strengthen arms, and endurance  NEXT MD VISIT: every 3 months, may be around Nov/Dec 2025 fro next follow up  OBJECTIVE:  Note: Objective measures were completed at Evaluation unless otherwise noted.  DIAGNOSTIC FINDINGS:  N/A  PATIENT SURVEYS:  LEFS  Extreme difficulty/unable (0), Quite a bit of difficulty (1), Moderate difficulty (2), Little difficulty (3), No difficulty (4) Survey date:  Any of your usual work, housework or school activities   2. Usual hobbies, recreational or sporting activities   3. Getting into/out of the bath   4. Walking between rooms   5. Putting on socks/shoes   6. Squatting    7. Lifting an object, like a bag of groceries from the floor   8. Performing light activities around your home   9. Performing heavy activities around your home   10. Getting into/out of a car   11. Walking 2 blocks   12. Walking 1 mile   13. Going up/down 10 stairs (1 flight)   14. Standing for 1 hour   15.  sitting for 1 hour   16. Running on even ground   17. Running on uneven ground   18. Making sharp turns while running fast   19. Hopping    20. Rolling over in bed   Score total:  Lower Extremity Functional Score: 27 / 80 = 33.8 %      COGNITION: Overall cognitive status: Within functional limits for tasks assessed     SENSATION: WFL   POSTURE: rounded shoulders, forward head, and increased thoracic kyphosis   LOWER EXTREMITY ROM:  Active ROM Right eval Left eval  Hip flexion    Hip extension    Hip abduction    Hip adduction    Hip internal rotation    Hip external rotation    Knee flexion    Knee extension    Ankle dorsiflexion    Ankle plantarflexion    Ankle inversion    Ankle eversion     (Blank rows = not tested)  LOWER EXTREMITY MMT:  MMT Right eval Left eval  Hip flexion 3+ 3+  Hip extension    Hip abduction 3+ 3+  Hip adduction 3+ 3+  Hip internal rotation    Hip  external rotation    Knee flexion    Knee extension    Ankle dorsiflexion 4 4  Ankle plantarflexion    Ankle inversion    Ankle eversion     (Blank rows = not tested)  LOWER EXTREMITY SPECIAL TESTS:    FUNCTIONAL TESTS:  30 seconds chair stand test Timed up and go (TUG): 14 2 minute walk test: 308 ft, 3 standing breaks between, reports back pain 8-9/10.   30 seconds: 3.5 STS, push off with LUE, very slow and labored movement   Tandem Balance: RLE leading-3, LLE leading-4  SLS: 2 bilaterally  GAIT: Distance walked: 75-100 ft in session  Assistive device utilized: None Level of assistance: Complete Independence Comments: Shuffling feet, slight in-toeing of LLE, dec hip ext/flex bilat causing dec step length bilat, slight bend in knee throughout entire gait cycle, Forward lean, inc thoracic kyphosis, dec arm swing  TREATMENT DATE:  02/26/24: Vitals: BP: 98/82    HR: 102   SpO2: 94% LAQ, seated edge of mat, 15x emphasis on ecc control + 2 lb ankle weight,  15x Seated marching, 2 lb. AW Side Stepping at edge of mat table, 2 lb. AW, 10x, one rest break between   02/24/24: Review of goals 2 minute walk test, 3 standing rest breaks, reports inc back pain HR: 121 bpm HR after 5 min rest:104 bpm HR following 5 min rest Review of HEP: Standing hip ext, 10x each LE Standing hip abduction, 10x each LE Mini squats with counter support, 5x2 Cold pack donned around neck and seated rest at end of session, terminated early due to fatigue and HR (113 by end of seated rest)  02/16/24: PT Eval and HEP    PATIENT EDUCATION:  Education details: PT evaluation, objective findings, POC, Importance of HEP, Precautions, Clinic policies Person educated: Patient Education method: Explanation and Demonstration Education comprehension: verbalized understanding and  returned demonstration  HOME EXERCISE PROGRAM: Access Code: [email or web address] URL: [email or web address] Date: 02/16/2024 Prepared by: Rosaria Powell-Butler  Exercises - Standing Hip Abduction with Counter Support  - 2 x daily - 7 x weekly - 2 sets - 10 reps - Standing Hip Extension with Counter Support  - 2 x daily - 7 x weekly - 2 sets - 10 reps - Seated Long Arc Quad  - 2 x daily - 7 x weekly - 2 sets - 10 reps - Mini Squat with Counter Support  - 2 x daily - 7 x weekly - 2 sets - 10 reps  Access Code: [email or web address] URL: [email or web address] Date: 02/26/2024 Prepared by: Rosaria Powell-Butler  Exercises - Supine Bridge  - 2 x daily - 7 x weekly - 2 sets - 10 reps - Supine Hip Adduction Isometric with Ball  - 2 x daily - 7 x weekly - 2 sets - 10 reps - 5 hold - Hooklying Clamshell with Resistance  - 2 x daily - 7 x weekly - 2 sets - 10 reps - 5 hold - Supine Transversus Abdominis Bracing - Hands on Stomach  - 2 x daily - 7 x weekly - 2 sets - 10 reps - 5 hold  ASSESSMENT:  CLINICAL IMPRESSION: Pt arrives to session with reports of increased pain throughout, about 8/10. Pt reports getting sick after last session and missing cardiologist appointment next day due to this. Vitals taken prior to start of session can be viewed above. Began with seated LE exercises due to increased HR. Pt tolerated well. Added in standing exercises, with pt requiring seated rest breaks between. Pt reports feeling light headed at end of side steps. Requests to lie flat. BP taken: 124/98. Remainder of session spent in supine focusing on hip and core strength. Vitals at end of session: BP- 118/79, HR 101, SpO2-93%. Patient will benefit from continued skilled physical therapy in order to address strength, balance, gait, and endurance deficits to improve function/quality of life.        EVAL: Patient is a 73 y.o. male who was seen today for physical therapy evaluation and treatment for  cancer  related pain  . On this date, patient demonstrates decreased self perception of function, decreased LE strength, decreased endurance, impaired posture, decreased impaired balance, and general all over pain, all of which may be contributing to patients altered gait pattern, difficulty with functional transfers, decreased activity tolerance, and increased fall risk. Patient will benefit from skilled physical therapy in order to address  the above/below deficits in order to improve gait, balance, strength, endurance, transfers, and pain to improve quality of life and function.    OBJECTIVE IMPAIRMENTS: Abnormal gait, decreased activity tolerance, decreased balance, decreased coordination, decreased endurance, decreased mobility, difficulty walking, decreased ROM, decreased strength, impaired flexibility, improper body mechanics, postural dysfunction, and pain.   ACTIVITY LIMITATIONS: carrying, lifting, bending, sitting, standing, squatting, stairs, transfers, bed mobility, bathing, dressing, and reach over head  PARTICIPATION LIMITATIONS: meal prep, cleaning, laundry, community activity, and yard work  PERSONAL FACTORS: Time since onset of injury/illness/exacerbation and 1-2 comorbidities: See above are also affecting patient's functional outcome.   REHAB POTENTIAL: Fair    CLINICAL DECISION MAKING: Stable/uncomplicated  EVALUATION COMPLEXITY: Low   GOALS: Goals reviewed with patient? Yes  SHORT TERM GOALS: Target date: 03/08/24 Patient will be independent with performance of HEP to demonstrate adequate self management of symptoms.  Baseline:  Goal status: INITIAL  2.   Patient will report at least a 25% improvement with function and/or pain reduction overall since beginning PT. Baseline:  Goal status: INITIAL;   LONG TERM GOALS: Target date: 04/12/24 Patient will improve LEFS score by 9 points to demonstrate improved perceived function while meeting MCID.  Baseline: Goal status:  INITIAL 2.  Patient will improve TUG score to 12 seconds or less in order to demonstrate decreased risk of falls.   Baseline:  Goal status: INITIAL 3.  Patient will improve  BLE MMT testing by at least 1/2 a grade to demonstrate increased LE strength and/or power needed to improve functional tranfers. Baseline:  Goal status: INITIAL   4.  Patient will increase 2 minute walk test gait distance to 20 ft with least restrictive assistive device to demonstrate improved endurance and functional mobility needed for ambulating household and community distances.  Baseline: Goal status: INITIAL  5. Patient will report pain rating of 6/10 or less following performance of ADLs in order to demonstrate progress towards independence with everyday activity. Baseline: Goal status: INITIAL    PLAN:  PT FREQUENCY: 2x/week  PT DURATION: 8 weeks  PLANNED INTERVENTIONS: 97164- PT Re-evaluation, 97110-Therapeutic exercises, 97530- Therapeutic activity, 97112- Neuromuscular re-education, 97535- Self Care, [id number]- Manual therapy, [phone]- Gait training, [phone]- Traction (mechanical), Patient/Family education, Balance training, Stair training, Taping, Joint mobilization, Spinal mobilization, and Cryotherapy  PLAN FOR NEXT SESSION: take HR and/or BP prior to start of session, general mobility, general strengthening, endurance, address balance, pt wife mentioned possibly being trained on body mechanics when assisting patient on his bad days   12:19 PM, 02/26/24 Isami Mehra Powell-Butler, PT, DPT [facility]

## 2024-03-01 ENCOUNTER — Ambulatory Visit (HOSPITAL_COMMUNITY)

## 2024-03-01 ENCOUNTER — Encounter (HOSPITAL_COMMUNITY): Payer: Self-pay

## 2024-03-01 DIAGNOSIS — M6281 Muscle weakness (generalized): Secondary | ICD-10-CM

## 2024-03-01 DIAGNOSIS — Z7409 Other reduced mobility: Secondary | ICD-10-CM

## 2024-03-01 DIAGNOSIS — R5381 Other malaise: Secondary | ICD-10-CM

## 2024-03-01 NOTE — Therapy (Signed)
 OUTPATIENT PHYSICAL THERAPY LOWER EXTREMITY TREATMENT   Patient Name: [name] MRN: [id number] DOB:[birth year], 73 y.o., male Today's Date: 03/01/2024  END OF SESSION:  PT End of Session - 03/01/24 0953     Visit Number 4    Number of Visits 16    Date for Recertification  03/15/24    Authorization Type Medicare Part A and B    Authorization Time Period no auth    Progress Note Due on Visit 10    PT Start Time [phone]    PT Stop Time 1030    PT Time Calculation (min) 42 min    Activity Tolerance Patient tolerated treatment well;Patient limited by fatigue    Behavior During Therapy [facility] for tasks assessed/performed          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   Coronary artery disease    mild by 09/08/13 cath [facility]   GERD (gastroesophageal reflux disease)    barretts esophagus   H/O hiatal hernia    Headache(784.0)    Hypercholesteremia    Hypertension    PONV (postoperative nausea and vomiting)    trouble breathing after last surgery (at [facility])   Shingles    10   Sleep apnea    not tested   Tumor liver    no problems   Past Surgical History:  Procedure Laterality Date   ABDOMINAL SURGERY     x3   back surgeries     x 9   BACK SURGERY     x8   BIOPSY N/A 03/28/2013   Procedure: Distal Esophageal Biopsy;  Surgeon: Claudis RAYMOND Rivet, MD;  Location: [facility];  Service: Endoscopy;  Laterality: N/A;   BIOPSY  05/30/2016   Procedure: BIOPSY;  Surgeon: Claudis RAYMOND Rivet, MD;  [facility];  Service: Endoscopy;;   BIOPSY  02/19/2018   Procedure: BIOPSY;  Surgeon: Rivet Claudis RAYMOND, MD;  [facility];  Service: Endoscopy;;  Barret's esophagus   BIOPSY  08/29/2020   Procedure: BIOPSY;  Surgeon: Rivet Claudis RAYMOND, MD;  [facility];  Service: Endoscopy;;   BIOPSY  12/31/2021   Procedure: BIOPSY;  Surgeon: Eartha Angelia Sieving, MD;  [facility];  Service: Gastroenterology;;   CARDIAC CATHETERIZATION  2020   CHEST SURGERY     lung incisional hernia for hernia   COLONOSCOPY WITH PROPOFOL  N/A 02/19/2018   Procedure: COLONOSCOPY WITH PROPOFOL ;  Surgeon: Rivet Claudis RAYMOND, MD;  [facility];  Service: Endoscopy;  Laterality: N/A;  1:00   CORONARY ANGIOPLASTY WITH STENT PLACEMENT  2020   2 blockages: 1 stent   ESOPHAGOGASTRODUODENOSCOPY (EGD) WITH PROPOFOL  N/A 03/28/2013   Procedure: ESOPHAGOGASTRODUODENOSCOPY (EGD) WITH PROPOFOL ;  Surgeon: Claudis RAYMOND Rivet, MD;  Location: [facility];  Service: Endoscopy;  Laterality: N/A;  GE junction @ 38, proximal  margin @37    ESOPHAGOGASTRODUODENOSCOPY (EGD) WITH PROPOFOL  N/A 05/30/2016   Procedure: ESOPHAGOGASTRODUODENOSCOPY (EGD) WITH PROPOFOL ;  Surgeon: Claudis RAYMOND Rivet, MD;  [facility];  Service: Endoscopy;  Laterality: N/A;  955   ESOPHAGOGASTRODUODENOSCOPY (EGD) WITH PROPOFOL  N/A 02/19/2018   Procedure: ESOPHAGOGASTRODUODENOSCOPY (EGD) WITH PROPOFOL ;  Surgeon:  Golda Claudis PENNER, MD;  [facility];  Service: Endoscopy;  Laterality: N/A;   ESOPHAGOGASTRODUODENOSCOPY (EGD) WITH PROPOFOL  N/A 08/29/2020   Procedure: ESOPHAGOGASTRODUODENOSCOPY (EGD) WITH PROPOFOL ;  Surgeon: Golda Claudis PENNER, MD;  [facility];  Service: Endoscopy;  Laterality: N/A;  AM   ESOPHAGOGASTRODUODENOSCOPY (EGD) WITH PROPOFOL  N/A 12/31/2021   Procedure: ESOPHAGOGASTRODUODENOSCOPY (EGD) WITH PROPOFOL ;  Surgeon: Eartha Angelia Sieving, MD;  [facility];  Service: Gastroenterology;  Laterality: N/A;  100 ASA 2   HEMORRHOID SURGERY     x2   HERNIA REPAIR     HIATAL HERNIA REPAIR     JOINT REPLACEMENT     lft knee   KNEE ARTHROPLASTY     lft   KNEE ARTHROSCOPY Right    LEG SURGERY     x4 crushed    LITHOTRIPSY     MELANOMA EXCISION      lft abdomen   NECK SURGERY  2012   POLYPECTOMY  02/19/2018   Procedure: POLYPECTOMY;  Surgeon: Golda Claudis PENNER, MD;  [facility];  Service: Endoscopy;;  proximal transverse colon (CB x1), descending colon (CBx3)   POSTERIOR CERVICAL FUSION/FORAMINOTOMY  08/18/2011   Procedure: POSTERIOR CERVICAL FUSION/FORAMINOTOMY LEVEL 3;  Surgeon: Reyes JONETTA Budge, MD;  [facility]: [facility];  Service: Neurosurgery;  Laterality: N/A;  Cervical three-four, four-five, five-six posterior cervical  fusion with instrumentation; Right cervical three-four, four-five laminotomy   REVISION TOTAL HIP ARTHROPLASTY Left    SHOULDER ARTHROSCOPY     rt bith defect , spur, rotator cuff   SPINAL CORD STIMULATOR IMPLANT     rt hip   SPINAL CORD STIMULATOR INSERTION N/A 11/03/2016   Procedure: LUMBAR SPINAL CORD STIMULATOR REVISION;  Surgeon: Budge Reyes, MD;  Location: [facility];  Service: Neurosurgery;  Laterality: N/A;   THYROID  LOBECTOMY     rt   Patient Active Problem List   Diagnosis Date Noted   Right flank pain 02/22/2024   RLQ abdominal pain 02/22/2024   Hematuria 01/08/2023   History of pulmonary embolus (PE) 01/08/2023   Pancytopenia (HCC) 01/08/2023   Chronic kidney disease, stage 3a (HCC) 01/08/2023   Hyperlipidemia 01/08/2023   AAA (abdominal aortic aneurysm) 01/08/2023   Delirium    Acute metabolic encephalopathy 02/24/2022   Acute encephalopathy 02/23/2022   Sepsis due to undetermined organism (HCC) 02/23/2022   Volume depletion 02/12/2022   Acute respiratory failure with hypoxia (HCC) 02/12/2022   Paroxysmal atrial fibrillation (HCC) 02/12/2022   Multifocal pneumonia    GI bleeding 02/05/2022   IBS (irritable bowel syndrome) 11/18/2021   Acute pulmonary embolism (HCC) 05/05/2021   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   Fatty liver 08/14/2020   Acute ITP (HCC) 05/09/2020   Constipation 05/24/2019   Pancreatic calcification 05/24/2019   Gastroparesis 01/04/2019    N&V (nausea and vomiting) 11/17/2018   Debility 11/17/2018   Special screening for malignant neoplasms, colon 01/26/2018   Barrett's esophagus without dysplasia 01/26/2018   Thrombocytopenia 04/02/2017   Lumbar stenosis with neurogenic claudication 11/03/2016   Intractable vomiting with nausea 05/29/2016   OSA (obstructive sleep apnea) 11/18/2013   Liver lesion 03/01/2013   Barrett's esophagus 03/01/2013   GERD (gastroesophageal reflux disease) 03/01/2013   Essential hypertension, benign 03/01/2013   S/P lumbar fusion 03/25/2012   Back pain without radiation 03/25/2012    PCP: Bertell Satterfield, MD  REFERRING PROVIDER: Deruyter, Recardo Browning, FNP  REFERRING DIAG:  cancer related pain   THERAPY DIAG:  Physical deconditioning  Muscle weakness (generalized)  Impaired functional mobility, balance, gait, and endurance  Rationale for Evaluation and Treatment: Rehabilitation  ONSET DATE: First diagnosed 2007  SUBJECTIVE:   SUBJECTIVE STATEMENT: Vertigo returned on Sunday, has been dealing with daily.  Has taken medicaition for dizziness today.  Current pain scale 7/10 with main pain in back, shoulder and legs today.    EVAL: Patient arrives to PT for evaluation. Wife present throughout and contributes to history taking. Pt reports he has lymphoma and prostate cancer. Reports cancer has travelled to 5 bones at this point. Recent PSA score was 18. Did biopsies, revealed stage 4 cancer. Reports he just feels really weak, reports LE are really bad, got injection in L shoulder but hasn't helped as of yet. Reports he can't walk much, 1-2 minutes at most, some days are worse than others. Reports he also has back pain.   Pt actively taking chemo pills for cancer treatment. Pt allergic to demerol medication.   PERTINENT HISTORY: History of Present Illness  Abayomi Pattison is a 73 y.o. 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 He is accompanied by his wife and his daughter.  He has been doing okay since his last visit. He has noticed ongoing fatigue, grade 1-2. He is not walking as much as before, though he was not very active at baseline and has had a fall after his legs gave out on him. He has ongoing joint and bone pain (L shoulder, R hip, bilateral knees/shins), which was been going on for some time. These sites of pain do not completely correlate to known disease. He has good appetite, eating and drinking well. Denies nausea, vomiting, diarrhea, or constipation. He has ongoing hot flashes, but finds these manageable with a fan. He denies changes to urination. He still has discoloration to his forehead, previously seen by derm.    Spinal cord Stimulator, inserted 2018 R knee arthroscopy  Previous back surgeries Neck surgery 2012  PAIN:  Are you having pain? Yes: NPRS scale: 8/10 Pain location: back, legs, shoulders  Pain description: Achy, sharp Aggravating factors: Pushing up with LUE, walking  Relieving factors: laying flat, or against back of couch in semi reclined position  PRECAUTIONS: Other: Active CA mets to bone; Avoid excessive repetitive motion, heavy end range movement, high impact activity, caution with ther ex machines, no heat modalities   RED FLAGS: N/T in toes since back surgeries, reports recent constipation, MD aware   WEIGHT BEARING RESTRICTIONS: No  FALLS:  Has patient fallen in last 6 months? Yes. Number of falls 4. Most recent one where pt thinks he tripped, landed on face.  LIVING ENVIRONMENT: Lives with: lives with their spouse Lives in: House/apartment Stairs: Yes: External: 3-4 steps; can reach both Has following equipment at home: Single point cane, Walker - 2 wheeled, Wheelchair (manual), shower chair, bed side commode, Grab bars, and Scooter. Uses all as needed  OCCUPATION: N/A  PLOF: Needs assistance with ADLs, assist needed for  drying after bathing, assist with iADLs.   PATIENT GOALS: : to work on leg strength, balance, decrease risk of falls, strengthen arms, and endurance  NEXT MD VISIT: every 3 months, may be around Nov/Dec 2025 fro next follow up  OBJECTIVE:  Note: Objective measures were completed at Evaluation unless otherwise noted.  DIAGNOSTIC FINDINGS:  N/A  PATIENT SURVEYS:  LEFS  Extreme difficulty/unable (0), Quite a bit of difficulty (1), Moderate difficulty (2), Little  difficulty (3), No difficulty (4) Survey date:    Any of your usual work, housework or school activities   2. Usual hobbies, recreational or sporting activities   3. Getting into/out of the bath   4. Walking between rooms   5. Putting on socks/shoes   6. Squatting    7. Lifting an object, like a bag of groceries from the floor   8. Performing light activities around your home   9. Performing heavy activities around your home   10. Getting into/out of a car   11. Walking 2 blocks   12. Walking 1 mile   13. Going up/down 10 stairs (1 flight)   14. Standing for 1 hour   15.  sitting for 1 hour   16. Running on even ground   17. Running on uneven ground   18. Making sharp turns while running fast   19. Hopping    20. Rolling over in bed   Score total:  Lower Extremity Functional Score: 27 / 80 = 33.8 %      COGNITION: Overall cognitive status: Within functional limits for tasks assessed     SENSATION: WFL   POSTURE: rounded shoulders, forward head, and increased thoracic kyphosis   LOWER EXTREMITY ROM:  Active ROM Right eval Left eval  Hip flexion    Hip extension    Hip abduction    Hip adduction    Hip internal rotation    Hip external rotation    Knee flexion    Knee extension    Ankle dorsiflexion    Ankle plantarflexion    Ankle inversion    Ankle eversion     (Blank rows = not tested)  LOWER EXTREMITY MMT:  MMT Right eval Left eval  Hip flexion 3+ 3+  Hip extension    Hip abduction  3+ 3+  Hip adduction 3+ 3+  Hip internal rotation    Hip external rotation    Knee flexion    Knee extension    Ankle dorsiflexion 4 4  Ankle plantarflexion    Ankle inversion    Ankle eversion     (Blank rows = not tested)  LOWER EXTREMITY SPECIAL TESTS:    FUNCTIONAL TESTS:  30 seconds chair stand test Timed up and go (TUG): 14 2 minute walk test: 308 ft, 3 standing breaks between, reports back pain 8-9/10.   30 seconds: 3.5 STS, push off with LUE, very slow and labored movement   Tandem Balance: RLE leading-3, LLE leading-4  SLS: 2 bilaterally  GAIT: Distance walked: 75-100 ft in session  Assistive device utilized: None Level of assistance: Complete Independence Comments: Shuffling feet, slight in-toeing of LLE, dec hip ext/flex bilat causing dec step length bilat, slight bend in knee throughout entire gait cycle, Forward lean, inc thoracic kyphosis, dec arm swing  TREATMENT DATE:  03/01/24:  Vitals 108/77 mmHg, HR 69 - STS no HHA 12x eccentric control - Toe tapping 8in step height alternating minimal HHA - Lateral toe tapping between 6 and 8in step 10x - Tandem stance on floor 1x 30 - Tandem stance on foam 2x 30 with intermittent HHA  Supine:  Instructed log rolling (assistance required with bottom leg) - isometric adduction with ball  - isometric abduction into belt - SKTC 2x 30 with towel  02/26/24: Vitals: BP: 98/82    HR: 102   SpO2: 94% LAQ, seated edge of mat, 15x emphasis on ecc control + 2 lb ankle weight,  15x Seated marching, 2 lb. AW Side Stepping at edge of mat table, 2 lb. AW, 10x, one rest break between   02/24/24: Review of goals 2 minute walk test, 3 standing rest breaks, reports inc back pain HR: 121 bpm HR after 5 min rest:104 bpm HR following 5 min rest Review of HEP: Standing hip ext, 10x each LE Standing  hip abduction, 10x each LE Mini squats with counter support, 5x2 Cold pack donned around neck and seated rest at end of session, terminated early due to fatigue and HR (113 by end of seated rest)  02/16/24: PT Eval and HEP    PATIENT EDUCATION:  Education details: PT evaluation, objective findings, POC, Importance of HEP, Precautions, Clinic policies Person educated: Patient Education method: Explanation and Demonstration Education comprehension: verbalized understanding and returned demonstration  HOME EXERCISE PROGRAM: Access Code: [email or web address] URL: [email or web address] Date: 02/16/2024 Prepared by: Rosaria Powell-Butler  Exercises - Standing Hip Abduction with Counter Support  - 2 x daily - 7 x weekly - 2 sets - 10 reps - Standing Hip Extension with Counter Support  - 2 x daily - 7 x weekly - 2 sets - 10 reps - Seated Long Arc Quad  - 2 x daily - 7 x weekly - 2 sets - 10 reps - Mini Squat with Counter Support  - 2 x daily - 7 x weekly - 2 sets - 10 reps  Access Code: [email or web address] URL: [email or web address] Date: 02/26/2024 Prepared by: Rosaria Powell-Butler  Exercises - Supine Bridge  - 2 x daily - 7 x weekly - 2 sets - 10 reps - Supine Hip Adduction Isometric with Ball  - 2 x daily - 7 x weekly - 2 sets - 10 reps - 5 hold - Hooklying Clamshell with Resistance  - 2 x daily - 7 x weekly - 2 sets - 10 reps - 5 hold - Supine Transversus Abdominis Bracing - Hands on Stomach  - 2 x daily - 7 x weekly - 2 sets - 10 reps - 5 hold  Access Code: [email or web address] URL: [email or web address] Date: 03/01/2024 Prepared by: Augustin Mclean  Exercises - Standing Hip Abduction with Counter Support  - 2 x daily - 7 x weekly - 2 sets - 10 reps - Standing Hip Extension with Counter Support  - 2 x daily - 7 x weekly - 2 sets - 10 reps - Mini Squat with Counter Support  - 2 x daily - 7 x weekly - 2 sets - 10 reps - Seated Long Arc Quad  - 2 x daily - 7 x weekly - 2  sets - 10 reps - Supine Bridge  - 1 x daily - 7 x weekly - 3 sets - 10 reps - Supine Transversus Abdominis Bracing - Hands on Stomach  - 1 x daily - 7 x weekly - 3 sets -  10 reps - Supine Hip Adduction Isometric with Ball  - 1 x daily - 7 x weekly - 3 sets - 10 reps - Hooklying Single Knee to Chest Stretch  - 2 x daily - 7 x weekly - 1 sets - 3 reps - 30 hold - Hooklying Isometric Clamshell  - 1 x daily - 7 x weekly - 3 sets - 10 reps  ASSESSMENT:  CLINICAL IMPRESSION: Began session checking vitals, within appropriate range for session.  Pt limited by fatigue and c/o lower back pain that was monitored through session.  Standing exercises with balance and LE strengthening exercises.  Intermittent HHA required during balance activities to assist with SLS based activities.  Pt c/o increased pain back during standing and requested mat activities. Pt educated on proper bed mobility to reduce stress on lower back with some difficulty lifting bottom leg onto mat.  Isometric hip strengthening exercises complete for strengthening.  Added SKTC to assist with back mobility.  Reviewed current HEP, noted pt with multiple access codes.  Combined all exercises under one access code and added SKTC to assist with LBP and lumbar mobility.         EVAL: Patient is a 73 y.o. male who was seen today for physical therapy evaluation and treatment for  cancer related pain  . On this date, patient demonstrates decreased self perception of function, decreased LE strength, decreased endurance, impaired posture, decreased impaired balance, and general all over pain, all of which may be contributing to patients altered gait pattern, difficulty with functional transfers, decreased activity tolerance, and increased fall risk. Patient will benefit from skilled physical therapy in order to address the above/below deficits in order to improve gait, balance, strength, endurance, transfers, and pain to improve quality of life and  function.    OBJECTIVE IMPAIRMENTS: Abnormal gait, decreased activity tolerance, decreased balance, decreased coordination, decreased endurance, decreased mobility, difficulty walking, decreased ROM, decreased strength, impaired flexibility, improper body mechanics, postural dysfunction, and pain.   ACTIVITY LIMITATIONS: carrying, lifting, bending, sitting, standing, squatting, stairs, transfers, bed mobility, bathing, dressing, and reach over head  PARTICIPATION LIMITATIONS: meal prep, cleaning, laundry, community activity, and yard work  PERSONAL FACTORS: Time since onset of injury/illness/exacerbation and 1-2 comorbidities: See above are also affecting patient's functional outcome.   REHAB POTENTIAL: Fair    CLINICAL DECISION MAKING: Stable/uncomplicated  EVALUATION COMPLEXITY: Low   GOALS: Goals reviewed with patient? Yes  SHORT TERM GOALS: Target date: 03/08/24 Patient will be independent with performance of HEP to demonstrate adequate self management of symptoms.  Baseline:  Goal status: INITIAL  2.   Patient will report at least a 25% improvement with function and/or pain reduction overall since beginning PT. Baseline:  Goal status: INITIAL;   LONG TERM GOALS: Target date: 04/12/24 Patient will improve LEFS score by 9 points to demonstrate improved perceived function while meeting MCID.  Baseline: Goal status: INITIAL 2.  Patient will improve TUG score to 12 seconds or less in order to demonstrate decreased risk of falls.   Baseline:  Goal status: INITIAL 3.  Patient will improve  BLE MMT testing by at least 1/2 a grade to demonstrate increased LE strength and/or power needed to improve functional tranfers. Baseline:  Goal status: INITIAL   4.  Patient will increase 2 minute walk test gait distance to 20 ft with least restrictive assistive device to demonstrate improved endurance and functional mobility needed for ambulating household and community distances.   Baseline: Goal status: INITIAL  5.  Patient will report pain rating of 6/10 or less following performance of ADLs in order to demonstrate progress towards independence with everyday activity. Baseline: Goal status: INITIAL    PLAN:  PT FREQUENCY: 2x/week  PT DURATION: 8 weeks  PLANNED INTERVENTIONS: 97164- PT Re-evaluation, 97110-Therapeutic exercises, 97530- Therapeutic activity, 97112- Neuromuscular re-education, 97535- Self Care, [id number]- Manual therapy, [phone]- Gait training, [phone]- Traction (mechanical), Patient/Family education, Balance training, Stair training, Taping, Joint mobilization, Spinal mobilization, and Cryotherapy  PLAN FOR NEXT SESSION: take HR and/or BP prior to start of session, general mobility, general strengthening, endurance, address balance, pt wife mentioned possibly being trained on body mechanics when assisting patient on his bad days  Augustin Mclean, LPTA/CLT; CBIS [phone]  2:12 PM, 03/01/24

## 2024-03-03 ENCOUNTER — Telehealth (HOSPITAL_COMMUNITY): Payer: Self-pay

## 2024-03-03 ENCOUNTER — Encounter (HOSPITAL_COMMUNITY)

## 2024-03-03 NOTE — Telephone Encounter (Signed)
 No show #1, called and spoke to wife who stated [name] had difficulty breathing yesterday and rough night sleep. Wife educated on no show policy and reviewed next apt date and time wiht contact number included if needs to cancel/reschedule in the future.   Augustin Mclean, LPTA/CLT; WILLAIM [phone]

## 2024-03-04 ENCOUNTER — Ambulatory Visit (HOSPITAL_COMMUNITY)
Admission: RE | Admit: 2024-03-04 | Discharge: 2024-03-04 | Disposition: A | Discharge status: Home / Self Care | Source: Ambulatory Visit | Attending: Gastroenterology | Admitting: Gastroenterology

## 2024-03-04 DIAGNOSIS — R10A1 Flank pain, right side: Secondary | ICD-10-CM | POA: Insufficient documentation

## 2024-03-04 DIAGNOSIS — I700 Atherosclerosis of aorta: Secondary | ICD-10-CM | POA: Diagnosis not present

## 2024-03-04 DIAGNOSIS — R1031 Right lower quadrant pain: Secondary | ICD-10-CM | POA: Insufficient documentation

## 2024-03-04 DIAGNOSIS — R112 Nausea with vomiting, unspecified: Secondary | ICD-10-CM | POA: Insufficient documentation

## 2024-03-04 DIAGNOSIS — K449 Diaphragmatic hernia without obstruction or gangrene: Secondary | ICD-10-CM | POA: Diagnosis not present

## 2024-03-04 DIAGNOSIS — K4090 Unilateral inguinal hernia, without obstruction or gangrene, not specified as recurrent: Secondary | ICD-10-CM | POA: Diagnosis not present

## 2024-03-04 MED ORDER — IOHEXOL 300 MG/ML  SOLN
100.0000 mL | Freq: Once | INTRAMUSCULAR | Status: AC | PRN
  Administered 2024-03-04: 100 mL via INTRAVENOUS

## 2024-03-07 ENCOUNTER — Ambulatory Visit (INDEPENDENT_AMBULATORY_CARE_PROVIDER_SITE_OTHER): Payer: Self-pay | Admitting: Gastroenterology

## 2024-03-08 ENCOUNTER — Ambulatory Visit (HOSPITAL_COMMUNITY): Attending: Medical Oncology

## 2024-03-08 ENCOUNTER — Encounter (HOSPITAL_COMMUNITY): Payer: Self-pay

## 2024-03-08 DIAGNOSIS — Z7409 Other reduced mobility: Secondary | ICD-10-CM | POA: Diagnosis present

## 2024-03-08 DIAGNOSIS — R5381 Other malaise: Secondary | ICD-10-CM | POA: Diagnosis present

## 2024-03-08 DIAGNOSIS — M6281 Muscle weakness (generalized): Secondary | ICD-10-CM | POA: Diagnosis present

## 2024-03-08 LAB — POCT I-STAT CREATININE: Creatinine, Ser: 1.3 mg/dL — ABNORMAL HIGH (ref 0.61–1.24)

## 2024-03-08 NOTE — Therapy (Signed)
 OUTPATIENT PHYSICAL THERAPY LOWER EXTREMITY TREATMENT   Patient Name: [name] MRN: [id number] DOB:Oct 13, 1950, 73 y.o., male Today's Date: 03/08/2024  END OF SESSION:  PT End of Session - 03/08/24 1248     Visit Number 5    Number of Visits 16    Date for Recertification  03/15/24    Authorization Type Medicare Part A and B    Authorization Time Period no auth    Progress Note Due on Visit 10    PT Start Time 1248    PT Stop Time 1326    PT Time Calculation (min) 38 min    Activity Tolerance Patient tolerated treatment well;Patient limited by fatigue   limited by dizziness, orthostatic hypotension s/s   Behavior During Therapy [facility] for tasks assessed/performed          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   Coronary artery disease    mild by 09/08/13 cath [facility]   GERD (gastroesophageal reflux disease)    barretts esophagus   H/O hiatal hernia    Headache(784.0)    Hypercholesteremia    Hypertension    PONV (postoperative nausea and vomiting)    trouble breathing after last surgery (at [facility])   Shingles    10   Sleep apnea    not tested   Tumor liver    no problems   Past Surgical History:  Procedure Laterality Date   ABDOMINAL SURGERY     x3   back surgeries     x 9   BACK SURGERY     x8   BIOPSY N/A 03/28/2013   Procedure: Distal Esophageal Biopsy;  Surgeon: Claudis RAYMOND Rivet, MD;  Location: [facility];  Service: Endoscopy;  Laterality: N/A;   BIOPSY  05/30/2016   Procedure: BIOPSY;  Surgeon: Claudis RAYMOND Rivet, MD;  [facility]: [facility];  Service: Endoscopy;;   BIOPSY  02/19/2018   Procedure: BIOPSY;  Surgeon: Rivet Claudis RAYMOND, MD;  [facility]: [facility];  Service: Endoscopy;;  Barret's esophagus   BIOPSY  08/29/2020   Procedure:  BIOPSY;  Surgeon: Rivet Claudis RAYMOND, MD;  [facility];  Service: Endoscopy;;   BIOPSY  12/31/2021   Procedure: BIOPSY;  Surgeon: Eartha Angelia Sieving, MD;  [facility];  Service: Gastroenterology;;   CARDIAC CATHETERIZATION  2020   CHEST SURGERY     lung incisional hernia for hernia   COLONOSCOPY WITH PROPOFOL  N/A 02/19/2018   Procedure: COLONOSCOPY WITH PROPOFOL ;  Surgeon: Rivet Claudis RAYMOND, MD;  [facility];  Service: Endoscopy;  Laterality: N/A;  1:00   CORONARY ANGIOPLASTY WITH STENT PLACEMENT  2020   2 blockages: 1 stent   ESOPHAGOGASTRODUODENOSCOPY (EGD) WITH PROPOFOL  N/A 03/28/2013   Procedure: ESOPHAGOGASTRODUODENOSCOPY (EGD) WITH PROPOFOL ;  Surgeon: Claudis RAYMOND Rivet, MD;  Location: [facility];  Service: Endoscopy;  Laterality: N/A;  GE junction @ 38, proximal  margin @37    ESOPHAGOGASTRODUODENOSCOPY (EGD) WITH PROPOFOL  N/A 05/30/2016   Procedure: ESOPHAGOGASTRODUODENOSCOPY (EGD) WITH PROPOFOL ;  Surgeon: Claudis RAYMOND Rivet, MD;  [facility];  Service: Endoscopy;  Laterality: N/A;  955   ESOPHAGOGASTRODUODENOSCOPY (EGD) WITH PROPOFOL  N/A 02/19/2018  Procedure: ESOPHAGOGASTRODUODENOSCOPY (EGD) WITH PROPOFOL ;  Surgeon: Golda Claudis PENNER, MD;  [facility];  Service: Endoscopy;  Laterality: N/A;   ESOPHAGOGASTRODUODENOSCOPY (EGD) WITH PROPOFOL  N/A 08/29/2020   Procedure: ESOPHAGOGASTRODUODENOSCOPY (EGD) WITH PROPOFOL ;  Surgeon: Golda Claudis PENNER, MD;  [facility];  Service: Endoscopy;  Laterality: N/A;  AM   ESOPHAGOGASTRODUODENOSCOPY (EGD) WITH PROPOFOL  N/A 12/31/2021   Procedure: ESOPHAGOGASTRODUODENOSCOPY (EGD) WITH PROPOFOL ;  Surgeon: Eartha Angelia Sieving, MD;  [facility];  Service: Gastroenterology;  Laterality: N/A;  100 ASA 2   HEMORRHOID SURGERY     x2   HERNIA REPAIR     HIATAL HERNIA REPAIR     JOINT REPLACEMENT     lft knee   KNEE ARTHROPLASTY     lft   KNEE ARTHROSCOPY Right    LEG SURGERY     x4  crushed    LITHOTRIPSY     MELANOMA EXCISION     lft abdomen   NECK SURGERY  2012   POLYPECTOMY  02/19/2018   Procedure: POLYPECTOMY;  Surgeon: Golda Claudis PENNER, MD;  [facility];  Service: Endoscopy;;  proximal transverse colon (CB x1), descending colon (CBx3)   POSTERIOR CERVICAL FUSION/FORAMINOTOMY  08/18/2011   Procedure: POSTERIOR CERVICAL FUSION/FORAMINOTOMY LEVEL 3;  Surgeon: Reyes JONETTA Budge, MD;  Location: [facility];  Service: Neurosurgery;  Laterality: N/A;  Cervical three-four, four-five, five-six posterior cervical  fusion with instrumentation; Right cervical three-four, four-five laminotomy   REVISION TOTAL HIP ARTHROPLASTY Left    SHOULDER ARTHROSCOPY     rt bith defect , spur, rotator cuff   SPINAL CORD STIMULATOR IMPLANT     rt hip   SPINAL CORD STIMULATOR INSERTION N/A 11/03/2016   Procedure: LUMBAR SPINAL CORD STIMULATOR REVISION;  Surgeon: Budge Reyes, MD;  Location: [facility];  Service: Neurosurgery;  Laterality: N/A;   THYROID  LOBECTOMY     rt   Patient Active Problem List   Diagnosis Date Noted   Right flank pain 02/22/2024   RLQ abdominal pain 02/22/2024   Hematuria 01/08/2023   History of pulmonary embolus (PE) 01/08/2023   Pancytopenia (HCC) 01/08/2023   Chronic kidney disease, stage 3a (HCC) 01/08/2023   Hyperlipidemia 01/08/2023   AAA (abdominal aortic aneurysm) 01/08/2023   Delirium    Acute metabolic encephalopathy 02/24/2022   Acute encephalopathy 02/23/2022   Sepsis due to undetermined organism (HCC) 02/23/2022   Volume depletion 02/12/2022   Acute respiratory failure with hypoxia (HCC) 02/12/2022   Paroxysmal atrial fibrillation (HCC) 02/12/2022   Multifocal pneumonia    GI bleeding 02/05/2022   IBS (irritable bowel syndrome) 11/18/2021   Acute pulmonary embolism (HCC) 05/05/2021   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   Fatty liver 08/14/2020   Acute ITP (HCC) 05/09/2020   Constipation 05/24/2019   Pancreatic  calcification 05/24/2019   Gastroparesis 01/04/2019   N&V (nausea and vomiting) 11/17/2018   Debility 11/17/2018   Special screening for malignant neoplasms, colon 01/26/2018   Barrett's esophagus without dysplasia 01/26/2018   Thrombocytopenia 04/02/2017   Lumbar stenosis with neurogenic claudication 11/03/2016   Intractable vomiting with nausea 05/29/2016   OSA (obstructive sleep apnea) 11/18/2013   Liver lesion 03/01/2013   Barrett's esophagus 03/01/2013   GERD (gastroesophageal reflux disease) 03/01/2013   Essential hypertension, benign 03/01/2013   S/P lumbar fusion 03/25/2012   Back pain without radiation 03/25/2012    PCP: Bertell Satterfield, MD  REFERRING PROVIDER: Deruyter, Recardo Browning, FNP  REFERRING DIAG:  cancer related pain  THERAPY DIAG:  Physical deconditioning  Muscle weakness (generalized)  Impaired functional mobility, balance, gait, and endurance  Rationale for Evaluation and Treatment: Rehabilitation  ONSET DATE: First diagnosed 2007  SUBJECTIVE:   SUBJECTIVE STATEMENT: Intermittent vertigo has lasted over a week with instability during gait, no reports of recent fall does run into walls.  Returns to MD on 03/2024.  Current pain scale 7-8/10 in back.  EVAL: Patient arrives to PT for evaluation. Wife present throughout and contributes to history taking. Pt reports he has lymphoma and prostate cancer. Reports cancer has travelled to 5 bones at this point. Recent PSA score was 18. Did biopsies, revealed stage 4 cancer. Reports he just feels really weak, reports LE are really bad, got injection in L shoulder but hasn't helped as of yet. Reports he can't walk much, 1-2 minutes at most, some days are worse than others. Reports he also has back pain.   Pt actively taking chemo pills for cancer treatment. Pt allergic to demerol medication.   PERTINENT HISTORY: History of Present Illness  Traxton Kolenda is a 73 y.o. 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 He is accompanied by his wife and his daughter.  He has been doing okay since his last visit. He has noticed ongoing fatigue, grade 1-2. He is not walking as much as before, though he was not very active at baseline and has had a fall after his legs gave out on him. He has ongoing joint and bone pain (L shoulder, R hip, bilateral knees/shins), which was been going on for some time. These sites of pain do not completely correlate to known disease. He has good appetite, eating and drinking well. Denies nausea, vomiting, diarrhea, or constipation. He has ongoing hot flashes, but finds these manageable with a fan. He denies changes to urination. He still has discoloration to his forehead, previously seen by derm.    Spinal cord Stimulator, inserted 2018 R knee arthroscopy  Previous back surgeries Neck surgery 2012  PAIN:  Are you having pain? Yes: NPRS scale: 8/10 Pain location: back, legs, shoulders  Pain description: Achy, sharp Aggravating factors: Pushing up with LUE, walking  Relieving factors: laying flat, or against back of couch in semi reclined position  PRECAUTIONS: Other: Active CA mets to bone; Avoid excessive repetitive motion, heavy end range movement, high impact activity, caution with ther ex machines, no heat modalities   RED FLAGS: N/T in toes since back surgeries, reports recent constipation, MD aware   WEIGHT BEARING RESTRICTIONS: No  FALLS:  Has patient fallen in last 6 months? Yes. Number of falls 4. Most recent one where pt thinks he tripped, landed on face.  LIVING ENVIRONMENT: Lives with: lives with their spouse Lives in: House/apartment Stairs: Yes: External: 3-4 steps; can reach both Has following equipment at home: Single point cane, Walker - 2 wheeled, Wheelchair (manual), shower chair, bed side commode, Grab bars, and Scooter. Uses all as  needed  OCCUPATION: N/A  PLOF: Needs assistance with ADLs, assist needed for drying after bathing, assist with iADLs.   PATIENT GOALS: : to work on leg strength, balance, decrease risk of falls, strengthen arms, and endurance  NEXT MD VISIT: every 3 months, may be around Nov/Dec 2025 fro next follow up  OBJECTIVE:  Note: Objective measures were completed at Evaluation unless otherwise noted.  DIAGNOSTIC FINDINGS:  N/A  PATIENT SURVEYS:  LEFS  Extreme difficulty/unable (0), Quite a bit  of difficulty (1), Moderate difficulty (2), Little difficulty (3), No difficulty (4) Survey date:    Any of your usual work, housework or school activities   2. Usual hobbies, recreational or sporting activities   3. Getting into/out of the bath   4. Walking between rooms   5. Putting on socks/shoes   6. Squatting    7. Lifting an object, like a bag of groceries from the floor   8. Performing light activities around your home   9. Performing heavy activities around your home   10. Getting into/out of a car   11. Walking 2 blocks   12. Walking 1 mile   13. Going up/down 10 stairs (1 flight)   14. Standing for 1 hour   15.  sitting for 1 hour   16. Running on even ground   17. Running on uneven ground   18. Making sharp turns while running fast   19. Hopping    20. Rolling over in bed   Score total:  Lower Extremity Functional Score: 27 / 80 = 33.8 %      COGNITION: Overall cognitive status: Within functional limits for tasks assessed     SENSATION: WFL   POSTURE: rounded shoulders, forward head, and increased thoracic kyphosis   LOWER EXTREMITY ROM:  Active ROM Right eval Left eval  Hip flexion    Hip extension    Hip abduction    Hip adduction    Hip internal rotation    Hip external rotation    Knee flexion    Knee extension    Ankle dorsiflexion    Ankle plantarflexion    Ankle inversion    Ankle eversion     (Blank rows = not tested)  LOWER EXTREMITY  MMT:  MMT Right eval Left eval  Hip flexion 3+ 3+  Hip extension    Hip abduction 3+ 3+  Hip adduction 3+ 3+  Hip internal rotation    Hip external rotation    Knee flexion    Knee extension    Ankle dorsiflexion 4 4  Ankle plantarflexion    Ankle inversion    Ankle eversion     (Blank rows = not tested)  LOWER EXTREMITY SPECIAL TESTS:    FUNCTIONAL TESTS:  30 seconds chair stand test Timed up and go (TUG): 14 2 minute walk test: 308 ft, 3 standing breaks between, reports back pain 8-9/10.   30 seconds: 3.5 STS, push off with LUE, very slow and labored movement   Tandem Balance: RLE leading-3, LLE leading-4  SLS: 2 bilaterally  GAIT: Distance walked: 75-100 ft in session  Assistive device utilized: None Level of assistance: Complete Independence Comments: Shuffling feet, slight in-toeing of LLE, dec hip ext/flex bilat causing dec step length bilat, slight bend in knee throughout entire gait cycle, Forward lean, inc thoracic kyphosis, dec arm swing  TREATMENT DATE:  03/08/24:   Vitals: Lt UE in seated position:  BP: 106/76 mmHg, HR 83 2 minute wait complete  Seated heel/toe raises 10x LAQ Standing BP Lt UE: 84/63 mmHg, 97 bpm [place] UE/LE x BP: 99/73 mmHG, HR at 90 bpm Sidelying:  -Clam 10x  Supine:  -Bridge partial range glut squeeze 10x 5 BP seated Lt UE: 107/82 mmHg, HR 88 bpm Walked pt out to car for safety, encouraged to use RW for safety with gait and balance.  03/01/24:  Vitals 108/77 mmHg, HR 69 - STS no HHA 12x eccentric control - Toe tapping 8in step height alternating minimal HHA - Lateral toe tapping between 6 and 8in step 10x - Tandem stance on floor 1x 30 - Tandem stance on foam 2x 30 with intermittent HHA  Supine:  Instructed log rolling (assistance required with bottom leg) - isometric  adduction with ball  - isometric abduction into belt - SKTC 2x 30 with towel  02/26/24: Vitals: BP: 98/82    HR: 102   SpO2: 94% LAQ, seated edge of mat, 15x emphasis on ecc control + 2 lb ankle weight,  15x Seated marching, 2 lb. AW Side Stepping at edge of mat table, 2 lb. AW, 10x, one rest break between   02/24/24: Review of goals 2 minute walk test, 3 standing rest breaks, reports inc back pain HR: 121 bpm HR after 5 min rest:104 bpm HR following 5 min rest Review of HEP: Standing hip ext, 10x each LE Standing hip abduction, 10x each LE Mini squats with counter support, 5x2 Cold pack donned around neck and seated rest at end of session, terminated early due to fatigue and HR (113 by end of seated rest)  02/16/24: PT Eval and HEP    PATIENT EDUCATION:  Education details: PT evaluation, objective findings, POC, Importance of HEP, Precautions, Clinic policies Person educated: Patient Education method: Explanation and Demonstration Education comprehension: verbalized understanding and returned demonstration  HOME EXERCISE PROGRAM: Access Code: [email or web address] URL: [email or web address] Date: 02/16/2024 Prepared by: Rosaria Powell-Butler  Exercises - Standing Hip Abduction with Counter Support  - 2 x daily - 7 x weekly - 2 sets - 10 reps - Standing Hip Extension with Counter Support  - 2 x daily - 7 x weekly - 2 sets - 10 reps - Seated Long Arc Quad  - 2 x daily - 7 x weekly - 2 sets - 10 reps - Mini Squat with Counter Support  - 2 x daily - 7 x weekly - 2 sets - 10 reps  Access Code: [email or web address] URL: [email or web address] Date: 02/26/2024 Prepared by: Rosaria Powell-Butler  Exercises - Supine Bridge  - 2 x daily - 7 x weekly - 2 sets - 10 reps - Supine Hip Adduction Isometric with Ball  - 2 x daily - 7 x weekly - 2 sets - 10 reps - 5 hold - Hooklying Clamshell with Resistance  - 2 x daily - 7 x weekly - 2 sets - 10 reps - 5 hold - Supine Transversus  Abdominis Bracing - Hands on Stomach  - 2 x daily - 7 x weekly - 2 sets - 10 reps - 5 hold  Access Code: [email or web address] URL: [email or web address] Date: 03/01/2024 Prepared by: Augustin Mclean  Exercises - Standing Hip Abduction with Counter Support  - 2 x daily - 7 x weekly - 2 sets - 10 reps - Standing Hip Extension with Counter Support  - 2 x daily - 7 x weekly -  2 sets - 10 reps - Mini Squat with Counter Support  - 2 x daily - 7 x weekly - 2 sets - 10 reps - Seated Long Arc Quad  - 2 x daily - 7 x weekly - 2 sets - 10 reps - Supine Bridge  - 1 x daily - 7 x weekly - 3 sets - 10 reps - Supine Transversus Abdominis Bracing - Hands on Stomach  - 1 x daily - 7 x weekly - 3 sets - 10 reps - Supine Hip Adduction Isometric with Ball  - 1 x daily - 7 x weekly - 3 sets - 10 reps - Hooklying Single Knee to Chest Stretch  - 2 x daily - 7 x weekly - 1 sets - 3 reps - 30 hold - Hooklying Isometric Clamshell  - 1 x daily - 7 x weekly - 3 sets - 10 reps  ASSESSMENT:  CLINICAL IMPRESSION: Began session checking vitals with noted decreased in BP from sitting at 107/76 mmHg to standing 84/63 mmHG.  Pt educated on s/s of orthostatic hypotension and this decrease range in vitals play a part in his dizziness, encouraged pt to contact MD concerning current medication.  Vitals assessed through session (see above).  Exercises complete for LE strengthening, activity tolerance and balance.  Pt required moderate assistance with balance activities and some cueing to improve independence with bed mobility (log rolling techniques).  EOS therapist walked pt out to car for safety, pt encouraged to return to AD preferable RW for safety.  Began session checking vitals, within appropriate range for session.  Pt limited by fatigue and c/o lower back pain that was monitored through session.  Standing exercises with balance and LE strengthening exercises.  Intermittent HHA required during balance activities to assist  with SLS based activities.  Pt c/o increased pain back during standing and requested mat activities. Pt educated on proper bed mobility to reduce stress on lower back with some difficulty lifting bottom leg onto mat.  Isometric hip strengthening exercises complete for strengthening.  Added SKTC to assist with back mobility.  Reviewed current HEP, noted pt with multiple access codes.  Combined all exercises under one access code and added SKTC to assist with LBP and lumbar mobility.         EVAL: Patient is a 73 y.o. male who was seen today for physical therapy evaluation and treatment for  cancer related pain  . On this date, patient demonstrates decreased self perception of function, decreased LE strength, decreased endurance, impaired posture, decreased impaired balance, and general all over pain, all of which may be contributing to patients altered gait pattern, difficulty with functional transfers, decreased activity tolerance, and increased fall risk. Patient will benefit from skilled physical therapy in order to address the above/below deficits in order to improve gait, balance, strength, endurance, transfers, and pain to improve quality of life and function.    OBJECTIVE IMPAIRMENTS: Abnormal gait, decreased activity tolerance, decreased balance, decreased coordination, decreased endurance, decreased mobility, difficulty walking, decreased ROM, decreased strength, impaired flexibility, improper body mechanics, postural dysfunction, and pain.   ACTIVITY LIMITATIONS: carrying, lifting, bending, sitting, standing, squatting, stairs, transfers, bed mobility, bathing, dressing, and reach over head  PARTICIPATION LIMITATIONS: meal prep, cleaning, laundry, community activity, and yard work  PERSONAL FACTORS: Time since onset of injury/illness/exacerbation and 1-2 comorbidities: See above are also affecting patient's functional outcome.   REHAB POTENTIAL: Fair    CLINICAL DECISION MAKING:  Stable/uncomplicated  EVALUATION COMPLEXITY: Low  GOALS: Goals reviewed with patient? Yes  SHORT TERM GOALS: Target date: 03/08/24 Patient will be independent with performance of HEP to demonstrate adequate self management of symptoms.  Baseline:  Goal status: INITIAL  2.   Patient will report at least a 25% improvement with function and/or pain reduction overall since beginning PT. Baseline:  Goal status: INITIAL;   LONG TERM GOALS: Target date: 04/12/24 Patient will improve LEFS score by 9 points to demonstrate improved perceived function while meeting MCID.  Baseline: Goal status: INITIAL 2.  Patient will improve TUG score to 12 seconds or less in order to demonstrate decreased risk of falls.   Baseline:  Goal status: INITIAL 3.  Patient will improve  BLE MMT testing by at least 1/2 a grade to demonstrate increased LE strength and/or power needed to improve functional tranfers. Baseline:  Goal status: INITIAL   4.  Patient will increase 2 minute walk test gait distance to 20 ft with least restrictive assistive device to demonstrate improved endurance and functional mobility needed for ambulating household and community distances.  Baseline: Goal status: INITIAL  5. Patient will report pain rating of 6/10 or less following performance of ADLs in order to demonstrate progress towards independence with everyday activity. Baseline: Goal status: INITIAL    PLAN:  PT FREQUENCY: 2x/week  PT DURATION: 8 weeks  PLANNED INTERVENTIONS: 97164- PT Re-evaluation, 97110-Therapeutic exercises, 97530- Therapeutic activity, 97112- Neuromuscular re-education, 97535- Self Care, [id number]- Manual therapy, [phone]- Gait training, [phone]- Traction (mechanical), Patient/Family education, Balance training, Stair training, Taping, Joint mobilization, Spinal mobilization, and Cryotherapy  PLAN FOR NEXT SESSION: take HR and/or BP prior to start of session, general mobility, general strengthening,  endurance, address balance, pt wife mentioned possibly being trained on body mechanics when assisting patient on his bad days.  Check on F/U with medication or contacted MD concerning dizziness.  Augustin Mclean, LPTA/CLT; CBIS [phone]  4:15 PM, 03/08/24

## 2024-03-10 ENCOUNTER — Ambulatory Visit (HOSPITAL_COMMUNITY)

## 2024-03-10 ENCOUNTER — Encounter (HOSPITAL_COMMUNITY): Payer: Self-pay

## 2024-03-10 DIAGNOSIS — R5381 Other malaise: Secondary | ICD-10-CM | POA: Diagnosis not present

## 2024-03-10 DIAGNOSIS — Z7409 Other reduced mobility: Secondary | ICD-10-CM

## 2024-03-10 DIAGNOSIS — M6281 Muscle weakness (generalized): Secondary | ICD-10-CM

## 2024-03-10 NOTE — Therapy (Signed)
 OUTPATIENT PHYSICAL THERAPY LOWER EXTREMITY TREATMENT   Patient Name: [name] MRN: [id number] DOB:Jul 14, 1950, 73 y.o., male Today's Date: 03/10/2024  END OF SESSION:  PT End of Session - 03/10/24 1246     Visit Number 6    Number of Visits 16    Date for Recertification  03/15/24    Authorization Type Medicare Part A and B    Authorization Time Period no auth    Progress Note Due on Visit 10    PT Start Time 1246    PT Stop Time 1325    PT Time Calculation (min) 39 min    Activity Tolerance Patient tolerated treatment well;Patient limited by fatigue    Behavior During Therapy [facility] for tasks assessed/performed          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   Coronary artery disease    mild by 09/08/13 cath [facility]   GERD (gastroesophageal reflux disease)    barretts esophagus   H/O hiatal hernia    Headache(784.0)    Hypercholesteremia    Hypertension    PONV (postoperative nausea and vomiting)    trouble breathing after last surgery (at [facility])   Shingles    10   Sleep apnea    not tested   Tumor liver    no problems   Past Surgical History:  Procedure Laterality Date   ABDOMINAL SURGERY     x3   back surgeries     x 9   BACK SURGERY     x8   BIOPSY N/A 03/28/2013   Procedure: Distal Esophageal Biopsy;  Surgeon: Claudis RAYMOND Rivet, MD;  Location: [facility];  Service: Endoscopy;  Laterality: N/A;   BIOPSY  05/30/2016   Procedure: BIOPSY;  Surgeon: Claudis RAYMOND Rivet, MD;  Location: [facility];  Service: Endoscopy;;   BIOPSY  02/19/2018   Procedure: BIOPSY;  Surgeon: Rivet Claudis RAYMOND, MD;  [facility]: [facility];  Service: Endoscopy;;  Barret's esophagus   BIOPSY  08/29/2020   Procedure: BIOPSY;  Surgeon: Rivet Claudis RAYMOND, MD;  [facility]: [facility];  Service: Endoscopy;;   BIOPSY  12/31/2021   Procedure: BIOPSY;  Surgeon: Eartha Angelia Sieving, MD;  [facility]: [facility];  Service: Gastroenterology;;   CARDIAC CATHETERIZATION  2020   CHEST SURGERY     lung incisional hernia for hernia   COLONOSCOPY WITH PROPOFOL  N/A 02/19/2018   Procedure: COLONOSCOPY WITH PROPOFOL ;  Surgeon: Rivet Claudis RAYMOND, MD;  [facility];  Service: Endoscopy;  Laterality: N/A;  1:00   CORONARY ANGIOPLASTY WITH STENT PLACEMENT  2020   2 blockages: 1 stent   ESOPHAGOGASTRODUODENOSCOPY (EGD) WITH PROPOFOL  N/A 03/28/2013   Procedure: ESOPHAGOGASTRODUODENOSCOPY (EGD) WITH PROPOFOL ;  Surgeon: Claudis RAYMOND Rivet, MD;  Location: [facility];  Service: Endoscopy;  Laterality: N/A;  GE junction @ 38, proximal  margin @37    ESOPHAGOGASTRODUODENOSCOPY (EGD) WITH PROPOFOL  N/A 05/30/2016   Procedure: ESOPHAGOGASTRODUODENOSCOPY (EGD) WITH PROPOFOL ;  Surgeon: Claudis RAYMOND Rivet, MD;  [facility];  Service: Endoscopy;  Laterality: N/A;  955   ESOPHAGOGASTRODUODENOSCOPY (EGD) WITH PROPOFOL  N/A 02/19/2018   Procedure: ESOPHAGOGASTRODUODENOSCOPY (EGD) WITH PROPOFOL ;  Surgeon:  Golda Claudis PENNER, MD;  [facility];  Service: Endoscopy;  Laterality: N/A;   ESOPHAGOGASTRODUODENOSCOPY (EGD) WITH PROPOFOL  N/A 08/29/2020   Procedure: ESOPHAGOGASTRODUODENOSCOPY (EGD) WITH PROPOFOL ;  Surgeon: Golda Claudis PENNER, MD;  [facility];  Service: Endoscopy;  Laterality: N/A;  AM   ESOPHAGOGASTRODUODENOSCOPY (EGD) WITH PROPOFOL  N/A 12/31/2021   Procedure: ESOPHAGOGASTRODUODENOSCOPY (EGD) WITH PROPOFOL ;  Surgeon: Eartha Angelia Sieving, MD;  [facility];  Service: Gastroenterology;  Laterality: N/A;  100 ASA 2   HEMORRHOID SURGERY     x2   HERNIA REPAIR     HIATAL HERNIA REPAIR     JOINT REPLACEMENT     lft knee   KNEE ARTHROPLASTY     lft   KNEE ARTHROSCOPY Right    LEG SURGERY     x4 crushed    LITHOTRIPSY     MELANOMA EXCISION      lft abdomen   NECK SURGERY  2012   POLYPECTOMY  02/19/2018   Procedure: POLYPECTOMY;  Surgeon: Golda Claudis PENNER, MD;  [facility]: [facility];  Service: Endoscopy;;  proximal transverse colon (CB x1), descending colon (CBx3)   POSTERIOR CERVICAL FUSION/FORAMINOTOMY  08/18/2011   Procedure: POSTERIOR CERVICAL FUSION/FORAMINOTOMY LEVEL 3;  Surgeon: Reyes JONETTA Budge, MD;  Location: [facility];  Service: Neurosurgery;  Laterality: N/A;  Cervical three-four, four-five, five-six posterior cervical  fusion with instrumentation; Right cervical three-four, four-five laminotomy   REVISION TOTAL HIP ARTHROPLASTY Left    SHOULDER ARTHROSCOPY     rt bith defect , spur, rotator cuff   SPINAL CORD STIMULATOR IMPLANT     rt hip   SPINAL CORD STIMULATOR INSERTION N/A 11/03/2016   Procedure: LUMBAR SPINAL CORD STIMULATOR REVISION;  Surgeon: Budge Reyes, MD;  Location: [facility];  Service: Neurosurgery;  Laterality: N/A;   THYROID  LOBECTOMY     rt   Patient Active Problem List   Diagnosis Date Noted   Right flank pain 02/22/2024   RLQ abdominal pain 02/22/2024   Hematuria 01/08/2023   History of pulmonary embolus (PE) 01/08/2023   Pancytopenia (HCC) 01/08/2023   Chronic kidney disease, stage 3a (HCC) 01/08/2023   Hyperlipidemia 01/08/2023   AAA (abdominal aortic aneurysm) 01/08/2023   Delirium    Acute metabolic encephalopathy 02/24/2022   Acute encephalopathy 02/23/2022   Sepsis due to undetermined organism (HCC) 02/23/2022   Volume depletion 02/12/2022   Acute respiratory failure with hypoxia (HCC) 02/12/2022   Paroxysmal atrial fibrillation (HCC) 02/12/2022   Multifocal pneumonia    GI bleeding 02/05/2022   IBS (irritable bowel syndrome) 11/18/2021   Acute pulmonary embolism (HCC) 05/05/2021   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   Fatty liver 08/14/2020   Acute ITP (HCC) 05/09/2020   Constipation 05/24/2019   Pancreatic calcification 05/24/2019   Gastroparesis 01/04/2019    N&V (nausea and vomiting) 11/17/2018   Debility 11/17/2018   Special screening for malignant neoplasms, colon 01/26/2018   Barrett's esophagus without dysplasia 01/26/2018   Thrombocytopenia 04/02/2017   Lumbar stenosis with neurogenic claudication 11/03/2016   Intractable vomiting with nausea 05/29/2016   OSA (obstructive sleep apnea) 11/18/2013   Liver lesion 03/01/2013   Barrett's esophagus 03/01/2013   GERD (gastroesophageal reflux disease) 03/01/2013   Essential hypertension, benign 03/01/2013   S/P lumbar fusion 03/25/2012   Back pain without radiation 03/25/2012    PCP: Bertell Satterfield, MD  REFERRING PROVIDER: Deruyter, Recardo Browning, FNP  REFERRING DIAG:  cancer related pain   THERAPY DIAG:  Physical deconditioning  Muscle weakness (generalized)  Impaired functional mobility, balance, gait, and endurance  Rationale for Evaluation and Treatment: Rehabilitation  ONSET DATE: First diagnosed 2007  SUBJECTIVE:   SUBJECTIVE STATEMENT: 03/10/24:  Reports he was exhausted following last session for the day afterward.  Reports minimal dizziness today.  Pain scale 5/10 in back.  EVAL: Patient arrives to PT for evaluation. Wife present throughout and contributes to history taking. Pt reports he has lymphoma and prostate cancer. Reports cancer has travelled to 5 bones at this point. Recent PSA score was 18. Did biopsies, revealed stage 4 cancer. Reports he just feels really weak, reports LE are really bad, got injection in L shoulder but hasn't helped as of yet. Reports he can't walk much, 1-2 minutes at most, some days are worse than others. Reports he also has back pain.   Pt actively taking chemo pills for cancer treatment. Pt allergic to demerol medication.   PERTINENT HISTORY: History of Present Illness  [name] is a 73 y.o. 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  He is accompanied by his wife and his daughter.  He has been doing okay since his last visit. He has noticed ongoing fatigue, grade 1-2. He is not walking as much as before, though he was not very active at baseline and has had a fall after his legs gave out on him. He has ongoing joint and bone pain (L shoulder, R hip, bilateral knees/shins), which was been going on for some time. These sites of pain do not completely correlate to known disease. He has good appetite, eating and drinking well. Denies nausea, vomiting, diarrhea, or constipation. He has ongoing hot flashes, but finds these manageable with a fan. He denies changes to urination. He still has discoloration to his forehead, previously seen by derm.    Spinal cord Stimulator, inserted 2018 R knee arthroscopy  Previous back surgeries Neck surgery 2012  PAIN:  Are you having pain? Yes: NPRS scale: 8/10 Pain location: back, legs, shoulders  Pain description: Achy, sharp Aggravating factors: Pushing up with LUE, walking  Relieving factors: laying flat, or against back of couch in semi reclined position  PRECAUTIONS: Other: Active CA mets to bone; Avoid excessive repetitive motion, heavy end range movement, high impact activity, caution with ther ex machines, no heat modalities   RED FLAGS: N/T in toes since back surgeries, reports recent constipation, MD aware   WEIGHT BEARING RESTRICTIONS: No  FALLS:  Has patient fallen in last 6 months? Yes. Number of falls 4. Most recent one where pt thinks he tripped, landed on face.  LIVING ENVIRONMENT: Lives with: lives with their spouse Lives in: House/apartment Stairs: Yes: External: 3-4 steps; can reach both Has following equipment at home: Single point cane, Walker - 2 wheeled, Wheelchair (manual), shower chair, bed side commode, Grab bars, and Scooter. Uses all as needed  OCCUPATION: N/A  PLOF: Needs assistance with ADLs, assist needed for drying after bathing, assist with iADLs.    PATIENT GOALS: : to work on leg strength, balance, decrease risk of falls, strengthen arms, and endurance  NEXT MD VISIT: every 3 months, may be around Nov/Dec 2025 fro next follow up  OBJECTIVE:  Note: Objective measures were completed at Evaluation unless otherwise noted.  DIAGNOSTIC FINDINGS:  N/A  PATIENT SURVEYS:  LEFS  Extreme difficulty/unable (0), Quite a bit of difficulty (1), Moderate difficulty (2), Little difficulty (3), No difficulty (4) Survey date:  Any of your usual work, housework or school activities   2. Usual hobbies, recreational or sporting activities   3. Getting into/out of the bath   4. Walking between rooms   5. Putting on socks/shoes   6. Squatting    7. Lifting an object, like a bag of groceries from the floor   8. Performing light activities around your home   9. Performing heavy activities around your home   10. Getting into/out of a car   11. Walking 2 blocks   12. Walking 1 mile   13. Going up/down 10 stairs (1 flight)   14. Standing for 1 hour   15.  sitting for 1 hour   16. Running on even ground   17. Running on uneven ground   18. Making sharp turns while running fast   19. Hopping    20. Rolling over in bed   Score total:  Lower Extremity Functional Score: 27 / 80 = 33.8 %      COGNITION: Overall cognitive status: Within functional limits for tasks assessed     SENSATION: WFL   POSTURE: rounded shoulders, forward head, and increased thoracic kyphosis   LOWER EXTREMITY ROM:  Active ROM Right eval Left eval  Hip flexion    Hip extension    Hip abduction    Hip adduction    Hip internal rotation    Hip external rotation    Knee flexion    Knee extension    Ankle dorsiflexion    Ankle plantarflexion    Ankle inversion    Ankle eversion     (Blank rows = not tested)  LOWER EXTREMITY MMT:  MMT Right eval Left eval  Hip flexion 3+ 3+  Hip extension    Hip abduction 3+ 3+  Hip adduction 3+ 3+  Hip  internal rotation    Hip external rotation    Knee flexion    Knee extension    Ankle dorsiflexion 4 4  Ankle plantarflexion    Ankle inversion    Ankle eversion     (Blank rows = not tested)  LOWER EXTREMITY SPECIAL TESTS:    FUNCTIONAL TESTS:  30 seconds chair stand test Timed up and go (TUG): 14 2 minute walk test: 308 ft, 3 standing breaks between, reports back pain 8-9/10.   30 seconds: 3.5 STS, push off with LUE, very slow and labored movement   Tandem Balance: RLE leading-3, LLE leading-4  SLS: 2 bilaterally  GAIT: Distance walked: 75-100 ft in session  Assistive device utilized: None Level of assistance: Complete Independence Comments: Shuffling feet, slight in-toeing of LLE, dec hip ext/flex bilat causing dec step length bilat, slight bend in knee throughout entire gait cycle, Forward lean, inc thoracic kyphosis, dec arm swing  TREATMENT DATE:  03/10/24: Vitals: Lt UE in seated position:  BP: 122/88 mmHg, HR 76 : able to ambulate 255ft in 1'30 required seated rest break, shuffled gait and forward flexed  Standing:  RTB shoulder extension 2x 10 3 SBA for safety  RTB Rows 2x 10 3  6in toe tapping alternating 20x no HHA  Toe tapping between 6in and 8in step height 20x alternating no HHA  Sidestep with BTB around thigh 4RT in // bars  Tandem stance 2x 30 N[place]ep [place] UE/LE x5' Discussed walking program for activity tolerance, encouraged to start at 1 min and gradually increase Fatigue level 6-7/10  03/08/24:   Vitals: Lt UE in seated position:  BP: 106/76 mmHg, HR 83 2 minute wait complete  Seated heel/toe raises 10x LAQ Standing BP Lt UE: 84/63 mmHg, 97 bpm [place] UE/LE x BP: 99/73 mmHG, HR at 90 bpm Sidelying:  -Clam 10x  Supine:  -Bridge partial range glut squeeze 10x 5 BP seated Lt UE:  107/82 mmHg, HR 88 bpm Walked pt out to car for safety, encouraged to use RW for safety with gait and balance.  03/01/24:  Vitals 108/77 mmHg, HR 69 - STS no HHA 12x eccentric control - Toe tapping 8in step height alternating minimal HHA - Lateral toe tapping between 6 and 8in step 10x - Tandem stance on floor 1x 30 - Tandem stance on foam 2x 30 with intermittent HHA  Supine:  Instructed log rolling (assistance required with bottom leg) - isometric adduction with ball  - isometric abduction into belt - SKTC 2x 30 with towel  02/26/24: Vitals: BP: 98/82    HR: 102   SpO2: 94% LAQ, seated edge of mat, 15x emphasis on ecc control + 2 lb ankle weight,  15x Seated marching, 2 lb. AW Side Stepping at edge of mat table, 2 lb. AW, 10x, one rest break between   02/24/24: Review of goals 2 minute walk test, 3 standing rest breaks, reports inc back pain HR: 121 bpm HR after 5 min rest:104 bpm HR following 5 min rest Review of HEP: Standing hip ext, 10x each LE Standing hip abduction, 10x each LE Mini squats with counter support, 5x2 Cold pack donned around neck and seated rest at end of session, terminated early due to fatigue and HR (113 by end of seated rest)  02/16/24: PT Eval and HEP    PATIENT EDUCATION:  Education details: PT evaluation, objective findings, POC, Importance of HEP, Precautions, Clinic policies Person educated: Patient Education method: Explanation and Demonstration Education comprehension: verbalized understanding and returned demonstration  HOME EXERCISE PROGRAM: Access Code: [email or web address] URL: [email or web address] Date: 02/16/2024 Prepared by: Rosaria Powell-Butler  Exercises - Standing Hip Abduction with Counter Support  - 2 x daily - 7 x weekly - 2 sets - 10 reps - Standing Hip Extension with Counter Support  - 2 x daily - 7 x weekly - 2 sets - 10 reps - Seated Long Arc Quad  - 2 x daily - 7 x weekly - 2 sets - 10 reps - Mini Squat with  Counter Support  - 2 x daily - 7 x weekly - 2 sets - 10 reps  Access Code: [email or web address] URL: [email or web address] Date: 02/26/2024 Prepared by: Rosaria Powell-Butler  Exercises - Supine Bridge  - 2 x daily - 7 x weekly - 2 sets - 10 reps - Supine Hip Adduction Isometric with Ball  - 2 x daily - 7 x weekly - 2 sets - 10  reps - 5 hold - Hooklying Clamshell with Resistance  - 2 x daily - 7 x weekly - 2 sets - 10 reps - 5 hold - Supine Transversus Abdominis Bracing - Hands on Stomach  - 2 x daily - 7 x weekly - 2 sets - 10 reps - 5 hold  Access Code: [email or web address] URL: [email or web address] Date: 03/01/2024 Prepared by: Augustin Mclean  Exercises - Standing Hip Abduction with Counter Support  - 2 x daily - 7 x weekly - 2 sets - 10 reps - Standing Hip Extension with Counter Support  - 2 x daily - 7 x weekly - 2 sets - 10 reps - Mini Squat with Counter Support  - 2 x daily - 7 x weekly - 2 sets - 10 reps - Seated Long Arc Quad  - 2 x daily - 7 x weekly - 2 sets - 10 reps - Supine Bridge  - 1 x daily - 7 x weekly - 3 sets - 10 reps - Supine Transversus Abdominis Bracing - Hands on Stomach  - 1 x daily - 7 x weekly - 3 sets - 10 reps - Supine Hip Adduction Isometric with Ball  - 1 x daily - 7 x weekly - 3 sets - 10 reps - Hooklying Single Knee to Chest Stretch  - 2 x daily - 7 x weekly - 1 sets - 3 reps - 30 hold - Hooklying Isometric Clamshell  - 1 x daily - 7 x weekly - 3 sets - 10 reps  03/10/24:  walking program  ASSESSMENT:  CLINICAL IMPRESSION: Began session with vitals, WNL and reports of decreased dizziness earlier today, none currently.  complete with festinating gait noted, cueing to improve awareness of posture, increased stride length and heel to toe mechanics to reduce shuffling, pt limited by fatigue and required seated rest break at 1'30 in.  Educated benefits of beginning walking program and encouraged to use with AD (RW or cane) for safety on days  with increased fatigue.  Added postural strengthening exercises and continued with LE strengthening and balance activities.  Pt presents with improve eccentric control with STS and increase ease with toe tapping activities.  Continues to be limited by fatigue, required intermittent seated rest breaks through session.  No reports of increased pain at EOS.  Began session checking vitals, within appropriate range for session.  Pt limited by fatigue and c/o lower back pain that was monitored through session.  Standing exercises with balance and LE strengthening exercises.  Intermittent HHA required during balance activities to assist with SLS based activities.  Pt c/o increased pain back during standing and requested mat activities. Pt educated on proper bed mobility to reduce stress on lower back with some difficulty lifting bottom leg onto mat.  Isometric hip strengthening exercises complete for strengthening.  Added SKTC to assist with back mobility.  Reviewed current HEP, noted pt with multiple access codes.  Combined all exercises under one access code and added SKTC to assist with LBP and lumbar mobility.         EVAL: Patient is a 73 y.o. male who was seen today for physical therapy evaluation and treatment for  cancer related pain  . On this date, patient demonstrates decreased self perception of function, decreased LE strength, decreased endurance, impaired posture, decreased impaired balance, and general all over pain, all of which may be contributing to patients altered gait pattern, difficulty with functional transfers, decreased activity tolerance, and increased fall risk. Patient will  benefit from skilled physical therapy in order to address the above/below deficits in order to improve gait, balance, strength, endurance, transfers, and pain to improve quality of life and function.    OBJECTIVE IMPAIRMENTS: Abnormal gait, decreased activity tolerance, decreased balance, decreased coordination,  decreased endurance, decreased mobility, difficulty walking, decreased ROM, decreased strength, impaired flexibility, improper body mechanics, postural dysfunction, and pain.   ACTIVITY LIMITATIONS: carrying, lifting, bending, sitting, standing, squatting, stairs, transfers, bed mobility, bathing, dressing, and reach over head  PARTICIPATION LIMITATIONS: meal prep, cleaning, laundry, community activity, and yard work  PERSONAL FACTORS: Time since onset of injury/illness/exacerbation and 1-2 comorbidities: See above are also affecting patient's functional outcome.   REHAB POTENTIAL: Fair    CLINICAL DECISION MAKING: Stable/uncomplicated  EVALUATION COMPLEXITY: Low   GOALS: Goals reviewed with patient? Yes  SHORT TERM GOALS: Target date: 03/08/24 Patient will be independent with performance of HEP to demonstrate adequate self management of symptoms.  Baseline:  Goal status: INITIAL  2.   Patient will report at least a 25% improvement with function and/or pain reduction overall since beginning PT. Baseline:  Goal status: INITIAL;   LONG TERM GOALS: Target date: 04/12/24 Patient will improve LEFS score by 9 points to demonstrate improved perceived function while meeting MCID.  Baseline: Goal status: INITIAL 2.  Patient will improve TUG score to 12 seconds or less in order to demonstrate decreased risk of falls.   Baseline:  Goal status: INITIAL 3.  Patient will improve  BLE MMT testing by at least 1/2 a grade to demonstrate increased LE strength and/or power needed to improve functional tranfers. Baseline:  Goal status: INITIAL   4.  Patient will increase 2 minute walk test gait distance to 20 ft with least restrictive assistive device to demonstrate improved endurance and functional mobility needed for ambulating household and community distances.  Baseline: Goal status: INITIAL  5. Patient will report pain rating of 6/10 or less following performance of ADLs in order to  demonstrate progress towards independence with everyday activity. Baseline: Goal status: INITIAL    PLAN:  PT FREQUENCY: 2x/week  PT DURATION: 8 weeks  PLANNED INTERVENTIONS: 97164- PT Re-evaluation, 97110-Therapeutic exercises, 97530- Therapeutic activity, 97112- Neuromuscular re-education, 97535- Self Care, [id number]- Manual therapy, [phone]- Gait training, [phone]- Traction (mechanical), Patient/Family education, Balance training, Stair training, Taping, Joint mobilization, Spinal mobilization, and Cryotherapy  PLAN FOR NEXT SESSION: take HR and/or BP prior to start of session, general mobility, general strengthening, endurance, address balance, pt wife mentioned possibly being trained on body mechanics when assisting patient on his bad days.  Check on F/U with medication or contacted MD concerning dizziness- returns on 03/21/24.  Augustin Mclean, LPTA/CLT; CBIS [phone]  5:13 PM, 03/10/24

## 2024-03-11 ENCOUNTER — Emergency Department (HOSPITAL_COMMUNITY): Admission: EM | Admit: 2024-03-11 | Discharge: 2024-03-11 | Disposition: A | Discharge status: Home / Self Care

## 2024-03-11 ENCOUNTER — Other Ambulatory Visit: Payer: Self-pay

## 2024-03-11 ENCOUNTER — Emergency Department (HOSPITAL_COMMUNITY)

## 2024-03-11 ENCOUNTER — Encounter (HOSPITAL_COMMUNITY): Payer: Self-pay | Admitting: Emergency Medicine

## 2024-03-11 DIAGNOSIS — Z7901 Long term (current) use of anticoagulants: Secondary | ICD-10-CM | POA: Diagnosis not present

## 2024-03-11 DIAGNOSIS — S0990XA Unspecified injury of head, initial encounter: Secondary | ICD-10-CM | POA: Insufficient documentation

## 2024-03-11 DIAGNOSIS — W19XXXA Unspecified fall, initial encounter: Secondary | ICD-10-CM | POA: Insufficient documentation

## 2024-03-11 DIAGNOSIS — M25561 Pain in right knee: Secondary | ICD-10-CM

## 2024-03-11 DIAGNOSIS — M25562 Pain in left knee: Secondary | ICD-10-CM | POA: Diagnosis not present

## 2024-03-11 NOTE — ED Triage Notes (Signed)
 Pt bib [facility] for a fall to the right knee helping his wife up from a fall herself. Pt denies hitting head.

## 2024-03-11 NOTE — ED Notes (Signed)
 Daughter at [facility]. Pt self transferred from stretcher to w/c with assistance. Alert, NAD, calm, interactive. Denies questions or needs.

## 2024-03-11 NOTE — Discharge Instructions (Addendum)
 For pain, you can take 1000 mg of Tylenol  or 1 g of Tylenol  every 6-8 hours.  Do not exceed more than 4000 mg or 4 g in a 24-hour period.    Have a small knee effusion related to the trauma but no evidence of fracture.  Please let your pain tolerance be your guide to how much weight you placed on your knee.

## 2024-03-11 NOTE — ED Provider Notes (Signed)
 [name] EMERGENCY DEPARTMENT AT [facility] Provider Note   CSN: [id number] Arrival date & time: 03/11/24  1133     Patient presents with: [name] is a 73 y.o. male.    Fall Pertinent negatives include no chest pain, no abdominal pain and no shortness of breath.    Patient presents because of mechanical fall.  [name] when responding to his wife who had also fallen.  Landed on his right knee.  Patient does take anticoagulation.  Did not hit his head did not lose consciousness.  Patient states that right knee is currently hurting.  Having hard time ambulating because of right knee pain.  Otherwise, denies any neck pain thoracic pain or lumbar pain.  No chest pain or shortness of breath.  No nausea or diarrhea.  Patient states he just tripped whenever he was running to respond to his wife.      Prior to Admission medications   Medication Sig Start Date End Date Taking? Authorizing Provider  Bioflavonoid Products (ESTER C PO) Take 60 mg by mouth in the morning and at bedtime.    [provider]  BRUKINSA 80 MG capsule Take 160 mg by mouth 2 (two) times daily. Patient taking differently: Take 80 mg by mouth 2 (two) times daily.    [provider]  Calcium  Carbonate-Vitamin D  600-10 MG-MCG TABS Take 1 tablet by mouth 2 (two) times daily.     [provider]  cyclobenzaprine  (FLEXERIL ) 10 MG tablet Take 10 mg by mouth in the morning and at bedtime.    [provider]  dexlansoprazole (DEXILANT) 60 MG capsule Take 60 mg by mouth daily.     [provider]  diazepam  (VALIUM ) 10 MG tablet Take 10 mg by mouth in the morning and at bedtime. Patient taking differently: Take 10 mg by mouth 3 (three) times daily.    [provider]  diltiazem  (CARDIZEM ) 30 MG tablet Take 30 mg by mouth 2 (two) times daily.    [provider]  fexofenadine (ALLEGRA) 180 MG tablet Take 180 mg by mouth every evening.     [provider]  fish oil-omega-3 fatty acids  1000 MG capsule Take 1 g by mouth 2 (two) times daily.    [provider]  Lactobacillus (ACIDOPHILUS) TABS Take by mouth. Patient taking differently: Take by mouth every morning. 02/04/23   [provider]  lisinopril  (ZESTRIL ) 10 MG tablet Take 10 mg by mouth daily.    [provider]  mirabegron ER (MYRBETRIQ) 25 MG TB24 tablet Take 25 mg by mouth daily. Patient taking differently: Take 50 mg by mouth daily.    [provider]  montelukast  (SINGULAIR ) 10 MG tablet Take 10 mg by mouth daily.     [provider]  Multiple Vitamin (MULITIVITAMIN WITH MINERALS) TABS Take 1 tablet by mouth daily.    [provider]  NUBEQA 300 MG tablet Take 600 mg by mouth 2 (two) times daily.    [provider]  ondansetron  (ZOFRAN ) 8 MG tablet Take 8 mg by mouth as needed for nausea or vomiting.    [provider]  oxyCODONE -acetaminophen  (PERCOCET) 10-325 MG tablet Take by mouth. Patient taking differently: Take by mouth as needed.    [provider]  Probiotic Product (PROBIOTIC PO) Take 1 tablet by mouth daily.    [provider]  ranolazine  (RANEXA ) 500 MG 12 hr tablet Take 500 mg by mouth 2 (two) times  daily. 04/23/20   [provider]  rivaroxaban  (XARELTO ) 20 MG TABS tablet Take 10 mg by mouth daily.    [provider]  rosuvastatin  (CRESTOR ) 10 MG tablet Take 5 mg by mouth daily.    [provider]  tamsulosin (FLOMAX) 0.4 MG CAPS capsule Take 0.4 mg by mouth daily after supper.    [provider]  metoCLOPramide  (REGLAN ) 10 MG tablet Take 1 tablet by mouth in the morning and at bedtime. 08/03/20 02/22/24  [provider]    Allergies: Demerol    Review of Systems  Constitutional:  Negative for chills and fever.  HENT:  Negative for ear pain and sore throat.   Eyes:  Negative for pain and visual disturbance.   Respiratory:  Negative for cough and shortness of breath.   Cardiovascular:  Negative for chest pain and palpitations.  Gastrointestinal:  Negative for abdominal pain and vomiting.  Genitourinary:  Negative for dysuria and hematuria.  Musculoskeletal:  Negative for arthralgias and back pain.  Skin:  Negative for color change and rash.  Neurological:  Negative for seizures and syncope.  All other systems reviewed and are negative.   Updated Vital Signs BP (!) 161/85   Pulse 79   Temp 98.2 F (36.8 C) (Oral)   Ht 5' 11 (1.803 m)   Wt 106 kg   SpO2 93%   BMI 32.59 kg/m   Physical Exam Vitals and nursing note reviewed.  Constitutional:      General: He is not in acute distress.    Appearance: He is well-developed.  HENT:     Head: Normocephalic and atraumatic.  Eyes:     Conjunctiva/sclera: Conjunctivae normal.  Cardiovascular:     Rate and Rhythm: Normal rate and regular rhythm.     Heart sounds: No murmur heard. Pulmonary:     Effort: Pulmonary effort is normal. No respiratory distress.     Breath sounds: Normal breath sounds.  Abdominal:     Palpations: Abdomen is soft.     Tenderness: There is no abdominal tenderness.  Musculoskeletal:        General: No swelling.     Cervical back: Neck supple.       Legs:  Skin:    General: Skin is warm and dry.     Capillary Refill: Capillary refill takes less than 2 seconds.  Neurological:     Mental Status: He is alert.  Psychiatric:        Mood and Affect: Mood normal.     (all labs ordered are listed, but only abnormal results are displayed) Labs Reviewed - No data to display  EKG: None  Radiology: DG Knee Complete 4 Views Right Result Date: 03/11/2024 CLINICAL DATA:  Status post fall. EXAM: RIGHT KNEE - COMPLETE 4+ VIEW COMPARISON:  None Available. FINDINGS: No evidence of an acute fracture or dislocation. A small chronic deformity is seen along the dorsal aspect of the right patella. Mild tricompartmental  degenerative changes are present. A small suprapatellar effusion is noted. IMPRESSION: 1. Mild tricompartmental degenerative changes. 2. Small suprapatellar effusion. Electronically Signed   By: Suzen Dials M.D.   On: 03/11/2024 13:31   CT Head Wo Contrast Result Date: 03/11/2024 EXAM: CT HEAD WITHOUT CONTRAST 03/11/2024 12:49:11 PM TECHNIQUE: CT of the head was performed without the administration of intravenous contrast. Automated exposure control, iterative reconstruction, and/or weight based adjustment of the mA/kV was utilized to reduce the radiation dose to as low as reasonably achievable. COMPARISON: CT  Head 12/11/2023. CLINICAL HISTORY: 73 year old male. Evaluation for bleed in setting of fall and anticoagulation. Patient denies hitting head. FINDINGS: BRAIN AND VENTRICLES: Normal for age non contrast CT appearance of the brain. Stable cerebral volume. Gray white differentiation stable and within normal limits for age. Calcified atherosclerosis at the skull base. No suspicious intracranial vascular hyperdensity. No acute hemorrhage. No evidence of acute infarct. No hydrocephalus. No extra-axial collection. No mass effect or midline shift. ORBITS: No acute orbital injury. SINUSES: No acute abnormality. SOFT TISSUES AND SKULL: No acute scalp soft tissue injury. No skull fracture. IMPRESSION: 1. No acute traumatic injury identified 2. Normal for age non-contrast CT appearance of the brain. Electronically signed by: Helayne Hurst MD 03/11/2024 12:55 PM EDT RP Workstation: [id number]     Procedures   Medications Ordered in the ED - No data to display                                  Medical Decision Making Amount and/or Complexity of Data Reviewed Radiology: ordered.     HPI:    Patient presents because of mechanical fall.  [name] when responding to his wife who had also fallen.  Landed on his right knee.  Patient does take anticoagulation.  Did not hit his head did not lose  consciousness.  Patient states that right knee is currently hurting.  Having hard time ambulating because of right knee pain.  Otherwise, denies any neck pain thoracic pain or lumbar pain.  No chest pain or shortness of breath.  No nausea or diarrhea.  Patient states he just tripped whenever he was running to respond to his wife.   MDM:   Patient no acute distress.  Hemodynamically stable.  ANO x 3 GCS 15.  Given fall and on Eliquis despite not hitting head, did obtain CT head.  want to rule out any kind of subdural epidural.  Negative for acute pathology.  No Cervical thoracic or lumbar pain to palpation.  No indication for imaging.  Only source of pain is knee.  Patella midline.  No obvious patella tendon disruption.  Maybe a small amount of swelling around the knee after the fall.  Did obtain x-ray.  No acute pathology was seen.  Recommended rest ice and Tylenol .     I have independently interpreted the XR         Final diagnoses:  Fall, initial encounter  Acute pain of right knee    ED Discharge Orders     None          Simon Lavonia SAILOR, MD 03/11/24 1410

## 2024-03-14 ENCOUNTER — Observation Stay (HOSPITAL_COMMUNITY)
Admission: EM | Admit: 2024-03-14 | Discharge: 2024-03-16 | Disposition: A | Discharge status: Home Health | Attending: Hospitalist | Admitting: Hospitalist

## 2024-03-14 ENCOUNTER — Other Ambulatory Visit: Payer: Self-pay

## 2024-03-14 ENCOUNTER — Encounter (HOSPITAL_COMMUNITY): Payer: Self-pay

## 2024-03-14 DIAGNOSIS — R310 Gross hematuria: Secondary | ICD-10-CM | POA: Diagnosis present

## 2024-03-14 DIAGNOSIS — D61818 Other pancytopenia: Secondary | ICD-10-CM | POA: Insufficient documentation

## 2024-03-14 DIAGNOSIS — I2510 Atherosclerotic heart disease of native coronary artery without angina pectoris: Secondary | ICD-10-CM | POA: Diagnosis not present

## 2024-03-14 DIAGNOSIS — N1831 Chronic kidney disease, stage 3a: Secondary | ICD-10-CM | POA: Diagnosis not present

## 2024-03-14 DIAGNOSIS — Z6832 Body mass index (BMI) 32.0-32.9, adult: Secondary | ICD-10-CM | POA: Insufficient documentation

## 2024-03-14 DIAGNOSIS — E66811 Obesity, class 1: Secondary | ICD-10-CM | POA: Insufficient documentation

## 2024-03-14 DIAGNOSIS — G4733 Obstructive sleep apnea (adult) (pediatric): Secondary | ICD-10-CM | POA: Diagnosis not present

## 2024-03-14 DIAGNOSIS — C8307 Small cell B-cell lymphoma, spleen: Secondary | ICD-10-CM | POA: Diagnosis present

## 2024-03-14 DIAGNOSIS — Z85028 Personal history of other malignant neoplasm of stomach: Secondary | ICD-10-CM | POA: Insufficient documentation

## 2024-03-14 DIAGNOSIS — Z981 Arthrodesis status: Secondary | ICD-10-CM

## 2024-03-14 DIAGNOSIS — Z8546 Personal history of malignant neoplasm of prostate: Secondary | ICD-10-CM | POA: Diagnosis not present

## 2024-03-14 DIAGNOSIS — M48062 Spinal stenosis, lumbar region with neurogenic claudication: Secondary | ICD-10-CM

## 2024-03-14 DIAGNOSIS — E870 Hyperosmolality and hypernatremia: Secondary | ICD-10-CM | POA: Diagnosis not present

## 2024-03-14 DIAGNOSIS — Z96652 Presence of left artificial knee joint: Secondary | ICD-10-CM | POA: Diagnosis not present

## 2024-03-14 DIAGNOSIS — Z8579 Personal history of other malignant neoplasms of lymphoid, hematopoietic and related tissues: Secondary | ICD-10-CM | POA: Diagnosis not present

## 2024-03-14 DIAGNOSIS — I129 Hypertensive chronic kidney disease with stage 1 through stage 4 chronic kidney disease, or unspecified chronic kidney disease: Secondary | ICD-10-CM | POA: Diagnosis not present

## 2024-03-14 DIAGNOSIS — Z87891 Personal history of nicotine dependence: Secondary | ICD-10-CM | POA: Insufficient documentation

## 2024-03-14 DIAGNOSIS — N3090 Cystitis, unspecified without hematuria: Principal | ICD-10-CM | POA: Insufficient documentation

## 2024-03-14 DIAGNOSIS — Z86711 Personal history of pulmonary embolism: Secondary | ICD-10-CM | POA: Diagnosis not present

## 2024-03-14 DIAGNOSIS — Z79899 Other long term (current) drug therapy: Secondary | ICD-10-CM | POA: Insufficient documentation

## 2024-03-14 DIAGNOSIS — C61 Malignant neoplasm of prostate: Secondary | ICD-10-CM | POA: Diagnosis present

## 2024-03-14 DIAGNOSIS — I10 Essential (primary) hypertension: Secondary | ICD-10-CM | POA: Diagnosis present

## 2024-03-14 DIAGNOSIS — R112 Nausea with vomiting, unspecified: Secondary | ICD-10-CM

## 2024-03-14 LAB — CBC WITH DIFFERENTIAL/PLATELET
Abs Immature Granulocytes: 0.01 K/uL (ref 0.00–0.07)
Basophils Absolute: 0 K/uL (ref 0.0–0.1)
Basophils Relative: 0 %
Eosinophils Absolute: 0 K/uL (ref 0.0–0.5)
Eosinophils Relative: 0 %
HCT: 32.9 % — ABNORMAL LOW (ref 39.0–52.0)
Hemoglobin: 11.1 g/dL — ABNORMAL LOW (ref 13.0–17.0)
Immature Granulocytes: 0 %
Lymphocytes Relative: 23 %
Lymphs Abs: 0.6 K/uL — ABNORMAL LOW (ref 0.7–4.0)
MCH: 32.3 pg (ref 26.0–34.0)
MCHC: 33.7 g/dL (ref 30.0–36.0)
MCV: 95.6 fL (ref 80.0–100.0)
Monocytes Absolute: 0.4 K/uL (ref 0.1–1.0)
Monocytes Relative: 14 %
Neutro Abs: 1.7 K/uL (ref 1.7–7.7)
Neutrophils Relative %: 63 %
Platelets: 47 K/uL — ABNORMAL LOW (ref 150–400)
RBC: 3.44 MIL/uL — ABNORMAL LOW (ref 4.22–5.81)
RDW: 13.3 % (ref 11.5–15.5)
WBC: 2.7 K/uL — ABNORMAL LOW (ref 4.0–10.5)
nRBC: 0 % (ref 0.0–0.2)

## 2024-03-14 LAB — URINALYSIS, ROUTINE W REFLEX MICROSCOPIC
Bacteria, UA: NONE SEEN
Bilirubin Urine: NEGATIVE
Glucose, UA: NEGATIVE mg/dL
Ketones, ur: NEGATIVE mg/dL
Leukocytes,Ua: NEGATIVE
Nitrite: NEGATIVE
Protein, ur: 100 mg/dL — AB
RBC / HPF: >50 RBC/hpf (ref 0–5)
Specific Gravity, Urine: 1.023 (ref 1.005–1.030)
pH: 6 (ref 5.0–8.0)

## 2024-03-14 LAB — COMPREHENSIVE METABOLIC PANEL WITH GFR
ALT: 15 U/L (ref 0–44)
AST: 23 U/L (ref 15–41)
Albumin: 4 g/dL (ref 3.5–5.0)
Alkaline Phosphatase: 43 U/L (ref 38–126)
Anion gap: 17 — ABNORMAL HIGH (ref 5–15)
BUN: 29 mg/dL — ABNORMAL HIGH (ref 8–23)
CO2: 25 mmol/L (ref 22–32)
Calcium: 8.7 mg/dL — ABNORMAL LOW (ref 8.9–10.3)
Chloride: 110 mmol/L (ref 98–111)
Creatinine, Ser: 1.26 mg/dL — ABNORMAL HIGH (ref 0.61–1.24)
GFR, Estimated: >60 mL/min (ref >60)
Glucose, Bld: 107 mg/dL — ABNORMAL HIGH (ref 70–99)
Potassium: 3.7 mmol/L (ref 3.5–5.1)
Sodium: 151 mmol/L — ABNORMAL HIGH (ref 135–145)
Total Bilirubin: 0.8 mg/dL (ref 0.0–1.2)
Total Protein: 5.8 g/dL — ABNORMAL LOW (ref 6.5–8.1)

## 2024-03-14 MED ORDER — SODIUM CHLORIDE 0.9 % IV SOLN
2.0000 g | Freq: Once | INTRAVENOUS | Status: AC
  Administered 2024-03-14: 2 g via INTRAVENOUS
  Filled 2024-03-14: qty 20

## 2024-03-14 MED ORDER — SODIUM CHLORIDE 0.9 % IV BOLUS
1000.0000 mL | Freq: Once | INTRAVENOUS | Status: AC
  Administered 2024-03-14: 1000 mL via INTRAVENOUS

## 2024-03-14 NOTE — ED Triage Notes (Signed)
 Pt BIB [facility] for hematuria. Pt says its been going on for 3 days. Pt denies pain. Per EMS, negative on orthostatics. Pt has a hx of cancer. Pt takes blood thinners.   BP 106/70 HR 90-110

## 2024-03-15 ENCOUNTER — Encounter (HOSPITAL_COMMUNITY): Admitting: Physical Therapy

## 2024-03-15 ENCOUNTER — Encounter (HOSPITAL_COMMUNITY): Payer: Self-pay | Admitting: Family Medicine

## 2024-03-15 DIAGNOSIS — C61 Malignant neoplasm of prostate: Secondary | ICD-10-CM | POA: Diagnosis present

## 2024-03-15 DIAGNOSIS — E870 Hyperosmolality and hypernatremia: Secondary | ICD-10-CM | POA: Diagnosis present

## 2024-03-15 DIAGNOSIS — Z86711 Personal history of pulmonary embolism: Secondary | ICD-10-CM | POA: Diagnosis not present

## 2024-03-15 DIAGNOSIS — E669 Obesity, unspecified: Secondary | ICD-10-CM

## 2024-03-15 DIAGNOSIS — D61818 Other pancytopenia: Secondary | ICD-10-CM | POA: Diagnosis not present

## 2024-03-15 DIAGNOSIS — C8307 Small cell B-cell lymphoma, spleen: Secondary | ICD-10-CM | POA: Diagnosis not present

## 2024-03-15 DIAGNOSIS — R310 Gross hematuria: Secondary | ICD-10-CM | POA: Diagnosis not present

## 2024-03-15 LAB — BASIC METABOLIC PANEL WITH GFR
Anion gap: 13 (ref 5–15)
Anion gap: 9 (ref 5–15)
BUN: 28 mg/dL — ABNORMAL HIGH (ref 8–23)
BUN: 22 mg/dL (ref 8–23)
CO2: 25 mmol/L (ref 22–32)
CO2: 25 mmol/L (ref 22–32)
Calcium: 8.1 mg/dL — ABNORMAL LOW (ref 8.9–10.3)
Calcium: 8.5 mg/dL — ABNORMAL LOW (ref 8.9–10.3)
Chloride: 111 mmol/L (ref 98–111)
Chloride: 110 mmol/L (ref 98–111)
Creatinine, Ser: 1.14 mg/dL (ref 0.61–1.24)
Creatinine, Ser: 1.14 mg/dL (ref 0.61–1.24)
GFR, Estimated: >60 mL/min (ref >60)
GFR, Estimated: >60 mL/min (ref >60)
Glucose, Bld: 107 mg/dL — ABNORMAL HIGH (ref 70–99)
Glucose, Bld: 94 mg/dL (ref 70–99)
Potassium: 3.5 mmol/L (ref 3.5–5.1)
Potassium: 4.2 mmol/L (ref 3.5–5.1)
Sodium: 150 mmol/L — ABNORMAL HIGH (ref 135–145)
Sodium: 144 mmol/L (ref 135–145)

## 2024-03-15 LAB — HEMOGLOBIN AND HEMATOCRIT, BLOOD
HCT: 31.3 % — ABNORMAL LOW (ref 39.0–52.0)
Hemoglobin: 10.1 g/dL — ABNORMAL LOW (ref 13.0–17.0)

## 2024-03-15 LAB — CBC
HCT: 29.7 % — ABNORMAL LOW (ref 39.0–52.0)
Hemoglobin: 9.8 g/dL — ABNORMAL LOW (ref 13.0–17.0)
MCH: 32.2 pg (ref 26.0–34.0)
MCHC: 33 g/dL (ref 30.0–36.0)
MCV: 97.7 fL (ref 80.0–100.0)
Platelets: 40 K/uL — ABNORMAL LOW (ref 150–400)
RBC: 3.04 MIL/uL — ABNORMAL LOW (ref 4.22–5.81)
RDW: 13.3 % (ref 11.5–15.5)
WBC: 2.7 K/uL — ABNORMAL LOW (ref 4.0–10.5)
nRBC: 0 % (ref 0.0–0.2)

## 2024-03-15 MED ORDER — SODIUM CHLORIDE 0.9 % IV SOLN
1.0000 g | INTRAVENOUS | Status: DC
  Administered 2024-03-15: 1 g via INTRAVENOUS
  Filled 2024-03-15: qty 10

## 2024-03-15 MED ORDER — PROCHLORPERAZINE EDISYLATE 10 MG/2ML IJ SOLN: 5.0000 mg | Freq: Four times a day (QID) | INTRAMUSCULAR | Status: DC | PRN

## 2024-03-15 MED ORDER — DIAZEPAM 5 MG PO TABS
10.0000 mg | ORAL_TABLET | Freq: Three times a day (TID) | ORAL | Status: DC | PRN
  Administered 2024-03-15: 10 mg via ORAL
  Filled 2024-03-15: qty 2

## 2024-03-15 MED ORDER — CHLORHEXIDINE GLUCONATE CLOTH 2 % EX PADS
6.0000 | MEDICATED_PAD | Freq: Every day | CUTANEOUS | Status: DC
  Administered 2024-03-15 – 2024-03-16 (×2): 6 via TOPICAL

## 2024-03-15 MED ORDER — SENNOSIDES-DOCUSATE SODIUM 8.6-50 MG PO TABS
1.0000 | ORAL_TABLET | Freq: Every evening | ORAL | Status: DC | PRN
  Administered 2024-03-15: 1 via ORAL
  Filled 2024-03-15: qty 1

## 2024-03-15 MED ORDER — ACETAMINOPHEN 325 MG PO TABS: 650.0000 mg | ORAL_TABLET | Freq: Four times a day (QID) | ORAL | Status: DC | PRN

## 2024-03-15 MED ORDER — SODIUM CHLORIDE 0.45 % IV SOLN: INTRAVENOUS | Status: DC

## 2024-03-15 MED ORDER — ORAL CARE MOUTH RINSE: 15.0000 mL | OROMUCOSAL | Status: DC | PRN

## 2024-03-15 MED ORDER — ACETAMINOPHEN 650 MG RE SUPP: 650.0000 mg | Freq: Four times a day (QID) | RECTAL | Status: DC | PRN

## 2024-03-15 NOTE — Progress Notes (Signed)
 03/15/2024 12:10 PM -----------------------------------------------------------[facility]--------------------------------------------------- D(Data) A(Action) R(response)     Data: Discharge Readiness Assessment EDD listed 03/16/2024    Action: Chart reviewed    Response: No immediate Barriers to discharge identified at this time pending clinical improvement.     Tyrion Glaude, RN [employer]

## 2024-03-15 NOTE — H&P (Signed)
 History and Physical    [name] FMW:[id number] DOB: 1950-09-15 DOA: 03/14/2024  PCP: Maree Isles, MD   Patient coming from: Home   Chief Complaint: Blood in urine   HPI: [name] is a 73 y.o. male with medical history significant for CAD, OSA, prostate cancer metastatic to bone, splenic marginal zone lymphoma, and history of PE on Xarelto  who presents with gross hematuria.  Patient noted blood in his urine 2 days ago which has been persistent.  He denies any new dysuria, abdominal pain, flank pain, fevers, or chills.  Patient reports that he feels well but his daughter is very concerned, stating that he had severe sepsis in the past and the first sign at that time was gross hematuria.  Patient continues to take Xarelto  which had been decreased from 20 mg to 10 mg due to history of bleeding.  He denies any recent chest pain or shortness of breath.  ED Course: Upon arrival to the ED, patient is found to be afebrile and saturating well on room air with normal HR and stable BP.  Labs are most notable for sodium 151, creatinine 1.26, WBC 2700, hemoglobin 11.1, and platelets 47,000.  There is no bacteria and >50 RBC/hpf on urine microscopy.  Foley catheter was placed in the ED, urine was sent for culture, and the patient was given a liter of NS and 2 g IV Rocephin .  Review of Systems:  All other systems reviewed and apart from HPI, are negative.  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   Coronary artery disease    mild by 09/08/13 cath [facility]   GERD (gastroesophageal reflux disease)    barretts esophagus   H/O hiatal hernia    Headache(784.0)    Hypercholesteremia    Hypertension    PONV (postoperative nausea and vomiting)    trouble breathing after last  surgery (at [facility])   Shingles    10   Sleep apnea    not tested   Tumor liver    no problems    Past Surgical History:  Procedure Laterality Date   ABDOMINAL SURGERY     x3   back surgeries     x 9   BACK SURGERY     x8   BIOPSY N/A 03/28/2013   Procedure: Distal Esophageal Biopsy;  Surgeon: Claudis RAYMOND Rivet, MD;  Location: [facility];  Service: Endoscopy;  Laterality: N/A;   BIOPSY  05/30/2016   Procedure: BIOPSY;  Surgeon: Claudis RAYMOND Rivet, MD;  [facility]: [facility];  Service: Endoscopy;;   BIOPSY  02/19/2018   Procedure: BIOPSY;  Surgeon: Rivet Claudis RAYMOND, MD;  Location: [facility];  Service: Endoscopy;;  Barret's esophagus   BIOPSY  08/29/2020   Procedure: BIOPSY;  Surgeon: Rivet Claudis RAYMOND, MD;  Location: [facility];  Service: Endoscopy;;   BIOPSY  12/31/2021   Procedure: BIOPSY;  Surgeon: Eartha Angelia Sieving, MD;  Location: [facility];  Service: Gastroenterology;;   CARDIAC CATHETERIZATION  2020   CHEST SURGERY     lung incisional hernia for hernia   COLONOSCOPY WITH PROPOFOL  N/A 02/19/2018   Procedure: COLONOSCOPY WITH PROPOFOL ;  Surgeon: Rivet,  Claudis PENNER, MD;  Location: [facility];  Service: Endoscopy;  Laterality: N/A;  1:00   CORONARY ANGIOPLASTY WITH STENT PLACEMENT  2020   2 blockages: 1 stent   ESOPHAGOGASTRODUODENOSCOPY (EGD) WITH PROPOFOL  N/A 03/28/2013   Procedure: ESOPHAGOGASTRODUODENOSCOPY (EGD) WITH PROPOFOL ;  Surgeon: Claudis PENNER Rivet, MD;  Location: [facility];  Service: Endoscopy;  Laterality: N/A;  GE junction @ 38, proximal  margin @37    ESOPHAGOGASTRODUODENOSCOPY (EGD) WITH PROPOFOL  N/A 05/30/2016   Procedure: ESOPHAGOGASTRODUODENOSCOPY (EGD) WITH PROPOFOL ;  Surgeon: Claudis PENNER Rivet, MD;  [facility]: [facility];  Service: Endoscopy;  Laterality: N/A;  955   ESOPHAGOGASTRODUODENOSCOPY (EGD) WITH PROPOFOL  N/A 02/19/2018   Procedure: ESOPHAGOGASTRODUODENOSCOPY (EGD) WITH PROPOFOL ;  Surgeon: Rivet Claudis PENNER, MD;  [facility];  Service:  Endoscopy;  Laterality: N/A;   ESOPHAGOGASTRODUODENOSCOPY (EGD) WITH PROPOFOL  N/A 08/29/2020   Procedure: ESOPHAGOGASTRODUODENOSCOPY (EGD) WITH PROPOFOL ;  Surgeon: Rivet Claudis PENNER, MD;  [facility];  Service: Endoscopy;  Laterality: N/A;  AM   ESOPHAGOGASTRODUODENOSCOPY (EGD) WITH PROPOFOL  N/A 12/31/2021   Procedure: ESOPHAGOGASTRODUODENOSCOPY (EGD) WITH PROPOFOL ;  Surgeon: Eartha Angelia Sieving, MD;  [facility];  Service: Gastroenterology;  Laterality: N/A;  100 ASA 2   HEMORRHOID SURGERY     x2   HERNIA REPAIR     HIATAL HERNIA REPAIR     JOINT REPLACEMENT     lft knee   KNEE ARTHROPLASTY     lft   KNEE ARTHROSCOPY Right    LEG SURGERY     x4 crushed    LITHOTRIPSY     MELANOMA EXCISION     lft abdomen   NECK SURGERY  2012   POLYPECTOMY  02/19/2018   Procedure: POLYPECTOMY;  Surgeon: Rivet Claudis PENNER, MD;  Location[facility];  Service: Endoscopy;;  proximal transverse colon (CB x1), descending colon (CBx3)   POSTERIOR CERVICAL FUSION/FORAMINOTOMY  08/18/2011   Procedure: POSTERIOR CERVICAL FUSION/FORAMINOTOMY LEVEL 3;  Surgeon: Reyes JONETTA Budge, MD;  Location: [facility];  Service: Neurosurgery;  Laterality: N/A;  Cervical three-four, four-five, five-six posterior cervical  fusion with instrumentation; Right cervical three-four, four-five laminotomy   REVISION TOTAL HIP ARTHROPLASTY Left    SHOULDER ARTHROSCOPY     rt bith defect , spur, rotator cuff   SPINAL CORD STIMULATOR IMPLANT     rt hip   SPINAL CORD STIMULATOR INSERTION N/A 11/03/2016   Procedure: LUMBAR SPINAL CORD STIMULATOR REVISION;  Surgeon: Budge Reyes, MD;  Location: [facility] OR;  Service: Neurosurgery;  Laterality: N/A;   THYROID  LOBECTOMY     rt    Social History:   reports that he quit smoking about 43 years ago. His smoking use included cigarettes. He started smoking about 53 years ago. He has a 40 pack-year smoking history. He has never used smokeless tobacco. He reports that he  does not currently use alcohol. He reports that he does not use drugs.  Allergies  Allergen Reactions   Demerol Other (See Comments)    Makes me crazy.    Family History  Problem Relation Age of Onset   Heart disease Father    Aneurysm Mother    Aneurysm Sister    Heart disease Paternal Grandmother    Heart disease Paternal Grandfather    Heart disease Maternal Grandfather    Heart disease Brother        heart transplant   Aneurysm Other        pt states several members have aortic aneurysm  Skin cancer Other        several members   Lymphoma Brother    Aneurysm Maternal Grandmother      Prior to Admission medications   Medication Sig Start Date End Date Taking? Authorizing Provider  Bioflavonoid Products (ESTER C PO) Take 60 mg by mouth in the morning and at bedtime.    [provider]  BRUKINSA 80 MG capsule Take 160 mg by mouth 2 (two) times daily. Patient taking differently: Take 80 mg by mouth 2 (two) times daily.    [provider]  Calcium  Carbonate-Vitamin D  600-10 MG-MCG TABS Take 1 tablet by mouth 2 (two) times daily.     [provider]  cyclobenzaprine  (FLEXERIL ) 10 MG tablet Take 10 mg by mouth in the morning and at bedtime.    [provider]  dexlansoprazole (DEXILANT) 60 MG capsule Take 60 mg by mouth daily.     [provider]  diazepam  (VALIUM ) 10 MG tablet Take 10 mg by mouth in the morning and at bedtime. Patient taking differently: Take 10 mg by mouth 3 (three) times daily.    [provider]  diltiazem  (CARDIZEM ) 30 MG tablet Take 30 mg by mouth 2 (two) times daily.    [provider]  fexofenadine (ALLEGRA) 180 MG tablet Take 180 mg by mouth every evening.    [provider]  fish oil-omega-3 fatty acids  1000 MG capsule Take 1 g by mouth 2 (two) times daily.    [provider]  Lactobacillus (ACIDOPHILUS) TABS Take by mouth. Patient taking differently: Take by mouth  every morning. 02/04/23   [provider]  lisinopril  (ZESTRIL ) 10 MG tablet Take 10 mg by mouth daily.    [provider]  mirabegron ER (MYRBETRIQ) 25 MG TB24 tablet Take 25 mg by mouth daily. Patient taking differently: Take 50 mg by mouth daily.    [provider]  montelukast  (SINGULAIR ) 10 MG tablet Take 10 mg by mouth daily.     [provider]  Multiple Vitamin (MULITIVITAMIN WITH MINERALS) TABS Take 1 tablet by mouth daily.    [provider]  NUBEQA 300 MG tablet Take 600 mg by mouth 2 (two) times daily.    [provider]  ondansetron  (ZOFRAN ) 8 MG tablet Take 8 mg by mouth as needed for nausea or vomiting.    [provider]  oxyCODONE -acetaminophen  (PERCOCET) 10-325 MG tablet Take by mouth. Patient taking differently: Take by mouth as needed.    [provider]  Probiotic Product (PROBIOTIC PO) Take 1 tablet by mouth daily.    [provider]  ranolazine  (RANEXA ) 500 MG 12 hr tablet Take 500 mg by mouth 2 (two) times daily. 04/23/20   [provider]  rivaroxaban  (XARELTO ) 20 MG TABS tablet Take 10 mg by mouth daily.    [provider]  rosuvastatin  (CRESTOR ) 10 MG tablet Take 5 mg by mouth daily.    [provider]  tamsulosin (FLOMAX) 0.4 MG CAPS capsule Take 0.4 mg by mouth daily after supper.    [provider]  metoCLOPramide  (REGLAN ) 10 MG tablet Take 1 tablet by mouth in the morning and at bedtime. 08/03/20 02/22/24  [provider]    Physical Exam: Vitals:   03/14/24 2045 03/14/24 2215 03/14/24 2230 03/14/24 2324  BP:  126/78 135/80 (!) 152/85  Pulse: 86 71 76 66  Resp:    18  Temp:    (!) 97.5 F (36.4 C)  TempSrc:    Oral  SpO2: 93% 94% 94% 94%  Weight:    106.7 kg    Constitutional: NAD, no pallor or diaphoresis   Eyes: PERTLA, lids and conjunctivae normal ENMT: Mucous membranes are moist. Posterior pharynx clear of any exudate or lesions.    Neck: supple, no masses  Respiratory: no wheezing, no crackles. No accessory muscle use.  Cardiovascular: S1 & S2 heard, regular rate and rhythm. No extremity edema.   Abdomen: No tenderness, soft. Bowel sounds active.  Musculoskeletal: no clubbing / cyanosis. No joint deformity upper and lower extremities.   Skin: no significant rashes, lesions, ulcers. Warm, dry, well-perfused. Neurologic: CN 2-12 grossly intact. Moving all extremities. Alert and oriented.  Psychiatric: Calm. Cooperative.    Labs and Imaging on Admission: I have personally reviewed following labs and imaging studies  CBC: Recent Labs  Lab 03/14/24 1923  WBC 2.7*  NEUTROABS 1.7  HGB 11.1*  HCT 32.9*  MCV 95.6  PLT 47*   Basic Metabolic Panel: Recent Labs  Lab 03/14/24 1923  NA 151*  K 3.7  CL 110  CO2 25  GLUCOSE 107*  BUN 29*  CREATININE 1.26*  CALCIUM  8.7*   GFR: Estimated Creatinine Clearance: 65.9 mL/min (A) (by C-G formula based on SCr of 1.26 mg/dL (H)). Liver Function Tests: Recent Labs  Lab 03/14/24 1923  AST 23  ALT 15  ALKPHOS 43  BILITOT 0.8  PROT 5.8*  ALBUMIN 4.0   No results for input(s): LIPASE, AMYLASE in the last 168 hours. No results for input(s): AMMONIA in the last 168 hours. Coagulation Profile: No results for input(s): INR, PROTIME in the last 168 hours. Cardiac Enzymes: No results for input(s): CKTOTAL, CKMB, CKMBINDEX, TROPONINI in the last 168 hours. BNP (last 3 results) No results for input(s): PROBNP in the last 8760 hours. HbA1C: No results for input(s): HGBA1C in the last 72 hours. CBG: No results for input(s): GLUCAP in the last 168 hours. Lipid Profile: No results for input(s): CHOL, HDL, LDLCALC, TRIG, CHOLHDL, LDLDIRECT in the last 72 hours. Thyroid  Function Tests: No results for input(s): TSH, T4TOTAL, FREET4, T3FREE, THYROIDAB in the last 72 hours. Anemia Panel: No results for input(s): VITAMINB12,  FOLATE, FERRITIN, TIBC, IRON, RETICCTPCT in the last 72 hours. Urine analysis:    Component Value Date/Time   COLORURINE AMBER (A) 03/14/2024 2106   APPEARANCEUR CLOUDY (A) 03/14/2024 2106   LABSPEC 1.023 03/14/2024 2106   PHURINE 6.0 03/14/2024 2106   GLUCOSEU NEGATIVE 03/14/2024 2106   HGBUR LARGE (A) 03/14/2024 2106   BILIRUBINUR NEGATIVE 03/14/2024 2106   KETONESUR NEGATIVE 03/14/2024 2106   PROTEINUR 100 (A) 03/14/2024 2106   UROBILINOGEN 0.2 07/16/2012 1829   NITRITE NEGATIVE 03/14/2024 2106   LEUKOCYTESUR NEGATIVE 03/14/2024 2106   Sepsis Labs: @LABRCNTIP (procalcitonin:4,lacticidven:4) )No results found for this or any previous visit (from the past 240 hours).   Radiological Exams on Admission: No results found.   Assessment/Plan   1. Gross hematuria  - Foley was placed in ED and is draining dark red urine - No infectious signs/symptoms  - Monitor for obstruction, hold Xarelto  for now, follow culture and clinical course   2. Hypernatremia  - Serum sodium is 151 in setting of hypovolemia  - He was given a liter of NS in ED  - Continue IVF hydration with 0.45% NaCl and repeat chem panel in am   3. Pancytopenia  - Appears stable  - Hold Xarelto  given platelets <50k and minor bleeding, repeat CBC in  am    4. Hx of PE  - Hold Xarelto  given platelet count 47k and hematuria    5. Marginal zone lymphoma of spleen  - Followed by oncology at [facility] and managed with Brukinsa    6. Prostate cancer metastatic to bone  - Followed by oncology at [facility] and managed with Nubeqa    7. CKD 3A  - Appears close to baseline  - Renally-dose medications    8. OSA  - CPAP while sleeping     DVT prophylaxis: SCDs  Code Status: Full   Level of Care: Level of care: Med-Surg Family Communication:  Disposition Plan:  Patient is from: Home  Anticipated d/c is to: Home  Anticipated d/c date is: 03/15/24  Patient currently: Pending clinical stability  Consults called:  None  Admission status: Observation     Evalene GORMAN Sprinkles, MD [facility]  03/15/2024, 12:32 AM

## 2024-03-15 NOTE — Progress Notes (Signed)
Pt declined CPAP for tonight. 

## 2024-03-15 NOTE — Progress Notes (Signed)
   03/15/24 1710  Urethral Catheter  Placement Date/Time: 03/14/24 2100   Inserted prior to unit arrival?: Yes  Output (mL) 1250 mL  Urine Measurement/Characteristics  Urine Color Red  Urine Appearance Bloody   Dr. Darci made aware of patients dark, bloody urine, orders for H and H blood draw placed.

## 2024-03-15 NOTE — Evaluation (Signed)
 Physical Therapy Evaluation Patient Details Name: [name] MRN: [id number] DOB: 12/01/1950 Today's Date: 03/15/2024  History of Present Illness  [name] is a 72 y.o. male with medical history significant for CAD, OSA, prostate cancer metastatic to bone, splenic marginal zone lymphoma, and history of PE on Xarelto  who presents with gross hematuria.     Patient noted blood in his urine 2 days ago which has been persistent.  He denies any new dysuria, abdominal pain, flank pain, fevers, or chills.  Patient reports that he feels well but his daughter is very concerned, stating that he had severe sepsis in the past and the first sign at that time was gross hematuria.  Patient continues to take Xarelto  which had been decreased from 20 mg to 10 mg due to history of bleeding.  He denies any recent chest pain or shortness of breath.   Clinical Impression  Patient agreeable to PT evaluation. Patient's daughter is present during evaluation and contributes to subjective history taking. Reports at baseline, patient receives assist for ADLs, and has recently been using RW for household ambulation since fall last week. Wife usually assists with ADLs but is now limited due to her recent fall which she sustained a broken L shoulder. On this date, patient requires moderate assist for bed mobility, min assist for transfer, and CGA during ambulation with RW due to general weakness and for safety. Patients daughter reports she will either take patient to her home or she and family plan to provide 24/7 assist/supervision for patient at his home at discharge. Reports they feel comfortable with providing amount of assistance needed as this is patient usual baseline. Patient tolerates sitting in recliner at end of session, daughter present, and call button within reach. Patient is currently receiving PT services in outpatient setting. Daughter reports being able to provide transportation to continue those services.  Patient will benefit from continued skilled physical therapy acutely and in recommended venue in order to address current deficits.            If plan is discharge home, recommend the following: A little help with walking and/or transfers;A little help with bathing/dressing/bathroom;Assistance with cooking/housework;Assist for transportation;Help with stairs or ramp for entrance   Can travel by private vehicle        Equipment Recommendations None recommended by PT  Recommendations for Other Services       Functional Status Assessment Patient has had a recent decline in their functional status and demonstrates the ability to make significant improvements in function in a reasonable and predictable amount of time.     Precautions / Restrictions Precautions Precautions: Fall Recall of Precautions/Restrictions: Intact Restrictions Weight Bearing Restrictions Per Provider Order: No      Mobility  Bed Mobility Overal bed mobility: Needs Assistance Bed Mobility: Supine to Sit     Supine to sit: Mod assist     General bed mobility comments: HOB flat, moderate assist for LE to EOB and pull to sit for trunk elevation, pt demo slow labored movement throughout    Transfers Overall transfer level: Needs assistance Equipment used: Rolling walker (2 wheels) Transfers: Sit to/from Stand, Bed to chair/wheelchair/BSC Sit to Stand: Min assist   Step pivot transfers: Contact guard assist       General transfer comment: Min assist for STS from bed due to LE weakness. Demo slow labored movement. CGA during transfer for safety but no overt LOB or unsteadiness    Ambulation/Gait Ambulation/Gait assistance: Contact guard assist  Gait Distance (Feet): 150 Feet Assistive device: Rolling walker (2 wheels) Gait Pattern/deviations: Step-through pattern, Decreased stride length, Trunk flexed, Narrow base of support Gait velocity: WFL     General Gait Details: Pt demo the above  deviations but with no overt LOB or unsteadiness. CGA for safety with RW. Pt demo improved gait mechanics with RW versus no AD  Stairs            Wheelchair Mobility     Tilt Bed    Modified Rankin (Stroke Patients Only)       Balance Overall balance assessment: Needs assistance Sitting-balance support: Feet supported, Bilateral upper extremity supported Sitting balance-Leahy Scale: Fair Sitting balance - Comments: Seated EOB   Standing balance support: During functional activity, Bilateral upper extremity supported Standing balance-Leahy Scale: Fair Standing balance comment: w/ RW           Pertinent Vitals/Pain Pain Assessment Pain Assessment: 0-10 Pain Score: 7  Pain Location: Back Pain Descriptors / Indicators: Discomfort Pain Intervention(s): Limited activity within patient's tolerance, Repositioned, Monitored during session    Home Living Family/patient expects to be discharged to:: Private residence Living Arrangements: Spouse/significant other Available Help at Discharge: Family;Available 24 hours/day Type of Home: House Home Access: Stairs to enter Entrance Stairs-Rails: Left Entrance Stairs-Number of Steps: 4 Alternate Level Stairs-Number of Steps: has 2 steps to get into bathroom Home Layout: Two level;Able to live on main level with bedroom/bathroom Home Equipment: Rolling Walker (2 wheels);Cane - single point;Shower seat;BSC/3in1 Additional Comments: Pt lives wiht wife who assists physically but wife recently broke shoulder. Plan is to go stay with daughter at her home (1 level home, 2 STE) or daughter and family provide 24/7 care for husband/wife. Daughter reports being able to physically assist father.    Prior Function Prior Level of Function : Needs assist;History of Falls (last six months)       Physical Assist : ADLs (physical);Mobility (physical)   ADLs (physical): Bathing;Dressing;Grooming Mobility Comments: Pt reports recent use of RW  after most recent fall when trying to help wife from her fall. Mostly household ambulation, previously limited community ambulator without AD ADLs Comments: Assisted by family     Extremity/Trunk Assessment   Upper Extremity Assessment Upper Extremity Assessment: Defer to OT evaluation    Lower Extremity Assessment Lower Extremity Assessment: Generalized weakness;Overall WFL for tasks assessed (Pt generally weak throughout. grossly 4-/5 for LE MMT)    Cervical / Trunk Assessment Cervical / Trunk Assessment: Kyphotic  Communication   Communication Communication: No apparent difficulties    Cognition Arousal: Alert Behavior During Therapy: WFL for tasks assessed/performed   PT - Cognitive impairments: No apparent impairments   Following commands: Intact       Cueing Cueing Techniques: Verbal cues, Tactile cues, Visual cues     General Comments      Exercises     Assessment/Plan    PT Assessment Patient needs continued PT services;All further PT needs can be met in the next venue of care  PT Problem List Decreased strength;Decreased activity tolerance;Decreased balance;Decreased mobility;Pain       PT Treatment Interventions DME instruction;Gait training;Stair training;Functional mobility training;Therapeutic activities;Therapeutic exercise;Balance training;Patient/family education    PT Goals (Current goals can be found in the Care Plan section)  Acute Rehab PT Goals Patient Stated Goal: Return home PT Goal Formulation: With patient/family Time For Goal Achievement: 03/18/24 Potential to Achieve Goals: Good    Frequency Min 3X/week     Co-evaluation  AM-PAC PT 6 Clicks Mobility  Outcome Measure Help needed turning from your back to your side while in a flat bed without using bedrails?: A Little Help needed moving from lying on your back to sitting on the side of a flat bed without using bedrails?: A Lot Help needed moving to and from  a bed to a chair (including a wheelchair)?: A Little Help needed standing up from a chair using your arms (e.g., wheelchair or bedside chair)?: A Little Help needed to walk in hospital room?: A Little Help needed climbing 3-5 steps with a railing? : A Lot 6 Click Score: 16    End of Session Equipment Utilized During Treatment: Gait belt Activity Tolerance: Patient tolerated treatment well Patient left: in chair;with call bell/phone within reach;with family/visitor present   PT Visit Diagnosis: History of falling (Z91.81);Muscle weakness (generalized) (M62.81);Pain;Difficulty in walking, not elsewhere classified (R26.2)    Time: 9041-8973 PT Time Calculation (min) (ACUTE ONLY): 28 min   Charges:   PT Evaluation $PT Eval Moderate Complexity: 1 Mod   PT General Charges $$ ACUTE PT VISIT: 1 Visit        12:24 PM, 03/15/24 Halim Surrette Powell-Butler, PT, DPT [place] with [facility]

## 2024-03-15 NOTE — Progress Notes (Signed)
 Patient has refused CPAP for tonight.  I explained that patient is free to bring his from home tomorrow if he has to be here for more than one night, but he is aware that we have machines here if he changes his mind.

## 2024-03-15 NOTE — Care Management Obs Status (Signed)
 MEDICARE OBSERVATION STATUS NOTIFICATION   Patient Details  Name: [name] MRN: [id number] Date of Birth: Oct 23, 1950   Medicare Observation Status Notification Given:  Yes    Ladeana Laplant L Diyan Dave 03/15/2024, 4:28 PM

## 2024-03-15 NOTE — Progress Notes (Signed)
 Progress Note   Patient: [name] FMW:[id number] DOB: Dec 01, 1950 DOA: 03/14/2024     0 DOS: the patient was seen and examined on 03/15/2024   Brief hospital course: [name] is a 73 y.o. male with medical history significant for CAD, OSA, prostate cancer metastatic to bone, splenic marginal zone lymphoma, and history of PE on Xarelto  who presents with gross hematuria.   Assessment and Plan:  Gross hematuria  Foley placed in ED and is draining dark red urine. Continue to hold Xarelto  for now. Monitor H/H q12HR.  He denies urinary symptoms, but per daughter he presented septic with hematuria last admission, will continue Rocephin , follow cultures. He will need to follow urology, [facility] oncology/ hematology upon discharge. Plan to discharge him once hematuria clears.   Hypernatremia  Serum sodium is 151 in setting of hypovolemia, repeat BMP ordered for this PM. Continue IVF hydration with 0.45% NaCl and adjust according to Na trend.   Pancytopenia  Appears stable  Hold Xarelto  given platelets <50k and hematuria, follow H/H 4pm. Repeat CBC in am     Hx of PE  Hold Xarelto  given platelet count 47k and hematuria. He is advised to hold it and talk to [facility] hematology regarding further directions. Daughter is aware to call primary hematology.   Marginal zone lymphoma of spleen  Followed by oncology at [facility] and managed with Brukinsa. Daughter called his primary oncology who advised to hold Brukinsa.   Prostate cancer metastatic to bone  Followed by oncology at [facility] and managed with Nubeqa (held for now)   CKD 3A  Appears close to baseline  Avoid nephrotoxic drugs.  Renally-dose medications     Obesity class I BM1 32.81 Diet, exercise and weight reduction advised. CPAP while sleeping for OSA.     Out of bed to chair. Incentive spirometry. Nursing supportive care. Fall, aspiration precautions. Diet:  Diet Orders (From admission, onward)     Start     Ordered    03/15/24 0031  Diet Heart Room service appropriate? Yes; Fluid consistency: Thin  Diet effective now       Question Answer Comment  Room service appropriate? Yes   Fluid consistency: Thin      03/15/24 0031           DVT prophylaxis: SCDs Start: 03/15/24 0031  Level of care: Med-Surg   Code Status: Full Code  Subjective: Patient is seen and examined today morning. He is weak, did not get out of bed. Foley dark red urine. Advised to work with PT. He wishes to go home, advised to stay another night due to persistent hematuria.  Physical Exam: Vitals:   03/14/24 2324 03/15/24 0326 03/15/24 0709 03/15/24 1352  BP: (!) 152/85 108/64 116/66 122/64  Pulse: 66 (!) 57 (!) 59 (!) 59  Resp: 18 18 18 20   Temp: (!) 97.5 F (36.4 C) 97.6 F (36.4 C) 98.1 F (36.7 C) 98.7 F (37.1 C)  TempSrc: Oral Oral Axillary Oral  SpO2: 94% 98% 96% 96%  Weight: 106.7 kg       General - Elderly Caucasian obese male, no apparent distress HEENT - PERRLA, EOMI, atraumatic head, non tender sinuses. Lung - Clear, basal rales, rhonchi, no wheezes. Heart - S1, S2 heard, no murmurs, rubs, trace pedal edema. Abdomen - Soft, non tender, obese, foley intact with dark urine. Neuro - Alert, awake and oriented x 3, non focal exam. Skin - Warm and dry.  Data Reviewed:  Latest Ref Rng & Units 03/15/2024    4:32 AM 03/14/2024    7:23 PM 09/21/2023    2:24 PM  CBC  WBC 4.0 - 10.5 K/uL 2.7  2.7  2.6   Hemoglobin 13.0 - 17.0 g/dL 9.8  88.8  86.9   Hematocrit 39.0 - 52.0 % 29.7  32.9  39.4   Platelets 150 - 400 K/uL 40  47  56       Latest Ref Rng & Units 03/15/2024    4:32 AM 03/14/2024    7:23 PM 03/04/2024   12:15 PM  BMP  Glucose 70 - 99 mg/dL 892  892    BUN 8 - 23 mg/dL 28  29    Creatinine 9.38 - 1.24 mg/dL 8.85  8.73  8.69   Sodium 135 - 145 mmol/L 150  151    Potassium 3.5 - 5.1 mmol/L 3.5  3.7    Chloride 98 - 111 mmol/L 111  110    CO2 22 - 32 mmol/L 25  25    Calcium  8.9 - 10.3  mg/dL 8.1  8.7     No results found.  Family Communication: Discussed with patient, daughter at bedside. They understand and agree. All questions answered.  Disposition: Status is: Observation The patient remains OBS appropriate and will d/c before 2 midnights.  Planned Discharge Destination: Home     Time spent: 45 minutes  Author: Concepcion Riser, MD 03/15/2024 2:10 PM Secure chat 7am to 7pm For on call review [email or web address].

## 2024-03-15 NOTE — Plan of Care (Signed)
  Problem: Acute Rehab PT Goals(only PT should resolve) Goal: Pt Will Go Supine/Side To Sit Outcome: Progressing Flowsheets (Taken 03/15/2024 1226) Pt will go Supine/Side to Sit: with minimal assist Goal: Patient Will Transfer Sit To/From Stand Outcome: Progressing Flowsheets (Taken 03/15/2024 1226) Patient will transfer sit to/from stand: with contact guard assist Goal: Pt Will Transfer Bed To Chair/Chair To Bed Outcome: Progressing Flowsheets (Taken 03/15/2024 1226) Pt will Transfer Bed to Chair/Chair to Bed: with supervision Goal: Pt Will Ambulate Outcome: Progressing Flowsheets (Taken 03/15/2024 1226) Pt will Ambulate:  > 125 feet  with supervision  with rolling walker Goal: Pt Will Go Up/Down Stairs Outcome: Progressing Flowsheets (Taken 03/15/2024 1226) Pt will Go Up / Down Stairs:  3-5 stairs  with rail(s)  with contact guard assist    12:27 PM, 03/15/24 Karenna Romanoff Powell-Butler, PT, DPT [place] with [facility]

## 2024-03-15 NOTE — ED Provider Notes (Signed)
 [facility] MEDICAL SURGICAL UNIT Provider Note   CSN: [id number] Arrival date & time: 03/14/24  1831     Patient presents with: Hematuria   [name] is a 73 y.o. male.   Patient has a history of prostate cancer.  He presents today with blood in his urine.  The history is provided by the patient and medical records. No language interpreter was used.  Hematuria This is a recurrent problem. The current episode started 12 to 24 hours ago. The problem occurs constantly. The problem has not changed since onset.Pertinent negatives include no chest pain, no abdominal pain and no headaches. Nothing aggravates the symptoms. Nothing relieves the symptoms. He has tried nothing for the symptoms.       Prior to Admission medications   Medication Sig Start Date End Date Taking? Authorizing Provider  Bioflavonoid Products (ESTER C PO) Take 60 mg by mouth in the morning and at bedtime.    [provider]  BRUKINSA 80 MG capsule Take 160 mg by mouth 2 (two) times daily. Patient taking differently: Take 80 mg by mouth 2 (two) times daily.    [provider]  Calcium  Carbonate-Vitamin D  600-10 MG-MCG TABS Take 1 tablet by mouth 2 (two) times daily.     [provider]  cyclobenzaprine  (FLEXERIL ) 10 MG tablet Take 10 mg by mouth in the morning and at bedtime.    [provider]  dexlansoprazole (DEXILANT) 60 MG capsule Take 60 mg by mouth daily.     [provider]  diazepam  (VALIUM ) 10 MG tablet Take 10 mg by mouth in the morning and at bedtime. Patient taking differently: Take 10 mg by mouth 3 (three) times daily.    [provider]  diltiazem  (CARDIZEM ) 30 MG tablet Take 30 mg by mouth 2 (two) times daily.    [provider]  fexofenadine (ALLEGRA) 180 MG tablet Take 180 mg by mouth every evening.    [provider]  fish oil-omega-3 fatty acids  1000 MG capsule Take 1 g by mouth 2 (two) times daily.    [provider]  Lactobacillus (ACIDOPHILUS) TABS Take by mouth. Patient taking differently: Take by mouth every morning. 02/04/23   [provider]  lisinopril  (ZESTRIL ) 10 MG tablet Take 10 mg by mouth daily.    [provider]  mirabegron ER (MYRBETRIQ) 25 MG TB24 tablet Take 25 mg by mouth daily. Patient taking differently: Take 50 mg by mouth daily.    [provider]  montelukast  (SINGULAIR ) 10 MG tablet Take 10 mg by mouth daily.     [provider]  Multiple Vitamin (MULITIVITAMIN WITH MINERALS) TABS Take 1 tablet by mouth daily.    [provider]  NUBEQA 300 MG tablet Take 600 mg by mouth 2 (two) times daily.    [provider]  ondansetron  (ZOFRAN ) 8 MG tablet Take 8 mg by mouth as needed for nausea or vomiting.    [provider]  oxyCODONE -acetaminophen  (PERCOCET) 10-325 MG tablet Take by mouth. Patient taking differently: Take by mouth as needed.    [provider]  Probiotic Product (PROBIOTIC PO) Take 1 tablet by mouth daily.    [provider]  ranolazine  (RANEXA ) 500 MG 12 hr tablet Take 500 mg by mouth 2 (two) times daily. 04/23/20   [provider]  rivaroxaban  (XARELTO ) 20 MG TABS tablet Take 10 mg by mouth daily.    [provider]  rosuvastatin  (CRESTOR ) 10 MG tablet  Take 5 mg by mouth daily.    [provider]  tamsulosin (FLOMAX) 0.4 MG CAPS capsule Take 0.4 mg by mouth daily after supper.    [provider]  metoCLOPramide  (REGLAN ) 10 MG tablet Take 1 tablet by mouth in the morning and at bedtime. 08/03/20 02/22/24  [provider]    Allergies: Demerol    Review of Systems  Constitutional:  Negative for appetite change and fatigue.  HENT:  Negative for congestion, ear discharge and sinus pressure.   Eyes:  Negative for discharge.  Respiratory:  Negative for cough.   Cardiovascular:  Negative for chest pain.  Gastrointestinal:  Negative for  abdominal pain and diarrhea.  Genitourinary:  Positive for hematuria. Negative for frequency.  Musculoskeletal:  Negative for back pain.  Skin:  Negative for rash.  Neurological:  Negative for seizures and headaches.  Psychiatric/Behavioral:  Negative for hallucinations.     Updated Vital Signs BP 116/66   Pulse (!) 59   Temp 98.1 F (36.7 C) (Axillary)   Resp 18   Wt 106.7 kg   SpO2 96%   BMI 32.81 kg/m   Physical Exam Vitals and nursing note reviewed.  Constitutional:      Appearance: He is well-developed.  HENT:     Head: Normocephalic.     Nose: Nose normal.  Eyes:     General: No scleral icterus.    Conjunctiva/sclera: Conjunctivae normal.  Neck:     Thyroid : No thyromegaly.  Cardiovascular:     Rate and Rhythm: Normal rate and regular rhythm.     Heart sounds: No murmur heard.    No friction rub. No gallop.  Pulmonary:     Breath sounds: No stridor. No wheezing or rales.  Chest:     Chest wall: No tenderness.  Abdominal:     General: There is no distension.     Tenderness: There is no abdominal tenderness. There is no rebound.  Musculoskeletal:        General: Normal range of motion.     Cervical back: Neck supple.  Lymphadenopathy:     Cervical: No cervical adenopathy.  Skin:    Findings: No erythema or rash.  Neurological:     Mental Status: He is alert and oriented to person, place, and time.     Motor: No abnormal muscle tone.     Coordination: Coordination normal.  Psychiatric:        Behavior: Behavior normal.     (all labs ordered are listed, but only abnormal results are displayed) Labs Reviewed  CBC WITH DIFFERENTIAL/PLATELET - Abnormal; Notable for the following components:      Result Value   WBC 2.7 (*)    RBC 3.44 (*)    Hemoglobin 11.1 (*)    HCT 32.9 (*)    Platelets 47 (*)    Lymphs Abs 0.6 (*)    All other components within normal limits  COMPREHENSIVE METABOLIC PANEL WITH GFR - Abnormal; Notable for the following components:    Sodium 151 (*)    Glucose, Bld 107 (*)    BUN 29 (*)    Creatinine, Ser 1.26 (*)    Calcium  8.7 (*)    Total Protein 5.8 (*)    Anion gap 17 (*)    All other components within normal limits  URINALYSIS, ROUTINE W REFLEX MICROSCOPIC - Abnormal; Notable for the following components:   Color, Urine AMBER (*)    APPearance CLOUDY (*)    Hgb urine  dipstick LARGE (*)    Protein, ur 100 (*)    All other components within normal limits  BASIC METABOLIC PANEL WITH GFR - Abnormal; Notable for the following components:   Sodium 150 (*)    Glucose, Bld 107 (*)    BUN 28 (*)    Calcium  8.1 (*)    All other components within normal limits  CBC - Abnormal; Notable for the following components:   WBC 2.7 (*)    RBC 3.04 (*)    Hemoglobin 9.8 (*)    HCT 29.7 (*)    Platelets 40 (*)    All other components within normal limits  URINE CULTURE  BASIC METABOLIC PANEL WITH GFR  HEMOGLOBIN AND HEMATOCRIT, BLOOD    EKG: None  Radiology: No results found.   Procedures   Medications Ordered in the ED  diazepam  (VALIUM ) tablet 10 mg (10 mg Oral Given 03/15/24 0118)  acetaminophen  (TYLENOL ) tablet 650 mg (has no administration in time range)    Or  acetaminophen  (TYLENOL ) suppository 650 mg (has no administration in time range)  0.45 % sodium chloride  infusion (0 mLs Intravenous Stopped 03/15/24 1028)  senna-docusate (Senokot-S) tablet 1 tablet (1 tablet Oral Given 03/15/24 0118)  prochlorperazine  (COMPAZINE ) injection 5 mg (has no administration in time range)  Chlorhexidine  Gluconate Cloth 2 % PADS 6 each (6 each Topical Given 03/15/24 0903)  Oral care mouth rinse (has no administration in time range)  cefTRIAXone  (ROCEPHIN ) 1 g in sodium chloride  0.9 % 100 mL IVPB (has no administration in time range)  sodium chloride  0.9 % bolus 1,000 mL (0 mLs Intravenous Stopped 03/14/24 2217)  cefTRIAXone  (ROCEPHIN ) 2 g in sodium chloride  0.9 % 100 mL IVPB (0 g Intravenous Stopped 03/14/24 2208)                                     Medical Decision Making Amount and/or Complexity of Data Reviewed Labs: ordered.  Risk Decision regarding hospitalization.   Patient with hematuria and hypernatremia.  He has had a urine culture done and a Foley placed.  He will be admitted to medicine     Final diagnoses:  Cystitis  Hypernatremia    ED Discharge Orders     None          Suzette Pac, MD 03/15/24 1040

## 2024-03-15 NOTE — TOC Initial Note (Signed)
 Transition of Care [facility]) - Initial/Assessment Note    Patient Details  Name: [name] MRN: [id number] Date of Birth: 1950-07-21  Transition [facility]) CM/SW Contact:    [name], LCSWA Phone Number: 03/15/2024, 2:01 PM  Clinical Narrative:                  CSW spoke with patient about PT recommendation for OPPT vs. HH. Patient stated that he would think about which once he would prefer. Patient reports that it is him and his wife in the home and that he is independent. He reports still being able to drive and uses a walker, cane, or walking stick depending on the day. CSW will continue to follow.    Barriers to Discharge: Continued Medical Work up   Patient Goals and CMS Choice Patient states their goals for this hospitalization and ongoing recovery are:: DC back home CMS Medicare.gov Compare Post Acute Care list provided to:: Patient Choice offered to / list presented to : Patient      Expected Discharge Plan and Services     Post Acute Care Choice: Durable Medical Equipment Living arrangements for the past 2 months: Single Family Home                                      Prior Living Arrangements/Services Living arrangements for the past 2 months: Single Family Home Lives with:: Spouse Patient language and need for interpreter reviewed:: Yes Do you feel safe going back to the place where you live?: Yes      Need for Family Participation in Patient Care: No (Comment) Care giver support system in place?: No (comment) Current home services: DME Criminal Activity/Legal Involvement Pertinent to Current Situation/Hospitalization: No - Comment as needed  Activities of Daily Living   ADL Screening (condition at time of admission) Independently performs ADLs?: No Does the patient have a NEW difficulty with bathing/dressing/toileting/self-feeding that is expected to last >3 days?: Yes (Initiates electronic notice to provider for possible OT  consult) Does the patient have a NEW difficulty with getting in/out of bed, walking, or climbing stairs that is expected to last >3 days?: Yes (Initiates electronic notice to provider for possible PT consult) Does the patient have a NEW difficulty with communication that is expected to last >3 days?: No Is the patient deaf or have difficulty hearing?: No Does the patient have difficulty seeing, even when wearing glasses/contacts?: No Does the patient have difficulty concentrating, remembering, or making decisions?: No  Permission Sought/Granted      Share Information with NAME: [name]     Permission granted to share info w Relationship: Patient     Emotional Assessment Appearance:: Appears stated age Attitude/Demeanor/Rapport: Engaged Affect (typically observed): Accepting Orientation: : Oriented to Self, Oriented to Place, Oriented to  Time, Oriented to Situation Alcohol / Substance Use: Not Applicable Psych Involvement: No (comment)  Admission diagnosis:  Hypernatremia [E87.0] Gross hematuria [R31.0] Cystitis [N30.90] Patient Active Problem List   Diagnosis Date Noted   Hypernatremia 03/15/2024   Prostate cancer (HCC) 03/15/2024   Gross hematuria 03/14/2024   Right flank pain 02/22/2024   RLQ abdominal pain 02/22/2024   Hematuria 01/08/2023   History of pulmonary embolus (PE) 01/08/2023   Pancytopenia (HCC) 01/08/2023   Chronic kidney disease, stage 3a (HCC) 01/08/2023   Hyperlipidemia 01/08/2023   AAA (abdominal aortic aneurysm) 01/08/2023   Delirium  Acute metabolic encephalopathy 02/24/2022   Acute encephalopathy 02/23/2022   Sepsis due to undetermined organism (HCC) 02/23/2022   Volume depletion 02/12/2022   Acute respiratory failure with hypoxia (HCC) 02/12/2022   Paroxysmal atrial fibrillation (HCC) 02/12/2022   Multifocal pneumonia    GI bleeding 02/05/2022   IBS (irritable bowel syndrome) 11/18/2021   Acute pulmonary embolism (HCC) 05/05/2021   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   Fatty liver 08/14/2020   Acute ITP (HCC) 05/09/2020   Constipation 05/24/2019   Pancreatic calcification 05/24/2019   Gastroparesis 01/04/2019   N&V (nausea and vomiting) 11/17/2018   Debility 11/17/2018   Special screening for malignant neoplasms, colon 01/26/2018   Barrett's esophagus without dysplasia 01/26/2018   Thrombocytopenia 04/02/2017   Lumbar stenosis with neurogenic claudication 11/03/2016   Intractable vomiting with nausea 05/29/2016   OSA (obstructive sleep apnea) 11/18/2013   Liver lesion 03/01/2013   Barrett's esophagus 03/01/2013   GERD (gastroesophageal reflux disease) 03/01/2013   Essential hypertension, benign 03/01/2013   S/P lumbar fusion 03/25/2012   Back pain without radiation 03/25/2012   PCP:  Maree Isles, MD Pharmacy:   [facility] [place], [place] - [address] [place] [address] Phone: [phone] Fax: [phone]  [facility] - [place], [place] - [address] [place] [address] Phone: [phone] Fax: [phone]     Social Drivers of Health (SDOH) Social History: SDOH Screenings   Food Insecurity: No Food Insecurity (03/15/2024)  Housing: Low Risk  (03/15/2024)  Transportation Needs: No Transportation Needs (03/15/2024)  Utilities: Not At Risk (03/15/2024)  Alcohol Screen: Low Risk  (05/09/2020)  Depression (PHQ2-9): Low Risk  (05/09/2020)  Financial Resource Strain: Low Risk  (05/09/2020)  Physical Activity: Inactive (05/09/2020)  Social Connections: Moderately Integrated (03/15/2024)  Stress: No Stress Concern Present (05/09/2020)  Tobacco Use: Medium Risk (03/15/2024)   SDOH Interventions:     Readmission Risk Interventions    02/24/2022    2:01 PM  Readmission Risk Prevention Plan  Transportation Screening Complete  HRI or Home Care Consult Complete  Social Work Consult for  Recovery Care Planning/Counseling Complete  Palliative Care Screening Not Applicable  Medication Review [employer]) Complete

## 2024-03-16 DIAGNOSIS — R310 Gross hematuria: Secondary | ICD-10-CM | POA: Diagnosis not present

## 2024-03-16 LAB — CBC
HCT: 30 % — ABNORMAL LOW (ref 39.0–52.0)
Hemoglobin: 9.9 g/dL — ABNORMAL LOW (ref 13.0–17.0)
MCH: 31.8 pg (ref 26.0–34.0)
MCHC: 33 g/dL (ref 30.0–36.0)
MCV: 96.5 fL (ref 80.0–100.0)
Platelets: 42 K/uL — ABNORMAL LOW (ref 150–400)
RBC: 3.11 MIL/uL — ABNORMAL LOW (ref 4.22–5.81)
RDW: 13.2 % (ref 11.5–15.5)
WBC: 3 K/uL — ABNORMAL LOW (ref 4.0–10.5)
nRBC: 0 % (ref 0.0–0.2)

## 2024-03-16 LAB — BASIC METABOLIC PANEL WITH GFR
Anion gap: 8 (ref 5–15)
BUN: 22 mg/dL (ref 8–23)
CO2: 25 mmol/L (ref 22–32)
Calcium: 8.5 mg/dL — ABNORMAL LOW (ref 8.9–10.3)
Chloride: 109 mmol/L (ref 98–111)
Creatinine, Ser: 0.98 mg/dL (ref 0.61–1.24)
GFR, Estimated: >60 mL/min (ref >60)
Glucose, Bld: 89 mg/dL (ref 70–99)
Potassium: 3.8 mmol/L (ref 3.5–5.1)
Sodium: 142 mmol/L (ref 135–145)

## 2024-03-16 LAB — URINE CULTURE: Culture: NO GROWTH

## 2024-03-16 NOTE — Discharge Summary (Signed)
 Physician Discharge Summary  [name] FMW:[id number] DOB: 1951/05/20 DOA: 03/14/2024  PCP: Maree Isles, MD  Admit date: 03/14/2024 Discharge date: 03/16/2024  Admitted From: Home Disposition: Home  Recommendations for Outpatient Follow-up:  Follow up with PCP in 1 week with repeat platelets Follow up in ED if symptoms worsen or new appear follow-up with outpatient oncology/urology  Discharge Condition: Stable CODE STATUS: Full Diet recommendation: Heart healthy  Brief/Interim Summary:  [name] is a 73 y.o. male with medical history significant for CAD, OSA, prostate cancer metastatic to bone, splenic marginal zone lymphoma, and history of PE on Xarelto  who presents with gross hematuria.  Foley placed in ED with drainage of dark red urine.  Xarelto  was kept on hold.  Hematuria subsequently resolved in next 24 hours or so. Foley catheter was discontinued prior to DC and he was able to void He will need to follow urology, [facility] oncology/ hematology upon discharge. Patient is recommended to hold Xarelto  if platelet count less than 50,000 with bleeding. He is recommended to follow-up with PCP within a week and check platelet counts. Patient received IV ceftriaxone  with concern for UTI however UA is underwhelming for UTI and also urine culture negative.  Antibiotics were not prescribed on discharge.  Other hospital issues include Hypernatremia  Serum sodium is 151 in setting of hypovolemia, improved to 143 with IV hydration.  Pancytopenia  Thrombocytopenia, as above.  Platelet count 43K on discharge.  WBC count 3.0.  Follow-up outpatient  Hx of PE  Hold Xarelto  given platelet count <50k and hematuria. He is advised to hold it and talk to [facility] hematology regarding further directions. Daughter is aware to call primary hematology.   Marginal zone lymphoma of spleen /prostate cancer Followed by oncology at [facility] and managed with Brukinsa and  Nubeqa respectively.  Follow  outpatient oncology recommendations.    CKD 3A  Appears close to baseline    Obesity class I BM1 32.81 Diet, exercise and weight reduction advised. CPAP while sleeping for OSA.  Discharge Diagnoses:  Principal Problem:   Gross hematuria Active Problems:   History of pulmonary embolus (PE)   Marginal zone lymphoma of spleen (HCC)   Pancytopenia (HCC)   Essential hypertension, benign   Chronic kidney disease, stage 3a (HCC)   OSA (obstructive sleep apnea)   Hypernatremia   Prostate cancer [facility])    Discharge Instructions  Discharge Instructions     Diet - low sodium heart healthy   Complete by: As directed    Discharge instructions   Complete by: As directed    1.  Follow-up with primary care provider within a week.  We recommend laboratory work to check platelet count. 2.  Consider resuming Xarelto  if platelet count more than 50,000 and no more hematuria.  Please continue to follow-up with your outpatient oncology/urology   Increase activity slowly   Complete by: As directed       Allergies as of 03/16/2024       Reactions   Demerol Other (See Comments)   Makes me crazy.        Medication List     PAUSE taking these medications    rivaroxaban  20 MG Tabs tablet Wait to take this until your doctor or other care provider tells you to start again. Commonly known as: XARELTO  Take 10 mg by mouth daily.       TAKE these medications    Brukinsa 80 MG capsule Generic drug: zanubrutinib Take 160 mg by mouth 2 (  two) times daily. What changed: how much to take   Calcium  Carbonate-Vitamin D  600-10 MG-MCG Tabs Take 1 tablet by mouth 2 (two) times daily.   cyclobenzaprine  10 MG tablet Commonly known as: FLEXERIL  Take 10 mg by mouth in the morning and at bedtime.   Dexilant 60 MG capsule Generic drug: dexlansoprazole Take 60 mg by mouth daily.   diazepam  10 MG tablet Commonly known as: VALIUM  Take 10 mg by mouth in the morning and at bedtime. What  changed: when to take this   diltiazem  30 MG tablet Commonly known as: CARDIZEM  Take 30 mg by mouth 2 (two) times daily.   ESTER C PO Take 60 mg by mouth daily.   fexofenadine 180 MG tablet Commonly known as: ALLEGRA Take 180 mg by mouth every evening.   fish oil-omega-3 fatty acids  1000 MG capsule Take 1 g by mouth 2 (two) times daily.   lisinopril  10 MG tablet Commonly known as: ZESTRIL  Take 10 mg by mouth daily.   meclizine 25 MG tablet Commonly known as: ANTIVERT Take 25 mg by mouth 3 (three) times daily as needed for dizziness.   montelukast  10 MG tablet Commonly known as: SINGULAIR  Take 10 mg by mouth daily.   multivitamin with minerals Tabs tablet Take 1 tablet by mouth daily.   Myrbetriq 25 MG Tb24 tablet Generic drug: mirabegron ER Take 25 mg by mouth daily.   Nubeqa 300 MG tablet Generic drug: darolutamide Take 600 mg by mouth 2 (two) times daily.   ondansetron  8 MG tablet Commonly known as: ZOFRAN  Take 8 mg by mouth as needed for nausea or vomiting.   oxyCODONE -acetaminophen  10-325 MG tablet Commonly known as: PERCOCET Take by mouth. What changed:  how much to take when to take this reasons to take this   PROBIOTIC PO Take 1 tablet by mouth daily.   ranolazine  500 MG 12 hr tablet Commonly known as: RANEXA  Take 500 mg by mouth 2 (two) times daily.   rosuvastatin  10 MG tablet Commonly known as: CRESTOR  Take 5 mg by mouth daily.   tamsulosin 0.4 MG Caps capsule Commonly known as: FLOMAX Take 0.4 mg by mouth daily after supper.        Follow-up Information     Maree Isles, MD. Schedule an appointment as soon as possible for a visit in 3 day(s).   Specialty: Internal Medicine Contact information: [address] [place] [address] [phone]                Allergies  Allergen Reactions   Demerol Other (See Comments)    Makes me crazy.    Consultations:    Procedures/Studies: DG Knee Complete 4 Views Right Result  Date: 03/11/2024 CLINICAL DATA:  Status post fall. EXAM: RIGHT KNEE - COMPLETE 4+ VIEW COMPARISON:  None Available. FINDINGS: No evidence of an acute fracture or dislocation. A small chronic deformity is seen along the dorsal aspect of the right patella. Mild tricompartmental degenerative changes are present. A small suprapatellar effusion is noted. IMPRESSION: 1. Mild tricompartmental degenerative changes. 2. Small suprapatellar effusion. Electronically Signed   By: Suzen Dials M.D.   On: 03/11/2024 13:31   CT Head Wo Contrast Result Date: 03/11/2024 EXAM: CT HEAD WITHOUT CONTRAST 03/11/2024 12:49:11 PM TECHNIQUE: CT of the head was performed without the administration of intravenous contrast. Automated exposure control, iterative reconstruction, and/or weight based adjustment of the mA/kV was utilized to reduce the radiation dose to as low as reasonably achievable. COMPARISON: CT Head 12/11/2023.  CLINICAL HISTORY: 73 year old male. Evaluation for bleed in setting of fall and anticoagulation. Patient denies hitting head. FINDINGS: BRAIN AND VENTRICLES: Normal for age non contrast CT appearance of the brain. Stable cerebral volume. Gray white differentiation stable and within normal limits for age. Calcified atherosclerosis at the skull base. No suspicious intracranial vascular hyperdensity. No acute hemorrhage. No evidence of acute infarct. No hydrocephalus. No extra-axial collection. No mass effect or midline shift. ORBITS: No acute orbital injury. SINUSES: No acute abnormality. SOFT TISSUES AND SKULL: No acute scalp soft tissue injury. No skull fracture. IMPRESSION: 1. No acute traumatic injury identified 2. Normal for age non-contrast CT appearance of the brain. Electronically signed by: Helayne Hurst MD 03/11/2024 12:55 PM EDT RP Workstation: [id number]   CT ABDOMEN PELVIS W CONTRAST Result Date: 03/04/2024 CLINICAL DATA:  Right flank pain. History of metastatic prostate cancer. EXAM: CT ABDOMEN AND  PELVIS WITH CONTRAST TECHNIQUE: Multidetector CT imaging of the abdomen and pelvis was performed using the standard protocol following bolus administration of intravenous contrast. RADIATION DOSE REDUCTION: This exam was performed according to the departmental dose-optimization program which includes automated exposure control, adjustment of the mA and/or kV according to patient size and/or use of iterative reconstruction technique. CONTRAST:  OMNIPAQUE  IOHEXOL  300 MG/ML  SOLN COMPARISON:  April 09, 2023. FINDINGS: Lower chest: No acute abnormality. Hepatobiliary: No focal liver abnormality is seen. No gallstones, gallbladder wall thickening, or biliary dilatation. Pancreas: Unremarkable. No pancreatic ductal dilatation or surrounding inflammatory changes. Spleen: Normal in size without focal abnormality. Adrenals/Urinary Tract: Adrenal glands appear normal. Left parapelvic cyst is noted. No hydronephrosis or renal obstruction is noted. Urinary bladder is unremarkable. Stomach/Bowel: Moderate-sized hiatal hernia is noted with associated postsurgical changes. No evidence of bowel obstruction or inflammation. The appendix is unremarkable. Stool is noted throughout the colon. Vascular/Lymphatic: Aortic atherosclerosis. No enlarged abdominal or pelvic lymph nodes. Reproductive: Prostate is unremarkable. Other: Small fat containing right inguinal hernia. No ascites is noted. Musculoskeletal: Status post left total hip arthroplasty. Extensive postsurgical changes are seen involving lumbar spine. No acute osseous abnormality is noted. IMPRESSION: 1. Moderate size hiatal hernia is noted with associated postsurgical changes. 2. Small fat containing right inguinal hernia. 3. No acute abnormality seen in the abdomen or pelvis. 4. Aortic atherosclerosis. Aortic Atherosclerosis (ICD10-I70.0). Electronically Signed   By: Lynwood Landy Raddle M.D.   On: 03/04/2024 13:11      Subjective:   Discharge Exam: Vitals:    03/16/24 0514 03/16/24 0924  BP: 120/60 130/79  Pulse: 61 63  Resp:  18  Temp: 98 F (36.7 C) 98.1 F (36.7 C)  SpO2: 92% 94%    General: Pt is alert, awake, not in acute distress Cardiovascular: rate controlled, S1/S2 + Respiratory: bilateral decreased breath sounds at bases Abdominal: Soft, NT, ND, bowel sounds + Extremities: no edema, no cyanosis    The results of significant diagnostics from this hospitalization (including imaging, microbiology, ancillary and laboratory) are listed below for reference.     Microbiology: Recent Results (from the past 240 hours)  Urine Culture     Status: None   Collection Time: 03/14/24  9:06 PM   Specimen: Urine, Catheterized  Result Value Ref Range Status   Specimen Description   Final    URINE, CATHETERIZED Performed at [facility], [address], [place], [place] [address]    Special Requests   Final    NONE Performed at [facility], [address], [place], [place] [address]  Culture   Final    NO GROWTH Performed at [facility], [address], [place], [place] [address]    Report Status 03/16/2024 FINAL  Final     Labs: BNP (last 3 results) No results for input(s): BNP in the last 8760 hours. Basic Metabolic Panel: Recent Labs  Lab 03/14/24 1923 03/15/24 0432 03/15/24 1726 03/16/24 0438  NA 151* 150* 144 142  K 3.7 3.5 4.2 3.8  CL 110 111 110 109  CO2 25 25 25 25   GLUCOSE 107* 107* 94 89  BUN 29* 28* 22 22  CREATININE 1.26* 1.14 1.14 0.98  CALCIUM  8.7* 8.1* 8.5* 8.5*   Liver Function Tests: Recent Labs  Lab 03/14/24 1923  AST 23  ALT 15  ALKPHOS 43  BILITOT 0.8  PROT 5.8*  ALBUMIN 4.0   No results for input(s): LIPASE, AMYLASE in the last 168 hours. No results for input(s): AMMONIA in the last 168 hours. CBC: Recent Labs  Lab 03/14/24 1923 03/15/24 0432 03/15/24 1726 03/16/24 0438  WBC 2.7* 2.7*  --  3.0*  NEUTROABS 1.7  --   --   --   HGB 11.1* 9.8* 10.1* 9.9*  HCT 32.9*  29.7* 31.3* 30.0*  MCV 95.6 97.7  --  96.5  PLT 47* 40*  --  42*   Cardiac Enzymes: No results for input(s): CKTOTAL, CKMB, CKMBINDEX, TROPONINI in the last 168 hours. BNP: Invalid input(s): POCBNP CBG: No results for input(s): GLUCAP in the last 168 hours. D-Dimer No results for input(s): DDIMER in the last 72 hours. Hgb A1c No results for input(s): HGBA1C in the last 72 hours. Lipid Profile No results for input(s): CHOL, HDL, LDLCALC, TRIG, CHOLHDL, LDLDIRECT in the last 72 hours. Thyroid  function studies No results for input(s): TSH, T4TOTAL, T3FREE, THYROIDAB in the last 72 hours.  Invalid input(s): FREET3 Anemia work up No results for input(s): VITAMINB12, FOLATE, FERRITIN, TIBC, IRON, RETICCTPCT in the last 72 hours. Urinalysis    Component Value Date/Time   COLORURINE AMBER (A) 03/14/2024 2106   APPEARANCEUR CLOUDY (A) 03/14/2024 2106   LABSPEC 1.023 03/14/2024 2106   PHURINE 6.0 03/14/2024 2106   GLUCOSEU NEGATIVE 03/14/2024 2106   HGBUR LARGE (A) 03/14/2024 2106   BILIRUBINUR NEGATIVE 03/14/2024 2106   KETONESUR NEGATIVE 03/14/2024 2106   PROTEINUR 100 (A) 03/14/2024 2106   UROBILINOGEN 0.2 07/16/2012 1829   NITRITE NEGATIVE 03/14/2024 2106   LEUKOCYTESUR NEGATIVE 03/14/2024 2106   Sepsis Labs Recent Labs  Lab 03/14/24 1923 03/15/24 0432 03/16/24 0438  WBC 2.7* 2.7* 3.0*   Microbiology Recent Results (from the past 240 hours)  Urine Culture     Status: None   Collection Time: 03/14/24  9:06 PM   Specimen: Urine, Catheterized  Result Value Ref Range Status   Specimen Description   Final    URINE, CATHETERIZED Performed at [facility], [address], [place], [place] [address]    Special Requests   Final    NONE Performed at [facility], [address], [place], [place] [address]    Culture   Final    NO GROWTH Performed at [facility], [address] [place], [place] [address]    Report  Status 03/16/2024 FINAL  Final     Time coordinating discharge: 35 minutes  SIGNED:   Derryl Duval, MD  [facility] 03/16/2024, 10:58 AM

## 2024-03-16 NOTE — Evaluation (Signed)
 Occupational Therapy Evaluation Patient Details Name: [name] MRN: [id number] DOB: 02-24-1951 Today's Date: 03/16/2024   History of Present Illness   [name] is a 73 y.o. male with medical history significant for CAD, OSA, prostate cancer metastatic to bone, splenic marginal zone lymphoma, and history of PE on Xarelto  who presents with gross hematuria.     Patient noted blood in his urine 2 days ago which has been persistent.  He denies any new dysuria, abdominal pain, flank pain, fevers, or chills.  Patient reports that he feels well but his daughter is very concerned, stating that he had severe sepsis in the past and the first sign at that time was gross hematuria.  Patient continues to take Xarelto  which had been decreased from 20 mg to 10 mg due to history of bleeding.  He denies any recent chest pain or shortness of breath. (per MD)     Clinical Impressions Pt agreeable to OT evaluation. Pt's family present and also providing background information. Pt reports some assist at times for lower body dressing, but mostly independent for ADL's at baseline. Today pt was unable to don his own socks seated at the EOB. Min A to CGA for sit to stand and ambulation today. Mildly unsteady with rushed movement at first when ambulating with the RW. Mod A needed for bed mobility. Pt left in the bed with call bell within reach and bed alarm set. Pt will benefit from continued OT in the hospital to increase strength, balance, and endurance for safe ADL's.        If plan is discharge home, recommend the following:   A little help with walking and/or transfers;A lot of help with bathing/dressing/bathroom;Assistance with cooking/housework;Assist for transportation;Help with stairs or ramp for entrance     Functional Status Assessment   Patient has had a recent decline in their functional status and demonstrates the ability to make significant improvements in function in a reasonable and  predictable amount of time.     Equipment Recommendations   None recommended by OT             Precautions/Restrictions   Precautions Precautions: Fall Recall of Precautions/Restrictions: Intact Restrictions Weight Bearing Restrictions Per Provider Order: No     Mobility Bed Mobility Overal bed mobility: Needs Assistance Bed Mobility: Supine to Sit     Supine to sit: Mod assist     General bed mobility comments: Assist to pull to sit with HOB flat. Labored movement.    Transfers Overall transfer level: Needs assistance Equipment used: Rolling walker (2 wheels) Transfers: Sit to/from Stand Sit to Stand: Contact guard assist, Min assist           General transfer comment: Sit to stand from EOB with CGA to min A. More CGA for sit to stand from toilet with RW.      Balance Overall balance assessment: Needs assistance Sitting-balance support: Feet supported, Bilateral upper extremity supported Sitting balance-Leahy Scale: Fair Sitting balance - Comments: Seated EOB   Standing balance support: During functional activity, Bilateral upper extremity supported Standing balance-Leahy Scale: Fair Standing balance comment: w/ RW                           ADL either performed or assessed with clinical judgement   ADL Overall ADL's : Needs assistance/impaired     Grooming: Contact guard assist;Standing;Minimal assistance   Upper Body Bathing: Set up;Sitting   Lower Body  Bathing: Maximal assistance;Sitting/lateral leans   Upper Body Dressing : Set up;Sitting   Lower Body Dressing: Maximal assistance;Sitting/lateral leans Lower Body Dressing Details (indicate cue type and reason): Required assist to don socks at EOB. Toilet Transfer: Minimal assistance;Contact guard assist;Rolling walker (2 wheels);Ambulation Toilet Transfer Details (indicate cue type and reason): EOB to toilet and back with RW. Toileting- Clothing Manipulation and Hygiene:  Moderate assistance;Sitting/lateral lean       Functional mobility during ADLs: Contact guard assist;Rolling walker (2 wheels);Minimal assistance General ADL Comments: Pt able to ambualte over 100 feet in the hall with RW.     Vision Baseline Vision/History: 1 Wears glasses Ability to See in Adequate Light: 1 Impaired Patient Visual Report: No change from baseline Vision Assessment?: Wears glasses for reading;No apparent visual deficits     Perception Perception: Not tested       Praxis Praxis: Not tested       Pertinent Vitals/Pain Pain Assessment Pain Assessment: Faces Faces Pain Scale: Hurts little more Pain Location: back Pain Descriptors / Indicators: Discomfort Pain Intervention(s): Monitored during session, Repositioned     Extremity/Trunk Assessment Upper Extremity Assessment Upper Extremity Assessment: Overall WFL for tasks assessed   Lower Extremity Assessment Lower Extremity Assessment: Defer to PT evaluation   Cervical / Trunk Assessment Cervical / Trunk Assessment: Kyphotic   Communication Communication Communication: No apparent difficulties   Cognition Arousal: Alert Behavior During Therapy: WFL for tasks assessed/performed Cognition: No apparent impairments                               Following commands: Intact       Cueing  General Comments   Cueing Techniques: Verbal cues;Tactile cues;Visual cues                 Home Living Family/patient expects to be discharged to:: Private residence Living Arrangements: Spouse/significant other Available Help at Discharge: Family;Available 24 hours/day Type of Home: House Home Access: Stairs to enter [employer] of Steps: 4 Entrance Stairs-Rails: Left Home Layout: Two level;Able to live on main level with bedroom/bathroom Alternate Level Stairs-Number of Steps: has 2 steps to get into bathroom   Bathroom Shower/Tub: Chief Strategy Officer:  Standard Bathroom Accessibility: Yes   Home Equipment: Agricultural consultant (2 wheels);Cane - single point;Shower seat;BSC/3in1   Additional Comments: Pt plans to stay with his daughter since his wife also had a fall and recently broke her arm.      Prior Functioning/Environment Prior Level of Function : Needs assist;History of Falls (last six months)       Physical Assist : ADLs (physical);Mobility (physical)   ADLs (physical): Bathing;Dressing;Grooming Mobility Comments: RW use after fall; was not using it prior to fall. ADLs Comments: Pt reports independent ADL's at baseline, but later stated he does get assist with socks at times. Assisted for IADL's by family.    OT Problem List: Decreased activity tolerance;Impaired balance (sitting and/or standing)   OT Treatment/Interventions: Self-care/ADL training;Therapeutic exercise;Therapeutic activities;Patient/family education;Balance training;DME and/or AE instruction      OT Goals(Current goals can be found in the care plan section)   Acute Rehab OT Goals Patient Stated Goal: return home OT Goal Formulation: With patient Time For Goal Achievement: 03/30/24 Potential to Achieve Goals: Good   OT Frequency:  Min 2X/week                  AM-PAC OT 6 Clicks Daily Activity  Outcome Measure Help from another person eating meals?: A Little Help from another person taking care of personal grooming?: A Little Help from another person toileting, which includes using toliet, bedpan, or urinal?: A Little Help from another person bathing (including washing, rinsing, drying)?: A Lot Help from another person to put on and taking off regular upper body clothing?: A Little Help from another person to put on and taking off regular lower body clothing?: A Lot 6 Click Score: 16   End of Session Equipment Utilized During Treatment: Rolling walker (2 wheels);Gait belt  Activity Tolerance: Patient tolerated treatment well Patient  left: in bed;with call bell/phone within reach;with family/visitor present;with bed alarm set  OT Visit Diagnosis: Unsteadiness on feet (R26.81);Other abnormalities of gait and mobility (R26.89);Muscle weakness (generalized) (M62.81);History of falling (Z91.81)                Time: 0946-1000 OT Time Calculation (min): 14 min Charges:  OT General Charges $OT Visit: 1 Visit OT Evaluation $OT Eval Low Complexity: 1 Low  Chasidy Janak OT, MOT  Jayson Person 03/16/2024, 11:04 AM

## 2024-03-16 NOTE — Plan of Care (Signed)
  Problem: Pain Managment: Goal: General experience of comfort will improve and/or be controlled Outcome: Progressing   Problem: Safety: Goal: Ability to remain free from injury will improve Outcome: Progressing

## 2024-03-16 NOTE — Plan of Care (Signed)
  Problem: Acute Rehab OT Goals (only OT should resolve) Goal: Pt. Will Perform Grooming Flowsheets (Taken 03/16/2024 1107) Pt Will Perform Grooming:  with modified independence  standing Goal: Pt. Will Perform Lower Body Bathing Flowsheets (Taken 03/16/2024 1107) Pt Will Perform Lower Body Bathing:  with modified independence  sitting/lateral leans Goal: Pt. Will Perform Lower Body Dressing Flowsheets (Taken 03/16/2024 1107) Pt Will Perform Lower Body Dressing:  with modified independence  sitting/lateral leans Goal: Pt. Will Transfer To Toilet Flowsheets (Taken 03/16/2024 1107) Pt Will Transfer to Toilet:  with modified independence  ambulating Goal: Pt. Will Perform Toileting-Clothing Manipulation Flowsheets (Taken 03/16/2024 1107) Pt Will Perform Toileting - Clothing Manipulation and hygiene:  with modified independence  sit to/from stand  sitting/lateral leans  Lea Baine OT, MOT

## 2024-03-16 NOTE — TOC Transition Note (Signed)
 Transition of Care [facility]) - Discharge Note   Patient Details  Name: [name] MRN: [id number] Date of Birth: Jul 27, 1950  [facility]) CM/SW Contact:  Noreen KATHEE Pinal, LCSWA Phone Number: 03/16/2024, 11:31 AM   Clinical Narrative:     CSW went to visit patient at bedside to follow up with his choice of OPPT vs. HHPT. Patient and daughter agreeable to HHPT and HHOT with [facility] for now until patient is able to go back to OP rehab since his wife is suppose to have surgery soon. CSW asked MD to place orders to [facility]. CSW sent referral to [facility] with [facility]. ICM signing off.    Final next level of care: Home w Home Health Services Barriers to Discharge: Barriers Resolved   Patient Goals and CMS Choice Patient states their goals for this hospitalization and ongoing recovery are:: return back home CMS Medicare.gov Compare Post Acute Care list provided to:: Patient Choice offered to / list presented to : Patient      Discharge Placement                Patient to be transferred to facility by: POV Name of family member notified: Karna - daughter Patient and family notified of of transfer: 03/16/24  Discharge Plan and Services Additional resources added to the After Visit Summary for       Post Acute Care Choice: Durable Medical Equipment                    HH Arranged: PT, OT [facility] Agency: [facility] Date [facility] Agency Contacted: 03/16/24 Time HH Agency Contacted: 1130 Representative spoke with at [facility] Agency: Darleene  Social Drivers of Health (SDOH) Interventions SDOH Screenings   Food Insecurity: No Food Insecurity (03/15/2024)  Housing: Low Risk  (03/15/2024)  Transportation Needs: No Transportation Needs (03/15/2024)  Utilities: Not At Risk (03/15/2024)  Alcohol Screen: Low Risk  (05/09/2020)  Depression (PHQ2-9): Low Risk  (05/09/2020)  Financial Resource Strain: Low Risk  (05/09/2020)  Physical Activity: Inactive (05/09/2020)  Social Connections:  Moderately Integrated (03/15/2024)  Stress: No Stress Concern Present (05/09/2020)  Tobacco Use: Medium Risk (03/15/2024)     Readmission Risk Interventions    02/24/2022    2:01 PM  Readmission Risk Prevention Plan  Transportation Screening Complete  HRI or Home Care Consult Complete  Social Work Consult for Recovery Care Planning/Counseling Complete  Palliative Care Screening Not Applicable  Medication Review Oceanographer) Complete

## 2024-03-18 ENCOUNTER — Encounter (HOSPITAL_COMMUNITY)

## 2024-03-21 ENCOUNTER — Other Ambulatory Visit (HOSPITAL_COMMUNITY)
Admission: RE | Admit: 2024-03-21 | Discharge: 2024-03-21 | Disposition: A | Discharge status: Home / Self Care | Source: Ambulatory Visit | Attending: Internal Medicine | Admitting: Internal Medicine

## 2024-03-21 DIAGNOSIS — C801 Malignant (primary) neoplasm, unspecified: Secondary | ICD-10-CM | POA: Insufficient documentation

## 2024-03-21 LAB — DIFFERENTIAL
Abs Immature Granulocytes: 0.02 K/uL (ref 0.00–0.07)
Basophils Absolute: 0 K/uL (ref 0.0–0.1)
Basophils Relative: 0 %
Eosinophils Absolute: 0 K/uL (ref 0.0–0.5)
Eosinophils Relative: 1 %
Immature Granulocytes: 1 %
Lymphocytes Relative: 12 %
Lymphs Abs: 0.4 K/uL — ABNORMAL LOW (ref 0.7–4.0)
Monocytes Absolute: 0.4 K/uL (ref 0.1–1.0)
Monocytes Relative: 11 %
Neutro Abs: 2.4 K/uL (ref 1.7–7.7)
Neutrophils Relative %: 75 %

## 2024-03-21 LAB — CBC
HCT: 34.6 % — ABNORMAL LOW (ref 39.0–52.0)
Hemoglobin: 11.7 g/dL — ABNORMAL LOW (ref 13.0–17.0)
MCH: 32.3 pg (ref 26.0–34.0)
MCHC: 33.8 g/dL (ref 30.0–36.0)
MCV: 95.6 fL (ref 80.0–100.0)
Platelets: 66 K/uL — ABNORMAL LOW (ref 150–400)
RBC: 3.62 MIL/uL — ABNORMAL LOW (ref 4.22–5.81)
RDW: 13.2 % (ref 11.5–15.5)
WBC: 3.1 K/uL — ABNORMAL LOW (ref 4.0–10.5)
nRBC: 0 % (ref 0.0–0.2)

## 2024-03-21 NOTE — Therapy (Signed)
 PHYSICAL THERAPY DISCHARGE SUMMARY  Visits from Start of Care: 6  Current functional level related to goals / functional outcomes: See last visit on 03/10/24   Remaining deficits: See last visit on 03/10/24   Education / Equipment: HEP   Patient agrees to discharge. Patient goals were not met. Patient is being discharged due to a change in medical status.  8:52 AM, 03/21/24 Rosaria Settler, PT, DPT [facility] [place]

## 2024-03-22 ENCOUNTER — Other Ambulatory Visit (HOSPITAL_COMMUNITY)

## 2024-03-22 ENCOUNTER — Encounter (HOSPITAL_COMMUNITY)

## 2024-03-24 ENCOUNTER — Encounter (HOSPITAL_COMMUNITY): Admitting: Physical Therapy

## 2024-03-24 ENCOUNTER — Encounter (INDEPENDENT_AMBULATORY_CARE_PROVIDER_SITE_OTHER): Payer: Self-pay | Admitting: Gastroenterology

## 2024-03-28 ENCOUNTER — Inpatient Hospital Stay: Attending: Oncology

## 2024-03-28 DIAGNOSIS — C61 Malignant neoplasm of prostate: Secondary | ICD-10-CM | POA: Diagnosis not present

## 2024-03-28 DIAGNOSIS — N1831 Chronic kidney disease, stage 3a: Secondary | ICD-10-CM | POA: Insufficient documentation

## 2024-03-28 DIAGNOSIS — D696 Thrombocytopenia, unspecified: Secondary | ICD-10-CM | POA: Insufficient documentation

## 2024-03-28 DIAGNOSIS — C8307 Small cell B-cell lymphoma, spleen: Secondary | ICD-10-CM | POA: Diagnosis present

## 2024-03-28 DIAGNOSIS — Z79899 Other long term (current) drug therapy: Secondary | ICD-10-CM | POA: Insufficient documentation

## 2024-03-28 DIAGNOSIS — D649 Anemia, unspecified: Secondary | ICD-10-CM | POA: Diagnosis not present

## 2024-03-28 DIAGNOSIS — C7982 Secondary malignant neoplasm of genital organs: Secondary | ICD-10-CM

## 2024-03-28 LAB — COMPREHENSIVE METABOLIC PANEL WITH GFR
ALT: 17 U/L (ref 0–44)
AST: 21 U/L (ref 15–41)
Albumin: 4.7 g/dL (ref 3.5–5.0)
Alkaline Phosphatase: 58 U/L (ref 38–126)
Anion gap: 12 (ref 5–15)
BUN: 25 mg/dL — ABNORMAL HIGH (ref 8–23)
CO2: 25 mmol/L (ref 22–32)
Calcium: 9.4 mg/dL (ref 8.9–10.3)
Chloride: 104 mmol/L (ref 98–111)
Creatinine, Ser: 1.27 mg/dL — ABNORMAL HIGH (ref 0.61–1.24)
GFR, Estimated: >60 mL/min (ref >60)
Glucose, Bld: 138 mg/dL — ABNORMAL HIGH (ref 70–99)
Potassium: 3.9 mmol/L (ref 3.5–5.1)
Sodium: 141 mmol/L (ref 135–145)
Total Bilirubin: 0.9 mg/dL (ref 0.0–1.2)
Total Protein: 7 g/dL (ref 6.5–8.1)

## 2024-03-28 LAB — CBC WITH DIFFERENTIAL/PLATELET
Abs Immature Granulocytes: 0.02 K/uL (ref 0.00–0.07)
Basophils Absolute: 0 K/uL (ref 0.0–0.1)
Basophils Relative: 0 %
Eosinophils Absolute: 0 K/uL (ref 0.0–0.5)
Eosinophils Relative: 1 %
HCT: 38.8 % — ABNORMAL LOW (ref 39.0–52.0)
Hemoglobin: 12.9 g/dL — ABNORMAL LOW (ref 13.0–17.0)
Immature Granulocytes: 1 %
Lymphocytes Relative: 12 %
Lymphs Abs: 0.4 K/uL — ABNORMAL LOW (ref 0.7–4.0)
MCH: 31.9 pg (ref 26.0–34.0)
MCHC: 33.2 g/dL (ref 30.0–36.0)
MCV: 96 fL (ref 80.0–100.0)
Monocytes Absolute: 0.2 K/uL (ref 0.1–1.0)
Monocytes Relative: 7 %
Neutro Abs: 2.5 K/uL (ref 1.7–7.7)
Neutrophils Relative %: 79 %
Platelets: DECREASED K/uL (ref 150–400)
RBC: 4.04 MIL/uL — ABNORMAL LOW (ref 4.22–5.81)
RDW: 13.4 % (ref 11.5–15.5)
WBC: 3.2 K/uL — ABNORMAL LOW (ref 4.0–10.5)
nRBC: 0 % (ref 0.0–0.2)

## 2024-03-28 LAB — LACTATE DEHYDROGENASE: LDH: 167 U/L (ref 98–192)

## 2024-03-28 LAB — PSA: Prostatic Specific Antigen: 0.06 ng/mL (ref 0.00–4.00)

## 2024-03-29 ENCOUNTER — Encounter (HOSPITAL_COMMUNITY)

## 2024-03-31 ENCOUNTER — Encounter (HOSPITAL_COMMUNITY)

## 2024-04-04 ENCOUNTER — Ambulatory Visit: Admitting: Oncology

## 2024-04-05 ENCOUNTER — Encounter (HOSPITAL_COMMUNITY): Admitting: Physical Therapy

## 2024-04-06 ENCOUNTER — Inpatient Hospital Stay

## 2024-04-06 ENCOUNTER — Inpatient Hospital Stay: Attending: Oncology | Admitting: Oncology

## 2024-04-06 VITALS — BP 82/67 | HR 100 | Temp 97.9°F | Resp 18

## 2024-04-06 DIAGNOSIS — E860 Dehydration: Secondary | ICD-10-CM

## 2024-04-06 DIAGNOSIS — C61 Malignant neoplasm of prostate: Secondary | ICD-10-CM | POA: Diagnosis not present

## 2024-04-06 DIAGNOSIS — N189 Chronic kidney disease, unspecified: Secondary | ICD-10-CM | POA: Insufficient documentation

## 2024-04-06 DIAGNOSIS — Z79899 Other long term (current) drug therapy: Secondary | ICD-10-CM | POA: Insufficient documentation

## 2024-04-06 DIAGNOSIS — D696 Thrombocytopenia, unspecified: Secondary | ICD-10-CM | POA: Diagnosis not present

## 2024-04-06 DIAGNOSIS — G629 Polyneuropathy, unspecified: Secondary | ICD-10-CM | POA: Diagnosis not present

## 2024-04-06 DIAGNOSIS — C8307 Small cell B-cell lymphoma, spleen: Secondary | ICD-10-CM | POA: Insufficient documentation

## 2024-04-06 DIAGNOSIS — C7982 Secondary malignant neoplasm of genital organs: Secondary | ICD-10-CM | POA: Diagnosis not present

## 2024-04-06 MED ORDER — SODIUM CHLORIDE 0.9 % IV SOLN: Freq: Once | INTRAVENOUS | Status: AC

## 2024-04-06 NOTE — Progress Notes (Signed)
 Patient to receive 1 liter of saline over 2 hours per providers order.  Peripheral IV started and blood return noted pre and post infusion.  Stable during infusion without adverse affects.  Vital signs stable.  No complaints at this time.  Discharge from clinic ambulatory in stable condition.  Alert and oriented X 3.  Follow up with [facility] as scheduled.

## 2024-04-06 NOTE — Patient Instructions (Signed)
 [facility] at [facility] Discharge Instructions   You were seen and examined today by Dr. Davonna.  She reviewed the results of your lab work which are normal/stable.   We will .   Return as scheduled.    Thank you for choosing [facility] at [facility] to provide your oncology and hematology care.  To afford each patient quality time with our provider, please arrive at least 15 minutes before your scheduled appointment time.   If you have a lab appointment with the Cancer Center please come in thru the Main Entrance and check in at the main information desk.  You need to re-schedule your appointment should you arrive 10 or more minutes late.  We strive to give you quality time with our providers, and arriving late affects you and other patients whose appointments are after yours.  Also, if you no show three or more times for appointments you may be dismissed from the clinic at the providers discretion.     Again, thank you for choosing [facility].  Our hope is that these requests will decrease the amount of time that you wait before being seen by our physicians.       _____________________________________________________________  Should you have questions after your visit to [facility], please contact our office at [phone] and follow the prompts.  Our office hours are 8:00 a.m. and 4:30 p.m. Monday - Friday.  Please note that voicemails left after 4:00 p.m. may not be returned until the following business day.  We are closed weekends and major holidays.  You do have access to a nurse 24-7, just call the main number to the clinic [phone] and do not press any options, hold on the line and a nurse will answer the phone.    For prescription refill requests, have your pharmacy contact our office and allow 72 hours.    Due to Covid, you will need to wear a mask upon entering the hospital. If you do not have a  mask, a mask will be given to you at the Main Entrance upon arrival. For doctor visits, patients may have 1 support person age 75 or older with them. For treatment visits, patients can not have anyone with them due to social distancing guidelines and our immunocompromised population.

## 2024-04-07 ENCOUNTER — Encounter (HOSPITAL_COMMUNITY)

## 2024-04-07 ENCOUNTER — Other Ambulatory Visit (HOSPITAL_COMMUNITY): Payer: Self-pay | Admitting: Internal Medicine

## 2024-04-07 DIAGNOSIS — M25561 Pain in right knee: Secondary | ICD-10-CM

## 2024-04-07 NOTE — Progress Notes (Signed)
 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 male was transferred to my care after his prior physician has left.   ASSESSMENT & PLAN:   Assessment & Plan: DARRIEL UTTER  is a 73 y.o. 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 Significant spleen size reduction indicates positive response.  Patient being managed at [facility] cancer center. - Continue zanubrutinib 160 mg twice daily.  Prostate cancer on darolutamide therapy Prostate cancer managed with Nubeqa. Awaiting jaw doctor clearance for bone-strengthening medications. Vitamin D  and calcium  supplementation ongoing.  Managed by Dr. At [facility] cancer center - Continue Nubeqa 300 mg daily. - Await clearance for bone-strengthening medications. - Continue vitamin D  and calcium  supplementation. - Continue Eligard 45 mg every 6 months  History of bleeding on anticoagulation, currently on apixaban Bleeding episodes led to switch from Xarelto  to apixaban. - Continue apixaban 2.5 mg twice daily.   Thrombocytopenia and anemia Present, possibly related to Nubeqa or lymphoma. Recent blood work showed platelet clumping, previous counts stable. - Continue to monitor blood counts.  Chronic kidney disease Slightly elevated kidney function, possibly due to dehydration. - Encouraged increased water  intake.  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 Slow nerve regeneration, symptoms may persist.  - Continue to monitor  symptoms.  Hypotension Patient with hypotension, dizziness and worsening creatinine.  -Administer IV fluids in infusion clinic today.  We discussed discharging patient at this time considering he has to [facility] center physicians managing his conditions, but patient wants to follow closely locally for ease of care.  Will see the patient back in 6 months.  Will not repeat labs or imaging here as he is getting that done at [facility] cancer center.  The patient understands the plans discussed today and is in agreement with them.  He knows to contact our office if he develops concerns prior to his next appointment.  40 minutes of total time was spent for this patient encounter, including preparation,review of records,  face-to-face counseling with the patient and coordination of care, physical exam, and documentation of the encounter.    LILLETTE Verneta SAUNDERS Teague,acting as a neurosurgeon for Mickiel Dry, MD.,have documented all relevant documentation on the behalf of Mickiel Dry, MD,as directed by  Mickiel Dry, MD while in the presence of Mickiel Dry, MD.  I, Mickiel Dry MD, have reviewed the above documentation for accuracy and completeness, and I agree with the above.    Verneta SAUNDERS Ege  [facility] [place] - A DEPT OF [facility] [address] [place] [address] Dept: [phone] Dept Fax: [phone]   No orders of the defined types were placed in this encounter.    ONCOLOGY HISTORY:   I have reviewed his chart and materials related to his cancer extensively and collaborated history with the patient. Summary of oncologic history is as follows:   Diagnosis: Splenic Marginal Zone Lymphoma (Managed by Dr. Blinda at [facility])  -Presentation: Thrombocytopenia with platelets under 100 K -11/08/2018: CT abdomen: Normal  spleen.  -03/01/2019: Bone Marrow Biopsy.  Pathology: Slightly hypercellular bone marrow  for age with trilineage hematopoiesis including abundant megakaryocytes with predominantly normal morphology. Flow cytometric analysis of the lymphoid population shows a relatively minor population of B cells (16% of lymphocytes) with slight kappa light chain excess but with no expression of CD5, CD10 or CD34. The overall myeloid changes are nonspecific and not diagnostic of a myelodysplastic process. Several lymphoid aggregates present.  Chromosome Analysis: Normal male karyotype  MDS FISH Panel: Normal -01/27/2020: Pulsed dexamethasone , discontinued secondary to sleeplessness -05/14/2020-05/18/2020: IVIG treatment -05/22/2020: Prednisone  60 mg daily, discontinued secondary to sleeplessness -06/04/2020: PET: Splenomegaly with normal metabolic activity. No hypermetabolic nodes in the chest, abdomen, or pelvis. No lymphadenopathy. -06/26/2020: Bone Marrow Biopsy.  Pathology: Mildly hypercellular marrow with lymphoid aggregates. There are scattered large lymphoid aggregates with a predominance of B-cells. Flow cytometry exhibits non-specific binding with light chains, hampering assessment of clonality. However, there is no expression of CD5 or CD10 on the B-cells by flow. Chromosome Analysis: Normal male karyotype  Myeloma FISH Panel: Normal Molecular studies: B-cell gene rearrangement was positive-a clonal immunoglobulin heavy chain (IGH) gene rearrangement is identified. However no clonality is demonstrated in the kappa light chain gene (IGK). -07/31/2020: Peripheral blood and bone marrow.  Pathology: Small B-cell lymphoma (~15% marrow involvement). Normocellular marrow (40%) with maturing trilineage hematopoiesis and no increase in blasts. -08/08/2020: Peripheral blood flow cytometry: 0.4% CD5-/CD10- kappa restricted B cells. 12% atypical T cells, slightly decreased CD5 expression and positive for TCR alpha/beta and CD8. -08/08/2020: Patient evaluated by Dr. Blinda at [facility] and thought to  have low-grade NHL, splenic marginal zone lymphoma type. -09/05/2020-10/03/2020: 4 cycles of rituximab completed -07/10/2021: Peripheral Blood Flow cytometry: No monoclonal B-cell population. No phenotypically abnormal T-cell population.Indeterminate for T-cell clonality by TCRCB1 expression analysis. -09/09/2022: Bone Marrow Biopsy.  Pathology: Low-grade small B-cell lymphoma, nodular pattern, representing ~15% of cellular components, consistent with splenic marginal zone lymphoma. Cellular marrow (40%) with dysmegakaryocytic hypoplasia and no increase in blasts. Flow cytometry analysis shows a small CD5-, CD10-, CD19+ kappa-restricted monoclonal B-cell population. The findings are consistent with focal bone marrow involvement by splenic marginal zone B-cell lymphoma.  -Chromosome Analysis: Normal Male karyotype  Myeloid NGS: TET2 positive -09/19/2022: CTA CAP: No superimposed acute or inflammatory process identified in the chest, abdomen, or pelvis. Chronic Splenomegaly.  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 Increased uptake throughout the right costochondral junction, representing healing fractures.  -07/21/2023: PSA 18.69 -07/21/2023: Total testosterone 340 -07/21/2023-current: Lupron 45 mg every 6 months -08/2023-current: Nubeqa 600 mg twice daily. Dose reduced to 300mg  twice daily for  fatigue, generalized weakness and joint pain -09/2023-current: PSA within normal range and steadily decreasing   Diagnosis: Malignant Melanoma  -10/15/2001: Left Lower Quadrant Abdominal Skin shave biopsy.  Pathology: Malignant melanoma, superficial spreading type. Tumor thickness is 0.95 mm, Clark's Level III. Malignant melanoma  measures approximately 1.5 mm from the peripheral margin.  -10/19/2001: Sentinel lymph node excision.  Pathology: No evidence of  metastatic disease.   Current Treatment: Nubeqa 300 mg twice daily and Zanubrutinib 160 mg daily  INTERVAL HISTORY:   Discussed the use of AI scribe software for clinical note transcription with the patient, who gave verbal consent to proceed.  History of Present Illness [name] is a 73 year old male with prostate cancer and marginal zone lymphoma who presents for routine follow-up and to establish care with me. He is accompanied by his wife.   He has a history of prostate cancer and lymphoma, managed by doctors at [facility], with regular follow-ups every three to six months for cancer monitoring.  He is taking Brukinsa (zanubrutinib) 160 mg twice daily for marginal zone lymphoma. He previously switched from Xarelto  to Eliquis 2.5 mg twice daily due to significant bleeding issues that required hospitalization.  For prostate cancer, he takes Nubeqa (darolutamide) 300 mg daily, along with vitamin D  and calcium  supplements. He is awaiting clearance from his jaw doctor to start bone-strengthening medications and receives shots as needed.  He reports dizziness and low blood pressure, with a recent fall in early October resulting in a knee injury. On October 10th, he was in the emergency department for hematuria and a urinary tract infection. No recent fever, chills, nausea, vomiting, or abdominal pain.  His blood counts are slightly low. Recent platelet clumping made counts unreadable due to platelet clumps, but previous counts were  66.  He lives in [place], which makes it easier for him to manage his healthcare locally.   I have reviewed the past medical history, past surgical history, social history and family history with the patient and they are unchanged from previous note.  ALLERGIES:  is allergic to demerol.  MEDICATIONS:  Current Outpatient Medications  Medication Sig Dispense Refill   apixaban (ELIQUIS) 2.5 MG TABS tablet Take 2.5 mg by mouth.     lidocaine  (LIDODERM ) 5 % Place onto the skin.     Bioflavonoid Products (ESTER C PO) Take 60 mg by mouth daily.     BRUKINSA 80 MG capsule Take 160 mg by mouth 2 (two) times daily. (Patient taking differently: Take 80 mg by mouth 2 (two) times daily.)     Calcium  Carbonate-Vitamin D  600-10 MG-MCG TABS Take 1 tablet by mouth 2 (two) times daily.      cyclobenzaprine  (FLEXERIL ) 10 MG tablet Take 10 mg by mouth in the morning and at bedtime.     dexlansoprazole (DEXILANT) 60 MG capsule Take 60 mg by mouth daily.      diazepam  (VALIUM ) 10 MG tablet Take 10 mg by mouth in the morning and at bedtime. (Patient taking differently: Take 10 mg by mouth 3 (three) times daily.)     diltiazem  (CARDIZEM ) 30 MG tablet Take 30 mg by mouth 2 (two) times daily.     fexofenadine (ALLEGRA) 180 MG tablet Take 180 mg by mouth every evening.     fish oil-omega-3 fatty acids  1000 MG capsule Take 1 g by mouth 2 (two) times daily.     lisinopril  (ZESTRIL ) 10 MG tablet Take 10 mg by mouth daily.     meclizine (ANTIVERT) 25 MG tablet Take 25 mg by mouth 3 (three) times daily as needed for dizziness.     mirabegron ER (MYRBETRIQ) 25 MG TB24 tablet Take 25 mg by mouth daily.     montelukast  (SINGULAIR ) 10 MG tablet Take 10 mg by mouth daily.      Multiple Vitamin (MULITIVITAMIN WITH MINERALS) TABS Take 1 tablet by mouth daily.  NUBEQA 300 MG tablet Take 600 mg by mouth 2 (two) times daily.     ondansetron  (ZOFRAN ) 8 MG tablet Take 8 mg by mouth as needed for nausea or vomiting.      oxyCODONE -acetaminophen  (PERCOCET) 10-325 MG tablet Take by mouth. (Patient taking differently: Take 1 tablet by mouth every 8 (eight) hours as needed for pain.)     Probiotic Product (PROBIOTIC PO) Take 1 tablet by mouth daily.     ranolazine  (RANEXA ) 500 MG 12 hr tablet Take 500 mg by mouth 2 (two) times daily.     [Paused] rivaroxaban  (XARELTO ) 20 MG TABS tablet Take 10 mg by mouth daily.     rosuvastatin  (CRESTOR ) 10 MG tablet Take 5 mg by mouth daily.     tamsulosin (FLOMAX) 0.4 MG CAPS capsule Take 0.4 mg by mouth daily after supper.     No current facility-administered medications for this visit.    REVIEW OF SYSTEMS:   Constitutional: Denies fevers, chills or abnormal weight loss Eyes: Denies blurriness of vision Ears, nose, mouth, throat, and face: Denies mucositis or sore throat Respiratory: Denies cough, dyspnea or wheezes Cardiovascular: Denies palpitation, chest discomfort or lower extremity swelling Gastrointestinal:  Denies nausea, heartburn or change in bowel habits Skin: Denies abnormal skin rashes Lymphatics: Denies new lymphadenopathy or easy bruising Neurological:Denies numbness, tingling or new weaknesses Behavioral/Psych: Mood is stable, no new changes  All other systems were reviewed with the patient and are negative.   VITALS:  Blood pressure (!) 82/67, pulse 100, temperature 97.9 F (36.6 C), temperature source Oral, resp. rate 18, SpO2 96%.  Wt Readings from Last 3 Encounters:  03/14/24 235 lb 3.7 oz (106.7 kg)  03/11/24 233 lb 11 oz (106 kg)  02/22/24 234 lb 1.6 oz (106.2 kg)    There is no height or weight on file to calculate BMI.  Performance status (ECOG): 2 - Symptomatic, <50% confined to bed  PHYSICAL EXAM:   GENERAL:alert, no distress and comfortable SKIN: skin color, texture, turgor are normal, no rashes or significant lesions LYMPH:  no palpable lymphadenopathy in the cervical, axillary or inguinal LUNGS: clear to auscultation and  percussion with normal breathing effort HEART: regular rate & rhythm and no murmurs and no lower extremity edema ABDOMEN:abdomen soft, non-tender and normal bowel sounds Musculoskeletal:no cyanosis of digits and no clubbing  NEURO: alert & oriented x 3 with fluent speech  LABORATORY DATA:  I have reviewed the data as listed  Lab Results  Component Value Date   WBC 3.2 (L) 03/28/2024   NEUTROABS 2.5 03/28/2024   HGB 12.9 (L) 03/28/2024   HCT 38.8 (L) 03/28/2024   MCV 96.0 03/28/2024   PLT  03/28/2024    PLATELET CLUMPS NOTED ON SMEAR, COUNT APPEARS DECREASED      Chemistry      Component Value Date/Time   NA 141 03/28/2024 1256   K 3.9 03/28/2024 1256   CL 104 03/28/2024 1256   CO2 25 03/28/2024 1256   BUN 25 (H) 03/28/2024 1256   CREATININE 1.27 (H) 03/28/2024 1256   CREATININE 0.94 03/01/2013 1503      Component Value Date/Time   CALCIUM  9.4 03/28/2024 1256   ALKPHOS 58 03/28/2024 1256   AST 21 03/28/2024 1256   ALT 17 03/28/2024 1256   BILITOT 0.9 03/28/2024 1256      Latest Reference Range & Units 03/28/24 12:56  Prostatic Specific Antigen 0.00 - 4.00 ng/mL 0.06   Lactate Dehydrogenase (LDH) 100 - 200 U/L 116  RADIOGRAPHIC STUDIES: I have personally reviewed the radiological images as listed and agreed with the findings in the report.  DG Knee Complete 4 Views Right CLINICAL DATA:  Status post fall.  EXAM: RIGHT KNEE - COMPLETE 4+ VIEW  COMPARISON:  None Available.  FINDINGS: No evidence of an acute fracture or dislocation. A small chronic deformity is seen along the dorsal aspect of the right patella. Mild tricompartmental degenerative changes are present. A small suprapatellar effusion is noted.  IMPRESSION: 1. Mild tricompartmental degenerative changes. 2. Small suprapatellar effusion.  Electronically Signed   By: Suzen Dials M.D.   On: 03/11/2024 13:31 CT Head Wo Contrast EXAM: CT HEAD WITHOUT CONTRAST 03/11/2024 12:49:11  PM  TECHNIQUE: CT of the head was performed without the administration of intravenous contrast. Automated exposure control, iterative reconstruction, and/or weight based adjustment of the mA/kV was utilized to reduce the radiation dose to as low as reasonably achievable.  COMPARISON: CT Head 12/11/2023.  CLINICAL HISTORY: 73 year old male. Evaluation for bleed in setting of fall and anticoagulation. Patient denies hitting head.  FINDINGS:  BRAIN AND VENTRICLES: Normal for age non contrast CT appearance of the brain. Stable cerebral volume. Gray white differentiation stable and within normal limits for age. Calcified atherosclerosis at the skull base. No suspicious intracranial vascular hyperdensity. No acute hemorrhage. No evidence of acute infarct. No hydrocephalus. No extra-axial collection. No mass effect or midline shift.  ORBITS: No acute orbital injury.  SINUSES: No acute abnormality.  SOFT TISSUES AND SKULL: No acute scalp soft tissue injury. No skull fracture.  IMPRESSION: 1. No acute traumatic injury identified 2. Normal for age non-contrast CT appearance of the brain.  Electronically signed by: Helayne Hurst MD 03/11/2024 12:55 PM EDT RP Workstation: [id number]

## 2024-04-08 ENCOUNTER — Ambulatory Visit (HOSPITAL_COMMUNITY)
Admission: RE | Admit: 2024-04-08 | Discharge: 2024-04-08 | Disposition: A | Discharge status: Home / Self Care | Source: Ambulatory Visit | Attending: Internal Medicine | Admitting: Internal Medicine

## 2024-04-08 DIAGNOSIS — M25561 Pain in right knee: Secondary | ICD-10-CM | POA: Insufficient documentation

## 2024-04-09 ENCOUNTER — Encounter (HOSPITAL_COMMUNITY): Payer: Self-pay | Admitting: Hematology and Oncology

## 2024-04-12 ENCOUNTER — Encounter (HOSPITAL_COMMUNITY)

## 2024-04-14 ENCOUNTER — Encounter (HOSPITAL_COMMUNITY)

## 2024-04-20 ENCOUNTER — Institutional Professional Consult (permissible substitution) (INDEPENDENT_AMBULATORY_CARE_PROVIDER_SITE_OTHER): Admitting: Otolaryngology

## 2024-05-30 ENCOUNTER — Encounter: Payer: Self-pay | Admitting: *Deleted

## 2024-09-27 ENCOUNTER — Inpatient Hospital Stay

## 2024-10-04 ENCOUNTER — Inpatient Hospital Stay: Admitting: Oncology
